# Patient Record
Sex: Female | Born: 1935 | Race: White | Hispanic: No | Smoking: Never smoker
Health system: Southern US, Community
[De-identification: ages and names within clinical notes are randomized; demographics above are authoritative.]

## PROBLEM LIST (undated history)

## (undated) DIAGNOSIS — K922 Gastrointestinal hemorrhage, unspecified: Secondary | ICD-10-CM

## (undated) DIAGNOSIS — D649 Anemia, unspecified: Secondary | ICD-10-CM

## (undated) DIAGNOSIS — I499 Cardiac arrhythmia, unspecified: Secondary | ICD-10-CM

## (undated) DIAGNOSIS — K5909 Other constipation: Secondary | ICD-10-CM

## (undated) DIAGNOSIS — I509 Heart failure, unspecified: Secondary | ICD-10-CM

## (undated) DIAGNOSIS — R41841 Cognitive communication deficit: Secondary | ICD-10-CM

## (undated) DIAGNOSIS — E785 Hyperlipidemia, unspecified: Secondary | ICD-10-CM

## (undated) DIAGNOSIS — H409 Unspecified glaucoma: Secondary | ICD-10-CM

## (undated) DIAGNOSIS — M549 Dorsalgia, unspecified: Secondary | ICD-10-CM

## (undated) DIAGNOSIS — G459 Transient cerebral ischemic attack, unspecified: Secondary | ICD-10-CM

## (undated) DIAGNOSIS — E039 Hypothyroidism, unspecified: Secondary | ICD-10-CM

## (undated) DIAGNOSIS — E079 Disorder of thyroid, unspecified: Secondary | ICD-10-CM

## (undated) DIAGNOSIS — I10 Essential (primary) hypertension: Secondary | ICD-10-CM

## (undated) DIAGNOSIS — Z5189 Encounter for other specified aftercare: Secondary | ICD-10-CM

## (undated) DIAGNOSIS — R55 Syncope and collapse: Secondary | ICD-10-CM

## (undated) DIAGNOSIS — G8929 Other chronic pain: Secondary | ICD-10-CM

## (undated) HISTORY — PX: COLONOSCOPY: SHX5424

## (undated) HISTORY — PX: CATARACT EXTRACTION, BILATERAL: SHX1313

---

## 1999-02-06 ENCOUNTER — Encounter: Admission: RE | Admit: 1999-02-06 | Discharge: 1999-02-06 | Payer: Self-pay | Admitting: Internal Medicine

## 1999-02-06 ENCOUNTER — Encounter: Payer: Self-pay | Admitting: Internal Medicine

## 1999-11-17 ENCOUNTER — Emergency Department (HOSPITAL_COMMUNITY): Admission: EM | Admit: 1999-11-17 | Discharge: 1999-11-17 | Payer: Self-pay | Admitting: Emergency Medicine

## 2000-02-18 ENCOUNTER — Encounter: Payer: Self-pay | Admitting: *Deleted

## 2000-02-18 ENCOUNTER — Emergency Department (HOSPITAL_COMMUNITY): Admission: EM | Admit: 2000-02-18 | Discharge: 2000-02-18 | Payer: Self-pay | Admitting: *Deleted

## 2000-05-20 ENCOUNTER — Other Ambulatory Visit: Admission: RE | Admit: 2000-05-20 | Discharge: 2000-05-20 | Payer: Self-pay | Admitting: Obstetrics and Gynecology

## 2007-05-28 ENCOUNTER — Encounter: Admission: RE | Admit: 2007-05-28 | Discharge: 2007-05-28 | Payer: Self-pay | Admitting: Family Medicine

## 2007-06-06 ENCOUNTER — Encounter: Admission: RE | Admit: 2007-06-06 | Discharge: 2007-06-06 | Payer: Self-pay | Admitting: Family Medicine

## 2007-12-01 ENCOUNTER — Emergency Department (HOSPITAL_COMMUNITY): Admission: EM | Admit: 2007-12-01 | Discharge: 2007-12-01 | Payer: Self-pay | Admitting: Emergency Medicine

## 2007-12-02 ENCOUNTER — Encounter: Admission: RE | Admit: 2007-12-02 | Discharge: 2007-12-02 | Payer: Self-pay | Admitting: Family Medicine

## 2008-04-02 ENCOUNTER — Inpatient Hospital Stay (HOSPITAL_COMMUNITY): Admission: RE | Admit: 2008-04-02 | Discharge: 2008-04-04 | Payer: Self-pay | Admitting: Gastroenterology

## 2008-04-03 ENCOUNTER — Ambulatory Visit: Payer: Self-pay | Admitting: Gastroenterology

## 2008-06-11 ENCOUNTER — Encounter: Admission: RE | Admit: 2008-06-11 | Discharge: 2008-06-11 | Payer: Self-pay | Admitting: Family Medicine

## 2008-06-24 ENCOUNTER — Encounter: Admission: RE | Admit: 2008-06-24 | Discharge: 2008-06-24 | Payer: Self-pay | Admitting: Family Medicine

## 2009-08-29 ENCOUNTER — Emergency Department (HOSPITAL_COMMUNITY): Admission: EM | Admit: 2009-08-29 | Discharge: 2009-08-29 | Payer: Self-pay | Admitting: Emergency Medicine

## 2010-05-04 LAB — BASIC METABOLIC PANEL
BUN: 6 mg/dL (ref 6–23)
CO2: 31 mEq/L (ref 19–32)
Calcium: 7.9 mg/dL — ABNORMAL LOW (ref 8.4–10.5)
Calcium: 8 mg/dL — ABNORMAL LOW (ref 8.4–10.5)
Chloride: 107 mEq/L (ref 96–112)
Creatinine, Ser: 0.59 mg/dL (ref 0.4–1.2)
Creatinine, Ser: 0.64 mg/dL (ref 0.4–1.2)
GFR calc Af Amer: >60 mL/min (ref >60)
GFR calc Af Amer: >60 mL/min (ref >60)
GFR calc non Af Amer: >60 mL/min (ref >60)
Glucose, Bld: 128 mg/dL — ABNORMAL HIGH (ref 70–99)
Glucose, Bld: 97 mg/dL (ref 70–99)
Potassium: 3.1 mEq/L — ABNORMAL LOW (ref 3.5–5.1)
Sodium: 137 mEq/L (ref 135–145)
Sodium: 138 mEq/L (ref 135–145)

## 2010-05-04 LAB — CBC
HCT: 24.1 % — ABNORMAL LOW (ref 36.0–46.0)
HCT: 26.7 % — ABNORMAL LOW (ref 36.0–46.0)
HCT: 28.5 % — ABNORMAL LOW (ref 36.0–46.0)
Hemoglobin: 8.2 g/dL — ABNORMAL LOW (ref 12.0–15.0)
Hemoglobin: 8.5 g/dL — ABNORMAL LOW (ref 12.0–15.0)
Hemoglobin: 9.1 g/dL — ABNORMAL LOW (ref 12.0–15.0)
MCHC: 34.1 g/dL (ref 30.0–36.0)
MCV: 84.4 fL (ref 78.0–100.0)
MCV: 84.5 fL (ref 78.0–100.0)
MCV: 84.6 fL (ref 78.0–100.0)
Platelets: 146 10*3/uL — ABNORMAL LOW (ref 150–400)
Platelets: 159 10*3/uL (ref 150–400)
Platelets: 191 10*3/uL (ref 150–400)
RBC: 2.85 MIL/uL — ABNORMAL LOW (ref 3.87–5.11)
RBC: 2.87 MIL/uL — ABNORMAL LOW (ref 3.87–5.11)
RBC: 2.97 MIL/uL — ABNORMAL LOW (ref 3.87–5.11)
RBC: 3.18 MIL/uL — ABNORMAL LOW (ref 3.87–5.11)
RBC: 3.4 MIL/uL — ABNORMAL LOW (ref 3.87–5.11)
RDW: 14.3 % (ref 11.5–15.5)
RDW: 14.4 % (ref 11.5–15.5)
WBC: 5 10*3/uL (ref 4.0–10.5)
WBC: 5.9 10*3/uL (ref 4.0–10.5)
WBC: 6 10*3/uL (ref 4.0–10.5)
WBC: 6 10*3/uL (ref 4.0–10.5)
WBC: 6.3 10*3/uL (ref 4.0–10.5)

## 2010-05-04 LAB — CROSSMATCH: Antibody Screen: NEGATIVE

## 2010-05-04 LAB — ABO/RH: ABO/RH(D): O POS

## 2010-10-24 LAB — COMPREHENSIVE METABOLIC PANEL
ALT: 17
Alkaline Phosphatase: 82
CO2: 32
GFR calc non Af Amer: 58 — ABNORMAL LOW
Glucose, Bld: 118 — ABNORMAL HIGH
Potassium: 3.7
Sodium: 138

## 2010-10-24 LAB — URINALYSIS, ROUTINE W REFLEX MICROSCOPIC
Bilirubin Urine: NEGATIVE
Nitrite: NEGATIVE
Specific Gravity, Urine: 1.005
pH: 6

## 2010-10-24 LAB — DIFFERENTIAL
Basophils Relative: 1
Eosinophils Absolute: 0.1
Neutrophils Relative %: 67

## 2010-10-24 LAB — CBC
Hemoglobin: 13
RBC: 4.65

## 2011-11-20 HISTORY — PX: JOINT REPLACEMENT: SHX530

## 2011-12-23 ENCOUNTER — Emergency Department (HOSPITAL_COMMUNITY)
Admission: EM | Admit: 2011-12-23 | Discharge: 2011-12-23 | Disposition: A | Discharge status: Home / Self Care | Payer: Medicare Other | Attending: Emergency Medicine | Admitting: Emergency Medicine

## 2011-12-23 ENCOUNTER — Emergency Department (HOSPITAL_COMMUNITY): Payer: Medicare Other

## 2011-12-23 ENCOUNTER — Encounter (HOSPITAL_COMMUNITY): Payer: Self-pay | Admitting: Emergency Medicine

## 2011-12-23 DIAGNOSIS — M79609 Pain in unspecified limb: Secondary | ICD-10-CM

## 2011-12-23 DIAGNOSIS — G8918 Other acute postprocedural pain: Secondary | ICD-10-CM | POA: Insufficient documentation

## 2011-12-23 DIAGNOSIS — Z96659 Presence of unspecified artificial knee joint: Secondary | ICD-10-CM | POA: Insufficient documentation

## 2011-12-23 DIAGNOSIS — M25569 Pain in unspecified knee: Secondary | ICD-10-CM | POA: Insufficient documentation

## 2011-12-23 HISTORY — DX: Encounter for other specified aftercare: Z51.89

## 2011-12-23 LAB — CBC WITH DIFFERENTIAL/PLATELET
Basophils Absolute: 0.1 10*3/uL (ref 0.0–0.1)
Basophils Relative: 1 % (ref 0–1)
Lymphocytes Relative: 19 % (ref 12–46)
MCHC: 32.7 g/dL (ref 30.0–36.0)
Neutro Abs: 5.3 10*3/uL (ref 1.7–7.7)
Neutrophils Relative %: 71 % (ref 43–77)
RDW: 14.3 % (ref 11.5–15.5)
WBC: 7.4 10*3/uL (ref 4.0–10.5)

## 2011-12-23 LAB — SEDIMENTATION RATE: Sed Rate: 34 mm/hr — ABNORMAL HIGH (ref 0–22)

## 2011-12-23 MED ORDER — OXYCODONE-ACETAMINOPHEN 5-325 MG PO TABS: 1.0000 | ORAL_TABLET | Freq: Four times a day (QID) | ORAL | Status: DC | PRN

## 2011-12-23 NOTE — ED Notes (Signed)
md at bedside  Pt alert and oriented x4. Respirations even and unlabored, bilateral symmetrical rise and fall of chest. Skin warm and dry. In no acute distress. Denies needs.   

## 2011-12-23 NOTE — ED Notes (Signed)
Pt escorted to discharge window. Pt verbalized understanding discharge instructions. In no acute distress.  

## 2011-12-23 NOTE — ED Provider Notes (Signed)
History     CSN: [id number]  Arrival date & time 12/23/11  9562   First MD Initiated Contact with Patient 12/23/11 [phone]      Chief Complaint  Patient presents with  . Post-op Problem    (Consider location/radiation/quality/duration/timing/severity/associated sxs/prior treatment) The history is provided by the patient.   patient presents with right lower extremity pain. She had a left total knee replacement in [place] the end of October. She's been uneventful and mandatory since. She went to the movies on Friday and noticed that her stockings rolled onto the lobe in the knee. She states it was red in that area. She's had pain since then. No fevers. No cough. No trouble breathing. No trauma. She did not twist her knee. No swelling of the leg. There is some redness of the knee area. All she is taking for it has been Tylenol. The pain is worse with movement and resolves with rest.  Past Medical History  Diagnosis Date  . Blood transfusion without reported diagnosis     Past Surgical History  Procedure Date  . Joint replacement 11/20/2011    LTKR    History reviewed. No pertinent family history.  History  Substance Use Topics  . Smoking status: Never Smoker   . Smokeless tobacco: Never Used  . Alcohol Use: No    OB History    Grav Para Term Preterm Abortions TAB SAB Ect Mult Living                  Review of Systems  Constitutional: Negative for activity change and appetite change.  HENT: Negative for neck stiffness.   Eyes: Negative for pain.  Respiratory: Negative for chest tightness and shortness of breath.   Cardiovascular: Negative for chest pain and leg swelling.  Gastrointestinal: Negative for nausea, vomiting, abdominal pain and diarrhea.  Genitourinary: Negative for flank pain.  Musculoskeletal: Negative for myalgias, back pain and gait problem.  Skin: Positive for color change. Negative for rash.  Neurological: Negative for weakness, numbness and  headaches.  Psychiatric/Behavioral: Negative for behavioral problems.    Allergies  Review of patient's allergies indicates no known allergies.  Home Medications   Current Outpatient Rx  Name  Route  Sig  Dispense  Refill  . ACETAMINOPHEN 500 MG PO TABS   Oral   Take 1,000 mg by mouth 2 (two) times daily. For pain         . CALCIUM CARBONATE-VITAMIN D 500-200 MG-UNIT PO TABS   Oral   Take 1 tablet by mouth every morning.         Marland Kitchen LATANOPROST 0.005 % OP SOLN   Both Eyes   Place 1 drop into both eyes at bedtime.         Marland Kitchen LEVOTHYROXINE SODIUM 100 MCG PO TABS   Oral   Take 100 mcg by mouth daily before breakfast.         . ADULT MULTIVITAMIN W/MINERALS CH   Oral   Take 1 tablet by mouth every morning.         Marland Kitchen TIMOLOL MALEATE 0.5 % OP SOLN   Both Eyes   Place 1 drop into both eyes 2 (two) times daily.         . OXYCODONE-ACETAMINOPHEN 5-325 MG PO TABS   Oral   Take 1-2 tablets by mouth every 6 (six) hours as needed for pain.   10 tablet   0     BP 125/72  Pulse 94  Temp  97.3 F (36.3 C) (Oral)  Resp 20  SpO2 97%  Physical Exam  Constitutional: She appears well-developed and well-nourished.  HENT:  Head: Normocephalic.  Eyes: Pupils are equal, round, and reactive to light.  Cardiovascular: Normal rate and regular rhythm.   Pulmonary/Chest: Effort normal and breath sounds normal.  Abdominal: Soft. There is no tenderness.  Musculoskeletal:       Range of motion intact in left knee. Mild erythematous skin without induration. Mild pain with varus and valgus strain. Capillary refill is symmetric on feet. Dorsalis pedis pulse palpated. Sensation intact. The 2 small areas of ecchymosis proximal to the left knee medially. No tenderness to knee.  Skin: Skin is warm.    ED Course  Procedures (including critical care time)  Labs Reviewed  SEDIMENTATION RATE - Abnormal; Notable for the following:    Sed Rate 34 (*)     All other components within  normal limits  CBC WITH DIFFERENTIAL   Dg Knee Complete 4 Views Left  12/23/2011  *RADIOLOGY REPORT*  Clinical Data: 1 month post left total knee arthroplasty, presenting with medial left knee pain.  LEFT KNEE - COMPLETE 4+ VIEW  Comparison: Preoperative left knee x-rays 06/24/2008, 05/13/2006.  Findings: Left total knee arthroplasty with anatomic alignment.  No evidence of prosthetic loosening.  No evidence of acute fracture. Small joint effusion.  IMPRESSION: Anatomic alignment post left total knee arthroplasty without complicating features.  No acute osseous abnormality.  Small joint effusion.   Original Report Authenticated By: Hulan Saas, M.D.      1. Knee pain       MDM  Patient presents with pain to her replaced left knee. No fevers. No white count. Her sedimentation rate is mildly elevated. Joint is not irritable. Negative Doppler. X-ray is reassuring. Patient be discharged with pain meds to follow with her orthopedist as needed        Juliet Rude. Rubin Payor, MD 12/23/11 1134

## 2011-12-23 NOTE — Progress Notes (Signed)
VASCULAR LAB PRELIMINARY  PRELIMINARY  PRELIMINARY  PRELIMINARY  Left lower extremity venous Doppler completed.    Preliminary report:  There is no obvious evidence of DVT or SVT noted in the left lower extremity.  Geraldin Habermehl, 12/23/2011, 10:07 AM

## 2011-12-23 NOTE — ED Notes (Signed)
Pt presents w/ left knee pain, post-op left TKR on 10/29. Uneventful post-op period, went to theater on Friday, TED stocking rolled down to below left knee and now has unbearable pain to inner aspect of left knee. Feels nauseated d/t pain and inability to sleep. Denies chest pain or shortness of breath.

## 2012-02-02 ENCOUNTER — Encounter (HOSPITAL_COMMUNITY): Payer: Self-pay | Admitting: *Deleted

## 2012-02-02 ENCOUNTER — Emergency Department (HOSPITAL_COMMUNITY)
Admission: EM | Admit: 2012-02-02 | Discharge: 2012-02-03 | Disposition: A | Discharge status: Home / Self Care | Payer: Medicare Other | Attending: Emergency Medicine | Admitting: Emergency Medicine

## 2012-02-02 DIAGNOSIS — R55 Syncope and collapse: Secondary | ICD-10-CM | POA: Insufficient documentation

## 2012-02-02 DIAGNOSIS — Z79899 Other long term (current) drug therapy: Secondary | ICD-10-CM | POA: Insufficient documentation

## 2012-02-02 DIAGNOSIS — R112 Nausea with vomiting, unspecified: Secondary | ICD-10-CM | POA: Insufficient documentation

## 2012-02-02 LAB — POCT I-STAT TROPONIN I: Troponin i, poc: 0 ng/mL (ref 0.00–0.08)

## 2012-02-02 LAB — POCT I-STAT, CHEM 8
BUN: 17 mg/dL (ref 6–23)
Chloride: 105 mEq/L (ref 96–112)
HCT: 34 % — ABNORMAL LOW (ref 36.0–46.0)
Potassium: 3.5 mEq/L (ref 3.5–5.1)
Sodium: 141 mEq/L (ref 135–145)

## 2012-02-02 MED ORDER — SODIUM CHLORIDE 0.9 % IV BOLUS (SEPSIS)
1000.0000 mL | Freq: Once | INTRAVENOUS | Status: AC
  Administered 2012-02-02: 1000 mL via INTRAVENOUS

## 2012-02-02 MED ORDER — ONDANSETRON HCL 4 MG/2ML IJ SOLN
4.0000 mg | Freq: Once | INTRAMUSCULAR | Status: AC
  Administered 2012-02-02: 4 mg via INTRAVENOUS
  Filled 2012-02-02: qty 2

## 2012-02-02 MED ORDER — SODIUM CHLORIDE 0.9 % IV SOLN
INTRAVENOUS | Status: DC
  Administered 2012-02-02: 22:00:00 via INTRAVENOUS

## 2012-02-02 NOTE — ED Notes (Signed)
Pt had a syncopal episode at church. RN on scene said pt was unresponsive for about 5 seconds. EMS states that pt was A&Ox4 when they arrived. Pt initial BP was elevated but throughout transport pt BP dropping. Pt vomited in route and was given 4 mg of zofran. Pt also diaphroetic upon EMS arrival

## 2012-02-02 NOTE — ED Notes (Signed)
At first, EKG was unattainable due to equipment mafunction. It was successful using the portable EKG monitor. EKG given to Dr. Fonnie Jarvis and copy placed in pt chart.

## 2012-02-02 NOTE — ED Provider Notes (Signed)
History     CSN: [id number]  Arrival date & time 02/02/12  2105   First MD Initiated Contact with Patient 02/02/12 2111      Chief Complaint  Patient presents with  . Loss of Consciousness    (Consider location/radiation/quality/duration/timing/severity/associated sxs/prior treatment) HPI This 79 year old was at church this evening when she gradual onset of feeling lightheaded like she was going to faint, she felt warm nauseated sweaty and knew she was going to pass out, she had brief witnessed atraumatic syncope for several seconds, she had no seizure-like activity, she had no chest pain palpations or shortness breath, she woke up with no trauma, she had no headache neck pain back pain chest pain shortness of breath palpitations. She is no focal weakness numbness or incoordination and no change in speech vision swallowing or understanding. She feels much better but still has some nausea after receiving Zofran from EMS, she did vomit once after waking up. She did not have sudden syncope without warning. Her episode occurred just prior to arrival. Past Medical History  Diagnosis Date  . Blood transfusion without reported diagnosis     Past Surgical History  Procedure Date  . Joint replacement 11/20/2011    LTKR    History reviewed. No pertinent family history.  History  Substance Use Topics  . Smoking status: Never Smoker   . Smokeless tobacco: Never Used  . Alcohol Use: No    OB History    Grav Para Term Preterm Abortions TAB SAB Ect Mult Living                  Review of Systems 10 Systems reviewed and are negative for acute change except as noted in the HPI. Allergies  Review of patient's allergies indicates no known allergies.  Home Medications   Current Outpatient Rx  Name  Route  Sig  Dispense  Refill  . ACETAMINOPHEN 500 MG PO TABS   Oral   Take 1,000 mg by mouth 2 (two) times daily. For pain         . CALCIUM CARBONATE-VITAMIN D 500-200 MG-UNIT PO  TABS   Oral   Take 1 tablet by mouth every morning.         Marland Kitchen LATANOPROST 0.005 % OP SOLN   Both Eyes   Place 1 drop into both eyes at bedtime.         Marland Kitchen LEVOTHYROXINE SODIUM 100 MCG PO TABS   Oral   Take 100 mcg by mouth daily before breakfast.         . ADULT MULTIVITAMIN W/MINERALS CH   Oral   Take 1 tablet by mouth every morning.         Marland Kitchen ONDANSETRON HCL 8 MG PO TABS   Oral   Take 8 mg by mouth every 8 (eight) hours as needed. For nausea         . TIMOLOL MALEATE 0.5 % OP SOLN   Both Eyes   Place 1 drop into both eyes 2 (two) times daily.           BP 111/59  Pulse 82  Temp 97.5 F (36.4 C) (Oral)  Resp 16  SpO2 96%  Physical Exam  Nursing note and vitals reviewed. Constitutional:       Awake, alert, nontoxic appearance with baseline speech for patient.  HENT:  Head: Atraumatic.  Mouth/Throat: No oropharyngeal exudate.  Eyes: EOM are normal. Pupils are equal, round, and reactive to light. Right eye exhibits  no discharge. Left eye exhibits no discharge.  Neck: Neck supple.  Cardiovascular: Normal rate and regular rhythm.   No murmur heard. Pulmonary/Chest: Effort normal and breath sounds normal. No stridor. No respiratory distress. She has no wheezes. She has no rales. She exhibits no tenderness.  Abdominal: Soft. Bowel sounds are normal. She exhibits no mass. There is no tenderness. There is no rebound.  Musculoskeletal: She exhibits no tenderness.       Baseline ROM, moves extremities with no obvious new focal weakness.  Lymphadenopathy:    She has no cervical adenopathy.  Neurological: She is alert.       Awake, alert, cooperative and aware of situation; motor strength bilaterally; sensation normal to light touch bilaterally; peripheral visual fields full to confrontation; no facial asymmetry; tongue midline; major cranial nerves appear intact; no pronator drift, normal finger to nose bilaterally  Skin: No rash noted.  Psychiatric: She has a  normal mood and affect.    ED Course  Procedures (including critical care time) ECG: Normal sinus rhythm, ventricular rate 85, normal axis, prolonged QTC at 502 ms, no acute ischemic changes noted, artifact present, no comparison ECG immediately available Labs Reviewed  POCT I-STAT, CHEM 8 - Abnormal; Notable for the following:    Glucose, Bld 126 (*)     Hemoglobin 11.6 (*)     HCT 34.0 (*)     All other components within normal limits  POCT I-STAT TROPONIN I  POCT I-STAT TROPONIN I  LAB REPORT - SCANNED   No results found.   1. Syncope       MDM  Patient / Family / Caregiver informed of clinical course, understand medical decision-making process, and agree with plan.  I doubt any other EMC precluding discharge at this time including, but not necessarily limited to the following:Vtach, brady-dysrhythmia.         Hurman Horn, MD 02/03/12 [phone]

## 2012-02-03 LAB — POCT I-STAT TROPONIN I: Troponin i, poc: 0 ng/mL (ref 0.00–0.08)

## 2012-08-06 ENCOUNTER — Emergency Department (HOSPITAL_COMMUNITY): Payer: Medicare Other

## 2012-08-06 ENCOUNTER — Encounter (HOSPITAL_COMMUNITY): Payer: Self-pay | Admitting: Emergency Medicine

## 2012-08-06 ENCOUNTER — Emergency Department (HOSPITAL_COMMUNITY)
Admission: EM | Admit: 2012-08-06 | Discharge: 2012-08-06 | Disposition: A | Discharge status: Home / Self Care | Payer: Medicare Other | Attending: Emergency Medicine | Admitting: Emergency Medicine

## 2012-08-06 DIAGNOSIS — S335XXA Sprain of ligaments of lumbar spine, initial encounter: Secondary | ICD-10-CM | POA: Insufficient documentation

## 2012-08-06 DIAGNOSIS — Y939 Activity, unspecified: Secondary | ICD-10-CM | POA: Insufficient documentation

## 2012-08-06 DIAGNOSIS — S39012A Strain of muscle, fascia and tendon of lower back, initial encounter: Secondary | ICD-10-CM

## 2012-08-06 DIAGNOSIS — S79919A Unspecified injury of unspecified hip, initial encounter: Secondary | ICD-10-CM | POA: Insufficient documentation

## 2012-08-06 DIAGNOSIS — X58XXXA Exposure to other specified factors, initial encounter: Secondary | ICD-10-CM | POA: Insufficient documentation

## 2012-08-06 DIAGNOSIS — Y929 Unspecified place or not applicable: Secondary | ICD-10-CM | POA: Insufficient documentation

## 2012-08-06 DIAGNOSIS — Z79899 Other long term (current) drug therapy: Secondary | ICD-10-CM | POA: Insufficient documentation

## 2012-08-06 LAB — BASIC METABOLIC PANEL
BUN: 15 mg/dL (ref 6–23)
CO2: 31 mEq/L (ref 19–32)
Calcium: 9.4 mg/dL (ref 8.4–10.5)
Chloride: 97 mEq/L (ref 96–112)
Creatinine, Ser: 0.7 mg/dL (ref 0.50–1.10)
GFR calc Af Amer: >90 mL/min (ref >90)
GFR calc non Af Amer: 79 mL/min — ABNORMAL LOW (ref >90)
Glucose, Bld: 107 mg/dL — ABNORMAL HIGH (ref 70–99)
Potassium: 3.7 mEq/L (ref 3.5–5.1)
Sodium: 136 mEq/L (ref 135–145)

## 2012-08-06 LAB — URINALYSIS, ROUTINE W REFLEX MICROSCOPIC
Bilirubin Urine: NEGATIVE
Glucose, UA: NEGATIVE mg/dL
Hgb urine dipstick: NEGATIVE
Ketones, ur: NEGATIVE mg/dL
Nitrite: NEGATIVE
Protein, ur: NEGATIVE mg/dL
Specific Gravity, Urine: 1.017 (ref 1.005–1.030)
Urobilinogen, UA: 0.2 mg/dL (ref 0.0–1.0)
pH: 5.5 (ref 5.0–8.0)

## 2012-08-06 LAB — CBC
HCT: 37.3 % (ref 36.0–46.0)
Hemoglobin: 12.4 g/dL (ref 12.0–15.0)
MCH: 27.9 pg (ref 26.0–34.0)
MCHC: 33.2 g/dL (ref 30.0–36.0)
MCV: 84 fL (ref 78.0–100.0)
Platelets: 180 10*3/uL (ref 150–400)
RBC: 4.44 MIL/uL (ref 3.87–5.11)
RDW: 13.5 % (ref 11.5–15.5)
WBC: 8.8 10*3/uL (ref 4.0–10.5)

## 2012-08-06 LAB — URINE MICROSCOPIC-ADD ON

## 2012-08-06 MED ORDER — HYDROCODONE-ACETAMINOPHEN 5-325 MG PO TABS: 1.0000 | ORAL_TABLET | Freq: Four times a day (QID) | ORAL | Status: DC | PRN

## 2012-08-06 MED ORDER — IBUPROFEN 800 MG PO TABS: 800.0000 mg | ORAL_TABLET | Freq: Three times a day (TID) | ORAL | Status: DC | PRN

## 2012-08-06 NOTE — ED Notes (Signed)
Pt states she is not having any hip or back pain unless she moves. Pt resting quietly in bed with family at bedside.

## 2012-08-06 NOTE — ED Notes (Signed)
PA Lawyer at bedside.  

## 2012-08-06 NOTE — ED Notes (Signed)
Pt complains of pain to right hip ans back pain x 3 days. Pt also complains of nausea at this time.

## 2012-08-07 NOTE — ED Provider Notes (Signed)
History    CSN: [id number] Arrival date & time 08/06/12  1654  First MD Initiated Contact with Patient 08/06/12 1733     Chief Complaint  Patient presents with  . Hip Pain   (Consider location/radiation/quality/duration/timing/severity/associated sxs/prior Treatment) HPI Patient presents to the emergency department with right lower back pain that radiates to her hip.  Patient, states this started 4 days, ago.  Patient denies numbness, weakness, nausea, vomiting, abdominal pain, fever, chest pain, shortness of breath, headache, rash or syncope.  Patient, states she was taking tramadol without relief of her pain.  Patient, states, that movement makes her pain, worse.  Patient, states nothing seems to make her pain, better.  Patient, states her symptoms have been constant Past Medical History  Diagnosis Date  . Blood transfusion without reported diagnosis    Past Surgical History  Procedure Laterality Date  . Joint replacement  11/20/2011    LTKR   No family history on file. History  Substance Use Topics  . Smoking status: Never Smoker   . Smokeless tobacco: Never Used  . Alcohol Use: No   OB History   Grav Para Term Preterm Abortions TAB SAB Ect Mult Living                 Review of Systems All other systems negative except as documented in the HPI. All pertinent positives and negatives as reviewed in the HPI. Allergies  Codeine  Home Medications   Current Outpatient Rx  Name  Route  Sig  Dispense  Refill  . calcium-vitamin D (OSCAL WITH D) 500-200 MG-UNIT per tablet   Oral   Take 1 tablet by mouth every morning.         . latanoprost (XALATAN) 0.005 % ophthalmic solution   Both Eyes   Place 1 drop into both eyes at bedtime.         Marland Kitchen levothyroxine (SYNTHROID, LEVOTHROID) 100 MCG tablet   Oral   Take 100 mcg by mouth daily before breakfast.         . Multiple Vitamin (MULTIVITAMIN WITH MINERALS) TABS   Oral   Take 1 tablet by mouth every morning.        . timolol (TIMOPTIC) 0.5 % ophthalmic solution   Both Eyes   Place 1 drop into both eyes 2 (two) times daily.         . traMADol (ULTRAM) 50 MG tablet   Oral   Take 50 mg by mouth every 6 (six) hours as needed for pain.         Marland Kitchen HYDROcodone-acetaminophen (NORCO/VICODIN) 5-325 MG per tablet   Oral   Take 1 tablet by mouth every 6 (six) hours as needed for pain.   15 tablet   0   . ibuprofen (ADVIL,MOTRIN) 800 MG tablet   Oral   Take 1 tablet (800 mg total) by mouth every 8 (eight) hours as needed for pain.   21 tablet   0    BP 131/81  Pulse 96  Temp(Src) 98.1 F (36.7 C) (Oral)  Resp 20  SpO2 96% Physical Exam  Constitutional: She is oriented to person, place, and time. She appears well-developed and well-nourished.  HENT:  Head: Normocephalic and atraumatic.  Mouth/Throat: Oropharynx is clear and moist.  Eyes: Pupils are equal, round, and reactive to light.  Cardiovascular: Normal rate and normal heart sounds.   Pulmonary/Chest: Effort normal and breath sounds normal.  Musculoskeletal:       Lumbar back: She  exhibits tenderness and pain. She exhibits normal range of motion, no deformity and no spasm.       Back:       Legs: Neurological: She is alert and oriented to person, place, and time. She has normal reflexes. She exhibits normal muscle tone. Coordination normal.    ED Course  Procedures (including critical care time) Labs Reviewed  URINALYSIS, ROUTINE W REFLEX MICROSCOPIC - Abnormal; Notable for the following:    Leukocytes, UA SMALL (*)    All other components within normal limits  BASIC METABOLIC PANEL - Abnormal; Notable for the following:    Glucose, Bld 107 (*)    GFR calc non Af Amer 79 (*)    All other components within normal limits  CBC  URINE MICROSCOPIC-ADD ON   Dg Lumbar Spine Complete  08/06/2012   *RADIOLOGY REPORT*  Clinical Data: Hip pain  LUMBAR SPINE - COMPLETE 4+ VIEW  Comparison: None.  Findings: There is a normal alignment  of the lumbar spine.  A compression fracture involves the L3 vertebra.  There is loss of approximately 50% of the vertebral body height.  Mild superior endplate compression deformity involves the L4 vertebra.  This is age indeterminate.  There may also be a compression fracture involving the T8 vertebra. This is also age indeterminate.  IMPRESSION:  1.  The predominant finding is a compression fracture involving the L 30 vertebra with loss of approximately 50% of the vertebral body height.   Mild age indeterminant compression deformities involve the T8 vertebra and L4.   Original Report Authenticated By: Signa Kell, M.D.   Dg Hip Complete Right  08/06/2012   *RADIOLOGY REPORT*  Clinical Data: Hip pain  RIGHT HIP - COMPLETE 2+ VIEW  Comparison: None  Findings: There are moderate degenerative changes involving the right hip.  Joint space narrowing, marginal spur formation and subchondral sclerosis is noted.  No fracture or subluxation identified.  No radio-opaque foreign body or soft tissue calcifications identified.  Degenerative disc disease is noted within the lumbar spine.  IMPRESSION:  1.  Osteoarthritis involves the right hip. 2.  No acute findings.   Original Report Authenticated By: Signa Kell, M.D.    patient states, that she had a compression fracture 12 years ago so she states this is not a new finding.  The patient does not have any motor or neurological deficits noted on exam.  She does have normal reflexes.  Patient is advised followup with her primary care Dr. told to use ice and heat on her lower back.  Told to return here for any worsening in her condition  MDM    Carlyle Dolly, PA-C 08/07/12 0110

## 2012-08-08 NOTE — ED Provider Notes (Signed)
Medical screening examination/treatment/procedure(s) were performed by non-physician practitioner and as supervising physician I was immediately available for consultation/collaboration.  Chamille Werntz, MD 08/08/12 1455 

## 2012-10-02 ENCOUNTER — Encounter (HOSPITAL_COMMUNITY): Payer: Self-pay | Admitting: Emergency Medicine

## 2012-10-02 ENCOUNTER — Emergency Department (HOSPITAL_COMMUNITY)
Admission: EM | Admit: 2012-10-02 | Discharge: 2012-10-02 | Disposition: A | Discharge status: Home / Self Care | Payer: Medicare Other | Attending: Emergency Medicine | Admitting: Emergency Medicine

## 2012-10-02 DIAGNOSIS — M25551 Pain in right hip: Secondary | ICD-10-CM

## 2012-10-02 DIAGNOSIS — Z79899 Other long term (current) drug therapy: Secondary | ICD-10-CM | POA: Insufficient documentation

## 2012-10-02 DIAGNOSIS — Z8781 Personal history of (healed) traumatic fracture: Secondary | ICD-10-CM | POA: Insufficient documentation

## 2012-10-02 DIAGNOSIS — M545 Low back pain, unspecified: Secondary | ICD-10-CM | POA: Insufficient documentation

## 2012-10-02 DIAGNOSIS — M25559 Pain in unspecified hip: Secondary | ICD-10-CM | POA: Insufficient documentation

## 2012-10-02 DIAGNOSIS — Z880 Allergy status to penicillin: Secondary | ICD-10-CM | POA: Insufficient documentation

## 2012-10-02 DIAGNOSIS — M549 Dorsalgia, unspecified: Secondary | ICD-10-CM

## 2012-10-02 LAB — URINALYSIS, ROUTINE W REFLEX MICROSCOPIC
Bilirubin Urine: NEGATIVE
Ketones, ur: NEGATIVE mg/dL
Nitrite: NEGATIVE
Protein, ur: NEGATIVE mg/dL
Urobilinogen, UA: 0.2 mg/dL (ref 0.0–1.0)

## 2012-10-02 LAB — URINE MICROSCOPIC-ADD ON: Urine-Other: NONE SEEN

## 2012-10-02 MED ORDER — HYDROCODONE-ACETAMINOPHEN 5-325 MG PO TABS
1.0000 | ORAL_TABLET | Freq: Once | ORAL | Status: AC
  Administered 2012-10-02: 1 via ORAL
  Filled 2012-10-02: qty 1

## 2012-10-02 MED ORDER — IBUPROFEN 400 MG PO TABS: 400.0000 mg | ORAL_TABLET | Freq: Three times a day (TID) | ORAL | Status: DC | PRN

## 2012-10-02 MED ORDER — HYDROCODONE-ACETAMINOPHEN 5-325 MG PO TABS: 1.0000 | ORAL_TABLET | Freq: Four times a day (QID) | ORAL | Status: DC | PRN

## 2012-10-02 NOTE — ED Provider Notes (Signed)
CSN: [id number]     Arrival date & time 10/02/12  1603 History   First MD Initiated Contact with Patient 10/02/12 1800     No chief complaint on file.  (Consider location/radiation/quality/duration/timing/severity/associated sxs/prior Treatment) HPI Comments: Patient with low back pain with radiation into her right hip.  Patient has a hx of remote spinal fracture 12 years ago, typically does not have pain with this.  Two months ago her pain returned, was seen in ED.  Symptoms controlled with prescribed medication and resolved. Pain returned 3 days ago.  Pain is described a gnawing, exacerbated by trying to get up from lying flat, improved with ibuprofen and hydrocodone.  Pt has been limiting her use of ibuprofen to 1 - 800mg  tablet daily because she has a hx GI bleed from NSAID overuse.  Pain is then uncontrolled when the ibuprofen wears off.  Denies fevers, chills, abdominal pain, urinary symptoms, bloody stool, bowel or bladder incontinence or retention, weakness or numbness in her legs, rash.   The history is provided by the patient.    Past Medical History  Diagnosis Date  . Blood transfusion without reported diagnosis    Past Surgical History  Procedure Laterality Date  . Joint replacement  11/20/2011    LTKR   No family history on file. History  Substance Use Topics  . Smoking status: Never Smoker   . Smokeless tobacco: Never Used  . Alcohol Use: No   OB History   Grav Para Term Preterm Abortions TAB SAB Ect Mult Living                 Review of Systems  Constitutional: Negative for fever.  Gastrointestinal: Negative for nausea, vomiting, abdominal pain, diarrhea and blood in stool.  Genitourinary: Negative for dysuria, urgency, frequency, hematuria and vaginal bleeding.  Musculoskeletal: Positive for back pain.  Neurological: Negative for weakness and numbness.    Allergies  Penicillins and Codeine  Home Medications   Current Outpatient Rx  Name  Route  Sig   Dispense  Refill  . calcium-vitamin D (OSCAL WITH D) 500-200 MG-UNIT per tablet   Oral   Take 1 tablet by mouth every morning.         Marland Kitchen HYDROcodone-acetaminophen (NORCO/VICODIN) 5-325 MG per tablet   Oral   Take 1 tablet by mouth every 6 (six) hours as needed for pain.   15 tablet   0   . ibuprofen (ADVIL,MOTRIN) 800 MG tablet   Oral   Take 800 mg by mouth every 8 (eight) hours as needed for pain.         Marland Kitchen latanoprost (XALATAN) 0.005 % ophthalmic solution   Both Eyes   Place 1 drop into both eyes at bedtime.         Marland Kitchen levothyroxine (SYNTHROID, LEVOTHROID) 100 MCG tablet   Oral   Take 100 mcg by mouth daily before breakfast.         . Multiple Vitamin (MULTIVITAMIN WITH MINERALS) TABS   Oral   Take 1 tablet by mouth every morning.         . timolol (TIMOPTIC) 0.5 % ophthalmic solution   Both Eyes   Place 1 drop into both eyes 2 (two) times daily.          There were no vitals taken for this visit. Physical Exam  Nursing note and vitals reviewed. Constitutional: She appears well-developed and well-nourished. No distress.  HENT:  Head: Normocephalic and atraumatic.  Neck: Neck supple.  Pulmonary/Chest: Effort normal.  Abdominal: Soft. She exhibits no distension and no mass. There is no tenderness. There is no rebound and no guarding.  Musculoskeletal:  Spine nontender, no crepitus, or stepoffs.  Lower extremities:  Strength 5/5, sensation intact, distal pulses intact.     Neurological: She is alert.  Skin: She is not diaphoretic.  Psychiatric: She has a normal mood and affect. Her behavior is normal. Thought content normal.    ED Course  Procedures (including critical care time) Labs Review Labs Reviewed  URINALYSIS, ROUTINE W REFLEX MICROSCOPIC - Abnormal; Notable for the following:    Leukocytes, UA TRACE (*)    All other components within normal limits  URINE CULTURE  URINE MICROSCOPIC-ADD ON   Imaging Review No results found.  8:17 PM Pt is  currently pain free.  Walks easily in the hallways without pain.   MDM   1. Back pain   2. Hip pain, right    Elderly patient with recurrent low back pain. Pt has remote hx of spine fracture, presented two months ago with same problem.  Pain only occurs when attempting to stand from lying down position.  She has no other concerning or new symptoms.  No red flags.  Her hip pain is likely radicular pain. Her pain is well controlled at home with ibuprofen but pt is only taking it once in the morning because of her previous GI bleed.  She does state that both ibuprofen and vicodin control her pain well - she is just concerned about coverage during the day without putting herself at risk.  No spinal tenderness and no overlying skin changes.  Neurovascularly intact.  No new imagining needed at this time (xrays done in July 2014 when symptoms first began).  There is no worsening of the symptoms. No injury.  Pt ambulates well in the hallway and is pain free after vicodin.  D/C home with neurosurgery follow up for evaluation and options for further management.  Discussed all results with patient.  Pt given return precautions.  Pt verbalizes understanding and agrees with plan.       Trixie Dredge, PA-C 10/02/12 2202

## 2012-10-02 NOTE — Progress Notes (Signed)
EDCM spoke to patient at bedside.  Patient confirms she lives by herself.  She has a daughter and a son.  She reports she has had a knee replacement in October of 2013 of which she spent some time in the rehab facility [facility].  After her knee surgery, patient reports she had home health for physical therapy in the home 2-3 times per week by [facility].  Patient reports she has a walker at home, but doesn't use it.  She reports she is able to walk, dress, and feed herself without any difficulty.  She reports her pcp is now Dr. Asencion Partridge of [facility].  [facility] asked patient if she thinks she would benefit from home health for physical therapy.  As per patient,  "I don't really think it's going to do anything."  EDCM spoke to [facility] [place] who will order order to ambulate patient.  EDCM provided patient list of home health agencies in [facility] for future use if needed.  Patient very thankful for resources.  No further needs at this time.

## 2012-10-02 NOTE — ED Notes (Signed)
Pt has ongoing back pain that usually hurts in the mornings.  Pt states that she has on old fracture in her back for 12years but doesn't want surgery to repair it.

## 2012-10-02 NOTE — ED Provider Notes (Signed)
Medical screening examination/treatment/procedure(s) were performed by non-physician practitioner and as supervising physician I was immediately available for consultation/collaboration.   Zacchary Pompei M Lexine Jaspers, DO 10/02/12 2256 

## 2012-10-03 LAB — URINE CULTURE: Culture: NO GROWTH

## 2012-10-13 ENCOUNTER — Other Ambulatory Visit: Payer: Self-pay | Admitting: Orthopedic Surgery

## 2012-10-13 DIAGNOSIS — M479 Spondylosis, unspecified: Secondary | ICD-10-CM

## 2012-10-22 ENCOUNTER — Ambulatory Visit
Admission: RE | Admit: 2012-10-22 | Discharge: 2012-10-22 | Disposition: A | Discharge status: Home / Self Care | Payer: Medicare Other | Source: Ambulatory Visit | Attending: Orthopedic Surgery | Admitting: Orthopedic Surgery

## 2012-10-22 DIAGNOSIS — M479 Spondylosis, unspecified: Secondary | ICD-10-CM

## 2013-01-22 DIAGNOSIS — R55 Syncope and collapse: Secondary | ICD-10-CM

## 2013-01-22 DIAGNOSIS — I499 Cardiac arrhythmia, unspecified: Secondary | ICD-10-CM

## 2013-01-22 DIAGNOSIS — G459 Transient cerebral ischemic attack, unspecified: Secondary | ICD-10-CM

## 2013-01-22 HISTORY — DX: Transient cerebral ischemic attack, unspecified: G45.9

## 2013-01-22 HISTORY — DX: Cardiac arrhythmia, unspecified: I49.9

## 2013-01-22 HISTORY — DX: Syncope and collapse: R55

## 2013-03-24 ENCOUNTER — Inpatient Hospital Stay (HOSPITAL_COMMUNITY)
Admission: EM | Admit: 2013-03-24 | Discharge: 2013-03-27 | DRG: 392 | Disposition: A | Discharge status: Home / Self Care | Payer: Medicare Other | Attending: Internal Medicine | Admitting: Internal Medicine

## 2013-03-24 ENCOUNTER — Encounter (HOSPITAL_COMMUNITY): Payer: Self-pay | Admitting: Emergency Medicine

## 2013-03-24 DIAGNOSIS — Z79899 Other long term (current) drug therapy: Secondary | ICD-10-CM

## 2013-03-24 DIAGNOSIS — Z885 Allergy status to narcotic agent status: Secondary | ICD-10-CM

## 2013-03-24 DIAGNOSIS — H409 Unspecified glaucoma: Secondary | ICD-10-CM | POA: Diagnosis present

## 2013-03-24 DIAGNOSIS — R000 Tachycardia, unspecified: Secondary | ICD-10-CM | POA: Diagnosis present

## 2013-03-24 DIAGNOSIS — E876 Hypokalemia: Secondary | ICD-10-CM

## 2013-03-24 DIAGNOSIS — E860 Dehydration: Secondary | ICD-10-CM | POA: Diagnosis present

## 2013-03-24 DIAGNOSIS — E039 Hypothyroidism, unspecified: Secondary | ICD-10-CM

## 2013-03-24 DIAGNOSIS — Z880 Allergy status to penicillin: Secondary | ICD-10-CM

## 2013-03-24 DIAGNOSIS — R55 Syncope and collapse: Secondary | ICD-10-CM

## 2013-03-24 DIAGNOSIS — D72829 Elevated white blood cell count, unspecified: Secondary | ICD-10-CM | POA: Diagnosis present

## 2013-03-24 DIAGNOSIS — A088 Other specified intestinal infections: Principal | ICD-10-CM | POA: Diagnosis present

## 2013-03-24 DIAGNOSIS — K5289 Other specified noninfective gastroenteritis and colitis: Secondary | ICD-10-CM

## 2013-03-24 DIAGNOSIS — K529 Noninfective gastroenteritis and colitis, unspecified: Secondary | ICD-10-CM

## 2013-03-24 HISTORY — DX: Gastrointestinal hemorrhage, unspecified: K92.2

## 2013-03-24 HISTORY — DX: Disorder of thyroid, unspecified: E07.9

## 2013-03-24 HISTORY — DX: Unspecified glaucoma: H40.9

## 2013-03-24 LAB — CBC WITH DIFFERENTIAL/PLATELET
Basophils Absolute: 0 10*3/uL (ref 0.0–0.1)
Basophils Relative: 0 % (ref 0–1)
EOS ABS: 0 10*3/uL (ref 0.0–0.7)
EOS PCT: 0 % (ref 0–5)
HEMATOCRIT: 40.1 % (ref 36.0–46.0)
Hemoglobin: 13.3 g/dL (ref 12.0–15.0)
LYMPHS ABS: 0.7 10*3/uL (ref 0.7–4.0)
LYMPHS PCT: 4 % — AB (ref 12–46)
MCH: 28.2 pg (ref 26.0–34.0)
MCHC: 33.2 g/dL (ref 30.0–36.0)
MCV: 85 fL (ref 78.0–100.0)
MONO ABS: 0.9 10*3/uL (ref 0.1–1.0)
Monocytes Relative: 6 % (ref 3–12)
Neutro Abs: 14.2 10*3/uL — ABNORMAL HIGH (ref 1.7–7.7)
Neutrophils Relative %: 89 % — ABNORMAL HIGH (ref 43–77)
PLATELETS: 195 10*3/uL (ref 150–400)
RBC: 4.72 MIL/uL (ref 3.87–5.11)
RDW: 13.6 % (ref 11.5–15.5)
WBC: 15.8 10*3/uL — AB (ref 4.0–10.5)

## 2013-03-24 LAB — COMPREHENSIVE METABOLIC PANEL
ALK PHOS: 107 U/L (ref 39–117)
ALT: 10 U/L (ref 0–35)
AST: 15 U/L (ref 0–37)
Albumin: 3.7 g/dL (ref 3.5–5.2)
BILIRUBIN TOTAL: 0.3 mg/dL (ref 0.3–1.2)
BUN: 17 mg/dL (ref 6–23)
CHLORIDE: 98 meq/L (ref 96–112)
CO2: 26 meq/L (ref 19–32)
CREATININE: 0.81 mg/dL (ref 0.50–1.10)
Calcium: 9.7 mg/dL (ref 8.4–10.5)
GFR, EST AFRICAN AMERICAN: 76 mL/min — AB (ref >90)
GFR, EST NON AFRICAN AMERICAN: 65 mL/min — AB (ref >90)
GLUCOSE: 141 mg/dL — AB (ref 70–99)
POTASSIUM: 3.6 meq/L — AB (ref 3.7–5.3)
Sodium: 139 mEq/L (ref 137–147)
Total Protein: 7.4 g/dL (ref 6.0–8.3)

## 2013-03-24 LAB — URINALYSIS, ROUTINE W REFLEX MICROSCOPIC
BILIRUBIN URINE: NEGATIVE
Glucose, UA: NEGATIVE mg/dL
KETONES UR: NEGATIVE mg/dL
Leukocytes, UA: NEGATIVE
NITRITE: NEGATIVE
PH: 5 (ref 5.0–8.0)
Protein, ur: NEGATIVE mg/dL
SPECIFIC GRAVITY, URINE: 1.02 (ref 1.005–1.030)
Urobilinogen, UA: 0.2 mg/dL (ref 0.0–1.0)

## 2013-03-24 LAB — URINE MICROSCOPIC-ADD ON

## 2013-03-24 LAB — LIPASE, BLOOD: LIPASE: 21 U/L (ref 11–59)

## 2013-03-24 MED ORDER — MORPHINE SULFATE 2 MG/ML IJ SOLN
2.0000 mg | Freq: Once | INTRAMUSCULAR | Status: DC
  Filled 2013-03-24: qty 1

## 2013-03-24 MED ORDER — SODIUM CHLORIDE 0.9 % IV BOLUS (SEPSIS)
1000.0000 mL | Freq: Once | INTRAVENOUS | Status: AC
  Administered 2013-03-24: 1000 mL via INTRAVENOUS

## 2013-03-24 MED ORDER — SODIUM CHLORIDE 0.9 % IV SOLN
INTRAVENOUS | Status: DC
  Administered 2013-03-25 – 2013-03-26 (×5): via INTRAVENOUS
  Filled 2013-03-24 (×12): qty 1000

## 2013-03-24 MED ORDER — ONDANSETRON HCL 4 MG/2ML IJ SOLN
4.0000 mg | Freq: Once | INTRAMUSCULAR | Status: AC
  Administered 2013-03-24: 4 mg via INTRAVENOUS
  Filled 2013-03-24: qty 2

## 2013-03-24 MED ORDER — DIPHENOXYLATE-ATROPINE 2.5-0.025 MG PO TABS
2.0000 | ORAL_TABLET | Freq: Once | ORAL | Status: AC
  Administered 2013-03-24: 2 via ORAL
  Filled 2013-03-24: qty 2

## 2013-03-24 NOTE — ED Notes (Signed)
MD at bedside. 

## 2013-03-24 NOTE — H&P (Signed)
[facility] History and Physical  Stephanie Donovan QZE:[id number] DOB: 11/14/1927 DOA: 03/24/2013  Referring physician: Dr. Tanna Furry PCP: [name], MD   Chief Complaint:  Nausea vomiting and diarrhea since one day  HPI:  80 year old female with history of hypothyroidism and glaucoma was brought in by EMS  with several episodes of nausea, vomiting and nonbloody watery diarrhea at home. Patient was in her usual state of health this morning when she started having several episodes of nausea and vomiting followed by watery diarrhea at home. She reports that her son lives with her had similar symptoms until 2 days back. She denies any fevers or chills. While sitting on the commode her son found her falling backwards and she feels she may have passed out for less than a minute. EMS was called and patient was brought to the hospital. She reports feeling lightheaded and dizzy. She denies similar symptoms in the past. Denies eating anything outside of being on antibiotics recently.. Patient denies headache,  fever, chills,chest pain, palpitations, SOB, abdominal pain, or urinary symptoms. Denies change in weight or appetite.  Course in the ED  Patient was tachycardic to 110s. Vitals otherwise stable. Blood work done showed leukocytosis with WBC of 15.8 thousand with normal hemoglobin, hematocrit and platelets. Chemistry showed potassium of 2.6 and anion gap of 15. Renal function was normal. Glucose is 141. Patient given 2 L IV normal saline bolus and 4 mg of IV Zofran after which her nausea and vomiting improved but the patient still had watery diarrhea. Stool for C. difficile sent.  Hospitalist consulted for admission on observation .  Review of Systems:  Constitutional: Denies fever, chills, diaphoresis, appetite change, fatigue+.  HEENT: Denies photophobia, eye pain,hearing loss, ear pain, congestion, sore throat, rhinorrhea, sneezing, mouth sores, trouble swallowing, neck pain, neck  stiffness and tinnitus.   Respiratory: Denies SOB, DOE, cough, chest tightness,  and wheezing.   Cardiovascular: Denies chest pain, palpitations and leg swelling.  Gastrointestinal:  nausea, vomiting, diarrhea, denies abdominal pain,  constipation, blood in stool and abdominal distention.  Genitourinary: Denies dysuria, urgency, frequency, hematuria, flank pain and difficulty urinating.  Endocrine: Denies hot or cold intolerance, , polyuria, polydipsia. Musculoskeletal: Denies myalgias, back pain, joint swelling, arthralgias and gait problem.  Skin: Denies pallor, rash and wound.  Neurological: Dizziness, syncope, Denies  seizures,  weakness, light-headedness, numbness and headaches.     Past Medical History  Diagnosis Date  . Blood transfusion without reported diagnosis   . Thyroid disease    Past Surgical History  Procedure Laterality Date  . Joint replacement  11/20/2011    LTKR   Social History:  reports that she has never smoked. She has never used smokeless tobacco. She reports that she does not drink alcohol or use illicit drugs.  Allergies  Allergen Reactions  . Penicillins     Lost hearing temporarily  . Codeine Palpitations    History reviewed. No pertinent family history.  Prior to Admission medications   Medication Sig Start Date End Date Taking? Authorizing Provider  calcium-vitamin D (OSCAL WITH D) 500-200 MG-UNIT per tablet Take 1 tablet by mouth every morning.   Yes Historical Provider, MD  latanoprost (XALATAN) 0.005 % ophthalmic solution Place 1 drop into both eyes at bedtime.   Yes Historical Provider, MD  levothyroxine (SYNTHROID, LEVOTHROID) 100 MCG tablet Take 100 mcg by mouth daily before breakfast.   Yes Historical Provider, MD  Multiple Vitamin (MULTIVITAMIN WITH MINERALS) TABS Take 1 tablet by mouth every morning.  Yes Historical Provider, MD  timolol (TIMOPTIC) 0.5 % ophthalmic solution Place 1 drop into both eyes 2 (two) times daily.   Yes  Historical Provider, MD     Physical Exam:  Filed Vitals:   03/24/13 1601 03/24/13 1809 03/24/13 2200  BP: 106/60 101/55 115/65  Pulse: 79 94 107  Temp: 97.4 F (36.3 C)    TempSrc: Oral    Resp: 16 16 18   SpO2: 97% 100% 95%    Constitutional: Vital signs reviewed.  Patient is an elderly female in no acute distress. HEENT: no pallor, no icterus, dry oral mucosa, no cervical lymphadenopathy Cardiovascular: S1 and S2 tachycardic, no MRG Chest: CTAB, no wheezes, rales, or rhonchi Abdominal: Soft. Non-tender, non-distended, bowel sounds are normal, no masses, organomegaly, or guarding present.  Ext: warm, no edema Neurological: A&O x3, non focal  Labs on Admission:  Basic Metabolic Panel:  Recent Labs Lab 03/24/13 1638  NA 139  K 3.6*  CL 98  CO2 26  GLUCOSE 141*  BUN 17  CREATININE 0.81  CALCIUM 9.7   Liver Function Tests:  Recent Labs Lab 03/24/13 1638  AST 15  ALT 10  ALKPHOS 107  BILITOT 0.3  PROT 7.4  ALBUMIN 3.7    Recent Labs Lab 03/24/13 1605  LIPASE 21   No results found for this basename: AMMONIA,  in the last 168 hours CBC:  Recent Labs Lab 03/24/13 1605  WBC 15.8*  NEUTROABS 14.2*  HGB 13.3  HCT 40.1  MCV 85.0  PLT 195   Cardiac Enzymes: No results found for this basename: CKTOTAL, CKMB, CKMBINDEX, TROPONINI,  in the last 168 hours BNP: No components found with this basename: POCBNP,  CBG: No results found for this basename: GLUCAP,  in the last 168 hours  Radiological Exams on Admission: No results found.  EKG: Sinus rhythm at 76, multiple PVCs, no ST-T changes  Assessment/Plan  Principal problem Acute gastroenteritis Possibly vital. Admit to telemetry given persistent tachycardia and possible syncope. -Patient has received 2 L IV normal saline bolus in the ED but is still tachycardic. I would continue her on IV normal saline at 125 cc an hour. -Zofran when necessary for nausea and vomiting. -Check stool for C.  Difficile. Hold antibiotics at this time.  Syncope Possibly vasovagal versus orthostatic. Check EKG. Monitor on telemetry overnight.  Hypokalemia Plan admit IV fluids. Check magnesium level  Hypothyroidism Continue Synthroid. Check TSH  Glaucoma Continue home eyedrops  Diet: N.p.o. except ice chips  DVT prophylaxis: sq lovenox   Code Status: full code Family Communication: None at bedside Disposition Plan: Home tomorrow if improved  Louellen Molder [facility] Pager [phone]  Total time spent on admission :50 minutes  If 7PM-7AM, please contact night-coverage [email or web address] Password [email or web address] 03/24/2013, 11:34 PM

## 2013-03-24 NOTE — ED Notes (Signed)
Patient has tried to urinate but can not

## 2013-03-24 NOTE — ED Notes (Signed)
Attempted to get urine from pt with an in and out cath, was unsuccessful, only one small drop came out. Notified the nurse

## 2013-03-24 NOTE — ED Notes (Signed)
Bed: WA16 Expected date:  Expected time:  Means of arrival:  Comments: EMS-N/V 

## 2013-03-24 NOTE — ED Notes (Signed)
Per EMS patient reports from home for uncontrolled diarrhea and vomiting, found in mess of said fluids, denies pain at time. EMS reports patient was hypotensive, abnormal ECG. EMS administered 4 mg Zofran and 250 mL fluid en route.

## 2013-03-24 NOTE — ED Notes (Signed)
Stephanie Donovan got the CBG

## 2013-03-24 NOTE — ED Notes (Signed)
Will check on patient to try to urinate again

## 2013-03-24 NOTE — ED Notes (Signed)
[place] and I tried doing a In and out and no urine came out. However the bed was wet before we did it. Complete bed lining was done aswell.

## 2013-03-25 ENCOUNTER — Encounter (HOSPITAL_COMMUNITY): Payer: Self-pay | Admitting: General Practice

## 2013-03-25 DIAGNOSIS — H409 Unspecified glaucoma: Secondary | ICD-10-CM

## 2013-03-25 LAB — BASIC METABOLIC PANEL
BUN: 20 mg/dL (ref 6–23)
CALCIUM: 8.2 mg/dL — AB (ref 8.4–10.5)
CO2: 22 mEq/L (ref 19–32)
Chloride: 107 mEq/L (ref 96–112)
Creatinine, Ser: 0.68 mg/dL (ref 0.50–1.10)
GFR, EST NON AFRICAN AMERICAN: 79 mL/min — AB (ref >90)
Glucose, Bld: 105 mg/dL — ABNORMAL HIGH (ref 70–99)
Potassium: 3.8 mEq/L (ref 3.7–5.3)
SODIUM: 142 meq/L (ref 137–147)

## 2013-03-25 LAB — GLUCOSE, CAPILLARY: GLUCOSE-CAPILLARY: 120 mg/dL — AB (ref 70–99)

## 2013-03-25 LAB — CBC
HCT: 33.6 % — ABNORMAL LOW (ref 36.0–46.0)
Hemoglobin: 11.1 g/dL — ABNORMAL LOW (ref 12.0–15.0)
MCH: 28.2 pg (ref 26.0–34.0)
MCHC: 33 g/dL (ref 30.0–36.0)
MCV: 85.5 fL (ref 78.0–100.0)
PLATELETS: 157 10*3/uL (ref 150–400)
RBC: 3.93 MIL/uL (ref 3.87–5.11)
RDW: 13.8 % (ref 11.5–15.5)
WBC: 9.8 10*3/uL (ref 4.0–10.5)

## 2013-03-25 LAB — MAGNESIUM: Magnesium: 1.6 mg/dL (ref 1.5–2.5)

## 2013-03-25 LAB — TSH: TSH: 0.224 u[IU]/mL — AB (ref 0.350–4.500)

## 2013-03-25 LAB — CLOSTRIDIUM DIFFICILE BY PCR: Toxigenic C. Difficile by PCR: NEGATIVE

## 2013-03-25 MED ORDER — ACETAMINOPHEN 325 MG PO TABS: 650.0000 mg | ORAL_TABLET | Freq: Four times a day (QID) | ORAL | Status: DC | PRN

## 2013-03-25 MED ORDER — ACETAMINOPHEN 650 MG RE SUPP: 650.0000 mg | Freq: Four times a day (QID) | RECTAL | Status: DC | PRN

## 2013-03-25 MED ORDER — LATANOPROST 0.005 % OP SOLN
1.0000 [drp] | Freq: Every day | OPHTHALMIC | Status: DC
  Administered 2013-03-25 – 2013-03-26 (×3): 1 [drp] via OPHTHALMIC
  Filled 2013-03-25: qty 2.5

## 2013-03-25 MED ORDER — SODIUM CHLORIDE 0.9 % IJ SOLN
3.0000 mL | Freq: Two times a day (BID) | INTRAMUSCULAR | Status: DC
  Administered 2013-03-25: 3 mL via INTRAVENOUS

## 2013-03-25 MED ORDER — LEVOTHYROXINE SODIUM 100 MCG PO TABS
100.0000 ug | ORAL_TABLET | Freq: Every day | ORAL | Status: DC
  Administered 2013-03-25 – 2013-03-27 (×3): 100 ug via ORAL
  Filled 2013-03-25 (×4): qty 1

## 2013-03-25 MED ORDER — CALCIUM CARBONATE-VITAMIN D 500-200 MG-UNIT PO TABS
1.0000 | ORAL_TABLET | Freq: Every morning | ORAL | Status: DC
  Administered 2013-03-25 – 2013-03-27 (×3): 1 via ORAL
  Filled 2013-03-25 (×3): qty 1

## 2013-03-25 MED ORDER — TIMOLOL MALEATE 0.5 % OP SOLN
1.0000 [drp] | Freq: Two times a day (BID) | OPHTHALMIC | Status: DC
  Administered 2013-03-25 – 2013-03-27 (×6): 1 [drp] via OPHTHALMIC
  Filled 2013-03-25: qty 5

## 2013-03-25 MED ORDER — BIOTENE DRY MOUTH MT LIQD
15.0000 mL | Freq: Two times a day (BID) | OROMUCOSAL | Status: DC
  Administered 2013-03-25 – 2013-03-27 (×4): 15 mL via OROMUCOSAL

## 2013-03-25 MED ORDER — ADULT MULTIVITAMIN W/MINERALS CH
1.0000 | ORAL_TABLET | Freq: Every morning | ORAL | Status: DC
  Administered 2013-03-25 – 2013-03-27 (×3): 1 via ORAL
  Filled 2013-03-25 (×3): qty 1

## 2013-03-25 MED ORDER — SODIUM CHLORIDE 0.9 % IV SOLN: INTRAVENOUS | Status: DC

## 2013-03-25 MED ORDER — ENOXAPARIN SODIUM 40 MG/0.4ML ~~LOC~~ SOLN
40.0000 mg | SUBCUTANEOUS | Status: DC
  Administered 2013-03-25 – 2013-03-27 (×3): 40 mg via SUBCUTANEOUS
  Filled 2013-03-25 (×3): qty 0.4

## 2013-03-25 MED ORDER — ONDANSETRON HCL 4 MG/2ML IJ SOLN
4.0000 mg | Freq: Four times a day (QID) | INTRAMUSCULAR | Status: DC | PRN
  Administered 2013-03-26: 4 mg via INTRAVENOUS
  Filled 2013-03-25: qty 2

## 2013-03-25 MED ORDER — ONDANSETRON HCL 4 MG PO TABS: 4.0000 mg | ORAL_TABLET | Freq: Four times a day (QID) | ORAL | Status: DC | PRN

## 2013-03-25 NOTE — Progress Notes (Signed)
Patient ID: Stephanie Donovan, female   DOB: 12-16-1927, 80 y.o.   MRN: [id number]  [facility] PROGRESS NOTE  ALAINE LOUGHNEY FIE:[id number] DOB: [birth year] DOA: 03/24/2013 PCP: [name], MD  Brief narrative: 80 year old female with history of hypothyroidism and glaucoma was brought in by EMS after having several episodes of nausea, vomiting and nonbloody watery diarrhea at home, associated with lightheadedness and dizziness. She reported that her son lives with her and had similar symptoms one week prior to this admission.   Course in the ED  Patient was tachycardic to 110s. Vitals otherwise stable. Blood work done showed leukocytosis with WBC of 15.8 with normal hemoglobin, hematocrit and platelets. Chemistry showed potassium of 2.6 and anion gap of 15. Renal function was normal. Patient given 2 L IV normal saline bolus and 4 mg of IV Zofran after which her nausea and vomiting improved.  Principal Problem:   Acute gastroenteritis - pt is clinically improving but still with diarrhea - C. Diff is negative - stool panel ordered, O&P, culture - continue supportive care with analgesia and antiemetics as needed Active Problems:   Hypothyroidism - continue synthroid    Syncope - likely from dehydration - continue IVF for now, encourage PO intake - PT evaluation    Glaucoma - continue home medical regimen    Hypokalemia - secondary to vomiting and diarrhea - supplemented and WNL this AM - repeat BMP in AM   Leukocytosis - secondary to principal problem - now resolved   Consultants:  None  Procedures/Studies:  None   Antibiotics:  None  Code Status: Full Family Communication: Pt at bedside Disposition Plan: Home when medically stable  HPI/Subjective: No events overnight.   Objective: Filed Vitals:   03/24/13 2340 03/25/13 0031 03/25/13 0035 03/25/13 0425  BP:  117/66  109/52  Pulse:  138  97  Temp: 98.8 F (37.1 C) 98.8 F (37.1 C)  98.6 F (37  C)  TempSrc: Oral Oral  Oral  Resp:  18  18  Height:   5\' 2"  (1.575 m)   Weight:   68.7 kg (151 lb 7.3 oz)   SpO2:  90%  95%    Intake/Output Summary (Last 24 hours) at 03/25/13 0903 Last data filed at 03/25/13 0600  Gross per 24 hour  Intake   3750 ml  Output    300 ml  Net   3450 ml    Exam:   General:  Pt is alert, follows commands appropriately, not in acute distress  Cardiovascular: Regular rate and rhythm, S1/S2, no murmurs, no rubs, no gallops  Respiratory: Clear to auscultation bilaterally, no wheezing, no crackles, no rhonchi  Abdomen: Soft, non tender, non distended, bowel sounds present, no guarding  Extremities: No edema, pulses DP and PT palpable bilaterally  Neuro: Grossly nonfocal  Data Reviewed: Basic Metabolic Panel:  Recent Labs Lab 03/24/13 1638 03/25/13 0412  NA 139 142  K 3.6* 3.8  CL 98 107  CO2 26 22  GLUCOSE 141* 105*  BUN 17 20  CREATININE 0.81 0.68  CALCIUM 9.7 8.2*  MG  --  1.6   Liver Function Tests:  Recent Labs Lab 03/24/13 1638  AST 15  ALT 10  ALKPHOS 107  BILITOT 0.3  PROT 7.4  ALBUMIN 3.7    Recent Labs Lab 03/24/13 1605  LIPASE 21   CBC:  Recent Labs Lab 03/24/13 1605 03/25/13 0412  WBC 15.8* 9.8  NEUTROABS 14.2*  --   HGB 13.3 11.1*  HCT  40.1 33.6*  MCV 85.0 85.5  PLT 195 157   CBG:  Recent Labs Lab 03/24/13 1615  GLUCAP 120*    Scheduled Meds: . antiseptic oral rinse  15 mL Mouth Rinse BID  . calcium-vitamin D  1 tablet Oral q morning - 10a  . enoxaparin (LOVENOX) injection  40 mg Subcutaneous Q24H  . latanoprost  1 drop Both Eyes QHS  . levothyroxine  100 mcg Oral QAC breakfast  .  morphine injection  2 mg Intravenous Once  . multivitamin with minerals  1 tablet Oral q morning - 10a  . sodium chloride  3 mL Intravenous Q12H  . timolol  1 drop Both Eyes BID   Continuous Infusions: . sodium chloride 0.9 % 1,000 mL with potassium chloride 40 mEq infusion 125 mL/hr at 03/25/13 0000      Faye Ramsay, MD  [facility] Pager [phone]  If 7PM-7AM, please contact night-coverage [email or web address] Password [email or web address] 03/25/2013, 9:03 AM   LOS: 1 day

## 2013-03-25 NOTE — Progress Notes (Signed)
UR completed. Patient changed to inpatient- requiring IVF @ 125cc/hr

## 2013-03-26 ENCOUNTER — Inpatient Hospital Stay (HOSPITAL_COMMUNITY): Payer: Medicare Other

## 2013-03-26 ENCOUNTER — Encounter (HOSPITAL_COMMUNITY): Payer: Self-pay | Admitting: Radiology

## 2013-03-26 LAB — BASIC METABOLIC PANEL
BUN: 12 mg/dL (ref 6–23)
CHLORIDE: 108 meq/L (ref 96–112)
CO2: 20 meq/L (ref 19–32)
Calcium: 8.2 mg/dL — ABNORMAL LOW (ref 8.4–10.5)
Creatinine, Ser: 0.67 mg/dL (ref 0.50–1.10)
GFR calc Af Amer: >90 mL/min (ref >90)
GFR calc non Af Amer: 79 mL/min — ABNORMAL LOW (ref >90)
Glucose, Bld: 85 mg/dL (ref 70–99)
Potassium: 4.2 mEq/L (ref 3.7–5.3)
Sodium: 138 mEq/L (ref 137–147)

## 2013-03-26 LAB — CBC
HCT: 33.2 % — ABNORMAL LOW (ref 36.0–46.0)
HEMOGLOBIN: 10.5 g/dL — AB (ref 12.0–15.0)
MCH: 27.5 pg (ref 26.0–34.0)
MCHC: 31.6 g/dL (ref 30.0–36.0)
MCV: 86.9 fL (ref 78.0–100.0)
Platelets: 144 10*3/uL — ABNORMAL LOW (ref 150–400)
RBC: 3.82 MIL/uL — ABNORMAL LOW (ref 3.87–5.11)
RDW: 14.6 % (ref 11.5–15.5)
WBC: 5.3 10*3/uL (ref 4.0–10.5)

## 2013-03-26 MED ORDER — IOHEXOL 300 MG/ML  SOLN
25.0000 mL | INTRAMUSCULAR | Status: AC
  Administered 2013-03-26 (×2): 25 mL via ORAL

## 2013-03-26 MED ORDER — IOHEXOL 300 MG/ML  SOLN
100.0000 mL | Freq: Once | INTRAMUSCULAR | Status: AC | PRN
  Administered 2013-03-26: 100 mL via INTRAVENOUS

## 2013-03-26 NOTE — Progress Notes (Signed)
Patient ID: Stephanie Donovan, female   DOB: 1927-04-30, 80 y.o.   MRN: [id number]  [facility] PROGRESS NOTE  Stephanie Donovan:[id number] DOB: 1927-11-21 DOA: 03/24/2013 PCP: [name], MD  Brief narrative:  80 year old female with history of hypothyroidism and glaucoma was brought in by EMS after having several episodes of nausea, vomiting and nonbloody watery diarrhea at home, associated with lightheadedness and dizziness. She reported that her son lives with her and had similar symptoms one week prior to this admission.   Course in the ED  Patient was tachycardic to 110s. Vitals otherwise stable. Blood work done showed leukocytosis with WBC of 15.8 with normal hemoglobin, hematocrit and platelets. Chemistry showed potassium of 2.6 and anion gap of 15. Renal function was normal. Patient given 2 L IV normal saline bolus and 4 mg of IV Zofran after which her nausea and vomiting improved.  Principal Problem:  Acute gastroenteritis  - pt is clinically improving but still with diarrhea  - C. Diff is negative  - stool panel ordered, O&P, culture all pending  - continue supportive care with analgesia and antiemetics as needed  - plan for Ct abd with contrast for further evaluation  Active Problems:  Hypothyroidism  - continue synthroid  Syncope  - likely from dehydration  - continue IVF for now, encourage PO intake  - PT evaluation while inpatient  Glaucoma  - continue home medical regimen  Hypokalemia  - secondary to vomiting and diarrhea  - supplemented and WNL this AM  - repeat BMP in AM  Leukocytosis  - secondary to principal problem  - now resolved   Consultants:  None Procedures/Studies:  None  Antibiotics:  None  Code Status: Full  Family Communication: Pt at bedside  Disposition Plan: Home when medically stable, possibly in AM  HPI/Subjective: No events overnight. Still with multiple episodes of non bloody diarrhea.  Objective: Filed Vitals:   03/25/13 0425 03/25/13 1512 03/25/13 2104 03/26/13 0605  BP: 109/52 116/55 130/70 138/64  Pulse: 97 82 91 98  Temp: 98.6 F (37 C) 98.5 F (36.9 C) 99.3 F (37.4 C) 98.5 F (36.9 C)  TempSrc: Oral Oral Oral Oral  Resp: 18 19 16 18   Height:      Weight:      SpO2: 95% 97% 100% 97%    Intake/Output Summary (Last 24 hours) at 03/26/13 1011 Last data filed at 03/26/13 0600  Gross per 24 hour  Intake   3210 ml  Output      0 ml  Net   3210 ml    Exam:   General:  Pt is alert, follows commands appropriately, not in acute distress  Cardiovascular: Regular rate and rhythm, S1/S2, no murmurs, no rubs, no gallops  Respiratory: Clear to auscultation bilaterally, no wheezing, no crackles, no rhonchi  Abdomen: Soft, slightly tender in epigastric area, non distended, bowel sounds present, no guarding  Extremities: No edema, pulses DP and PT palpable bilaterally  Neuro: Grossly nonfocal  Data Reviewed: Basic Metabolic Panel:  Recent Labs Lab 03/24/13 1638 03/25/13 0412 03/26/13 0335  NA 139 142 138  K 3.6* 3.8 4.2  CL 98 107 108  CO2 26 22 20   GLUCOSE 141* 105* 85  BUN 17 20 12   CREATININE 0.81 0.68 0.67  CALCIUM 9.7 8.2* 8.2*  MG  --  1.6  --    Liver Function Tests:  Recent Labs Lab 03/24/13 1638  AST 15  ALT 10  ALKPHOS 107  BILITOT 0.3  PROT 7.4  ALBUMIN 3.7    Recent Labs Lab 03/24/13 1605  LIPASE 21   CBC:  Recent Labs Lab 03/24/13 1605 03/25/13 0412 03/26/13 0335  WBC 15.8* 9.8 5.3  NEUTROABS 14.2*  --   --   HGB 13.3 11.1* 10.5*  HCT 40.1 33.6* 33.2*  MCV 85.0 85.5 86.9  PLT 195 157 144*   CBG:  Recent Labs Lab 03/24/13 1615  GLUCAP 120*    Recent Results (from the past 240 hour(s))  CLOSTRIDIUM DIFFICILE BY PCR     Status: None   Collection Time    03/25/13  4:22 AM      Result Value Ref Range Status   C difficile by pcr NEGATIVE  NEGATIVE Final   Comment: Performed at [facility]     Scheduled Meds: .  antiseptic oral rinse  15 mL Mouth Rinse BID  . calcium-vitamin D  1 tablet Oral q morning - 10a  . enoxaparin (LOVENOX) injection  40 mg Subcutaneous Q24H  . iohexol  25 mL Oral Q1 Hr x 2  . latanoprost  1 drop Both Eyes QHS  . levothyroxine  100 mcg Oral QAC breakfast  .  morphine injection  2 mg Intravenous Once  . multivitamin with minerals  1 tablet Oral q morning - 10a  . sodium chloride  3 mL Intravenous Q12H  . timolol  1 drop Both Eyes BID   Continuous Infusions: . sodium chloride 0.9 % 1,000 mL with potassium chloride 40 mEq infusion 125 mL/hr at 03/26/13 0606     Faye Ramsay, MD  [facility] Pager [phone]  If 7PM-7AM, please contact night-coverage [email or web address] Password [email or web address] 03/26/2013, 10:11 AM   LOS: 2 days

## 2013-03-26 NOTE — Evaluation (Signed)
Physical Therapy Evaluation Patient Details Name: Stephanie Donovan MRN: [id number] DOB: Jun 04, 1927 Today's Date: 03/26/2013 Time: 8676-1950 PT Time Calculation (min): 16 min  PT Assessment / Plan / Recommendation History of Present Illness  80 year old female with history of hypothyroidism and glaucoma was brought in by EMS after having several episodes of nausea, vomiting and nonbloody watery diarrhea at home, associated with lightheadedness and dizziness. She reported that her son lives with her and had similar symptoms one week prior to this admission.   Clinical Impression  Pt admitted with acute gastroenteritis. Pt currently with functional limitations due to the deficits listed below (see PT Problem List).  Pt will benefit from skilled PT to increase their independence and safety with mobility to allow discharge to the venue listed below.  Pt reports N/V/D symptoms have improved since admission and she will likely return to baseline upon d/c with no f/u needs.      PT Assessment  Patient needs continued PT services    Follow Up Recommendations  No PT follow up    Does the patient have the potential to tolerate intense rehabilitation      Barriers to Discharge        Equipment Recommendations  None recommended by PT    Recommendations for Other Services     Frequency Min 3X/week    Precautions / Restrictions Precautions Precautions: Fall   Pertinent Vitals/Pain IV site bleeding, RN called in to assist      Mobility  Bed Mobility Overal bed mobility: Modified Independent Transfers Overall transfer level: Needs assistance Transfers: Sit to/from Stand Sit to Stand: Min guard Ambulation/Gait Ambulation/Gait assistance: Min guard Ambulation Distance (Feet): 160 Feet Gait Pattern/deviations: Step-through pattern General Gait Details: pt ambulated with IV pole and occasionally holding railing, declined RW although does have one at home, reports mild lightheadedness  however did not become worse, IV site bleeding upon return to room and RN called in to assist    Exercises     PT Diagnosis: Difficulty walking  PT Problem List: Decreased strength;Decreased activity tolerance;Decreased mobility PT Treatment Interventions: DME instruction;Gait training;Functional mobility training;Therapeutic activities;Therapeutic exercise;Stair training;Patient/family education     PT Goals(Current goals can be found in the care plan section) Acute Rehab PT Goals PT Goal Formulation: With patient Time For Goal Achievement: 04/02/13 Potential to Achieve Goals: Good  Visit Information  Last PT Received On: 03/26/13 Assistance Needed: +1 History of Present Illness: 80 year old female with history of hypothyroidism and glaucoma was brought in by EMS after having several episodes of nausea, vomiting and nonbloody watery diarrhea at home, associated with lightheadedness and dizziness. She reported that her son lives with her and had similar symptoms one week prior to this admission.        Prior [place] expects to be discharged to:: Private residence Living Arrangements: Alone Type of Home: House Home Layout: Two level Alternate Level Stairs-Number of Steps: flight Alternate Level Stairs-Rails: Right Home Equipment: Walker - 2 wheels Prior Function Level of Independence: Independent Communication Communication: No difficulties    Cognition  Cognition Arousal/Alertness: Awake/alert Behavior During Therapy: WFL for tasks assessed/performed Overall Cognitive Status: Within Functional Limits for tasks assessed    Extremity/Trunk Assessment Lower Extremity Assessment Lower Extremity Assessment: Generalized weakness   Balance    End of Session PT - End of Session Activity Tolerance: Patient tolerated treatment well Patient left: in chair;with call bell/phone within reach;with nursing/sitter in room  GP     [facility]  E  03/26/2013, 9:52 AM Carmelia Bake, PT, DPT 03/26/2013 Pager: [phone]

## 2013-03-27 LAB — BASIC METABOLIC PANEL
BUN: 8 mg/dL (ref 6–23)
CO2: 19 mEq/L (ref 19–32)
CREATININE: 0.63 mg/dL (ref 0.50–1.10)
Calcium: 8.2 mg/dL — ABNORMAL LOW (ref 8.4–10.5)
Chloride: 105 mEq/L (ref 96–112)
GFR calc non Af Amer: 81 mL/min — ABNORMAL LOW (ref >90)
GLUCOSE: 64 mg/dL — AB (ref 70–99)
POTASSIUM: 4.4 meq/L (ref 3.7–5.3)
Sodium: 137 mEq/L (ref 137–147)

## 2013-03-27 LAB — CBC
HEMATOCRIT: 31.1 % — AB (ref 36.0–46.0)
HEMOGLOBIN: 10.2 g/dL — AB (ref 12.0–15.0)
MCH: 28.2 pg (ref 26.0–34.0)
MCHC: 32.8 g/dL (ref 30.0–36.0)
MCV: 85.9 fL (ref 78.0–100.0)
Platelets: 147 10*3/uL — ABNORMAL LOW (ref 150–400)
RBC: 3.62 MIL/uL — ABNORMAL LOW (ref 3.87–5.11)
RDW: 14.4 % (ref 11.5–15.5)
WBC: 4.9 10*3/uL (ref 4.0–10.5)

## 2013-03-27 NOTE — Discharge Instructions (Signed)
Viral Gastroenteritis Viral gastroenteritis is also known as stomach flu. This condition affects the stomach and intestinal tract. It can cause sudden diarrhea and vomiting. The illness typically lasts 3 to 8 days. Most people develop an immune response that eventually gets rid of the virus. While this natural response develops, the virus can make you quite ill. CAUSES  Many different viruses can cause gastroenteritis, such as rotavirus or noroviruses. You can catch one of these viruses by consuming contaminated food or water. You may also catch a virus by sharing utensils or other personal items with an infected person or by touching a contaminated surface. SYMPTOMS  The most common symptoms are diarrhea and vomiting. These problems can cause a severe loss of body fluids (dehydration) and a body salt (electrolyte) imbalance. Other symptoms may include:  Fever.  Headache.  Fatigue.  Abdominal pain. DIAGNOSIS  Your caregiver can usually diagnose viral gastroenteritis based on your symptoms and a physical exam. A stool sample may also be taken to test for the presence of viruses or other infections. TREATMENT  This illness typically goes away on its own. Treatments are aimed at rehydration. The most serious cases of viral gastroenteritis involve vomiting so severely that you are not able to keep fluids down. In these cases, fluids must be given through an intravenous line (IV). HOME CARE INSTRUCTIONS   Drink enough fluids to keep your urine clear or pale yellow. Drink small amounts of fluids frequently and increase the amounts as tolerated.  Ask your caregiver for specific rehydration instructions.  Avoid:  Foods high in sugar.  Alcohol.  Carbonated drinks.  Tobacco.  Juice.  Caffeine drinks.  Extremely hot or cold fluids.  Fatty, greasy foods.  Too much intake of anything at one time.  Dairy products until 24 to 48 hours after diarrhea stops.  You may consume probiotics.  Probiotics are active cultures of beneficial bacteria. They may lessen the amount and number of diarrheal stools in adults. Probiotics can be found in yogurt with active cultures and in supplements.  Wash your hands well to avoid spreading the virus.  Only take over-the-counter or prescription medicines for pain, discomfort, or fever as directed by your caregiver. Do not give aspirin to children. Antidiarrheal medicines are not recommended.  Ask your caregiver if you should continue to take your regular prescribed and over-the-counter medicines.  Keep all follow-up appointments as directed by your caregiver. SEEK IMMEDIATE MEDICAL CARE IF:   You are unable to keep fluids down.  You do not urinate at least once every 6 to 8 hours.  You develop shortness of breath.  You notice blood in your stool or vomit. This may look like coffee grounds.  You have abdominal pain that increases or is concentrated in one small area (localized).  You have persistent vomiting or diarrhea.  You have a fever.  The patient is a child younger than 3 months, and he or she has a fever.  The patient is a child older than 3 months, and he or she has a fever and persistent symptoms.  The patient is a child older than 3 months, and he or she has a fever and symptoms suddenly get worse.  The patient is a baby, and he or she has no tears when crying. MAKE SURE YOU:   Understand these instructions.  Will watch your condition.  Will get help right away if you are not doing well or get worse. Document Released: 01/08/2005 Document Revised: 04/02/2011 Document Reviewed: 10/25/2010   [employer] Patient Information 2014 [employer]  

## 2013-03-28 ENCOUNTER — Encounter (HOSPITAL_COMMUNITY): Payer: Self-pay | Admitting: Emergency Medicine

## 2013-03-28 ENCOUNTER — Inpatient Hospital Stay (HOSPITAL_COMMUNITY)
Admission: EM | Admit: 2013-03-28 | Discharge: 2013-03-29 | DRG: 310 | Disposition: A | Discharge status: Home / Self Care | Payer: Medicare Other | Attending: Internal Medicine | Admitting: Internal Medicine

## 2013-03-28 ENCOUNTER — Emergency Department (HOSPITAL_COMMUNITY): Payer: Medicare Other

## 2013-03-28 DIAGNOSIS — K529 Noninfective gastroenteritis and colitis, unspecified: Secondary | ICD-10-CM

## 2013-03-28 DIAGNOSIS — Z880 Allergy status to penicillin: Secondary | ICD-10-CM

## 2013-03-28 DIAGNOSIS — H409 Unspecified glaucoma: Secondary | ICD-10-CM

## 2013-03-28 DIAGNOSIS — Z96659 Presence of unspecified artificial knee joint: Secondary | ICD-10-CM

## 2013-03-28 DIAGNOSIS — Z885 Allergy status to narcotic agent status: Secondary | ICD-10-CM

## 2013-03-28 DIAGNOSIS — R197 Diarrhea, unspecified: Secondary | ICD-10-CM

## 2013-03-28 DIAGNOSIS — E039 Hypothyroidism, unspecified: Secondary | ICD-10-CM

## 2013-03-28 DIAGNOSIS — I4891 Unspecified atrial fibrillation: Principal | ICD-10-CM

## 2013-03-28 DIAGNOSIS — R55 Syncope and collapse: Secondary | ICD-10-CM

## 2013-03-28 DIAGNOSIS — K5289 Other specified noninfective gastroenteritis and colitis: Secondary | ICD-10-CM

## 2013-03-28 DIAGNOSIS — A088 Other specified intestinal infections: Secondary | ICD-10-CM | POA: Diagnosis present

## 2013-03-28 DIAGNOSIS — E876 Hypokalemia: Secondary | ICD-10-CM

## 2013-03-28 LAB — COMPREHENSIVE METABOLIC PANEL
ALBUMIN: 2.6 g/dL — AB (ref 3.5–5.2)
ALT: 13 U/L (ref 0–35)
AST: 19 U/L (ref 0–37)
Alkaline Phosphatase: 67 U/L (ref 39–117)
BUN: 8 mg/dL (ref 6–23)
CO2: 23 mEq/L (ref 19–32)
Calcium: 7.9 mg/dL — ABNORMAL LOW (ref 8.4–10.5)
Chloride: 107 mEq/L (ref 96–112)
Creatinine, Ser: 0.59 mg/dL (ref 0.50–1.10)
GFR calc Af Amer: >90 mL/min (ref >90)
GFR calc non Af Amer: 82 mL/min — ABNORMAL LOW (ref >90)
Glucose, Bld: 92 mg/dL (ref 70–99)
POTASSIUM: 3.5 meq/L — AB (ref 3.7–5.3)
Sodium: 141 mEq/L (ref 137–147)
TOTAL PROTEIN: 5.7 g/dL — AB (ref 6.0–8.3)
Total Bilirubin: 0.3 mg/dL (ref 0.3–1.2)

## 2013-03-28 LAB — I-STAT TROPONIN, ED: Troponin i, poc: 0.02 ng/mL (ref 0.00–0.08)

## 2013-03-28 LAB — CBC
HCT: 34.9 % — ABNORMAL LOW (ref 36.0–46.0)
HEMOGLOBIN: 11.7 g/dL — AB (ref 12.0–15.0)
MCH: 27.9 pg (ref 26.0–34.0)
MCHC: 33.5 g/dL (ref 30.0–36.0)
MCV: 83.3 fL (ref 78.0–100.0)
Platelets: 164 10*3/uL (ref 150–400)
RBC: 4.19 MIL/uL (ref 3.87–5.11)
RDW: 13.7 % (ref 11.5–15.5)
WBC: 5 10*3/uL (ref 4.0–10.5)

## 2013-03-28 LAB — MAGNESIUM: MAGNESIUM: 1.5 mg/dL (ref 1.5–2.5)

## 2013-03-28 MED ORDER — ADULT MULTIVITAMIN W/MINERALS CH
1.0000 | ORAL_TABLET | Freq: Every morning | ORAL | Status: DC
  Administered 2013-03-28 – 2013-03-29 (×2): 1 via ORAL
  Filled 2013-03-28 (×2): qty 1

## 2013-03-28 MED ORDER — LORATADINE 10 MG PO TABS
10.0000 mg | ORAL_TABLET | Freq: Every day | ORAL | Status: DC | PRN
  Filled 2013-03-28: qty 1

## 2013-03-28 MED ORDER — DILTIAZEM HCL 25 MG/5ML IV SOLN
15.0000 mg | Freq: Once | INTRAVENOUS | Status: AC
  Administered 2013-03-28: 15 mg via INTRAVENOUS
  Filled 2013-03-28: qty 5

## 2013-03-28 MED ORDER — ASPIRIN EC 81 MG PO TBEC
81.0000 mg | DELAYED_RELEASE_TABLET | Freq: Every day | ORAL | Status: DC
  Administered 2013-03-29: 81 mg via ORAL
  Filled 2013-03-28 (×2): qty 1

## 2013-03-28 MED ORDER — CALCIUM CARBONATE-VITAMIN D 500-200 MG-UNIT PO TABS
1.0000 | ORAL_TABLET | Freq: Every morning | ORAL | Status: DC
  Administered 2013-03-28 – 2013-03-29 (×2): 1 via ORAL
  Filled 2013-03-28 (×2): qty 1

## 2013-03-28 MED ORDER — POTASSIUM CHLORIDE 2 MEQ/ML IV SOLN
INTRAVENOUS | Status: DC
  Administered 2013-03-28: 16:00:00 via INTRAVENOUS
  Filled 2013-03-28 (×5): qty 1000

## 2013-03-28 MED ORDER — ONDANSETRON HCL 4 MG/2ML IJ SOLN: 4.0000 mg | Freq: Four times a day (QID) | INTRAMUSCULAR | Status: DC | PRN

## 2013-03-28 MED ORDER — TIMOLOL MALEATE 0.5 % OP SOLN
1.0000 [drp] | Freq: Two times a day (BID) | OPHTHALMIC | Status: DC
  Administered 2013-03-28 – 2013-03-29 (×2): 1 [drp] via OPHTHALMIC
  Filled 2013-03-28: qty 5

## 2013-03-28 MED ORDER — LATANOPROST 0.005 % OP SOLN
1.0000 [drp] | Freq: Every day | OPHTHALMIC | Status: DC
  Administered 2013-03-28: 1 [drp] via OPHTHALMIC
  Filled 2013-03-28 (×2): qty 2.5

## 2013-03-28 MED ORDER — ENOXAPARIN SODIUM 40 MG/0.4ML ~~LOC~~ SOLN
40.0000 mg | SUBCUTANEOUS | Status: DC
  Filled 2013-03-28 (×2): qty 0.4

## 2013-03-28 MED ORDER — ACETAMINOPHEN 325 MG PO TABS: 650.0000 mg | ORAL_TABLET | Freq: Four times a day (QID) | ORAL | Status: DC | PRN

## 2013-03-28 MED ORDER — ONDANSETRON HCL 4 MG PO TABS: 4.0000 mg | ORAL_TABLET | Freq: Four times a day (QID) | ORAL | Status: DC | PRN

## 2013-03-28 MED ORDER — SODIUM CHLORIDE 0.9 % IJ SOLN
3.0000 mL | Freq: Two times a day (BID) | INTRAMUSCULAR | Status: DC
  Administered 2013-03-28 (×2): 3 mL via INTRAVENOUS

## 2013-03-28 MED ORDER — SODIUM CHLORIDE 0.9 % IV BOLUS (SEPSIS)
1000.0000 mL | Freq: Once | INTRAVENOUS | Status: AC
  Administered 2013-03-28: 1000 mL via INTRAVENOUS

## 2013-03-28 MED ORDER — ACETAMINOPHEN 650 MG RE SUPP: 650.0000 mg | Freq: Four times a day (QID) | RECTAL | Status: DC | PRN

## 2013-03-28 MED ORDER — DILTIAZEM HCL 100 MG IV SOLR
5.0000 mg/h | INTRAVENOUS | Status: DC
  Administered 2013-03-28: 5 mg/h via INTRAVENOUS

## 2013-03-28 MED ORDER — LEVOTHYROXINE SODIUM 100 MCG PO TABS
100.0000 ug | ORAL_TABLET | Freq: Every day | ORAL | Status: DC
  Administered 2013-03-29: 100 ug via ORAL
  Filled 2013-03-28 (×2): qty 1

## 2013-03-28 NOTE — ED Provider Notes (Addendum)
Medical screening examination/treatment/procedure(s) were conducted as a shared visit with non-physician practitioner(s) or resident  and myself.  I personally evaluated the patient during the encounter and agree with the findings and plan unless otherwise indicated.    I have personally reviewed any xrays and/ or EKG's with the provider and I agree with interpretation.   Recent admission to [facility].  Recurrent diarrhea, non bloody, denies recent abx.  No hx of afib.  Exam dry mm, tachy/ irregular, abdo soft/ NT, mild distress initially with a fib rvr/ mild diaphoresis.  EKG afib rvr.  Cardizem bolus// drip with fluid bolus.  Pt improved significantly in ED, hr controlled after medicines/ fluids.  Admitted for further evaluation.     EKG Interpretation   Date/Time:  Saturday March 28 2013 09:25:40 EST Ventricular Rate:  179 PR Interval:  79 QRS Duration: 81 QT Interval:  287 QTC Calculation: 495 R Axis:   9 Text Interpretation:  Atrial fibrillation Abnormal R-wave progression,  early transition Repolarization abnormality, prob rate related Confirmed  by Reather Converse  MD, Dusty Raczkowski ([id number]) on 03/28/2013 9:39:26 AM        Mariea Clonts, MD 03/28/13 1601  Mariea Clonts, MD 03/28/13 1601

## 2013-03-28 NOTE — Discharge Summary (Signed)
Physician Discharge Summary  Stephanie Donovan EVO:[id number] DOB: May 29, 1927 DOA: 03/24/2013  PCP: [name], MD  Admit date: 03/24/2013 Discharge date: 03/27/2013  Recommendations for Outpatient Follow-up:  1. Pt will need to follow up with PCP in 2-3 weeks post discharge 2. Please obtain BMP to evaluate electrolytes and kidney function 3. Please also check CBC to evaluate Hg and Hct levels  Discharge Diagnoses: Acute viral gastroenteritis  Principal Problem:   Acute gastroenteritis Active Problems:   Hypothyroidism   Syncope   Glaucoma   Hypokalemia  Discharge Condition: Stable  Diet recommendation: Heart healthy diet discussed in details   80 year old female with history of hypothyroidism and glaucoma was brought in by EMS after having several episodes of nausea, vomiting and nonbloody watery diarrhea at home, associated with lightheadedness and dizziness. She reported that her son lives with her and had similar symptoms one week prior to this admission.   Course in the ED  Patient was tachycardic to 110s. Vitals otherwise stable. Blood work done showed leukocytosis with WBC of 15.8 with normal hemoglobin, hematocrit and platelets. Chemistry showed potassium of 2.6 and anion gap of 15. Renal function was normal. Patient given 2 L IV normal saline bolus and 4 mg of IV Zofran after which her nausea and vomiting improved.  Principal Problem:  Acute gastroenteritis  - pt is clinically improving and reports only 2 diarrhea since yesterday  - C. Diff is negative  - stool panel ordered, O&P, cultures negative to date  - Ct abd with contrast unremarkable for acute events  Active Problems:  Hypothyroidism  - continue synthroid upon discharge  Syncope  - likely from dehydration  - tolerating current diet well, wants to go home  Glaucoma  - continue home medical regimen  Hypokalemia  - secondary to vomiting and diarrhea  - given One dose of K-dur this AM Leukocytosis  -  secondary to principal problem  - now resolved   Consultants:  None Procedures/Studies:  None  Antibiotics:  None  Code Status: Full  Family Communication: Pt at bedside    Discharge Exam: Filed Vitals:   03/27/13 0500  BP: 124/65  Pulse: 85  Temp: 98.3 F (36.8 C)  Resp: 18   Filed Vitals:   03/26/13 0605 03/26/13 1430 03/26/13 2047 03/27/13 0500  BP: 138/64 97/61 118/57 124/65  Pulse: 98 103 89 85  Temp: 98.5 F (36.9 C) 98.8 F (37.1 C) 97.5 F (36.4 C) 98.3 F (36.8 C)  TempSrc: Oral Oral Oral Oral  Resp: 18 20 18 18   Height:      Weight:      SpO2: 97% 98% 98% 97%    General: Pt is alert, follows commands appropriately, not in acute distress Cardiovascular: Regular rate and rhythm, S1/S2 +, no murmurs, no rubs, no gallops Respiratory: Clear to auscultation bilaterally, no wheezing, no crackles, no rhonchi Abdominal: Soft, non tender, non distended, bowel sounds +, no guarding Extremities: no edema, no cyanosis, pulses palpable bilaterally DP and PT Neuro: Grossly nonfocal  Discharge Instructions      Discharge Orders   Future Orders Complete By Expires   Diet - low sodium heart healthy  As directed    Increase activity slowly  As directed        Medication List         calcium-vitamin D 500-200 MG-UNIT per tablet  Commonly known as:  OSCAL WITH D  Take 1 tablet by mouth every morning.     latanoprost 0.005 %  ophthalmic solution  Commonly known as:  XALATAN  Place 1 drop into both eyes at bedtime.     levothyroxine 100 MCG tablet  Commonly known as:  SYNTHROID, LEVOTHROID  Take 100 mcg by mouth daily before breakfast.     multivitamin with minerals Tabs tablet  Take 1 tablet by mouth every morning.     timolol 0.5 % ophthalmic solution  Commonly known as:  TIMOPTIC  Place 1 drop into both eyes 2 (two) times daily.       Follow-up Information   Schedule an appointment as soon as possible for a visit with ANDY,CAMILLE L, MD.    Specialty:  Family Medicine   Contact information:   [place] [address] [phone]       Follow up with Faye Ramsay, MD. (As needed, If symptoms worsen call my cell phone [phone])    Specialty:  Internal Medicine   Contact information:   [address] [place] [address] [phone]        The results of significant diagnostics from this hospitalization (including imaging, microbiology, ancillary and laboratory) are listed below for reference.     Microbiology: Recent Results (from the past 240 hour(s))  CLOSTRIDIUM DIFFICILE BY PCR     Status: None   Collection Time    03/25/13  4:22 AM      Result Value Ref Range Status   C difficile by pcr NEGATIVE  NEGATIVE Final   Comment: Performed at [facility]: Basic Metabolic Panel:  Recent Labs Lab 03/24/13 1638 03/25/13 0412 03/26/13 0335 03/27/13 0332 03/28/13 1015  NA 139 142 138 137 141  K 3.6* 3.8 4.2 4.4 3.5*  CL 98 107 108 105 107  CO2 26 22 20 19 23   GLUCOSE 141* 105* 85 64* 92  BUN 17 20 12 8 8   CREATININE 0.81 0.68 0.67 0.63 0.59  CALCIUM 9.7 8.2* 8.2* 8.2* 7.9*  MG  --  1.6  --   --   --    Liver Function Tests:  Recent Labs Lab 03/24/13 1638 03/28/13 1015  AST 15 19  ALT 10 13  ALKPHOS 107 67  BILITOT 0.3 0.3  PROT 7.4 5.7*  ALBUMIN 3.7 2.6*    Recent Labs Lab 03/24/13 1605  LIPASE 21   CBC:  Recent Labs Lab 03/24/13 1605 03/25/13 0412 03/26/13 0335 03/27/13 0332 03/28/13 1015  WBC 15.8* 9.8 5.3 4.9 5.0  NEUTROABS 14.2*  --   --   --   --   HGB 13.3 11.1* 10.5* 10.2* 11.7*  HCT 40.1 33.6* 33.2* 31.1* 34.9*  MCV 85.0 85.5 86.9 85.9 83.3  PLT 195 157 144* 147* 164   CBG:  Recent Labs Lab 03/24/13 1615  GLUCAP 120*     SIGNED: Time coordinating discharge: Over 30 minutes  Faye Ramsay, MD  [facility] 03/28/2013, 2:09 PM Pager [phone]  If 7PM-7AM, please contact  night-coverage [email or web address] Password [email or web address]

## 2013-03-28 NOTE — ED Notes (Signed)
Dr Zavitz at bedside  

## 2013-03-28 NOTE — ED Notes (Signed)
Attempted report 

## 2013-03-28 NOTE — ED Notes (Addendum)
Patient presents to ED from home with complaints of diarrhea, palpitations, weakness. When EMS arrived patient was found to have afib in the 140's, EMS placed 20 left hand gave 200ML NS, CBG 121 BP 106/70, patient pale and clammy.  Patient was discharged from [facility] yesterday, patient was admitted there for dehydration.

## 2013-03-28 NOTE — Discharge Summary (Deleted)
Physician Discharge Summary  Stephanie Donovan XBD:[id number] DOB: [birth year] DOA: 03/28/2013  PCP: [name], MD  Admit date: 03/28/2013 Discharge date: 03/28/2013  Recommendations for Outpatient Follow-up:  1. Pt will need to follow up with PCP in 2-3 weeks post discharge 2. Please obtain BMP to evaluate electrolytes and kidney function 3. Please also check CBC to evaluate Hg and Hct levels  Discharge Diagnoses: Acute viral gastroenteritis  Active Problems:   Hypothyroidism   Acute gastroenteritis   Atrial fibrillation with RVR   Diarrhea  Discharge Condition: Stable  Diet recommendation: Heart healthy diet discussed in details   80 year old female with history of hypothyroidism and glaucoma was brought in by EMS after having several episodes of nausea, vomiting and nonbloody watery diarrhea at home, associated with lightheadedness and dizziness. She reported that her son lives with her and had similar symptoms one week prior to this admission.   Course in the ED  Patient was tachycardic to 110s. Vitals otherwise stable. Blood work done showed leukocytosis with WBC of 15.8 with normal hemoglobin, hematocrit and platelets. Chemistry showed potassium of 2.6 and anion gap of 15. Renal function was normal. Patient given 2 L IV normal saline bolus and 4 mg of IV Zofran after which her nausea and vomiting improved.  Principal Problem:  Acute gastroenteritis  - pt is clinically improving and reports only 2 diarrhea since yesterday  - C. Diff is negative  - stool panel ordered, O&P, cultures negative to date  - Ct abd with contrast unremarkable for acute events  Active Problems:  Hypothyroidism  - continue synthroid upon discharge  Syncope  - likely from dehydration  - tolerating current diet well, wants to go home  Glaucoma  - continue home medical regimen  Hypokalemia  - secondary to vomiting and diarrhea  - given One dose of K-dur this AM Leukocytosis  - secondary to  principal problem  - now resolved   Consultants:  None Procedures/Studies:  None  Antibiotics:  None  Code Status: Full  Family Communication: Pt at bedside    Discharge Exam: Filed Vitals:   03/28/13 1357  BP: 90/59  Pulse: 88  Temp: 98.3 F (36.8 C)  Resp: 16   Filed Vitals:   03/28/13 1315 03/28/13 1327 03/28/13 1330 03/28/13 1357  BP: 118/63 118/63 122/51 90/59  Pulse: 83 85 83 88  Temp:    98.3 F (36.8 C)  TempSrc:    Oral  Resp: 14 17 15 16   Height:    5\' 3"  (1.6 m)  Weight:      SpO2: 96% 99% 100% 96%    General: Pt is alert, follows commands appropriately, not in acute distress Cardiovascular: Regular rate and rhythm, S1/S2 +, no murmurs, no rubs, no gallops Respiratory: Clear to auscultation bilaterally, no wheezing, no crackles, no rhonchi Abdominal: Soft, non tender, non distended, bowel sounds +, no guarding Extremities: no edema, no cyanosis, pulses palpable bilaterally DP and PT Neuro: Grossly nonfocal  Discharge Instructions     Medication List    ASK your doctor about these medications       calcium-vitamin D 500-200 MG-UNIT per tablet  Commonly known as:  OSCAL WITH D  Take 1 tablet by mouth every morning.     latanoprost 0.005 % ophthalmic solution  Commonly known as:  XALATAN  Place 1 drop into both eyes at bedtime.     levothyroxine 100 MCG tablet  Commonly known as:  SYNTHROID, LEVOTHROID  Take 100 mcg by mouth  daily before breakfast.     loratadine 10 MG tablet  Commonly known as:  CLARITIN  Take 10 mg by mouth daily as needed for allergies.     multivitamin with minerals Tabs tablet  Take 1 tablet by mouth every morning.     timolol 0.5 % ophthalmic solution  Commonly known as:  TIMOPTIC  Place 1 drop into both eyes 2 (two) times daily.          The results of significant diagnostics from this hospitalization (including imaging, microbiology, ancillary and laboratory) are listed below for reference.      Microbiology: Recent Results (from the past 240 hour(s))  CLOSTRIDIUM DIFFICILE BY PCR     Status: None   Collection Time    03/25/13  4:22 AM      Result Value Ref Range Status   C difficile by pcr NEGATIVE  NEGATIVE Final   Comment: Performed at [place]: Basic Metabolic Panel:  Recent Labs Lab 03/24/13 1638 03/25/13 0412 03/26/13 0335 03/27/13 0332 03/28/13 1015  NA 139 142 138 137 141  K 3.6* 3.8 4.2 4.4 3.5*  CL 98 107 108 105 107  CO2 26 22 20 19 23   GLUCOSE 141* 105* 85 64* 92  BUN 17 20 12 8 8   CREATININE 0.81 0.68 0.67 0.63 0.59  CALCIUM 9.7 8.2* 8.2* 8.2* 7.9*  MG  --  1.6  --   --   --    Liver Function Tests:  Recent Labs Lab 03/24/13 1638 03/28/13 1015  AST 15 19  ALT 10 13  ALKPHOS 107 67  BILITOT 0.3 0.3  PROT 7.4 5.7*  ALBUMIN 3.7 2.6*    Recent Labs Lab 03/24/13 1605  LIPASE 21   CBC:  Recent Labs Lab 03/24/13 1605 03/25/13 0412 03/26/13 0335 03/27/13 0332 03/28/13 1015  WBC 15.8* 9.8 5.3 4.9 5.0  NEUTROABS 14.2*  --   --   --   --   HGB 13.3 11.1* 10.5* 10.2* 11.7*  HCT 40.1 33.6* 33.2* 31.1* 34.9*  MCV 85.0 85.5 86.9 85.9 83.3  PLT 195 157 144* 147* 164   CBG:  Recent Labs Lab 03/24/13 1615  GLUCAP 120*     SIGNED: Time coordinating discharge: Over 30 minutes  Faye Ramsay, MD  [facility] 03/28/2013, 2:04 PM Pager [phone]  If 7PM-7AM, please contact night-coverage [email or web address] Password [email or web address]

## 2013-03-28 NOTE — ED Notes (Signed)
Matt PA (student) at bedside when patient arrived to ED. Aware of heart rate and rhythm.

## 2013-03-28 NOTE — Consult Note (Signed)
CARDIOLOGY CONSULT NOTE  Patient ID: Stephanie Donovan MRN: [id number] DOB/AGE: [birth year] 80 y.o.  Admit date: 03/28/2013 Referring Physician  Orson Eva, MD Primary Physician:  [name], MD Reason for Consultation  A. FIbrillation  HPI: Patient is a 80 year old female with no significant prior cardiovascular history, recently discharged after a four-day stay at [facility] hospital admitted with acute gastroenteritis, hypokalemia and dehydration and syncope.  She was discharged yesterday, felt well, last night again started to have severe diarrhea, started to feel that she was going to pass out and markedly fatigued and weak.  This morning she called the EMS, around 8:30 AM.  Around 8:00 she also noticed rapid heartbeats.  When she presented to the emergency room she was found to be in atrial fibrillation with rapid ventricular response, immediately responded to IV Cardizem converting to sinus bradycardia and sinus rhythm.  She was also fluid resuscitated as she was profoundly dehydrated on presentation with hypotension.  I was asked to see the patient.  Patient states that she has never had palpitations before, she denies any prior history of hypertension, hyperlipidemia or diabetes mellitus.  She is fairly active, and states that she has not had any health issues except glaucoma and takes thyroid supplement.  She has never had any chest pain, shortness of breath, PND or orthopnea.  No history to suggest TIA or claudication.  Past Medical History  Diagnosis Date  . Blood transfusion without reported diagnosis   . Thyroid disease   . GI bleed   . Glaucoma      Past Surgical History  Procedure Laterality Date  . Joint replacement  11/20/2011    LTKR  . Colonoscopy       History reviewed. No pertinent family history.   Social History: History   Social History  . Marital Status: Widowed    Spouse Name: N/A    Number of Children: N/A  . Years of Education: N/A   Occupational  History  . Not on file.   Social History Main Topics  . Smoking status: Never Smoker   . Smokeless tobacco: Never Used  . Alcohol Use: No  . Drug Use: No  . Sexual Activity: Not on file   Other Topics Concern  . Not on file   Social History Narrative  . No narrative on file      (Not in a hospital admission)  Scheduled Meds:  Continuous Infusions: . diltiazem (CARDIZEM) infusion 5 mg/hr (03/28/13 1029)   PRN Meds:.  ROS: General: no fevers/chills/night sweats Eyes: no blurry vision, diplopia, or amaurosis ENT: no sore throat or hearing loss Resp: no cough, wheezing, or hemoptysis GU: no dysuria, frequency, or hematuria Skin: no rash Neuro: no headache, numbness, tingling, or weakness of extremities Musculoskeletal: no joint pain or swelling Heme: no bleeding, DVT, or easy bruising Endo: no polydipsia or polyuria    Physical Exam: Blood pressure 122/51, pulse 83, temperature 97.3 F (36.3 C), temperature source Oral, resp. rate 15, height 5\' 2"  (1.575 m), weight 70.308 kg (155 lb), SpO2 100.00%.   General appearance: alert, cooperative, appears stated age and no distress Lungs: clear to auscultation bilaterally Heart: regular rate and rhythm, S1, S2 normal, no murmur, click, rub or gallop Abdomen: soft, non-tender; bowel sounds normal; no masses,  no organomegaly Extremities: extremities normal, atraumatic, no cyanosis or edema Pulses: 2+ and symmetric  Labs:   Lab Results  Component Value Date   WBC 5.0 03/28/2013   HGB 11.7* 03/28/2013  HCT 34.9* 03/28/2013   MCV 83.3 03/28/2013   PLT 164 03/28/2013    Recent Labs Lab 03/28/13 1015  NA 141  K 3.5*  CL 107  CO2 23  BUN 8  CREATININE 0.59  CALCIUM 7.9*  PROT 5.7*  BILITOT 0.3  ALKPHOS 67  ALT 13  AST 19  GLUCOSE 92   No results found for this basename: CKTOTAL, CKMB, CKMBINDEX, TROPONINI    Lipid Panel  No results found for this basename: chol, trig, hdl, cholhdl, vldl, ldlcalc    EKG: atrial  fibrillation with rapid ventricular response, inferior and lateral secondary ST segment depression with nonspecific T abnormality, cannot rule out inferolateral ischemia versus rate-related changes.    Radiology: Ct Abdomen Pelvis W Contrast  03/26/2013   CLINICAL DATA:  Nausea, vomiting and abdominal pain.  EXAM: CT ABDOMEN AND PELVIS WITH CONTRAST  TECHNIQUE: Multidetector CT imaging of the abdomen and pelvis was performed using the standard protocol following bolus administration of intravenous contrast.  CONTRAST:  150mL OMNIPAQUE IOHEXOL 300 MG/ML  SOLN  COMPARISON:  None.  FINDINGS: The lung bases are clear of acute process. Mild scarring changes are noted. The heart is within normal limits in size for age. Significant pectus deformity noted. The distal esophagus is grossly normal.  Low-attenuation liver lesions measure as simple cysts. No worrisome hepatic lesions or intrahepatic biliary dilatation. The portal vein is normal. The gallbladder demonstrates multiple gallstones. No common bile duct dilatation. The pancreas is unremarkable. The spleen is normal in size. No focal lesions. The adrenal glands and kidneys are unremarkable. Small renal cysts are noted.  The stomach, duodenum, small bowel and colon are unremarkable. No inflammatory changes, mass lesions or obstructive findings. There is significant upper sigmoid colonic diverticulosis but no findings for acute diverticulitis. The appendix is normal. No mesenteric or retroperitoneal mass or adenopathy. There are calcified lesions in the small bowel mesentery. The largest measures 3 cm. These could be calcified lymph nodes or possibly treated carcinoid tumor. No soft tissue component is identified. Moderate atherosclerotic calcifications involving the aorta. No focal aneurysm or dissection.  The uterus and ovaries are unremarkable. No pelvic mass, adenopathy or free pelvic fluid collections no inguinal mass or adenopathy.  The bony structures are  unremarkable. There are compression fractures of L1 and L3 and moderate osteoporosis. These fractures are present on the prior MRI from 10/22/2012.  IMPRESSION: 1. No acute abdominal/pelvic findings, mass lesions or adenopathy. 2. Benign appearing hepatic cysts. 3. Cholelithiasis. 4. Calcified mesenteric lesions most likely benign calcified lymph nodes. No soft tissue component and no adenopathy. 5. Remote compression fractures of L1 and L3.   Electronically Signed   By: Kalman Jewels M.D.   On: 03/26/2013 16:35   Dg Chest Portable 1 View  03/28/2013   CLINICAL DATA:  Diarrhea and palpitations.  Weakness  EXAM: PORTABLE CHEST - 1 VIEW  COMPARISON:  10/08/2011  FINDINGS: The heart size and mediastinal contours are within normal limits. Calcification within the aortic arch is identified. Both lungs are clear. The visualized skeletal structures are unremarkable.  IMPRESSION: 1. No acute findings. 2. Atherosclerosis.   Electronically Signed   By: Kerby Moors M.D.   On: 03/28/2013 10:16    ASSESSMENT AND PLAN:  1.  Atrial fibrillation with rapid ventricular response, probably related to severe dehydration and acute right abnormality. Potassium is in the lower limit of normal. First episode, patient clearly symptomatic with onset of palpitation at 8 AM, easily terminated with IV Cardizem.  2.  Frequent diarrhea, had a four-day hospital stay, discharged yesterday and now readmitted with diarrhea again, abnormal CT scan, may need to rule out carcinoid syndrome. 3.  Abdominal aortic atherosclerosis.  Recommendation: Patient's atrial fibrillation has terminated very easily, no indication for long-term anticoagulation at this point.  Fluid resuscitation, correction of electrolytes including magnesium is indicated.  Only if she has recurrence of atrial fibrillation, she will need long-term anticoagulation.  I will repeat EKG to see if the ischemic changes are still persistent, if they are she will need ischemic  workup.  Otherwise if she remains stable she can potentially be discharged in the next 24-48 hours.  I have also placed orders for evaluation of 24-hour urinary 5 HIAA, blood serotonin level.  Laverda Page, MD 03/28/2013, 1:50 PM [facility] [place] Pager: [phone] Office: [phone] If no answer Cell [phone]

## 2013-03-28 NOTE — H&P (Signed)
[facility] History and Physical  SHIWANI MAURIN [id number] DOB: [birth year] DOA: 03/28/2013   PCP: [name], MD   Chief Complaint: diarrhea, dizziness  HPI:  80 year old female with a history of GI bleed, glaucoma, and hypothyroidism presented to the ED with one episode of diarrhea without any hematochezia or melena. The patient was discharged from the hospital after a stay from 03/25/13 to 03/27/2013 for gastroenteritis, syncope and dehydration. The patient began having diarrhea again after returning home. She states that she had 15 episodes without any hematochezia or melena. She had generalized weakness and some dizziness. As a result, she came to the ED for further evaluation. She was found to have atrial fibrillation with rapid ventricular response with HR 180. The patient was given Cardizem bolus and then start her on Cardizem drip. She spontaneously converted to sinus rhythm. She remains on Cardizem drip at 5 mg per hour with a heart rate of 90. She denies any chest discomfort, shortness of breath, nausea, vomiting. She did not have any syncope yesterday. She denies any abdominal pain, dysuria, hematuria, hematochezia, melena. The patient denies any recent antibiotics patient has not had a recent travels or eating any raw or undercooked foods. She states that her son had a recent GI type illness with diarrhea. During her last admission, the patient had a syncopal episode which was thought to be related to her dehydration from diarrhea. In addition, the patient relates a history of syncope approximately one year ago. Pt denies any previous hx of afib or cardiac issues. In the ED, CMP was unremarkable. CBC was unremarkable. She remained in sinus rhythm. Blood pressure remains soft with systolic [id number]. As noted the patient's blood pressure at the time of admission was 140/119 and decreased with diltiazem drip. Assessment/Plan: Paroxysmal atrial fibrillation with  RVR -Spontaneously converted on Cardizem drip -Case discussed with cardiology, Dr. Einar Gip (officially consulted) -CHADS VASc= 3 -start ASA 81 -echo -d/c cardizem drip -admit to tele -TSH Diarrhea/Gastroenteritis -03/25/2013 Clostridium difficile PCR was negative -stool pathogen panel -IVF -supportive care Hypothyroidism -Continue Synthroid -Check TSH Glaucoma -Continue home eyedrops        Past Medical History  Diagnosis Date  . Blood transfusion without reported diagnosis   . Thyroid disease   . GI bleed   . Glaucoma    Past Surgical History  Procedure Laterality Date  . Joint replacement  11/20/2011    LTKR  . Colonoscopy     Social History:  reports that she has never smoked. She has never used smokeless tobacco. She reports that she does not drink alcohol or use illicit drugs.   History reviewed. No pertinent family history.   Allergies  Allergen Reactions  . Penicillins     Lost hearing temporarily  . Codeine Palpitations      Prior to Admission medications   Medication Sig Start Date End Date Taking? Authorizing Provider  calcium-vitamin D (OSCAL WITH D) 500-200 MG-UNIT per tablet Take 1 tablet by mouth every morning.   Yes Historical Provider, MD  latanoprost (XALATAN) 0.005 % ophthalmic solution Place 1 drop into both eyes at bedtime.   Yes Historical Provider, MD  levothyroxine (SYNTHROID, LEVOTHROID) 100 MCG tablet Take 100 mcg by mouth daily before breakfast.   Yes Historical Provider, MD  loratadine (CLARITIN) 10 MG tablet Take 10 mg by mouth daily as needed for allergies.   Yes Historical Provider, MD  Multiple Vitamin (MULTIVITAMIN WITH MINERALS) TABS Take 1 tablet by mouth every morning.   Yes Historical  Provider, MD  timolol (TIMOPTIC) 0.5 % ophthalmic solution Place 1 drop into both eyes 2 (two) times daily.   Yes Historical Provider, MD    Review of Systems:  Constitutional:  No weight loss, night sweats, Fevers, chills, fatigue.   Head&Eyes: No headache.  No vision loss.  No eye pain or scotoma ENT:  No Difficulty swallowing,Tooth/dental problems,Sore throat,  No ear ache, post nasal drip,  Cardio-vascular:  No chest pain, Orthopnea, PND, swelling in lower extremities,   palpitations  GI:  No  abdominal pain, nausea, vomiting,loss of appetite, hematochezia, melena, heartburn, indigestion, Resp:  No shortness of breath with exertion or at rest. No cough. No coughing up of blood .No wheezing.No chest wall deformity  Skin:  no rash or lesions.  GU:  no dysuria, change in color of urine, no urgency or frequency. No flank pain.  Musculoskeletal:  No joint pain or swelling. No decreased range of motion. No back pain.  Psych:  No change in mood or affect. No depression or anxiety. Neurologic: No headache, no dysesthesia, no focal weakness, no vision loss. No syncope  Physical Exam: Filed Vitals:   03/28/13 0945 03/28/13 1000 03/28/13 1001 03/28/13 1015  BP: 104/54 114/50 114/50 104/61  Pulse: 158 109 150 68  Temp:      TempSrc:      Resp: 18 12 17 14   Height:      Weight:      SpO2: 99% 99% 99% 100%   General:  A&O x 3, NAD, nontoxic, pleasant/cooperative Head/Eye: No conjunctival hemorrhage, no icterus, Plessis/AT, No nystagmus ENT:  No icterus,  No thrush, good dentition, no pharyngeal exudate Neck:  No masses, no lymphadenpathy, no bruits CV:  RRR, no rub, no gallop, no S3 Lung:  CTAB, good air movement, no wheeze, no rhonchi Abdomen: soft/NT, +BS, nondistended, no peritoneal signs Ext: No cyanosis, No rashes, No petechiae, No lymphangitis, 2+LE edema   Labs on Admission:  Basic Metabolic Panel:  Recent Labs Lab 03/24/13 1638 03/25/13 0412 03/26/13 0335 03/27/13 0332 03/28/13 1015  NA 139 142 138 137 141  K 3.6* 3.8 4.2 4.4 3.5*  CL 98 107 108 105 107  CO2 26 22 20 19 23   GLUCOSE 141* 105* 85 64* 92  BUN 17 20 12 8 8   CREATININE 0.81 0.68 0.67 0.63 0.59  CALCIUM 9.7 8.2* 8.2* 8.2* 7.9*   MG  --  1.6  --   --   --    Liver Function Tests:  Recent Labs Lab 03/24/13 1638 03/28/13 1015  AST 15 19  ALT 10 13  ALKPHOS 107 67  BILITOT 0.3 0.3  PROT 7.4 5.7*  ALBUMIN 3.7 2.6*    Recent Labs Lab 03/24/13 1605  LIPASE 21   No results found for this basename: AMMONIA,  in the last 168 hours CBC:  Recent Labs Lab 03/24/13 1605 03/25/13 0412 03/26/13 0335 03/27/13 0332 03/28/13 1015  WBC 15.8* 9.8 5.3 4.9 5.0  NEUTROABS 14.2*  --   --   --   --   HGB 13.3 11.1* 10.5* 10.2* 11.7*  HCT 40.1 33.6* 33.2* 31.1* 34.9*  MCV 85.0 85.5 86.9 85.9 83.3  PLT 195 157 144* 147* 164   Cardiac Enzymes: No results found for this basename: CKTOTAL, CKMB, CKMBINDEX, TROPONINI,  in the last 168 hours BNP: No components found with this basename: POCBNP,  CBG:  Recent Labs Lab 03/24/13 1615  GLUCAP 120*    Radiological Exams on Admission: Ct Abdomen  Pelvis W Contrast  03/26/2013   CLINICAL DATA:  Nausea, vomiting and abdominal pain.  EXAM: CT ABDOMEN AND PELVIS WITH CONTRAST  TECHNIQUE: Multidetector CT imaging of the abdomen and pelvis was performed using the standard protocol following bolus administration of intravenous contrast.  CONTRAST:  11mL OMNIPAQUE IOHEXOL 300 MG/ML  SOLN  COMPARISON:  None.  FINDINGS: The lung bases are clear of acute process. Mild scarring changes are noted. The heart is within normal limits in size for age. Significant pectus deformity noted. The distal esophagus is grossly normal.  Low-attenuation liver lesions measure as simple cysts. No worrisome hepatic lesions or intrahepatic biliary dilatation. The portal vein is normal. The gallbladder demonstrates multiple gallstones. No common bile duct dilatation. The pancreas is unremarkable. The spleen is normal in size. No focal lesions. The adrenal glands and kidneys are unremarkable. Small renal cysts are noted.  The stomach, duodenum, small bowel and colon are unremarkable. No inflammatory changes,  mass lesions or obstructive findings. There is significant upper sigmoid colonic diverticulosis but no findings for acute diverticulitis. The appendix is normal. No mesenteric or retroperitoneal mass or adenopathy. There are calcified lesions in the small bowel mesentery. The largest measures 3 cm. These could be calcified lymph nodes or possibly treated carcinoid tumor. No soft tissue component is identified. Moderate atherosclerotic calcifications involving the aorta. No focal aneurysm or dissection.  The uterus and ovaries are unremarkable. No pelvic mass, adenopathy or free pelvic fluid collections no inguinal mass or adenopathy.  The bony structures are unremarkable. There are compression fractures of L1 and L3 and moderate osteoporosis. These fractures are present on the prior MRI from 10/22/2012.  IMPRESSION: 1. No acute abdominal/pelvic findings, mass lesions or adenopathy. 2. Benign appearing hepatic cysts. 3. Cholelithiasis. 4. Calcified mesenteric lesions most likely benign calcified lymph nodes. No soft tissue component and no adenopathy. 5. Remote compression fractures of L1 and L3.   Electronically Signed   By: Kalman Jewels M.D.   On: 03/26/2013 16:35   Dg Chest Portable 1 View  03/28/2013   CLINICAL DATA:  Diarrhea and palpitations.  Weakness  EXAM: PORTABLE CHEST - 1 VIEW  COMPARISON:  10/08/2011  FINDINGS: The heart size and mediastinal contours are within normal limits. Calcification within the aortic arch is identified. Both lungs are clear. The visualized skeletal structures are unremarkable.  IMPRESSION: 1. No acute findings. 2. Atherosclerosis.   Electronically Signed   By: Kerby Moors M.D.   On: 03/28/2013 10:16    EKG: Independently reviewed. Atrial fibrillation with RVR--179    Time spent:70 minutes Code Status:   FULL Family Communication:   NO Family at bedside   Jackey Housey, DO  [facility] Pager [phone]  If 7PM-7AM, please contact  night-coverage [email or web address] Password [email or web address] 03/28/2013, 12:01 PM

## 2013-03-28 NOTE — ED Provider Notes (Signed)
CSN: [id number]     Arrival date & time 03/28/13  5284 History   First MD Initiated Contact with Patient 03/28/13 [phone]     Chief Complaint  Patient presents with  . Atrial Fibrillation     (Consider location/radiation/quality/duration/timing/severity/associated sxs/prior Treatment) HPI Comments: Patient is an 80 year old female with a past medical history of thyroid disease, GI bleed and glaucoma who presents to the emergency department via EMS complaining of returning diarrhea. Patient was admitted to the hospital and discharged home yesterday for viral gastroenteritis, had a negative CT abdomen/pelvis. This morning when she woke up she had about 15 episodes of nonbloody diarrhea. She tested negative for C. difficile in the hospital. Denies abdominal pain, chest pain or sob. Upon EMS arrival, patient was in atrial fibrillation with a heart rate in the 140s. States she was slightly lightheaded initially, however this symptom has since subsided. No history of atrial fibrillation. EMS gave 200 cc NSS. CBG 121, BP 106/70.  Patient is a 80 y.o. female presenting with atrial fibrillation. The history is provided by the patient, the EMS personnel and medical records.  Atrial Fibrillation Associated symptoms include diaphoresis and weakness.    Past Medical History  Diagnosis Date  . Blood transfusion without reported diagnosis   . Thyroid disease   . GI bleed   . Glaucoma    Past Surgical History  Procedure Laterality Date  . Joint replacement  11/20/2011    LTKR  . Colonoscopy     History reviewed. No pertinent family history. History  Substance Use Topics  . Smoking status: Never Smoker   . Smokeless tobacco: Never Used  . Alcohol Use: No   OB History   Grav Para Term Preterm Abortions TAB SAB Ect Mult Living                 Review of Systems  Constitutional: Positive for diaphoresis.  Gastrointestinal: Positive for diarrhea.  Neurological: Positive for weakness and  light-headedness.  All other systems reviewed and are negative.      Allergies  Penicillins and Codeine  Home Medications   Current Outpatient Rx  Name  Route  Sig  Dispense  Refill  . calcium-vitamin D (OSCAL WITH D) 500-200 MG-UNIT per tablet   Oral   Take 1 tablet by mouth every morning.         . latanoprost (XALATAN) 0.005 % ophthalmic solution   Both Eyes   Place 1 drop into both eyes at bedtime.         Marland Kitchen levothyroxine (SYNTHROID, LEVOTHROID) 100 MCG tablet   Oral   Take 100 mcg by mouth daily before breakfast.         . loratadine (CLARITIN) 10 MG tablet   Oral   Take 10 mg by mouth daily as needed for allergies.         . Multiple Vitamin (MULTIVITAMIN WITH MINERALS) TABS   Oral   Take 1 tablet by mouth every morning.         . timolol (TIMOPTIC) 0.5 % ophthalmic solution   Both Eyes   Place 1 drop into both eyes 2 (two) times daily.          BP 104/61  Pulse 68  Temp(Src) 97.3 F (36.3 C) (Oral)  Resp 14  Ht 5\' 2"  (1.575 m)  Wt 155 lb (70.308 kg)  BMI 28.34 kg/m2  SpO2 100% Physical Exam  Nursing note and vitals reviewed. Constitutional: She is oriented to person, place, and  time. She appears well-developed and well-nourished. No distress.  HENT:  Head: Normocephalic and atraumatic.  Mouth/Throat: Oropharynx is clear and moist.  Eyes: Conjunctivae and EOM are normal. Pupils are equal, round, and reactive to light.  Neck: Normal range of motion. Neck supple. No JVD present.  Cardiovascular: Normal heart sounds and intact distal pulses.  An irregularly irregular rhythm present. Tachycardia present.   No extremity edema. Equal distal pulses.  Pulmonary/Chest: Effort normal and breath sounds normal.  Abdominal: Soft. Bowel sounds are normal. She exhibits no distension. There is no tenderness.  Musculoskeletal: Normal range of motion. She exhibits no edema.  Neurological: She is alert and oriented to person, place, and time. She has normal  strength. No sensory deficit.  Moves limbs without ataxia. Speech fluent, goal oriented. Equal grip strength.  Skin: Skin is warm. She is diaphoretic. There is pallor.  Psychiatric: She has a normal mood and affect. Her behavior is normal.    ED Course  Procedures (including critical care time)  CRITICAL CARE Performed by: Michele Mcalpine   Total critical care time: 30  Critical care time was exclusive of separately billable procedures and treating other patients.  Critical care was necessary to treat or prevent imminent or life-threatening deterioration.  Critical care was time spent personally by me on the following activities: development of treatment plan with patient and/or surrogate as well as nursing, discussions with consultants, evaluation of patient's response to treatment, examination of patient, obtaining history from patient or surrogate, ordering and performing treatments and interventions, ordering and review of laboratory studies, ordering and review of radiographic studies, pulse oximetry and re-evaluation of patient's condition.  Labs Review Labs Reviewed  CBC - Abnormal; Notable for the following:    Hemoglobin 11.7 (*)    HCT 34.9 (*)    All other components within normal limits  COMPREHENSIVE METABOLIC PANEL - Abnormal; Notable for the following:    Potassium 3.5 (*)    Calcium 7.9 (*)    Total Protein 5.7 (*)    Albumin 2.6 (*)    GFR calc non Af Amer 82 (*)    All other components within normal limits  I-STAT TROPOININ, ED   Imaging Review    EKG Interpretation   Date/Time:  Saturday March 28 2013 09:25:40 EST Ventricular Rate:  179 PR Interval:  79 QRS Duration: 81 QT Interval:  287 QTC Calculation: 495 R Axis:   9 Text Interpretation:  Atrial fibrillation Abnormal R-wave progression,  early transition Repolarization abnormality, prob rate related Confirmed  by ZAVITZ  MD, JOSHUA ([id number]) on 03/28/2013 9:39:26 AM      MDM   Final diagnoses:   Atrial fibrillation, new onset  Diarrhea   Pt presenting with diarrhea and new-onset afib. Recent admission for gastroenteritis. Pt diaphoretic but in NAD. BP stable. Labs pending. CXR without acute findings. Pt given cardizem bolus, HR decreased to 91, still in afib. Will place on cardizem drip. I reviewed patient's rhythm strip confirming atrial fibrillation. Plan to admit patient. Case discussed with attending Dr. Reather Converse who also evaluated patient and agrees with plan of care. 11:32 AM HR remains stable in the 80s. Labs without any acute concern. Patient reports she is feeling better at this time. Vitals remain stable. Patient will be admitted, admission accepted by Dr. Carles Collet, [facility]  Illene Labrador, PA-C 03/28/13 1134

## 2013-03-29 LAB — BASIC METABOLIC PANEL
BUN: 5 mg/dL — ABNORMAL LOW (ref 6–23)
CALCIUM: 8.4 mg/dL (ref 8.4–10.5)
CO2: 27 mEq/L (ref 19–32)
Chloride: 103 mEq/L (ref 96–112)
Creatinine, Ser: 0.63 mg/dL (ref 0.50–1.10)
GFR, EST NON AFRICAN AMERICAN: 81 mL/min — AB (ref >90)
Glucose, Bld: 97 mg/dL (ref 70–99)
Potassium: 3.7 mEq/L (ref 3.7–5.3)
SODIUM: 139 meq/L (ref 137–147)

## 2013-03-29 LAB — CBC
HCT: 33.4 % — ABNORMAL LOW (ref 36.0–46.0)
Hemoglobin: 11.4 g/dL — ABNORMAL LOW (ref 12.0–15.0)
MCH: 28.6 pg (ref 26.0–34.0)
MCHC: 34.1 g/dL (ref 30.0–36.0)
MCV: 83.9 fL (ref 78.0–100.0)
PLATELETS: 167 10*3/uL (ref 150–400)
RBC: 3.98 MIL/uL (ref 3.87–5.11)
RDW: 14 % (ref 11.5–15.5)
WBC: 4.8 10*3/uL (ref 4.0–10.5)

## 2013-03-29 LAB — TSH: TSH: 1.367 u[IU]/mL (ref 0.350–4.500)

## 2013-03-29 MED ORDER — ASPIRIN 81 MG PO TBEC: 81.0000 mg | DELAYED_RELEASE_TABLET | Freq: Every day | ORAL | Status: DC

## 2013-03-29 NOTE — Progress Notes (Signed)
Subjective:  No further episodes of atrial fibrillation. No further diarrhea, still on liquid diet. Patient feels well.  Objective:  Vital Signs in the last 24 hours: Temp:  [98 F (36.7 C)-98.8 F (37.1 C)] 98 F (36.7 C) (03/08 0520) Pulse Rate:  [83-99] 90 (03/08 0520) Resp:  [8-21] 18 (03/08 0520) BP: (90-132)/(47-86) 118/74 mmHg (03/08 0520) SpO2:  [96 %-100 %] 96 % (03/08 0520)  Intake/Output from previous day: 03/07 0701 - 03/08 0700 In: 1358.8 [P.O.:350; I.V.:1008.8] Out: 1325 [Urine:1325]  Physical Exam:  General appearance: alert, cooperative, appears stated age and no distress  Lungs: clear to auscultation bilaterally  Heart: regular rate and rhythm, S1, S2 normal, no murmur, click, rub or gallop  Abdomen: soft, non-tender; bowel sounds normal; no masses, no organomegaly  Extremities: extremities normal, atraumatic, no cyanosis or edema  Pulses: 2+ and symmetric  Lab Results:  Recent Labs  03/28/13 1015 03/29/13 0535  WBC 5.0 4.8  HGB 11.7* 11.4*  PLT 164 167    Recent Labs  03/28/13 1015 03/29/13 0535  NA 141 139  K 3.5* 3.7  CL 107 103  CO2 23 27  GLUCOSE 92 97  BUN 8 5*  CREATININE 0.59 0.63   No results found for this basename: TROPONINI, CK, MB,  in the last 72 hours Hepatic Function Panel  Recent Labs  03/28/13 1015  PROT 5.7*  ALBUMIN 2.6*  AST 19  ALT 13  ALKPHOS 67  BILITOT 0.3   No results found for this basename: CHOL,  in the last 72 hours No results found for this basename: PROTIME,  in the last 72 hours Lipid Panel  No results found for this basename: chol, trig, hdl, cholhdl, vldl, ldlcalc   EKG pending this morning.  Assessment/Plan:  1. Brief episode of atrial fibrillation with rapid ventricular response, patient presenting with severe dehydration, severe diarrhea and borderline hypokalemia. No recurrence, spontaneously converted to sinus with intravenous Cardizem. Patient presently not on any medications for the  same.  Recommendation: As no etiology has been found and is a second episode of diarrhea, abnormal CT of the abdomen, patient is presently undergoing 24-hour urinary collection for 5 HIAA evaluation. Blood serotonin level pending. From cardiac standpoint she can be discharged home after the urinary collection is done, I can certainly set her up to be further evaluated in the outpatient basis with regard to brief atrial fibrillation.  I have ordered EKG, unless this is abnormal, then she'll need serial troponin measurements, however it EKG is normal no further evaluation is indicated. Patient never had any chest pain or shortness of breath. No significant cardiovascular risk factors.   Laverda Page, M.D. 03/29/2013, 11:51 AM [facility] Pager: [phone] Office: [phone] If no answer: [phone]

## 2013-03-29 NOTE — Progress Notes (Signed)
24 hr urine taken to the lab. Pts assessment unchanged from this am. D/c'd home with daughter via wheelchair to private vehicle. No further questions per pt or family

## 2013-03-29 NOTE — Progress Notes (Signed)
Patient evaluated and case discussed with Dr Einar Gip. Patient had a brief episode of A-fib. She will go home with NO new medications. Dr Einar Gip has ordered a 24 hr urine for 5HIAA and serum Seratonin which he will f/u in his office. Please see updated discharge summary.   N[place]ad [facility]

## 2013-03-29 NOTE — Discharge Summary (Signed)
Physician Discharge Summary  MARCHE HOTTENSTEIN YQM:[id number] DOB: May 14, 1927 DOA: 03/28/2013  PCP: [name], MD  Admit date: 03/28/2013 Discharge date: 03/29/2013  Time spent: >45 minutes   Discharge Diagnoses:  Principal Problem:   Atrial fibrillation with RVR Active Problems:   Hypothyroidism   Hypokalemia   Acute gastroenteritis    Discharge Condition: stable  Diet recommendation: heart healthy  Filed Weights   03/28/13 0936  Weight: 70.308 kg (155 lb)    History of present illness:  80 year old female with a history of GI bleed, glaucoma, and hypothyroidism presented to the ED with one episode of diarrhea without any hematochezia or melena. The patient was discharged from the hospital after a stay from 03/25/13 to 03/27/2013 for gastroenteritis, syncope and dehydration. The patient began having diarrhea again after returning home. She states that she had 15 episodes without any hematochezia or melena. She had generalized weakness and some dizziness. As a result, she came to the ED for further evaluation. She was found to have atrial fibrillation with rapid ventricular response with HR 180. The patient was given Cardizem bolus and then start her on Cardizem drip. She spontaneously converted to sinus rhythm. She remains on Cardizem drip at 5 mg per hour with a heart rate of 90. She denies any chest discomfort, shortness of breath, nausea, vomiting. She did not have any syncope yesterday. She denies any abdominal pain, dysuria, hematuria, hematochezia, melena. The patient denies any recent antibiotics patient has not had a recent travels or eating any raw or undercooked foods. She states that her son had a recent GI type illness with diarrhea. During her last admission, the patient had a syncopal episode which was thought to be related to her dehydration from diarrhea. In addition, the patient relates a history of syncope approximately one year ago. Pt denies any previous hx of afib or  cardiac issues.  In the ED, CMP was unremarkable. CBC was unremarkable. She remained in sinus rhythm. Blood pressure remains soft with systolic 889-169. As noted the patient's blood pressure at the time of admission was 140/119 and decreased with diltiazem drip  Hospital Course:  Paroxysmal atrial fibrillation with RVR  -Spontaneously converted on Cardizem drip  -Dr. Einar Gip  Consulted- hold off on anticoagulation- he will see her in the office  -CHADS VASc= 3  -TSH - normal  Diarrhea/Gastroenteritis / Hypokalemia -03/25/2013 Clostridium difficile PCR was negative  -resolved soon after this admission -IVF given and K replaced - f/u on 5HIAA and serum serotonin levels ordered by Dr Einar Gip  Hypothyroidism  -Continue Synthroid  - TSH normal  Glaucoma  -Continue home eyedrops    Consultations:  cardiology  Discharge Exam: Filed Vitals:   03/29/13 0520  BP: 118/74  Pulse: 90  Temp: 98 F (36.7 C)  Resp: 18    General: AAO x 3 -  Cardiovascular: RRR, no murmurs Respiratory: CTA b/l   Discharge Instructions      Discharge Orders   Future Orders Complete By Expires   Diet - low sodium heart healthy  As directed    Increase activity slowly  As directed        Medication List         calcium-vitamin D 500-200 MG-UNIT per tablet  Commonly known as:  OSCAL WITH D  Take 1 tablet by mouth every morning.     latanoprost 0.005 % ophthalmic solution  Commonly known as:  XALATAN  Place 1 drop into both eyes at bedtime.  levothyroxine 100 MCG tablet  Commonly known as:  SYNTHROID, LEVOTHROID  Take 100 mcg by mouth daily before breakfast.     loratadine 10 MG tablet  Commonly known as:  CLARITIN  Take 10 mg by mouth daily as needed for allergies.     multivitamin with minerals Tabs tablet  Take 1 tablet by mouth every morning.     timolol 0.5 % ophthalmic solution  Commonly known as:  TIMOPTIC  Place 1 drop into both eyes 2 (two) times daily.        Allergies  Allergen Reactions  . Penicillins     Lost hearing temporarily  . Codeine Palpitations   Follow-up Information   Schedule an appointment as soon as possible for a visit with Laverda Page, MD. (For evaluation of Atrial Fibrillation and follow up.)    Specialty:  Cardiology   Contact information:   [address] [place] [address] [phone]        The results of significant diagnostics from this hospitalization (including imaging, microbiology, ancillary and laboratory) are listed below for reference.    Significant Diagnostic Studies: Ct Abdomen Pelvis W Contrast  03/26/2013   CLINICAL DATA:  Nausea, vomiting and abdominal pain.  EXAM: CT ABDOMEN AND PELVIS WITH CONTRAST  TECHNIQUE: Multidetector CT imaging of the abdomen and pelvis was performed using the standard protocol following bolus administration of intravenous contrast.  CONTRAST:  171mL OMNIPAQUE IOHEXOL 300 MG/ML  SOLN  COMPARISON:  None.  FINDINGS: The lung bases are clear of acute process. Mild scarring changes are noted. The heart is within normal limits in size for age. Significant pectus deformity noted. The distal esophagus is grossly normal.  Low-attenuation liver lesions measure as simple cysts. No worrisome hepatic lesions or intrahepatic biliary dilatation. The portal vein is normal. The gallbladder demonstrates multiple gallstones. No common bile duct dilatation. The pancreas is unremarkable. The spleen is normal in size. No focal lesions. The adrenal glands and kidneys are unremarkable. Small renal cysts are noted.  The stomach, duodenum, small bowel and colon are unremarkable. No inflammatory changes, mass lesions or obstructive findings. There is significant upper sigmoid colonic diverticulosis but no findings for acute diverticulitis. The appendix is normal. No mesenteric or retroperitoneal mass or adenopathy. There are calcified lesions in the small bowel mesentery. The largest  measures 3 cm. These could be calcified lymph nodes or possibly treated carcinoid tumor. No soft tissue component is identified. Moderate atherosclerotic calcifications involving the aorta. No focal aneurysm or dissection.  The uterus and ovaries are unremarkable. No pelvic mass, adenopathy or free pelvic fluid collections no inguinal mass or adenopathy.  The bony structures are unremarkable. There are compression fractures of L1 and L3 and moderate osteoporosis. These fractures are present on the prior MRI from 10/22/2012.  IMPRESSION: 1. No acute abdominal/pelvic findings, mass lesions or adenopathy. 2. Benign appearing hepatic cysts. 3. Cholelithiasis. 4. Calcified mesenteric lesions most likely benign calcified lymph nodes. No soft tissue component and no adenopathy. 5. Remote compression fractures of L1 and L3.   Electronically Signed   By: Kalman Jewels M.D.   On: 03/26/2013 16:35   Dg Chest Portable 1 View  03/28/2013   CLINICAL DATA:  Diarrhea and palpitations.  Weakness  EXAM: PORTABLE CHEST - 1 VIEW  COMPARISON:  10/08/2011  FINDINGS: The heart size and mediastinal contours are within normal limits. Calcification within the aortic arch is identified. Both lungs are clear. The visualized skeletal structures are unremarkable.  IMPRESSION:  1. No acute findings. 2. Atherosclerosis.   Electronically Signed   By: Kerby Moors M.D.   On: 03/28/2013 10:16    Microbiology: Recent Results (from the past 240 hour(s))  CLOSTRIDIUM DIFFICILE BY PCR     Status: None   Collection Time    03/25/13  4:22 AM      Result Value Ref Range Status   C difficile by pcr NEGATIVE  NEGATIVE Final   Comment: Performed at [facility]: Basic Metabolic Panel:  Recent Labs Lab 03/24/13 1638 03/25/13 0412 03/26/13 0335 03/27/13 0332 03/28/13 1015 03/28/13 1545 03/29/13 0535  NA 139 142 138 137 141  --  139  K 3.6* 3.8 4.2 4.4 3.5*  --  3.7  CL 98 107 108 105 107  --  103  CO2 26 22 20 19  23   --  27  GLUCOSE 141* 105* 85 64* 92  --  97  BUN 17 20 12 8 8   --  5*  CREATININE 0.81 0.68 0.67 0.63 0.59  --  0.63  CALCIUM 9.7 8.2* 8.2* 8.2* 7.9*  --  8.4  MG  --  1.6  --   --   --  1.5  --    Liver Function Tests:  Recent Labs Lab 03/24/13 1638 03/28/13 1015  AST 15 19  ALT 10 13  ALKPHOS 107 67  BILITOT 0.3 0.3  PROT 7.4 5.7*  ALBUMIN 3.7 2.6*    Recent Labs Lab 03/24/13 1605  LIPASE 21   No results found for this basename: AMMONIA,  in the last 168 hours CBC:  Recent Labs Lab 03/24/13 1605 03/25/13 0412 03/26/13 0335 03/27/13 0332 03/28/13 1015 03/29/13 0535  WBC 15.8* 9.8 5.3 4.9 5.0 4.8  NEUTROABS 14.2*  --   --   --   --   --   HGB 13.3 11.1* 10.5* 10.2* 11.7* 11.4*  HCT 40.1 33.6* 33.2* 31.1* 34.9* 33.4*  MCV 85.0 85.5 86.9 85.9 83.3 83.9  PLT 195 157 144* 147* 164 167   Cardiac Enzymes: No results found for this basename: CKTOTAL, CKMB, CKMBINDEX, TROPONINI,  in the last 168 hours BNP: BNP (last 3 results) No results found for this basename: PROBNP,  in the last 8760 hours CBG:  Recent Labs Lab 03/24/13 1615  GLUCAP 120*       SignedDebbe Odea, MD [facility] 03/29/2013, 3:29 PM

## 2013-04-03 LAB — 5 HIAA, QUANTITATIVE, URINE, 24 HOUR: VOLUME, URINE-5HIAA: 3300 mL/(24.h)

## 2013-04-04 NOTE — ED Provider Notes (Signed)
CSN: [id number]     Arrival date & time 03/24/13  1549 History   First MD Initiated Contact with Patient 03/24/13 1552     Chief Complaint  Patient presents with  . Emesis  . Diarrhea      HPI  Pt with sudden onset of N/V/D this morning.  Had one episode of fecal incontinence that came on with sudden urgence.  Pt unable to get to bathroom due to weakness, and orthostasis.  No syncope.  Non bloody stools.  Heme negative, non bilious emesis.  Past Medical History  Diagnosis Date  . Blood transfusion without reported diagnosis   . Thyroid disease   . GI bleed   . Glaucoma    Past Surgical History  Procedure Laterality Date  . Joint replacement  11/20/2011    LTKR  . Colonoscopy     History reviewed. No pertinent family history. History  Substance Use Topics  . Smoking status: Never Smoker   . Smokeless tobacco: Never Used  . Alcohol Use: No   OB History   Grav Para Term Preterm Abortions TAB SAB Ect Mult Living                 Review of Systems  Constitutional: Negative for fever, chills, diaphoresis, appetite change and fatigue.  HENT: Negative for mouth sores, sore throat and trouble swallowing.   Eyes: Negative for visual disturbance.  Respiratory: Negative for cough, chest tightness, shortness of breath and wheezing.   Cardiovascular: Negative for chest pain.  Gastrointestinal: Positive for nausea, vomiting and diarrhea. Negative for abdominal pain and abdominal distention.  Endocrine: Negative for polydipsia, polyphagia and polyuria.  Genitourinary: Negative for dysuria, frequency and hematuria.  Musculoskeletal: Negative for gait problem.  Skin: Negative for color change, pallor and rash.  Neurological: Negative for dizziness, syncope, light-headedness and headaches.  Hematological: Does not bruise/bleed easily.  Psychiatric/Behavioral: Negative for behavioral problems and confusion.      Allergies  Penicillins and Codeine  Home Medications   Current  Outpatient Rx  Name  Route  Sig  Dispense  Refill  . calcium-vitamin D (OSCAL WITH D) 500-200 MG-UNIT per tablet   Oral   Take 1 tablet by mouth every morning.         . latanoprost (XALATAN) 0.005 % ophthalmic solution   Both Eyes   Place 1 drop into both eyes at bedtime.         Marland Kitchen levothyroxine (SYNTHROID, LEVOTHROID) 100 MCG tablet   Oral   Take 100 mcg by mouth daily before breakfast.         . Multiple Vitamin (MULTIVITAMIN WITH MINERALS) TABS   Oral   Take 1 tablet by mouth every morning.         . timolol (TIMOPTIC) 0.5 % ophthalmic solution   Both Eyes   Place 1 drop into both eyes 2 (two) times daily.         Marland Kitchen loratadine (CLARITIN) 10 MG tablet   Oral   Take 10 mg by mouth daily as needed for allergies.          BP 124/65  Pulse 85  Temp(Src) 98.3 F (36.8 C) (Oral)  Resp 18  Ht 5\' 2"  (1.575 m)  Wt 151 lb 7.3 oz (68.7 kg)  BMI 27.69 kg/m2  SpO2 97% Physical Exam  Constitutional: She is oriented to person, place, and time. She appears well-developed and well-nourished. No distress.  HENT:  Head: Normocephalic.  Eyes: Conjunctivae are normal. Pupils  are equal, round, and reactive to light. No scleral icterus.  Neck: Normal range of motion. Neck supple. No thyromegaly present.  Cardiovascular: Normal rate and regular rhythm.  Exam reveals no gallop and no friction rub.   No murmur heard. Pulmonary/Chest: Effort normal and breath sounds normal. No respiratory distress. She has no wheezes. She has no rales.  Abdominal: Soft. Bowel sounds are normal. She exhibits no distension. There is tenderness. There is no rebound.  Mild diffuse tenderness.  No localizing pain.  No peritoneal irritation.  Musculoskeletal: Normal range of motion.  Neurological: She is alert and oriented to person, place, and time.  Skin: Skin is warm and dry. No rash noted.  Psychiatric: She has a normal mood and affect. Her behavior is normal.    ED Course  Procedures  (including critical care time) Labs Review Labs Reviewed  CBC WITH DIFFERENTIAL - Abnormal; Notable for the following:    WBC 15.8 (*)    Neutrophils Relative % 89 (*)    Neutro Abs 14.2 (*)    Lymphocytes Relative 4 (*)    All other components within normal limits  URINALYSIS, ROUTINE W REFLEX MICROSCOPIC - Abnormal; Notable for the following:    APPearance CLOUDY (*)    Hgb urine dipstick MODERATE (*)    All other components within normal limits  COMPREHENSIVE METABOLIC PANEL - Abnormal; Notable for the following:    Potassium 3.6 (*)    Glucose, Bld 141 (*)    GFR calc non Af Amer 65 (*)    GFR calc Af Amer 76 (*)    All other components within normal limits  TSH - Abnormal; Notable for the following:    TSH 0.224 (*)    All other components within normal limits  BASIC METABOLIC PANEL - Abnormal; Notable for the following:    Glucose, Bld 105 (*)    Calcium 8.2 (*)    GFR calc non Af Amer 79 (*)    All other components within normal limits  CBC - Abnormal; Notable for the following:    Hemoglobin 11.1 (*)    HCT 33.6 (*)    All other components within normal limits  GLUCOSE, CAPILLARY - Abnormal; Notable for the following:    Glucose-Capillary 120 (*)    All other components within normal limits  CBC - Abnormal; Notable for the following:    RBC 3.82 (*)    Hemoglobin 10.5 (*)    HCT 33.2 (*)    Platelets 144 (*)    All other components within normal limits  BASIC METABOLIC PANEL - Abnormal; Notable for the following:    Calcium 8.2 (*)    GFR calc non Af Amer 79 (*)    All other components within normal limits  CBC - Abnormal; Notable for the following:    RBC 3.62 (*)    Hemoglobin 10.2 (*)    HCT 31.1 (*)    Platelets 147 (*)    All other components within normal limits  BASIC METABOLIC PANEL - Abnormal; Notable for the following:    Glucose, Bld 64 (*)    Calcium 8.2 (*)    GFR calc non Af Amer 81 (*)    All other components within normal limits   CLOSTRIDIUM DIFFICILE BY PCR  STOOL CULTURE  OVA AND PARASITE EXAMINATION  LIPASE, BLOOD  URINE MICROSCOPIC-ADD ON  MAGNESIUM  CBG MONITORING, ED   Imaging Review No results found.   EKG Interpretation   Date/Time:  Tuesday  March 24 2013 16:11:17 EST Ventricular Rate:  79 PR Interval:  146 QRS Duration: 95 QT Interval:  424 QTC Calculation: 486 R Axis:   7 Text Interpretation:  Sinus rhythm Multiform ventricular premature  complexes Probable left atrial enlargement Left ventricular hypertrophy  Borderline T abnormalities, inferior leads Borderline prolonged QT  interval ED PHYSICIAN INTERPRETATION AVAILABLE IN [facility]  Confirmed by TEST, Record ([id number]) on 03/26/2013 7:27:24 AM      MDM   Final diagnoses:  Gastroenteritis    Pt given IVF, anti-emetics, Lomotil.  One additional episode of emesis.  Additional diarrhea also.  With the weakness, her age, and continued symptoms, I felt it most prudent to admit her to the hospital for rehydration and symptom control.  Pt admitted with continued symptoms.    Tanna Furry, MD 04/04/13 [phone]

## 2014-01-23 ENCOUNTER — Emergency Department (HOSPITAL_COMMUNITY)
Admission: EM | Admit: 2014-01-23 | Discharge: 2014-01-23 | Disposition: A | Discharge status: Home / Self Care | Payer: Medicare Other | Attending: Emergency Medicine | Admitting: Emergency Medicine

## 2014-01-23 ENCOUNTER — Encounter (HOSPITAL_COMMUNITY): Payer: Self-pay | Admitting: Emergency Medicine

## 2014-01-23 ENCOUNTER — Emergency Department (HOSPITAL_COMMUNITY): Payer: Medicare Other

## 2014-01-23 DIAGNOSIS — R42 Dizziness and giddiness: Secondary | ICD-10-CM | POA: Diagnosis not present

## 2014-01-23 DIAGNOSIS — H409 Unspecified glaucoma: Secondary | ICD-10-CM | POA: Insufficient documentation

## 2014-01-23 DIAGNOSIS — Z79899 Other long term (current) drug therapy: Secondary | ICD-10-CM | POA: Diagnosis not present

## 2014-01-23 DIAGNOSIS — W1839XA Other fall on same level, initial encounter: Secondary | ICD-10-CM | POA: Insufficient documentation

## 2014-01-23 DIAGNOSIS — Z791 Long term (current) use of non-steroidal anti-inflammatories (NSAID): Secondary | ICD-10-CM | POA: Insufficient documentation

## 2014-01-23 DIAGNOSIS — Z8719 Personal history of other diseases of the digestive system: Secondary | ICD-10-CM | POA: Diagnosis not present

## 2014-01-23 DIAGNOSIS — Y998 Other external cause status: Secondary | ICD-10-CM | POA: Insufficient documentation

## 2014-01-23 DIAGNOSIS — S3992XA Unspecified injury of lower back, initial encounter: Secondary | ICD-10-CM | POA: Diagnosis not present

## 2014-01-23 DIAGNOSIS — Y9389 Activity, other specified: Secondary | ICD-10-CM | POA: Insufficient documentation

## 2014-01-23 DIAGNOSIS — W19XXXA Unspecified fall, initial encounter: Secondary | ICD-10-CM

## 2014-01-23 DIAGNOSIS — Y9289 Other specified places as the place of occurrence of the external cause: Secondary | ICD-10-CM | POA: Insufficient documentation

## 2014-01-23 DIAGNOSIS — M545 Low back pain: Secondary | ICD-10-CM

## 2014-01-23 DIAGNOSIS — E079 Disorder of thyroid, unspecified: Secondary | ICD-10-CM | POA: Insufficient documentation

## 2014-01-23 DIAGNOSIS — Z880 Allergy status to penicillin: Secondary | ICD-10-CM | POA: Diagnosis not present

## 2014-01-23 LAB — CBC
HCT: 38.7 % (ref 36.0–46.0)
Hemoglobin: 12.7 g/dL (ref 12.0–15.0)
MCH: 28.1 pg (ref 26.0–34.0)
MCHC: 32.8 g/dL (ref 30.0–36.0)
MCV: 85.6 fL (ref 78.0–100.0)
Platelets: 241 10*3/uL (ref 150–400)
RBC: 4.52 MIL/uL (ref 3.87–5.11)
RDW: 13.1 % (ref 11.5–15.5)
WBC: 7.6 10*3/uL (ref 4.0–10.5)

## 2014-01-23 LAB — BASIC METABOLIC PANEL
Anion gap: 10 (ref 5–15)
BUN: 15 mg/dL (ref 6–23)
CALCIUM: 9.2 mg/dL (ref 8.4–10.5)
CO2: 28 mmol/L (ref 19–32)
Chloride: 99 mEq/L (ref 96–112)
Creatinine, Ser: 0.6 mg/dL (ref 0.50–1.10)
GFR calc Af Amer: >90 mL/min (ref >90)
GFR calc non Af Amer: 81 mL/min — ABNORMAL LOW (ref >90)
GLUCOSE: 102 mg/dL — AB (ref 70–99)
Potassium: 3.9 mmol/L (ref 3.5–5.1)
Sodium: 137 mmol/L (ref 135–145)

## 2014-01-23 NOTE — ED Provider Notes (Signed)
CSN: [id number]     Arrival date & time 01/23/14  1257 History   First MD Initiated Contact with Patient 01/23/14 1504     Chief Complaint  Patient presents with  . Fall  . Near Syncope  . Back Pain      HPI Patient presents to the emergency department complaining of mild ongoing low back and low pelvic pain over the past several days.  Her fall was 10 days ago.  She's had mild dizziness over the past 24 hours.  She denies initial head injury.  She is not on anticoagulants.  She denies neck pain.  No weakness of her upper lower extremities.  She feels slightly more off balance than usual for her.  Normally she walks with a cane.  She had a sense of lightheadedness and dizziness today without syncope.  No chest pain or shortness breath.  No preceding palpitations.   Past Medical History  Diagnosis Date  . Blood transfusion without reported diagnosis   . Thyroid disease   . GI bleed   . Glaucoma    Past Surgical History  Procedure Laterality Date  . Joint replacement  11/20/2011    LTKR  . Colonoscopy     History reviewed. No pertinent family history. History  Substance Use Topics  . Smoking status: Never Smoker   . Smokeless tobacco: Never Used  . Alcohol Use: No   OB History    No data available     Review of Systems  All other systems reviewed and are negative.     Allergies  Penicillins and Codeine  Home Medications   Prior to Admission medications   Medication Sig Start Date End Date Taking? Authorizing Provider  calcium-vitamin D (OSCAL WITH D) 500-200 MG-UNIT per tablet Take 1 tablet by mouth every morning.   Yes Historical Provider, MD  Diclofenac Sodium (PENNSAID TD) Place 1 application onto the skin daily as needed (knee pain.).   Yes Historical Provider, MD  ibuprofen (ADVIL,MOTRIN) 200 MG tablet Take 400 mg by mouth every 6 (six) hours as needed for headache or moderate pain.   Yes Historical Provider, MD  latanoprost (XALATAN) 0.005 % ophthalmic  solution Place 1 drop into both eyes at bedtime.   Yes Historical Provider, MD  levothyroxine (SYNTHROID, LEVOTHROID) 100 MCG tablet Take 100 mcg by mouth daily before breakfast.   Yes Historical Provider, MD  Multiple Vitamin (MULTIVITAMIN WITH MINERALS) TABS Take 1 tablet by mouth every morning.   Yes Historical Provider, MD  naproxen sodium (ANAPROX) 220 MG tablet Take 440 mg by mouth daily.   Yes Historical Provider, MD  timolol (TIMOPTIC) 0.5 % ophthalmic solution Place 1 drop into both eyes 2 (two) times daily.   Yes Historical Provider, MD  loratadine (CLARITIN) 10 MG tablet Take 10 mg by mouth daily as needed for allergies.    Historical Provider, MD   BP 163/92 mmHg  Pulse 102  Temp(Src) 98.6 F (37 C) (Oral)  Resp 22  SpO2 98% Physical Exam  Constitutional: She is oriented to person, place, and time. She appears well-developed and well-nourished. No distress.  HENT:  Head: Normocephalic and atraumatic.  Eyes: EOM are normal. Pupils are equal, round, and reactive to light.  Neck: Normal range of motion.  Cardiovascular: Normal rate, regular rhythm and normal heart sounds.   Pulmonary/Chest: Effort normal and breath sounds normal.  Abdominal: Soft. She exhibits no distension. There is no tenderness.  Musculoskeletal: Normal range of motion.  Full range of  motion of bilateral ankles, knees, hips.  Neurological: She is alert and oriented to person, place, and time.  5/5 strength in major muscle groups of  bilateral upper and lower extremities. Speech normal. No facial asymetry.   Skin: Skin is warm and dry.  Psychiatric: She has a normal mood and affect. Judgment normal.  Nursing note and vitals reviewed.   ED Course  Procedures (including critical care time) Labs Review Labs Reviewed  BASIC METABOLIC PANEL - Abnormal; Notable for the following:    Glucose, Bld 102 (*)    GFR calc non Af Amer 81 (*)    All other components within normal limits  CBC    Imaging  Review Dg Lumbar Spine Complete  01/23/2014   CLINICAL DATA:  pain, generally and centrally + right hip pain s/p fall x 8 days ago in the parking lot outside her daughters house, reports prior hx fx to her lumbar spine distantly.  EXAM: LUMBAR SPINE - COMPLETE 4+ VIEW  COMPARISON:  CT 03/26/2013  FINDINGS: Little change in moderately severe L1 and moderate L3 compression fracture deformities. No new fracture. Mild narrowing of L4-5 interspace as before. Patchy aortic calcifications without suggestion of aneurysm. Calcified mesenteric lymph nodes at the level of L5. Bilateral pelvic phleboliths. Bilateral hip degenerative changes right greater than left, incompletely visualized.  IMPRESSION: 1. Stable L1 and L3 compression fracture deformities and degenerative changes. 2. No acute fracture   Electronically Signed   By: Arne Cleveland M.D.   On: 01/23/2014 16:29   Dg Hip Complete Right  01/23/2014   CLINICAL DATA:  pain, generally and centrally + right hip pain s/p fall x 8 days ago in the parking lot outside her daughters house, reports prior hx fx to her lumbar spine distantly.  EXAM: RIGHT HIP - COMPLETE 2+ VIEW  COMPARISON:  08/06/2012  FINDINGS: Narrowing of the articular cartilage in both hips right worse than left, with small marginal spurs. Negative for fracture or dislocation. Calcified mesenteric nodes project over L5. Right pelvic phleboliths.  IMPRESSION: 1. Bilateral hip degenerative change right greater than left without fracture ,dislocation, or other acute abnormality   Electronically Signed   By: Arne Cleveland M.D.   On: 01/23/2014 16:32   Ct Head Wo Contrast  01/23/2014   CLINICAL DATA:  Dizziness.  Posterior head pain.  EXAM: CT HEAD WITHOUT CONTRAST  TECHNIQUE: Contiguous axial images were obtained from the base of the skull through the vertex without intravenous contrast.  COMPARISON:  12/01/2007  FINDINGS: Sinuses/Soft tissues: Clear paranasal sinuses and mastoid air cells.  Intracranial:  Mild to moderate low density in the periventricular white matter likely related to small vessel disease. This is progressive since the prior. No mass lesion, hemorrhage, hydrocephalus, acute infarct, intra-axial, or extra-axial fluid collection.  IMPRESSION: 1.  No acute intracranial abnormality. 2. Progressive small vessel ischemic change.   Electronically Signed   By: Abigail Miyamoto M.D.   On: 01/23/2014 17:04     EKG Interpretation   Date/Time:  Saturday January 23 2014 13:16:17 EST Ventricular Rate:  100 PR Interval:  141 QRS Duration: 80 QT Interval:  359 QTC Calculation: 463 R Axis:   43 Text Interpretation:  Sinus tachycardia Low voltage, precordial leads RSR'  in V1 or V2, right VCD or RVH no longer in afib as compared to prior ecg  Confirmed by Churchill Grimsley  MD, Lennette Bihari ([id number]) on 01/23/2014 5:09:28 PM      MDM   Final diagnoses:  None  Overall the patient is well-appearing.  Her labs, imaging, EKG are normal.  She is ambulatory in the emergency department.  I got her up and walked her.  She is not very unsteady at all.  She denied lightheadedness.  Discharge home with primary care follow-up.    Hoy Morn, MD 01/23/14 [phone]

## 2014-01-23 NOTE — ED Notes (Addendum)
Patient had fall last Sunday and was seen at PCP on Tuesday. MD had no scans or xrays done at PCP. C/o lower back pain. Says she is unable to lay on the right side but can lay on the left. Also today reports she felt like she was going to pass out. Feels dizziness at this time. Did not hit head when she fell. Took 400 mg Ibuprofen this morning. Also c/o posterior head pain. Not on blood thinners. Has old, healing bruise on left knee. RR even/unlabored. A&Ox4. Speaking full/clear sentences. Face symmetrical. Neurologically intact.

## 2014-01-23 NOTE — ED Notes (Signed)
Below order not completed by EW. 

## 2014-03-10 ENCOUNTER — Emergency Department (HOSPITAL_COMMUNITY)
Admission: EM | Admit: 2014-03-10 | Discharge: 2014-03-10 | Disposition: A | Discharge status: Home / Self Care | Payer: Medicare Other | Attending: Emergency Medicine | Admitting: Emergency Medicine

## 2014-03-10 ENCOUNTER — Encounter (HOSPITAL_COMMUNITY): Payer: Self-pay | Admitting: Emergency Medicine

## 2014-03-10 DIAGNOSIS — H409 Unspecified glaucoma: Secondary | ICD-10-CM | POA: Insufficient documentation

## 2014-03-10 DIAGNOSIS — M5432 Sciatica, left side: Secondary | ICD-10-CM | POA: Insufficient documentation

## 2014-03-10 DIAGNOSIS — Z880 Allergy status to penicillin: Secondary | ICD-10-CM | POA: Diagnosis not present

## 2014-03-10 DIAGNOSIS — M79605 Pain in left leg: Secondary | ICD-10-CM | POA: Diagnosis present

## 2014-03-10 DIAGNOSIS — E079 Disorder of thyroid, unspecified: Secondary | ICD-10-CM | POA: Diagnosis not present

## 2014-03-10 DIAGNOSIS — G8911 Acute pain due to trauma: Secondary | ICD-10-CM | POA: Insufficient documentation

## 2014-03-10 DIAGNOSIS — Z8719 Personal history of other diseases of the digestive system: Secondary | ICD-10-CM | POA: Diagnosis not present

## 2014-03-10 DIAGNOSIS — Z79899 Other long term (current) drug therapy: Secondary | ICD-10-CM | POA: Diagnosis not present

## 2014-03-10 MED ORDER — HYDROCODONE-ACETAMINOPHEN 5-325 MG PO TABS: 1.0000 | ORAL_TABLET | ORAL | Status: DC | PRN

## 2014-03-10 NOTE — ED Notes (Signed)
Pt c/o lower back pain that radiates down her left leg. Pt states that she was seen here 7 weeks ago after a fall.  Pt states that she is having pain that is causing her a hard time with her ADLs.  Pt states that her PCP recommended PT, but pt states that she can hardly walk so no why she can drive across down.  Pt saw chiropractor yesterday and was told by him that she has some older fractures and should have an MRI on her back bc she could have some newer ones.

## 2014-03-10 NOTE — Discharge Instructions (Signed)
Use heat on the sore area 3-4 times a day. Take Ibuprofen 400mg  3 times a day, for inflammation. Consider seeing Dr. Lynann Bologna to discuss getting an MRI. You can also discuss this with Dr. Jonni Sanger.  Sciatica Sciatica is pain, weakness, numbness, or tingling along the path of the sciatic nerve. The nerve starts in the lower back and runs down the back of each leg. The nerve controls the muscles in the lower leg and in the back of the knee, while also providing sensation to the back of the thigh, lower leg, and the sole of your foot. Sciatica is a symptom of another medical condition. For instance, nerve damage or certain conditions, such as a herniated disk or bone spur on the spine, pinch or put pressure on the sciatic nerve. This causes the pain, weakness, or other sensations normally associated with sciatica. Generally, sciatica only affects one side of the body. CAUSES   Herniated or slipped disc.  Degenerative disk disease.  A pain disorder involving the narrow muscle in the buttocks (piriformis syndrome).  Pelvic injury or fracture.  Pregnancy.  Tumor (rare). SYMPTOMS  Symptoms can vary from mild to very severe. The symptoms usually travel from the low back to the buttocks and down the back of the leg. Symptoms can include:  Mild tingling or dull aches in the lower back, leg, or hip.  Numbness in the back of the calf or sole of the foot.  Burning sensations in the lower back, leg, or hip.  Sharp pains in the lower back, leg, or hip.  Leg weakness.  Severe back pain inhibiting movement. These symptoms may get worse with coughing, sneezing, laughing, or prolonged sitting or standing. Also, being overweight may worsen symptoms. DIAGNOSIS  Your caregiver will perform a physical exam to look for common symptoms of sciatica. He or she may ask you to do certain movements or activities that would trigger sciatic nerve pain. Other tests may be performed to find the cause of the sciatica.  These may include:  Blood tests.  X-rays.  Imaging tests, such as an MRI or CT scan. TREATMENT  Treatment is directed at the cause of the sciatic pain. Sometimes, treatment is not necessary and the pain and discomfort goes away on its own. If treatment is needed, your caregiver may suggest:  Over-the-counter medicines to relieve pain.  Prescription medicines, such as anti-inflammatory medicine, muscle relaxants, or narcotics.  Applying heat or ice to the painful area.  Steroid injections to lessen pain, irritation, and inflammation around the nerve.  Reducing activity during periods of pain.  Exercising and stretching to strengthen your abdomen and improve flexibility of your spine. Your caregiver may suggest losing weight if the extra weight makes the back pain worse.  Physical therapy.  Surgery to eliminate what is pressing or pinching the nerve, such as a bone spur or part of a herniated disk. HOME CARE INSTRUCTIONS   Only take over-the-counter or prescription medicines for pain or discomfort as directed by your caregiver.  Apply ice to the affected area for 20 minutes, 3-4 times a day for the first 48-72 hours. Then try heat in the same way.  Exercise, stretch, or perform your usual activities if these do not aggravate your pain.  Attend physical therapy sessions as directed by your caregiver.  Keep all follow-up appointments as directed by your caregiver.  Do not wear high heels or shoes that do not provide proper support.  Check your mattress to see if it is too  soft. A firm mattress may lessen your pain and discomfort. SEEK IMMEDIATE MEDICAL CARE IF:   You lose control of your bowel or bladder (incontinence).  You have increasing weakness in the lower back, pelvis, buttocks, or legs.  You have redness or swelling of your back.  You have a burning sensation when you urinate.  You have pain that gets worse when you lie down or awakens you at night.  Your pain  is worse than you have experienced in the past.  Your pain is lasting longer than 4 weeks.  You are suddenly losing weight without reason. MAKE SURE YOU:  Understand these instructions.  Will watch your condition.  Will get help right away if you are not doing well or get worse. Document Released: 01/02/2001 Document Revised: 07/10/2011 Document Reviewed: 05/20/2011 [facility] Patient Information 2015 [place], [place]. This information is not intended to replace advice given to you by your health care provider. Make sure you discuss any questions you have with your health care provider.

## 2014-03-10 NOTE — ED Notes (Signed)
MD at bedside. 

## 2014-03-10 NOTE — ED Provider Notes (Signed)
CSN: [id number]     Arrival date & time 03/10/14  1427 History   First MD Initiated Contact with Patient 03/10/14 1514     Chief Complaint  Patient presents with  . Back Pain  . left leg pain      (Consider location/radiation/quality/duration/timing/severity/associated sxs/prior Treatment) HPI   Stephanie Donovan is a 81 y.o. female who presents for evaluation of left leg pain which she thinks is sciatica, which started about 10 days ago.  She had an injury around Christmastime, which she slipped and fell on ice.  Since that time she has had some lower left back pain and left leg pain.  She has been seen by her PCP who prescribed prednisone and hydrocodone, with only partial relief.  She also saw an orthopedist gave her an unknown pill, and yesterday so chiropractor recommended that she get an MRI.  She has had plain images of her back.  No other falls.  No bowel or urinary incontinence.  No fever, chills, nausea, vomiting, weakness or dizziness.  She is ambulating using a cane or a walker.  She is interested in getting an MRI done today.  She is taking her usual medications.  There are no other no known modifying factors.   Past Medical History  Diagnosis Date  . Blood transfusion without reported diagnosis   . Thyroid disease   . GI bleed   . Glaucoma    Past Surgical History  Procedure Laterality Date  . Joint replacement  11/20/2011    LTKR  . Colonoscopy     No family history on file. History  Substance Use Topics  . Smoking status: Never Smoker   . Smokeless tobacco: Never Used  . Alcohol Use: No   OB History    No data available     Review of Systems  All other systems reviewed and are negative.     Allergies  Penicillins and Codeine  Home Medications   Prior to Admission medications   Medication Sig Start Date End Date Taking? Authorizing Provider  ibuprofen (ADVIL,MOTRIN) 200 MG tablet Take 200 mg by mouth every 6 (six) hours as needed for headache or  moderate pain (pain).    Yes Historical Provider, MD  latanoprost (XALATAN) 0.005 % ophthalmic solution Place 1 drop into both eyes at bedtime.   Yes Historical Provider, MD  levothyroxine (SYNTHROID, LEVOTHROID) 100 MCG tablet Take 100 mcg by mouth daily before breakfast.   Yes Historical Provider, MD  Multiple Vitamin (MULTIVITAMIN WITH MINERALS) TABS Take 1 tablet by mouth every morning.   Yes Historical Provider, MD  timolol (TIMOPTIC) 0.5 % ophthalmic solution Place 1 drop into both eyes 2 (two) times daily.   Yes Historical Provider, MD  calcium-vitamin D (OSCAL WITH D) 500-200 MG-UNIT per tablet Take 1 tablet by mouth every morning.    Historical Provider, MD  HYDROcodone-acetaminophen (NORCO) 5-325 MG per tablet Take 1 tablet by mouth every 4 (four) hours as needed. 03/10/14   Richarda Blade, MD   BP 126/74 mmHg  Pulse 105  Temp(Src) 97.9 F (36.6 C) (Oral)  Resp 18  SpO2 95% Physical Exam  Constitutional: She is oriented to person, place, and time. She appears well-developed and well-nourished.  HENT:  Head: Normocephalic and atraumatic.  Right Ear: External ear normal.  Left Ear: External ear normal.  Eyes: Conjunctivae and EOM are normal. Pupils are equal, round, and reactive to light.  Neck: Normal range of motion and phonation normal. Neck supple.  Cardiovascular: Normal rate, regular rhythm and normal heart sounds.   Pulmonary/Chest: Effort normal and breath sounds normal. She exhibits no bony tenderness.  Abdominal: Soft. There is no tenderness.  Musculoskeletal: Normal range of motion. She exhibits no edema or tenderness.  Normal.  Straight leg raising actively and passively bilaterally.  No tenderness to the lumbar spine.  Neurological: She is alert and oriented to person, place, and time. No cranial nerve deficit or sensory deficit. She exhibits normal muscle tone. Coordination normal.  Skin: Skin is warm, dry and intact.  Psychiatric: She has a normal mood and affect.  Her behavior is normal. Judgment and thought content normal.  Nursing note and vitals reviewed.   ED Course  Procedures (including critical care time)   Findings discussed with patient, all questions answered.  Labs Review Labs Reviewed - No data to display  Imaging Review No results found.   EKG Interpretation None      MDM   Final diagnoses:  Sciatica, left    Sciatica, nonspecific, unlikely to represent acute lumbar radiculopathy or spinal myelopathy.  Doubt lumbar or hip fractures.   Nursing Notes Reviewed/ Care Coordinated Applicable Imaging Reviewed Interpretation of Laboratory Data incorporated into ED treatment  The patient appears reasonably screened and/or stabilized for discharge and I doubt any other medical condition or other [facility] requiring further screening, evaluation, or treatment in the ED at this time prior to discharge.  Plan: Home Medications- Norco; Home Treatments- heat, rest; return here if the recommended treatment, does not improve the symptoms; Recommended follow up- PCP or Ortho prn   Richarda Blade, MD 03/10/14 [phone]

## 2014-04-11 ENCOUNTER — Emergency Department (HOSPITAL_COMMUNITY): Payer: Medicare Other

## 2014-04-11 ENCOUNTER — Encounter (HOSPITAL_COMMUNITY): Payer: Self-pay

## 2014-04-11 ENCOUNTER — Inpatient Hospital Stay (HOSPITAL_COMMUNITY)
Admission: EM | Admit: 2014-04-11 | Discharge: 2014-04-14 | DRG: 536 | Disposition: A | Discharge status: Skilled Nursing Facility | Payer: Medicare Other | Attending: Internal Medicine | Admitting: Internal Medicine

## 2014-04-11 DIAGNOSIS — S32592A Other specified fracture of left pubis, initial encounter for closed fracture: Secondary | ICD-10-CM | POA: Diagnosis not present

## 2014-04-11 DIAGNOSIS — M25552 Pain in left hip: Secondary | ICD-10-CM | POA: Diagnosis present

## 2014-04-11 DIAGNOSIS — Z886 Allergy status to analgesic agent status: Secondary | ICD-10-CM

## 2014-04-11 DIAGNOSIS — H409 Unspecified glaucoma: Secondary | ICD-10-CM | POA: Diagnosis present

## 2014-04-11 DIAGNOSIS — R262 Difficulty in walking, not elsewhere classified: Secondary | ICD-10-CM

## 2014-04-11 DIAGNOSIS — S32599A Other specified fracture of unspecified pubis, initial encounter for closed fracture: Secondary | ICD-10-CM | POA: Diagnosis present

## 2014-04-11 DIAGNOSIS — S32512A Fracture of superior rim of left pubis, initial encounter for closed fracture: Secondary | ICD-10-CM | POA: Diagnosis not present

## 2014-04-11 DIAGNOSIS — W19XXXA Unspecified fall, initial encounter: Secondary | ICD-10-CM | POA: Diagnosis not present

## 2014-04-11 DIAGNOSIS — R52 Pain, unspecified: Secondary | ICD-10-CM

## 2014-04-11 DIAGNOSIS — Z880 Allergy status to penicillin: Secondary | ICD-10-CM | POA: Diagnosis not present

## 2014-04-11 DIAGNOSIS — S32509A Unspecified fracture of unspecified pubis, initial encounter for closed fracture: Secondary | ICD-10-CM | POA: Diagnosis present

## 2014-04-11 DIAGNOSIS — Y929 Unspecified place or not applicable: Secondary | ICD-10-CM | POA: Diagnosis not present

## 2014-04-11 DIAGNOSIS — S32502A Unspecified fracture of left pubis, initial encounter for closed fracture: Secondary | ICD-10-CM

## 2014-04-11 DIAGNOSIS — E039 Hypothyroidism, unspecified: Secondary | ICD-10-CM | POA: Diagnosis not present

## 2014-04-11 DIAGNOSIS — Z96652 Presence of left artificial knee joint: Secondary | ICD-10-CM | POA: Diagnosis not present

## 2014-04-11 DIAGNOSIS — G8929 Other chronic pain: Secondary | ICD-10-CM

## 2014-04-11 LAB — CBC WITH DIFFERENTIAL/PLATELET
BASOS ABS: 0.1 10*3/uL (ref 0.0–0.1)
Basophils Relative: 1 % (ref 0–1)
EOS ABS: 0.1 10*3/uL (ref 0.0–0.7)
Eosinophils Relative: 2 % (ref 0–5)
HCT: 37.6 % (ref 36.0–46.0)
Hemoglobin: 12.4 g/dL (ref 12.0–15.0)
LYMPHS ABS: 1.9 10*3/uL (ref 0.7–4.0)
LYMPHS PCT: 28 % (ref 12–46)
MCH: 28.1 pg (ref 26.0–34.0)
MCHC: 33 g/dL (ref 30.0–36.0)
MCV: 85.3 fL (ref 78.0–100.0)
MONO ABS: 0.8 10*3/uL (ref 0.1–1.0)
MONOS PCT: 12 % (ref 3–12)
NEUTROS ABS: 3.7 10*3/uL (ref 1.7–7.7)
Neutrophils Relative %: 57 % (ref 43–77)
PLATELETS: 222 10*3/uL (ref 150–400)
RBC: 4.41 MIL/uL (ref 3.87–5.11)
RDW: 13.6 % (ref 11.5–15.5)
WBC: 6.5 10*3/uL (ref 4.0–10.5)

## 2014-04-11 LAB — I-STAT CHEM 8, ED
BUN: 13 mg/dL (ref 6–23)
Calcium, Ion: 1.15 mmol/L (ref 1.13–1.30)
Chloride: 100 mmol/L (ref 96–112)
Creatinine, Ser: 0.6 mg/dL (ref 0.50–1.10)
Glucose, Bld: 96 mg/dL (ref 70–99)
HEMATOCRIT: 39 % (ref 36.0–46.0)
HEMOGLOBIN: 13.3 g/dL (ref 12.0–15.0)
Potassium: 3.9 mmol/L (ref 3.5–5.1)
Sodium: 138 mmol/L (ref 135–145)
TCO2: 23 mmol/L (ref 0–100)

## 2014-04-11 MED ORDER — CYCLOBENZAPRINE HCL 5 MG PO TABS
5.0000 mg | ORAL_TABLET | Freq: Two times a day (BID) | ORAL | Status: DC | PRN
  Administered 2014-04-11 – 2014-04-12 (×2): 5 mg via ORAL
  Filled 2014-04-11 (×2): qty 1

## 2014-04-11 MED ORDER — LEVOTHYROXINE SODIUM 100 MCG PO TABS
100.0000 ug | ORAL_TABLET | Freq: Every day | ORAL | Status: DC
  Administered 2014-04-12 – 2014-04-14 (×3): 100 ug via ORAL
  Filled 2014-04-11 (×4): qty 1

## 2014-04-11 MED ORDER — LATANOPROST 0.005 % OP SOLN
1.0000 [drp] | Freq: Every day | OPHTHALMIC | Status: DC
  Administered 2014-04-11 – 2014-04-13 (×3): 1 [drp] via OPHTHALMIC
  Filled 2014-04-11: qty 2.5

## 2014-04-11 MED ORDER — CALCIUM CARBONATE-VITAMIN D 500-200 MG-UNIT PO TABS
1.0000 | ORAL_TABLET | Freq: Every morning | ORAL | Status: DC
  Administered 2014-04-12 – 2014-04-14 (×3): 1 via ORAL
  Filled 2014-04-11 (×3): qty 1

## 2014-04-11 MED ORDER — ONDANSETRON HCL 4 MG/2ML IJ SOLN: 4.0000 mg | Freq: Four times a day (QID) | INTRAMUSCULAR | Status: DC | PRN

## 2014-04-11 MED ORDER — SODIUM CHLORIDE 0.9 % IJ SOLN
3.0000 mL | Freq: Two times a day (BID) | INTRAMUSCULAR | Status: DC
  Administered 2014-04-11: 23:00:00 via INTRAVENOUS
  Administered 2014-04-12 – 2014-04-13 (×3): 3 mL via INTRAVENOUS

## 2014-04-11 MED ORDER — ENOXAPARIN SODIUM 30 MG/0.3ML ~~LOC~~ SOLN
30.0000 mg | SUBCUTANEOUS | Status: DC
  Administered 2014-04-11 – 2014-04-13 (×3): 30 mg via SUBCUTANEOUS
  Filled 2014-04-11 (×4): qty 0.3

## 2014-04-11 MED ORDER — HYDROMORPHONE HCL 1 MG/ML IJ SOLN
0.5000 mg | INTRAMUSCULAR | Status: DC | PRN
  Administered 2014-04-12: 0.5 mg via INTRAVENOUS
  Filled 2014-04-11: qty 1

## 2014-04-11 MED ORDER — SODIUM CHLORIDE 0.9 % IJ SOLN: 3.0000 mL | INTRAMUSCULAR | Status: DC | PRN

## 2014-04-11 MED ORDER — SODIUM CHLORIDE 0.9 % IV SOLN: 250.0000 mL | INTRAVENOUS | Status: DC | PRN

## 2014-04-11 MED ORDER — ONDANSETRON HCL 4 MG PO TABS: 4.0000 mg | ORAL_TABLET | Freq: Four times a day (QID) | ORAL | Status: DC | PRN

## 2014-04-11 MED ORDER — OXYCODONE HCL 5 MG PO TABS
5.0000 mg | ORAL_TABLET | ORAL | Status: DC | PRN
  Administered 2014-04-12 – 2014-04-14 (×6): 5 mg via ORAL
  Filled 2014-04-11 (×6): qty 1

## 2014-04-11 MED ORDER — ACETAMINOPHEN 325 MG PO TABS
650.0000 mg | ORAL_TABLET | Freq: Four times a day (QID) | ORAL | Status: DC | PRN
  Administered 2014-04-12: 650 mg via ORAL
  Filled 2014-04-11: qty 2

## 2014-04-11 MED ORDER — OXYCODONE-ACETAMINOPHEN 5-325 MG PO TABS
1.0000 | ORAL_TABLET | Freq: Once | ORAL | Status: AC
  Administered 2014-04-11: 1 via ORAL
  Filled 2014-04-11: qty 1

## 2014-04-11 MED ORDER — ADULT MULTIVITAMIN W/MINERALS CH
1.0000 | ORAL_TABLET | Freq: Every morning | ORAL | Status: DC
  Administered 2014-04-12 – 2014-04-14 (×3): 1 via ORAL
  Filled 2014-04-11 (×3): qty 1

## 2014-04-11 MED ORDER — ACETAMINOPHEN 650 MG RE SUPP: 650.0000 mg | Freq: Four times a day (QID) | RECTAL | Status: DC | PRN

## 2014-04-11 MED ORDER — ALUM & MAG HYDROXIDE-SIMETH 200-200-20 MG/5ML PO SUSP: 30.0000 mL | Freq: Four times a day (QID) | ORAL | Status: DC | PRN

## 2014-04-11 MED ORDER — TIMOLOL MALEATE 0.5 % OP SOLN
1.0000 [drp] | Freq: Two times a day (BID) | OPHTHALMIC | Status: DC
  Administered 2014-04-11 – 2014-04-14 (×6): 1 [drp] via OPHTHALMIC
  Filled 2014-04-11: qty 5

## 2014-04-11 MED ORDER — GABAPENTIN 300 MG PO CAPS
300.0000 mg | ORAL_CAPSULE | Freq: Every evening | ORAL | Status: DC | PRN
  Filled 2014-04-11: qty 1

## 2014-04-11 NOTE — ED Provider Notes (Signed)
MSE was initiated and I personally evaluated the patient and placed orders (if any) at  2:39 PM on April 11, 2014.  The patient appears stable so that the remainder of the MSE may be completed by another provider.  Will order imaging of left knee and left hip. Does not want anything for pain at this time. Vitals normal., no fever. Pain with ambulation. Pt has tried hydrocodone, tramadol, ibuprofen, muscle relaxant  Jola Schmidt, MD 04/11/14 1440

## 2014-04-11 NOTE — ED Provider Notes (Signed)
CSN: [id number]     Arrival date & time 04/11/14  1344 History   First MD Initiated Contact with Patient 04/11/14 1435     Chief Complaint  Patient presents with  . Hip Pain    (Consider location/radiation/quality/duration/timing/severity/associated sxs/prior Treatment) HPI Comments: Patient is an 81 year old female with a history of thyroid disease and glaucoma as well as a LTKR in 2013 who presents to the emergency department for worsening left hip pain. Patient states that she had a fall on Christmas Eve and has had intermittent pain which has been gradually worsening over the past 3 months. She states that the pain was at its worst this morning. Pain was aggravated with ambulation. She did not take any medications for her pain because she has tried hydrocodone, tramadol, and ibuprofen for symptoms in the past without relief. She initially walked with a cane, but has been using a 4 wheel walker. She had difficulty ambulating with this today because of her hip pain. Pain will radiate down her left leg, mostly to her left knee. Patient denies any associated fever, extremity numbness/weakness, bowel/bladder incontinence, or new trauma/injury. Patient saw a PA of Dr. Nelva Bush at [facility] orthoped[facility] this past Wednesday who recommended a cortisone shot with MRI to follow if cortisone shot does not relieve patient's pain. She is waiting on her insurance to cover this procedure. She has been seen in the ED x 2 for similar complaints over the last 3 months.  Patient lives at home, alone. PCP - Dr. Billey Chang.  Patient is a 81 y.o. female presenting with hip pain. The history is provided by the patient. No language interpreter was used.  Hip Pain Associated symptoms include arthralgias. Pertinent negatives include no fever, numbness or weakness.    Past Medical History  Diagnosis Date  . Blood transfusion without reported diagnosis   . Thyroid disease   . GI bleed   . Glaucoma    Past Surgical History    Procedure Laterality Date  . Joint replacement  11/20/2011    LTKR  . Colonoscopy     No family history on file. History  Substance Use Topics  . Smoking status: Never Smoker   . Smokeless tobacco: Never Used  . Alcohol Use: No   OB History    No data available      Review of Systems  Constitutional: Negative for fever.  Genitourinary:       Negative for incontinence  Musculoskeletal: Positive for arthralgias. Negative for back pain.  Neurological: Negative for weakness and numbness.  All other systems reviewed and are negative.   Allergies  Aspirin; Penicillins; and Codeine  Home Medications   Prior to Admission medications   Medication Sig Start Date End Date Taking? Authorizing Provider  calcium-vitamin D (OSCAL WITH D) 500-200 MG-UNIT per tablet Take 1 tablet by mouth every morning.   Yes Historical Provider, MD  cyclobenzaprine (FLEXERIL) 5 MG tablet Take 5 mg by mouth 3 times/day as needed-between meals & bedtime for muscle spasms.   Yes Historical Provider, MD  gabapentin (NEURONTIN) 300 MG capsule Take 300 mg by mouth at bedtime as needed. pain 04/05/14  Yes Historical Provider, MD  HYDROcodone-acetaminophen (NORCO) 5-325 MG per tablet Take 1 tablet by mouth every 4 (four) hours as needed. 03/10/14  Yes Daleen Bo, MD  ibuprofen (ADVIL,MOTRIN) 200 MG tablet Take 200 mg by mouth every 6 (six) hours as needed for headache or moderate pain (pain).    Yes Historical Provider, MD  latanoprost (  XALATAN) 0.005 % ophthalmic solution Place 1 drop into both eyes at bedtime.   Yes Historical Provider, MD  levothyroxine (SYNTHROID, LEVOTHROID) 100 MCG tablet Take 100 mcg by mouth daily before breakfast.   Yes Historical Provider, MD  Multiple Vitamin (MULTIVITAMIN WITH MINERALS) TABS Take 1 tablet by mouth every morning.   Yes Historical Provider, MD  timolol (TIMOPTIC) 0.5 % ophthalmic solution Place 1 drop into both eyes 2 (two) times daily.   Yes Historical Provider, MD   traMADol (ULTRAM) 50 MG tablet Take 50 mg by mouth 3 (three) times daily as needed. pain 03/12/14  Yes Historical Provider, MD   BP 157/88 mmHg  Pulse 98  Temp(Src) 98.4 F (36.9 C) (Oral)  Resp 20  SpO2 95%   Physical Exam  Constitutional: She is oriented to person, place, and time. She appears well-developed and well-nourished. No distress.  Very well appearing and pleasant female.  HENT:  Head: Normocephalic and atraumatic.  Eyes: Conjunctivae and EOM are normal. No scleral icterus.  Neck: Normal range of motion.  Cardiovascular: Normal rate, regular rhythm and intact distal pulses.   DP and PT pulses 2+ b/l  Pulmonary/Chest: Effort normal. No respiratory distress.  Respirations even and unlabored  Musculoskeletal: Normal range of motion.       Left hip: She exhibits tenderness. She exhibits normal range of motion, no bony tenderness, no swelling, no deformity and no laceration.       Left knee: Normal. She exhibits no swelling, no effusion, no erythema, normal alignment, no LCL laxity, no bony tenderness and no MCL laxity. No tenderness found.       Lumbar back: Normal.       Left upper leg: She exhibits no tenderness, no bony tenderness, no swelling, no edema and no deformity.       Legs: Normal ROM of L hip with flexion, internal rotation, or external rotation. No swelling or crepitus. Pain with L knee flexion without bony TTP. Very mild L groin tenderness is reproducible on palpation.  Neurological: She is alert and oriented to person, place, and time.  Skin: Skin is warm and dry. No rash noted. She is not diaphoretic. No erythema. No pallor.  Psychiatric: She has a normal mood and affect. Her behavior is normal.  Nursing note and vitals reviewed.   ED Course  Procedures (including critical care time) Labs Review Labs Reviewed  CBC WITH DIFFERENTIAL/PLATELET  I-STAT CHEM 8, ED    Imaging Review Dg Knee Complete 4 Views Left  04/11/2014   CLINICAL DATA:  Left knee  pain following falls, initial encounter  EXAM: LEFT KNEE - COMPLETE 4+ VIEW  COMPARISON:  12/23/2011  FINDINGS: Left knee prosthesis is seen. No acute abnormality is noted. No joint effusion is seen. No fracture is noted.  IMPRESSION: Status post knee replacement.  No acute abnormality is noted.   Electronically Signed   By: Inez Catalina M.D.   On: 04/11/2014 15:31   Dg Hip Unilat With Pelvis 2-3 Views Left  04/11/2014   CLINICAL DATA:  Left hip pain the radiates down the left knee. Fall 3 months ago  EXAM: LEFT HIP (WITH PELVIS) 2-3 VIEWS  COMPARISON:  Plain film 01/23/2014  FINDINGS: There is a cortical disruption along the left superior pubic ramus consistent with fracture. Fracture appears to extend into the acetabulum.There is a double density shadow in the inferior pubic ramus on the left consists with fracture. No evidence of left femoral neck fracture.  IMPRESSION: Minimally displaced  fractures of the left superior and inferior pubic rami.   Electronically Signed   By: Suzy Bouchard M.D.   On: 04/11/2014 15:31     EKG Interpretation None      MDM   Final diagnoses:  Pain  Fracture of multiple pubic rami, left, closed, initial encounter    81 year old female presents to the emergency department for further evaluation of persistent left hip pain following a fall at Christmas Eve. Patient is neurovascularly intact. X-ray today shows a minimally displaced fracture of the left superior and inferior pubic rami, not previously seen on x-ray in January 2016. Patient has attempted ambulation in the ED, overall, she does fairly well after treatment with Percocet; however, she states that she does not feel comfortable with discharge as she has tried a cane and 2 different kinds of walkers with pain medication at home but is still having trouble with her ADLs. She lives alone in a 2 story townhouse.   Care management and social work unavailable at this time of the night on the weekend. I believe  the patient would probably manage well with home health nursing or in a short term rehab facility. I have discussed the case with Dr. Arnoldo Morale of [facility] who will admit the patient for further pain control and consultation with care management in the morning.   Filed Vitals:   04/11/14 1344 04/11/14 1633  BP: 157/88 138/80  Pulse: 98 100  Temp: 98.4 F (36.9 C) 97.7 F (36.5 C)  TempSrc: Oral Oral  Resp: 20 18  SpO2: 95% 96%     Antonietta Breach, PA-C 04/11/14 2011  Jola Schmidt, MD 04/12/14 [phone]

## 2014-04-11 NOTE — ED Notes (Signed)
Pt ambulated with no assist. Pt. Stated that she had little pain while ambulating in the hall way. Nurse was notified.

## 2014-04-11 NOTE — ED Notes (Signed)
Bed: XI71 Expected date: 04/11/14 Expected time: 1:40 PM Means of arrival: Ambulance Comments: Hip pain unable to walk

## 2014-04-11 NOTE — H&P (Signed)
[facility] Admission History and Physical       Stephanie Donovan NUU:[id number] DOB: 1927/12/31 DOA: 04/11/2014  Referring physician: EDP PCP: [name], MD  Specialists:   Chief Complaint:  Left Hip and Knee Pain  HPI: Stephanie Donovan is a 81 y.o. female with a history of Hypothyroidism and Glaucoma who presents to the ED with complatnis of unremitting pain of the Left hip and knee since a fall on Christmas Eve.   She had X-rays and evaluations since with negative results and no fracture being seen.   She had been prescribed medications for there pain, and even physical therapy which offered temporary relief.   She began to have increased pain today and could not bear weight on her left leg due to increased pain in her left hip and left knee. She was evaluated in the ED and X-rays of the Pelvic and Left knee revealed fractures of the Left Superior and Inferior Pubic Rami and was referred for admission.     Review of Systems:  Constitutional: No Weight Loss, No Weight Gain, Night Sweats, Fevers, Chills, Dizziness, Light Headedness, Fatigue, or Generalized Weakness HEENT: No Headaches, Difficulty Swallowing,Tooth/Dental Problems,Sore Throat,  No Sneezing, Rhinitis, Ear Ache, Nasal Congestion, or Post Nasal Drip,  Cardio-vascular:  No Chest pain, Orthopnea, PND, Edema in Lower Extremities, Anasarca, Dizziness, Palpitations  Resp: No Dyspnea, No DOE, No Productive Cough, No Non-Productive Cough, No Hemoptysis, No Wheezing.    GI: No Heartburn, Indigestion, Abdominal Pain, Nausea, Vomiting, Diarrhea, Constipation, Hematemesis, Hematochezia, Melena, Change in Bowel Habits,  Loss of Appetite  GU: No Dysuria, No Change in Color of Urine, No Urgency or Urinary Frequency, No Flank pain.  Musculoskeletal: No Joint Pain or Swelling, + Decreased Range of Motion of Left Hip, No Back Pain.  Neurologic: No Syncope, No Seizures, Muscle Weakness, Paresthesia, Vision Disturbance or Loss,  No Diplopia, No Vertigo, No Difficulty Walking,  Skin: No Rash or Lesions. Psych: No Change in Mood or Affect, No Depression or Anxiety, No Memory loss, No Confusion, or Hallucinations   Past Medical History  Diagnosis Date  . Blood transfusion without reported diagnosis   . Thyroid disease   . GI bleed   . Glaucoma      Past Surgical History  Procedure Laterality Date  . Joint replacement  11/20/2011    LTKR  . Colonoscopy        Prior to Admission medications   Medication Sig Start Date End Date Taking? Authorizing Provider  calcium-vitamin D (OSCAL WITH D) 500-200 MG-UNIT per tablet Take 1 tablet by mouth every morning.   Yes Historical Provider, MD  cyclobenzaprine (FLEXERIL) 5 MG tablet Take 5 mg by mouth 3 times/day as needed-between meals & bedtime for muscle spasms.   Yes Historical Provider, MD  gabapentin (NEURONTIN) 300 MG capsule Take 300 mg by mouth at bedtime as needed. pain 04/05/14  Yes Historical Provider, MD  HYDROcodone-acetaminophen (NORCO) 5-325 MG per tablet Take 1 tablet by mouth every 4 (four) hours as needed. 03/10/14  Yes Daleen Bo, MD  ibuprofen (ADVIL,MOTRIN) 200 MG tablet Take 200 mg by mouth every 6 (six) hours as needed for headache or moderate pain (pain).    Yes Historical Provider, MD  latanoprost (XALATAN) 0.005 % ophthalmic solution Place 1 drop into both eyes at bedtime.   Yes Historical Provider, MD  levothyroxine (SYNTHROID, LEVOTHROID) 100 MCG tablet Take 100 mcg by mouth daily before breakfast.   Yes Historical Provider, MD  Multiple Vitamin (MULTIVITAMIN  WITH MINERALS) TABS Take 1 tablet by mouth every morning.   Yes Historical Provider, MD  timolol (TIMOPTIC) 0.5 % ophthalmic solution Place 1 drop into both eyes 2 (two) times daily.   Yes Historical Provider, MD  traMADol (ULTRAM) 50 MG tablet Take 50 mg by mouth 3 (three) times daily as needed. pain 03/12/14  Yes Historical Provider, MD     Allergies  Allergen Reactions  . Aspirin       Mixed reactions  . Penicillins     Lost hearing temporarily  . Codeine Palpitations    Social History:  reports that she has never smoked. She has never used smokeless tobacco. She reports that she does not drink alcohol or use illicit drugs.    No family history on file.     Physical Exam:  GEN:  Pleasant Younger than Stated Age Appearing  81 y.o.Caucasian  female examined and in no acute distress; cooperative with exam Filed Vitals:   04/11/14 1344 04/11/14 1633  BP: 157/88 138/80  Pulse: 98 100  Temp: 98.4 F (36.9 C) 97.7 F (36.5 C)  TempSrc: Oral Oral  Resp: 20 18  SpO2: 95% 96%   Blood pressure 138/80, pulse 100, temperature 97.7 F (36.5 C), temperature source Oral, resp. rate 18, SpO2 96 %. PSYCH: She is alert and oriented x4; does not appear anxious does not appear depressed; affect is normal HEENT: Normocephalic and Atraumatic, Mucous membranes pink; PERRLA; EOM intact; Fundi:  Benign;  No scleral icterus, Nares: Patent, Oropharynx: Clear, Fair Dentition,    Neck:  FROM, No Cervical Lymphadenopathy nor Thyromegaly or Carotid Bruit; No JVD; Breasts:: Not examined CHEST WALL: No tenderness CHEST: Normal respiration, clear to auscultation bilaterally HEART: Regular rate and rhythm; no murmurs rubs or gallops BACK: No kyphosis or scoliosis; No CVA tenderness ABDOMEN: Positive Bowel Sounds, Soft Non-Tender, No Rebound or Guarding; No Masses, No Organomegaly. Rectal Exam: Not done EXTREMITIES: No Cyanosis, Clubbing, or Edema; No Ulcerations. Genitalia: not examined PULSES: 2+ and symmetric SKIN: Normal hydration no rash or ulceration CNS:  Alert and Oriented x 4, No Focal Deficits Vascular: pulses palpable throughout    Labs on Admission:  Basic Metabolic Panel:  Recent Labs Lab 04/11/14 1933  NA 138  K 3.9  CL 100  GLUCOSE 96  BUN 13  CREATININE 0.60   Liver Function Tests: No results for input(s): AST, ALT, ALKPHOS, BILITOT, PROT, ALBUMIN in the  last 168 hours. No results for input(s): LIPASE, AMYLASE in the last 168 hours. No results for input(s): AMMONIA in the last 168 hours. CBC:  Recent Labs Lab 04/11/14 1927 04/11/14 1933  WBC 6.5  --   NEUTROABS 3.7  --   HGB 12.4 13.3  HCT 37.6 39.0  MCV 85.3  --   PLT 222  --    Cardiac Enzymes: No results for input(s): CKTOTAL, CKMB, CKMBINDEX, TROPONINI in the last 168 hours.  BNP (last 3 results) No results for input(s): BNP in the last 8760 hours.  ProBNP (last 3 results) No results for input(s): PROBNP in the last 8760 hours.  CBG: No results for input(s): GLUCAP in the last 168 hours.  Radiological Exams on Admission: Dg Knee Complete 4 Views Left  04/11/2014   CLINICAL DATA:  Left knee pain following falls, initial encounter  EXAM: LEFT KNEE - COMPLETE 4+ VIEW  COMPARISON:  12/23/2011  FINDINGS: Left knee prosthesis is seen. No acute abnormality is noted. No joint effusion is seen. No fracture is noted.  IMPRESSION: Status post knee replacement.  No acute abnormality is noted.   Electronically Signed   By: Inez Catalina M.D.   On: 04/11/2014 15:31   Dg Hip Unilat With Pelvis 2-3 Views Left  04/11/2014   CLINICAL DATA:  Left hip pain the radiates down the left knee. Fall 3 months ago  EXAM: LEFT HIP (WITH PELVIS) 2-3 VIEWS  COMPARISON:  Plain film 01/23/2014  FINDINGS: There is a cortical disruption along the left superior pubic ramus consistent with fracture. Fracture appears to extend into the acetabulum.There is a double density shadow in the inferior pubic ramus on the left consists with fracture. No evidence of left femoral neck fracture.  IMPRESSION: Minimally displaced fractures of the left superior and inferior pubic rami.   Electronically Signed   By: Suzy Bouchard M.D.   On: 04/11/2014 15:31     EKG: Independently reviewed.    Assessment/Plan:   81 y.o. female with  Principal Problem:   1.    Fracture of multiple pubic rami/Pubic bone fracture   Pain  control   Physical Therapy Evaluation   Case Management consult for Rehab Placement Options   Active Problems:   2.   Difficulty walking -due  to #1     3.   Left hip pain - due to #1     4.    Hypothyroidism   Check TSH       5.    DVT Prophylaxis   Lovenox   Code Status:     FULL CODE        Family Communication:    No Family Present    Disposition Plan:    Inpatient Status        Time spent:  [facility] Pager [phone]   If [place] Please Contact the Day Rounding Team MD for [facility]  If 7PM-7AM, Please Contact Night-Floor Coverage  [email or web address] Password [email or web address] 04/11/2014, 8:15 PM     ADDENDUM:   Patient was seen and examined on 04/11/2014

## 2014-04-11 NOTE — ED Notes (Signed)
She reports non-traumatic left hip pain.  She cites having fallen Christmas Eve of 2015, at which time she was seen here.  She reports "worse pain in my left hip than I've ever had.  She cites no new or recent trauma.  She arrives in no distress.

## 2014-04-12 ENCOUNTER — Encounter (HOSPITAL_COMMUNITY): Payer: Self-pay | Admitting: Radiology

## 2014-04-12 ENCOUNTER — Inpatient Hospital Stay (HOSPITAL_COMMUNITY): Payer: Medicare Other

## 2014-04-12 LAB — CBC
HCT: 37.4 % (ref 36.0–46.0)
Hemoglobin: 12.2 g/dL (ref 12.0–15.0)
MCH: 28.1 pg (ref 26.0–34.0)
MCHC: 32.6 g/dL (ref 30.0–36.0)
MCV: 86.2 fL (ref 78.0–100.0)
Platelets: 199 10*3/uL (ref 150–400)
RBC: 4.34 MIL/uL (ref 3.87–5.11)
RDW: 13.8 % (ref 11.5–15.5)
WBC: 5.3 10*3/uL (ref 4.0–10.5)

## 2014-04-12 LAB — BASIC METABOLIC PANEL
ANION GAP: 8 (ref 5–15)
BUN: 14 mg/dL (ref 6–23)
CALCIUM: 8.9 mg/dL (ref 8.4–10.5)
CO2: 30 mmol/L (ref 19–32)
CREATININE: 0.71 mg/dL (ref 0.50–1.10)
Chloride: 100 mmol/L (ref 96–112)
GFR calc Af Amer: 89 mL/min — ABNORMAL LOW (ref >90)
GFR calc non Af Amer: 77 mL/min — ABNORMAL LOW (ref >90)
Glucose, Bld: 94 mg/dL (ref 70–99)
Potassium: 3.7 mmol/L (ref 3.5–5.1)
Sodium: 138 mmol/L (ref 135–145)

## 2014-04-12 MED ORDER — METHOCARBAMOL 500 MG PO TABS: 500.0000 mg | ORAL_TABLET | Freq: Three times a day (TID) | ORAL | Status: DC | PRN

## 2014-04-12 NOTE — Progress Notes (Addendum)
[facility] PROGRESS NOTE  Stephanie Donovan:[id number] DOB: 30-Aug-1927 DOA: 04/11/2014 PCP: [name], MD  Assessment/Plan: Fracture of superior and inferior left pubic ramus Fractures were not noticed on prior imaging. Patient reports off and on pain since she had a fall 3 months back. X-ray of the left hip shows superior and inferior left pubic ramus fracture. CT of the left hip done given  concern for left acetabular involvement on x-ray shows mildly displaced left obturator ring  fractures with sclerosis of the left parasymphyseal pubis and left sacral ala suggestive of acute on chronic injury. Patient reports seeing [facility] orthopedics recently as outpatient and will ask for their evaluation. -Continue pain control with when necessary oxycodone and Dilaudid as needed. Continue Flexeril for muscle spasms. Seen by PT and recommend skilled nursing facility. [facility] orthopedics consulted and will see patient in the morning.  History of glaucoma Resume home medications  Hypothyroidism Continue Synthroid  DVT prophylaxis: Subcutaneous Lovenox  Code Status: Full code Family Communication: None at bedside Disposition Plan: SNF per PT. Will reevaluate   Consultants:  [facility] orthopedics consulted  Procedures: CT of the left hip    Antibiotics:  None  HPI/Subjective: Patient seen and examined. Continues to have left hip pain radiating down to the upper thigh laterally  Objective: Filed Vitals:   04/12/14 1433  BP: 143/74  Pulse: 88  Temp: 97.9 F (36.6 C)  Resp: 18    Intake/Output Summary (Last 24 hours) at 04/12/14 1843 Last data filed at 04/12/14 1757  Gross per 24 hour  Intake    486 ml  Output      0 ml  Net    486 ml   Filed Weights   04/11/14 2010  Weight: 65.3 kg (143 lb 15.4 oz)    Exam:   General:  Elderly female in no acute distress  HEENT: No pallor, moist oral mucosa  Chest: Clear bilaterally, no added  sounds  Cardiovascular: Normal S1 and S2, no murmurs rub or gallop  GI: Soft, nondistended, nontender  Musculoskeletal: Warm, tender to pressure over left hip with limited ROM    Data Reviewed: Basic Metabolic Panel:  Recent Labs Lab 04/11/14 1933 04/12/14 0500  NA 138 138  K 3.9 3.7  CL 100 100  CO2  --  30  GLUCOSE 96 94  BUN 13 14  CREATININE 0.60 0.71  CALCIUM  --  8.9   Liver Function Tests: No results for input(s): AST, ALT, ALKPHOS, BILITOT, PROT, ALBUMIN in the last 168 hours. No results for input(s): LIPASE, AMYLASE in the last 168 hours. No results for input(s): AMMONIA in the last 168 hours. CBC:  Recent Labs Lab 04/11/14 1927 04/11/14 1933 04/12/14 0500  WBC 6.5  --  5.3  NEUTROABS 3.7  --   --   HGB 12.4 13.3 12.2  HCT 37.6 39.0 37.4  MCV 85.3  --  86.2  PLT 222  --  199   Cardiac Enzymes: No results for input(s): CKTOTAL, CKMB, CKMBINDEX, TROPONINI in the last 168 hours. BNP (last 3 results) No results for input(s): BNP in the last 8760 hours.  ProBNP (last 3 results) No results for input(s): PROBNP in the last 8760 hours.  CBG: No results for input(s): GLUCAP in the last 168 hours.  No results found for this or any previous visit (from the past 240 hour(s)).   Studies: Ct Hip Left Wo Contrast  04/12/2014   CLINICAL DATA:  LEFT hip pain. Fall on Christmas  Eve. Pelvic fractures on prior radiographs. Initial encounter.  EXAM: CT OF THE LEFT HIP WITHOUT CONTRAST  TECHNIQUE: Multidetector CT imaging of the left hip was performed according to the standard protocol. Multiplanar CT image reconstructions were also generated.  COMPARISON:  04/11/2014.  FINDINGS: Mildly displaced LEFT obturator ring fractures are present. The root of the LEFT superior pubic ramus fracture is minimally displaced. There is no acetabular extension. Mild displacement of approximately 1 shaft width of the inferior pubic ramus is present. Both of these fractures appear acute  or subacute, with crisp margins. No obturator hematoma. Mild edema is present around the obturator ring consistent with acute or subacute fracture.  Additionally, there is a LEFT sacral ala fracture. Sclerosis extends transversely across the S2 vertebra. The sclerosis suggests subacute to chronic fracture. Sclerosis is also present in the LEFT parasymphyseal pubis, suggesting healing fracture.  The LEFT femoral neck is intact. The visceral pelvis appears within normal limits.  Compared to CT 03/26/2013, there is no sclerosis in the sacrum or parasymphyseal pubis.  IMPRESSION: LEFT obturator ring fractures, with sclerosis of the LEFT parasymphyseal pubis and LEFT sacral ala that suggests acute on chronic injury. The root of the LEFT superior pubic ramus and LEFT inferior pubic ramus fractures appear subacute or acute, without sclerosis or ossified callus whereas the pubic symphysis fracture is probably chronic and the LEFT sacral ala fracture is probably acute on chronic.   Electronically Signed   By: Dereck Ligas M.D.   On: 04/12/2014 11:56   Dg Knee Complete 4 Views Left  04/11/2014   CLINICAL DATA:  Left knee pain following falls, initial encounter  EXAM: LEFT KNEE - COMPLETE 4+ VIEW  COMPARISON:  12/23/2011  FINDINGS: Left knee prosthesis is seen. No acute abnormality is noted. No joint effusion is seen. No fracture is noted.  IMPRESSION: Status post knee replacement.  No acute abnormality is noted.   Electronically Signed   By: Inez Catalina M.D.   On: 04/11/2014 15:31   Dg Hip Unilat With Pelvis 2-3 Views Left  04/11/2014   CLINICAL DATA:  Left hip pain the radiates down the left knee. Fall 3 months ago  EXAM: LEFT HIP (WITH PELVIS) 2-3 VIEWS  COMPARISON:  Plain film 01/23/2014  FINDINGS: There is a cortical disruption along the left superior pubic ramus consistent with fracture. Fracture appears to extend into the acetabulum.There is a double density shadow in the inferior pubic ramus on the left  consists with fracture. No evidence of left femoral neck fracture.  IMPRESSION: Minimally displaced fractures of the left superior and inferior pubic rami.   Electronically Signed   By: Suzy Bouchard M.D.   On: 04/11/2014 15:31    Scheduled Meds: . calcium-vitamin D  1 tablet Oral q morning - 10a  . enoxaparin (LOVENOX) injection  30 mg Subcutaneous Q24H  . latanoprost  1 drop Both Eyes QHS  . levothyroxine  100 mcg Oral QAC breakfast  . multivitamin with minerals  1 tablet Oral q morning - 10a  . sodium chloride  3 mL Intravenous Q12H  . timolol  1 drop Both Eyes BID   Continuous Infusions:      Time spent: [place], [place]  [facility] Pager [phone]. If 7PM-7AM, please contact night-coverage at [email or web address], password [facility] 04/12/2014, 6:43 PM  LOS: 1 day

## 2014-04-12 NOTE — Progress Notes (Signed)
INITIAL NUTRITION ASSESSMENT  DOCUMENTATION CODES Per approved criteria  -Not Applicable   INTERVENTION: - Encourage adequate PO intake.  - RD will continue to monitor  NUTRITION DIAGNOSIS: Inadequate oral intake related to poor appetite as evidenced by wt loss.   Goal: Pt to meet >/= 90% of their estimated nutrition needs   Monitor:  Weight trend, po intake, labs  Reason for Assessment: Malnutrition Screening Tool  81 y.o. female  Admitting Dx: Fracture of multiple pubic rami  ASSESSMENT: 81 y.o. female with a history of Hypothyroidism and Glaucoma who presents to the ED with complatnis of unremitting pain of the Left hip and knee since a fall on Christmas Eve.  - Pt with fractures of left superior and inferior pubic rami.  - Reports that her appetite has been poor recently. Improving since hospital admission. Ate 100% of breakfast this am, skipped lunch due to down for testing, and plans on ordering dinner.  - 12 lb wt loss in the past month.  - Pt denied the need for supplements at this time and feels that she will be able to eat fine on her own.  - Labs reviewed - No signs of fat or muscle depletion  Height: Ht Readings from Last 1 Encounters:  04/11/14 5\' 2"  (1.575 m)    Weight: Wt Readings from Last 1 Encounters:  04/11/14 143 lb 15.4 oz (65.3 kg)    Ideal Body Weight: 50.1 kg  % Ideal Body Weight: 130%  Wt Readings from Last 10 Encounters:  04/11/14 143 lb 15.4 oz (65.3 kg)  03/28/13 155 lb (70.308 kg)  03/25/13 151 lb 7.3 oz (68.7 kg)  10/02/12 155 lb (70.308 kg)    Usual Body Weight: 155 lbs  % Usual Body Weight: 92%  BMI:  Body mass index is 26.32 kg/(m^2).  Estimated Nutritional Needs: Kcal: 1600-1800 Protein: 85-100 g Fluid: 1.7 L/day  Skin: intact  Diet Order: Diet regular  EDUCATION NEEDS: -Education needs addressed   Intake/Output Summary (Last 24 hours) at 04/12/14 1515 Last data filed at 04/12/14 1436  Gross per 24 hour   Intake    246 ml  Output      0 ml  Net    246 ml    Last BM: prior to admission   Labs:   Recent Labs Lab 04/11/14 1933 04/12/14 0500  NA 138 138  K 3.9 3.7  CL 100 100  CO2  --  30  BUN 13 14  CREATININE 0.60 0.71  CALCIUM  --  8.9  GLUCOSE 96 94    CBG (last 3)  No results for input(s): GLUCAP in the last 72 hours.  Scheduled Meds: . calcium-vitamin D  1 tablet Oral q morning - 10a  . enoxaparin (LOVENOX) injection  30 mg Subcutaneous Q24H  . latanoprost  1 drop Both Eyes QHS  . levothyroxine  100 mcg Oral QAC breakfast  . multivitamin with minerals  1 tablet Oral q morning - 10a  . sodium chloride  3 mL Intravenous Q12H  . timolol  1 drop Both Eyes BID    Continuous Infusions:   Past Medical History  Diagnosis Date  . Blood transfusion without reported diagnosis   . Thyroid disease   . GI bleed   . Glaucoma     Past Surgical History  Procedure Laterality Date  . Joint replacement  11/20/2011    LTKR  . Colonoscopy      Laurette Schimke Melvin, [place], [place]

## 2014-04-12 NOTE — Care Management Note (Signed)
    Page 1 of 2   04/14/2014     1:15:49 PM CARE MANAGEMENT NOTE 04/14/2014  Patient:  Stephanie Donovan, Stephanie Donovan   Account Number:  [id number]  Date Initiated:  04/12/2014  Documentation initiated by:  DAVIS,RHONDA  Subjective/Objective Assessment:   fell around christmas of 2015 pain in hip since became unbearable [id number] to have ramius fracture with displacement, admittd for pain controll     Action/Plan:   tbd based on pt eval   Anticipated DC Date:  04/15/2014   Anticipated DC Plan:  SKILLED NURSING FACILITY  In-house referral  NA      DC Planning Services  CM consult      PAC Choice  NA   Choice offered to / List presented to:  NA           Status of service:  Completed, signed off Medicare Important Message given?  YES (If response is "NO", the following Medicare IM given date fields will be blank) Date Medicare IM given:  04/14/2014 Medicare IM given by:  [facility] Date Additional Medicare IM given:   Additional Medicare IM given by:    Discharge Disposition:  [place]  Per UR Regulation:  Reviewed for med. necessity/level of care/duration of stay  If discussed at [place] of Stay Meetings, dates discussed:    Comments:  04/14/14 13:13 CM notes pt to go to SNF; CSW arranging.  No other CM needs were communicated.  Mariane Masters, BSN, [place] [phone].  04/13/14 12:25 Cm met with pt to discuss disposition. Waiting for MRI results.  Pt states she has Medicare A&B and tricare supplemental but cannot afford skilled rehab if this is her only recourse; unfortunately, without intervention, she also states she cannot return home as she lives alone. CSW aware. We will follow for pt progress. Mariane Masters, BSn, Cm [phone].  April 12, 2014/Rhonda L. Rosana Hoes, RN, BSN, CCM. Case Management [place] [phone] No discharge needs present of time of review.

## 2014-04-12 NOTE — Evaluation (Signed)
Physical Therapy Evaluation Patient Details Name: Stephanie Donovan MRN: [id number] DOB: 1927-05-24 Today's Date: 04/12/2014   History of Present Illness  presents to the ED 04/11/14 with complatnis of unremitting pain of the Left hip and knee since a fall on Christmas Eve. has been functioning until  increase in pain 3/20.  xrays reveal obturator ring fracture, L superior pubic rami fracture, L sacral ala fracture. per patient was seen by MD at  [facility] last week  and is to be scheduled for a "shot" soon.   Clinical Impression  Patient indicates pain along L thigh , shooting and deep, especially with attempts to adduct L thigh or cross legs. Limited  By c/o pain during ambulation, even with use of RW. Patient will benefit from PT to address problems listed in note below.Patient's bed/bath on second level, lives alone, no family available.    Follow Up Recommendations SNF;Supervision/Assistance - 24 hour    Equipment Recommendations  None recommended by PT    Recommendations for Other Services OT consult     Precautions / Restrictions Precautions Precautions: Fall      Mobility  Bed Mobility Overal bed mobility: Needs Assistance Bed Mobility: Supine to Sit;Sit to Supine     Supine to sit: Modified independent (Device/Increase time) Sit to supine: Min assist   General bed mobility comments: to assist L leg onto bed, c/o increase in pain with attempts to lift leg, noted to grab her thigh frequently.  Transfers Overall transfer level: Needs assistance Equipment used: Rolling walker (2 wheeled) Transfers: Sit to/from [facility] Sit to Stand: Min assist;Mod assist Stand pivot transfers: Min assist       General transfer comment: at times requires steady assist when a sharp pain grabs pt and she buckles somewhat. pivots with Rw to recliner.   Ambulation/Gait Ambulation/Gait assistance: Min assist;Mod assist Ambulation Distance (Feet): 20 Feet  (x2) Assistive device: Rolling walker (2 wheeled) Gait Pattern/deviations: Step-to pattern;Antalgic;Decreased step length - left;Decreased stance time - left Gait velocity: slow, halto=ing when experiences pain   General Gait Details: patient  demonstrates unsteady gait  as noted by L leg buckling when patient reports the pain shoots down leg,  attempted to walk into hallway but pain scalating. just took pain meds.  Stairs            Wheelchair Mobility    Modified Rankin (Stroke Patients Only)       Balance Overall balance assessment: Needs assistance         Standing balance support: During functional activity;No upper extremity supported Standing balance-Leahy Scale: Poor Standing balance comment: due toLLE decreased support                             Pertinent Vitals/Pain Pain Assessment: 0-10 Pain Score: 10-Worst pain ever Pain Location: intermittent pain  at rest  <3, moving escalates pain.Patient reports pain is L lateral thigh that shoots from hip to knee.Patient reports she has had sciatica before. Pain Descriptors / Indicators: Shooting;Cramping;Stabbing Pain Intervention(s): Monitored during session;Limited activity within patient's tolerance;RN gave pain meds during session;Ice applied    Home Living Family/patient expects to be discharged to:: Private residence Living Arrangements: Alone Available Help at Discharge: Family;Available PRN/intermittently Type of Home: House Home Access: Level entry     Home Layout: Two level Home Equipment: Walker - 2 wheels;Walker - 4 wheels      Prior Function Level of Independence: Independent  Hand Dominance        Extremity/Trunk Assessment   Upper Extremity Assessment: Overall WFL for tasks assessed           Lower Extremity Assessment: LLE deficits/detail LLE Deficits / Details: limited  adduction with increased pain down lateral thigh, has to "Pick up" thigh to flex  hip into bed and to attempt to cross  L over R leg  Cervical / Trunk Assessment: Kyphotic  Communication   Communication: No difficulties  Cognition Arousal/Alertness: Awake/alert Behavior During Therapy: WFL for tasks assessed/performed;Anxious Overall Cognitive Status: Within Functional Limits for tasks assessed                      General Comments      Exercises        Assessment/Plan    PT Assessment    PT Diagnosis Difficulty walking;Acute pain   PT Problem List    PT Treatment Interventions     PT Goals (Current goals can be found in the Care Plan section) Acute Rehab PT Goals Patient Stated Goal: to be active again , be independent PT Goal Formulation: With patient Time For Goal Achievement: 04/26/14 Potential to Achieve Goals: Good    Frequency     Barriers to discharge        Co-evaluation               End of Session   Activity Tolerance: Patient limited by pain Patient left: in chair;with call bell/phone within reach Nurse Communication: Mobility status;Patient requests pain meds         Time: 9470-7615 PT Time Calculation (min) (ACUTE ONLY): 28 min   Charges:   PT Evaluation $Initial PT Evaluation Tier I: 1 Procedure PT Treatments $Gait Training: 8-22 mins   PT G Codes:        Claretha Cooper 04/12/2014, 5:18 PM Tresa Endo PT [phone]

## 2014-04-13 ENCOUNTER — Inpatient Hospital Stay (HOSPITAL_COMMUNITY): Payer: Medicare Other

## 2014-04-13 MED ORDER — POLYETHYLENE GLYCOL 3350 17 G PO PACK
17.0000 g | PACK | Freq: Two times a day (BID) | ORAL | Status: DC
  Administered 2014-04-13 – 2014-04-14 (×3): 17 g via ORAL

## 2014-04-13 MED ORDER — LIP MEDEX EX OINT
TOPICAL_OINTMENT | CUTANEOUS | Status: AC
  Filled 2014-04-13: qty 7

## 2014-04-13 MED ORDER — HYDROMORPHONE HCL 1 MG/ML IJ SOLN: 1.0000 mg | INTRAMUSCULAR | Status: DC | PRN

## 2014-04-13 NOTE — Progress Notes (Signed)
PT NOTE. Noted to have orthopedic C/S today. Patient continues to c/o severe pain when getting up. wuill await consult and recs. Suggest that patient use [facility] until consulted. Tresa Endo [phone]

## 2014-04-13 NOTE — Progress Notes (Signed)
Physical Therapy Treatment Patient Details Name: Stephanie Donovan MRN: [id number] DOB: January 30, 1927 Today's Date: 04/13/2014    History of Present Illness presents to the ED 04/11/14 with complatnis of unremitting pain of the Left hip and knee since a fall on Christmas Eve. has been functioning until  increase in pain 3/20.  xrays reveal obturator ring fracture, L superior pubic rami fracture, L sacral ala fracture. per patient was seen by MD at  [facility] last week  and is to be scheduled for a "shot" soon.     PT Comments    Patient is now TDWB on  The LLE after ortho consult. MRI results pending. Patient clearly unable to care for self  With limited WB and lives alone, . C/o increased pain with attempts to ambulate. Has been using [facility] . Discussed home options if pt unable to go to rehab. Patient will be required to stay on  First level,  No bedrooms, half bath. Patient would need to mobilize from a [facility] as ambulation is too painful. Patient's daughter unable to assist except for meals.  Follow Up Recommendations  SNF;Supervision/Assistance - 24 hour     Equipment Recommendations  None recommended by PT    Recommendations for Other Services OT consult     Precautions / Restrictions Precautions Precautions: Fall Restrictions Weight Bearing Restrictions: Yes LLE Weight Bearing: Touchdown weight bearing    Mobility  Bed Mobility Overal bed mobility: Needs Assistance Bed Mobility: Supine to Sit;Sit to Supine     Supine to sit: Modified independent (Device/Increase time) Sit to supine: Min assist   General bed mobility comments: to assist L leg onto bed, c/o increase in pain with attempts to lift leg, noted to grab her thigh frequently.  Transfers Overall transfer level: Needs assistance Equipment used: Rolling walker (2 wheeled) Transfers: Sit to/from Stand Sit to Stand: Min assist            Ambulation/Gait Ambulation/Gait assistance: Mod  assist Ambulation Distance (Feet): 10 Feet Assistive device: Rolling walker (2 wheeled) Gait Pattern/deviations: Step-to pattern Gait velocity: slow, halting steps  when experiences pain   General Gait Details: patient  has difficulty with toe touch weight bearing today after new order. , Hopping jars the leg.     Stairs            Wheelchair Mobility    Modified Rankin (Stroke Patients Only)       Balance           Standing balance support: During functional activity Standing balance-Leahy Scale: Poor Standing balance comment: while  maintain  TDWB                    Cognition Arousal/Alertness: Awake/alert                          Exercises      General Comments        Pertinent Vitals/Pain Pain Score: 10-Worst pain ever Pain Location: escalates with attempts  to ambulate , difficult to Toe touch, jars the leg to hop Pain Descriptors / Indicators: Burning;Grimacing;Discomfort;Stabbing Pain Intervention(s): Limited activity within patient's tolerance;Patient requesting pain meds-RN notified;Repositioned    Home Living                      Prior Function            PT Goals (current goals can now be found in the care plan  section) Progress towards PT goals:  (not able now with limited WBS on LLE)    Frequency       PT Plan Current plan remains appropriate    Co-evaluation             End of Session Equipment Utilized During Treatment: Gait belt Activity Tolerance: Patient limited by pain Patient left: in bed;with call bell/phone within reach;with family/visitor present     Time: 1415-1440 PT Time Calculation (min) (ACUTE ONLY): 25 min  Charges:  $Gait Training: 8-22 mins $Self Care/Home Management: 8-22                    G Codes:      Claretha Cooper 04/13/2014, 4:24 PM Tresa Endo PT [phone]

## 2014-04-13 NOTE — Progress Notes (Addendum)
[facility] PROGRESS NOTE  Assessment/Plan: Inferior and Superios Left femur fracture : - CT of the left hip done that showed a possible concern of left acetabular involvement with mild displaced left obturator ring fracture. With acute versus subacute root of the LEFT superior pubic ramus and LEFT inferior pubic ramus fractures. - Orthopedics was consulted and recommendation. - increase pain medications.  History of glaucoma: No changes were made to his medication.  Hypothyroidism: Continue Synthroid.  Code Status: Full code Family Communication: None at bedside Disposition Plan: SNF per PT. Will reevaluate   Consultants:  ortho  Procedures:  CT pelvis  Antibiotics:  None  HPI/Subjective: No complains  Objective: Filed Vitals:   04/12/14 0526 04/12/14 1433 04/12/14 2103 04/13/14 0559  BP: 142/65 143/74 134/70 126/73  Pulse: 85 88 81 95  Temp: 97.4 F (36.3 C) 97.9 F (36.6 C) 97.5 F (36.4 C) 97.6 F (36.4 C)  TempSrc: Oral Oral Oral Oral  Resp: 16 18 18 18   Height:      Weight:      SpO2: 97% 98% 99% 96%    Intake/Output Summary (Last 24 hours) at 04/13/14 1019 Last data filed at 04/13/14 0931  Gross per 24 hour  Intake    480 ml  Output      0 ml  Net    480 ml   Filed Weights   04/11/14 2010  Weight: 65.3 kg (143 lb 15.4 oz)    Exam:  General: Alert, awake, oriented x3, in no acute distress.  HEENT: No bruits, no goiter.  Heart: Regular rate and rhythm. Lungs: Good air movement, clear Abdomen: Soft, nontender, nondistended, positive bowel sounds.  Neuro: Grossly intact, nonfocal.   Data Reviewed: Basic Metabolic Panel:  Recent Labs Lab 04/11/14 1933 04/12/14 0500  NA 138 138  K 3.9 3.7  CL 100 100  CO2  --  30  GLUCOSE 96 94  BUN 13 14  CREATININE 0.60 0.71  CALCIUM  --  8.9   Liver Function Tests: No results for input(s): AST, ALT, ALKPHOS, BILITOT, PROT, ALBUMIN in the last 168 hours. No results for input(s):  LIPASE, AMYLASE in the last 168 hours. No results for input(s): AMMONIA in the last 168 hours. CBC:  Recent Labs Lab 04/11/14 1927 04/11/14 1933 04/12/14 0500  WBC 6.5  --  5.3  NEUTROABS 3.7  --   --   HGB 12.4 13.3 12.2  HCT 37.6 39.0 37.4  MCV 85.3  --  86.2  PLT 222  --  199   Cardiac Enzymes: No results for input(s): CKTOTAL, CKMB, CKMBINDEX, TROPONINI in the last 168 hours. BNP (last 3 results) No results for input(s): BNP in the last 8760 hours.  ProBNP (last 3 results) No results for input(s): PROBNP in the last 8760 hours.  CBG: No results for input(s): GLUCAP in the last 168 hours.  No results found for this or any previous visit (from the past 240 hour(s)).   Studies: Ct Hip Left Wo Contrast  04/12/2014   CLINICAL DATA:  LEFT hip pain. Fall on Christmas Eve. Pelvic fractures on prior radiographs. Initial encounter.  EXAM: CT OF THE LEFT HIP WITHOUT CONTRAST  TECHNIQUE: Multidetector CT imaging of the left hip was performed according to the standard protocol. Multiplanar CT image reconstructions were also generated.  COMPARISON:  04/11/2014.  FINDINGS: Mildly displaced LEFT obturator ring fractures are present. The root of the LEFT superior pubic ramus fracture is minimally displaced. There is no acetabular  extension. Mild displacement of approximately 1 shaft width of the inferior pubic ramus is present. Both of these fractures appear acute or subacute, with crisp margins. No obturator hematoma. Mild edema is present around the obturator ring consistent with acute or subacute fracture.  Additionally, there is a LEFT sacral ala fracture. Sclerosis extends transversely across the S2 vertebra. The sclerosis suggests subacute to chronic fracture. Sclerosis is also present in the LEFT parasymphyseal pubis, suggesting healing fracture.  The LEFT femoral neck is intact. The visceral pelvis appears within normal limits.  Compared to CT 03/26/2013, there is no sclerosis in the sacrum  or parasymphyseal pubis.  IMPRESSION: LEFT obturator ring fractures, with sclerosis of the LEFT parasymphyseal pubis and LEFT sacral ala that suggests acute on chronic injury. The root of the LEFT superior pubic ramus and LEFT inferior pubic ramus fractures appear subacute or acute, without sclerosis or ossified callus whereas the pubic symphysis fracture is probably chronic and the LEFT sacral ala fracture is probably acute on chronic.   Electronically Signed   By: Dereck Ligas M.D.   On: 04/12/2014 11:56   Dg Knee Complete 4 Views Left  04/11/2014   CLINICAL DATA:  Left knee pain following falls, initial encounter  EXAM: LEFT KNEE - COMPLETE 4+ VIEW  COMPARISON:  12/23/2011  FINDINGS: Left knee prosthesis is seen. No acute abnormality is noted. No joint effusion is seen. No fracture is noted.  IMPRESSION: Status post knee replacement.  No acute abnormality is noted.   Electronically Signed   By: Inez Catalina M.D.   On: 04/11/2014 15:31   Dg Hip Unilat With Pelvis 2-3 Views Left  04/11/2014   CLINICAL DATA:  Left hip pain the radiates down the left knee. Fall 3 months ago  EXAM: LEFT HIP (WITH PELVIS) 2-3 VIEWS  COMPARISON:  Plain film 01/23/2014  FINDINGS: There is a cortical disruption along the left superior pubic ramus consistent with fracture. Fracture appears to extend into the acetabulum.There is a double density shadow in the inferior pubic ramus on the left consists with fracture. No evidence of left femoral neck fracture.  IMPRESSION: Minimally displaced fractures of the left superior and inferior pubic rami.   Electronically Signed   By: Suzy Bouchard M.D.   On: 04/11/2014 15:31    Scheduled Meds: . calcium-vitamin D  1 tablet Oral q morning - 10a  . enoxaparin (LOVENOX) injection  30 mg Subcutaneous Q24H  . latanoprost  1 drop Both Eyes QHS  . levothyroxine  100 mcg Oral QAC breakfast  . multivitamin with minerals  1 tablet Oral q morning - 10a  . sodium chloride  3 mL Intravenous  Q12H  . timolol  1 drop Both Eyes BID   Continuous Infusions:    Charlynne Cousins  [facility] Pager [phone]. If 7PM-7AM, please contact night-coverage at [email or web address], password [facility] 04/13/2014, 10:19 AM  LOS: 2 days

## 2014-04-13 NOTE — Consult Note (Signed)
Consult:Stephanie Donovan. Referring Physician: Dr. Sudie Donovan is an 81 y.o. female.  HPI: She fell in December and injured her Left Pelvis. For some reason she came to the ER Sunday and had no treatment prior to that except an office visit at our office recently.  Past Medical History  Diagnosis Date  . Blood transfusion without reported diagnosis   . Thyroid disease   . GI bleed   . Glaucoma     Past Surgical History  Procedure Laterality Date  . Joint replacement  11/20/2011    LTKR  . Colonoscopy      Family History  Problem Relation Age of Onset  . Osteoporosis Mother   . Cancer - Colon Father   . Schizophrenia Son     Social History:  reports that she has never smoked. She has never used smokeless tobacco. She reports that she does not drink alcohol or use illicit drugs.  Allergies:  Allergies  Allergen Reactions  . Aspirin     Mixed reactions  . Penicillins     Lost hearing temporarily  . Codeine Palpitations    Medications: I have reviewed the patient's current medications.  Results for orders placed or performed during the hospital encounter of 04/11/14 (from the past 48 hour(s))  CBC with Differential     Status: None   Collection Time: 04/11/14  7:27 PM  Result Value Ref Range   WBC 6.5 4.0 - 10.5 K/uL   RBC 4.41 3.87 - 5.11 MIL/uL   Hemoglobin 12.4 12.0 - 15.0 g/dL   HCT 37.6 36.0 - 46.0 %   MCV 85.3 78.0 - 100.0 fL   MCH 28.1 26.0 - 34.0 pg   MCHC 33.0 30.0 - 36.0 g/dL   RDW 13.6 11.5 - 15.5 %   Platelets 222 150 - 400 K/uL   Neutrophils Relative % 57 43 - 77 %   Neutro Abs 3.7 1.7 - 7.7 K/uL   Lymphocytes Relative 28 12 - 46 %   Lymphs Abs 1.9 0.7 - 4.0 K/uL   Monocytes Relative 12 3 - 12 %   Monocytes Absolute 0.8 0.1 - 1.0 K/uL   Eosinophils Relative 2 0 - 5 %   Eosinophils Absolute 0.1 0.0 - 0.7 K/uL   Basophils Relative 1 0 - 1 %   Basophils Absolute 0.1 0.0 - 0.1 K/uL  I-stat chem 8, ed     Status: None   Collection  Time: 04/11/14  7:33 PM  Result Value Ref Range   Sodium 138 135 - 145 mmol/L   Potassium 3.9 3.5 - 5.1 mmol/L   Chloride 100 96 - 112 mmol/L   BUN 13 6 - 23 mg/dL   Creatinine, Ser 0.60 0.50 - 1.10 mg/dL   Glucose, Bld 96 70 - 99 mg/dL   Calcium, Ion 1.15 1.13 - 1.30 mmol/L   TCO2 23 0 - 100 mmol/L   Hemoglobin 13.3 12.0 - 15.0 g/dL   HCT 39.0 36.0 - 64.3 %  Basic metabolic panel     Status: Abnormal   Collection Time: 04/12/14  5:00 AM  Result Value Ref Range   Sodium 138 135 - 145 mmol/L   Potassium 3.7 3.5 - 5.1 mmol/L   Chloride 100 96 - 112 mmol/L   CO2 30 19 - 32 mmol/L   Glucose, Bld 94 70 - 99 mg/dL   BUN 14 6 - 23 mg/dL   Creatinine, Ser 0.71 0.50 - 1.10 mg/dL   Calcium  8.9 8.4 - 10.5 mg/dL   GFR calc non Af Amer 77 (L) >90 mL/min   GFR calc Af Amer 89 (L) >90 mL/min    Comment: (NOTE) The eGFR has been calculated using the CKD EPI equation. This calculation has not been validated in all clinical situations. eGFR's persistently <90 mL/min signify possible Chronic Kidney Disease.    Anion gap 8 5 - 15  CBC     Status: None   Collection Time: 04/12/14  5:00 AM  Result Value Ref Range   WBC 5.3 4.0 - 10.5 K/uL   RBC 4.34 3.87 - 5.11 MIL/uL   Hemoglobin 12.2 12.0 - 15.0 g/dL   HCT 37.4 36.0 - 46.0 %   MCV 86.2 78.0 - 100.0 fL   MCH 28.1 26.0 - 34.0 pg   MCHC 32.6 30.0 - 36.0 g/dL   RDW 13.8 11.5 - 15.5 %   Platelets 199 150 - 400 K/uL    Ct Hip Left Wo Contrast  04/12/2014   CLINICAL DATA:  LEFT hip Donovan. Fall on Christmas Eve. Pelvic fractures on prior radiographs. Initial encounter.  EXAM: CT OF THE LEFT HIP WITHOUT CONTRAST  TECHNIQUE: Multidetector CT imaging of the left hip was performed according to the standard protocol. Multiplanar CT image reconstructions were also generated.  COMPARISON:  04/11/2014.  FINDINGS: Mildly displaced LEFT obturator ring fractures are present. The root of the LEFT superior pubic ramus fracture is minimally displaced. There is  no acetabular extension. Mild displacement of approximately 1 shaft width of the inferior pubic ramus is present. Both of these fractures appear acute or subacute, with crisp margins. No obturator hematoma. Mild edema is present around the obturator ring consistent with acute or subacute fracture.  Additionally, there is a LEFT sacral ala fracture. Sclerosis extends transversely across the S2 vertebra. The sclerosis suggests subacute to chronic fracture. Sclerosis is also present in the LEFT parasymphyseal pubis, suggesting healing fracture.  The LEFT femoral neck is intact. The visceral pelvis appears within normal limits.  Compared to CT 03/26/2013, there is no sclerosis in the sacrum or parasymphyseal pubis.  IMPRESSION: LEFT obturator ring fractures, with sclerosis of the LEFT parasymphyseal pubis and LEFT sacral ala that suggests acute on chronic injury. The root of the LEFT superior pubic ramus and LEFT inferior pubic ramus fractures appear subacute or acute, without sclerosis or ossified callus whereas the pubic symphysis fracture is probably chronic and the LEFT sacral ala fracture is probably acute on chronic.   Electronically Signed   By: Geoffrey  Lamke M.D.   On: 04/12/2014 11:56   Dg Knee Complete 4 Views Left  04/11/2014   CLINICAL DATA:  Left knee Donovan following falls, initial encounter  EXAM: LEFT KNEE - COMPLETE 4+ VIEW  COMPARISON:  12/23/2011  FINDINGS: Left knee prosthesis is seen. No acute abnormality is noted. No joint effusion is seen. No fracture is noted.  IMPRESSION: Status post knee replacement.  No acute abnormality is noted.   Electronically Signed   By: Mark  Lukens M.D.   On: 04/11/2014 15:31   Dg Hip Unilat With Pelvis 2-3 Views Left  04/11/2014   CLINICAL DATA:  Left hip Donovan the radiates down the left knee. Fall 3 months ago  EXAM: LEFT HIP (WITH PELVIS) 2-3 VIEWS  COMPARISON:  Plain film 01/23/2014  FINDINGS: There is a cortical disruption along the left superior pubic ramus  consistent with fracture. Fracture appears to extend into the acetabulum.There is a double density shadow in the   inferior pubic ramus on the left consists with fracture. No evidence of left femoral neck fracture.  IMPRESSION: Minimally displaced fractures of the left superior and inferior pubic rami.   Electronically Signed   By: Suzy Bouchard M.D.   On: 04/11/2014 15:31    Review of Systems  Constitutional: Negative.   HENT: Negative.   Eyes: Negative.   Respiratory: Negative.   Cardiovascular: Negative.   Gastrointestinal: Negative.   Genitourinary: Negative.   Musculoskeletal: Positive for joint Donovan and falls.       Donovan in Left Hip region  Skin: Negative.   Neurological: Negative.   Endo/Heme/Allergies: Negative.   Psychiatric/Behavioral: Negative.    Blood pressure 126/73, pulse 95, temperature 97.6 F (36.4 C), temperature source Oral, resp. rate 18, height 5' 2" (1.575 m), weight 65.3 kg (143 lb 15.4 oz), SpO2 96 %. Physical Exam  Constitutional: She appears well-developed.  HENT:  Head: Normocephalic.  Eyes: Pupils are equal, round, and reactive to light.  Neck: Normal range of motion.  Cardiovascular: Normal rate.   Respiratory: Effort normal.  GI: Soft.  Musculoskeletal:  Painful range of  Motion, in thigh and not groin. She has a Left  Total Knee.  Neurological: She is alert.  Skin: Skin is warm.  Psychiatric: She has a normal mood and affect.    Assessment/Plan: I will order an MRI of her Left Hip despite a Normal CT scan of this same hip/  Stephanie Donovan A 04/13/2014, 10:06 AM

## 2014-04-14 LAB — LACTATE DEHYDROGENASE: LDH: 121 U/L (ref 94–250)

## 2014-04-14 LAB — C-REACTIVE PROTEIN: CRP: 0.7 mg/dL — ABNORMAL HIGH (ref <0.60)

## 2014-04-14 MED ORDER — TRAMADOL-ACETAMINOPHEN 37.5-325 MG PO TABS: 1.0000 | ORAL_TABLET | Freq: Four times a day (QID) | ORAL | Status: DC | PRN

## 2014-04-14 MED ORDER — CYCLOBENZAPRINE HCL 5 MG PO TABS: 5.0000 mg | ORAL_TABLET | Freq: Two times a day (BID) | ORAL | Status: DC | PRN

## 2014-04-14 MED ORDER — POLYETHYLENE GLYCOL 3350 17 G PO PACK: 17.0000 g | PACK | Freq: Every day | ORAL | Status: DC

## 2014-04-14 MED ORDER — BISACODYL 10 MG RE SUPP
10.0000 mg | Freq: Once | RECTAL | Status: AC
  Administered 2014-04-14: 10 mg via RECTAL
  Filled 2014-04-14: qty 1

## 2014-04-14 MED ORDER — HYDROCODONE-ACETAMINOPHEN 5-325 MG PO TABS: 1.0000 | ORAL_TABLET | ORAL | Status: DC | PRN

## 2014-04-14 NOTE — Discharge Summary (Signed)
Physician Discharge Summary  Stephanie Donovan TWK:[id number] DOB: [birth year] DOA: 04/11/2014  PCP: [name], MD  Admit date: 04/11/2014 Discharge date: 04/14/2014  Time spent: 25 minutes  Recommendations for Outpatient Follow-up:  1. Discharged to skilled nursing facility for ongoing PT needs. Please follow SPEP, UPEP, B2 microglobulin , LDH and  CRP sent for multiple myeloma workup on 3/23 as outpatient.  Discharge Diagnoses:  Principal Problem:   Fracture of multiple pubic rami  Active Problems:   Hypothyroidism   Left hip pain   Discharge Condition: fair  Diet recommendation: Regular  Filed Weights   04/11/14 2010  Weight: 65.3 kg (143 lb 15.4 oz)    History of present illness:  Please refer to admission H&P from 04/11/2014 for details, in brief, 81 year old female with history of hypothyroidism and glaucoma presented to the ED with uncontrolled pain of left hip and knee since she had a fall 3 months back. She had x-rays done in early January which were negative for any fractures. She has been prescribed pain medications and outpatient physical therapy without much relief. She had increased pain on the day of admission with a difficulty meeting weight with pain mainly around the left hip and left knee. In the ED x-ray of the pelvis showed fracture of the left superior and inferior pubic rami. X-ray of the knee was unremarkable. Patient admitted to hospitalist service.   Hospital Course:  Fracture of suprapubic area and inferior left pubic ramus Patient having ongoing pain since her fall 3 months back. Fracture not seen on prior x-ray. X-ray of the left hip showing superior and inferior left pubic ramus fracture. CT of the left hip was done with concern for left acetabular involvement on x-ray and showed a mildly displaced left obturator ring fracture with sclerosis of the left parasymphyseal pubis and left sacral alla suggestive of acute on chronic injury. [facility] was consulted as she was seen in the office recently. An MRI of the hip was done to further evaluate and again showed no femoral neck fracture, acute left upper ureter ring fractures and nondisplaced bilateral sacral alae fracture which was unchanged compared to the CT scan. -No further recommendations for surgery besides pain control and physical therapy. Recommend evaluating her for multiple myeloma.  -Patient's calcium and total protein is low. SPEP, UPEP, B2 microglobulin, LDH and CRP have been ordered and should be followed up as outpatient. -Patient will be discharged on when necessary Vicodin, Flexeril as needed for muscle spasms. Will add Ultracet for additional pain control.  Added bowel regimen. Patient has been able to ambulate to the bathroom with help of a walker. Physical therapist recommend skilled nursing facility.  Hypothyroidism Continue Synthroid  History of glaucoma Continue home medications  CODE STATUS: Full code  Family communication: None at bedside Disposition: Skilled nursing facility as per physical therapy    Procedures:  CT of the pelvis  MRI pelvis  Consultations:  [facility] (Dr Gladstone Lighter)  Discharge Exam: Filed Vitals:   04/14/14 0610  BP: 127/80  Pulse: 94  Temp: 98.3 F (36.8 C)  Resp: 16    General: Elderly female lying in bed in no acute distress HEENT: No pallor, moist oral mucosa Chest: Clear to auscultation bilaterally, no added sounds CVS: Normal S1 and S2, no murmurs GI: Soft, nondistended, nontender, bowel present Musculoskeletal: Warm, limited ROM of left hip CNS: Alert and oriented  Discharge Instructions    Current Discharge Medication List    START taking these medications  Details  polyethylene glycol (MIRALAX / GLYCOLAX) packet Take 17 g by mouth daily. Qty: 14 each, Refills: 0    traMADol-acetaminophen (ULTRACET) 37.5-325 MG per tablet Take 1 tablet by mouth every 6 (six) hours as  needed. Qty: 30 tablet, Refills: 0      CONTINUE these medications which have CHANGED   Details  cyclobenzaprine (FLEXERIL) 5 MG tablet Take 1 tablet (5 mg total) by mouth 3 times/day as needed-between meals & bedtime for muscle spasms. Qty: 30 tablet, Refills: 0    HYDROcodone-acetaminophen (NORCO) 5-325 MG per tablet Take 1 tablet by mouth every 4 (four) hours as needed. Qty: 20 tablet, Refills: 0      CONTINUE these medications which have NOT CHANGED   Details  calcium-vitamin D (OSCAL WITH D) 500-200 MG-UNIT per tablet Take 1 tablet by mouth every morning.    ibuprofen (ADVIL,MOTRIN) 200 MG tablet Take 200 mg by mouth every 6 (six) hours as needed for headache or moderate pain (pain).     latanoprost (XALATAN) 0.005 % ophthalmic solution Place 1 drop into both eyes at bedtime.    levothyroxine (SYNTHROID, LEVOTHROID) 100 MCG tablet Take 100 mcg by mouth daily before breakfast.    Multiple Vitamin (MULTIVITAMIN WITH MINERALS) TABS Take 1 tablet by mouth every morning.    timolol (TIMOPTIC) 0.5 % ophthalmic solution Place 1 drop into both eyes 2 (two) times daily.      STOP taking these medications     gabapentin (NEURONTIN) 300 MG capsule      traMADol (ULTRAM) 50 MG tablet        Allergies  Allergen Reactions  . Aspirin     Mixed reactions  . Penicillins     Lost hearing temporarily  . Codeine Palpitations   Follow-up Information    Please follow up.   Why:  With MD at SNF       The results of significant diagnostics from this hospitalization (including imaging, microbiology, ancillary and laboratory) are listed below for reference.    Significant Diagnostic Studies: Ct Hip Left Wo Contrast  04/12/2014   CLINICAL DATA:  LEFT hip pain. Fall on Christmas Eve. Pelvic fractures on prior radiographs. Initial encounter.  EXAM: CT OF THE LEFT HIP WITHOUT CONTRAST  TECHNIQUE: Multidetector CT imaging of the left hip was performed according to the standard protocol.  Multiplanar CT image reconstructions were also generated.  COMPARISON:  04/11/2014.  FINDINGS: Mildly displaced LEFT obturator ring fractures are present. The root of the LEFT superior pubic ramus fracture is minimally displaced. There is no acetabular extension. Mild displacement of approximately 1 shaft width of the inferior pubic ramus is present. Both of these fractures appear acute or subacute, with crisp margins. No obturator hematoma. Mild edema is present around the obturator ring consistent with acute or subacute fracture.  Additionally, there is a LEFT sacral ala fracture. Sclerosis extends transversely across the S2 vertebra. The sclerosis suggests subacute to chronic fracture. Sclerosis is also present in the LEFT parasymphyseal pubis, suggesting healing fracture.  The LEFT femoral neck is intact. The visceral pelvis appears within normal limits.  Compared to CT 03/26/2013, there is no sclerosis in the sacrum or parasymphyseal pubis.  IMPRESSION: LEFT obturator ring fractures, with sclerosis of the LEFT parasymphyseal pubis and LEFT sacral ala that suggests acute on chronic injury. The root of the LEFT superior pubic ramus and LEFT inferior pubic ramus fractures appear subacute or acute, without sclerosis or ossified callus whereas the pubic symphysis fracture is probably  chronic and the LEFT sacral ala fracture is probably acute on chronic.   Electronically Signed   By: Dereck Ligas M.D.   On: 04/12/2014 11:56   Mr Hip Left Wo Contrast  04/13/2014   CLINICAL DATA:  Pelvic fractures. History of falls. Hip pain radiating to the LEFT thigh.  EXAM: MR OF THE LEFT HIP WITHOUT CONTRAST  TECHNIQUE: Multiplanar, multisequence MR imaging was performed. No intravenous contrast was administered.  COMPARISON:  10/13/2014.  FINDINGS: LEFT obturator ring fractures identified on prior CT are again noted. The acute appearing fractures are in the mid inferior pubic ramus on the LEFT and at the root of the LEFT  superior pubic ramus. There is low level edema in the pubic bones bilaterally, suggesting healing or chronic fractures. This is congruent with findings on prior CT. Necks are visible and there is no femur fracture.  Moderate RIGHT hip osteoarthritis is present with subchondral acetabular cysts and femoral head cysts. There are bilateral sacral alae fracture is also visible on the coronal imaging. Bone marrow edema in the medial RIGHT iliac bone also suggests associated insufficiency fracture. The LEFT iliac bone appears normal. Reactive edema in the LEFT obturator foramen and LEFT adductor compartment muscles associated with obturator ring fracture. Edema in the root of the RIGHT superior pubic ramus which may be secondary to stress reaction from instability or an older fracture of the RIGHT obturator ring. No fracture plane is visible. Partially visualized lumbar spondylosis.  IMPRESSION: 1. Negative for femoral neck fractures, which is the chief concern for today imaging. 2. Acute LEFT obturator ring fractures are unchanged compared to yesterday CT scan. 3. Nondisplaced bilateral sacral alae fractures. Medial RIGHT iliac bone insufficiency fracture. 4. Edema in the pubic symphysis bilaterally correlates with sclerosis on the prior CT and probably represents healing fractures. Low level edema at the root of the RIGHT superior pubic ramus suggests either stress reaction or healing fracture. 5. Correlating prior studies, the findings suggest repeated falls would acute on chronic pelvic fractures.   Electronically Signed   By: Dereck Ligas M.D.   On: 04/13/2014 12:11   Dg Knee Complete 4 Views Left  04/11/2014   CLINICAL DATA:  Left knee pain following falls, initial encounter  EXAM: LEFT KNEE - COMPLETE 4+ VIEW  COMPARISON:  12/23/2011  FINDINGS: Left knee prosthesis is seen. No acute abnormality is noted. No joint effusion is seen. No fracture is noted.  IMPRESSION: Status post knee replacement.  No acute  abnormality is noted.   Electronically Signed   By: Inez Catalina M.D.   On: 04/11/2014 15:31   Dg Hip Unilat With Pelvis 2-3 Views Left  04/11/2014   CLINICAL DATA:  Left hip pain the radiates down the left knee. Fall 3 months ago  EXAM: LEFT HIP (WITH PELVIS) 2-3 VIEWS  COMPARISON:  Plain film 01/23/2014  FINDINGS: There is a cortical disruption along the left superior pubic ramus consistent with fracture. Fracture appears to extend into the acetabulum.There is a double density shadow in the inferior pubic ramus on the left consists with fracture. No evidence of left femoral neck fracture.  IMPRESSION: Minimally displaced fractures of the left superior and inferior pubic rami.   Electronically Signed   By: Suzy Bouchard M.D.   On: 04/11/2014 15:31    Microbiology: No results found for this or any previous visit (from the past 240 hour(s)).   Labs: Basic Metabolic Panel:  Recent Labs Lab 04/11/14 1933 04/12/14 0500  NA 138  138  K 3.9 3.7  CL 100 100  CO2  --  30  GLUCOSE 96 94  BUN 13 14  CREATININE 0.60 0.71  CALCIUM  --  8.9   Liver Function Tests: No results for input(s): AST, ALT, ALKPHOS, BILITOT, PROT, ALBUMIN in the last 168 hours. No results for input(s): LIPASE, AMYLASE in the last 168 hours. No results for input(s): AMMONIA in the last 168 hours. CBC:  Recent Labs Lab 04/11/14 1927 04/11/14 1933 04/12/14 0500  WBC 6.5  --  5.3  NEUTROABS 3.7  --   --   HGB 12.4 13.3 12.2  HCT 37.6 39.0 37.4  MCV 85.3  --  86.2  PLT 222  --  199   Cardiac Enzymes: No results for input(s): CKTOTAL, CKMB, CKMBINDEX, TROPONINI in the last 168 hours. BNP: BNP (last 3 results) No results for input(s): BNP in the last 8760 hours.  ProBNP (last 3 results) No results for input(s): PROBNP in the last 8760 hours.  CBG: No results for input(s): GLUCAP in the last 168 hours.     SignedLouellen Molder  [facility] 04/14/2014, 10:30 AM

## 2014-04-14 NOTE — Progress Notes (Signed)
Clinical Social Work Department BRIEF PSYCHOSOCIAL ASSESSMENT 04/14/2014  Patient:  Stephanie Donovan, Stephanie Donovan     Account Number:  [id number]     [place] date:  04/11/2014  Clinical Social Worker:  Lacie Scotts  Date/Time:  04/14/2014 01:08 PM  Referred by:  CSW  Date Referred:  04/14/2014 Referred for  SNF Placement   Other Referral:   Interview type:  Patient Other interview type:    PSYCHOSOCIAL DATA Living Status:  ALONE Admitted from facility:   Level of care:   Primary support name:  [place] Primary support relationship to patient:  CHILD, ADULT Degree of support available:   limited    CURRENT CONCERNS Current Concerns  Post-Acute Placement   Other Concerns:    SOCIAL WORK ASSESSMENT / PLAN Pt is an 81 yr old female living at home prior to hospitalization. Pt admitted with multiple pubic rami fractures. Surgery has not been required. CSW met with pt to assist with d/c planning. PN reviewed. Spoke with PT , RNCM, MD. ST Rehab is needed. Pt has an observation status and is unable to use her medicare to assist with cost of rehab. Surveyor, quantity of CSW contacted and 30 day LOG approved. SNF search initiated and bed offers provided. Pt has chosen [facility] for placement. PT has approved transport by car. Pt will be d/c to SNF today.   Assessment/plan status:  No Further Intervention Required Other assessment/ plan:   Information/referral to community resources:   LOG reviewed. Encouraged pt to apply for medicaid ASAP. Pt is willing to do this.    PATIENT'S/FAMILY'S RESPONSE TO PLAN OF CARE: " I can't go back home feeling like this. I'm going to fall." Pt's daughter works and is unable to assist. " I'm usually so independent. I drive myself to my son's group home to visit. I need to get past this pain. " Pt is very grateful for [facility]'s assistance with placement. She is motivated to work with therapy and regain her independence.    Werner Lean LCSW [phone]

## 2014-04-14 NOTE — Progress Notes (Signed)
SNF bed available today at [facility]. [facility] will provide LOG. Pt is in agreement with this plan. Full assessment to follow.  Werner Lean LCSW [phone]

## 2014-04-14 NOTE — Discharge Instructions (Signed)
Stable Pelvic Fracture °You have one or more fractures (this means there is a break in the bones) of the pelvis. The pelvis is the ring of bones that make up your hipbones. These are the bones you sit on and the lower part of the spine. It is like a boney ring where your legs attach and which supports your upper body. You have an undisplaced fracture. This means the bones are in good position. The pelvic fracture you have is a simple (uncomplicated) fracture. °DIAGNOSIS  °X-rays usually diagnose these fractures. °TREATMENT  °The goal of treating pelvic fractures is to get the bones to heal in a good position. The patient should return to normal activities as soon as possible. Such fractures are often treated with normal bed rest and conservative measures.  °HOME CARE INSTRUCTIONS  °· You should be on bed rest for as long as directed by your caregiver. Change positions of your legs every 1-2 hours to maintain good blood flow. You may sit as long as is tolerable. Following this, you may do usual activities, but avoid strenuous activities for as long as directed by your caregiver. °· Only take over-the-counter or prescription medicines for pain, discomfort, or fever as directed by your caregiver. °· Bed rest may also be used for discomfort. °· Resume your activities when you are able. Use a cane or crutch on the injured side to reduce pain while walking, as needed. °· If you develop increased pain or discomfort not relieved with medications, contact your caregiver. °· Warning: Do not drive a car or operate a motor vehicle until your caregiver specifically tells you it is safe to do so. °SEEK IMMEDIATE MEDICAL CARE IF:  °· You feel light-headed or faint, develop chest pain or shortness of breath. °· An unexplained oral temperature above 102° F (38.9° C) develops. °· You develop blood in the urine or in the stools. °· There is difficulty urinating, and/or having a bowel movement, or pain with these efforts. °· There is a  difficulty or increased pain with walking. °· There is swelling in one or both legs that is not normal. °Document Released: 03/19/2001 Document Revised: 05/25/2013 Document Reviewed: 08/22/2007 °ExitCare® Patient Information ©2015 [employer]. This information is not intended to replace advice given to you by your health care provider. Make sure you discuss any questions you have with your health care provider. ° °

## 2014-04-14 NOTE — Progress Notes (Signed)
Clinical Social Work Department CLINICAL SOCIAL WORK PLACEMENT NOTE 04/14/2014  Patient:  Stephanie Donovan, Stephanie Donovan  Account Number:  [id number] Admit date:  04/11/2014  Clinical Social Worker:  Werner Lean, LCSW  Date/time:  04/14/2014 01:22 PM  Clinical Social Work is seeking post-discharge placement for this patient at the following level of care:   SKILLED NURSING   (*CSW will update this form in Epic as items are completed)   04/14/2014  Patient/family provided with [place] Department of Clinical Social Work's list of facilities offering this level of care within the geographic area requested by the patient (or if unable, by the patient's family).  04/14/2014  Patient/family informed of their freedom to choose among providers that offer the needed level of care, that participate in Medicare, Medicaid or managed care program needed by the patient, have an available bed and are willing to accept the patient.    Patient/family informed of [facility]' ownership interest in [facility], as well as of the fact that they are under no obligation to receive care at this facility.  PASARR submitted to EDS on 04/14/2014 PASARR number received on 04/14/2014  FL2 transmitted to all facilities in geographic area requested by pt/family on  04/14/2014 FL2 transmitted to all facilities within larger geographic area on   Patient informed that his/her managed care company has contracts with or will negotiate with  certain facilities, including the following:     Patient/family informed of bed offers received:  04/14/2014 Patient chooses bed at [facility] [place] Physician recommends and patient chooses bed at    Patient to be transferred to [facility] [place] on  04/14/2014 Patient to be transferred to facility by [place] Patient and family notified of transfer on 04/14/2014 Name of family member notified:  Pt called daughter directly.  The following  physician request were entered in Epic:   Additional Comments: Pt in agreement with d/c to SNF today. PT approved transport by car. NSG reviewed d/c summary, scripts, avs. Scripts included in d/c packet. Packet provided to pt by CSW prior to d/c.  Werner Lean LCSW [phone]

## 2014-04-14 NOTE — Progress Notes (Signed)
Subjective:     Patient reports pain as 3 on 0-10 scale.  MRI of her Left Hip was normal in regards to her hip. She has the fractures that were noted on her CT Scan.Her knee exam on the Left was fine. I have nothing further to add at this time.May be helpful to evaluate her for Multiple Myeloma by ordering the appropiate lab studies. She will need transfer to SNF.  Objective: Vital signs in last 24 hours: Temp:  [98.3 F (36.8 C)-98.6 F (37 C)] 98.3 F (36.8 C) (03/23 0610) Pulse Rate:  [94-95] 94 (03/23 0610) Resp:  [16-18] 16 (03/23 0610) BP: (107-127)/(63-80) 127/80 mmHg (03/23 0610) SpO2:  [94 %-98 %] 96 % (03/23 0610)  Intake/Output from previous day: 03/22 0701 - 03/23 0700 In: 640 [P.O.:640] Out: -  Intake/Output this shift:     Recent Labs  04/11/14 1927 04/11/14 1933 04/12/14 0500  HGB 12.4 13.3 12.2    Recent Labs  04/11/14 1927 04/11/14 1933 04/12/14 0500  WBC 6.5  --  5.3  RBC 4.41  --  4.34  HCT 37.6 39.0 37.4  PLT 222  --  199    Recent Labs  04/11/14 1933 04/12/14 0500  NA 138 138  K 3.9 3.7  CL 100 100  CO2  --  30  BUN 13 14  CREATININE 0.60 0.71  GLUCOSE 96 94  CALCIUM  --  8.9   No results for input(s): LABPT, INR in the last 72 hours.  Neurologically intact  Assessment/Plan:     Up with therapy  Stephanie Donovan A 04/14/2014, 7:30 AM

## 2014-04-15 ENCOUNTER — Non-Acute Institutional Stay (SKILLED_NURSING_FACILITY): Payer: Medicare Other | Admitting: Adult Health

## 2014-04-15 DIAGNOSIS — K5909 Other constipation: Secondary | ICD-10-CM

## 2014-04-15 DIAGNOSIS — S32502A Unspecified fracture of left pubis, initial encounter for closed fracture: Secondary | ICD-10-CM | POA: Diagnosis not present

## 2014-04-15 DIAGNOSIS — H409 Unspecified glaucoma: Secondary | ICD-10-CM

## 2014-04-15 DIAGNOSIS — E039 Hypothyroidism, unspecified: Secondary | ICD-10-CM

## 2014-04-15 DIAGNOSIS — K5900 Constipation, unspecified: Secondary | ICD-10-CM

## 2014-04-15 DIAGNOSIS — S32592A Other specified fracture of left pubis, initial encounter for closed fracture: Secondary | ICD-10-CM

## 2014-04-15 LAB — PROTEIN ELECTROPHORESIS, SERUM
A/G Ratio: 0.9 (ref 0.7–2.0)
ALPHA-2-GLOBULIN: 0.7 g/dL (ref 0.4–1.2)
Albumin ELP: 2.9 g/dL — ABNORMAL LOW (ref 3.2–5.6)
Alpha-1-Globulin: 0.3 g/dL (ref 0.1–0.4)
Beta Globulin: 1 g/dL (ref 0.6–1.3)
GAMMA GLOBULIN: 1.1 g/dL (ref 0.5–1.6)
GLOBULIN, TOTAL: 3.1 g/dL (ref 2.0–4.5)
Total Protein ELP: 6 g/dL (ref 6.0–8.5)

## 2014-04-15 LAB — BETA 2 MICROGLOBULIN, SERUM: BETA 2 MICROGLOBULIN: 1.8 mg/L (ref 0.6–2.4)

## 2014-04-26 ENCOUNTER — Non-Acute Institutional Stay (SKILLED_NURSING_FACILITY): Payer: Medicare Other | Admitting: Internal Medicine

## 2014-04-26 DIAGNOSIS — S32509D Unspecified fracture of unspecified pubis, subsequent encounter for fracture with routine healing: Secondary | ICD-10-CM

## 2014-04-26 DIAGNOSIS — H409 Unspecified glaucoma: Secondary | ICD-10-CM | POA: Diagnosis not present

## 2014-04-26 DIAGNOSIS — S32599D Other specified fracture of unspecified pubis, subsequent encounter for fracture with routine healing: Secondary | ICD-10-CM

## 2014-04-26 DIAGNOSIS — E039 Hypothyroidism, unspecified: Secondary | ICD-10-CM | POA: Diagnosis not present

## 2014-04-26 DIAGNOSIS — M2550 Pain in unspecified joint: Secondary | ICD-10-CM

## 2014-04-26 NOTE — Progress Notes (Signed)
Patient ID: Stephanie Donovan, female   DOB: [birth year], 81 y.o.   MRN: [id number]    HISTORY AND PHYSICAL  04/19/14  Location:    [facility]   Place of Service:   SNF  Extended Emergency Contact Information Primary Emergency Contact: [place] Address: [address], [place] [address] [place] Phone: [phone] Relation: Daughter  Advanced Directive information   FULL CODE  Chief Complaint  Patient presents with  . New Admit To SNF    multiple pubic rami fx, hypothyroidism    HPI:  81 yo female seen today as a new admission into SNF following hospital stay for multiple pubic rami fx and hypothyroidism. She c/o left thigh pain and numbness anterior distal thigh. She has a hx left TKR. hospital records reviewed. She had a MRI lft hip which showed multiple pelvic acute/chronic fx's. Left knee xray was neg. Pain is controlled on ultracet and norco. No nursing issues. She is sleeping well.  She takes levothyroxine for thyroid d/o.  She has glaucoma and uses eye gtts.  Past Medical History  Diagnosis Date  . Blood transfusion without reported diagnosis   . Thyroid disease   . GI bleed   . Glaucoma     Past Surgical History  Procedure Laterality Date  . Joint replacement  11/20/2011    LTKR  . Colonoscopy      Patient Care Team: [name], MD as PCP - General (Family Medicine)  History   Social History  . Marital Status: Widowed    Spouse Name: N/A  . Number of Children: N/A  . Years of Education: N/A   Occupational History  . Not on file.   Social History Main Topics  . Smoking status: Never Smoker   . Smokeless tobacco: Never Used  . Alcohol Use: No  . Drug Use: No  . Sexual Activity: No   Other Topics Concern  . Not on file   Social History Narrative     reports that she has never smoked. She has never used smokeless tobacco. She reports that she does not drink alcohol or use  illicit drugs.  Family History  Problem Relation Age of Onset  . Osteoporosis Mother   . Cancer - Colon Father   . Schizophrenia Son    Family Status  Relation Status Death Age  . Mother Deceased   . Father Deceased   . Sister Alive   . Brother Alive   . Son Alive      There is no immunization history on file for this patient.  Allergies  Allergen Reactions  . Aspirin     Mixed reactions  . Penicillins     Lost hearing temporarily  . Codeine Palpitations    Medications: Patient's Medications  New Prescriptions   SENNA-DOCUSATE (SENOKOT-S) 8.6-50 MG PER TABLET    Take 2 tablets by mouth at bedtime.  Previous Medications   CALCIUM-VITAMIN D (OSCAL WITH D) 500-200 MG-UNIT PER TABLET    Take 1 tablet by mouth every morning.   CYCLOBENZAPRINE (FLEXERIL) 5 MG TABLET    Take 1 tablet (5 mg total) by mouth 3 times/day as needed-between meals & bedtime for muscle spasms.   HYDROCODONE-ACETAMINOPHEN (NORCO) 5-325 MG PER TABLET    Take 1 tablet by mouth every 4 (four) hours as needed.   IBUPROFEN (ADVIL,MOTRIN) 200 MG TABLET    Take 200 mg by mouth every 6 (  six) hours as needed for headache or moderate pain (pain).    LATANOPROST (XALATAN) 0.005 % OPHTHALMIC SOLUTION    Place 1 drop into both eyes at bedtime.   LEVOTHYROXINE (SYNTHROID, LEVOTHROID) 100 MCG TABLET    Take 100 mcg by mouth daily before breakfast.   MULTIPLE VITAMIN (MULTIVITAMIN WITH MINERALS) TABS    Take 1 tablet by mouth every morning.   POLYETHYLENE GLYCOL (MIRALAX / GLYCOLAX) PACKET    Take 17 g by mouth daily.   TIMOLOL (TIMOPTIC) 0.5 % OPHTHALMIC SOLUTION    Place 1 drop into both eyes 2 (two) times daily.   TRAMADOL-ACETAMINOPHEN (ULTRACET) 37.5-325 MG PER TABLET    Take 1 tablet by mouth every 6 (six) hours as needed.  Modified Medications   No medications on file  Discontinued Medications   No medications on file    Review of Systems  Constitutional: Negative for fever, chills, diaphoresis, activity  change, appetite change and fatigue.  HENT: Negative for ear pain and sore throat.   Eyes: Negative for visual disturbance.  Respiratory: Negative for cough, chest tightness and shortness of breath.   Cardiovascular: Negative for chest pain, palpitations and leg swelling.  Gastrointestinal: Negative for nausea, vomiting, abdominal pain, diarrhea, constipation and blood in stool.  Genitourinary: Negative for dysuria.  Musculoskeletal: Positive for arthralgias and gait problem.  Neurological: Positive for weakness and numbness. Negative for dizziness, tremors and headaches.  Psychiatric/Behavioral: Negative for sleep disturbance. The patient is not nervous/anxious.     Filed Vitals:   04/19/14 2244  BP: 138/77  Pulse: 88  Temp: 98 F (36.7 C)  SpO2: 97%   There is no weight on file to calculate BMI.  Physical Exam  Constitutional: She is oriented to person, place, and time. She appears well-developed and well-nourished. No distress.  HENT:  Mouth/Throat: Oropharynx is clear and moist. No oropharyngeal exudate.  Eyes: Pupils are equal, round, and reactive to light. No scleral icterus.  Neck: Neck supple. No tracheal deviation present. No thyroid mass and no thyromegaly present.  Cardiovascular: Normal rate, regular rhythm, normal heart sounds and intact distal pulses.  Exam reveals no gallop and no friction rub.   No murmur heard. No LE edema b/l. no calf TTP. No carotid bruit b/l  Pulmonary/Chest: Effort normal and breath sounds normal. No stridor. No respiratory distress. She has no wheezes. She has no rales.  Abdominal: Soft. Bowel sounds are normal. She exhibits no distension and no mass. There is no tenderness. There is no rebound and no guarding.  Musculoskeletal: She exhibits edema. She exhibits no tenderness.  Reduced ROM left hip. No pubic symphysis TTP  Lymphadenopathy:    She has no cervical adenopathy.  Neurological: She is alert and oriented to person, place, and time.  She has normal reflexes.  Skin: Skin is warm and dry. No rash noted.  Psychiatric: She has a normal mood and affect. Her behavior is normal. Judgment and thought content normal.     Labs reviewed: Admission on 04/11/2014, Discharged on 04/14/2014  Component Date Value Ref Range Status  . WBC 04/11/2014 6.5  4.0 - 10.5 K/uL Final  . RBC 04/11/2014 4.41  3.87 - 5.11 MIL/uL Final  . Hemoglobin 04/11/2014 12.4  12.0 - 15.0 g/dL Final  . HCT 04/11/2014 37.6  36.0 - 46.0 % Final  . MCV 04/11/2014 85.3  78.0 - 100.0 fL Final  . MCH 04/11/2014 28.1  26.0 - 34.0 pg Final  . MCHC 04/11/2014 33.0  30.0 -  36.0 g/dL Final  . RDW 04/11/2014 13.6  11.5 - 15.5 % Final  . Platelets 04/11/2014 222  150 - 400 K/uL Final  . Neutrophils Relative % 04/11/2014 57  43 - 77 % Final  . Neutro Abs 04/11/2014 3.7  1.7 - 7.7 K/uL Final  . Lymphocytes Relative 04/11/2014 28  12 - 46 % Final  . Lymphs Abs 04/11/2014 1.9  0.7 - 4.0 K/uL Final  . Monocytes Relative 04/11/2014 12  3 - 12 % Final  . Monocytes Absolute 04/11/2014 0.8  0.1 - 1.0 K/uL Final  . Eosinophils Relative 04/11/2014 2  0 - 5 % Final  . Eosinophils Absolute 04/11/2014 0.1  0.0 - 0.7 K/uL Final  . Basophils Relative 04/11/2014 1  0 - 1 % Final  . Basophils Absolute 04/11/2014 0.1  0.0 - 0.1 K/uL Final  . Sodium 04/11/2014 138  135 - 145 mmol/L Final  . Potassium 04/11/2014 3.9  3.5 - 5.1 mmol/L Final  . Chloride 04/11/2014 100  96 - 112 mmol/L Final  . BUN 04/11/2014 13  6 - 23 mg/dL Final  . Creatinine, Ser 04/11/2014 0.60  0.50 - 1.10 mg/dL Final  . Glucose, Bld 04/11/2014 96  70 - 99 mg/dL Final  . Calcium, Ion 04/11/2014 1.15  1.13 - 1.30 mmol/L Final  . TCO2 04/11/2014 23  0 - 100 mmol/L Final  . Hemoglobin 04/11/2014 13.3  12.0 - 15.0 g/dL Final  . HCT 04/11/2014 39.0  36.0 - 46.0 % Final  . Sodium 04/12/2014 138  135 - 145 mmol/L Final  . Potassium 04/12/2014 3.7  3.5 - 5.1 mmol/L Final  . Chloride 04/12/2014 100  96 - 112 mmol/L  Final  . CO2 04/12/2014 30  19 - 32 mmol/L Final  . Glucose, Bld 04/12/2014 94  70 - 99 mg/dL Final  . BUN 04/12/2014 14  6 - 23 mg/dL Final  . Creatinine, Ser 04/12/2014 0.71  0.50 - 1.10 mg/dL Final  . Calcium 04/12/2014 8.9  8.4 - 10.5 mg/dL Final  . GFR calc non Af Amer 04/12/2014 77* >90 mL/min Final  . GFR calc Af Amer 04/12/2014 89* >90 mL/min Final   Comment: (NOTE) The eGFR has been calculated using the CKD EPI equation. This calculation has not been validated in all clinical situations. eGFR's persistently <90 mL/min signify possible Chronic Kidney Disease.   . Anion gap 04/12/2014 8  5 - 15 Final  . WBC 04/12/2014 5.3  4.0 - 10.5 K/uL Final  . RBC 04/12/2014 4.34  3.87 - 5.11 MIL/uL Final  . Hemoglobin 04/12/2014 12.2  12.0 - 15.0 g/dL Final  . HCT 04/12/2014 37.4  36.0 - 46.0 % Final  . MCV 04/12/2014 86.2  78.0 - 100.0 fL Final  . MCH 04/12/2014 28.1  26.0 - 34.0 pg Final  . MCHC 04/12/2014 32.6  30.0 - 36.0 g/dL Final  . RDW 04/12/2014 13.8  11.5 - 15.5 % Final  . Platelets 04/12/2014 199  150 - 400 K/uL Final  . Total Protein ELP 04/14/2014 6.0  6.0 - 8.5 g/dL Final  . Albumin ELP 04/14/2014 2.9* 3.2 - 5.6 g/dL Final  . Alpha-1-Globulin 04/14/2014 0.3  0.1 - 0.4 g/dL Final  . Alpha-2-Globulin 04/14/2014 0.7  0.4 - 1.2 g/dL Final  . Beta Globulin 04/14/2014 1.0  0.6 - 1.3 g/dL Final  . Gamma Globulin 04/14/2014 1.1  0.5 - 1.6 g/dL Final  . M-Spike, % 04/14/2014 Not Observed  Not Observed g/dL Final  .  SPE Interp. 04/14/2014 Comment   Final   Comment: (NOTE) The SPE pattern reflects hypoalbuminemia. Evidence of monoclonal protein is not apparent. Performed At: [facility] [place], [place] [id number] Lindon Romp MD [id number]   . Comment 04/14/2014 Comment   Final   Comment: (NOTE) Protein electrophoresis scan will follow via computer, mail, or courier delivery.   Marland Kitchen GLOBULIN, TOTAL 04/14/2014 3.1  2.0 - 4.5 g/dL Corrected  . A/G  Ratio 04/14/2014 0.9  0.7 - 2.0 Corrected  . CRP 04/14/2014 0.7* <0.60 mg/dL Final   Performed at [facility]  . LDH 04/14/2014 121  94 - 250 U/L Final  . Beta-2 Microglobulin 04/14/2014 1.8  0.6 - 2.4 mg/L Final   Comment: (NOTE) Performed At: [facility] [place], [place] [id number] Lindon Romp MD [id number]:[id number]     Ct Hip Left Wo Contrast  04/12/2014   CLINICAL DATA:  LEFT hip pain. Fall on Christmas Eve. Pelvic fractures on prior radiographs. Initial encounter.  EXAM: CT OF THE LEFT HIP WITHOUT CONTRAST  TECHNIQUE: Multidetector CT imaging of the left hip was performed according to the standard protocol. Multiplanar CT image reconstructions were also generated.  COMPARISON:  04/11/2014.  FINDINGS: Mildly displaced LEFT obturator ring fractures are present. The root of the LEFT superior pubic ramus fracture is minimally displaced. There is no acetabular extension. Mild displacement of approximately 1 shaft width of the inferior pubic ramus is present. Both of these fractures appear acute or subacute, with crisp margins. No obturator hematoma. Mild edema is present around the obturator ring consistent with acute or subacute fracture.  Additionally, there is a LEFT sacral ala fracture. Sclerosis extends transversely across the S2 vertebra. The sclerosis suggests subacute to chronic fracture. Sclerosis is also present in the LEFT parasymphyseal pubis, suggesting healing fracture.  The LEFT femoral neck is intact. The visceral pelvis appears within normal limits.  Compared to CT 03/26/2013, there is no sclerosis in the sacrum or parasymphyseal pubis.  IMPRESSION: LEFT obturator ring fractures, with sclerosis of the LEFT parasymphyseal pubis and LEFT sacral ala that suggests acute on chronic injury. The root of the LEFT superior pubic ramus and LEFT inferior pubic ramus fractures appear subacute or acute, without sclerosis or ossified callus whereas the pubic symphysis  fracture is probably chronic and the LEFT sacral ala fracture is probably acute on chronic.   Electronically Signed   By: Dereck Ligas M.D.   On: 04/12/2014 11:56   Mr Hip Left Wo Contrast  04/13/2014   CLINICAL DATA:  Pelvic fractures. History of falls. Hip pain radiating to the LEFT thigh.  EXAM: MR OF THE LEFT HIP WITHOUT CONTRAST  TECHNIQUE: Multiplanar, multisequence MR imaging was performed. No intravenous contrast was administered.  COMPARISON:  10/13/2014.  FINDINGS: LEFT obturator ring fractures identified on prior CT are again noted. The acute appearing fractures are in the mid inferior pubic ramus on the LEFT and at the root of the LEFT superior pubic ramus. There is low level edema in the pubic bones bilaterally, suggesting healing or chronic fractures. This is congruent with findings on prior CT. Necks are visible and there is no femur fracture.  Moderate RIGHT hip osteoarthritis is present with subchondral acetabular cysts and femoral head cysts. There are bilateral sacral alae fracture is also visible on the coronal imaging. Bone marrow edema in the medial RIGHT iliac bone also suggests associated insufficiency fracture. The LEFT iliac bone appears normal. Reactive edema in the  LEFT obturator foramen and LEFT adductor compartment muscles associated with obturator ring fracture. Edema in the root of the RIGHT superior pubic ramus which may be secondary to stress reaction from instability or an older fracture of the RIGHT obturator ring. No fracture plane is visible. Partially visualized lumbar spondylosis.  IMPRESSION: 1. Negative for femoral neck fractures, which is the chief concern for today imaging. 2. Acute LEFT obturator ring fractures are unchanged compared to yesterday CT scan. 3. Nondisplaced bilateral sacral alae fractures. Medial RIGHT iliac bone insufficiency fracture. 4. Edema in the pubic symphysis bilaterally correlates with sclerosis on the prior CT and probably represents healing  fractures. Low level edema at the root of the RIGHT superior pubic ramus suggests either stress reaction or healing fracture. 5. Correlating prior studies, the findings suggest repeated falls would acute on chronic pelvic fractures.   Electronically Signed   By: Dereck Ligas M.D.   On: 04/13/2014 12:11   Dg Knee Complete 4 Views Left  04/11/2014   CLINICAL DATA:  Left knee pain following falls, initial encounter  EXAM: LEFT KNEE - COMPLETE 4+ VIEW  COMPARISON:  12/23/2011  FINDINGS: Left knee prosthesis is seen. No acute abnormality is noted. No joint effusion is seen. No fracture is noted.  IMPRESSION: Status post knee replacement.  No acute abnormality is noted.   Electronically Signed   By: Inez Catalina M.D.   On: 04/11/2014 15:31   Dg Hip Unilat With Pelvis 2-3 Views Left  04/11/2014   CLINICAL DATA:  Left hip pain the radiates down the left knee. Fall 3 months ago  EXAM: LEFT HIP (WITH PELVIS) 2-3 VIEWS  COMPARISON:  Plain film 01/23/2014  FINDINGS: There is a cortical disruption along the left superior pubic ramus consistent with fracture. Fracture appears to extend into the acetabulum.There is a double density shadow in the inferior pubic ramus on the left consists with fracture. No evidence of left femoral neck fracture.  IMPRESSION: Minimally displaced fractures of the left superior and inferior pubic rami.   Electronically Signed   By: Suzy Bouchard M.D.   On: 04/11/2014 15:31     Assessment/Plan   ICD-9-CM ICD-10-CM   1. Pubic ramus fracture, unspecified laterality, with routine healing, subsequent encounter - pain controlled with meds V54.19 S32.509D   2. Hypothyroidism, unspecified hypothyroidism type - stable; cont med 244.9 E03.9   3. Glaucoma - cont eye gtts 365.9 H40.9   4. Pain, joint, multiple sites - cont pain meds 719.49 M25.50    --PT/OT as indicated  --continue current meds as ordered  --GOAL: short term rehab and d/c home when medically appropriate. Communicated  with pt and nursing.  --will follow  Artice Bergerson S. Perlie Gold  [facility] [address] [place], [place] [address] [phone] Office (Wednesdays and Fridays 8 AM - 5 PM) [phone] Cell (Monday-Friday 8 AM - 5 PM)

## 2014-04-29 LAB — UIFE/LIGHT CHAINS/TP QN, 24-HR UR

## 2014-05-13 ENCOUNTER — Non-Acute Institutional Stay (SKILLED_NURSING_FACILITY): Payer: Medicare Other | Admitting: Adult Health

## 2014-05-13 DIAGNOSIS — E039 Hypothyroidism, unspecified: Secondary | ICD-10-CM | POA: Diagnosis not present

## 2014-05-13 DIAGNOSIS — S32502A Unspecified fracture of left pubis, initial encounter for closed fracture: Secondary | ICD-10-CM

## 2014-05-13 DIAGNOSIS — K5900 Constipation, unspecified: Secondary | ICD-10-CM

## 2014-05-13 DIAGNOSIS — K5909 Other constipation: Secondary | ICD-10-CM

## 2014-05-18 DIAGNOSIS — M6281 Muscle weakness (generalized): Secondary | ICD-10-CM

## 2014-05-18 DIAGNOSIS — R2689 Other abnormalities of gait and mobility: Secondary | ICD-10-CM

## 2014-05-18 DIAGNOSIS — H409 Unspecified glaucoma: Secondary | ICD-10-CM

## 2014-05-18 DIAGNOSIS — S32509D Unspecified fracture of unspecified pubis, subsequent encounter for fracture with routine healing: Secondary | ICD-10-CM

## 2014-05-19 ENCOUNTER — Encounter: Payer: Self-pay | Admitting: Adult Health

## 2014-05-19 DIAGNOSIS — K5909 Other constipation: Secondary | ICD-10-CM | POA: Insufficient documentation

## 2014-05-19 MED ORDER — SENNOSIDES-DOCUSATE SODIUM 8.6-50 MG PO TABS: 2.0000 | ORAL_TABLET | Freq: Every day | ORAL | Status: DC

## 2014-05-19 NOTE — Progress Notes (Signed)
Patient ID: Stephanie Donovan, female   DOB: 31-May-1927, 81 y.o.   MRN: [id number]  [facility]     Allergies  Allergen Reactions  . Aspirin     Mixed reactions  . Penicillins     Lost hearing temporarily  . Codeine Palpitations       Chief Complaint  Patient presents with  . Hospitalization Follow-up    HPI:  She has been hospitalized after being diagnosed with a pelvic fracture. She is here for short term rehab. She states that her pain is being managed; but is having constipation. She states that her goal is to return home in a couple of weeks.    Past Medical History  Diagnosis Date  . Blood transfusion without reported diagnosis   . Thyroid disease   . GI bleed   . Glaucoma     Past Surgical History  Procedure Laterality Date  . Joint replacement  11/20/2011    LTKR  . Colonoscopy      VITAL SIGNS BP 116/66 mmHg  Pulse 68  Ht 5\' 2"  (1.575 m)  Wt 143 lb (64.864 kg)  BMI 26.15 kg/m2   Outpatient Encounter Prescriptions as of 04/15/2014  Medication Sig  . calcium-vitamin D (OSCAL WITH D) 500-200 MG-UNIT per tablet Take 1 tablet by mouth every morning.  . cyclobenzaprine (FLEXERIL) 5 MG tablet Take 5 mg every 8 hours as needed   . HYDROcodone-acetaminophen (NORCO) 5-325 MG per tablet Take 1 tablet by mouth every 4 (four) hours as needed.  Marland Kitchen ibuprofen (ADVIL,MOTRIN) 200 MG tablet Take 200 mg by mouth every 6 (six) hours as needed for headache or moderate pain (pain).   Marland Kitchen latanoprost (XALATAN) 0.005 % ophthalmic solution Place 1 drop into both eyes at bedtime.  Marland Kitchen levothyroxine (SYNTHROID, LEVOTHROID) 100 MCG tablet Take 100 mcg by mouth daily before breakfast.  . Multiple Vitamin (MULTIVITAMIN WITH MINERALS) TABS Take 1 tablet by mouth every morning.  . polyethylene glycol (MIRALAX / GLYCOLAX) packet Take 17 g by mouth daily.  . timolol (TIMOPTIC) 0.5 % ophthalmic solution Place 1 drop into both eyes 2 (two) times daily.  . traMADol-acetaminophen (ULTRACET)  37.5-325 MG per tablet Take 1 tablet by mouth every 6 (six) hours as needed.     SIGNIFICANT DIAGNOSTIC EXAMS  04-11-14: left knee x-ray: Status post knee replacement.  No acute abnormality is noted.  04-11-14: pelvic and left hip x-ray: Minimally displaced fractures of the left superior and inferior pubic rami.   04-12-14: ct of left hip: LEFT obturator ring fractures, with sclerosis of the LEFT parasymphyseal pubis and LEFT sacral ala that suggests acute on chronic injury. The root of the LEFT superior pubic ramus and LEFT inferior pubic ramus fractures appear subacute or acute, without sclerosis or ossified callus whereas the pubic symphysis fracture is probably chronic and the LEFT sacral ala fracture is probably acute on chronic.  04-13-14: mri of left hip: 1. Negative for femoral neck fractures, which is the chief concern for today imaging. 2. Acute LEFT obturator ring fractures are unchanged compared to yesterday CT scan. 3. Nondisplaced bilateral sacral alae fractures. Medial RIGHT iliac bone insufficiency fracture. 4. Edema in the pubic symphysis bilaterally correlates with sclerosis on the prior CT and probably represents healing fractures. Low level edema at the root of the RIGHT superior pubic ramus suggests either stress reaction or healing fracture. 5. Correlating prior studies, the findings suggest repeated falls would acute on chronic pelvic fractures.    LABS REVIEWED:  04-16-14: wbc 6.5; hgb 12.4; hct 37.6; mcv 85.3; plt 222; glucose 76; bun 13; creat 1.15; k+3.9; na++138       Review of Systems  Constitutional: Negative for malaise/fatigue.  Respiratory: Negative for cough and shortness of breath.   Cardiovascular: Negative for chest pain, palpitations and leg swelling.  Gastrointestinal: Positive for constipation. Negative for heartburn and abdominal pain.  Musculoskeletal: Negative for myalgias and joint pain.       Her pain is presently being managed   Skin:  Negative.   Neurological: Negative for headaches.  Psychiatric/Behavioral: Negative for depression. The patient is not nervous/anxious.      Physical Exam  Constitutional: She is oriented to person, place, and time. She appears well-developed and well-nourished. No distress.  Neck: Neck supple. No JVD present. No thyromegaly present.  Cardiovascular: Normal rate, regular rhythm and intact distal pulses.   Respiratory: Effort normal and breath sounds normal. No respiratory distress.  GI: Soft. Bowel sounds are normal. She exhibits no distension. There is no tenderness.  Musculoskeletal: She exhibits no edema.  Is able to move all extremities   Neurological: She is alert and oriented to person, place, and time.  Skin: Skin is warm and dry. She is not diaphoretic.       ASSESSMENT/ PLAN:  1. Pelvic fracture: will continue therapy as directed and will follow up with orthopedics as indicated. Will continue vicodin 5/325 mg every 4 hours as needed; ultracet 37.5/325 mg every 6 hours as needed; will continue flexeril 5 mg every 8 hours as needed. Will monitor  2.  Hypothyroidism: will continue synthroid 100 mcg daily   3. Glaucoma: will continue xalatan to both eyes nightly   4. Constipation: will continue miralax 17 gm daily; and will begin senna s 2 tabs nightly    Time spent with patient 50 minutes.     Ok Edwards NP [facility]  Contact [phone] Monday through Friday 8am- 5pm  After hours call [phone]

## 2014-05-19 NOTE — Progress Notes (Signed)
Patient ID: Stephanie Donovan, female   DOB: [birth year], 81 y.o.   MRN: [id number]  [facility]     Allergies  Allergen Reactions  . Aspirin     Mixed reactions  . Penicillins     Lost hearing temporarily  . Codeine Palpitations       Chief Complaint  Patient presents with  . Discharge Note    HPI:  She is being discharged to home after her rehab for her pelvic fracture. She will need home health for pt/ot to improve upon gait; balance and adl retraining. She will need a 3:1 commode and shower bench. She will need her prescriptions to be written and will need a follow up with her pcp.    Past Medical History  Diagnosis Date  . Blood transfusion without reported diagnosis   . Thyroid disease   . GI bleed   . Glaucoma     Past Surgical History  Procedure Laterality Date  . Joint replacement  11/20/2011    LTKR  . Colonoscopy      VITAL SIGNS BP 137/84 mmHg  Pulse 98  Ht 5\' 2"  (1.575 m)  Wt 145 lb (65.772 kg)  BMI 26.51 kg/m2   Outpatient Encounter Prescriptions as of 05/13/2014  Medication Sig  . calcium-vitamin D (OSCAL WITH D) 500-200 MG-UNIT per tablet Take 1 tablet by mouth every morning.  . cyclobenzaprine (FLEXERIL) 5 MG tablet Take 1 tablet (5 mg total) by mouth 3 times/day as needed-between meals & bedtime for muscle spasms. (Patient taking differently: Take 5 mg by mouth 3 (three) times daily as needed for muscle spasms. )  . HYDROcodone-acetaminophen (NORCO) 5-325 MG per tablet Take 1 tablet by mouth every 4 (four) hours as needed.  Marland Kitchen ibuprofen (ADVIL,MOTRIN) 200 MG tablet Take 200 mg by mouth every 6 (six) hours as needed for headache or moderate pain (pain).   Marland Kitchen latanoprost (XALATAN) 0.005 % ophthalmic solution Place 1 drop into both eyes at bedtime.  Marland Kitchen levothyroxine (SYNTHROID, LEVOTHROID) 100 MCG tablet Take 100 mcg by mouth daily before breakfast.  . Multiple Vitamin (MULTIVITAMIN WITH MINERALS) TABS Take 1 tablet by mouth every morning.  .  polyethylene glycol (MIRALAX / GLYCOLAX) packet Take 17 g by mouth daily.  Marland Kitchen senna-docusate (SENOKOT-S) 8.6-50 MG per tablet Take 2 tablets by mouth at bedtime.  . timolol (TIMOPTIC) 0.5 % ophthalmic solution Place 1 drop into both eyes 2 (two) times daily.  . traMADol-acetaminophen (ULTRACET) 37.5-325 MG per tablet Take 1 tablet by mouth every 6 (six) hours as needed.     SIGNIFICANT DIAGNOSTIC EXAMS   04-11-14: left knee x-ray: Status post knee replacement.  No acute abnormality is noted.  04-11-14: pelvic and left hip x-ray: Minimally displaced fractures of the left superior and inferior pubic rami.   04-12-14: ct of left hip: LEFT obturator ring fractures, with sclerosis of the LEFT parasymphyseal pubis and LEFT sacral ala that suggests acute on chronic injury. The root of the LEFT superior pubic ramus and LEFT inferior pubic ramus fractures appear subacute or acute, without sclerosis or ossified callus whereas the pubic symphysis fracture is probably chronic and the LEFT sacral ala fracture is probably acute on chronic.  04-13-14: mri of left hip: 1. Negative for femoral neck fractures, which is the chief concern for today imaging. 2. Acute LEFT obturator ring fractures are unchanged compared to yesterday CT scan. 3. Nondisplaced bilateral sacral alae fractures. Medial RIGHT iliac bone insufficiency fracture. 4. Edema in the pubic symphysis bilaterally  correlates with sclerosis on the prior CT and probably represents healing fractures. Low level edema at the root of the RIGHT superior pubic ramus suggests either stress reaction or healing fracture. 5. Correlating prior studies, the findings suggest repeated falls would acute on chronic pelvic fractures.    LABS REVIEWED:   04-16-14: wbc 6.5; hgb 12.4; hct 37.6; mcv 85.3; plt 222; glucose 76; bun 13; creat 1.15; k+3.9; na++138       ROS Constitutional: Negative for malaise/fatigue.  Respiratory: Negative for cough and shortness of  breath.   Cardiovascular: Negative for chest pain, palpitations and leg swelling.  Gastrointestinal: Negative for heartburn and abdominal pain.  and constipation  Musculoskeletal: Negative for myalgias and joint pain.       Her pain is presently being managed   Skin: Negative.   Neurological: Negative for headaches.  Psychiatric/Behavioral: Negative for depression. The patient is not nervous/anxious.      Physical Exam Constitutional: She is oriented to person, place, and time. She appears well-developed and well-nourished. No distress.  Neck: Neck supple. No JVD present. No thyromegaly present.  Cardiovascular: Normal rate, regular rhythm and intact distal pulses.   Respiratory: Effort normal and breath sounds normal. No respiratory distress.  GI: Soft. Bowel sounds are normal. She exhibits no distension. There is no tenderness.  Musculoskeletal: She exhibits no edema.  Is able to move all extremities   Neurological: She is alert and oriented to person, place, and time.  Skin: Skin is warm and dry. She is not diaphoretic.     ASSESSMENT/ PLAN:  Will discharge to home with home health for pt/ot. Will need a 3:1 commode and a shower bench. Her prescriptions have been written for a 30 day supply with 5 refills; including flexeril 5 mg #30 tabs with 5 refills; vicodin 5/325 mg # 30 tabs; ultracet 37.5/325 mg #30 tabs with 5 refills. She has a follow up appointment with Dr. Eulas Post at [facility] on 06-09-14 at 8;45 am.   Time spent with patient 40 minutes.    Ok Edwards NP [facility]  Contact [phone] Monday through Friday 8am- 5pm  After hours call [phone]

## 2014-05-26 ENCOUNTER — Encounter: Payer: Self-pay | Admitting: Internal Medicine

## 2014-06-09 ENCOUNTER — Ambulatory Visit: Payer: Self-pay | Admitting: Internal Medicine

## 2014-08-23 DIAGNOSIS — I639 Cerebral infarction, unspecified: Secondary | ICD-10-CM

## 2014-08-23 HISTORY — DX: Cerebral infarction, unspecified: I63.9

## 2014-08-25 ENCOUNTER — Encounter (HOSPITAL_COMMUNITY): Payer: Self-pay | Admitting: Emergency Medicine

## 2014-08-25 ENCOUNTER — Observation Stay (HOSPITAL_COMMUNITY): Payer: Medicare Other

## 2014-08-25 ENCOUNTER — Emergency Department (HOSPITAL_COMMUNITY): Payer: Medicare Other

## 2014-08-25 ENCOUNTER — Inpatient Hospital Stay (HOSPITAL_COMMUNITY)
Admission: EM | Admit: 2014-08-25 | Discharge: 2014-08-26 | DRG: 064 | Disposition: A | Discharge status: Home / Self Care | Payer: Medicare Other | Attending: Internal Medicine | Admitting: Internal Medicine

## 2014-08-25 DIAGNOSIS — Z880 Allergy status to penicillin: Secondary | ICD-10-CM

## 2014-08-25 DIAGNOSIS — F419 Anxiety disorder, unspecified: Secondary | ICD-10-CM | POA: Diagnosis present

## 2014-08-25 DIAGNOSIS — E039 Hypothyroidism, unspecified: Secondary | ICD-10-CM | POA: Diagnosis present

## 2014-08-25 DIAGNOSIS — G451 Carotid artery syndrome (hemispheric): Secondary | ICD-10-CM | POA: Diagnosis not present

## 2014-08-25 DIAGNOSIS — Z96652 Presence of left artificial knee joint: Secondary | ICD-10-CM | POA: Diagnosis not present

## 2014-08-25 DIAGNOSIS — E785 Hyperlipidemia, unspecified: Secondary | ICD-10-CM | POA: Diagnosis present

## 2014-08-25 DIAGNOSIS — I4891 Unspecified atrial fibrillation: Secondary | ICD-10-CM | POA: Diagnosis not present

## 2014-08-25 DIAGNOSIS — G9340 Encephalopathy, unspecified: Secondary | ICD-10-CM | POA: Diagnosis present

## 2014-08-25 DIAGNOSIS — L282 Other prurigo: Secondary | ICD-10-CM | POA: Diagnosis not present

## 2014-08-25 DIAGNOSIS — I639 Cerebral infarction, unspecified: Principal | ICD-10-CM | POA: Diagnosis present

## 2014-08-25 DIAGNOSIS — L299 Pruritus, unspecified: Secondary | ICD-10-CM | POA: Diagnosis present

## 2014-08-25 DIAGNOSIS — Z886 Allergy status to analgesic agent status: Secondary | ICD-10-CM | POA: Diagnosis not present

## 2014-08-25 DIAGNOSIS — G459 Transient cerebral ischemic attack, unspecified: Secondary | ICD-10-CM | POA: Diagnosis present

## 2014-08-25 DIAGNOSIS — R4701 Aphasia: Secondary | ICD-10-CM | POA: Diagnosis not present

## 2014-08-25 DIAGNOSIS — Z885 Allergy status to narcotic agent status: Secondary | ICD-10-CM

## 2014-08-25 DIAGNOSIS — I10 Essential (primary) hypertension: Secondary | ICD-10-CM | POA: Diagnosis present

## 2014-08-25 DIAGNOSIS — Z79899 Other long term (current) drug therapy: Secondary | ICD-10-CM | POA: Diagnosis not present

## 2014-08-25 DIAGNOSIS — H409 Unspecified glaucoma: Secondary | ICD-10-CM | POA: Diagnosis present

## 2014-08-25 DIAGNOSIS — Z8673 Personal history of transient ischemic attack (TIA), and cerebral infarction without residual deficits: Secondary | ICD-10-CM | POA: Diagnosis present

## 2014-08-25 DIAGNOSIS — I480 Paroxysmal atrial fibrillation: Secondary | ICD-10-CM | POA: Diagnosis not present

## 2014-08-25 DIAGNOSIS — R531 Weakness: Secondary | ICD-10-CM | POA: Diagnosis present

## 2014-08-25 DIAGNOSIS — G8929 Other chronic pain: Secondary | ICD-10-CM | POA: Diagnosis not present

## 2014-08-25 DIAGNOSIS — M549 Dorsalgia, unspecified: Secondary | ICD-10-CM | POA: Diagnosis present

## 2014-08-25 HISTORY — DX: Other chronic pain: G89.29

## 2014-08-25 HISTORY — DX: Dorsalgia, unspecified: M54.9

## 2014-08-25 LAB — COMPREHENSIVE METABOLIC PANEL
ALK PHOS: 100 U/L (ref 38–126)
ALT: 16 U/L (ref 14–54)
ANION GAP: 7 (ref 5–15)
AST: 14 U/L — ABNORMAL LOW (ref 15–41)
Albumin: 3.2 g/dL — ABNORMAL LOW (ref 3.5–5.0)
BUN: 13 mg/dL (ref 6–20)
CALCIUM: 8.6 mg/dL — AB (ref 8.9–10.3)
CHLORIDE: 105 mmol/L (ref 101–111)
CO2: 28 mmol/L (ref 22–32)
Creatinine, Ser: 0.54 mg/dL (ref 0.44–1.00)
GFR calc Af Amer: >60 mL/min (ref >60)
GFR calc non Af Amer: >60 mL/min (ref >60)
GLUCOSE: 104 mg/dL — AB (ref 65–99)
Potassium: 3.6 mmol/L (ref 3.5–5.1)
Sodium: 140 mmol/L (ref 135–145)
TOTAL PROTEIN: 6.1 g/dL — AB (ref 6.5–8.1)
Total Bilirubin: 0.5 mg/dL (ref 0.3–1.2)

## 2014-08-25 LAB — DIFFERENTIAL
BASOS ABS: 0 10*3/uL (ref 0.0–0.1)
Basophils Relative: 1 % (ref 0–1)
Eosinophils Absolute: 0.2 10*3/uL (ref 0.0–0.7)
Eosinophils Relative: 2 % (ref 0–5)
Lymphocytes Relative: 23 % (ref 12–46)
Lymphs Abs: 1.7 10*3/uL (ref 0.7–4.0)
MONOS PCT: 11 % (ref 3–12)
Monocytes Absolute: 0.8 10*3/uL (ref 0.1–1.0)
NEUTROS ABS: 4.8 10*3/uL (ref 1.7–7.7)
NEUTROS PCT: 63 % (ref 43–77)

## 2014-08-25 LAB — CBC
HCT: 40.5 % (ref 36.0–46.0)
HEMATOCRIT: 39.1 % (ref 36.0–46.0)
Hemoglobin: 12.7 g/dL (ref 12.0–15.0)
Hemoglobin: 13.1 g/dL (ref 12.0–15.0)
MCH: 28.3 pg (ref 26.0–34.0)
MCH: 28.5 pg (ref 26.0–34.0)
MCHC: 32.5 g/dL (ref 30.0–36.0)
MCHC: 32.3 g/dL (ref 30.0–36.0)
MCV: 87.1 fL (ref 78.0–100.0)
MCV: 88 fL (ref 78.0–100.0)
PLATELETS: 158 10*3/uL (ref 150–400)
Platelets: 168 10*3/uL (ref 150–400)
RBC: 4.49 MIL/uL (ref 3.87–5.11)
RBC: 4.6 MIL/uL (ref 3.87–5.11)
RDW: 14.4 % (ref 11.5–15.5)
RDW: 14.6 % (ref 11.5–15.5)
WBC: 7.5 10*3/uL (ref 4.0–10.5)
WBC: 8.4 10*3/uL (ref 4.0–10.5)

## 2014-08-25 LAB — URINALYSIS, ROUTINE W REFLEX MICROSCOPIC
BILIRUBIN URINE: NEGATIVE
Glucose, UA: NEGATIVE mg/dL
Hgb urine dipstick: NEGATIVE
KETONES UR: NEGATIVE mg/dL
Leukocytes, UA: NEGATIVE
Nitrite: NEGATIVE
PH: 7.5 (ref 5.0–8.0)
Protein, ur: NEGATIVE mg/dL
Specific Gravity, Urine: 1.008 (ref 1.005–1.030)
Urobilinogen, UA: 0.2 mg/dL (ref 0.0–1.0)

## 2014-08-25 LAB — APTT: aPTT: 33 seconds (ref 24–37)

## 2014-08-25 LAB — CREATININE, SERUM
CREATININE: 0.67 mg/dL (ref 0.44–1.00)
GFR calc Af Amer: >60 mL/min (ref >60)
GFR calc non Af Amer: >60 mL/min (ref >60)

## 2014-08-25 LAB — I-STAT TROPONIN, ED: TROPONIN I, POC: 0 ng/mL (ref 0.00–0.08)

## 2014-08-25 LAB — PROTIME-INR
INR: 1.04 (ref 0.00–1.49)
Prothrombin Time: 13.8 seconds (ref 11.6–15.2)

## 2014-08-25 LAB — ETHANOL

## 2014-08-25 LAB — TSH: TSH: 0.298 u[IU]/mL — ABNORMAL LOW (ref 0.350–4.500)

## 2014-08-25 LAB — AMMONIA: Ammonia: 26 umol/L (ref 9–35)

## 2014-08-25 MED ORDER — ENOXAPARIN SODIUM 40 MG/0.4ML ~~LOC~~ SOLN
40.0000 mg | SUBCUTANEOUS | Status: DC
  Administered 2014-08-25: 40 mg via SUBCUTANEOUS
  Filled 2014-08-25 (×2): qty 0.4

## 2014-08-25 MED ORDER — LEVOTHYROXINE SODIUM 100 MCG PO TABS
100.0000 ug | ORAL_TABLET | Freq: Every day | ORAL | Status: DC
  Administered 2014-08-25 – 2014-08-26 (×2): 100 ug via ORAL
  Filled 2014-08-25 (×2): qty 1

## 2014-08-25 MED ORDER — DIPHENHYDRAMINE HCL 50 MG/ML IJ SOLN
12.5000 mg | Freq: Three times a day (TID) | INTRAMUSCULAR | Status: DC | PRN
Start: 2014-08-25 — End: 2014-08-26
  Administered 2014-08-25 (×2): 12.5 mg via INTRAVENOUS
  Filled 2014-08-25 (×2): qty 1

## 2014-08-25 MED ORDER — LATANOPROST 0.005 % OP SOLN
1.0000 [drp] | Freq: Every day | OPHTHALMIC | Status: DC
  Administered 2014-08-25: 1 [drp] via OPHTHALMIC
  Filled 2014-08-25: qty 2.5

## 2014-08-25 MED ORDER — TIMOLOL MALEATE 0.5 % OP SOLN
1.0000 [drp] | Freq: Two times a day (BID) | OPHTHALMIC | Status: DC
  Administered 2014-08-25 – 2014-08-26 (×3): 1 [drp] via OPHTHALMIC
  Filled 2014-08-25: qty 5

## 2014-08-25 NOTE — ED Notes (Signed)
Pt arrived to ED via EMS c/o weakness x 15 mins and generalized itching "inside her body".  She takes neurontin on a regular basis but is unsure of the indication for the medication.  EMS states that she is alert and oriented and ambulatory at baseline.  Hx includes chronic back pain and diabetes according to EMS report.  Pt denies pain.  Blood pressure en route was initially 160/120 but after patient calmed down it dropped to WNL per EMS.  CBG 93 en route, oxygen 98% when sitting up (dropped to 92% when lying flat), pulse mid-80's throughout transport.  Pt responds appropriately and does not appear to be in acute distress at this time.

## 2014-08-25 NOTE — ED Notes (Signed)
Patient c/o sudden onset itching "inside her body". Patient states it was so severe that she was awakened from sleep. Patient states she tried taking a cool shower but it did not help. Patient states the itching was over her entire body. Patient states the itching is resolved. Patient lives at home alone, denies any new medications, but did take a dose of gabapentin prior to going to sleep, patient states she does not take this medication every day.

## 2014-08-25 NOTE — Progress Notes (Signed)
Pt arrived from [facility] ED.  C/O itching but no other complaints.  Alert and oriented.  Oriented to room and need to call for assistance before getting out of bed.  Call bell within reach and bed alarm set.  Dr. Wendee Beavers notified of patients arrival to floor.  Tele placed and CCMD called.  Will continue to monitor.

## 2014-08-25 NOTE — Consult Note (Signed)
Referring Physician: [facility]    Chief Complaint: TIA  HPI:                                                                                                                                         Stephanie Donovan is an 81 y.o. female who awoke last night with intense itching.  She has had problems with pruritis for the last three months and her PCP and dermatologist have not been able to find a etiology.  Patient took a cool shower and after her shower she noted "her head felt very heavy like a ton of bricks, no HA, no dizziness, blurred vision, sensation of fainting".  She sat down and called EMS.  She states she "maybe" had some difficulty getting her words out but she feels that was because her head was heavy. On arrival to ED she felt fine. Currently her only complaint is itching.   She is supposed to be on a baby ASA daily but does not take it.   Date last known well: Date: 08/25/2014 Time last known well: Unable to determine tPA Given: No: symptoms resolved Modified Rankin: Rankin Score=0    Past Medical History  Diagnosis Date  . Blood transfusion without reported diagnosis   . Thyroid disease   . GI bleed   . Glaucoma   . Chronic back pain     Past Surgical History  Procedure Laterality Date  . Joint replacement  11/20/2011    LTKR  . Colonoscopy      Family History  Problem Relation Age of Onset  . Osteoporosis Mother   . Cancer - Colon Father   . Schizophrenia Son    Social History:  reports that she has never smoked. She has never used smokeless tobacco. She reports that she does not drink alcohol or use illicit drugs.  Allergies:  Allergies  Allergen Reactions  . Aspirin     Mixed reactions  . Penicillins     Lost hearing temporarily  . Codeine Palpitations    Medications:                                                                                                                           Prior to Admission:  Prescriptions prior to  admission  Medication Sig Dispense Refill Last Dose  . calcium-vitamin D (OSCAL WITH D)  500-200 MG-UNIT per tablet Take 1 tablet by mouth every morning.   08/24/2014 at Unknown time  . gabapentin (NEURONTIN) 300 MG capsule Take 300 mg by mouth at bedtime.   08/24/2014 at Unknown time  . ibuprofen (ADVIL,MOTRIN) 200 MG tablet Take 200 mg by mouth every 6 (six) hours as needed for headache or moderate pain (pain).    Past Month at Unknown time  . latanoprost (XALATAN) 0.005 % ophthalmic solution Place 1 drop into both eyes at bedtime.   08/24/2014 at Unknown time  . levothyroxine (SYNTHROID, LEVOTHROID) 100 MCG tablet Take 100 mcg by mouth daily before breakfast.   08/24/2014 at Unknown time  . Multiple Vitamin (MULTIVITAMIN WITH MINERALS) TABS Take 1 tablet by mouth every morning.   08/24/2014 at Unknown time  . timolol (TIMOPTIC) 0.5 % ophthalmic solution Place 1 drop into both eyes 2 (two) times daily.   08/24/2014 at Unknown time  . cyclobenzaprine (FLEXERIL) 5 MG tablet Take 1 tablet (5 mg total) by mouth 3 times/day as needed-between meals & bedtime for muscle spasms. (Patient not taking: Reported on 08/25/2014) 30 tablet 0 Not Taking at Unknown time  . HYDROcodone-acetaminophen (NORCO) 5-325 MG per tablet Take 1 tablet by mouth every 4 (four) hours as needed. (Patient not taking: Reported on 08/25/2014) 20 tablet 0 Not Taking at Unknown time  . polyethylene glycol (MIRALAX / GLYCOLAX) packet Take 17 g by mouth daily. (Patient not taking: Reported on 08/25/2014) 14 each 0 Not Taking at Unknown time  . senna-docusate (SENOKOT-S) 8.6-50 MG per tablet Take 2 tablets by mouth at bedtime. (Patient not taking: Reported on 08/25/2014) 120 tablet 11 Not Taking at Unknown time  . traMADol-acetaminophen (ULTRACET) 37.5-325 MG per tablet Take 1 tablet by mouth every 6 (six) hours as needed. (Patient not taking: Reported on 08/25/2014) 30 tablet 0 Not Taking at Unknown time   Scheduled: . enoxaparin (LOVENOX) injection  40 mg  Subcutaneous Q24H  . latanoprost  1 drop Both Eyes QHS  . levothyroxine  100 mcg Oral QAC breakfast  . timolol  1 drop Both Eyes BID    ROS:                                                                                                                                       History obtained from the patient  General ROS: negative for - chills, fatigue, fever, night sweats, weight gain or weight loss Psychological ROS: negative for - behavioral disorder, hallucinations, memory difficulties, mood swings or suicidal ideation Ophthalmic ROS: negative for - blurry vision, double vision, eye pain or loss of vision ENT ROS: negative for - epistaxis, nasal discharge, oral lesions, sore throat, tinnitus or vertigo Allergy and Immunology ROS: negative for - hives or itchy/watery eyes Hematological and Lymphatic ROS: negative for - bleeding problems, bruising or swollen lymph nodes Endocrine ROS: negative for - galactorrhea, hair pattern changes, polydipsia/polyuria or  temperature intolerance Respiratory ROS: negative for - cough, hemoptysis, shortness of breath or wheezing Cardiovascular ROS: negative for - chest pain, dyspnea on exertion, edema or irregular heartbeat Gastrointestinal ROS: negative for - abdominal pain, diarrhea, hematemesis, nausea/vomiting or stool incontinence Genito-Urinary ROS: negative for - dysuria, hematuria, incontinence or urinary frequency/urgency Musculoskeletal ROS: negative for - joint swelling or muscular weakness Neurological ROS: as noted in HPI Dermatological ROS: negative for rash and skin lesion changes  Neurologic Examination:                                                                                                      Blood pressure 124/73, pulse 78, temperature 97.6 F (36.4 C), resp. rate 23, SpO2 100 %.  HEENT-  Normocephalic, no lesions, without obvious abnormality.  Normal external eye and conjunctiva.  Normal TM's bilaterally.  Normal auditory  canals and external ears. Normal external nose, mucus membranes and septum.  Normal pharynx. Cardiovascular- S1, S2 normal, pulses palpable throughout   Lungs- chest clear, no wheezing, rales, normal symmetric air entry Abdomen- normal findings: bowel sounds normal Extremities- no edema Lymph-no adenopathy palpable Musculoskeletal-no joint tenderness, deformity or swelling Skin-warm and dry, no hyperpigmentation, vitiligo, or suspicious lesions  Neurological Examination Mental Status: Alert, oriented, thought content appropriate.  Speech fluent without evidence of aphasia.  Able to follow 3 step commands without difficulty. Cranial Nerves: II: Discs flat bilaterally; Visual fields grossly normal, pupils equal, round, reactive to light and accommodation III,IV, VI: ptosis not present, extra-ocular motions intact bilaterally V,VII: smile symmetric, facial light touch sensation normal bilaterally VIII: hearing normal bilaterally IX,X: uvula rises symmetrically XI: bilateral shoulder shrug XII: midline tongue extension Motor: Right : Upper extremity   5/5    Left:     Upper extremity   5/5  Lower extremity   5/5     Lower extremity   5/5 Tone and bulk:normal tone throughout; no atrophy noted Sensory: Pinprick and light touch intact throughout, bilaterally Deep Tendon Reflexes: 2+ and symmetric throughout Plantars: Right: downgoing   Left: downgoing Cerebellar: normal finger-to-nose, normal rapid alternating movements and normal heel-to-shin test Gait: normal gait and station       Lab Results: Basic Metabolic Panel:  Recent Labs Lab 08/25/14 0504  NA 140  K 3.6  CL 105  CO2 28  GLUCOSE 104*  BUN 13  CREATININE 0.54  CALCIUM 8.6*    Liver Function Tests:  Recent Labs Lab 08/25/14 0504  AST 14*  ALT 16  ALKPHOS 100  BILITOT 0.5  PROT 6.1*  ALBUMIN 3.2*   No results for input(s): LIPASE, AMYLASE in the last 168 hours. No results for input(s): AMMONIA in the  last 168 hours.  CBC:  Recent Labs Lab 08/25/14 0504  WBC 7.5  NEUTROABS 4.8  HGB 12.7  HCT 39.1  MCV 87.1  PLT 158    Cardiac Enzymes: No results for input(s): CKTOTAL, CKMB, CKMBINDEX, TROPONINI in the last 168 hours.  Lipid Panel: No results for input(s): CHOL, TRIG, HDL, CHOLHDL, VLDL, LDLCALC in the last 168 hours.  CBG: No  results for input(s): GLUCAP in the last 168 hours.  Microbiology: Results for orders placed or performed during the hospital encounter of 03/24/13  Clostridium Difficile by PCR     Status: None   Collection Time: 03/25/13  4:22 AM  Result Value Ref Range Status   Toxigenic C Difficile by pcr NEGATIVE NEGATIVE Final    Comment: Performed at [facility]    Coagulation Studies:  Recent Labs  08/25/14 0504  LABPROT 13.8  INR 1.04    Imaging: Ct Head Wo Contrast  08/25/2014   CLINICAL DATA:  Weakness and pruritus  EXAM: CT HEAD WITHOUT CONTRAST  TECHNIQUE: Contiguous axial images were obtained from the base of the skull through the vertex without intravenous contrast.  COMPARISON:  01/23/2014  FINDINGS: There is no intracranial hemorrhage, mass or evidence of acute infarction. There is mild generalized atrophy. There is mild chronic microvascular ischemic change. There is no significant extra-axial fluid collection.  No acute intracranial findings are evident. No bony abnormalities are evident. The visible paranasal sinuses are clear  IMPRESSION: Mild generalized atrophy and chronic microvascular disease. No acute findings.   Electronically Signed   By: Andreas Newport M.D.   On: 08/25/2014 05:45       Assessment and plan discussed with with attending physician and they are in agreement.    Etta Quill PA-C [facility] [phone]  08/25/2014, 10:05 AM   Assessment: 81 y.o. female with transient sensation of feeling fullness in her head and possible expressive difficulties.  Currently her symptoms have fully resolved.   Cannot rule out TIA/CVA.   Stroke Risk Factors - none  Recommend: 1. HgbA1c, fasting lipid panel 2. MRI, MRA  of the brain without contrast 3. PT consult, OT consult, Speech consult 4. Echocardiogram 5. Carotid dopplers 6. Prophylactic therapy-Antiplatelet med: Aspirin - dose 81 mg daily 7. Risk factor modification 8. Telemetry monitoring 9. Frequent neuro checks 10 NPO until passes stroke swallow screen    Jim Like, DO [facility] [phone]  If 7pm- 7am, please page neurology on call as listed in [place].

## 2014-08-25 NOTE — ED Notes (Signed)
Bed: [facility] Expected date:  Expected time:  Means of arrival:  Comments: ems 

## 2014-08-25 NOTE — ED Notes (Signed)
CT notified patient is ready for imaging 

## 2014-08-25 NOTE — H&P (Signed)
History and Physical  Stephanie Donovan IEP:[id number] DOB: 07/21/1927 DOA: 08/25/2014  Referring physician: EDP PCP: [name], MD   Chief Complaint: aphasia  HPI: Stephanie Donovan is a 81 y.o. female  Patient with a history of thyroid disease presents describing an episode of intense confusion and difficulty speaking without visual impairment, nausea, lateralizing weakness. She reports head pressure that is generalized. Symptoms started after waking because of itching "all over" that was "more than she could stand". She has been treated for pruritis for the past 3 months. She got up and took a cool shower which did not help the itching. She went downstairs and she had sudden onset of symptoms that included head pressure, aphasia ("I couldn't speak") and confusion, prompting a call to EMS and evaluation in the emergency department. She states the symptoms have predominantly subsided with the exception of the pressure feeling in her head which is better but persists. No history of stroke or TIA in the past.   ED course, CT head, EKG, basic labs unremarkable, patient reported symptom has subsided. ED consulted neurology, and hospitalist asked to admit the patient.  Patient reported has been having pruritis for the last 29months was evaluated by an allergy specialist, but all the test were negative. She reported head heaviness, difficulty finding words this am, but still able to call the ambulance. She denies confusion when i asked her. She denies chest pain, no sob,no dizziness, no seizure like activity,no fever,no n/v,no edema.   Review of Systems:  Detail per HPI, Review of systems are otherwise negative  Past Medical History  Diagnosis Date  . Blood transfusion without reported diagnosis   . Thyroid disease   . GI bleed   . Glaucoma   . Chronic back pain    Past Surgical History  Procedure Laterality Date  . Joint replacement  11/20/2011    LTKR  . Colonoscopy     Social  History:  reports that she has never smoked. She has never used smokeless tobacco. She reports that she does not drink alcohol or use illicit drugs. Patient lives at home by herself & is able to participate in activities of daily living independently   Allergies  Allergen Reactions  . Aspirin     Mixed reactions  . Penicillins     Lost hearing temporarily  . Codeine Palpitations    Family History  Problem Relation Age of Onset  . Osteoporosis Mother   . Cancer - Colon Father   . Schizophrenia Son       Prior to Admission medications   Medication Sig Start Date End Date Taking? Authorizing Provider  calcium-vitamin D (OSCAL WITH D) 500-200 MG-UNIT per tablet Take 1 tablet by mouth every morning.   Yes Historical Provider, MD  GABAPENTIN PO Take 1 capsule by mouth daily.   Yes Historical Provider, MD  ibuprofen (ADVIL,MOTRIN) 200 MG tablet Take 200 mg by mouth every 6 (six) hours as needed for headache or moderate pain (pain).    Yes Historical Provider, MD  latanoprost (XALATAN) 0.005 % ophthalmic solution Place 1 drop into both eyes at bedtime.   Yes Historical Provider, MD  levothyroxine (SYNTHROID, LEVOTHROID) 100 MCG tablet Take 100 mcg by mouth daily before breakfast.   Yes Historical Provider, MD  Multiple Vitamin (MULTIVITAMIN WITH MINERALS) TABS Take 1 tablet by mouth every morning.   Yes Historical Provider, MD  timolol (TIMOPTIC) 0.5 % ophthalmic solution Place 1 drop into both eyes 2 (two) times daily.  Yes Historical Provider, MD  cyclobenzaprine (FLEXERIL) 5 MG tablet Take 1 tablet (5 mg total) by mouth 3 times/day as needed-between meals & bedtime for muscle spasms. Patient taking differently: Take 5 mg by mouth 3 (three) times daily as needed for muscle spasms.  04/14/14   Nishant Dhungel, MD  HYDROcodone-acetaminophen (NORCO) 5-325 MG per tablet Take 1 tablet by mouth every 4 (four) hours as needed. Patient not taking: Reported on 08/25/2014 04/14/14   Nishant Dhungel, MD    polyethylene glycol (MIRALAX / GLYCOLAX) packet Take 17 g by mouth daily. Patient not taking: Reported on 08/25/2014 04/14/14   Nishant Dhungel, MD  senna-docusate (SENOKOT-S) 8.6-50 MG per tablet Take 2 tablets by mouth at bedtime. Patient not taking: Reported on 08/25/2014 05/19/14   Gerlene Fee, NP  traMADol-acetaminophen (ULTRACET) 37.5-325 MG per tablet Take 1 tablet by mouth every 6 (six) hours as needed. Patient not taking: Reported on 08/25/2014 04/14/14   Louellen Molder, MD    Physical Exam: BP 127/81 mmHg  Pulse 76  Temp(Src) 97.6 F (36.4 C)  Resp 24  SpO2 81%  General:  NAD Eyes: PERRL ENT: unremarkable Neck: supple, no JVD Cardiovascular: RRR Respiratory: CTABL Abdomen: soft/ND/ND, positive bowel sounds Skin: mild scatterred erythematous rash Musculoskeletal:  No edema Psychiatric: calm/cooperative Neurologic: aaox3, no focal findings            Labs on Admission:  Basic Metabolic Panel:  Recent Labs Lab 08/25/14 0504  NA 140  K 3.6  CL 105  CO2 28  GLUCOSE 104*  BUN 13  CREATININE 0.54  CALCIUM 8.6*   Liver Function Tests:  Recent Labs Lab 08/25/14 0504  AST 14*  ALT 16  ALKPHOS 100  BILITOT 0.5  PROT 6.1*  ALBUMIN 3.2*   No results for input(s): LIPASE, AMYLASE in the last 168 hours. No results for input(s): AMMONIA in the last 168 hours. CBC:  Recent Labs Lab 08/25/14 0504  WBC 7.5  NEUTROABS 4.8  HGB 12.7  HCT 39.1  MCV 87.1  PLT 158   Cardiac Enzymes: No results for input(s): CKTOTAL, CKMB, CKMBINDEX, TROPONINI in the last 168 hours.  BNP (last 3 results) No results for input(s): BNP in the last 8760 hours.  ProBNP (last 3 results) No results for input(s): PROBNP in the last 8760 hours.  CBG: No results for input(s): GLUCAP in the last 168 hours.  Radiological Exams on Admission: Ct Head Wo Contrast  08/25/2014   CLINICAL DATA:  Weakness and pruritus  EXAM: CT HEAD WITHOUT CONTRAST  TECHNIQUE: Contiguous axial images  were obtained from the base of the skull through the vertex without intravenous contrast.  COMPARISON:  01/23/2014  FINDINGS: There is no intracranial hemorrhage, mass or evidence of acute infarction. There is mild generalized atrophy. There is mild chronic microvascular ischemic change. There is no significant extra-axial fluid collection.  No acute intracranial findings are evident. No bony abnormalities are evident. The visible paranasal sinuses are clear  IMPRESSION: Mild generalized atrophy and chronic microvascular disease. No acute findings.   Electronically Signed   By: Andreas Newport M.D.   On: 08/25/2014 05:45    EKG: Independently reviewed. Sinus rhythm,no acute st/t changes  Assessment/Plan Present on Admission:  . TIA (transient ischemic attack) . Aphasia  Aphasia: symptom resolved, TIA work up, admit to tele, neurology consulted.  H/o afib: per chart review, she was admitted in 03/2013 due to afib/rvr which was later converted  Sinus rhythm, she was discharged and suppose to  follow up with Dr. Einar Gip,. But she did not follow up. She reported she is allergic to aspirin,  But not able to provide details.  Pruritis: prn benadryl  Hypothyroidism: continue synthroid     DVT prophylaxis: lovenox  Consultants: neurology   Code Status: full   Family Communication:  Patient   Disposition Plan: admit to [place] med tele  Time spent: 58mins  Kashmere Daywalt MD, PhD [facility] Pager [phone] If 7PM-7AM, please contact night-coverage at [email or web address], password [facility]

## 2014-08-25 NOTE — ED Provider Notes (Signed)
CSN: [id number]     Arrival date & time 08/25/14  0236 History   First MD Initiated Contact with Patient 08/25/14 0310     Chief Complaint  Patient presents with  . Pruritis  . Weakness     (Consider location/radiation/quality/duration/timing/severity/associated sxs/prior Treatment) HPI Comments: Patient with a history of thyroid disease presents describing an episode of intense confusion and difficulty speaking without visual impairment, nausea, lateralizing weakness. She reports head pressure that is generalized. Symptoms started after waking because of itching "all over" that was "more than she could stand". She has been treated for pruritis for the past 3 months. She got up and took a cool shower which did not help the itching. She went downstairs and she had sudden onset of symptoms that included head pressure, aphasia ("I couldn't speak")  and confusion, prompting a call to EMS and evaluation in the emergency department. She states the symptoms have predominantly subsided with the exception of the pressure feeling in her head which is better but persists. No history of stroke or TIA in the past.   Patient is a 81 y.o. female presenting with weakness. The history is provided by the patient. No language interpreter was used.  Weakness This is a new problem. The current episode started today. The problem has been gradually improving. Associated symptoms include headaches and weakness. Pertinent negatives include no chills or fever.    Past Medical History  Diagnosis Date  . Blood transfusion without reported diagnosis   . Thyroid disease   . GI bleed   . Glaucoma   . Chronic back pain    Past Surgical History  Procedure Laterality Date  . Joint replacement  11/20/2011    LTKR  . Colonoscopy     Family History  Problem Relation Age of Onset  . Osteoporosis Mother   . Cancer - Colon Father   . Schizophrenia Son    History  Substance Use Topics  . Smoking status: Never Smoker    . Smokeless tobacco: Never Used  . Alcohol Use: No   OB History    No data available     Review of Systems  Constitutional: Negative for fever and chills.  Respiratory: Negative.   Cardiovascular: Negative.   Gastrointestinal: Negative.   Genitourinary: Negative.   Musculoskeletal: Negative.   Skin: Negative.   Neurological: Positive for speech difficulty, weakness, light-headedness and headaches. Negative for syncope.  Psychiatric/Behavioral: Positive for confusion.      Allergies  Aspirin; Penicillins; and Codeine  Home Medications   Prior to Admission medications   Medication Sig Start Date End Date Taking? Authorizing Provider  calcium-vitamin D (OSCAL WITH D) 500-200 MG-UNIT per tablet Take 1 tablet by mouth every morning.   Yes Historical Provider, MD  GABAPENTIN PO Take 1 capsule by mouth daily.   Yes Historical Provider, MD  ibuprofen (ADVIL,MOTRIN) 200 MG tablet Take 200 mg by mouth every 6 (six) hours as needed for headache or moderate pain (pain).    Yes Historical Provider, MD  latanoprost (XALATAN) 0.005 % ophthalmic solution Place 1 drop into both eyes at bedtime.   Yes Historical Provider, MD  levothyroxine (SYNTHROID, LEVOTHROID) 100 MCG tablet Take 100 mcg by mouth daily before breakfast.   Yes Historical Provider, MD  Multiple Vitamin (MULTIVITAMIN WITH MINERALS) TABS Take 1 tablet by mouth every morning.   Yes Historical Provider, MD  timolol (TIMOPTIC) 0.5 % ophthalmic solution Place 1 drop into both eyes 2 (two) times daily.   Yes Historical  Provider, MD  cyclobenzaprine (FLEXERIL) 5 MG tablet Take 1 tablet (5 mg total) by mouth 3 times/day as needed-between meals & bedtime for muscle spasms. Patient taking differently: Take 5 mg by mouth 3 (three) times daily as needed for muscle spasms.  04/14/14   Nishant Dhungel, MD  HYDROcodone-acetaminophen (NORCO) 5-325 MG per tablet Take 1 tablet by mouth every 4 (four) hours as needed. Patient not taking: Reported  on 08/25/2014 04/14/14   Nishant Dhungel, MD  polyethylene glycol (MIRALAX / GLYCOLAX) packet Take 17 g by mouth daily. Patient not taking: Reported on 08/25/2014 04/14/14   Nishant Dhungel, MD  senna-docusate (SENOKOT-S) 8.6-50 MG per tablet Take 2 tablets by mouth at bedtime. Patient not taking: Reported on 08/25/2014 05/19/14   Gerlene Fee, NP  traMADol-acetaminophen (ULTRACET) 37.5-325 MG per tablet Take 1 tablet by mouth every 6 (six) hours as needed. Patient not taking: Reported on 08/25/2014 04/14/14   Nishant Dhungel, MD   BP 164/93 mmHg  Pulse 78  Temp(Src) 97.6 F (36.4 C)  Resp 21  SpO2 92% Physical Exam  Constitutional: She is oriented to person, place, and time. She appears well-developed and well-nourished. No distress.  HENT:  Head: Normocephalic and atraumatic.  Eyes: Conjunctivae are normal.  Neck: Normal range of motion. Neck supple.  Cardiovascular: Normal rate and regular rhythm.   Pulmonary/Chest: Effort normal and breath sounds normal. She has no wheezes. She has no rales.  Abdominal: Soft. Bowel sounds are normal. There is no tenderness. There is no rebound and no guarding.  Musculoskeletal: Normal range of motion. She exhibits no edema.  Neurological: She is alert and oriented to person, place, and time.  Speech is clear and focused. CN's 3-12 grossly intact. Full ROM all extremities with full strength. No deficits of coordination. No facial asymmetry.   Skin: Skin is warm and dry. No rash noted. No erythema.  Psychiatric: She has a normal mood and affect.    ED Course  Procedures (including critical care time) Labs Review Labs Reviewed  ETHANOL  PROTIME-INR  APTT  CBC  DIFFERENTIAL  COMPREHENSIVE METABOLIC PANEL  URINALYSIS, ROUTINE W REFLEX MICROSCOPIC (NOT AT [facility])  I-STAT TROPOININ, ED    Imaging Review No results found.   EKG Interpretation None      MDM   Final diagnoses:  None    1. TIA 2. pruritis  She has a normal neurologic exam  in the emergency department and appears comfortable. Difficult to discern whether her symptoms were related to her episode of intense itching but the patient states she does not feel it was related. Concern for neurologic event and, given improvement, TIA. Will plan to admit for further evaluation.   Discussed with Dr. Alexis Goodell who will provide consultation. [facility] p[facility] for admission.     Charlann Lange, PA-C 08/25/14 [place], PA-C 08/25/14 [place], MD 08/27/14 [phone]

## 2014-08-25 NOTE — ED Notes (Signed)
PA at bedside.

## 2014-08-26 ENCOUNTER — Ambulatory Visit (HOSPITAL_BASED_OUTPATIENT_CLINIC_OR_DEPARTMENT_OTHER): Payer: Medicare Other

## 2014-08-26 ENCOUNTER — Encounter (HOSPITAL_COMMUNITY): Payer: Self-pay | Admitting: *Deleted

## 2014-08-26 DIAGNOSIS — E785 Hyperlipidemia, unspecified: Secondary | ICD-10-CM | POA: Diagnosis not present

## 2014-08-26 DIAGNOSIS — R4701 Aphasia: Secondary | ICD-10-CM

## 2014-08-26 DIAGNOSIS — G459 Transient cerebral ischemic attack, unspecified: Secondary | ICD-10-CM | POA: Diagnosis not present

## 2014-08-26 DIAGNOSIS — I674 Hypertensive encephalopathy: Secondary | ICD-10-CM

## 2014-08-26 DIAGNOSIS — R531 Weakness: Secondary | ICD-10-CM | POA: Diagnosis not present

## 2014-08-26 DIAGNOSIS — I639 Cerebral infarction, unspecified: Secondary | ICD-10-CM | POA: Diagnosis not present

## 2014-08-26 DIAGNOSIS — L299 Pruritus, unspecified: Secondary | ICD-10-CM | POA: Diagnosis not present

## 2014-08-26 LAB — CBC
HCT: 38 % (ref 36.0–46.0)
HEMOGLOBIN: 12.2 g/dL (ref 12.0–15.0)
MCH: 27.7 pg (ref 26.0–34.0)
MCHC: 32.1 g/dL (ref 30.0–36.0)
MCV: 86.4 fL (ref 78.0–100.0)
Platelets: 152 10*3/uL (ref 150–400)
RBC: 4.4 MIL/uL (ref 3.87–5.11)
RDW: 14.7 % (ref 11.5–15.5)
WBC: 7.7 10*3/uL (ref 4.0–10.5)

## 2014-08-26 LAB — COMPREHENSIVE METABOLIC PANEL
ALBUMIN: 2.9 g/dL — AB (ref 3.5–5.0)
ALK PHOS: 88 U/L (ref 38–126)
ALT: 15 U/L (ref 14–54)
ANION GAP: 4 — AB (ref 5–15)
AST: 16 U/L (ref 15–41)
BILIRUBIN TOTAL: 0.5 mg/dL (ref 0.3–1.2)
BUN: 13 mg/dL (ref 6–20)
CHLORIDE: 102 mmol/L (ref 101–111)
CO2: 32 mmol/L (ref 22–32)
Calcium: 8.6 mg/dL — ABNORMAL LOW (ref 8.9–10.3)
Creatinine, Ser: 0.65 mg/dL (ref 0.44–1.00)
GFR calc Af Amer: >60 mL/min (ref >60)
Glucose, Bld: 96 mg/dL (ref 65–99)
Potassium: 3.9 mmol/L (ref 3.5–5.1)
Sodium: 138 mmol/L (ref 135–145)
TOTAL PROTEIN: 6.1 g/dL — AB (ref 6.5–8.1)

## 2014-08-26 LAB — LIPID PANEL
CHOLESTEROL: 170 mg/dL (ref 0–200)
HDL: 57 mg/dL (ref >40)
LDL Cholesterol: 99 mg/dL (ref 0–99)
Total CHOL/HDL Ratio: 3 RATIO
Triglycerides: 71 mg/dL (ref <150)
VLDL: 14 mg/dL (ref 0–40)

## 2014-08-26 LAB — PROTIME-INR
INR: 1.1 (ref 0.00–1.49)
Prothrombin Time: 14.4 seconds (ref 11.6–15.2)

## 2014-08-26 MED ORDER — ATORVASTATIN CALCIUM 20 MG PO TABS: 20.0000 mg | ORAL_TABLET | Freq: Every day | ORAL | Status: DC

## 2014-08-26 MED ORDER — CLOPIDOGREL BISULFATE 75 MG PO TABS: 75.0000 mg | ORAL_TABLET | Freq: Every day | ORAL | Status: DC

## 2014-08-26 MED ORDER — DIPHENHYDRAMINE HCL 25 MG PO CAPS
25.0000 mg | ORAL_CAPSULE | Freq: Three times a day (TID) | ORAL | Status: DC | PRN
  Administered 2014-08-26: 25 mg via ORAL
  Filled 2014-08-26: qty 1

## 2014-08-26 MED ORDER — CLOPIDOGREL BISULFATE 75 MG PO TABS
75.0000 mg | ORAL_TABLET | Freq: Every day | ORAL | Status: DC
  Administered 2014-08-26: 75 mg via ORAL
  Filled 2014-08-26: qty 1

## 2014-08-26 NOTE — Progress Notes (Signed)
STROKE TEAM PROGRESS NOTE   HISTORY Stephanie Donovan is an 81 y.o. female who awoke last night with intense itching.She has had problems with pruritis for the last three months and her PCP and dermatologist have not been able to find a etiology. Patient took a cool shower and after her shower she noted "her head felt very heavy like a ton of bricks, no HA, no dizziness, blurred vision, sensation of fainting". She sat down and called EMS. She states she "maybe" had some difficulty getting her words out but she feels that was because her head was heavy. On arrival to ED she felt fine. Currently her only complaint is itching.   She is supposed to be on a baby ASA daily but does not take it.   Date last known well: Date: 08/25/2014 Time last known well: Unable to determine tPA Given: No: symptoms resolved Modified Rankin: Rankin Score=0   SUBJECTIVE (INTERVAL HISTORY) No family members present. The patient feels back to baseline; however, she continues to have itching. She has an outpatient appointment to see a dermatologist. She has already been to see an allergist. She stated that the day prior to admission, she had intense itching and then had shower and then felt head was heavy and difficult speaking and called EMS. She does not have a history of hypertension but states that her blood pressure was elevated when evaluated by EMS. Symptoms gradually resolved.    OBJECTIVE Temp:  [97.7 F (36.5 C)-98.4 F (36.9 C)] 97.9 F (36.6 C) (08/04 1049) Pulse Rate:  [73-88] 73 (08/04 1049) Cardiac Rhythm:  [-] Normal sinus rhythm (08/04 0850) Resp:  [16-20] 20 (08/04 1049) BP: (94-124)/(52-97) 108/57 mmHg (08/04 1049) SpO2:  [95 %-98 %] 96 % (08/04 1049) Weight:  [63.504 kg (140 lb)] 63.504 kg (140 lb) (08/04 1000)  No results for input(s): GLUCAP in the last 168 hours.  Recent Labs Lab 08/25/14 0504 08/25/14 1117 08/26/14 0453  NA 140  --  138  K 3.6  --  3.9  CL 105  --  102  CO2  28  --  32  GLUCOSE 104*  --  96  BUN 13  --  13  CREATININE 0.54 0.67 0.65  CALCIUM 8.6*  --  8.6*    Recent Labs Lab 08/25/14 0504 08/26/14 0453  AST 14* 16  ALT 16 15  ALKPHOS 100 88  BILITOT 0.5 0.5  PROT 6.1* 6.1*  ALBUMIN 3.2* 2.9*    Recent Labs Lab 08/25/14 0504 08/25/14 1117 08/26/14 0453  WBC 7.5 8.4 7.7  NEUTROABS 4.8  --   --   HGB 12.7 13.1 12.2  HCT 39.1 40.5 38.0  MCV 87.1 88.0 86.4  PLT 158 168 152   No results for input(s): CKTOTAL, CKMB, CKMBINDEX, TROPONINI in the last 168 hours.  Recent Labs  08/25/14 0504 08/26/14 0453  LABPROT 13.8 14.4  INR 1.04 1.10    Recent Labs  08/25/14 0454  COLORURINE YELLOW  LABSPEC 1.008  PHURINE 7.5  GLUCOSEU NEGATIVE  HGBUR NEGATIVE  BILIRUBINUR NEGATIVE  KETONESUR NEGATIVE  PROTEINUR NEGATIVE  UROBILINOGEN 0.2  NITRITE NEGATIVE  LEUKOCYTESUR NEGATIVE       Component Value Date/Time   CHOL 170 08/26/2014 0453   TRIG 71 08/26/2014 0453   HDL 57 08/26/2014 0453   CHOLHDL 3.0 08/26/2014 0453   VLDL 14 08/26/2014 0453   LDLCALC 99 08/26/2014 0453   No results found for: HGBA1C No results found for: LABOPIA, COCAINSCRNUR, LABBENZ,  AMPHETMU, Ainsley Spinner   Recent Labs Lab 08/25/14 0504  ETH <5    Imaging    Ct Head Wo Contrast 08/25/2014    Mild generalized atrophy and chronic microvascular disease. No acute findings.     Mr Jodene Nam Head Wo Contrast 08/25/2014    Punctate acute infarction at the right posterior frontal vertex. No other acute finding.   Moderate chronic small vessel ischemic changes elsewhere throughout the brain as outlined above.  Normal intracranial MR angiography of the large and medium size vessels.    CUS - Bilateral: 1-39% ICA stenosis. Vertebral artery flow is antegrade.  2D echo - - Left ventricle: The cavity size was normal. Systolic function was normal. The estimated ejection fraction was in the range of 55% to 60%. Wall motion was normal; there were no  regional wall motion abnormalities. Doppler parameters are consistent with abnormal left ventricular relaxation (grade 1 diastolic dysfunction). - Aortic valve: There was trivial regurgitation.  PHYSICAL EXAM  Temp:  [97.7 F (36.5 C)-98.1 F (36.7 C)] 98.1 F (36.7 C) (08/04 1347) Pulse Rate:  [73-83] 83 (08/04 1347) Resp:  [18-20] 20 (08/04 1347) BP: (94-123)/(57-76) 123/66 mmHg (08/04 1347) SpO2:  [96 %-98 %] 98 % (08/04 1347) Weight:  [140 lb (63.504 kg)] 140 lb (63.504 kg) (08/04 1000)  General - Well nourished, well developed, in no apparent distress.  Ophthalmologic - Sharp disc margins OU.   Cardiovascular - Regular rate and rhythm with no murmur.  Mental Status -  Level of arousal and orientation to time, place, and person were intact. Language including expression, naming, repetition, comprehension was assessed and found intact. Fund of Knowledge was assessed and was intact.  Cranial Nerves II - XII - II - Visual field intact OU. III, IV, VI - Extraocular movements intact. V - Facial sensation intact bilaterally. VII - Facial movement intact bilaterally. VIII - Hearing & vestibular intact bilaterally. X - Palate elevates symmetrically. XI - Chin turning & shoulder shrug intact bilaterally. XII - Tongue protrusion intact.  Motor Strength - The patient's strength was normal in all extremities and pronator drift was absent.  Bulk was normal and fasciculations were absent.   Motor Tone - Muscle tone was assessed at the neck and appendages and was normal.  Reflexes - The patient's reflexes were 1+ in all extremities and she had no pathological reflexes.  Sensory - Light touch, temperature/pinprick, vibration and proprioception, and Romberg testing were assessed and were symmetrical.    Coordination - The patient had normal movements in the hands and feet with no ataxia or dysmetria.  Tremor was absent.  Gait and Station - The patient's transfers, posture,  gait, station, and turns were observed as normal.   ASSESSMENT/PLAN Ms. Stephanie Donovan is a 81 y.o. female with history of a GI bleed and pruritis  presenting with itching and a heavy feeling in her head. She did not receive IV t-PA due to unknown time of onset and no significant deficits.  Encephalopathy - likely due to hypertension  Anxious due to pruritis  No Hx of HTN  EMS reported high BP on arrival  Likely due to hypertensive encephalopathy  Right tiny DWI restriction - incidental finding, secondary to small vessel disease.  MRI  As abpve.  MRA  Normal intracranial MR angiography of the large and medium size vessels  Carotid Doppler  unremarkable  2D Echo EF 55-60%  LDL 99  HgbA1c pending  Lovenox for VTE prophylaxis Diet Heart Room service appropriate?:  Yes; Fluid consistency:: Thin  no antithrombotic prior to admission, now on clopidogrel 75 mg orally every day  Patient counseled to be compliant with her antithrombotic medications  Ongoing aggressive stroke risk factor management  Therapy recommendations: Pending  Disposition: Pending  Hypertensive episode  Home meds: No antihypertensives medications prior to admission  Blood pressure somewhat low at times  No history of hypertension  However, BP high on EMS arrival  Hyperlipidemia  Home meds: No lipid lowering medications prior to admission  LDL 99, goal < 70  Add Lipitor 20 mg daily  Continue statin at discharge  Other Stroke Risk Factors  Advanced age  Other Active Problems  Pruritis   Other Pertinent History  She was supposed to be on a baby ASA daily prior to admission but was not taking it.   Aspirin allergy - mixed reactions   Impression    Probable transient encephalopathy secondary to hypertension secondary to anxiety.  Punctate acute infarction at the right posterior frontal vertex.  Hospital day # 1  Neurology will sign off. Please call with questions. Pt  will follow up with Dr. Erlinda Hong at [facility] in about 2 months. Thanks for the consult.  Rosalin Hawking, MD PhD Stroke Neurology 08/26/2014 10:14 PM  To contact Stroke Continuity provider, please refer to [email or web address] After hours, contact General Neurology

## 2014-08-26 NOTE — Progress Notes (Signed)
VASCULAR LAB PRELIMINARY  PRELIMINARY  PRELIMINARY  PRELIMINARY  Carotid duplex completed.    Preliminary report:  Right - No evidence of ICA stenosis. Vertebral artery flow is antegrade. Left - 1% to 39% ICA stenosis lower end of scale. Vertebral artery flow is antegrade.  [place], RVS 08/26/2014, 1:03 PM

## 2014-08-26 NOTE — Progress Notes (Signed)
*  PRELIMINARY RESULTS* Echocardiogram 2D Echocardiogram has been performed.  Stephanie Donovan 08/26/2014, 11:07 AM

## 2014-08-26 NOTE — Progress Notes (Signed)
Stephanie Donovan to be D/C'd Home per MD order.  Discussed with the patient and all questions fully answered.  VSS, Skin clean, dry and intact without evidence of skin break down, no evidence of skin tears noted. IV catheter discontinued intact. Site without signs and symptoms of complications. Dressing and pressure applied.  An After Visit Summary was printed and given to the patient. Patient received prescription.  D/c education completed with patient/family including follow up instructions, medication list, d/c activities limitations if indicated, with other d/c instructions as indicated by MD - patient able to verbalize understanding, all questions fully answered.   Patient instructed to return to ED, call 911, or call MD for any changes in condition.   Patient escorted via [place], and D/C home via private auto.  [name] D 08/26/2014 4:15 PM

## 2014-08-26 NOTE — Discharge Summary (Signed)
Physician Discharge Summary  Stephanie Donovan XTA:[id number] DOB: 04-Oct-1927 DOA: 08/25/2014  PCP: [name], MD  Admit date: 08/25/2014 Discharge date: 08/26/2014  Recommendations for Outpatient Follow-up:     Discharge Diagnoses:  Acute punctate cerebral infarct Active Problems:   Aphasia Pruritis hyperlipidemia  Discharge Condition: stable  Diet recommendation: heart healthy  Filed Weights   08/26/14 1000  Weight: 63.504 kg (140 lb)    History of present illness:  81 y.o. female  Patient with a history of thyroid disease presents describing an episode of intense confusion and difficulty speaking without visual impairment, nausea, lateralizing weakness. She reports head pressure that is generalized. Symptoms started after waking because of itching "all over" that was "more than she could stand". She has been treated for pruritis for the past 3 months. She got up and took a cool shower which did not help the itching. She went downstairs and she had sudden onset of symptoms that included head pressure, aphasia ("I couldn't speak") and confusion, prompting a call to EMS and evaluation in the emergency department. She states the symptoms have predominantly subsided with the exception of the pressure feeling in her head which is better but persists. No history of stroke or TIA in the past.   ED course, CT head, EKG, basic labs unremarkable, patient reported symptom has subsided. ED consulted neurology, and hospitalist asked to admit the patient.  Patient reported has been having pruritis for the last 21months was evaluated by an allergy specialist, but all the test were negative. She reported head heaviness, difficulty finding words this am, but still able to call the ambulance. She denies confusion when i asked her. She denies chest pain, no sob,no dizziness, no seizure like activity,no fever,no n/v,no edema.  Hospital Course:  Mr Virgel Paling [facility] Contrast 08/25/2014  Punctate acute  infarction at the right posterior frontal vertex. No other acute finding.  Moderate chronic small vessel ischemic changes elsewhere throughout the brain as outlined above.  Normal intracranial MR angiography of the large and medium size vessels.   Carotid US - Bilateral: 1-39% ICA stenosis. Vertebral artery flow is antegrade.  2D echo - - Left ventricle: The cavity size was normal. Systolic function was normal. The estimated ejection fraction was in the range of 55% to 60%. Wall motion was normal; there were no regional wall motion abnormalities. Doppler parameters are consistent with abnormal left ventricular relaxation (grade 1 diastolic dysfunction). - Aortic valve: There was trivial regurgitation.   did not receive IV t-PA due to unknown time of onset and no significant deficits.  Right tiny DWI restriction - incidental finding, secondary to small vessel disease.  MRI with punctate infarct right posterior frontal vertex  MRA Normal intracranial MR angiography of the large and medium size vessels  Carotid Doppler unremarkable  2D Echo EF 55-60%  LDL 99  HgbA1c pending  no antithrombotic prior to admission, now on clopidogrel 75 mg orally every day  Hyperlipidemia  Home meds: No lipid lowering medications prior to admission  LDL 99, goal < 70  Add Lipitor 20 mg daily  Continue statin at discharge  Procedures:  none  Consultations:  neurology  Discharge Exam: Filed Vitals:   08/26/14 1347  BP: 123/66  Pulse: 83  Temp: 98.1 F (36.7 C)  Resp: 20    General: a and o Cardiovascular: RRR Respiratory: CTA Neuro: nonfocal  Discharge Instructions   Discharge Instructions    Diet - low sodium heart healthy    Complete by:  As directed      Increase activity slowly    Complete by:  As directed           Current Discharge Medication List    START taking these medications   Details  atorvastatin (LIPITOR) 20 MG tablet Take 1  tablet (20 mg total) by mouth daily. Qty: 30 tablet, Refills: 1    clopidogrel (PLAVIX) 75 MG tablet Take 1 tablet (75 mg total) by mouth daily. Qty: 30 tablet, Refills: 1      CONTINUE these medications which have NOT CHANGED   Details  calcium-vitamin D (OSCAL WITH D) 500-200 MG-UNIT per tablet Take 1 tablet by mouth every morning.    gabapentin (NEURONTIN) 300 MG capsule Take 300 mg by mouth at bedtime.    ibuprofen (ADVIL,MOTRIN) 200 MG tablet Take 200 mg by mouth every 6 (six) hours as needed for headache or moderate pain (pain).     latanoprost (XALATAN) 0.005 % ophthalmic solution Place 1 drop into both eyes at bedtime.    levothyroxine (SYNTHROID, LEVOTHROID) 100 MCG tablet Take 100 mcg by mouth daily before breakfast.    Multiple Vitamin (MULTIVITAMIN WITH MINERALS) TABS Take 1 tablet by mouth every morning.    timolol (TIMOPTIC) 0.5 % ophthalmic solution Place 1 drop into both eyes 2 (two) times daily.      STOP taking these medications     cyclobenzaprine (FLEXERIL) 5 MG tablet      HYDROcodone-acetaminophen (NORCO) 5-325 MG per tablet      polyethylene glycol (MIRALAX / GLYCOLAX) packet      senna-docusate (SENOKOT-S) 8.6-50 MG per tablet      traMADol-acetaminophen (ULTRACET) 37.5-325 MG per tablet        Allergies  Allergen Reactions  . Aspirin     Mixed reactions  . Penicillins     Lost hearing temporarily  . Codeine Palpitations      The results of significant diagnostics from this hospitalization (including imaging, microbiology, ancillary and laboratory) are listed below for reference.    Significant Diagnostic Studies: Ct Head Wo Contrast  08/25/2014   CLINICAL DATA:  Weakness and pruritus  EXAM: CT HEAD WITHOUT CONTRAST  TECHNIQUE: Contiguous axial images were obtained from the base of the skull through the vertex without intravenous contrast.  COMPARISON:  01/23/2014  FINDINGS: There is no intracranial hemorrhage, mass or evidence of acute  infarction. There is mild generalized atrophy. There is mild chronic microvascular ischemic change. There is no significant extra-axial fluid collection.  No acute intracranial findings are evident. No bony abnormalities are evident. The visible paranasal sinuses are clear  IMPRESSION: Mild generalized atrophy and chronic microvascular disease. No acute findings.   Electronically Signed   By: Andreas Newport M.D.   On: 08/25/2014 05:45   Mr Jodene Nam Head Wo Contrast  08/25/2014   CLINICAL DATA:  Aphasia. Episode of intense confusion and difficulty speaking. Pressure sensation in the head. Symptoms developed this morning. No prior history of stroke.  EXAM: MRI HEAD WITHOUT CONTRAST  MRA HEAD WITHOUT CONTRAST  TECHNIQUE: Multiplanar, multiecho pulse sequences of the brain and surrounding structures were obtained without intravenous contrast. Angiographic images of the head were obtained using MRA technique without contrast.  COMPARISON:  Head CT same day.  Head CT 01/23/2014.  FINDINGS: MRI HEAD FINDINGS  There is a punctate focus of restricted diffusion at the right posterior frontal vertex consistent with a tiny acute infarction. No other acute infarction is seen. There are mild chronic small-vessel changes  of the pons. No focal cerebellar insult. The cerebral hemispheres show chronic small-vessel ischemic changes affecting the thalami, basal ganglia and throughout the deep and subcortical white matter. No large vessel territory infarction. No mass lesion, hemorrhage, hydrocephalus or extra-axial collection. No pituitary mass. No inflammatory sinus disease. No skull or skullbase lesion.  MRA HEAD FINDINGS  Both internal carotid arteries are widely patent into the brain. The anterior and middle cerebral vessels appear normal without proximal stenosis, aneurysm or vascular malformation. Both vertebral arteries are patent with the right being dominant. No basilar stenosis. Posterior circulation branch vessels appear  normal.  IMPRESSION: Punctate acute infarction at the right posterior frontal vertex. No other acute finding.  Moderate chronic small vessel ischemic changes elsewhere throughout the brain as outlined above.  Normal intracranial MR angiography of the large and medium size vessels.   Electronically Signed   By: Nelson Chimes M.D.   On: 08/25/2014 14:02   Mr Brain Wo Contrast  08/25/2014   CLINICAL DATA:  Aphasia. Episode of intense confusion and difficulty speaking. Pressure sensation in the head. Symptoms developed this morning. No prior history of stroke.  EXAM: MRI HEAD WITHOUT CONTRAST  MRA HEAD WITHOUT CONTRAST  TECHNIQUE: Multiplanar, multiecho pulse sequences of the brain and surrounding structures were obtained without intravenous contrast. Angiographic images of the head were obtained using MRA technique without contrast.  COMPARISON:  Head CT same day.  Head CT 01/23/2014.  FINDINGS: MRI HEAD FINDINGS  There is a punctate focus of restricted diffusion at the right posterior frontal vertex consistent with a tiny acute infarction. No other acute infarction is seen. There are mild chronic small-vessel changes of the pons. No focal cerebellar insult. The cerebral hemispheres show chronic small-vessel ischemic changes affecting the thalami, basal ganglia and throughout the deep and subcortical white matter. No large vessel territory infarction. No mass lesion, hemorrhage, hydrocephalus or extra-axial collection. No pituitary mass. No inflammatory sinus disease. No skull or skullbase lesion.  MRA HEAD FINDINGS  Both internal carotid arteries are widely patent into the brain. The anterior and middle cerebral vessels appear normal without proximal stenosis, aneurysm or vascular malformation. Both vertebral arteries are patent with the right being dominant. No basilar stenosis. Posterior circulation branch vessels appear normal.  IMPRESSION: Punctate acute infarction at the right posterior frontal vertex. No other  acute finding.  Moderate chronic small vessel ischemic changes elsewhere throughout the brain as outlined above.  Normal intracranial MR angiography of the large and medium size vessels.   Electronically Signed   By: Nelson Chimes M.D.   On: 08/25/2014 14:02    Microbiology: No results found for this or any previous visit (from the past 240 hour(s)).   Labs: Basic Metabolic Panel:  Recent Labs Lab 08/25/14 0504 08/25/14 1117 08/26/14 0453  NA 140  --  138  K 3.6  --  3.9  CL 105  --  102  CO2 28  --  32  GLUCOSE 104*  --  96  BUN 13  --  13  CREATININE 0.54 0.67 0.65  CALCIUM 8.6*  --  8.6*   Liver Function Tests:  Recent Labs Lab 08/25/14 0504 08/26/14 0453  AST 14* 16  ALT 16 15  ALKPHOS 100 88  BILITOT 0.5 0.5  PROT 6.1* 6.1*  ALBUMIN 3.2* 2.9*   No results for input(s): LIPASE, AMYLASE in the last 168 hours.  Recent Labs Lab 08/25/14 1114  AMMONIA 26   CBC:  Recent Labs Lab 08/25/14 0504  08/25/14 1117 08/26/14 0453  WBC 7.5 8.4 7.7  NEUTROABS 4.8  --   --   HGB 12.7 13.1 12.2  HCT 39.1 40.5 38.0  MCV 87.1 88.0 86.4  PLT 158 168 152   Cardiac Enzymes: No results for input(s): CKTOTAL, CKMB, CKMBINDEX, TROPONINI in the last 168 hours. BNP: BNP (last 3 results) No results for input(s): BNP in the last 8760 hours.  ProBNP (last 3 results) No results for input(s): PROBNP in the last 8760 hours.  CBG: No results for input(s): GLUCAP in the last 168 hours.     SignedDelfina Redwood  [facility] 08/26/2014, 2:56 PM

## 2014-08-26 NOTE — Progress Notes (Signed)
PT Cancellation/Discharge Note  Patient Details Name: GLEN BLATCHLEY MRN: [id number] DOB: 05-18-1927   Cancelled Treatment:    Reason Eval/Treat Not Completed: PT screened, no needs identified, will sign off. Met patient in hallway - ambulating independently and dressed for d/c.  Patient appeared to have good balance.  She reports she is "moving fine".  No PT needs identified - will sign off.   Despina Pole 08/26/2014, 4:14 PM Carita Pian. Sanjuana Kava, [place] Pager [phone]

## 2014-08-27 LAB — HEMOGLOBIN A1C
Hgb A1c MFr Bld: 5.5 % (ref 4.8–5.6)
MEAN PLASMA GLUCOSE: 111 mg/dL

## 2014-09-21 ENCOUNTER — Emergency Department (HOSPITAL_COMMUNITY)
Admission: EM | Admit: 2014-09-21 | Discharge: 2014-09-21 | Disposition: A | Discharge status: Home / Self Care | Payer: Medicare Other | Attending: Emergency Medicine | Admitting: Emergency Medicine

## 2014-09-21 ENCOUNTER — Emergency Department (HOSPITAL_COMMUNITY): Payer: Medicare Other

## 2014-09-21 DIAGNOSIS — M25552 Pain in left hip: Secondary | ICD-10-CM | POA: Diagnosis present

## 2014-09-21 DIAGNOSIS — Z8719 Personal history of other diseases of the digestive system: Secondary | ICD-10-CM | POA: Diagnosis not present

## 2014-09-21 DIAGNOSIS — G8929 Other chronic pain: Secondary | ICD-10-CM | POA: Insufficient documentation

## 2014-09-21 DIAGNOSIS — M1610 Unilateral primary osteoarthritis, unspecified hip: Secondary | ICD-10-CM

## 2014-09-21 DIAGNOSIS — Z880 Allergy status to penicillin: Secondary | ICD-10-CM | POA: Diagnosis not present

## 2014-09-21 DIAGNOSIS — Z79899 Other long term (current) drug therapy: Secondary | ICD-10-CM | POA: Diagnosis not present

## 2014-09-21 DIAGNOSIS — H409 Unspecified glaucoma: Secondary | ICD-10-CM | POA: Insufficient documentation

## 2014-09-21 DIAGNOSIS — Z9889 Other specified postprocedural states: Secondary | ICD-10-CM | POA: Insufficient documentation

## 2014-09-21 DIAGNOSIS — E039 Hypothyroidism, unspecified: Secondary | ICD-10-CM | POA: Diagnosis not present

## 2014-09-21 DIAGNOSIS — Z87828 Personal history of other (healed) physical injury and trauma: Secondary | ICD-10-CM | POA: Insufficient documentation

## 2014-09-21 DIAGNOSIS — M169 Osteoarthritis of hip, unspecified: Secondary | ICD-10-CM | POA: Diagnosis not present

## 2014-09-21 DIAGNOSIS — Z8679 Personal history of other diseases of the circulatory system: Secondary | ICD-10-CM | POA: Insufficient documentation

## 2014-09-21 DIAGNOSIS — Z8673 Personal history of transient ischemic attack (TIA), and cerebral infarction without residual deficits: Secondary | ICD-10-CM | POA: Insufficient documentation

## 2014-09-21 DIAGNOSIS — Z7982 Long term (current) use of aspirin: Secondary | ICD-10-CM | POA: Diagnosis not present

## 2014-09-21 MED ORDER — HYDROCODONE-ACETAMINOPHEN 5-325 MG PO TABS
2.0000 | ORAL_TABLET | Freq: Once | ORAL | Status: AC
  Administered 2014-09-21: 1 via ORAL
  Filled 2014-09-21: qty 2

## 2014-09-21 MED ORDER — HYDROCODONE-ACETAMINOPHEN 5-325 MG PO TABS: 1.0000 | ORAL_TABLET | ORAL | Status: DC | PRN

## 2014-09-21 NOTE — ED Provider Notes (Signed)
CSN: [id number]     Arrival date & time 09/21/14  0932 History   First MD Initiated Contact with Patient 09/21/14 0935     Chief Complaint  Patient presents with  . Hip Pain     (Consider location/radiation/quality/duration/timing/severity/associated sxs/prior Treatment) HPI Comments: 81 year old female with history of hypothyroid, atrial fibrillation, pubic bone fracture, TIA presents with left hip pain since waking this morning. No history of similar, no recent falls. No fevers or chills. No other joints involved. Pain with movement. No weakness or numbness. No back problems in the past. Pain controlled with not moving her head.  Patient is a 81 y.o. female presenting with hip pain. The history is provided by the patient.  Hip Pain Pertinent negatives include no chest pain, no abdominal pain, no headaches and no shortness of breath.    Past Medical History  Diagnosis Date  . Blood transfusion without reported diagnosis   . Thyroid disease   . GI bleed   . Glaucoma   . Chronic back pain    Past Surgical History  Procedure Laterality Date  . Joint replacement  11/20/2011    LTKR  . Colonoscopy     Family History  Problem Relation Age of Onset  . Osteoporosis Mother   . Cancer - Colon Father   . Schizophrenia Son    Social History  Substance Use Topics  . Smoking status: Never Smoker   . Smokeless tobacco: Never Used  . Alcohol Use: No   OB History    No data available     Review of Systems  Constitutional: Negative for fever and chills.  HENT: Negative for congestion.   Eyes: Negative for visual disturbance.  Respiratory: Negative for shortness of breath.   Cardiovascular: Negative for chest pain.  Gastrointestinal: Negative for vomiting and abdominal pain.  Genitourinary: Negative for dysuria and flank pain.  Musculoskeletal: Positive for arthralgias and gait problem. Negative for back pain, joint swelling, neck pain and neck stiffness.  Skin: Negative for  rash.  Neurological: Negative for light-headedness and headaches.      Allergies  Aspirin; Penicillins; and Codeine  Home Medications   Prior to Admission medications   Medication Sig Start Date End Date Taking? Authorizing Provider  aspirin 81 MG tablet Take 162 mg by mouth at bedtime.   Yes Historical Provider, MD  calcium-vitamin D (OSCAL WITH D) 500-200 MG-UNIT per tablet Take 1 tablet by mouth every morning.   Yes Historical Provider, MD  latanoprost (XALATAN) 0.005 % ophthalmic solution Place 1 drop into both eyes at bedtime.   Yes Historical Provider, MD  levothyroxine (SYNTHROID, LEVOTHROID) 88 MCG tablet Take 88 mcg by mouth daily. 08/27/14  Yes Historical Provider, MD  timolol (TIMOPTIC) 0.5 % ophthalmic solution Place 1 drop into both eyes 2 (two) times daily.   Yes Historical Provider, MD  triamcinolone cream (KENALOG) 0.5 % Apply 1 application topically 2 (two) times daily. 09/13/14  Yes Historical Provider, MD  atorvastatin (LIPITOR) 20 MG tablet Take 1 tablet (20 mg total) by mouth daily. Patient not taking: Reported on 09/21/2014 08/26/14   Delfina Redwood, MD  clopidogrel (PLAVIX) 75 MG tablet Take 1 tablet (75 mg total) by mouth daily. Patient not taking: Reported on 09/21/2014 08/26/14   Delfina Redwood, MD  HYDROcodone-acetaminophen (NORCO) 5-325 MG per tablet Take 1 tablet by mouth every 4 (four) hours as needed. 09/21/14   Elnora Morrison, MD   BP 143/79 mmHg  Pulse 83  Temp(Src) 98.7 F (  37.1 C) (Oral)  Resp 18  SpO2 97% Physical Exam  Constitutional: She is oriented to person, place, and time. She appears well-developed and well-nourished.  HENT:  Head: Normocephalic and atraumatic.  Eyes: Conjunctivae are normal. Right eye exhibits no discharge. Left eye exhibits no discharge.  Neck: Normal range of motion. Neck supple. No tracheal deviation present.  Cardiovascular: Normal rate.   Pulmonary/Chest: Effort normal.  Abdominal: Soft. There is no tenderness.   Musculoskeletal: She exhibits tenderness. She exhibits no edema.  Patient has moderate tenderness with flexion and external rotation of left hip. Neurovascular intact left leg. No warmth or swelling to lateral hip or knee. No focal tenderness to the lateral bursa on the left hip. Equal leg lengths.  Neurological: She is alert and oriented to person, place, and time.  Skin: Skin is warm. No rash noted.  Psychiatric: She has a normal mood and affect.  Nursing note and vitals reviewed.   ED Course  Procedures (including critical care time) Labs Review Labs Reviewed - No data to display  Imaging Review Dg Hip Unilat With Pelvis 1v Left  09/21/2014   CLINICAL DATA:  Acute onset of left hip pain when the patient awoke earlier today 0600 hr, painful enough that the patient is unable to walk. No recent injuries.  EXAM: DG HIP (WITH OR WITHOUT PELVIS) 2V*L*  COMPARISON:  04/11/2014.  FINDINGS: No evidence of acute fracture or dislocation. Old healed fractures involving the left superior and inferior pubic rami. Severe axial joint space narrowing. Osseous demineralization.  Included AP pelvis demonstrates no acute fractures elsewhere. Symmetric severe axial joint space narrowing in the contralateral right hip. Sacroiliac joints and symphysis pubis intact.  IMPRESSION: 1. No acute osseous abnormality. 2. Severe osteoarthritis. 3. Old healed fractures involving the left superior and inferior pubic rami. 4. Osseous demineralization.   Electronically Signed   By: Evangeline Dakin M.D.   On: 09/21/2014 10:43   I have personally reviewed and evaluated these images and lab results as part of my medical decision-making.   EKG Interpretation None      MDM   Final diagnoses:  Acute hip pain, left  Hip arthritis   Patient presents with acute left hip pain since this morning. Patient does have pubic bone fracture however no recent falls. No concern for infection at this time. Likely arthritis, plan for  x-ray, pain meds and close outpatient follow-up with orthopedics.  Patient's pain improved on reassessment. Social work consult for help at home and ordering a   walker. Results and differential diagnosis were discussed with the patient/parent/guardian. Xrays were independently reviewed by myself.  Close follow up outpatient was discussed, comfortable with the plan.   Medications  HYDROcodone-acetaminophen (NORCO/VICODIN) 5-325 MG per tablet 2 tablet (1 tablet Oral Given 09/21/14 1021)    Filed Vitals:   09/21/14 0938  BP: 143/79  Pulse: 83  Temp: 98.7 F (37.1 C)  TempSrc: Oral  Resp: 18  SpO2: 97%    Final diagnoses:  Acute hip pain, left  Hip arthritis       Elnora Morrison, MD 09/21/14 1247

## 2014-09-21 NOTE — ED Notes (Signed)
81 yo from home with left hip pain. Reports waking up this morning with the pain. Per [facility] no trauma to left lower extremity, pt was able to stand. Appears to use walker and lift for ambulation at home. Alert and Oriented. None-compliance with medications per [facility] pt "I don't take any of my meds anymore, I just take aspirin." Vitals Stable.

## 2014-09-21 NOTE — ED Notes (Signed)
Pt. Walk well to the bathroom with a walker without any assistant.

## 2014-09-21 NOTE — Discharge Instructions (Signed)
If you were given medicines take as directed.  If you are on coumadin or contraceptives realize their levels and effectiveness is altered by many different medicines.  If you have any reaction (rash, tongues swelling, other) to the medicines stop taking and see a physician.   For severe pain take norco or vicodin however realize they have the potential for addiction and it can make you sleepy and has tylenol in it.  No operating machinery while taking.  If your blood pressure was elevated in the ER make sure you follow up for management with a primary doctor or return for chest pain, shortness of breath or stroke symptoms.  Please follow up as directed and return to the ER or see a physician for new or worsening symptoms.  Thank you. Filed Vitals:   09/21/14 0938  BP: 143/79  Pulse: 83  Temp: 98.7 F (37.1 C)  TempSrc: Oral  Resp: 18  SpO2: 97%

## 2014-09-21 NOTE — ED Notes (Signed)
Pt walked with a walker in hall way, states she has a walker at home to use. Pain is better walked with out difficulty. pts daughter is coming to get her.

## 2014-09-21 NOTE — ED Notes (Signed)
Patient transported to X-ray 

## 2017-07-19 ENCOUNTER — Emergency Department (HOSPITAL_COMMUNITY)
Admission: EM | Admit: 2017-07-19 | Discharge: 2017-07-19 | Disposition: A | Discharge status: Home / Self Care | Payer: Medicare Other | Attending: Emergency Medicine | Admitting: Emergency Medicine

## 2017-07-19 ENCOUNTER — Encounter (HOSPITAL_COMMUNITY): Payer: Self-pay | Admitting: Emergency Medicine

## 2017-07-19 ENCOUNTER — Emergency Department (HOSPITAL_COMMUNITY): Payer: Medicare Other

## 2017-07-19 DIAGNOSIS — E039 Hypothyroidism, unspecified: Secondary | ICD-10-CM | POA: Insufficient documentation

## 2017-07-19 DIAGNOSIS — Z79899 Other long term (current) drug therapy: Secondary | ICD-10-CM | POA: Insufficient documentation

## 2017-07-19 DIAGNOSIS — R002 Palpitations: Secondary | ICD-10-CM | POA: Diagnosis not present

## 2017-07-19 DIAGNOSIS — R079 Chest pain, unspecified: Secondary | ICD-10-CM | POA: Diagnosis present

## 2017-07-19 DIAGNOSIS — Z7982 Long term (current) use of aspirin: Secondary | ICD-10-CM | POA: Insufficient documentation

## 2017-07-19 LAB — I-STAT TROPONIN, ED
Troponin i, poc: 0.01 ng/mL (ref 0.00–0.08)
Troponin i, poc: 0 ng/mL (ref 0.00–0.08)

## 2017-07-19 LAB — BASIC METABOLIC PANEL
Anion gap: 9 (ref 5–15)
BUN: 11 mg/dL (ref 8–23)
CHLORIDE: 106 mmol/L (ref 98–111)
CO2: 25 mmol/L (ref 22–32)
Calcium: 8.5 mg/dL — ABNORMAL LOW (ref 8.9–10.3)
Creatinine, Ser: 0.67 mg/dL (ref 0.44–1.00)
GFR calc Af Amer: >60 mL/min (ref >60)
GFR calc non Af Amer: >60 mL/min (ref >60)
GLUCOSE: 102 mg/dL — AB (ref 70–99)
POTASSIUM: 3.6 mmol/L (ref 3.5–5.1)
Sodium: 140 mmol/L (ref 135–145)

## 2017-07-19 LAB — CBC
HCT: 39.9 % (ref 36.0–46.0)
Hemoglobin: 12.8 g/dL (ref 12.0–15.0)
MCH: 28.4 pg (ref 26.0–34.0)
MCHC: 32.1 g/dL (ref 30.0–36.0)
MCV: 88.5 fL (ref 78.0–100.0)
Platelets: 172 10*3/uL (ref 150–400)
RBC: 4.51 MIL/uL (ref 3.87–5.11)
RDW: 13.3 % (ref 11.5–15.5)
WBC: 5.9 10*3/uL (ref 4.0–10.5)

## 2017-07-19 NOTE — ED Triage Notes (Addendum)
Per [facility]- pt from home for c.o. Heart racing, pt pointing to her throat/ upper chest when describing sensation. States it is not doing it currently. Denies pain. Per EMS- pt had multiple body lice on herself, largest one being size of a tick.

## 2017-07-19 NOTE — ED Notes (Signed)
Patient verbalizes understanding of discharge instructions. Opportunity for questioning and answers were provided. Armband removed by staff, pt discharged from ED.  

## 2017-07-19 NOTE — ED Provider Notes (Signed)
[facility] EMERGENCY DEPARTMENT Provider Note   CSN: [id number] Arrival date & time: 07/19/17  2951     History   Chief Complaint Chief Complaint  Patient presents with  . Chest Pain    HPI Stephanie Donovan is a 84 y.o. female.  Patient brought in by EMS.  Patient called EMS because she thought her heart was racing.  Early in the morning she thinks she got upset and had heart racing else was going very fast had a little fullness feeling in her throat and upper chest but no severe pain.  Patient upon arrival here stated is not doing a currently.  Here EMS reported that she had multiple body lice on herself.  None present now.     Past Medical History:  Diagnosis Date  . Blood transfusion without reported diagnosis   . Chronic back pain   . GI bleed   . Glaucoma   . Thyroid disease     Patient Active Problem List   Diagnosis Date Noted  . TIA (transient ischemic attack) 08/25/2014  . Aphasia 08/25/2014  . Chronic constipation 05/19/2014  . Fracture of multiple pubic rami ([place]) 04/11/2014  . Pubic bone fracture ([place]) 04/11/2014  . Left hip pain 04/11/2014  . Atrial fibrillation with RVR ([place]) 03/28/2013  . Diarrhea 03/28/2013  . Hypothyroidism 03/24/2013  . Syncope 03/24/2013  . Glaucoma 03/24/2013  . Hypokalemia 03/24/2013  . Acute gastroenteritis 03/24/2013    Past Surgical History:  Procedure Laterality Date  . COLONOSCOPY    . JOINT REPLACEMENT  11/20/2011   LTKR     OB History   None      Home Medications    Prior to Admission medications   Medication Sig Start Date End Date Taking? Authorizing Provider  aspirin 81 MG tablet Take 162 mg by mouth at bedtime.    [provider]  atorvastatin (LIPITOR) 20 MG tablet Take 1 tablet (20 mg total) by mouth daily. Patient not taking: Reported on 09/21/2014 08/26/14   Delfina Redwood, MD  calcium-vitamin D (OSCAL WITH D) 500-200 MG-UNIT per tablet Take 1 tablet by mouth  every morning.    [provider]  clopidogrel (PLAVIX) 75 MG tablet Take 1 tablet (75 mg total) by mouth daily. Patient not taking: Reported on 09/21/2014 08/26/14   Delfina Redwood, MD  HYDROcodone-acetaminophen [facility]) 5-325 MG per tablet Take 1 tablet by mouth every 4 (four) hours as needed. 09/21/14   Elnora Morrison, MD  latanoprost (XALATAN) 0.005 % ophthalmic solution Place 1 drop into both eyes at bedtime.    [provider]  levothyroxine (SYNTHROID, LEVOTHROID) 88 MCG tablet Take 88 mcg by mouth daily. 08/27/14   [provider]  timolol (TIMOPTIC) 0.5 % ophthalmic solution Place 1 drop into both eyes 2 (two) times daily.    [provider]  triamcinolone cream (KENALOG) 0.5 % Apply 1 application topically 2 (two) times daily. 09/13/14   [provider]    Family History Family History  Problem Relation Age of Onset  . Osteoporosis Mother   . Cancer - Colon Father   . Schizophrenia Son     Social History Social History   Tobacco Use  . Smoking status: Never Smoker  . Smokeless tobacco: Never Used  Substance Use Topics  . Alcohol use: No  . Drug use: No     Allergies   Aspirin; Penicillins; and Codeine   Review of Systems Review of Systems  Constitutional:  Negative for fever.  HENT: Positive for trouble swallowing.   Eyes: Negative for visual disturbance.  Respiratory: Negative for shortness of breath.   Cardiovascular: Positive for chest pain. Negative for palpitations.  Gastrointestinal: Negative for abdominal pain.  Genitourinary: Negative for dysuria.  Musculoskeletal: Negative for back pain.  Skin: Negative for rash.  Neurological: Negative for syncope and headaches.  Hematological: Does not bruise/bleed easily.  Psychiatric/Behavioral: Negative for confusion.     Physical Exam Updated Vital Signs BP 139/88   Pulse 93   Temp 98.5 F (36.9 C) (Oral)   Resp 13   SpO2 97%   Physical Exam  Constitutional:  She appears well-developed and well-nourished. No distress.  HENT:  Head: Normocephalic and atraumatic.  Mouth/Throat: Oropharynx is clear and moist.  Eyes: Pupils are equal, round, and reactive to light. Conjunctivae and EOM are normal.  Neck: Neck supple.  Cardiovascular: Normal rate, regular rhythm and normal heart sounds.  Pulmonary/Chest: Effort normal and breath sounds normal. No respiratory distress.  Abdominal: Soft. Bowel sounds are normal. There is no tenderness.  Neurological: She is alert. No cranial nerve deficit or sensory deficit. She exhibits normal muscle tone. Coordination normal.  Skin: Skin is warm. No rash noted. No erythema.  Nursing note and vitals reviewed.    ED Treatments / Results  Labs (all labs ordered are listed, but only abnormal results are displayed) Labs Reviewed  BASIC METABOLIC PANEL - Abnormal; Notable for the following components:      Result Value   Glucose, Bld 102 (*)    Calcium 8.5 (*)    All other components within normal limits  CBC  I-STAT TROPONIN, ED  I-STAT TROPONIN, ED    EKG EKG Interpretation  Date/Time:  Friday July 19 2017 07:14:33 EDT Ventricular Rate:  100 PR Interval:    QRS Duration: 97 QT Interval:  364 QTC Calculation: 470 R Axis:   64 Text Interpretation:  Sinus tachycardia No significant change since last tracing Confirmed by Fredia Sorrow [phone]) on 07/19/2017 7:29:09 AM Also confirmed by Fredia Sorrow [phone]), editor Hattie Perch [phone])  on 07/19/2017 7:45:32 AM   Radiology Dg Chest Port 1 View  Result Date: 07/19/2017 CLINICAL DATA:  Acute shortness of breath and chest pain today. EXAM: PORTABLE CHEST 1 VIEW COMPARISON:  03/28/2013 and prior chest radiographs FINDINGS: Cardiomegaly identified. There is no evidence of focal airspace disease, pulmonary edema, suspicious pulmonary nodule/mass, pleural effusion, or pneumothorax. No acute bony abnormalities are identified. IMPRESSION: Cardiomegaly  without evidence of acute cardiopulmonary disease. Electronically Signed   By: Margarette Canada M.D.   On: 07/19/2017 08:16    Procedures Procedures (including critical care time)  Medications Ordered in ED Medications - No data to display   Initial Impression / Assessment and Plan / ED Course  I have reviewed the triage vital signs and the nursing notes.  Pertinent labs & imaging results that were available during my care of the patient were reviewed by me and considered in my medical decision making (see chart for details).     Patient with troponins here x2 were negative.  EKG without acute changes.  Did have some borderline sinus tachycardia.  Chest x-ray negative.  Patient had symptoms all resolved completely shortly after arrival and did not recur.  Patient stable for discharge home.  No evidence of any bedbugs or body lice on my exam.  We will follow-up with nursing in regard to the home environment situation.  Patient stable for discharge home.  She  has primary care doctors to follow-up with.  Exact cause of the symptoms may have been a little bit of anxiety  Final Clinical Impressions(s) / ED Diagnoses   Final diagnoses:  Palpitations    ED Discharge Orders    None       Fredia Sorrow, MD 07/19/17 1538

## 2017-07-19 NOTE — Discharge Instructions (Addendum)
Work-up here without any acute findings.  Return for any new or worse symptoms.  Make an appointment to follow-up with your doctors.

## 2017-09-21 ENCOUNTER — Emergency Department (HOSPITAL_COMMUNITY): Payer: Medicare Other

## 2017-09-21 ENCOUNTER — Inpatient Hospital Stay (HOSPITAL_COMMUNITY)
Admission: EM | Admit: 2017-09-21 | Discharge: 2017-09-24 | DRG: 470 | Disposition: A | Discharge status: Skilled Nursing Facility | Payer: Medicare Other | Attending: Internal Medicine | Admitting: Internal Medicine

## 2017-09-21 ENCOUNTER — Encounter (HOSPITAL_COMMUNITY): Payer: Self-pay | Admitting: Emergency Medicine

## 2017-09-21 ENCOUNTER — Other Ambulatory Visit: Payer: Self-pay

## 2017-09-21 DIAGNOSIS — R000 Tachycardia, unspecified: Secondary | ICD-10-CM | POA: Diagnosis not present

## 2017-09-21 DIAGNOSIS — K5909 Other constipation: Secondary | ICD-10-CM | POA: Diagnosis not present

## 2017-09-21 DIAGNOSIS — Z9181 History of falling: Secondary | ICD-10-CM | POA: Diagnosis not present

## 2017-09-21 DIAGNOSIS — I480 Paroxysmal atrial fibrillation: Secondary | ICD-10-CM | POA: Diagnosis not present

## 2017-09-21 DIAGNOSIS — Z79899 Other long term (current) drug therapy: Secondary | ICD-10-CM

## 2017-09-21 DIAGNOSIS — Z8673 Personal history of transient ischemic attack (TIA), and cerebral infarction without residual deficits: Secondary | ICD-10-CM | POA: Diagnosis not present

## 2017-09-21 DIAGNOSIS — R52 Pain, unspecified: Secondary | ICD-10-CM | POA: Diagnosis not present

## 2017-09-21 DIAGNOSIS — W06XXXA Fall from bed, initial encounter: Secondary | ICD-10-CM | POA: Diagnosis present

## 2017-09-21 DIAGNOSIS — E860 Dehydration: Secondary | ICD-10-CM | POA: Diagnosis not present

## 2017-09-21 DIAGNOSIS — E871 Hypo-osmolality and hyponatremia: Secondary | ICD-10-CM | POA: Diagnosis not present

## 2017-09-21 DIAGNOSIS — Z09 Encounter for follow-up examination after completed treatment for conditions other than malignant neoplasm: Secondary | ICD-10-CM

## 2017-09-21 DIAGNOSIS — M6282 Rhabdomyolysis: Secondary | ICD-10-CM | POA: Diagnosis present

## 2017-09-21 DIAGNOSIS — R748 Abnormal levels of other serum enzymes: Secondary | ICD-10-CM | POA: Diagnosis present

## 2017-09-21 DIAGNOSIS — S42201A Unspecified fracture of upper end of right humerus, initial encounter for closed fracture: Secondary | ICD-10-CM | POA: Diagnosis not present

## 2017-09-21 DIAGNOSIS — S42202A Unspecified fracture of upper end of left humerus, initial encounter for closed fracture: Secondary | ICD-10-CM

## 2017-09-21 DIAGNOSIS — Z7902 Long term (current) use of antithrombotics/antiplatelets: Secondary | ICD-10-CM

## 2017-09-21 DIAGNOSIS — Z79890 Hormone replacement therapy (postmenopausal): Secondary | ICD-10-CM

## 2017-09-21 DIAGNOSIS — E041 Nontoxic single thyroid nodule: Secondary | ICD-10-CM | POA: Diagnosis not present

## 2017-09-21 DIAGNOSIS — S72001A Fracture of unspecified part of neck of right femur, initial encounter for closed fracture: Principal | ICD-10-CM | POA: Diagnosis present

## 2017-09-21 DIAGNOSIS — E039 Hypothyroidism, unspecified: Secondary | ICD-10-CM | POA: Diagnosis present

## 2017-09-21 DIAGNOSIS — Y92003 Bedroom of unspecified non-institutional (private) residence as the place of occurrence of the external cause: Secondary | ICD-10-CM

## 2017-09-21 DIAGNOSIS — H409 Unspecified glaucoma: Secondary | ICD-10-CM | POA: Diagnosis present

## 2017-09-21 DIAGNOSIS — N390 Urinary tract infection, site not specified: Secondary | ICD-10-CM | POA: Diagnosis present

## 2017-09-21 DIAGNOSIS — I5032 Chronic diastolic (congestive) heart failure: Secondary | ICD-10-CM | POA: Diagnosis present

## 2017-09-21 DIAGNOSIS — E876 Hypokalemia: Secondary | ICD-10-CM | POA: Diagnosis present

## 2017-09-21 LAB — TROPONIN I: Troponin I: <0.03 ng/mL (ref <0.03)

## 2017-09-21 LAB — CBC WITH DIFFERENTIAL/PLATELET
Abs Immature Granulocytes: 0.1 10*3/uL (ref 0.0–0.1)
BASOS ABS: 0 10*3/uL (ref 0.0–0.1)
Basophils Relative: 0 %
EOS PCT: 0 %
Eosinophils Absolute: 0 10*3/uL (ref 0.0–0.7)
HEMATOCRIT: 37 % (ref 36.0–46.0)
Hemoglobin: 12.2 g/dL (ref 12.0–15.0)
Immature Granulocytes: 1 %
LYMPHS ABS: 0.5 10*3/uL — AB (ref 0.7–4.0)
Lymphocytes Relative: 3 %
MCH: 29 pg (ref 26.0–34.0)
MCHC: 33 g/dL (ref 30.0–36.0)
MCV: 87.9 fL (ref 78.0–100.0)
Monocytes Absolute: 0.8 10*3/uL (ref 0.1–1.0)
Monocytes Relative: 5 %
Neutro Abs: 13.3 10*3/uL — ABNORMAL HIGH (ref 1.7–7.7)
Neutrophils Relative %: 91 %
Platelets: 190 10*3/uL (ref 150–400)
RBC: 4.21 MIL/uL (ref 3.87–5.11)
RDW: 13.4 % (ref 11.5–15.5)
WBC: 14.7 10*3/uL — AB (ref 4.0–10.5)

## 2017-09-21 LAB — BASIC METABOLIC PANEL
Anion gap: 11 (ref 5–15)
BUN: 15 mg/dL (ref 8–23)
CO2: 23 mmol/L (ref 22–32)
Calcium: 8.8 mg/dL — ABNORMAL LOW (ref 8.9–10.3)
Chloride: 98 mmol/L (ref 98–111)
Creatinine, Ser: 0.94 mg/dL (ref 0.44–1.00)
GFR calc Af Amer: >60 mL/min (ref >60)
GFR calc non Af Amer: 53 mL/min — ABNORMAL LOW (ref >60)
Glucose, Bld: 184 mg/dL — ABNORMAL HIGH (ref 70–99)
Potassium: 3.9 mmol/L (ref 3.5–5.1)
SODIUM: 132 mmol/L — AB (ref 135–145)

## 2017-09-21 LAB — CREATININE, URINE, RANDOM: CREATININE, URINE: 160.55 mg/dL

## 2017-09-21 LAB — SODIUM, URINE, RANDOM: Sodium, Ur: 72 mmol/L

## 2017-09-21 LAB — OSMOLALITY, URINE: OSMOLALITY UR: 542 mosm/kg (ref 300–900)

## 2017-09-21 LAB — CK: Total CK: 1197 U/L — ABNORMAL HIGH (ref 38–234)

## 2017-09-21 MED ORDER — TIMOLOL MALEATE 0.5 % OP SOLN
1.0000 [drp] | Freq: Two times a day (BID) | OPHTHALMIC | Status: DC
  Administered 2017-09-22 – 2017-09-24 (×6): 1 [drp] via OPHTHALMIC
  Filled 2017-09-21: qty 5

## 2017-09-21 MED ORDER — SODIUM CHLORIDE 0.9 % IV BOLUS
500.0000 mL | Freq: Once | INTRAVENOUS | Status: AC
  Administered 2017-09-21: 500 mL via INTRAVENOUS

## 2017-09-21 MED ORDER — POLYETHYLENE GLYCOL 3350 17 G PO PACK: 17.0000 g | PACK | Freq: Every day | ORAL | Status: DC | PRN

## 2017-09-21 MED ORDER — CIPROFLOXACIN IN D5W 400 MG/200ML IV SOLN
400.0000 mg | INTRAVENOUS | Status: DC
  Administered 2017-09-22 – 2017-09-23 (×2): 400 mg via INTRAVENOUS
  Filled 2017-09-21 (×2): qty 200

## 2017-09-21 MED ORDER — SODIUM CHLORIDE 0.9 % IV BOLUS
1000.0000 mL | Freq: Once | INTRAVENOUS | Status: AC
  Administered 2017-09-21: 1000 mL via INTRAVENOUS

## 2017-09-21 MED ORDER — HYDROCODONE-ACETAMINOPHEN 5-325 MG PO TABS
1.0000 | ORAL_TABLET | Freq: Four times a day (QID) | ORAL | Status: DC | PRN
  Administered 2017-09-21: 2 via ORAL
  Filled 2017-09-21: qty 2

## 2017-09-21 MED ORDER — MORPHINE SULFATE (PF) 2 MG/ML IV SOLN: 0.5000 mg | INTRAVENOUS | Status: DC | PRN

## 2017-09-21 MED ORDER — METHOCARBAMOL 1000 MG/10ML IJ SOLN
500.0000 mg | Freq: Four times a day (QID) | INTRAVENOUS | Status: DC | PRN
  Filled 2017-09-21: qty 5

## 2017-09-21 MED ORDER — FENTANYL CITRATE (PF) 100 MCG/2ML IJ SOLN
50.0000 ug | Freq: Once | INTRAMUSCULAR | Status: AC
Start: 2017-09-21 — End: 2017-09-21
  Administered 2017-09-21: 50 ug via INTRAVENOUS
  Filled 2017-09-21: qty 2

## 2017-09-21 MED ORDER — MORPHINE SULFATE (PF) 2 MG/ML IV SOLN
2.0000 mg | Freq: Once | INTRAVENOUS | Status: AC
  Administered 2017-09-21: 2 mg via INTRAVENOUS
  Filled 2017-09-21: qty 1

## 2017-09-21 MED ORDER — LEVOTHYROXINE SODIUM 88 MCG PO TABS
88.0000 ug | ORAL_TABLET | Freq: Every day | ORAL | Status: DC
  Administered 2017-09-22 – 2017-09-24 (×3): 88 ug via ORAL
  Filled 2017-09-21 (×3): qty 1

## 2017-09-21 MED ORDER — SODIUM CHLORIDE 0.9 % IV SOLN
INTRAVENOUS | Status: DC
  Administered 2017-09-21: 23:00:00 via INTRAVENOUS

## 2017-09-21 MED ORDER — SULFAMETHOXAZOLE-TRIMETHOPRIM 800-160 MG PO TABS: 1.0000 | ORAL_TABLET | Freq: Two times a day (BID) | ORAL | Status: DC

## 2017-09-21 MED ORDER — LORATADINE 10 MG PO TABS
10.0000 mg | ORAL_TABLET | Freq: Every day | ORAL | Status: DC
  Administered 2017-09-22 – 2017-09-24 (×3): 10 mg via ORAL
  Filled 2017-09-21 (×3): qty 1

## 2017-09-21 MED ORDER — METHOCARBAMOL 500 MG PO TABS
500.0000 mg | ORAL_TABLET | Freq: Four times a day (QID) | ORAL | Status: DC | PRN
  Administered 2017-09-21: 500 mg via ORAL
  Filled 2017-09-21: qty 1

## 2017-09-21 MED ORDER — SENNA 8.6 MG PO TABS
1.0000 | ORAL_TABLET | Freq: Two times a day (BID) | ORAL | Status: DC
  Administered 2017-09-21 – 2017-09-24 (×5): 8.6 mg via ORAL
  Filled 2017-09-21 (×7): qty 1

## 2017-09-21 MED ORDER — LATANOPROST 0.005 % OP SOLN
1.0000 [drp] | Freq: Every day | OPHTHALMIC | Status: DC
  Administered 2017-09-22 – 2017-09-23 (×3): 1 [drp] via OPHTHALMIC
  Filled 2017-09-21: qty 2.5

## 2017-09-21 NOTE — H&P (Addendum)
Stephanie Donovan:[id number] DOB: Sep 19, 1927 DOA: 09/21/2017     PCP: [facility]   Outpatient Specialists:  NONE    Patient arrived to ER on 09/21/17 at 1506  Patient coming from: home Lives alone,     Chief Complaint:  Chief Complaint  Patient presents with  . Fall  . Arm Injury  . Hip Injury    HPI: Stephanie Donovan is a 84 y.o. female with medical history significant of hypothyroidism, TIA, paroxysmal A.fib, diastolic CHF    Presented with   a fall that occurred this morning patient fell out of bed landing on her right side daughter was trying to reach her by phone but patient did not answer so she went to check on her around 1 PM patient was found on the floor unable to get to the phone secondary to right leg pain and was noted to be shortening of the right leg and limited movement of right arm with bruising EMS was called administered 100 mcg of fentanyl initially patient was noted to be hypoxic in the 80s and started 3 L nasal cannula. Patient Is unsure how did she fall but denies hitting her head no associated chest pain shortness of breath abdominal discomfort nausea vomiting or neurological complaints.  Of note few days ago patient has been seen in office for urinary frequency she was diagnosed with possible UTI and started on Bactrim  Pt has plavix on her list of medications but denies ever taking it.   Reports had knee surgery 5 years ago.complicated by delirium.  At baseline no chest pain or shortness of breath able to walk up a flight of stairs  Regarding pertinent Chronic problems: Known history of paroxysmal atrial fibrillation on aspirin history of TIA on baby aspirin prior history of falls with pubic bone fractures   While in ER:  The following Work up has been ordered so far:  Orders Placed This Encounter  Procedures  . Procedural/ Surgical Case Request: ARTHROPLASTY BIPOLAR HIP (HEMIARTHROPLASTY)  . CT Head Wo  Contrast  . CT Cervical Spine Wo Contrast  . DG Hip Unilat W or Wo Pelvis 2-3 Views Right  . DG Shoulder Right  . DG Humerus Right  . DG Elbow Complete Right  . DG Wrist Complete Right  . DG Lumbar Spine Complete  . DG Chest 1 View  . CT SHOULDER RIGHT WO CONTRAST  . Basic metabolic panel  . CBC with Differential  . Urinalysis, Routine w reflex microscopic  . CK  . Vital signs  . Consult to orthopedic surgery  . Consult to hospitalist  . EKG 12-Lead  . ED EKG    Following Medications were ordered in ER: Medications  fentaNYL (SUBLIMAZE) injection 50 mcg (50 mcg Intravenous Given 09/21/17 1601)  sodium chloride 0.9 % bolus 1,000 mL (1,000 mLs Intravenous New Bag/Given 09/21/17 1600)  morphine 2 MG/ML injection 2 mg (2 mg Intravenous Given 09/21/17 1753)    Significant initial  Findings: Abnormal Labs Reviewed  BASIC METABOLIC PANEL - Abnormal; Notable for the following components:      Result Value   Sodium 132 (*)    Glucose, Bld 184 (*)    Calcium 8.8 (*)    GFR calc non Af Amer 53 (*)    All other components within normal limits  CBC WITH DIFFERENTIAL/PLATELET - Abnormal; Notable for the following components:   WBC 14.7 (*)    Neutro Abs 13.3 (*)  Lymphs Abs 0.5 (*)    All other components within normal limits  CK - Abnormal; Notable for the following components:   Total CK 1,197 (*)    All other components within normal limits     Na 132 K 3.9  Cr   Up from baseline see below Lab Results  Component Value Date   CREATININE 0.94 09/21/2017   CREATININE 0.67 07/19/2017   CREATININE 0.65 08/26/2014      WBC  14.7  HG/HCT stable,       Component Value Date/Time   HGB 12.2 09/21/2017 1522   HCT 37.0 09/21/2017 1522   CK 1197    Troponin (Point of Care Test) No results for input(s): TROPIPOC in the last 72 hours.    BNP (last 3 results) No results for input(s): BNP in the last 8760 hours.  ProBNP (last 3 results) No results for input(s): PROBNP  in the last 8760 hours.  Lactic Acid, Venous No results found for: LATICACIDVEN    UA   ordered   CT HEAD  NON acute, Left lobe thyroid nodule CT neck no acute HIP :Acute right femoral neck fracture. Right shoulder : Impacted proximal humeral neck fracture. Right elbow, wrist are negative Lumbar spine: New mild height loss along the right aspect of the L5 superior endplate consistent with age-indeterminate fracture.  CXR - NON acute     ECG:  Personally reviewed by me showing: HR : 116 Rhythm: sinus tachy   no evidence of ischemic changes QTC 455     ED Triage Vitals  Enc Vitals Group     BP 09/21/17 1511 114/65     Pulse Rate 09/21/17 1511 (!) 117     Resp 09/21/17 1515 16     Temp --      Temp src --      SpO2 09/21/17 1510 (!) 80 %     Weight 09/21/17 1515 145 lb (65.8 kg)     Height 09/21/17 1515 5\' 3"  (1.6 m)     Head Circumference --      Peak Flow --      Pain Score 09/21/17 1513 8     Pain Loc --      Pain Edu? --      Excl. in [place]? --   TMAX(24)@       Latest  Blood pressure 133/71, pulse 100, resp. rate 18, height 5\' 3"  (1.6 m), weight 65.8 kg, SpO2 98 %.    ER Provider Called:   Orthopedics  Dr.Varkey  They Recommend to medicine plan to operate in a.m., NPO PMN Will see in AM   Hospitalist was called for admission for acute right femoral neck fracture   Review of Systems:    Pertinent positives include: falls, fatigue, dysuria  Constitutional:  No weight loss, night sweats, Fevers, chills, weight loss  HEENT:  No headaches, Difficulty swallowing,Tooth/dental problems,Sore throat,  No sneezing, itching, ear ache, nasal congestion, post nasal drip,  Cardio-vascular:  No chest pain, Orthopnea, PND, anasarca, dizziness, palpitations.no Bilateral lower extremity swelling  GI:  No heartburn, indigestion, abdominal pain, nausea, vomiting, diarrhea, change in bowel habits, loss of appetite, melena, blood in stool, hematemesis Resp:  no  shortness of breath at rest. No dyspnea on exertion, No excess mucus, no productive cough, No non-productive cough, No coughing up of blood.No change in color of mucus.No wheezing. Skin:  no rash or lesions. No jaundice GU:  no dysuria, change in color of urine, no  urgency or frequency. No straining to urinate.  No flank pain.  Musculoskeletal:  No joint pain or no joint swelling. No decreased range of motion. No back pain.  Psych:  No change in mood or affect. No depression or anxiety. No memory loss.  Neuro: no localizing neurological complaints, no tingling, no weakness, no double vision, no gait abnormality, no slurred speech, no confusion  All systems reviewed and apart from [place] all are negative  Past Medical History:   Past Medical History:  Diagnosis Date  . Blood transfusion without reported diagnosis   . Chronic back pain   . GI bleed   . Glaucoma   . Thyroid disease       Past Surgical History:  Procedure Laterality Date  . COLONOSCOPY    . JOINT REPLACEMENT  11/20/2011   LTKR    Social History:  Ambulatory  independently      reports that she has never smoked. She has never used smokeless tobacco. She reports that she does not drink alcohol or use drugs.     Family History:   Family History  Problem Relation Age of Onset  . Osteoporosis Mother   . Cancer - Colon Father   . Schizophrenia Son     Allergies: Allergies  Allergen Reactions  . Aspirin Palpitations    Mixed reactions Mixed reactions  . Epinephrine Other (See Comments)    Rapped heart beat  . Penicillins     Lost hearing temporarily  . Codeine Palpitations     Prior to Admission medications   Medication Sig Start Date End Date Taking? Authorizing Provider  calcium-vitamin D (OSCAL WITH D) 500-200 MG-UNIT per tablet Take 1 tablet by mouth every morning.   Yes [provider]  cetirizine (ZYRTEC) 10 MG tablet Take 10 mg by mouth daily. 08/17/17  Yes [provider]   diclofenac sodium (VOLTAREN) 1 % GEL Place 2 g onto the skin 4 (four) times daily as needed (for knee pain).  07/12/17  Yes [provider]  latanoprost (XALATAN) 0.005 % ophthalmic solution Place 1 drop into both eyes at bedtime.   Yes [provider]  levothyroxine (SYNTHROID, LEVOTHROID) 88 MCG tablet Take 88 mcg by mouth daily. 08/27/14  Yes [provider]  sulfamethoxazole-trimethoprim (BACTRIM DS,SEPTRA DS) 800-160 MG tablet Take 1 tablet by mouth 2 (two) times daily.  09/19/17 09/22/17 Yes [provider]  timolol (TIMOPTIC) 0.5 % ophthalmic solution Place 1 drop into both eyes 2 (two) times daily.   Yes [provider]  atorvastatin (LIPITOR) 20 MG tablet Take 1 tablet (20 mg total) by mouth daily. Patient not taking: Reported on 09/21/2014 08/26/14   Delfina Redwood, MD  clopidogrel (PLAVIX) 75 MG tablet Take 1 tablet (75 mg total) by mouth daily. Patient not taking: Reported on 09/21/2014 08/26/14   Delfina Redwood, MD  HYDROcodone-acetaminophen [facility]) 5-325 MG per tablet Take 1 tablet by mouth every 4 (four) hours as needed. Patient not taking: Reported on 09/21/2017 09/21/14   Elnora Morrison, MD   Physical Exam: Blood pressure 133/71, pulse 100, resp. rate 18, height 5\' 3"  (1.6 m), weight 65.8 kg, SpO2 98 %. 1. General:  in No Acute distress   Chronically ill  -appearing 2. Psychological: Alert and   Oriented 3. Head/ENT:     Dry Mucous Membranes                          Head Non  traumatic, neck supple                            Poor Dentition 4. SKIN:   decreased Skin turgor,  Skin clean Dry and intact no rash, bruising over right shoulder 5. Heart: Regular rate and rhythm no Murmur, no Rub or gallop 6. Lungs:  Clear to auscultation bilaterally, no wheezes or crackles   7. Abdomen: Soft, non-tender, Non distended  obese   bowel sounds present 8. Lower extremities: no clubbing, cyanosis, or  edema 9. Neurologically Grossly intact, moving  all 4 extremities equally  10. MSK: Normal range of motion, limited due to pain Right leg shortened  LABS:     Recent Labs  Lab 09/21/17 1522  WBC 14.7*  NEUTROABS 13.3*  HGB 12.2  HCT 37.0  MCV 87.9  PLT 979   Basic Metabolic Panel: Recent Labs  Lab 09/21/17 1522  NA 132*  K 3.9  CL 98  CO2 23  GLUCOSE 184*  BUN 15  CREATININE 0.94  CALCIUM 8.8*      No results for input(s): AST, ALT, ALKPHOS, BILITOT, PROT, ALBUMIN in the last 168 hours. No results for input(s): LIPASE, AMYLASE in the last 168 hours. No results for input(s): AMMONIA in the last 168 hours.    HbA1C: No results for input(s): HGBA1C in the last 72 hours. CBG: No results for input(s): GLUCAP in the last 168 hours.    Urine analysis:    Component Value Date/Time   COLORURINE YELLOW 08/25/2014 0454   APPEARANCEUR CLOUDY (A) 08/25/2014 0454   LABSPEC 1.008 08/25/2014 0454   PHURINE 7.5 08/25/2014 [place] 08/25/2014 0454   HGBUR NEGATIVE 08/25/2014 0454   BILIRUBINUR NEGATIVE 08/25/2014 [place] 08/25/2014 0454   PROTEINUR NEGATIVE 08/25/2014 0454   UROBILINOGEN 0.2 08/25/2014 0454   NITRITE NEGATIVE 08/25/2014 0454   LEUKOCYTESUR NEGATIVE 08/25/2014 0454      Cultures:    Component Value Date/Time   SDES URINE, CLEAN CATCH 10/02/2012 1903   SPECREQUEST Normal 10/02/2012 1903   CULT NO GROWTH Performed at [facility] 10/02/2012 1903   REPTSTATUS 10/03/2012 FINAL 10/02/2012 1903     Radiological Exams on Admission: Dg Chest 1 View  Result Date: 09/21/2017 CLINICAL DATA:  Fall. EXAM: CHEST  1 VIEW COMPARISON:  Chest x-ray dated July 19, 2017. FINDINGS: The heart is at the upper limits of normal in size. Normal pulmonary vascularity. Atherosclerotic calcification of the aortic arch. No focal consolidation, pleural effusion, or pneumothorax. Chronic lower thoracic vertebral body compression deformities are unchanged. Partially visualized right  humeral neck fracture. IMPRESSION: 1.  No active cardiopulmonary disease. 2. Right humeral neck fracture. Electronically Signed   By: Titus Dubin M.D.   On: 09/21/2017 17:27   Dg Lumbar Spine Complete  Result Date: 09/21/2017 CLINICAL DATA:  Back pain after fall. EXAM: LUMBAR SPINE - COMPLETE 4+ VIEW COMPARISON:  Lumbar spine x-rays dated January 23, 2014. FINDINGS: New mild height loss along the right aspect of the L5 superior endplate. Chronic mild compression deformities of T10 through T12 are unchanged dating back to chest x-ray from June 2019. Chronic severe L1, mild L2, and moderate L3 compression deformities are unchanged dating back to 2016. Alignment is normal. Mild disc height loss at L4-L5, unchanged. The sacroiliac joints are unremarkable. Aortoiliac atherosclerotic vascular disease. IMPRESSION: 1. New mild height loss along the right aspect of the L5 superior endplate  consistent with age-indeterminate fracture. Correlate with point tenderness. 2. Chronic T10 through L3 compression deformities are not significantly changed. Electronically Signed   By: Titus Dubin M.D.   On: 09/21/2017 17:34   Dg Shoulder Right  Result Date: 09/21/2017 CLINICAL DATA:  Fall. EXAM: RIGHT HUMERUS - 2+ VIEW; RIGHT SHOULDER - 2+ VIEW COMPARISON:  None. FINDINGS: Impacted fracture of the proximal humerus surgical neck. No dislocation. Mild osteoarthritis of the acromioclavicular joint. Osteopenia. Soft tissues are unremarkable. IMPRESSION: Impacted proximal humeral neck fracture. Electronically Signed   By: Titus Dubin M.D.   On: 09/21/2017 17:38   Dg Elbow Complete Right  Result Date: 09/21/2017 CLINICAL DATA:  Fall. EXAM: RIGHT ELBOW - COMPLETE 3+ VIEW COMPARISON:  None. FINDINGS: There is no evidence of fracture, dislocation, or joint effusion. There is no evidence of arthropathy or other focal bone abnormality. Soft tissues are unremarkable. IMPRESSION: Negative. Electronically Signed   By: Titus Dubin M.D.   On: 09/21/2017 17:36   Dg Wrist Complete Right  Result Date: 09/21/2017 CLINICAL DATA:  Fall. EXAM: RIGHT WRIST - COMPLETE 3+ VIEW COMPARISON:  None. FINDINGS: No acute fracture or dislocation. Moderate scaphotrapeziotrapezoid and first [place] joint osteoarthritis. Severe osteopenia. Chondrocalcinosis of the TFCC. Soft tissues are unremarkable. IMPRESSION: 1.  No acute osseous abnormality. Electronically Signed   By: Titus Dubin M.D.   On: 09/21/2017 17:35   Ct Head Wo Contrast  Result Date: 09/21/2017 CLINICAL DATA:  Pt was found on the floor this morning says she fell out of bed about 10am and could not get to the phone EXAM: CT HEAD WITHOUT CONTRAST CT CERVICAL SPINE WITHOUT CONTRAST TECHNIQUE: Multidetector CT imaging of the head and cervical spine was performed following the standard protocol without intravenous contrast. Multiplanar CT image reconstructions of the cervical spine were also generated. COMPARISON:  Head CT 08/25/2014 FINDINGS: CT HEAD FINDINGS Brain: No acute intracranial hemorrhage. No focal mass lesion. No CT evidence of acute infarction. No midline shift or mass effect. No hydrocephalus. Basilar cisterns are patent. There are periventricular and subcortical white matter hypodensities. Generalized cortical atrophy. Vascular: No hyperdense vessel or unexpected calcification. Skull: Normal. Negative for fracture or focal lesion. Sinuses/Orbits: Paranasal sinuses and mastoid air cells are clear. Orbits are clear. Other: None. CT spine findings : Alignment: Normal alignment of vertebral bodies. Skull base and vertebrae: Normal craniocervical junction. No loss of vertebral body height or disc height. Normal facet articulation. No evidence of fracture. Soft tissues and spinal canal: No prevertebral soft tissue swelling. No perispinal or epidural hematoma. Disc levels:  Mild endplate spurring and joint space narrowing. Upper chest: Clear Other: 3.7 cm nodule of the LEFT lobe of  thyroid gland. IMPRESSION: 1. No intracranial trauma. 2. Atrophy and white matter microvascular disease. 3. No cervical spine fracture. 4. Large nodule of the LEFT lobe of thyroid gland. Recommend follow-up thyroid ultrasound for further characterization. Electronically Signed   By: Suzy Bouchard M.D.   On: 09/21/2017 18:15   Ct Cervical Spine Wo Contrast  Result Date: 09/21/2017 CLINICAL DATA:  Pt was found on the floor this morning says she fell out of bed about 10am and could not get to the phone EXAM: CT HEAD WITHOUT CONTRAST CT CERVICAL SPINE WITHOUT CONTRAST TECHNIQUE: Multidetector CT imaging of the head and cervical spine was performed following the standard protocol without intravenous contrast. Multiplanar CT image reconstructions of the cervical spine were also generated. COMPARISON:  Head CT 08/25/2014 FINDINGS: CT HEAD FINDINGS Brain: No acute intracranial  hemorrhage. No focal mass lesion. No CT evidence of acute infarction. No midline shift or mass effect. No hydrocephalus. Basilar cisterns are patent. There are periventricular and subcortical white matter hypodensities. Generalized cortical atrophy. Vascular: No hyperdense vessel or unexpected calcification. Skull: Normal. Negative for fracture or focal lesion. Sinuses/Orbits: Paranasal sinuses and mastoid air cells are clear. Orbits are clear. Other: None. CT spine findings : Alignment: Normal alignment of vertebral bodies. Skull base and vertebrae: Normal craniocervical junction. No loss of vertebral body height or disc height. Normal facet articulation. No evidence of fracture. Soft tissues and spinal canal: No prevertebral soft tissue swelling. No perispinal or epidural hematoma. Disc levels:  Mild endplate spurring and joint space narrowing. Upper chest: Clear Other: 3.7 cm nodule of the LEFT lobe of thyroid gland. IMPRESSION: 1. No intracranial trauma. 2. Atrophy and white matter microvascular disease. 3. No cervical spine fracture. 4.  Large nodule of the LEFT lobe of thyroid gland. Recommend follow-up thyroid ultrasound for further characterization. Electronically Signed   By: Suzy Bouchard M.D.   On: 09/21/2017 18:15   Dg Humerus Right  Result Date: 09/21/2017 CLINICAL DATA:  Fall. EXAM: RIGHT HUMERUS - 2+ VIEW; RIGHT SHOULDER - 2+ VIEW COMPARISON:  None. FINDINGS: Impacted fracture of the proximal humerus surgical neck. No dislocation. Mild osteoarthritis of the acromioclavicular joint. Osteopenia. Soft tissues are unremarkable. IMPRESSION: Impacted proximal humeral neck fracture. Electronically Signed   By: Titus Dubin M.D.   On: 09/21/2017 17:38   Dg Hip Unilat W Or Wo Pelvis 2-3 Views Right  Result Date: 09/21/2017 CLINICAL DATA:  Right hip pain after fall. EXAM: DG HIP (WITH OR WITHOUT PELVIS) 2-3V RIGHT COMPARISON:  Right hip x-rays dated January 23, 2014. FINDINGS: Acute mildly displaced fracture of the right femoral neck. No dislocation. Mild right hip osteoarthritis. Osteopenia. IMPRESSION: Acute right femoral neck fracture. Electronically Signed   By: Titus Dubin M.D.   On: 09/21/2017 17:25    Chart has been reviewed    Assessment/Plan   84 y.o. female with medical history significant of hypothyroidism, TIA, paroxysmal A.fib    Admitted for right femoral neck fracture  Present on Admission:  . Closed displaced fracture of right femoral neck ([place]) -  - management as per orthopedics,  plan to operate   in  a.m.   Keep nothing by mouth post midnight. Patient  not on anticoagulation or antiplatelet agents      Ordered type and screen, Place Foley, order a vitamin D level  Patient at baseline able to walk a flight of stairs or 100 feet       Patient denies any chest pain or shortness of breath currently and/or with exertion,   ECG showing no evidence of acute ischemia    known history of diastolic CHF but currently well compensated Given advanced age patient is at least moderate  risk       .  Closed fracture of right proximal humerus - as per orthopedics, pain managment sling applied  . Dehydration -we will rehydrate with IV fluid  . Hyponatremia - - likely secondary to dehydration, will give IVF, check Urine Na, Cr, Osmolarity. Monitor Na levels to avoid over aggressive correction. Check TSH. Stop offending medications. If no improvement with IVF will initiate further work up for SIADH if appropriate.  . Elevated CK -rehydrate and recheck in a.m.  Mar[place] Kitchen Thyroid nodule -check TSH once acute issues stabilize could benefit from further imaging  . Chronic constipation -chronic restarted bowel management when  able to tolerate p.o. . Hypothyroidism check TSH and T4-3 levels . Paroxysmal A-fib (HCC) -          - CHA2DS2 vas score 5 :  Not on anticoagulation secondary to Risk of Falls?         -  Rate control:  Currently in sinus not on Betablocker at baseline     chronic CHF -  currently appears to be slightly on the dry side, hold home diuretics for tonight and restart when appears euvolemic, carefuly follow fluid status and Cr  Sinus taqchycardia despite IVF - cont with rehydration, and pain management monitor on telemetry.  History of glaucoma continue home medications Other plan as per orders.  Recently diagnosed UTI has been on Septra at home has 1 day still on her antibiotics  given n.p.o. we will switch to IV Cipro  DVT prophylaxis:  SCD    Code Status:  FULL CODE    as per patient   I had personally discussed CODE STATUS with patient      Family Communication:   Family not   at  Bedside    Disposition Plan:   likely will need placement for rehabilitation                     Would benefit from PT/OT eval prior to DC  Defer to Orthopedics                   Swallow eval - SLP ordered                   Social Work  consulted                   Nutrition    consulted                    Consults called: Orthopedics  Admission status:    inpatient     Expect 2 midnight  stay secondary to severity of patient's current illness including hemodynamic instability despite optimal treatment (tachycardia)   I expect  patient will continue meet inpatient criteria for next 2 midnights despite optimal medical management. Patient is at high risk for adverse outcome (such as loss of life or disability) if not treated.      Level of care     tele            Toy Baker 09/21/2017, 10:08 PM    [facility]  Pager [phone]   after 2 AM please page floor coverage PA If 7AM-7PM, please contact the day team taking care of the patient  [email or web address]  Password [email or web address]

## 2017-09-21 NOTE — ED Notes (Signed)
Patient transported to CT 

## 2017-09-21 NOTE — ED Notes (Signed)
Placed pt on purewick  

## 2017-09-21 NOTE — ED Provider Notes (Signed)
[facility] EMERGENCY DEPARTMENT Provider Note   CSN: [id number] Arrival date & time: 09/21/17  1506     History   Chief Complaint Chief Complaint  Patient presents with  . Fall  . Arm Injury  . Hip Injury    HPI Stephanie Donovan is a 84 y.o. female with PMH/o Thyroid disease, Chronic back pain brought in by EMS after a fall.  Patient states that she was attempting to get out of bed this morning at approximately 10 AM when she fell.  Patient is unsure of how she fell but states she did not have any chest pain.  Daughter found her approximately 30 minutes prior to ED arrival after she went over to her house after she had tried calling several times and patient was not answering the phone.  She found patient on the floor next to the bed unable to get up.  Patient states that she did not hit her head does not think she had any LOC.  Patient states that when she fell, she could not get up and put any weight on her leg.  On ED arrival, she is complain of right leg and hip pain and right shoulder and right elbow pain.  Patient states that she is also having some lower back pain.  Patient reports that she had been on aspirin.  On EMS arrival, patient was satting in the 80s on room air.  They placed her on 2 L O2 and she bumped back up to 94%.  Patient was given fentanyl in route.  Patient denies any chest pain, difficulty breathing, abdominal pain, nausea/vomiting, vision changes, numbness of her arms or legs.  The history is provided by the patient.    Past Medical History:  Diagnosis Date  . Blood transfusion without reported diagnosis   . Chronic back pain   . GI bleed   . Glaucoma   . Thyroid disease     Patient Active Problem List   Diagnosis Date Noted  . Paroxysmal A-fib ([place]) 09/21/2017  . Closed displaced fracture of right femoral neck ([place]) 09/21/2017  . Closed fracture of right proximal humerus 09/21/2017  . Dehydration 09/21/2017  . Hyponatremia  09/21/2017  . Elevated CK 09/21/2017  . Thyroid nodule 09/21/2017  . Closed right hip fracture ([place]) 09/21/2017  . Chronic diastolic CHF (congestive heart failure) ([place]) 09/21/2017  . Sinus tachycardia 09/21/2017  . TIA (transient ischemic attack) 08/25/2014  . Aphasia 08/25/2014  . Chronic constipation 05/19/2014  . Fracture of multiple pubic rami ([place]) 04/11/2014  . Pubic bone fracture ([place]) 04/11/2014  . Left hip pain 04/11/2014  . Atrial fibrillation with RVR ([place]) 03/28/2013  . Diarrhea 03/28/2013  . Hypothyroidism 03/24/2013  . Syncope 03/24/2013  . Glaucoma 03/24/2013  . Hypokalemia 03/24/2013  . Acute gastroenteritis 03/24/2013    Past Surgical History:  Procedure Laterality Date  . COLONOSCOPY    . JOINT REPLACEMENT  11/20/2011   LTKR     OB History   None      Home Medications    Prior to Admission medications   Medication Sig Start Date End Date Taking? Authorizing Provider  calcium-vitamin D (OSCAL WITH D) 500-200 MG-UNIT per tablet Take 1 tablet by mouth every morning.   Yes [provider]  cetirizine (ZYRTEC) 10 MG tablet Take 10 mg by mouth daily. 08/17/17  Yes [provider]  diclofenac sodium (VOLTAREN) 1 % GEL Place 2 g onto the skin 4 (four) times daily as  needed (for knee pain).  07/12/17  Yes [provider]  latanoprost (XALATAN) 0.005 % ophthalmic solution Place 1 drop into both eyes at bedtime.   Yes [provider]  levothyroxine (SYNTHROID, LEVOTHROID) 88 MCG tablet Take 88 mcg by mouth daily. 08/27/14  Yes [provider]  sulfamethoxazole-trimethoprim (BACTRIM DS,SEPTRA DS) 800-160 MG tablet Take 1 tablet by mouth 2 (two) times daily.  09/19/17 09/22/17 Yes [provider]  timolol (TIMOPTIC) 0.5 % ophthalmic solution Place 1 drop into both eyes 2 (two) times daily.   Yes [provider]  atorvastatin (LIPITOR) 20 MG tablet Take 1 tablet (20 mg total) by mouth daily. Patient not taking:  Reported on 09/21/2014 08/26/14   Delfina Redwood, MD  clopidogrel (PLAVIX) 75 MG tablet Take 1 tablet (75 mg total) by mouth daily. Patient not taking: Reported on 09/21/2014 08/26/14   Delfina Redwood, MD  HYDROcodone-acetaminophen [facility]) 5-325 MG per tablet Take 1 tablet by mouth every 4 (four) hours as needed. Patient not taking: Reported on 09/21/2017 09/21/14   Elnora Morrison, MD    Family History Family History  Problem Relation Age of Onset  . Osteoporosis Mother   . Cancer - Colon Father   . Schizophrenia Son     Social History Social History   Tobacco Use  . Smoking status: Never Smoker  . Smokeless tobacco: Never Used  Substance Use Topics  . Alcohol use: No  . Drug use: No     Allergies   Aspirin; Epinephrine; Penicillins; and Codeine   Review of Systems Review of Systems  Constitutional: Negative for fever.  Eyes: Negative for visual disturbance.  Respiratory: Negative for shortness of breath.   Cardiovascular: Negative for chest pain.  Gastrointestinal: Negative for abdominal pain, nausea and vomiting.  Genitourinary: Negative for dysuria and hematuria.  Musculoskeletal: Positive for back pain.       Hip Pain Shoulder and elbow pain  Neurological: Negative for weakness, numbness and headaches.  All other systems reviewed and are negative.    Physical Exam Updated Vital Signs BP 119/77   Pulse (!) 119   Resp (!) 23   Ht 5\' 3"  (1.6 m)   Wt 65.8 kg   SpO2 95%   BMI 25.69 kg/m   Physical Exam  Constitutional: She is oriented to person, place, and time. She appears well-developed and well-nourished.  HENT:  Head: Normocephalic and atraumatic.  Mouth/Throat: Oropharynx is clear and moist and mucous membranes are normal.  No tenderness to palpation of skull. No deformities or crepitus noted. No open wounds, abrasions or lacerations.   Eyes: Pupils are equal, round, and reactive to light. Conjunctivae, EOM and lids are normal.  Neck:  Modified  collar in place. Diffuse tenderness to palpation. No step offs or deformities.   Cardiovascular: Normal rate, regular rhythm, normal heart sounds and normal pulses. Exam reveals no gallop and no friction rub.  No murmur heard. Pulses:      Dorsalis pedis pulses are 2+ on the left side.  Difficulty obtaining palpable DP pulse.  DP pulse noted on Doppler.  Pulmonary/Chest: Effort normal and breath sounds normal. She has no decreased breath sounds.  No tenderness to palpation to anterior chest wall. No deformity or crepitus. Lungs clear to ausculation. Able to speak in full sentences without difficulty.   Abdominal: Soft. Normal appearance. There is no tenderness. There is no rigidity and no guarding.  Abdomen is soft, non-distended, non-tender. No rigidity, No guarding. No peritoneal signs.  Musculoskeletal: Normal range of motion.       Thoracic back: She exhibits no tenderness.       Back:  Tenderness palpation noted to the lateral aspect of the right elbow.  Limited flexion/extension secondary to pain.  Tender to palpation noted to the right wrist.  Flexion/extension but with subjective reports of pain.  Limited range of motion of the shoulder.  No tenderness palpation to left shoulder, left elbow, left wrist.  Full range of motion of left upper extremity without any difficulty.  Tenderness palpation to the posterior aspect of the right hip.  The right lower extremity does appear slightly shortened.  No tenderness palpation noted to right knee, right tib-fib, right ankle.  Full range of motion of left lower extremity without any difficulty.  No pelvic instability.  Diffuse tenderness to palpation noted to the entire lumbar region, most notably over the right paraspinal into the gluteal area that extends over to the hip.  Neurological: She is alert and oriented to person, place, and time.  Cranial nerves III-XII intact Follows commands, Moves LUE and LLE without any difficulty.  Difficulty moving  right upper and right lower extremity secondary to pain.  Limited strength of her L ED and RUE secondary to pain. Sensation intact throughout all major nerve distributions No slurred speech. No facial droop.  A&O x 3   Skin: Skin is warm and dry. Capillary refill takes less than 2 seconds.     Good distal cap refill. RLE is not dusky in appearance or cool to touch.  Psychiatric: She has a normal mood and affect. Her speech is normal.  Nursing note and vitals reviewed.   ED Treatments / Results  Labs (all labs ordered are listed, but only abnormal results are displayed) Labs Reviewed  BASIC METABOLIC PANEL - Abnormal; Notable for the following components:      Result Value   Sodium 132 (*)    Glucose, Bld 184 (*)    Calcium 8.8 (*)    GFR calc non Af Amer 53 (*)    All other components within normal limits  CBC WITH DIFFERENTIAL/PLATELET - Abnormal; Notable for the following components:   WBC 14.7 (*)    Neutro Abs 13.3 (*)    Lymphs Abs 0.5 (*)    All other components within normal limits  CK - Abnormal; Notable for the following components:   Total CK 1,197 (*)    All other components within normal limits  URINALYSIS, ROUTINE W REFLEX MICROSCOPIC  TROPONIN I  TROPONIN I  TROPONIN I  CK  SODIUM, URINE, RANDOM  CREATININE, URINE, RANDOM  OSMOLALITY, URINE    EKG EKG Interpretation  Date/Time:  Saturday September 21 2017 15:12:15 EDT Ventricular Rate:  116 PR Interval:    QRS Duration: 98 QT Interval:  327 QTC Calculation: 455 R Axis:   54 Text Interpretation:  Sinus tachycardia Low voltage, precordial leads Confirmed by Lennice Sites [phone]) on 09/21/2017 3:52:43 PM   Radiology Dg Chest 1 View  Result Date: 09/21/2017 CLINICAL DATA:  Fall. EXAM: CHEST  1 VIEW COMPARISON:  Chest x-ray dated July 19, 2017. FINDINGS: The heart is at the upper limits of normal in size. Normal pulmonary vascularity. Atherosclerotic calcification of the aortic arch. No focal  consolidation, pleural effusion, or pneumothorax. Chronic lower thoracic vertebral body compression deformities are unchanged. Partially visualized right humeral neck fracture. IMPRESSION: 1.  No active cardiopulmonary disease. 2. Right humeral neck fracture. Electronically Signed   By: Huntley Dec  Derry M.D.   On: 09/21/2017 17:27   Dg Lumbar Spine Complete  Result Date: 09/21/2017 CLINICAL DATA:  Back pain after fall. EXAM: LUMBAR SPINE - COMPLETE 4+ VIEW COMPARISON:  Lumbar spine x-rays dated January 23, 2014. FINDINGS: New mild height loss along the right aspect of the L5 superior endplate. Chronic mild compression deformities of T10 through T12 are unchanged dating back to chest x-ray from June 2019. Chronic severe L1, mild L2, and moderate L3 compression deformities are unchanged dating back to 2016. Alignment is normal. Mild disc height loss at L4-L5, unchanged. The sacroiliac joints are unremarkable. Aortoiliac atherosclerotic vascular disease. IMPRESSION: 1. New mild height loss along the right aspect of the L5 superior endplate consistent with age-indeterminate fracture. Correlate with point tenderness. 2. Chronic T10 through L3 compression deformities are not significantly changed. Electronically Signed   By: Titus Dubin M.D.   On: 09/21/2017 17:34   Dg Shoulder Right  Result Date: 09/21/2017 CLINICAL DATA:  Fall. EXAM: RIGHT HUMERUS - 2+ VIEW; RIGHT SHOULDER - 2+ VIEW COMPARISON:  None. FINDINGS: Impacted fracture of the proximal humerus surgical neck. No dislocation. Mild osteoarthritis of the acromioclavicular joint. Osteopenia. Soft tissues are unremarkable. IMPRESSION: Impacted proximal humeral neck fracture. Electronically Signed   By: Titus Dubin M.D.   On: 09/21/2017 17:38   Dg Elbow Complete Right  Result Date: 09/21/2017 CLINICAL DATA:  Fall. EXAM: RIGHT ELBOW - COMPLETE 3+ VIEW COMPARISON:  None. FINDINGS: There is no evidence of fracture, dislocation, or joint effusion. There  is no evidence of arthropathy or other focal bone abnormality. Soft tissues are unremarkable. IMPRESSION: Negative. Electronically Signed   By: Titus Dubin M.D.   On: 09/21/2017 17:36   Dg Wrist Complete Right  Result Date: 09/21/2017 CLINICAL DATA:  Fall. EXAM: RIGHT WRIST - COMPLETE 3+ VIEW COMPARISON:  None. FINDINGS: No acute fracture or dislocation. Moderate scaphotrapeziotrapezoid and first [place] joint osteoarthritis. Severe osteopenia. Chondrocalcinosis of the TFCC. Soft tissues are unremarkable. IMPRESSION: 1.  No acute osseous abnormality. Electronically Signed   By: Titus Dubin M.D.   On: 09/21/2017 17:35   Ct Head Wo Contrast  Result Date: 09/21/2017 CLINICAL DATA:  Pt was found on the floor this morning says she fell out of bed about 10am and could not get to the phone EXAM: CT HEAD WITHOUT CONTRAST CT CERVICAL SPINE WITHOUT CONTRAST TECHNIQUE: Multidetector CT imaging of the head and cervical spine was performed following the standard protocol without intravenous contrast. Multiplanar CT image reconstructions of the cervical spine were also generated. COMPARISON:  Head CT 08/25/2014 FINDINGS: CT HEAD FINDINGS Brain: No acute intracranial hemorrhage. No focal mass lesion. No CT evidence of acute infarction. No midline shift or mass effect. No hydrocephalus. Basilar cisterns are patent. There are periventricular and subcortical white matter hypodensities. Generalized cortical atrophy. Vascular: No hyperdense vessel or unexpected calcification. Skull: Normal. Negative for fracture or focal lesion. Sinuses/Orbits: Paranasal sinuses and mastoid air cells are clear. Orbits are clear. Other: None. CT spine findings : Alignment: Normal alignment of vertebral bodies. Skull base and vertebrae: Normal craniocervical junction. No loss of vertebral body height or disc height. Normal facet articulation. No evidence of fracture. Soft tissues and spinal canal: No prevertebral soft tissue swelling. No  perispinal or epidural hematoma. Disc levels:  Mild endplate spurring and joint space narrowing. Upper chest: Clear Other: 3.7 cm nodule of the LEFT lobe of thyroid gland. IMPRESSION: 1. No intracranial trauma. 2. Atrophy and white matter microvascular disease. 3. No cervical spine  fracture. 4. Large nodule of the LEFT lobe of thyroid gland. Recommend follow-up thyroid ultrasound for further characterization. Electronically Signed   By: Suzy Bouchard M.D.   On: 09/21/2017 18:15   Ct Cervical Spine Wo Contrast  Result Date: 09/21/2017 CLINICAL DATA:  Pt was found on the floor this morning says she fell out of bed about 10am and could not get to the phone EXAM: CT HEAD WITHOUT CONTRAST CT CERVICAL SPINE WITHOUT CONTRAST TECHNIQUE: Multidetector CT imaging of the head and cervical spine was performed following the standard protocol without intravenous contrast. Multiplanar CT image reconstructions of the cervical spine were also generated. COMPARISON:  Head CT 08/25/2014 FINDINGS: CT HEAD FINDINGS Brain: No acute intracranial hemorrhage. No focal mass lesion. No CT evidence of acute infarction. No midline shift or mass effect. No hydrocephalus. Basilar cisterns are patent. There are periventricular and subcortical white matter hypodensities. Generalized cortical atrophy. Vascular: No hyperdense vessel or unexpected calcification. Skull: Normal. Negative for fracture or focal lesion. Sinuses/Orbits: Paranasal sinuses and mastoid air cells are clear. Orbits are clear. Other: None. CT spine findings : Alignment: Normal alignment of vertebral bodies. Skull base and vertebrae: Normal craniocervical junction. No loss of vertebral body height or disc height. Normal facet articulation. No evidence of fracture. Soft tissues and spinal canal: No prevertebral soft tissue swelling. No perispinal or epidural hematoma. Disc levels:  Mild endplate spurring and joint space narrowing. Upper chest: Clear Other: 3.7 cm nodule of  the LEFT lobe of thyroid gland. IMPRESSION: 1. No intracranial trauma. 2. Atrophy and white matter microvascular disease. 3. No cervical spine fracture. 4. Large nodule of the LEFT lobe of thyroid gland. Recommend follow-up thyroid ultrasound for further characterization. Electronically Signed   By: Suzy Bouchard M.D.   On: 09/21/2017 18:15   Dg Humerus Right  Result Date: 09/21/2017 CLINICAL DATA:  Fall. EXAM: RIGHT HUMERUS - 2+ VIEW; RIGHT SHOULDER - 2+ VIEW COMPARISON:  None. FINDINGS: Impacted fracture of the proximal humerus surgical neck. No dislocation. Mild osteoarthritis of the acromioclavicular joint. Osteopenia. Soft tissues are unremarkable. IMPRESSION: Impacted proximal humeral neck fracture. Electronically Signed   By: Titus Dubin M.D.   On: 09/21/2017 17:38   Dg Hip Unilat W Or Wo Pelvis 2-3 Views Right  Result Date: 09/21/2017 CLINICAL DATA:  Right hip pain after fall. EXAM: DG HIP (WITH OR WITHOUT PELVIS) 2-3V RIGHT COMPARISON:  Right hip x-rays dated January 23, 2014. FINDINGS: Acute mildly displaced fracture of the right femoral neck. No dislocation. Mild right hip osteoarthritis. Osteopenia. IMPRESSION: Acute right femoral neck fracture. Electronically Signed   By: Titus Dubin M.D.   On: 09/21/2017 17:25    Procedures Procedures (including critical care time)  Medications Ordered in ED Medications  sodium chloride 0.9 % bolus 500 mL (has no administration in time range)  fentaNYL (SUBLIMAZE) injection 50 mcg (50 mcg Intravenous Given 09/21/17 1601)  sodium chloride 0.9 % bolus 1,000 mL (0 mLs Intravenous Stopped 09/21/17 1900)  morphine 2 MG/ML injection 2 mg (2 mg Intravenous Given 09/21/17 1753)     Initial Impression / Assessment and Plan / ED Course  I have reviewed the triage vital signs and the nursing notes.  Pertinent labs & imaging results that were available during my care of the patient were reviewed by me and considered in my medical decision making  (see chart for details).     84 y.o. F past medical history of A. fib, thyroid issues who presents for evaluation of mechanical fall,  right lower and upper extremity pain.  Patient was getting out of bed when she fell.  This was said to have happened at approximately 10 AM.  Daughter found her 30 minutes prior to ED arrival after she had not been answering her phone.  Patient states she is unsure why she fell but did not have any chest pain.  Was recently on antibiotics for UTI.  Has not been able to ambulate or bear weight since incident.  Patient reports she is currently not on any blood thinners.  She does report that she had been on aspirin but they took her off of it.  Review of her records show that she has been on aspirin and Plavix.  On initial EMS arrival, patient O2 sats were in the 80s.  She was placed on 2 L O2 with improvement.  Patient on any of no difficulty breathing. Patient is afebrile, non-toxic appearing. Vital signs reviewed and stable.  Concern for right upper extremity fracture, right lower extremity fracture.  We will plan to obtain imaging of both right upper and lower extremity.  Additionally, given concerns of fall, will plan for CT head, CT C-spine, chest x-ray. IVF and analgesics provided.   Hip XR shows acute mildly displaced femoral neck fracture. XR of Right humerus shows impacted fracture of proximal surgical neck.  XR of elbow and wrist negative.   CT head negative for any acute intracranial abnormality. CT C spine negative for any acute fracture. There is mention of incidental thyroid nodule.   Discussed patient with Dr. Griffin Basil (Ortho). Agrees with plan for admission, though given patient's age and medical conditions, request medical admission. Will plan for surgery tomorrow. Plan for NPO after midnight.   Discussed patient with Hospitalist. Will admit.   Final Clinical Impressions(s) / ED Diagnoses   Final diagnoses:  Closed fracture of neck of right femur,  initial encounter ([place])  Closed fracture of proximal end of left humerus, unspecified fracture morphology, initial encounter    ED Discharge Orders    None       Desma Mcgregor 09/21/17 2020    Lennice Sites, DO 09/22/17 [id number]

## 2017-09-21 NOTE — ED Triage Notes (Addendum)
Per [facility] pt coming from home where she lives alone. Daughter went to check on pt after not answering phone and found pt on ground. Pt states she fell out of the bed around 10am today but unable to move to get to phone. Pt has shortening to right leg and right arm limited movement with bruising. Given 129mcg fent en route. Pt A&0x4.  Patient intial oxygen saturation in the 80s, placed on 3L [place]. Pt denies any difficulty breathing.

## 2017-09-21 NOTE — ED Notes (Signed)
Unsuccessful foley placement attempt.

## 2017-09-22 ENCOUNTER — Inpatient Hospital Stay (HOSPITAL_COMMUNITY): Payer: Medicare Other

## 2017-09-22 ENCOUNTER — Inpatient Hospital Stay (HOSPITAL_COMMUNITY): Payer: Medicare Other | Admitting: Anesthesiology

## 2017-09-22 ENCOUNTER — Encounter (HOSPITAL_COMMUNITY)
Admission: EM | Disposition: A | Discharge status: Skilled Nursing Facility | Payer: Self-pay | Source: Home / Self Care | Attending: Internal Medicine

## 2017-09-22 ENCOUNTER — Encounter (HOSPITAL_COMMUNITY): Payer: Self-pay

## 2017-09-22 DIAGNOSIS — S72001A Fracture of unspecified part of neck of right femur, initial encounter for closed fracture: Principal | ICD-10-CM

## 2017-09-22 HISTORY — PX: HIP ARTHROPLASTY: SHX981

## 2017-09-22 LAB — URINALYSIS, ROUTINE W REFLEX MICROSCOPIC
BACTERIA UA: NONE SEEN
BILIRUBIN URINE: NEGATIVE
Glucose, UA: NEGATIVE mg/dL
HGB URINE DIPSTICK: NEGATIVE
KETONES UR: 5 mg/dL — AB
LEUKOCYTES UA: NEGATIVE
NITRITE: NEGATIVE
PROTEIN: 30 mg/dL — AB
Specific Gravity, Urine: 1.018 (ref 1.005–1.030)
pH: 6 (ref 5.0–8.0)

## 2017-09-22 LAB — BASIC METABOLIC PANEL
ANION GAP: 6 (ref 5–15)
BUN: 13 mg/dL (ref 8–23)
CALCIUM: 6.9 mg/dL — AB (ref 8.9–10.3)
CO2: 22 mmol/L (ref 22–32)
Chloride: 111 mmol/L (ref 98–111)
Creatinine, Ser: 0.62 mg/dL (ref 0.44–1.00)
Glucose, Bld: 89 mg/dL (ref 70–99)
POTASSIUM: 3.1 mmol/L — AB (ref 3.5–5.1)
Sodium: 139 mmol/L (ref 135–145)

## 2017-09-22 LAB — CBC
HEMATOCRIT: 29.8 % — AB (ref 36.0–46.0)
Hemoglobin: 9.9 g/dL — ABNORMAL LOW (ref 12.0–15.0)
MCH: 30 pg (ref 26.0–34.0)
MCHC: 33.2 g/dL (ref 30.0–36.0)
MCV: 90.3 fL (ref 78.0–100.0)
PLATELETS: 141 10*3/uL — AB (ref 150–400)
RBC: 3.3 MIL/uL — AB (ref 3.87–5.11)
RDW: 13.6 % (ref 11.5–15.5)
WBC: 11.9 10*3/uL — AB (ref 4.0–10.5)

## 2017-09-22 LAB — SURGICAL PCR SCREEN
MRSA, PCR: NEGATIVE
STAPHYLOCOCCUS AUREUS: NEGATIVE

## 2017-09-22 LAB — CK: Total CK: 1160 U/L — ABNORMAL HIGH (ref 38–234)

## 2017-09-22 LAB — T4, FREE: FREE T4: 1.24 ng/dL (ref 0.82–1.77)

## 2017-09-22 LAB — TROPONIN I
TROPONIN I: 0.03 ng/mL — AB (ref <0.03)
Troponin I: 0.03 ng/mL (ref <0.03)

## 2017-09-22 LAB — TSH: TSH: 0.481 u[IU]/mL (ref 0.350–4.500)

## 2017-09-22 LAB — ALBUMIN: Albumin: 2.4 g/dL — ABNORMAL LOW (ref 3.5–5.0)

## 2017-09-22 SURGERY — HEMIARTHROPLASTY, HIP, DIRECT ANTERIOR APPROACH, FOR FRACTURE
Anesthesia: General | Site: Hip | Laterality: Right

## 2017-09-22 MED ORDER — ROCURONIUM BROMIDE 10 MG/ML (PF) SYRINGE
PREFILLED_SYRINGE | INTRAVENOUS | Status: DC | PRN
  Administered 2017-09-22: 40 mg via INTRAVENOUS
  Administered 2017-09-22: 20 mg via INTRAVENOUS

## 2017-09-22 MED ORDER — VANCOMYCIN HCL 1000 MG IV SOLR
INTRAVENOUS | Status: AC
  Filled 2017-09-22: qty 1000

## 2017-09-22 MED ORDER — METOCLOPRAMIDE HCL 5 MG/ML IJ SOLN: 5.0000 mg | Freq: Three times a day (TID) | INTRAMUSCULAR | Status: DC | PRN

## 2017-09-22 MED ORDER — ACETAMINOPHEN 500 MG PO TABS
1000.0000 mg | ORAL_TABLET | Freq: Three times a day (TID) | ORAL | Status: DC
  Administered 2017-09-23 – 2017-09-24 (×6): 1000 mg via ORAL
  Filled 2017-09-22 (×6): qty 2

## 2017-09-22 MED ORDER — ONDANSETRON HCL 4 MG/2ML IJ SOLN
INTRAMUSCULAR | Status: DC | PRN
  Administered 2017-09-22: 4 mg via INTRAVENOUS

## 2017-09-22 MED ORDER — DEXAMETHASONE SODIUM PHOSPHATE 10 MG/ML IJ SOLN
INTRAMUSCULAR | Status: DC | PRN
  Administered 2017-09-22: 10 mg via INTRAVENOUS

## 2017-09-22 MED ORDER — PROPOFOL 10 MG/ML IV BOLUS
INTRAVENOUS | Status: DC | PRN
  Administered 2017-09-22: 80 mg via INTRAVENOUS

## 2017-09-22 MED ORDER — VANCOMYCIN HCL 1 G IV SOLR
INTRAVENOUS | Status: DC | PRN
  Administered 2017-09-22: 1000 mg

## 2017-09-22 MED ORDER — CHLORHEXIDINE GLUCONATE 4 % EX LIQD: 60.0000 mL | Freq: Once | CUTANEOUS | Status: DC

## 2017-09-22 MED ORDER — ENOXAPARIN SODIUM 40 MG/0.4ML ~~LOC~~ SOLN
40.0000 mg | SUBCUTANEOUS | Status: DC
  Administered 2017-09-23 – 2017-09-24 (×2): 40 mg via SUBCUTANEOUS
  Filled 2017-09-22 (×3): qty 0.4

## 2017-09-22 MED ORDER — SUGAMMADEX SODIUM 200 MG/2ML IV SOLN
INTRAVENOUS | Status: DC | PRN
  Administered 2017-09-22: 150 mg via INTRAVENOUS

## 2017-09-22 MED ORDER — FENTANYL CITRATE (PF) 100 MCG/2ML IJ SOLN
25.0000 ug | INTRAMUSCULAR | Status: DC | PRN
  Administered 2017-09-22 (×2): 25 ug via INTRAVENOUS

## 2017-09-22 MED ORDER — SODIUM CHLORIDE 0.9 % IV SOLN
INTRAVENOUS | Status: DC
  Administered 2017-09-22 – 2017-09-23 (×3): via INTRAVENOUS

## 2017-09-22 MED ORDER — DOCUSATE SODIUM 100 MG PO CAPS
100.0000 mg | ORAL_CAPSULE | Freq: Two times a day (BID) | ORAL | Status: DC
  Administered 2017-09-22 – 2017-09-24 (×3): 100 mg via ORAL
  Filled 2017-09-22 (×5): qty 1

## 2017-09-22 MED ORDER — POTASSIUM CHLORIDE 10 MEQ/100ML IV SOLN
10.0000 meq | INTRAVENOUS | Status: AC
  Administered 2017-09-22 (×2): 10 meq via INTRAVENOUS
  Filled 2017-09-22 (×5): qty 100

## 2017-09-22 MED ORDER — PROPOFOL 10 MG/ML IV BOLUS
INTRAVENOUS | Status: AC
  Filled 2017-09-22: qty 20

## 2017-09-22 MED ORDER — 0.9 % SODIUM CHLORIDE (POUR BTL) OPTIME
TOPICAL | Status: DC | PRN
  Administered 2017-09-22: 1000 mL

## 2017-09-22 MED ORDER — FENTANYL CITRATE (PF) 250 MCG/5ML IJ SOLN
INTRAMUSCULAR | Status: AC
  Filled 2017-09-22: qty 5

## 2017-09-22 MED ORDER — FENTANYL CITRATE (PF) 100 MCG/2ML IJ SOLN
INTRAMUSCULAR | Status: AC
  Administered 2017-09-22: 25 ug via INTRAVENOUS
  Filled 2017-09-22: qty 2

## 2017-09-22 MED ORDER — LACTATED RINGERS IV SOLN
INTRAVENOUS | Status: DC
  Administered 2017-09-22 (×2): via INTRAVENOUS

## 2017-09-22 MED ORDER — CLINDAMYCIN PHOSPHATE 900 MG/50ML IV SOLN
900.0000 mg | INTRAVENOUS | Status: AC
  Administered 2017-09-22: 900 mg via INTRAVENOUS
  Filled 2017-09-22: qty 50

## 2017-09-22 MED ORDER — LIDOCAINE 2% (20 MG/ML) 5 ML SYRINGE
INTRAMUSCULAR | Status: DC | PRN
  Administered 2017-09-22: 40 mg via INTRAVENOUS

## 2017-09-22 MED ORDER — OXYCODONE HCL 5 MG PO TABS: 5.0000 mg | ORAL_TABLET | Freq: Once | ORAL | Status: DC | PRN

## 2017-09-22 MED ORDER — FENTANYL CITRATE (PF) 250 MCG/5ML IJ SOLN
INTRAMUSCULAR | Status: DC | PRN
  Administered 2017-09-22 (×2): 50 ug via INTRAVENOUS
  Administered 2017-09-22: 100 ug via INTRAVENOUS
  Administered 2017-09-22: 50 ug via INTRAVENOUS

## 2017-09-22 MED ORDER — ACETAMINOPHEN 10 MG/ML IV SOLN
INTRAVENOUS | Status: AC
  Filled 2017-09-22: qty 100

## 2017-09-22 MED ORDER — MUPIROCIN 2 % EX OINT: 1.0000 "application " | TOPICAL_OINTMENT | Freq: Two times a day (BID) | CUTANEOUS | Status: DC

## 2017-09-22 MED ORDER — ACETAMINOPHEN 10 MG/ML IV SOLN
INTRAVENOUS | Status: DC | PRN
  Administered 2017-09-22: 1000 mg via INTRAVENOUS

## 2017-09-22 MED ORDER — OXYCODONE HCL 5 MG/5ML PO SOLN: 5.0000 mg | Freq: Once | ORAL | Status: DC | PRN

## 2017-09-22 MED ORDER — ALBUMIN HUMAN 5 % IV SOLN
INTRAVENOUS | Status: DC | PRN
  Administered 2017-09-22: 17:00:00 via INTRAVENOUS

## 2017-09-22 MED ORDER — CLINDAMYCIN PHOSPHATE 600 MG/50ML IV SOLN
600.0000 mg | Freq: Four times a day (QID) | INTRAVENOUS | Status: AC
  Administered 2017-09-22 – 2017-09-23 (×2): 600 mg via INTRAVENOUS
  Filled 2017-09-22 (×2): qty 50

## 2017-09-22 MED ORDER — MENTHOL 3 MG MT LOZG: 1.0000 | LOZENGE | OROMUCOSAL | Status: DC | PRN

## 2017-09-22 MED ORDER — OXYCODONE HCL 5 MG PO TABS: 5.0000 mg | ORAL_TABLET | ORAL | Status: DC | PRN

## 2017-09-22 MED ORDER — PHENYLEPHRINE HCL 10 MG/ML IJ SOLN
INTRAMUSCULAR | Status: DC | PRN
  Administered 2017-09-22 (×2): 80 ug via INTRAVENOUS

## 2017-09-22 MED ORDER — METOCLOPRAMIDE HCL 5 MG PO TABS: 5.0000 mg | ORAL_TABLET | Freq: Three times a day (TID) | ORAL | Status: DC | PRN

## 2017-09-22 MED ORDER — PHENOL 1.4 % MT LIQD: 1.0000 | OROMUCOSAL | Status: DC | PRN

## 2017-09-22 SURGICAL SUPPLY — 53 items
BLADE SAGITTAL 25.0X1.27X90 (BLADE) ×2 IMPLANT
CEMENT BONE SIMPLEX SPEEDSET (Cement) ×4 IMPLANT
CEMENT RESTRICTOR BONE PREP ST (KITS) ×2 IMPLANT
CHLORAPREP W/TINT 26ML (MISCELLANEOUS) ×4 IMPLANT
CLSR STERI-STRIP ANTIMIC 1/2X4 (GAUZE/BANDAGES/DRESSINGS) ×2 IMPLANT
COVER SURGICAL LIGHT HANDLE (MISCELLANEOUS) ×2 IMPLANT
DRAPE INCISE IOBAN 66X45 STRL (DRAPES) ×2 IMPLANT
DRAPE ORTHO SPLIT 77X108 STRL (DRAPES) ×2
DRAPE SURG ORHT 6 SPLT 77X108 (DRAPES) ×2 IMPLANT
DRAPE U-SHAPE 47X51 STRL (DRAPES) ×2 IMPLANT
DRSG AQUACEL AG ADV 3.5X 6 (GAUZE/BANDAGES/DRESSINGS) ×2 IMPLANT
ELECT BLADE 4.0 EZ CLEAN MEGAD (MISCELLANEOUS) ×2
ELECT CAUTERY BLADE 6.4 (BLADE) ×2 IMPLANT
ELECT REM PT RETURN 9FT ADLT (ELECTROSURGICAL) ×2
ELECTRODE BLDE 4.0 EZ CLN MEGD (MISCELLANEOUS) ×1 IMPLANT
ELECTRODE REM PT RTRN 9FT ADLT (ELECTROSURGICAL) ×1 IMPLANT
GLOVE BIO SURGEON STRL SZ8 (GLOVE) ×4 IMPLANT
GLOVE BIOGEL PI IND STRL 8 (GLOVE) ×1 IMPLANT
GLOVE BIOGEL PI INDICATOR 8 (GLOVE) ×1
GLOVE BIOGEL PI ORTHO PRO SZ8 (GLOVE) ×1
GLOVE PI ORTHO PRO STRL SZ8 (GLOVE) ×1 IMPLANT
GOWN STRL REUS W/ TWL LRG LVL3 (GOWN DISPOSABLE) IMPLANT
GOWN STRL REUS W/ TWL XL LVL3 (GOWN DISPOSABLE) ×2 IMPLANT
GOWN STRL REUS W/TWL 2XL LVL3 (GOWN DISPOSABLE) ×2 IMPLANT
GOWN STRL REUS W/TWL LRG LVL3 (GOWN DISPOSABLE)
GOWN STRL REUS W/TWL XL LVL3 (GOWN DISPOSABLE) ×2
HEAD MODULAR ENDO (Orthopedic Implant) ×1 IMPLANT
HEAD UNPLR 45XMDLR STRL HIP (Orthopedic Implant) ×1 IMPLANT
KIT BASIN OR (CUSTOM PROCEDURE TRAY) ×2 IMPLANT
KIT TURNOVER KIT B (KITS) ×2 IMPLANT
MANIFOLD NEPTUNE II (INSTRUMENTS) ×2 IMPLANT
NEEDLE MAYO TROCAR (NEEDLE) ×2 IMPLANT
PACK TOTAL JOINT (CUSTOM PROCEDURE TRAY) ×2 IMPLANT
PAD ARMBOARD 7.5X6 YLW CONV (MISCELLANEOUS) ×4 IMPLANT
PILLOW ABDUCTION HIP (SOFTGOODS) ×2 IMPLANT
RETRIEVER SUT HEWSON (MISCELLANEOUS) ×2 IMPLANT
SLEEVE UNITRAX V40 STD (Orthopedic Implant) ×2 IMPLANT
SPACER OSTEO CEMENT (Spacer) ×1 IMPLANT
SPACER OSTEO CEMENT 10HIP (Spacer) ×1 IMPLANT
STAPLER VISISTAT 35W (STAPLE) IMPLANT
STEM HIP ACCOLADE SZ5 37X145 (Stem) ×2 IMPLANT
SUT FIBERWIRE #2 38 REV NDL BL (SUTURE)
SUT FIBERWIRE #2 38 T-5 BLUE (SUTURE) ×4
SUT MON AB 3-0 SH 27 (SUTURE)
SUT MON AB 3-0 SH27 (SUTURE) IMPLANT
SUT VIC AB 0 CT1 27 (SUTURE) ×2
SUT VIC AB 0 CT1 27XBRD ANBCTR (SUTURE) ×2 IMPLANT
SUT VIC AB 2-0 CT1 27 (SUTURE) ×2
SUT VIC AB 2-0 CT1 TAPERPNT 27 (SUTURE) ×2 IMPLANT
SUTURE FIBERWR #2 38 T-5 BLUE (SUTURE) ×2 IMPLANT
SUTURE FIBERWR#2 38 REV NDL BL (SUTURE) IMPLANT
TOWER CARTRIDGE SMART MIX (DISPOSABLE) ×2 IMPLANT
TRAY FOLEY W/BAG SLVR 14FR (SET/KITS/TRAYS/PACK) IMPLANT

## 2017-09-22 NOTE — Plan of Care (Signed)
  Problem: Education: Goal: Verbalization of understanding the information provided (i.e., activity precautions, restrictions, etc) will improve Outcome: Progressing   Problem: Activity: Goal: Ability to ambulate and perform ADLs will improve Outcome: Progressing   Problem: Pain Management: Goal: Pain level will decrease Outcome: Progressing   Problem: Activity: Goal: Risk for activity intolerance will decrease Outcome: Progressing   Problem: Safety: Goal: Ability to remain free from injury will improve Outcome: Progressing   Problem: Skin Integrity: Goal: Risk for impaired skin integrity will decrease Outcome: Progressing   

## 2017-09-22 NOTE — Anesthesia Preprocedure Evaluation (Addendum)
Anesthesia Evaluation  Patient identified by MRN, date of birth, ID band Patient awake    Reviewed: Allergy & Precautions, NPO status , Patient's Chart, lab work & pertinent test results  History of Anesthesia Complications Negative for: history of anesthetic complications  Airway Mallampati: II  TM Distance: >3 FB Neck ROM: Full    Dental  (+) Dental Advisory Given   Pulmonary neg pulmonary ROS,    breath sounds clear to auscultation       Cardiovascular (-) angina+CHF  + dysrhythmias Atrial Fibrillation  Rhythm:Regular     Neuro/Psych TIAnegative psych ROS   GI/Hepatic negative GI ROS, Neg liver ROS,   Endo/Other  Hypothyroidism   Renal/GU negative Renal ROS     Musculoskeletal   Abdominal   Peds  Hematology   Anesthesia Other Findings 8/16 TTE: Left ventricle: The cavity size was normal. Systolic function was   normal. The estimated ejection fraction was in the range of 55%   to 60%. Wall motion was normal; there were no regional wall   motion abnormalities. Doppler parameters are consistent with   abnormal left ventricular relaxation (grade 1 diastolic   dysfunction). - Aortic valve: There was trivial regurgitation.  Reproductive/Obstetrics                            Anesthesia Physical Anesthesia Plan  ASA: II  Anesthesia Plan: General   Post-op Pain Management:    Induction: Intravenous  PONV Risk Score and Plan: 3 and Ondansetron and Dexamethasone  Airway Management Planned: Oral ETT  Additional Equipment: None  Intra-op Plan:   Post-operative Plan: Extubation in OR  Informed Consent: I have reviewed the patients History and Physical, chart, labs and discussed the procedure including the risks, benefits and alternatives for the proposed anesthesia with the patient or authorized representative who has indicated his/her understanding and acceptance.   Dental advisory  given  Plan Discussed with: CRNA and Surgeon  Anesthesia Plan Comments:         Anesthesia Quick Evaluation

## 2017-09-22 NOTE — Op Note (Signed)
Orthopaedic Surgery Operative Note (CSN: [id number])  Stephanie Donovan  06/29/1927 Date of Surgery: 09/21/2017 - 09/22/2017   Diagnoses:  right femoral neck fracture  Procedure: Right hip cemented hemiarthroplasty   Operative Finding Successful completion of planned procedure.  Stable in adduction, flexion to 60 deg.  No shuck.  Good length.  Had to cement due to patchulous canal.  Post-operative plan: The patient will be WBAT w posterior hip precautions.  The patient will be readmitted to floor.  DVT prophylaxis lovenox x6 weeks.  Pain control with PRN pain medication preferring oral medicines.  Follow up plan will be scheduled in approximately 14 days for incision check and XR.  Post-Op Diagnosis: Same Surgeons:Primary: [name], MD Assistants:Brandon Lynnell Jude Location: [facility] OR ROOM 06 Anesthesia: General Antibiotics: Ancef 2g preop, Vancomycin 1000mg  locally  Tourniquet time: * No tourniquets in log * Estimated Blood Loss: 751 Complications: None Specimens: None Implants: Implant Name Type Inv. Item Serial No. Manufacturer Lot No. LRB No. Used Action  CEMENT BONE SIMPLEX SPEEDSET - [id number] Cement CEMENT BONE SIMPLEX SPEEDSET  [employer] [id number] Right 1 Implanted  CEMENT BONE SIMPLEX SPEEDSET - [id number] Cement CEMENT BONE SIMPLEX SPEEDSET  [employer] [id number] Right 1 Implanted  Cemented Hip Stem 127 Degree Stem    [id number] Right 1 Implanted  HEAD MODULAR ENDO - [id number] Orthopedic Implant HEAD MODULAR ENDO  [employer] [id number] Right 1 Implanted  SPACER OSTEO CEMENT - [id number] Spacer SPACER OSTEO CEMENT  [employer] [id number] Right 1 Implanted  SLEEVE UNITRAX V40 STD - [id number] Orthopedic Implant SLEEVE UNITRAX V40 STD  [employer] [id number] Right 1 Implanted    Indications for Surgery:   Stephanie Donovan is a 84 y.o. female with fall from bed resulting in Right hip and humerus fractures.  Humerus to be managed ideally non-op but hip  had recommended operative management. Benefits and risks of operative and nonoperative management were discussed prior to surgery with patient/guardian(s) and informed consent form was completed.  Specific risks including infection, need for additional surgery, periprosthetic fracture, instability, DVT.   Procedure:   The patient was identified in the preoperative holding area where the surgical site was marked. The patient was taken to the OR where a procedural timeout was called and the above noted anesthesia was induced.  The patient was positioned lateral with mark 2 positioner.  Preoperative antibiotics were dosed.  The patient's right hip was prepped and draped in the usual sterile fashion.  A second preoperative timeout was called.      We made an incision centered over the greater trochanter with a scalpel. We used the scalpel to continue to dissect to the fascia. The fascia was pierced with Bovie electrocautery. Mayo scissors were used to cut the fascia in a longitudinal fashion. The gluteus maximus fibers were bluntly split in line with their fibers. The femur was slowly internally rotated, putting tension on the posterior structures. Bovie electrocautery was used to dissect the short external rotators off of the insertion onto the femur. After the short external rotators were transected, we visualized the femoral neck fracture. We identified the sciatic nerve by palpation and verified that it was not in danger from dissection.   We made a T-shaped capsulotomy. The fracture was mid cervical and we used a saw while protecting the lesser and the greater trochanter to make an appropriate cut.  We used the box cutter to cut away some of the greater trochanter for ease of insertion of the stem.  We then used the canal finder to locate the femoral canal. Using the angle of the femoral neck as our guide for version, we broached sequentially. We trialed components and found appropriate fit and stability.   The patient would come to full extension of the hip. Due to the poor bone quality and patient factors including fracture type we elected to cement.  We prepared for cement and placed a cement restrictor using the stem as guidance before placing the real stem in place with typical cement technique.  Once 20 minutes of time to set the cement had passed with trialed the hip.  The hip was stable at 90, 20 adduction and 0 IR.  We opened a 0 neck length and We then reduced the hip. Again, that was stable in the previously mentioned manipulations. We irrigated copiously.  Vancomycin powder was placed in the wound.  We repaired the posterior capsule. Short external rotators repaired to the greater trochanter. We closed the fascia of the iliotibial band and gluteus maximus with running and intterupted Vicryl sutures. We then closed Scarpa's fascia with running Vicryl sutures. Skin was closed with  absorbable Monocryl in layers and Aquasol dressing was placed.  The patient was awoken from general anesthesia and taken to the PACU in stable condition without complication.    Joya Gaskins, OPA-C, present and scrubbed throughout the case, critical for completion in a timely fashion, and for retraction, instrumentation, closure.

## 2017-09-22 NOTE — Progress Notes (Signed)
PROGRESS NOTE    Stephanie Donovan  YQI:[id number] DOB: Apr 28, 1927 DOA: 09/21/2017 PCP: [facility]   Brief Narrative: Patient is 84 year old female with past medical history of hypothyroidism, TIA, paroxysmal A. fib, diastolic CHF who presented from home to the emergency department after she fell from her bed.  Imagings done on presentation showed close displaced fracture of right femoral neck and closed fracture of right proximal humerus.  Orthopedics consulted.  Planning for right hemiarthroplasty today.  Assessment & Plan:   Active Problems:   Hypothyroidism   Chronic constipation   Paroxysmal A-fib (HCC)   Closed displaced fracture of right femoral neck (HCC)   Closed fracture of right proximal humerus   Dehydration   Hyponatremia   Elevated CK   Thyroid nodule   Closed right hip fracture (HCC)   Chronic diastolic CHF (congestive heart failure) (HCC)   Sinus tachycardia   Acute lower UTI  Closed displaced fracture of right femoral neck/Impacted proximal humeral neck fracture.: Undergoing right-sided hip hemiarthroplasty today by orthopedic surgery.  Plan for conservative management for the fracture of the humerus.  Currently she has a sling. PT/OT after the surgery.  DVT prophylaxis.  Pain management.  Patient might need rehab after surgery.  Hyponatremia: Resolved  Elevated CK: Most likely secondary to fall.  Continue IV fluids  Thyroid nodule: TSH normal. CT cervical spine showed large nodule of the LEFT lobe of thyroid gland. [place] follow-up as an outpatient.  Further investigation of this large thyroid nodule in this 84 year old female is questionable.  Hypothyroidism: Continue Synthyroid.  Paroxysmal A. fib: Not on anticoagulation most likely secondary to advanced age and risk for falls.  Currently rate is controlled.  Not on beta-blockers.  Chronic CHF: Currently compensated.  Appeared dry on presentation and was given IV  fluids.  History of glaucoma: Continue home meds.  History of recent UTI: On oral antibiotics at home.  Was on day 1.Switched to IV here.  UA done here was not impressive of UTI.  Hypokalemia: Being supplemented.   DVT prophylaxis: SCD Code Status: Full Family Communication: None present at the bedside Disposition Plan: Awaiting surgery.  Needs to be determined   Consultants: Ortho  Procedures: None  Antimicrobials: None  Subjective: Patient seen and examined at bedside this morning.  Currently stable.  Remains comfortable.  Pain is well controlled  Objective: Vitals:   09/21/17 1930 09/21/17 2000 09/21/17 2103 09/22/17 0441  BP: 121/69 119/77 (!) 142/69 111/71  Pulse: (!) 119 (!) 119 96 (!) 113  Resp: 16 (!) 23    Temp:   98.7 F (37.1 C) (!) 97.5 F (36.4 C)  TempSrc:   Oral Oral  SpO2: 92% 95% 100% 95%  Weight:      Height:        Intake/Output Summary (Last 24 hours) at 09/22/2017 1305 Last data filed at 09/22/2017 3875 Gross per 24 hour  Intake 1697 ml  Output 700 ml  Net 997 ml   Filed Weights   09/21/17 1515  Weight: 65.8 kg    Examination:  General exam: Appears calm and comfortable ,Not in distress,average built HEENT:PERRL,Oral mucosa moist, Ear/Nose normal on gross exam Respiratory system: Bilateral equal air entry, normal vesicular breath sounds, no wheezes or crackles  Cardiovascular system: S1 & S2 heard, RRR. No JVD, murmurs, rubs, gallops or clicks. No pedal edema. Gastrointestinal system: Abdomen is nondistended, soft and nontender. No organomegaly or masses felt. Normal bowel sounds heard. Central nervous system: Alert  and oriented. No focal neurological deficits. Extremities: No edema, no clubbing ,no cyanosis, distal peripheral pulses palpable.  Bruises and tenderness on the right arm.  Tenderness on the right hip.  Right arm sling Skin: No rashes, lesions or ulcers,no icterus ,no pallor MSK: Normal muscle bulk,tone ,power Psychiatry:  Judgement and insight appear normal. Mood & affect appropriate.     Data Reviewed: I have personally reviewed following labs and imaging studies  CBC: Recent Labs  Lab 09/21/17 1522 09/22/17 0709  WBC 14.7* 11.9*  NEUTROABS 13.3*  --   HGB 12.2 9.9*  HCT 37.0 29.8*  MCV 87.9 90.3  PLT 190 967*   Basic Metabolic Panel: Recent Labs  Lab 09/21/17 1522 09/22/17 0709  NA 132* 139  K 3.9 3.1*  CL 98 111  CO2 23 22  GLUCOSE 184* 89  BUN 15 13  CREATININE 0.94 0.62  CALCIUM 8.8* 6.9*   GFR: Estimated Creatinine Clearance: 44.4 mL/min (by C-G formula based on SCr of 0.62 mg/dL). Liver Function Tests: Recent Labs  Lab 09/22/17 0709  ALBUMIN 2.4*   No results for input(s): LIPASE, AMYLASE in the last 168 hours. No results for input(s): AMMONIA in the last 168 hours. Coagulation Profile: No results for input(s): INR, PROTIME in the last 168 hours. Cardiac Enzymes: Recent Labs  Lab 09/21/17 1522 09/21/17 2000 09/22/17 0236 09/22/17 0709  CKTOTAL 1,197*  --   --  1,160*  TROPONINI  --  <0.03 0.03* 0.03*   BNP (last 3 results) No results for input(s): PROBNP in the last 8760 hours. HbA1C: No results for input(s): HGBA1C in the last 72 hours. CBG: No results for input(s): GLUCAP in the last 168 hours. Lipid Profile: No results for input(s): CHOL, HDL, LDLCALC, TRIG, CHOLHDL, LDLDIRECT in the last 72 hours. Thyroid Function Tests: Recent Labs    09/22/17 0236 09/22/17 0709  TSH 0.481  --   FREET4  --  1.24   Anemia Panel: No results for input(s): VITAMINB12, FOLATE, FERRITIN, TIBC, IRON, RETICCTPCT in the last 72 hours. Sepsis Labs: No results for input(s): PROCALCITON, LATICACIDVEN in the last 168 hours.  Recent Results (from the past 240 hour(s))  Surgical PCR screen     Status: None   Collection Time: 09/22/17 12:57 AM  Result Value Ref Range Status   MRSA, PCR NEGATIVE NEGATIVE Final   Staphylococcus aureus NEGATIVE NEGATIVE Final    Comment:  (NOTE) The Xpert SA Assay (FDA approved for NASAL specimens in patients 60 years of age and older), is one component of a comprehensive surveillance program. It is not intended to diagnose infection nor to guide or monitor treatment. Performed at [facility], [place] [address] [place],  [address]          Radiology Studies: Dg Chest 1 View  Result Date: 09/21/2017 CLINICAL DATA:  Fall. EXAM: CHEST  1 VIEW COMPARISON:  Chest x-ray dated July 19, 2017. FINDINGS: The heart is at the upper limits of normal in size. Normal pulmonary vascularity. Atherosclerotic calcification of the aortic arch. No focal consolidation, pleural effusion, or pneumothorax. Chronic lower thoracic vertebral body compression deformities are unchanged. Partially visualized right humeral neck fracture. IMPRESSION: 1.  No active cardiopulmonary disease. 2. Right humeral neck fracture. Electronically Signed   By: Titus Dubin M.D.   On: 09/21/2017 17:27   Dg Lumbar Spine Complete  Result Date: 09/21/2017 CLINICAL DATA:  Back pain after fall. EXAM: LUMBAR SPINE - COMPLETE 4+ VIEW COMPARISON:  Lumbar spine x-rays  dated January 23, 2014. FINDINGS: New mild height loss along the right aspect of the L5 superior endplate. Chronic mild compression deformities of T10 through T12 are unchanged dating back to chest x-ray from June 2019. Chronic severe L1, mild L2, and moderate L3 compression deformities are unchanged dating back to 2016. Alignment is normal. Mild disc height loss at L4-L5, unchanged. The sacroiliac joints are unremarkable. Aortoiliac atherosclerotic vascular disease. IMPRESSION: 1. New mild height loss along the right aspect of the L5 superior endplate consistent with age-indeterminate fracture. Correlate with point tenderness. 2. Chronic T10 through L3 compression deformities are not significantly changed. Electronically Signed   By: Titus Dubin M.D.   On: 09/21/2017 17:34   Dg Shoulder  Right  Result Date: 09/21/2017 CLINICAL DATA:  Fall. EXAM: RIGHT HUMERUS - 2+ VIEW; RIGHT SHOULDER - 2+ VIEW COMPARISON:  None. FINDINGS: Impacted fracture of the proximal humerus surgical neck. No dislocation. Mild osteoarthritis of the acromioclavicular joint. Osteopenia. Soft tissues are unremarkable. IMPRESSION: Impacted proximal humeral neck fracture. Electronically Signed   By: Titus Dubin M.D.   On: 09/21/2017 17:38   Dg Elbow Complete Right  Result Date: 09/21/2017 CLINICAL DATA:  Fall. EXAM: RIGHT ELBOW - COMPLETE 3+ VIEW COMPARISON:  None. FINDINGS: There is no evidence of fracture, dislocation, or joint effusion. There is no evidence of arthropathy or other focal bone abnormality. Soft tissues are unremarkable. IMPRESSION: Negative. Electronically Signed   By: Titus Dubin M.D.   On: 09/21/2017 17:36   Dg Wrist Complete Right  Result Date: 09/21/2017 CLINICAL DATA:  Fall. EXAM: RIGHT WRIST - COMPLETE 3+ VIEW COMPARISON:  None. FINDINGS: No acute fracture or dislocation. Moderate scaphotrapeziotrapezoid and first [place] joint osteoarthritis. Severe osteopenia. Chondrocalcinosis of the TFCC. Soft tissues are unremarkable. IMPRESSION: 1.  No acute osseous abnormality. Electronically Signed   By: Titus Dubin M.D.   On: 09/21/2017 17:35   Ct Head Wo Contrast  Result Date: 09/21/2017 CLINICAL DATA:  Pt was found on the floor this morning says she fell out of bed about 10am and could not get to the phone EXAM: CT HEAD WITHOUT CONTRAST CT CERVICAL SPINE WITHOUT CONTRAST TECHNIQUE: Multidetector CT imaging of the head and cervical spine was performed following the standard protocol without intravenous contrast. Multiplanar CT image reconstructions of the cervical spine were also generated. COMPARISON:  Head CT 08/25/2014 FINDINGS: CT HEAD FINDINGS Brain: No acute intracranial hemorrhage. No focal mass lesion. No CT evidence of acute infarction. No midline shift or mass effect. No  hydrocephalus. Basilar cisterns are patent. There are periventricular and subcortical white matter hypodensities. Generalized cortical atrophy. Vascular: No hyperdense vessel or unexpected calcification. Skull: Normal. Negative for fracture or focal lesion. Sinuses/Orbits: Paranasal sinuses and mastoid air cells are clear. Orbits are clear. Other: None. CT spine findings : Alignment: Normal alignment of vertebral bodies. Skull base and vertebrae: Normal craniocervical junction. No loss of vertebral body height or disc height. Normal facet articulation. No evidence of fracture. Soft tissues and spinal canal: No prevertebral soft tissue swelling. No perispinal or epidural hematoma. Disc levels:  Mild endplate spurring and joint space narrowing. Upper chest: Clear Other: 3.7 cm nodule of the LEFT lobe of thyroid gland. IMPRESSION: 1. No intracranial trauma. 2. Atrophy and white matter microvascular disease. 3. No cervical spine fracture. 4. Large nodule of the LEFT lobe of thyroid gland. Recommend follow-up thyroid ultrasound for further characterization. Electronically Signed   By: Suzy Bouchard M.D.   On: 09/21/2017 18:15   Ct Cervical  Spine Wo Contrast  Result Date: 09/21/2017 CLINICAL DATA:  Pt was found on the floor this morning says she fell out of bed about 10am and could not get to the phone EXAM: CT HEAD WITHOUT CONTRAST CT CERVICAL SPINE WITHOUT CONTRAST TECHNIQUE: Multidetector CT imaging of the head and cervical spine was performed following the standard protocol without intravenous contrast. Multiplanar CT image reconstructions of the cervical spine were also generated. COMPARISON:  Head CT 08/25/2014 FINDINGS: CT HEAD FINDINGS Brain: No acute intracranial hemorrhage. No focal mass lesion. No CT evidence of acute infarction. No midline shift or mass effect. No hydrocephalus. Basilar cisterns are patent. There are periventricular and subcortical white matter hypodensities. Generalized cortical  atrophy. Vascular: No hyperdense vessel or unexpected calcification. Skull: Normal. Negative for fracture or focal lesion. Sinuses/Orbits: Paranasal sinuses and mastoid air cells are clear. Orbits are clear. Other: None. CT spine findings : Alignment: Normal alignment of vertebral bodies. Skull base and vertebrae: Normal craniocervical junction. No loss of vertebral body height or disc height. Normal facet articulation. No evidence of fracture. Soft tissues and spinal canal: No prevertebral soft tissue swelling. No perispinal or epidural hematoma. Disc levels:  Mild endplate spurring and joint space narrowing. Upper chest: Clear Other: 3.7 cm nodule of the LEFT lobe of thyroid gland. IMPRESSION: 1. No intracranial trauma. 2. Atrophy and white matter microvascular disease. 3. No cervical spine fracture. 4. Large nodule of the LEFT lobe of thyroid gland. Recommend follow-up thyroid ultrasound for further characterization. Electronically Signed   By: Suzy Bouchard M.D.   On: 09/21/2017 18:15   Ct Shoulder Right Wo Contrast  Result Date: 09/22/2017 CLINICAL DATA:  The patient suffered a fall out of bed 09/21/2017 with a right upper arm injury. Fracture on plain films. Initial encounter. EXAM: CT OF THE UPPER RIGHT EXTREMITY WITHOUT CONTRAST TECHNIQUE: Multidetector CT imaging of the upper right extremity was performed according to the standard protocol. COMPARISON:  Plain films right shoulder 09/21/2017. Single-view of the chest 07/19/2017. FINDINGS: Bones/Joint/Cartilage The patient has an impacted surgical neck fracture of the right humerus. There is 1 shaft width anterior displacement. The fracture does not involve the greater or lesser tuberosities. Fracture margins are partially corticated and there appears to be some callus formation present about the fracture. No bridging bone is identified. The acromioclavicular joint is intact and the humeral head is located. There is mild acromioclavicular  osteoarthritis. Remote T4 and T5 compression fractures incidentally noted. Ligaments Suboptimally assessed by CT. Muscles and Tendons Musculature of the shoulder girdle appears preserved. Soft tissues There is stranding in subcutaneous fat over the shoulder. No focal fluid collection or mass. Imaged lung parenchyma demonstrates mild dependent atelectasis. IMPRESSION: Impacted transverse surgical neck fracture of the humerus demonstrates 1 shaft width anterior displacement and does not involve the greater or lesser tuberosities. The fracture is new since the 07/19/2017 plain film of the chest but has imaging features compatible with subacute injury. Electronically Signed   By: Inge Rise M.D.   On: 09/22/2017 08:04   Dg Humerus Right  Result Date: 09/21/2017 CLINICAL DATA:  Fall. EXAM: RIGHT HUMERUS - 2+ VIEW; RIGHT SHOULDER - 2+ VIEW COMPARISON:  None. FINDINGS: Impacted fracture of the proximal humerus surgical neck. No dislocation. Mild osteoarthritis of the acromioclavicular joint. Osteopenia. Soft tissues are unremarkable. IMPRESSION: Impacted proximal humeral neck fracture. Electronically Signed   By: Titus Dubin M.D.   On: 09/21/2017 17:38   Dg Hip Unilat W Or Wo Pelvis 2-3 Views Right  Result  Date: 09/21/2017 CLINICAL DATA:  Right hip pain after fall. EXAM: DG HIP (WITH OR WITHOUT PELVIS) 2-3V RIGHT COMPARISON:  Right hip x-rays dated January 23, 2014. FINDINGS: Acute mildly displaced fracture of the right femoral neck. No dislocation. Mild right hip osteoarthritis. Osteopenia. IMPRESSION: Acute right femoral neck fracture. Electronically Signed   By: Titus Dubin M.D.   On: 09/21/2017 17:25        Scheduled Meds: . latanoprost  1 drop Both Eyes QHS  . levothyroxine  88 mcg Oral QAC breakfast  . loratadine  10 mg Oral Daily  . senna  1 tablet Oral BID  . timolol  1 drop Both Eyes BID   Continuous Infusions: . ciprofloxacin Stopped (09/22/17 0145)  . methocarbamol (ROBAXIN)  IV       LOS: 1 day    Time spent:25 mins. More than 50% of that time was spent in counseling and/or coordination of care.      Shelly Coss, MD [facility] Pager [phone]  If 7PM-7AM, please contact night-coverage [email or web address] Password [email or web address] 09/22/2017, 1:05 PM

## 2017-09-22 NOTE — Transfer of Care (Signed)
Immediate Anesthesia Transfer of Care Note  Patient: Stephanie Donovan  Procedure(s) Performed: ARTHROPLASTY BIPOLAR HIP (HEMIARTHROPLASTY) (Right Hip)  Patient Location: PACU  Anesthesia Type:General  Level of Consciousness: awake, alert  and oriented  Airway & Oxygen Therapy: Patient Spontanous Breathing and Patient connected to nasal cannula oxygen  Post-op Assessment: Report given to RN and Post -op Vital signs reviewed and stable  Post vital signs: Reviewed and stable  Last Vitals:  Vitals Value Taken Time  BP    Temp    Pulse    Resp    SpO2      Last Pain:  Vitals:   09/22/17 0800  TempSrc:   PainSc: 1       Patients Stated Pain Goal: 2 (79/39/03 0092)  Complications: No apparent anesthesia complications

## 2017-09-22 NOTE — Consult Note (Signed)
ORTHOPAEDIC CONSULTATION  REQUESTING PHYSICIAN: Shelly Coss, MD  Chief Complaint: Right shoulder and hip pain  HPI: Stephanie Donovan is a 84 y.o. female with fall from bed resulting in right shoulder and hip pain.  Patient had inability to get off the bed and was delayed in her presentation to the hospital as she was stuck in bed for multiple hours.  She had no other areas of injury.  Past Medical History:  Diagnosis Date  . Blood transfusion without reported diagnosis   . Chronic back pain   . GI bleed   . Glaucoma   . Thyroid disease    Past Surgical History:  Procedure Laterality Date  . COLONOSCOPY    . JOINT REPLACEMENT  11/20/2011   LTKR   Social History   Socioeconomic History  . Marital status: Widowed    Spouse name: Not on file  . Number of children: Not on file  . Years of education: Not on file  . Highest education level: Not on file  Occupational History  . Not on file  Social Needs  . Financial resource strain: Not on file  . Food insecurity:    Worry: Not on file    Inability: Not on file  . Transportation needs:    Medical: Not on file    Non-medical: Not on file  Tobacco Use  . Smoking status: Never Smoker  . Smokeless tobacco: Never Used  Substance and Sexual Activity  . Alcohol use: No  . Drug use: No  . Sexual activity: Never    Birth control/protection: Post-menopausal  Lifestyle  . Physical activity:    Days per week: Not on file    Minutes per session: Not on file  . Stress: Not on file  Relationships  . Social connections:    Talks on phone: Not on file    Gets together: Not on file    Attends religious service: Not on file    Active member of club or organization: Not on file    Attends meetings of clubs or organizations: Not on file    Relationship status: Not on file  Other Topics Concern  . Not on file  Social History Narrative  . Not on file   Family History  Problem Relation Age of Onset  . Osteoporosis  Mother   . Cancer - Colon Father   . Schizophrenia Son    Allergies  Allergen Reactions  . Aspirin Palpitations    Mixed reactions Mixed reactions  . Epinephrine Other (See Comments)    Rapped heart beat  . Penicillins     Lost hearing temporarily  . Codeine Palpitations   Prior to Admission medications   Medication Sig Start Date End Date Taking? Authorizing Provider  calcium-vitamin D (OSCAL WITH D) 500-200 MG-UNIT per tablet Take 1 tablet by mouth every morning.   Yes [provider]  cetirizine (ZYRTEC) 10 MG tablet Take 10 mg by mouth daily. 08/17/17  Yes [provider]  diclofenac sodium (VOLTAREN) 1 % GEL Place 2 g onto the skin 4 (four) times daily as needed (for knee pain).  07/12/17  Yes [provider]  latanoprost (XALATAN) 0.005 % ophthalmic solution Place 1 drop into both eyes at bedtime.   Yes [provider]  levothyroxine (SYNTHROID, LEVOTHROID) 88 MCG tablet Take 88 mcg by mouth daily. 08/27/14  Yes [provider]  sulfamethoxazole-trimethoprim (BACTRIM DS,SEPTRA DS) 800-160 MG tablet Take 1 tablet by mouth 2 (two) times  daily.  09/19/17 09/22/17 Yes [provider]  timolol (TIMOPTIC) 0.5 % ophthalmic solution Place 1 drop into both eyes 2 (two) times daily.   Yes [provider]  atorvastatin (LIPITOR) 20 MG tablet Take 1 tablet (20 mg total) by mouth daily. Patient not taking: Reported on 09/21/2014 08/26/14   Delfina Redwood, MD  clopidogrel (PLAVIX) 75 MG tablet Take 1 tablet (75 mg total) by mouth daily. Patient not taking: Reported on 09/21/2014 08/26/14   Delfina Redwood, MD  HYDROcodone-acetaminophen [facility]) 5-325 MG per tablet Take 1 tablet by mouth every 4 (four) hours as needed. Patient not taking: Reported on 09/21/2017 09/21/14   Elnora Morrison, MD   Dg Chest 1 View  Result Date: 09/21/2017 CLINICAL DATA:  Fall. EXAM: CHEST  1 VIEW COMPARISON:  Chest x-ray dated July 19, 2017. FINDINGS: The  heart is at the upper limits of normal in size. Normal pulmonary vascularity. Atherosclerotic calcification of the aortic arch. No focal consolidation, pleural effusion, or pneumothorax. Chronic lower thoracic vertebral body compression deformities are unchanged. Partially visualized right humeral neck fracture. IMPRESSION: 1.  No active cardiopulmonary disease. 2. Right humeral neck fracture. Electronically Signed   By: Titus Dubin M.D.   On: 09/21/2017 17:27   Dg Lumbar Spine Complete  Result Date: 09/21/2017 CLINICAL DATA:  Back pain after fall. EXAM: LUMBAR SPINE - COMPLETE 4+ VIEW COMPARISON:  Lumbar spine x-rays dated January 23, 2014. FINDINGS: New mild height loss along the right aspect of the L5 superior endplate. Chronic mild compression deformities of T10 through T12 are unchanged dating back to chest x-ray from June 2019. Chronic severe L1, mild L2, and moderate L3 compression deformities are unchanged dating back to 2016. Alignment is normal. Mild disc height loss at L4-L5, unchanged. The sacroiliac joints are unremarkable. Aortoiliac atherosclerotic vascular disease. IMPRESSION: 1. New mild height loss along the right aspect of the L5 superior endplate consistent with age-indeterminate fracture. Correlate with point tenderness. 2. Chronic T10 through L3 compression deformities are not significantly changed. Electronically Signed   By: Titus Dubin M.D.   On: 09/21/2017 17:34   Dg Shoulder Right  Result Date: 09/21/2017 CLINICAL DATA:  Fall. EXAM: RIGHT HUMERUS - 2+ VIEW; RIGHT SHOULDER - 2+ VIEW COMPARISON:  None. FINDINGS: Impacted fracture of the proximal humerus surgical neck. No dislocation. Mild osteoarthritis of the acromioclavicular joint. Osteopenia. Soft tissues are unremarkable. IMPRESSION: Impacted proximal humeral neck fracture. Electronically Signed   By: Titus Dubin M.D.   On: 09/21/2017 17:38   Dg Elbow Complete Right  Result Date: 09/21/2017 CLINICAL DATA:  Fall.  EXAM: RIGHT ELBOW - COMPLETE 3+ VIEW COMPARISON:  None. FINDINGS: There is no evidence of fracture, dislocation, or joint effusion. There is no evidence of arthropathy or other focal bone abnormality. Soft tissues are unremarkable. IMPRESSION: Negative. Electronically Signed   By: Titus Dubin M.D.   On: 09/21/2017 17:36   Dg Wrist Complete Right  Result Date: 09/21/2017 CLINICAL DATA:  Fall. EXAM: RIGHT WRIST - COMPLETE 3+ VIEW COMPARISON:  None. FINDINGS: No acute fracture or dislocation. Moderate scaphotrapeziotrapezoid and first [place] joint osteoarthritis. Severe osteopenia. Chondrocalcinosis of the TFCC. Soft tissues are unremarkable. IMPRESSION: 1.  No acute osseous abnormality. Electronically Signed   By: Titus Dubin M.D.   On: 09/21/2017 17:35   Ct Head Wo Contrast  Result Date: 09/21/2017 CLINICAL DATA:  Pt was found on the floor this morning says she fell out of bed about 10am and could not get to  the phone EXAM: CT HEAD WITHOUT CONTRAST CT CERVICAL SPINE WITHOUT CONTRAST TECHNIQUE: Multidetector CT imaging of the head and cervical spine was performed following the standard protocol without intravenous contrast. Multiplanar CT image reconstructions of the cervical spine were also generated. COMPARISON:  Head CT 08/25/2014 FINDINGS: CT HEAD FINDINGS Brain: No acute intracranial hemorrhage. No focal mass lesion. No CT evidence of acute infarction. No midline shift or mass effect. No hydrocephalus. Basilar cisterns are patent. There are periventricular and subcortical white matter hypodensities. Generalized cortical atrophy. Vascular: No hyperdense vessel or unexpected calcification. Skull: Normal. Negative for fracture or focal lesion. Sinuses/Orbits: Paranasal sinuses and mastoid air cells are clear. Orbits are clear. Other: None. CT spine findings : Alignment: Normal alignment of vertebral bodies. Skull base and vertebrae: Normal craniocervical junction. No loss of vertebral body height or  disc height. Normal facet articulation. No evidence of fracture. Soft tissues and spinal canal: No prevertebral soft tissue swelling. No perispinal or epidural hematoma. Disc levels:  Mild endplate spurring and joint space narrowing. Upper chest: Clear Other: 3.7 cm nodule of the LEFT lobe of thyroid gland. IMPRESSION: 1. No intracranial trauma. 2. Atrophy and white matter microvascular disease. 3. No cervical spine fracture. 4. Large nodule of the LEFT lobe of thyroid gland. Recommend follow-up thyroid ultrasound for further characterization. Electronically Signed   By: Suzy Bouchard M.D.   On: 09/21/2017 18:15   Ct Cervical Spine Wo Contrast  Result Date: 09/21/2017 CLINICAL DATA:  Pt was found on the floor this morning says she fell out of bed about 10am and could not get to the phone EXAM: CT HEAD WITHOUT CONTRAST CT CERVICAL SPINE WITHOUT CONTRAST TECHNIQUE: Multidetector CT imaging of the head and cervical spine was performed following the standard protocol without intravenous contrast. Multiplanar CT image reconstructions of the cervical spine were also generated. COMPARISON:  Head CT 08/25/2014 FINDINGS: CT HEAD FINDINGS Brain: No acute intracranial hemorrhage. No focal mass lesion. No CT evidence of acute infarction. No midline shift or mass effect. No hydrocephalus. Basilar cisterns are patent. There are periventricular and subcortical white matter hypodensities. Generalized cortical atrophy. Vascular: No hyperdense vessel or unexpected calcification. Skull: Normal. Negative for fracture or focal lesion. Sinuses/Orbits: Paranasal sinuses and mastoid air cells are clear. Orbits are clear. Other: None. CT spine findings : Alignment: Normal alignment of vertebral bodies. Skull base and vertebrae: Normal craniocervical junction. No loss of vertebral body height or disc height. Normal facet articulation. No evidence of fracture. Soft tissues and spinal canal: No prevertebral soft tissue swelling. No  perispinal or epidural hematoma. Disc levels:  Mild endplate spurring and joint space narrowing. Upper chest: Clear Other: 3.7 cm nodule of the LEFT lobe of thyroid gland. IMPRESSION: 1. No intracranial trauma. 2. Atrophy and white matter microvascular disease. 3. No cervical spine fracture. 4. Large nodule of the LEFT lobe of thyroid gland. Recommend follow-up thyroid ultrasound for further characterization. Electronically Signed   By: Suzy Bouchard M.D.   On: 09/21/2017 18:15   Ct Shoulder Right Wo Contrast  Result Date: 09/22/2017 CLINICAL DATA:  The patient suffered a fall out of bed 09/21/2017 with a right upper arm injury. Fracture on plain films. Initial encounter. EXAM: CT OF THE UPPER RIGHT EXTREMITY WITHOUT CONTRAST TECHNIQUE: Multidetector CT imaging of the upper right extremity was performed according to the standard protocol. COMPARISON:  Plain films right shoulder 09/21/2017. Single-view of the chest 07/19/2017. FINDINGS: Bones/Joint/Cartilage The patient has an impacted surgical neck fracture of the right humerus. There  is 1 shaft width anterior displacement. The fracture does not involve the greater or lesser tuberosities. Fracture margins are partially corticated and there appears to be some callus formation present about the fracture. No bridging bone is identified. The acromioclavicular joint is intact and the humeral head is located. There is mild acromioclavicular osteoarthritis. Remote T4 and T5 compression fractures incidentally noted. Ligaments Suboptimally assessed by CT. Muscles and Tendons Musculature of the shoulder girdle appears preserved. Soft tissues There is stranding in subcutaneous fat over the shoulder. No focal fluid collection or mass. Imaged lung parenchyma demonstrates mild dependent atelectasis. IMPRESSION: Impacted transverse surgical neck fracture of the humerus demonstrates 1 shaft width anterior displacement and does not involve the greater or lesser tuberosities.  The fracture is new since the 07/19/2017 plain film of the chest but has imaging features compatible with subacute injury. Electronically Signed   By: Inge Rise M.D.   On: 09/22/2017 08:04   Dg Humerus Right  Result Date: 09/21/2017 CLINICAL DATA:  Fall. EXAM: RIGHT HUMERUS - 2+ VIEW; RIGHT SHOULDER - 2+ VIEW COMPARISON:  None. FINDINGS: Impacted fracture of the proximal humerus surgical neck. No dislocation. Mild osteoarthritis of the acromioclavicular joint. Osteopenia. Soft tissues are unremarkable. IMPRESSION: Impacted proximal humeral neck fracture. Electronically Signed   By: Titus Dubin M.D.   On: 09/21/2017 17:38   Dg Hip Unilat W Or Wo Pelvis 2-3 Views Right  Result Date: 09/21/2017 CLINICAL DATA:  Right hip pain after fall. EXAM: DG HIP (WITH OR WITHOUT PELVIS) 2-3V RIGHT COMPARISON:  Right hip x-rays dated January 23, 2014. FINDINGS: Acute mildly displaced fracture of the right femoral neck. No dislocation. Mild right hip osteoarthritis. Osteopenia. IMPRESSION: Acute right femoral neck fracture. Electronically Signed   By: Titus Dubin M.D.   On: 09/21/2017 17:25   Family History Reviewed and non-contributory, no pertinent history of problems with bleeding or anesthesia      Review of Systems 14 system ROS conducted and negative except for that noted in HPI   OBJECTIVE  Vitals: Patient Vitals for the past 8 hrs:  BP Temp Temp src Pulse SpO2  09/22/17 0441 111/71 (!) 97.5 F (36.4 C) Oral (!) 113 95 %   General: Alert, no acute distress Cardiovascular: No pedal edema Respiratory: No cyanosis, no use of accessory musculature GI: No organomegaly, abdomen is soft and non-tender Skin: No lesions in the area of chief complaint other than those listed below in MSK exam.  Neurologic: Sensation intact distally save for the below mentioned MSK exam Psychiatric: Patient is competent for consent with normal mood and affect Lymphatic: No axillary or cervical  lymphadenopathy Extremities  RUE: Obvious ecchymosis and swelling around the shoulder, distal motor and sensory function preserved, unable to test axillary nerve function secondary patient pain, elbow and wrist range of motion appear to be intact RLE: Shortened and externally rotated.  ROM deferred. + GS/TA/EHL. Sensation intact in DP/SP/S/S/P distributions. 2+ DP pulse with warm and well perfused digits. Compartments soft and compressible, with no pain on passive stretch.     Test Results Imaging X-rays and CT of the shoulder reviewed as well as x-rays of the hip.  X-rays and CT of the shoulder demonstrate a impacted surgical neck fracture with acceptable alignment of the shaft relative to the head.  X-rays of the right hip demonstrated angulated femoral neck fracture with displacement. Labs cbc Recent Labs    09/21/17 1522 09/22/17 0709  WBC 14.7* 11.9*  HGB 12.2 9.9*  HCT 37.0 29.8*  PLT 190 141*    Labs inflam No results for input(s): CRP in the last 72 hours.  Invalid input(s): ESR  Labs coag No results for input(s): INR, PTT in the last 72 hours.  Invalid input(s): PT  Recent Labs    09/21/17 1522 09/22/17 0709  NA 132* 139  K 3.9 3.1*  CL 98 111  CO2 23 22  GLUCOSE 184* 89  BUN 15 13  CREATININE 0.94 0.62  CALCIUM 8.8* 6.9*     ASSESSMENT AND PLAN: 84 y.o. female with the following: Right proximal humerus and right femoral neck fractures  Talked with the plan of care in regards to the shoulder length.  This will likely be treated nonoperatively but should be nonweightbearing on the side for 6 weeks.  This will make mobilization difficult and the patient will likely need to SNF after her surgery on her hip.  Discussed the nature of the injury as well as the care with the patient as well as the family.  Nonoperative measures are not well tolerated as patient's on bedrest for extended periods of time tend to develop secondary issues such as pneumonia, urinary  tract infections, bedsores and delirium.  Based on this our recommendation is for operative measures.  The risks benefits and alternatives were discussed with the patient including but not limited to the risks of nonoperative treatment, versus surgical intervention including infection, bleeding, nerve injury, periprosthetic fracture, the need for revision surgery, dislocation, leg length discrepancy, gait change, blood clots, cardiopulmonary complications, morbidity, mortality, among others, and they were willing to proceed.     Plan for hip hemiarthroplasty today on the right side though if operating room continues to be delayed this may have to be delayed till tomorrow.

## 2017-09-22 NOTE — Anesthesia Procedure Notes (Signed)
Procedure Name: Intubation Date/Time: 09/22/2017 4:29 PM Performed by: Clearnce Sorrel, CRNA Pre-anesthesia Checklist: Patient identified, Emergency Drugs available, Suction available, Patient being monitored and Timeout performed Patient Re-evaluated:Patient Re-evaluated prior to induction Oxygen Delivery Method: Circle system utilized Preoxygenation: Pre-oxygenation with 100% oxygen Induction Type: IV induction Ventilation: Mask ventilation without difficulty Laryngoscope Size: Mac and 3 Grade View: Grade I Tube type: Oral Tube size: 7.0 mm Number of attempts: 1 Airway Equipment and Method: Stylet Placement Confirmation: ETT inserted through vocal cords under direct vision,  positive ETCO2 and breath sounds checked- equal and bilateral Secured at: 22 cm Tube secured with: Tape Dental Injury: Teeth and Oropharynx as per pre-operative assessment

## 2017-09-23 LAB — CBC WITH DIFFERENTIAL/PLATELET
Abs Immature Granulocytes: 0.1 10*3/uL (ref 0.0–0.1)
BASOS ABS: 0 10*3/uL (ref 0.0–0.1)
Basophils Relative: 0 %
EOS PCT: 0 %
Eosinophils Absolute: 0 10*3/uL (ref 0.0–0.7)
HCT: 26.5 % — ABNORMAL LOW (ref 36.0–46.0)
HEMOGLOBIN: 8.5 g/dL — AB (ref 12.0–15.0)
IMMATURE GRANULOCYTES: 1 %
LYMPHS ABS: 0.5 10*3/uL — AB (ref 0.7–4.0)
LYMPHS PCT: 4 %
MCH: 28.5 pg (ref 26.0–34.0)
MCHC: 32.1 g/dL (ref 30.0–36.0)
MCV: 88.9 fL (ref 78.0–100.0)
Monocytes Absolute: 0.5 10*3/uL (ref 0.1–1.0)
Monocytes Relative: 5 %
NEUTROS PCT: 90 %
Neutro Abs: 10.4 10*3/uL — ABNORMAL HIGH (ref 1.7–7.7)
Platelets: 134 10*3/uL — ABNORMAL LOW (ref 150–400)
RBC: 2.98 MIL/uL — AB (ref 3.87–5.11)
RDW: 13.8 % (ref 11.5–15.5)
WBC: 11.5 10*3/uL — AB (ref 4.0–10.5)

## 2017-09-23 LAB — BASIC METABOLIC PANEL
ANION GAP: 4 — AB (ref 5–15)
BUN: 16 mg/dL (ref 8–23)
CALCIUM: 8 mg/dL — AB (ref 8.9–10.3)
CHLORIDE: 105 mmol/L (ref 98–111)
CO2: 25 mmol/L (ref 22–32)
Creatinine, Ser: 0.77 mg/dL (ref 0.44–1.00)
GFR calc non Af Amer: >60 mL/min (ref >60)
GLUCOSE: 120 mg/dL — AB (ref 70–99)
Potassium: 4.5 mmol/L (ref 3.5–5.1)
SODIUM: 134 mmol/L — AB (ref 135–145)

## 2017-09-23 LAB — T3: T3 TOTAL: 56 ng/dL — AB (ref 71–180)

## 2017-09-23 LAB — CK: Total CK: 545 U/L — ABNORMAL HIGH (ref 38–234)

## 2017-09-23 MED ORDER — CIPROFLOXACIN IN D5W 400 MG/200ML IV SOLN
400.0000 mg | Freq: Two times a day (BID) | INTRAVENOUS | Status: DC
  Administered 2017-09-23 – 2017-09-24 (×3): 400 mg via INTRAVENOUS
  Filled 2017-09-23 (×4): qty 200

## 2017-09-23 NOTE — Clinical Social Work Note (Signed)
Clinical Social Work Assessment  Patient Details  Name: Stephanie Donovan MRN: [id number] Date of Birth: [birth year]  Date of referral:  09/23/17               Reason for consult:  Facility Placement                Permission sought to share information with:  Facility Sport and exercise psychologist, Family Supports Permission granted to share information::  Yes, Verbal Permission Granted  Name::        Agency::  SNFs  Relationship::     Contact Information:     Housing/Transportation Living arrangements for the past 2 months:  Single Family Home Source of Information:  Patient Patient Interpreter Needed:  None Criminal Activity/Legal Involvement Pertinent to Current Situation/Hospitalization:  No - Comment as needed Significant Relationships:  Adult Children Lives with:  Self Do you feel safe going back to the place where you live?  No Need for family participation in patient care:  No (Coment)  Care giving concerns:  CSW received consult for possible SNF placement at time of discharge. CSW spoke with patient regarding PT recommendation of SNF placement at time of discharge. Patient reported that patient's family is currently unable to care for patient at their home given patient's current physical needs and fall risk. Patient expressed understanding of PT recommendation and is agreeable to SNF placement at time of discharge. CSW to continue to follow and assist with discharge planning needs.   Social Worker assessment / plan:  CSW spoke with patient concerning possibility of rehab at [facility] before returning home.  Employment status:  Retired Forensic scientist:  Medicare PT Recommendations:  [place] / Referral to community resources:  [place]  Patient/Family's Response to care:  Patient is very pleasant and recognizes need for rehab before returning home and is agreeable to a SNF in [place]. Patient reported preference for [facility]. CSW will check availability.   Patient/Family's Understanding of and Emotional Response to Diagnosis, Current Treatment, and Prognosis:  Patient/family is realistic regarding therapy needs and expressed being hopeful for SNF placement. Patient expressed understanding of CSW role and discharge process as well as medical condition. No questions/concerns about plan or treatment.    Emotional Assessment Appearance:  Appears stated age Attitude/Demeanor/Rapport:  Gracious, Engaged Affect (typically observed):  Accepting, Appropriate, Pleasant Orientation:  Oriented to Self, Oriented to Place, Oriented to  Time, Oriented to Situation Alcohol / Substance use:  Not Applicable Psych involvement (Current and /or in the community):  No (Comment)  Discharge Needs  Concerns to be addressed:  Care Coordination Readmission within the last 30 days:  No Current discharge risk:  None Barriers to Discharge:  Continued Medical Work up   [employer], [place] 09/23/2017, 1:43 PM

## 2017-09-23 NOTE — [place] FL2 (Signed)
[place] MEDICAID FL2 LEVEL OF CARE SCREENING TOOL     IDENTIFICATION  Patient Name: Stephanie Donovan Birthdate: March 28, 1927 Sex: female Admission Date (Current Location): 09/21/2017  [facility] and [place] Number:  Herbalist and Address:  [facility] [place], [place] [address] [place], [place] [address]      Provider Number: [id number]  Attending Physician Name and Address:  Shelly Coss, MD  Relative Name and Phone Number:  [place], daughter, [phone]    Current Level of Care: Hospital Recommended Level of Care: [place] Prior Approval Number:    Date Approved/Denied:   PASRR Number: [id number]  Discharge Plan: SNF    Current Diagnoses: Patient Active Problem List   Diagnosis Date Noted  . Paroxysmal A-fib ([place]) 09/21/2017  . Closed displaced fracture of right femoral neck ([place]) 09/21/2017  . Closed fracture of right proximal humerus 09/21/2017  . Dehydration 09/21/2017  . Hyponatremia 09/21/2017  . Elevated CK 09/21/2017  . Thyroid nodule 09/21/2017  . Closed right hip fracture ([place]) 09/21/2017  . Chronic diastolic CHF (congestive heart failure) ([place]) 09/21/2017  . Sinus tachycardia 09/21/2017  . Acute lower UTI 09/21/2017  . TIA (transient ischemic attack) 08/25/2014  . Aphasia 08/25/2014  . Chronic constipation 05/19/2014  . Fracture of multiple pubic rami ([place]) 04/11/2014  . Pubic bone fracture ([place]) 04/11/2014  . Left hip pain 04/11/2014  . Atrial fibrillation with RVR ([place]) 03/28/2013  . Diarrhea 03/28/2013  . Hypothyroidism 03/24/2013  . Syncope 03/24/2013  . Glaucoma 03/24/2013  . Hypokalemia 03/24/2013  . Acute gastroenteritis 03/24/2013    Orientation RESPIRATION BLADDER Height & Weight     Self, Time, Situation, Place  Normal Continent Weight: 65.8 kg Height:  5\' 3"  (160 cm)  BEHAVIORAL SYMPTOMS/MOOD NEUROLOGICAL BOWEL NUTRITION STATUS      Continent Diet(Please see DC Summary)  AMBULATORY  STATUS COMMUNICATION OF NEEDS Skin   Extensive Assist Verbally Surgical wounds(Closed incision on leg)                       Personal Care Assistance Level of Assistance  Bathing, Feeding, Dressing Bathing Assistance: Maximum assistance Feeding assistance: Independent Dressing Assistance: Limited assistance     Functional Limitations Info  Sight, Hearing, Speech Sight Info: Adequate Hearing Info: Adequate Speech Info: Adequate    SPECIAL CARE FACTORS FREQUENCY  PT (By licensed PT), OT (By licensed OT)     PT Frequency: 5x/week OT Frequency: 3x/week            Contractures Contractures Info: Not present    Additional Factors Info  Code Status, Allergies Code Status Info: Full Allergies Info: Allergies:  Aspirin, Epinephrine, Penicillins, Codeine           Current Medications (09/23/2017):  This is the current hospital active medication list Current Facility-Administered Medications  Medication Dose Route Frequency Provider Last Rate Last Dose  . acetaminophen (TYLENOL) tablet 1,000 mg  1,000 mg Oral Q8H Ophelia Charter T, MD   1,000 mg at 09/23/17 1314  . ciprofloxacin (CIPRO) IVPB 400 mg  400 mg Intravenous Q24H Shelly Coss, MD 200 mL/hr at 09/23/17 0019 400 mg at 09/23/17 0019  . docusate sodium (COLACE) capsule 100 mg  100 mg Oral BID Ophelia Charter T, MD   100 mg at 09/23/17 0854  . enoxaparin (LOVENOX) injection 40 mg  40 mg Subcutaneous Q24H Ophelia Charter T, MD   40 mg at 09/23/17 0853  . lactated ringers infusion  Intravenous Continuous Oleta Mouse, MD 10 mL/hr at 09/22/17 1533    . latanoprost (XALATAN) 0.005 % ophthalmic solution 1 drop  1 drop Both Eyes QHS Doutova, Anastassia, MD   1 drop at 09/22/17 2112  . levothyroxine (SYNTHROID, LEVOTHROID) tablet 88 mcg  88 mcg Oral QAC breakfast Toy Baker, MD   88 mcg at 09/23/17 0853  . loratadine (CLARITIN) tablet 10 mg  10 mg Oral Daily Doutova, Anastassia, MD   10 mg at 09/23/17 0854  .  menthol-cetylpyridinium (CEPACOL) lozenge 3 mg  1 lozenge Oral PRN [name], MD       Or  . phenol (CHLORASEPTIC) mouth spray 1 spray  1 spray Mouth/Throat PRN [name], MD      . methocarbamol (ROBAXIN) tablet 500 mg  500 mg Oral Q6H PRN Toy Baker, MD   500 mg at 09/21/17 2252   Or  . methocarbamol (ROBAXIN) 500 mg in dextrose 5 % 50 mL IVPB  500 mg Intravenous Q6H PRN Doutova, Anastassia, MD      . metoCLOPramide (REGLAN) tablet 5-10 mg  5-10 mg Oral Q8H PRN [name], MD       Or  . metoCLOPramide (REGLAN) injection 5-10 mg  5-10 mg Intravenous Q8H PRN Ophelia Charter T, MD      . morphine 2 MG/ML injection 0.5 mg  0.5 mg Intravenous Q2H PRN Doutova, Anastassia, MD      . oxyCODONE (Oxy IR/ROXICODONE) immediate release tablet 5-10 mg  5-10 mg Oral Q4H PRN Ophelia Charter T, MD      . polyethylene glycol (MIRALAX / GLYCOLAX) packet 17 g  17 g Oral Daily PRN Doutova, Anastassia, MD      . senna (SENOKOT) tablet 8.6 mg  1 tablet Oral BID Toy Baker, MD   8.6 mg at 09/23/17 0854  . timolol (TIMOPTIC) 0.5 % ophthalmic solution 1 drop  1 drop Both Eyes BID Toy Baker, MD   1 drop at 09/23/17 0855     Discharge Medications: Please see discharge summary for a list of discharge medications.  Relevant Imaging Results:  Relevant Lab Results:   Additional Information SSN: [place], [place]

## 2017-09-23 NOTE — Progress Notes (Signed)
PROGRESS NOTE    Stephanie Donovan  OFB:[id number] DOB: 04-02-1927 DOA: 09/21/2017 PCP: [facility]   Brief Narrative: Patient is 84 year old female with past medical history of hypothyroidism, TIA, paroxysmal A. fib, diastolic CHF who presented from home to the emergency department after she fell from her bed.  Imagings done on presentation showed close displaced fracture of right femoral neck and closed fracture of right proximal humerus.  Orthopedics consulted. Underwent right hemiarthroplasty today.  Awaiting PT/OT evaluation.  Assessment & Plan:   Active Problems:   Hypothyroidism   Chronic constipation   Paroxysmal A-fib (HCC)   Closed displaced fracture of right femoral neck (HCC)   Closed fracture of right proximal humerus   Dehydration   Hyponatremia   Elevated CK   Thyroid nodule   Closed right hip fracture (HCC)   Chronic diastolic CHF (congestive heart failure) (HCC)   Sinus tachycardia   Acute lower UTI  Closed displaced fracture of right femoral neck/Impacted proximal humeral neck fracture.: Underwent right-sided hip hemiarthroplasty .  Plan for conservative management for the fracture of the humerus.  Currently she has a sling. PT/OT .  DVT prophylaxis with Lovenox.  Pain management.  Patient might need rehab after surgery.  Hyponatremia: Stable  Elevated CK: Improving.Most likely secondary to fall.  Will DC  IV fluids  Thyroid nodule: TSH normal. CT cervical spine showed large nodule of the LEFT lobe of thyroid gland. [place] follow-up as an outpatient.  Further investigation of this large thyroid nodule in this 84 year old female is questionable.  Hypothyroidism: Continue Synthyroid.  Paroxysmal A. fib: Not on anticoagulation most likely secondary to advanced age and risk for falls.  Currently rate is controlled.  Not on beta-blockers.  Chronic CHF: Currently compensated.  Appeared dry on presentation and was given IV  fluids.  History of glaucoma: Continue home meds.  History of recent UTI: On oral antibiotics at home.  Was on day 1.Switched to IV here.  UA done here was not impressive of UTI.  Hypokalemia: Supplemented and corrected.   DVT prophylaxis: Lovenox Code Status: Full Family Communication: None present at the bedside Disposition Plan: Awaiting PT/OT evaluation   Consultants: Ortho  Procedures: None  Antimicrobials: Cipro  Subjective: Patient seen and examined at bedside this morning.   Remains comfortable.  Pain is well controlled  Objective: Vitals:   09/22/17 1930 09/22/17 1944 09/23/17 0006 09/23/17 0459  BP:  118/68 (!) 92/55 95/66  Pulse: (!) 126 97 85 80  Resp: 12 14 16 16   Temp:    97.6 F (36.4 C)  TempSrc:    Oral  SpO2: 100% 98% 99% 99%  Weight:      Height:        Intake/Output Summary (Last 24 hours) at 09/23/2017 1232 Last data filed at 09/23/2017 0445 Gross per 24 hour  Intake 1318.11 ml  Output 1400 ml  Net -81.89 ml   Filed Weights   09/21/17 1515  Weight: 65.8 kg    Examination:  General exam: Appears calm and comfortable ,Not in distress,average built HEENT:PERRL,Oral mucosa moist, Ear/Nose normal on gross exam Respiratory system: Bilateral equal air entry, normal vesicular breath sounds, no wheezes or crackles  Cardiovascular system: S1 & S2 heard, RRR. No JVD, murmurs, rubs, gallops or clicks. No pedal edema. Gastrointestinal system: Abdomen is nondistended, soft and nontender. No organomegaly or masses felt. Normal bowel sounds heard. Central nervous system: Alert and oriented. No focal neurological deficits. Extremities: No edema, no clubbing ,  no cyanosis, distal peripheral pulses palpable.  Bruises and tenderness on the right arm.  Right arm sling.  Clean surgical wound on the right hip Skin: No rashes, lesions or ulcers,no icterus ,no pallor MSK: Normal muscle bulk,tone ,power Psychiatry: Judgement and insight appear normal. Mood & affect  appropriate.     Data Reviewed: I have personally reviewed following labs and imaging studies  CBC: Recent Labs  Lab 09/21/17 1522 09/22/17 0709 09/23/17 0346  WBC 14.7* 11.9* 11.5*  NEUTROABS 13.3*  --  10.4*  HGB 12.2 9.9* 8.5*  HCT 37.0 29.8* 26.5*  MCV 87.9 90.3 88.9  PLT 190 141* 710*   Basic Metabolic Panel: Recent Labs  Lab 09/21/17 1522 09/22/17 0709 09/23/17 0346  NA 132* 139 134*  K 3.9 3.1* 4.5  CL 98 111 105  CO2 23 22 25   GLUCOSE 184* 89 120*  BUN 15 13 16   CREATININE 0.94 0.62 0.77  CALCIUM 8.8* 6.9* 8.0*   GFR: Estimated Creatinine Clearance: 44.4 mL/min (by C-G formula based on SCr of 0.77 mg/dL). Liver Function Tests: Recent Labs  Lab 09/22/17 0709  ALBUMIN 2.4*   No results for input(s): LIPASE, AMYLASE in the last 168 hours. No results for input(s): AMMONIA in the last 168 hours. Coagulation Profile: No results for input(s): INR, PROTIME in the last 168 hours. Cardiac Enzymes: Recent Labs  Lab 09/21/17 1522 09/21/17 2000 09/22/17 0236 09/22/17 0709 09/23/17 0346  CKTOTAL 1,197*  --   --  1,160* 545*  TROPONINI  --  <0.03 0.03* 0.03*  --    BNP (last 3 results) No results for input(s): PROBNP in the last 8760 hours. HbA1C: No results for input(s): HGBA1C in the last 72 hours. CBG: No results for input(s): GLUCAP in the last 168 hours. Lipid Profile: No results for input(s): CHOL, HDL, LDLCALC, TRIG, CHOLHDL, LDLDIRECT in the last 72 hours. Thyroid Function Tests: Recent Labs    09/22/17 0236 09/22/17 0709  TSH 0.481  --   FREET4  --  1.24   Anemia Panel: No results for input(s): VITAMINB12, FOLATE, FERRITIN, TIBC, IRON, RETICCTPCT in the last 72 hours. Sepsis Labs: No results for input(s): PROCALCITON, LATICACIDVEN in the last 168 hours.  Recent Results (from the past 240 hour(s))  Surgical PCR screen     Status: None   Collection Time: 09/22/17 12:57 AM  Result Value Ref Range Status   MRSA, PCR NEGATIVE NEGATIVE  Final   Staphylococcus aureus NEGATIVE NEGATIVE Final    Comment: (NOTE) The Xpert SA Assay (FDA approved for NASAL specimens in patients 66 years of age and older), is one component of a comprehensive surveillance program. It is not intended to diagnose infection nor to guide or monitor treatment. Performed at [facility], [place] [address] [place], [place] [address]   Urine Culture     Status: Abnormal (Preliminary result)   Collection Time: 09/22/17  1:19 AM  Result Value Ref Range Status   Specimen Description URINE, CLEAN CATCH  Final   Special Requests   Final    NONE Performed at [facility], [place] [address], [place], [place] [address]    Culture 20,000 COLONIES/mL ESCHERICHIA COLI (A)  Final   Report Status PENDING  Incomplete         Radiology Studies: Dg Chest 1 View  Result Date: 09/21/2017 CLINICAL DATA:  Fall. EXAM: CHEST  1 VIEW COMPARISON:  Chest x-ray dated July 19, 2017. FINDINGS: The heart is at the upper  limits of normal in size. Normal pulmonary vascularity. Atherosclerotic calcification of the aortic arch. No focal consolidation, pleural effusion, or pneumothorax. Chronic lower thoracic vertebral body compression deformities are unchanged. Partially visualized right humeral neck fracture. IMPRESSION: 1.  No active cardiopulmonary disease. 2. Right humeral neck fracture. Electronically Signed   By: Titus Dubin M.D.   On: 09/21/2017 17:27   Dg Lumbar Spine Complete  Result Date: 09/21/2017 CLINICAL DATA:  Back pain after fall. EXAM: LUMBAR SPINE - COMPLETE 4+ VIEW COMPARISON:  Lumbar spine x-rays dated January 23, 2014. FINDINGS: New mild height loss along the right aspect of the L5 superior endplate. Chronic mild compression deformities of T10 through T12 are unchanged dating back to chest x-ray from June 2019. Chronic severe L1, mild L2, and moderate L3 compression deformities are unchanged dating back to 2016. Alignment is normal. Mild disc height  loss at L4-L5, unchanged. The sacroiliac joints are unremarkable. Aortoiliac atherosclerotic vascular disease. IMPRESSION: 1. New mild height loss along the right aspect of the L5 superior endplate consistent with age-indeterminate fracture. Correlate with point tenderness. 2. Chronic T10 through L3 compression deformities are not significantly changed. Electronically Signed   By: Titus Dubin M.D.   On: 09/21/2017 17:34   Dg Shoulder Right  Result Date: 09/21/2017 CLINICAL DATA:  Fall. EXAM: RIGHT HUMERUS - 2+ VIEW; RIGHT SHOULDER - 2+ VIEW COMPARISON:  None. FINDINGS: Impacted fracture of the proximal humerus surgical neck. No dislocation. Mild osteoarthritis of the acromioclavicular joint. Osteopenia. Soft tissues are unremarkable. IMPRESSION: Impacted proximal humeral neck fracture. Electronically Signed   By: Titus Dubin M.D.   On: 09/21/2017 17:38   Dg Elbow Complete Right  Result Date: 09/21/2017 CLINICAL DATA:  Fall. EXAM: RIGHT ELBOW - COMPLETE 3+ VIEW COMPARISON:  None. FINDINGS: There is no evidence of fracture, dislocation, or joint effusion. There is no evidence of arthropathy or other focal bone abnormality. Soft tissues are unremarkable. IMPRESSION: Negative. Electronically Signed   By: Titus Dubin M.D.   On: 09/21/2017 17:36   Dg Wrist Complete Right  Result Date: 09/21/2017 CLINICAL DATA:  Fall. EXAM: RIGHT WRIST - COMPLETE 3+ VIEW COMPARISON:  None. FINDINGS: No acute fracture or dislocation. Moderate scaphotrapeziotrapezoid and first [place] joint osteoarthritis. Severe osteopenia. Chondrocalcinosis of the TFCC. Soft tissues are unremarkable. IMPRESSION: 1.  No acute osseous abnormality. Electronically Signed   By: Titus Dubin M.D.   On: 09/21/2017 17:35   Ct Head Wo Contrast  Result Date: 09/21/2017 CLINICAL DATA:  Pt was found on the floor this morning says she fell out of bed about 10am and could not get to the phone EXAM: CT HEAD WITHOUT CONTRAST CT CERVICAL SPINE  WITHOUT CONTRAST TECHNIQUE: Multidetector CT imaging of the head and cervical spine was performed following the standard protocol without intravenous contrast. Multiplanar CT image reconstructions of the cervical spine were also generated. COMPARISON:  Head CT 08/25/2014 FINDINGS: CT HEAD FINDINGS Brain: No acute intracranial hemorrhage. No focal mass lesion. No CT evidence of acute infarction. No midline shift or mass effect. No hydrocephalus. Basilar cisterns are patent. There are periventricular and subcortical white matter hypodensities. Generalized cortical atrophy. Vascular: No hyperdense vessel or unexpected calcification. Skull: Normal. Negative for fracture or focal lesion. Sinuses/Orbits: Paranasal sinuses and mastoid air cells are clear. Orbits are clear. Other: None. CT spine findings : Alignment: Normal alignment of vertebral bodies. Skull base and vertebrae: Normal craniocervical junction. No loss of vertebral body height or disc height. Normal facet articulation. No evidence of fracture.  Soft tissues and spinal canal: No prevertebral soft tissue swelling. No perispinal or epidural hematoma. Disc levels:  Mild endplate spurring and joint space narrowing. Upper chest: Clear Other: 3.7 cm nodule of the LEFT lobe of thyroid gland. IMPRESSION: 1. No intracranial trauma. 2. Atrophy and white matter microvascular disease. 3. No cervical spine fracture. 4. Large nodule of the LEFT lobe of thyroid gland. Recommend follow-up thyroid ultrasound for further characterization. Electronically Signed   By: Suzy Bouchard M.D.   On: 09/21/2017 18:15   Ct Cervical Spine Wo Contrast  Result Date: 09/21/2017 CLINICAL DATA:  Pt was found on the floor this morning says she fell out of bed about 10am and could not get to the phone EXAM: CT HEAD WITHOUT CONTRAST CT CERVICAL SPINE WITHOUT CONTRAST TECHNIQUE: Multidetector CT imaging of the head and cervical spine was performed following the standard protocol without  intravenous contrast. Multiplanar CT image reconstructions of the cervical spine were also generated. COMPARISON:  Head CT 08/25/2014 FINDINGS: CT HEAD FINDINGS Brain: No acute intracranial hemorrhage. No focal mass lesion. No CT evidence of acute infarction. No midline shift or mass effect. No hydrocephalus. Basilar cisterns are patent. There are periventricular and subcortical white matter hypodensities. Generalized cortical atrophy. Vascular: No hyperdense vessel or unexpected calcification. Skull: Normal. Negative for fracture or focal lesion. Sinuses/Orbits: Paranasal sinuses and mastoid air cells are clear. Orbits are clear. Other: None. CT spine findings : Alignment: Normal alignment of vertebral bodies. Skull base and vertebrae: Normal craniocervical junction. No loss of vertebral body height or disc height. Normal facet articulation. No evidence of fracture. Soft tissues and spinal canal: No prevertebral soft tissue swelling. No perispinal or epidural hematoma. Disc levels:  Mild endplate spurring and joint space narrowing. Upper chest: Clear Other: 3.7 cm nodule of the LEFT lobe of thyroid gland. IMPRESSION: 1. No intracranial trauma. 2. Atrophy and white matter microvascular disease. 3. No cervical spine fracture. 4. Large nodule of the LEFT lobe of thyroid gland. Recommend follow-up thyroid ultrasound for further characterization. Electronically Signed   By: Suzy Bouchard M.D.   On: 09/21/2017 18:15   Ct Shoulder Right Wo Contrast  Result Date: 09/22/2017 CLINICAL DATA:  The patient suffered a fall out of bed 09/21/2017 with a right upper arm injury. Fracture on plain films. Initial encounter. EXAM: CT OF THE UPPER RIGHT EXTREMITY WITHOUT CONTRAST TECHNIQUE: Multidetector CT imaging of the upper right extremity was performed according to the standard protocol. COMPARISON:  Plain films right shoulder 09/21/2017. Single-view of the chest 07/19/2017. FINDINGS: Bones/Joint/Cartilage The patient has an  impacted surgical neck fracture of the right humerus. There is 1 shaft width anterior displacement. The fracture does not involve the greater or lesser tuberosities. Fracture margins are partially corticated and there appears to be some callus formation present about the fracture. No bridging bone is identified. The acromioclavicular joint is intact and the humeral head is located. There is mild acromioclavicular osteoarthritis. Remote T4 and T5 compression fractures incidentally noted. Ligaments Suboptimally assessed by CT. Muscles and Tendons Musculature of the shoulder girdle appears preserved. Soft tissues There is stranding in subcutaneous fat over the shoulder. No focal fluid collection or mass. Imaged lung parenchyma demonstrates mild dependent atelectasis. IMPRESSION: Impacted transverse surgical neck fracture of the humerus demonstrates 1 shaft width anterior displacement and does not involve the greater or lesser tuberosities. The fracture is new since the 07/19/2017 plain film of the chest but has imaging features compatible with subacute injury. Electronically Signed   By: Marcello Moores  Dalessio M.D.   On: 09/22/2017 08:04   Pelvis Portable  Result Date: 09/22/2017 CLINICAL DATA:  Postop right hip arthroplasty. EXAM: PORTABLE PELVIS 1-2 VIEWS COMPARISON:  MRI 04/13/2014, lumbar spine radiographs 01/23/2014 FINDINGS: New cemented right bipolar hip arthroplasty without immediate postoperative complication nor hardware failure is noted. Soft tissue emphysema, expected from recent surgery is identified. Remote left inferior pubic ramus fracture is identified with healing. Calcified densities projecting over the upper pelvis compatible with calcified lymph nodes, stable in appearance since 2016 comparisons. IMPRESSION: 1. No immediate postoperative complications status post cemented right bipolar hip arthroplasty. 2. Remote left inferior pubic ramus fracture with healing is noted. 3. Calcified upper pelvic  masses compatible with calcified lymph nodes unchanged. Electronically Signed   By: Ashley Royalty M.D.   On: 09/22/2017 21:01   Dg Humerus Right  Result Date: 09/21/2017 CLINICAL DATA:  Fall. EXAM: RIGHT HUMERUS - 2+ VIEW; RIGHT SHOULDER - 2+ VIEW COMPARISON:  None. FINDINGS: Impacted fracture of the proximal humerus surgical neck. No dislocation. Mild osteoarthritis of the acromioclavicular joint. Osteopenia. Soft tissues are unremarkable. IMPRESSION: Impacted proximal humeral neck fracture. Electronically Signed   By: Titus Dubin M.D.   On: 09/21/2017 17:38   Dg Hip Unilat W Or Wo Pelvis 2-3 Views Right  Result Date: 09/21/2017 CLINICAL DATA:  Right hip pain after fall. EXAM: DG HIP (WITH OR WITHOUT PELVIS) 2-3V RIGHT COMPARISON:  Right hip x-rays dated January 23, 2014. FINDINGS: Acute mildly displaced fracture of the right femoral neck. No dislocation. Mild right hip osteoarthritis. Osteopenia. IMPRESSION: Acute right femoral neck fracture. Electronically Signed   By: Titus Dubin M.D.   On: 09/21/2017 17:25        Scheduled Meds: . acetaminophen  1,000 mg Oral Q8H  . docusate sodium  100 mg Oral BID  . enoxaparin (LOVENOX) injection  40 mg Subcutaneous Q24H  . latanoprost  1 drop Both Eyes QHS  . levothyroxine  88 mcg Oral QAC breakfast  . loratadine  10 mg Oral Daily  . senna  1 tablet Oral BID  . timolol  1 drop Both Eyes BID   Continuous Infusions: . sodium chloride 75 mL/hr at 09/23/17 1124  . ciprofloxacin 400 mg (09/23/17 0019)  . lactated ringers 10 mL/hr at 09/22/17 1533  . methocarbamol (ROBAXIN) IV       LOS: 2 days    Time spent:25 mins. More than 50% of that time was spent in counseling and/or coordination of care.      Shelly Coss, MD [facility] Pager [phone]  If 7PM-7AM, please contact night-coverage [email or web address] Password [facility] 09/23/2017, 12:32 PM

## 2017-09-23 NOTE — Progress Notes (Signed)
Physical Therapy Evaluation Patient Details Name: Stephanie Donovan MRN: [id number] DOB: February 22, 1927 Today's Date: 09/23/2017   History of Present Illness  Pt is an 84 y.o. female s/p right arthroplasty bipolar hip. Pt is also presenting with a closed R humeral fracture which is being treated conservatively with a sling for comfort and is NWB on RUE. Pt's RLE is s/p posterior approach and is WBAT. PMH is significant of hypothyroidism, TIA, paroxysmal A.fib, diastolic CHF.   Clinical Impression  Patient is s/p above surgery resulting in functional limitations due to the deficits listed below (see PT Problem List). At the time of evaluation pr required mod A +2 for bed mobility, secondary to hip and shoulder pain. Initially transferred to [facility] with Max A+2 as pt demonstrated difficulty bearing weight through RLE. Utlized stedy to chair with min A +1 in order to keep R foot on footplate and to control descent to chair. Pt is appropriate to utilize stedy with staff for safety. Educated pt and daughter on hip precautions and positioning. Notified RN that pt was left on room air with O2 sats at 95% at end of session. Recommending SNF at discharge due to decrease caregiver support and to improve safety with mobility. Will continue to follow and progress pt acutely to allow discharge to the venue listed below.      Follow Up Recommendations SNF;Supervision/Assistance - 24 hour    Equipment Recommendations       Recommendations for Other Services OT consult     Precautions / Restrictions Precautions Precautions: Posterior Hip;Fall;Shoulder Type of Shoulder Precautions: NWB Shoulder Interventions: Shoulder sling/immobilizer;For comfort Precaution Booklet Issued: Yes (comment) Precaution Comments: Reviewed precautions in full with pt and family member Restrictions Weight Bearing Restrictions: Yes RUE Weight Bearing: Non weight bearing RLE Weight Bearing: Weight bearing as tolerated       Mobility  Bed Mobility Overal bed mobility: Needs Assistance Bed Mobility: Supine to Sit     Supine to sit: Mod assist;+2 for physical assistance;HOB elevated     General bed mobility comments: Pt required cues for hand placement onto rail with LUE; required mod A+2 for trunk elevation and lowering of R LE EOB; Verbal and tactile cues to maintain post. hip precautions   Transfers Overall transfer level: Needs assistance Equipment used: 1 person hand held assist Transfers: Sit to/from Stand;Stand Pivot Transfers Sit to Stand: Mod assist;+2 physical assistance Stand pivot transfers: Max assist;+2 physical assistance;Min assist       General transfer comment: Initially required Mod A to stand and cues to shift weight onto RLE as tolerated; Max A+2 required to pivot to [facility] with pt utizing hand held assist onto student therapist forarm, demonstrating decreased weight shift onto RLE after cues secondary to pain; pt unable to utilize RW due to RUE NWB; utlized stedy from [employer] with pt performing sit>stand with min assist requiring cues to pull from stedy bar and to maintain R foot on footplate.    Ambulation/Gait             General Gait Details: unable  Stairs            Wheelchair Mobility    Modified Rankin (Stroke Patients Only)       Balance Overall balance assessment: Needs assistance Sitting-balance support: Single extremity supported;Feet supported Sitting balance-Leahy Scale: Fair Sitting balance - Comments: Pt utlizing 1 UE support while seated demonstrating a weight shift onto left side secondary to R hip pain   Standing balance support: Single extremity supported;During  functional activity Standing balance-Leahy Scale: Poor Standing balance comment: Pt unable to stand statically without mod-max A as pt can only perform with 1 UE support                              Pertinent Vitals/Pain Pain Assessment: Faces Faces Pain Scale: Hurts  even more Pain Location: R hip, R shoulder Pain Descriptors / Indicators: Discomfort;Grimacing;Operative site guarding;Aching Pain Intervention(s): Limited activity within patient's tolerance;Monitored during session;Repositioned    Home Living Family/patient expects to be discharged to:: Skilled nursing facility                      Prior Function                 Hand Dominance   Dominant Hand: Right    Extremity/Trunk Assessment   Upper Extremity Assessment Upper Extremity Assessment: RUE deficits/detail RUE Deficits / Details: s/p closed humeral fx(treating conservatively with sling -NWB)    Lower Extremity Assessment Lower Extremity Assessment: RLE deficits/detail RLE Deficits / Details: s/p hemiarthroplasty R hip    Cervical / Trunk Assessment Cervical / Trunk Assessment: Normal  Communication      Cognition Arousal/Alertness: Awake/alert Behavior During Therapy: WFL for tasks assessed/performed Overall Cognitive Status: Within Functional Limits for tasks assessed                                        General Comments General comments (skin integrity, edema, etc.): Pts daughter entered in the middle of the session and remained engaged     Exercises Total Joint Exercises Ankle Circles/Pumps: AROM;10 reps;Seated(ankle pumps only ) Quad Sets: AROM;10 reps;Seated Hip ABduction/ADduction: AAROM;10 reps;Seated(towel assist to elevate leg)   Assessment/Plan    PT Assessment Patient needs continued PT services  PT Problem List Decreased strength;Decreased range of motion;Decreased activity tolerance;Decreased balance;Decreased mobility;Decreased coordination;Decreased knowledge of use of DME;Decreased safety awareness;Decreased knowledge of precautions;Pain       PT Treatment Interventions DME instruction;Gait training;Functional mobility training;Therapeutic activities;Therapeutic exercise;Balance training;Patient/family  education;Modalities    PT Goals (Current goals can be found in the Care Plan section)  Acute Rehab PT Goals Patient Stated Goal: get better PT Goal Formulation: With patient Time For Goal Achievement: 09/30/17 Potential to Achieve Goals: Fair    Frequency Min 5X/week   Barriers to discharge        Co-evaluation               AM-PAC PT "6 Clicks" Daily Activity  Outcome Measure Difficulty turning over in bed (including adjusting bedclothes, sheets and blankets)?: Unable Difficulty moving from lying on back to sitting on the side of the bed? : Unable Difficulty sitting down on and standing up from a chair with arms (e.g., wheelchair, bedside commode, etc,.)?: Unable Help needed moving to and from a bed to chair (including a wheelchair)?: Total Help needed walking in hospital room?: Total Help needed climbing 3-5 steps with a railing? : Total 6 Click Score: 6    End of Session Equipment Utilized During Treatment: Gait belt Activity Tolerance: Patient limited by pain Patient left: in chair;with call bell/phone within reach;with chair alarm set;with family/visitor present Nurse Communication: Mobility status;Need for lift equipment PT Visit Diagnosis: Unsteadiness on feet (R26.81);Other abnormalities of gait and mobility (R26.89);Muscle weakness (generalized) (M62.81);Difficulty in walking, not elsewhere classified (R26.2);Pain Pain -  Right/Left: Right Pain - part of body: Shoulder;Hip    Time: 0164-2903 PT Time Calculation (min) (ACUTE ONLY): 44 min   Charges:   PT Evaluation $PT Eval Moderate Complexity: 1 Mod PT Treatments $Gait Training: 23-37 mins        Einar Crow, [place]  Student Physical Therapist Acute Rehab [phone]   Einar Crow 09/23/2017, 1:43 PM

## 2017-09-23 NOTE — Plan of Care (Signed)

## 2017-09-23 NOTE — Progress Notes (Signed)
Orthopaedic Trauma Progress Note  S: Patient doing well this morning.  Pain is well controlled.  She is up with therapy.  Denies any chest pain or shortness of breath.  O:  Vitals:   09/23/17 0006 09/23/17 0459  BP: (!) 92/55 95/66  Pulse: 85 80  Resp: 16 16  Temp:  97.6 F (36.4 C)  SpO2: 99% 99%    General: No acute distress, awake alert and oriented x3. Right upper extremity in sling.  She is neurovascularly intact. Right lower extremity: Dressing is clean dry and intact.  Compartments soft and compressible.  Active dorsiflexion plantarflexion.  Warm well-perfused foot.  Imaging: Stable postop imaging  Labs:  Results for orders placed or performed during the hospital encounter of 09/21/17 (from the past 24 hour(s))  CBC with Differential/Platelet     Status: Abnormal   Collection Time: 09/23/17  3:46 AM  Result Value Ref Range   WBC 11.5 (H) 4.0 - 10.5 K/uL   RBC 2.98 (L) 3.87 - 5.11 MIL/uL   Hemoglobin 8.5 (L) 12.0 - 15.0 g/dL   HCT 26.5 (L) 36.0 - 46.0 %   MCV 88.9 78.0 - 100.0 fL   MCH 28.5 26.0 - 34.0 pg   MCHC 32.1 30.0 - 36.0 g/dL   RDW 13.8 11.5 - 15.5 %   Platelets 134 (L) 150 - 400 K/uL   Neutrophils Relative % 90 %   Neutro Abs 10.4 (H) 1.7 - 7.7 K/uL   Lymphocytes Relative 4 %   Lymphs Abs 0.5 (L) 0.7 - 4.0 K/uL   Monocytes Relative 5 %   Monocytes Absolute 0.5 0.1 - 1.0 K/uL   Eosinophils Relative 0 %   Eosinophils Absolute 0.0 0.0 - 0.7 K/uL   Basophils Relative 0 %   Basophils Absolute 0.0 0.0 - 0.1 K/uL   Immature Granulocytes 1 %   Abs Immature Granulocytes 0.1 0.0 - 0.1 K/uL  Basic metabolic panel     Status: Abnormal   Collection Time: 09/23/17  3:46 AM  Result Value Ref Range   Sodium 134 (L) 135 - 145 mmol/L   Potassium 4.5 3.5 - 5.1 mmol/L   Chloride 105 98 - 111 mmol/L   CO2 25 22 - 32 mmol/L   Glucose, Bld 120 (H) 70 - 99 mg/dL   BUN 16 8 - 23 mg/dL   Creatinine, Ser 0.77 0.44 - 1.00 mg/dL   Calcium 8.0 (L) 8.9 - 10.3 mg/dL   GFR  calc non Af Amer >60 >60 mL/min   GFR calc Af Amer >60 >60 mL/min   Anion gap 4 (L) 5 - 15    Assessment: 84 year old female s/p fall  Injuries: 1. Right displaced femoral neck fracture s/p right hip hemiarthroplasty 2. Right proximal humerus fracture  Weightbearing: WBAT RLE w/ posterior hip precautions, NWB RUE  Insicional and dressing care: Per Dr. Griffin Basil  Orthopedic device(s):None needed  CV/Blood loss:Hgb 8.5 from 9.9, likely acute blood loss anemia. Continue to monitor. Patient has been slightly hypotensive overnight  Pain management: 1. Oxycodone 5-10 mg q 4hrs PRN 2. Morphine 0.5mg  q 2hr PRN 3. Robaxin 500 mg q6 hours PRN 4. Tylenol scheduled 1000mg  q 8 hours  VTE prophylaxis: Lovenox 40 mg to start today  ID: Clindamycin postoperative prophylaxis  Foley/Lines: Currently NS at 104ml/hr  Medical co-morbidities: 1. CHF-stable, per hospitalist 2. A-fib-per hospitalist 3. Hypothyroidism 4. Glaucoma-home meds  Dispo: Pt/OT eval likely SNF  Follow - up plan: Per Dr. Mardene Sayer  Bobette Mo, MD [facility] [phone] (phone)

## 2017-09-23 NOTE — Plan of Care (Signed)
  Problem: Pain Management: Goal: Pain level will decrease Outcome: Progressing   

## 2017-09-23 NOTE — Evaluation (Signed)
Occupational Therapy Evaluation Patient Details Name: Stephanie Donovan MRN: [id number] DOB: [birth year] Today's Date: 09/23/2017    History of Present Illness Pt is an 84 y.o. female s/p right arthroplasty bipolar hip. Pt also presenting with a closed R humeral fracture being treated conservatively with a sling for comfort. RUE is NWB and RLE is WBAT s/p posterior approach. PMH is significant of hypothyroidism, TIA, paroxysmal A.fib, diastolic CHF.   Clinical Impression   PTA, pt was independent with ADL and functional mobility and living alone. She is currently limited by R hip and R shoulder pain. She requires use of the Stedy and min-mod assist for toilet transfers to [facility] and total assist for LB ADL and toileting hygiene. Pt additionally demonstrates decreased awareness, attention, and memory requiring multiple repetitions of education concerning posterior hip precautions. At current functional level, recommend SNF level rehabilitation post-acute D/C to maximize return to PLOF. Will continue to follow while admitted.    Follow Up Recommendations  Supervision/Assistance - 24 hour;SNF    Equipment Recommendations  Other (comment)(defer to next venue of care)    Recommendations for Other Services       Precautions / Restrictions Precautions Precautions: Fall;Shoulder;Posterior Hip Type of Shoulder Precautions: NWB Shoulder Interventions: Shoulder sling/immobilizer;For comfort Precaution Booklet Issued: Yes (comment) Precaution Comments: Reviewed precautions multiple times with pt unable to recall.  Restrictions Weight Bearing Restrictions: Yes RUE Weight Bearing: Non weight bearing RLE Weight Bearing: Weight bearing as tolerated      Mobility Bed Mobility Overal bed mobility: Needs Assistance Bed Mobility: Sit to Supine       Sit to supine: Mod assist   General bed mobility comments: Assist to manage BLE while adhering to posterior hip precautions.   Transfers Overall  transfer level: Needs assistance   Transfers: Sit to/from Stand Sit to Stand: Min assist;Mod assist         General transfer comment: Mod assist initially to power up to standing in [place] frame. On second attempt, requiring min assist. She requires cues to avoid use of RUE to support herself.     Balance Overall balance assessment: Needs assistance Sitting-balance support: Single extremity supported;Feet supported Sitting balance-Leahy Scale: Fair     Standing balance support: Single extremity supported;During functional activity Standing balance-Leahy Scale: Poor Standing balance comment: Relies on LUE support and external assistance                           ADL either performed or assessed with clinical judgement   ADL Overall ADL's : Needs assistance/impaired Eating/Feeding: Sitting;Minimal assistance Eating/Feeding Details (indicate cue type and reason): min assist for bimanual tasks Grooming: Minimal assistance;Sitting Grooming Details (indicate cue type and reason): assist for bimanual tasks Upper Body Bathing: Sitting;Maximal assistance   Lower Body Bathing: Sit to/from stand;Total assistance   Upper Body Dressing : Maximal assistance;Sitting   Lower Body Dressing: Total assistance;Sit to/from stand   Toilet Transfer: Moderate assistance;Minimal assistance(using Stedy) Armed forces technical officer Details (indicate cue type and reason): Mod assist on initial stand from chair but min assist from [employer].  Toileting- Clothing Manipulation and Hygiene: Total assistance;Sit to/from stand         General ADL Comments: Pt requiring significant assistance for all ADL participation.      Vision Patient Visual Report: No change from baseline Vision Assessment?: No apparent visual deficits     Perception     Praxis      Pertinent Vitals/Pain Pain Assessment: Faces Faces  Pain Scale: Hurts even more Pain Location: R hip, R shoulder during movement Pain Descriptors /  Indicators: Discomfort;Grimacing;Operative site guarding;Aching Pain Intervention(s): Limited activity within patient's tolerance;Monitored during session;Repositioned     Hand Dominance Right   Extremity/Trunk Assessment Upper Extremity Assessment Upper Extremity Assessment: RUE deficits/detail RUE Deficits / Details: Closed humeral fracture being managed conservatively with sling and NWB. Able to move digits and wrist. Required AAROM R elbow due to pain.   Lower Extremity Assessment Lower Extremity Assessment: Defer to PT evaluation   Cervical / Trunk Assessment Cervical / Trunk Assessment: Normal   Communication Communication Communication: No difficulties   Cognition Arousal/Alertness: Awake/alert Behavior During Therapy: WFL for tasks assessed/performed Overall Cognitive Status: No family/caregiver present to determine baseline cognitive functioning Area of Impairment: Attention;Awareness                   Current Attention Level: Selective       Awareness: Emergent   General Comments: Unable to recall posterior hip precautions despite multiple rounds of education. Noted decreased attention to tasks, tangential conversation, and    General Comments  No family present during session.     Exercises     Shoulder Instructions      Home Living Family/patient expects to be discharged to:: Skilled nursing facility Living Arrangements: Alone Available Help at Discharge: Family;Available PRN/intermittently Type of Home: House Home Access: Stairs to enter Entrance Stairs-Number of Steps: 1   Home Layout: Two level Alternate Level Stairs-Number of Steps: Chair lift up stairs   Bathroom Shower/Tub: Teacher, early years/pre: Standard     Home Equipment: Environmental consultant - 4 wheels;Cane - single point          Prior Functioning/Environment Level of Independence: Independent                 OT Problem List: Decreased strength;Decreased range of  motion;Decreased activity tolerance;Impaired balance (sitting and/or standing);Decreased safety awareness;Decreased knowledge of use of DME or AE;Decreased knowledge of precautions;Pain      OT Treatment/Interventions: Self-care/ADL training;Therapeutic exercise;Energy conservation;DME and/or AE instruction;Therapeutic activities;Patient/family education;Balance training;Cognitive remediation/compensation    OT Goals(Current goals can be found in the care plan section) Acute Rehab OT Goals Patient Stated Goal: get better OT Goal Formulation: With patient Time For Goal Achievement: 10/07/17 Potential to Achieve Goals: Good ADL Goals Pt Will Perform Grooming: (P) with supervision;sitting Pt Will Perform Lower Body Dressing: (P) with min assist;sit to/from stand Pt Will Transfer to Toilet: (P) with min assist;ambulating;bedside commode Pt Will Perform Toileting - Clothing Manipulation and hygiene: (P) with min assist;sit to/from stand Pt/caregiver will Perform Home Exercise Program: (P) Right Upper extremity;With written HEP provided;Independently;Increased ROM(elbow, wrist, hand AROM)  OT Frequency: Min 2X/week   Barriers to D/C:            Co-evaluation              AM-PAC PT "6 Clicks" Daily Activity     Outcome Measure Help from another person eating meals?: A Little Help from another person taking care of personal grooming?: A Little Help from another person toileting, which includes using toliet, bedpan, or urinal?: A Lot Help from another person bathing (including washing, rinsing, drying)?: A Lot Help from another person to put on and taking off regular upper body clothing?: A Lot Help from another person to put on and taking off regular lower body clothing?: Total 6 Click Score: 13   End of Session Equipment Utilized During Treatment: Charlaine Dalton) Nurse  Communication: Mobility status;Other (comment)(pt requesting eye drops)  Activity Tolerance: Patient tolerated treatment  well Patient left: in bed;with call bell/phone within reach  OT Visit Diagnosis: Other abnormalities of gait and mobility (R26.89);Pain Pain - Right/Left: Right Pain - part of body: Arm;Hip                Time: 4707-6151 OT Time Calculation (min): 33 min Charges:  OT General Charges $OT Visit: 1 Visit OT Evaluation $OT Eval Moderate Complexity: 1 Mod OT Treatments $Self Care/Home Management : 8-22 mins  Norman Herrlich, MS OTR/L  Acute Rehabilitation Services Pager [phone] Office [place] A Savian Mazon 09/23/2017, 5:21 PM

## 2017-09-23 NOTE — Anesthesia Postprocedure Evaluation (Signed)
Anesthesia Post Note  Patient: Stephanie Donovan  Procedure(s) Performed: ARTHROPLASTY BIPOLAR HIP (HEMIARTHROPLASTY) (Right Hip)     Patient location during evaluation: PACU Anesthesia Type: General Level of consciousness: awake and patient cooperative Pain management: pain level controlled Vital Signs Assessment: post-procedure vital signs reviewed and stable Respiratory status: spontaneous breathing, nonlabored ventilation, respiratory function stable and patient connected to nasal cannula oxygen Cardiovascular status: blood pressure returned to baseline and stable Postop Assessment: no apparent nausea or vomiting Anesthetic complications: no    Last Vitals:  Vitals:   09/22/17 1944 09/23/17 0006  BP: 118/68 (!) 92/55  Pulse: 97 85  Resp: 14 16  Temp:    SpO2: 98% 99%    Last Pain:  Vitals:   09/22/17 1900  TempSrc:   PainSc: Asleep                 Merlen Gurry

## 2017-09-24 LAB — URINE CULTURE

## 2017-09-24 LAB — VITAMIN D 25 HYDROXY (VIT D DEFICIENCY, FRACTURES): VIT D 25 HYDROXY: 47 ng/mL (ref 30.0–100.0)

## 2017-09-24 MED ORDER — POLYETHYLENE GLYCOL 3350 17 G PO PACK: 17.0000 g | PACK | Freq: Every day | ORAL | 0 refills | Status: DC | PRN

## 2017-09-24 MED ORDER — OXYCODONE HCL 5 MG PO TABS: ORAL_TABLET | ORAL | 0 refills | Status: DC

## 2017-09-24 MED ORDER — ENOXAPARIN SODIUM 40 MG/0.4ML ~~LOC~~ SOLN: 40.0000 mg | SUBCUTANEOUS | 1 refills | Status: DC

## 2017-09-24 MED ORDER — ACETAMINOPHEN 500 MG PO TABS: 1000.0000 mg | ORAL_TABLET | Freq: Three times a day (TID) | ORAL | 0 refills | Status: AC

## 2017-09-24 NOTE — Discharge Instructions (Signed)
Nehan Flaum MD, MPH °[facility] °[address], [address] °[phone] (tel)   °[phone] (fax) ° °POST-OPERATIVE INSTRUCTIONS ° °WOUND CARE °? You may remove the operative dressing 7 days postop and leave open to air afterwards °? KEEP THE INCISIONS CLEAN AND DRY. °? You may shower on Post-Op Day #2. Gently pat the area dry.  The dressing is waterproof. Do not soak the knee in water. Do not go swimming in the pool or ocean until 4 weeks after surgery or when otherwise instructed. ° °EXERCISES °? You may bear weight as tolerated on your operative extremity. °? When in bed and while sleeping it is imperative you wear your knee immobilizer until we instruct you otherwise while in clinic. °? It is essential that you follow your posterior hip precautions.  °o Do not sit in a low chair. (No hip flexion >90 degrees) °o Do not cross your legs.  (No Adduction past neutral) °o Keep your toes pointed straight ahead at all times (No internal rotation or excessive external rotation) °? Therapy may be performed by an outpatient therapist or at a rehab facility ° °POST-OP MEDICINES °? A multi-modal approach will be used to treat your pain. °• Oxycodone - This is a strong narcotic, to be used only on an “as needed” basis for pain. °• Acetaminophen - A non-narcotic pain medicine.  Use 1000mg three times a day for the first 14 days after surgery. °• Lovenox 40mg -  This is a medicine used to help prevent blood clots.  Please use it daily until your primary doctor can transition you to a more long term oral medicine. °? If you have any adverse effects with the medications, please call our office. ° °FOLLOW-UP °? If you develop a Fever (>101.5), Redness or Drainage from the surgical incision site, please call our office to arrange for an evaluation. °? Please call the office to schedule a follow-up appointment for your suture removal, 10-14 days post-operatively. °IF YOU HAVE ANY QUESTIONS, PLEASE FEEL FREE  TO CALL OUR OFFICE. ° ° °

## 2017-09-24 NOTE — Progress Notes (Signed)
[facility] transported patient going to [facility];stable at the time of discharge

## 2017-09-24 NOTE — Progress Notes (Signed)
Patient discharging to [facility]. Report called in to [facility] LPN. Awaiting transportation.

## 2017-09-24 NOTE — Progress Notes (Signed)
Physical Therapy Treatment Patient Details Name: Stephanie Donovan MRN: [id number] DOB: 05-Apr-1927 Today's Date: 09/24/2017    History of Present Illness Pt is an 84 y.o. female s/p right arthroplasty bipolar hip. Pt also presenting with a closed R humeral fracture being treated conservatively with a sling for comfort. RUE is NWB and RLE is WBAT s/p posterior approach. PMH is significant of hypothyroidism, TIA, paroxysmal A.fib, diastolic CHF.    PT Comments    Pt continues to be limited in post-op mobility secondary to pain in R hip and RUE. Pt performed sit<>stand with gross min G with use of stedy bar. Pt demonstrating improvements elevating RLE without assist at time of session. Educated pt on precautions and encouraged pt take handouts to next venue of care.  See next venue of care for recommendations on AD. Will continue to follow acutely while admitted.    Follow Up Recommendations  SNF;Supervision/Assistance - 24 hour     Equipment Recommendations       Recommendations for Other Services       Precautions / Restrictions Precautions Precautions: Fall;Shoulder;Posterior Hip Type of Shoulder Precautions: NWB Shoulder Interventions: Shoulder sling/immobilizer;For comfort Precaution Comments: Reviewed precautions with pt; pt unable to recall without cues  Required Braces or Orthoses: Sling Restrictions Weight Bearing Restrictions: Yes RUE Weight Bearing: Non weight bearing RLE Weight Bearing: Weight bearing as tolerated    Mobility  Bed Mobility Overal bed mobility: Needs Assistance Bed Mobility: Sit to Supine     Supine to sit: Mod assist;HOB elevated     General bed mobility comments: Pt required VCs for hand placement and lowering LEs; mod A required to elevate trunk and scoot EOB; pt demonstrated the ability to bring RLE off of bed without assist at todays session  Transfers Overall transfer level: Needs assistance   Transfers: Sit to/from Stand Sit to Stand:  Min guard;+2 safety/equipment         General transfer comment: Min G for safety and technique; pt able to power up without assist at todays session; continues to require tactile cue to position R foot onto footplate; pt demonstrating the ability to power up with use of stedy bar without requiring cues; demonstrating better weight shift onto RLE  Ambulation/Gait             General Gait Details: unable   Stairs             Wheelchair Mobility    Modified Rankin (Stroke Patients Only)       Balance Overall balance assessment: Needs assistance Sitting-balance support: Single extremity supported;Feet supported Sitting balance-Leahy Scale: Fair Sitting balance - Comments: Pt utlizing 1 UE support while seated demonstrating a weight shift onto left side secondary to R hip pain   Standing balance support: Single extremity supported;During functional activity Standing balance-Leahy Scale: Poor Standing balance comment: Relies on LUE support and external assistance for static and dynamic standing activities.                            Cognition Arousal/Alertness: Awake/alert Behavior During Therapy: WFL for tasks assessed/performed Overall Cognitive Status: No family/caregiver present to determine baseline cognitive functioning                                 General Comments: Unable to recall posterior hip precautions despite multiple rounds of education      Exercises Total  Joint Exercises Short Arc Quad: AROM;10 reps;Seated Heel Slides: AROM;10 reps;Supine Other Exercises Other Exercises: hamstring set (2-3 sec hold)(pressing heel down into leg rest of chair)    General Comments General comments (skin integrity, edema, etc.): No family present at time of session.Nurse tech present throughout session to observe transfer with stedy, in order to assist pt back to bed with stedy when appropriate.      Pertinent Vitals/Pain Pain  Assessment: Faces Faces Pain Scale: Hurts little more Pain Location: R hip, R shoulder during movement Pain Descriptors / Indicators: Discomfort;Grimacing;Operative site guarding;Sore Pain Intervention(s): Limited activity within patient's tolerance;Monitored during session;Repositioned    Home Living                      Prior Function            PT Goals (current goals can now be found in the care plan section) Acute Rehab PT Goals Patient Stated Goal: get better PT Goal Formulation: With patient Time For Goal Achievement: 09/30/17 Potential to Achieve Goals: Fair Progress towards PT goals: Progressing toward goals    Frequency    Min 3X/week      PT Plan Current plan remains appropriate    Co-evaluation              AM-PAC PT "6 Clicks" Daily Activity  Outcome Measure  Difficulty turning over in bed (including adjusting bedclothes, sheets and blankets)?: Unable Difficulty moving from lying on back to sitting on the side of the bed? : Unable Difficulty sitting down on and standing up from a chair with arms (e.g., wheelchair, bedside commode, etc,.)?: Unable Help needed moving to and from a bed to chair (including a wheelchair)?: Total Help needed walking in hospital room?: Total Help needed climbing 3-5 steps with a railing? : Total 6 Click Score: 6    End of Session Equipment Utilized During Treatment: Gait belt Activity Tolerance: Patient tolerated treatment well Patient left: in chair;with call bell/phone within reach;with chair alarm set Nurse Communication: Mobility status PT Visit Diagnosis: Unsteadiness on feet (R26.81);Other abnormalities of gait and mobility (R26.89);Muscle weakness (generalized) (M62.81);Difficulty in walking, not elsewhere classified (R26.2);Pain Pain - Right/Left: Right Pain - part of body: Shoulder;Hip     Time: 9678-9381 PT Time Calculation (min) (ACUTE ONLY): 30 min  Charges:  $Gait Training: 8-22  mins $Therapeutic Exercise: 8-22 mins                     Einar Crow, [place]  Student Physical Therapist Acute Rehab [phone]    Einar Crow 09/24/2017, 3:12 PM

## 2017-09-24 NOTE — Plan of Care (Signed)
  Problem: Education: Goal: Verbalization of understanding the information provided (i.e., activity precautions, restrictions, etc) will improve Outcome: Progressing   Problem: Activity: Goal: Ability to ambulate and perform ADLs will improve Outcome: Progressing   Problem: Pain Management: Goal: Pain level will decrease Outcome: Progressing   Problem: Safety: Goal: Ability to remain free from injury will improve Outcome: Progressing   

## 2017-09-24 NOTE — Clinical Social Work Placement (Signed)
   CLINICAL SOCIAL WORK PLACEMENT  NOTE  Date:  09/24/2017  Patient Details  Name: Stephanie Donovan MRN: [id number] Date of Birth: January 03, 1928  Clinical Social Work is seeking post-discharge placement for this patient at the [place] level of care (*CSW will initial, date and re-position this form in  chart as items are completed):  Yes   Patient/family provided with [facility] Work Department's list of facilities offering this level of care within the geographic area requested by the patient (or if unable, by the patient's family).  Yes   Patient/family informed of their freedom to choose among providers that offer the needed level of care, that participate in Medicare, Medicaid or managed care program needed by the patient, have an available bed and are willing to accept the patient.  Yes   Patient/family informed of [facility]'s ownership interest in [facility] and [facility], as well as of the fact that they are under no obligation to receive care at these facilities.  PASRR submitted to EDS on       PASRR number received on       Existing PASRR number confirmed on 09/23/17     FL2 transmitted to all facilities in geographic area requested by pt/family on 09/23/17     FL2 transmitted to all facilities within larger geographic area on       Patient informed that his/her managed care company has contracts with or will negotiate with certain facilities, including the following:            Patient/family informed of bed offers received.  Patient chooses bed at [facility]     Physician recommends and patient chooses bed at      Patient to be transferred to [facility] on 09/24/17.  Patient to be transferred to facility by [facility]     Patient family notified on 09/24/17 of transfer.  Name of family member notified:  Daughter- [place]     PHYSICIAN       Additional Comment:     _______________________________________________ Vinie Sill, [place] 09/24/2017, 2:22 PM

## 2017-09-24 NOTE — Discharge Summary (Signed)
Physician Discharge Summary  Stephanie Donovan WRU:[id number] DOB: [birth year] DOA: 09/21/2017  PCP[facility] [place] date: 09/21/2017 Discharge date: 09/24/2017  Admitted From: Home Disposition:  SNF  Discharge Condition:Stable CODE STATUS:FULL Diet recommendation: Heart Healthy  Brief/Interim Summary: Patient is 84 year old female with past medical history of hypothyroidism, TIA, paroxysmal A. fib, diastolic CHF who presented from home to the emergency department after she fell from her bed.  Imagings done on presentation showed close displaced fracture of right femoral neck and closed fracture of right proximal humerus.  Orthopedics consulted. Underwent right hemiarthroplasty .  Awaiting PT/OT recommended skilled nursing facility on discharge. She is stable for discharge to skilled nursing facility today.  She will follow-up with orthopedics as an outpatient in 2 weeks.  Following problems were addressed during her hospitalization:   Closed displaced fracture of right femoral neck/Impacted proximal humeral neck fracture.: Underwent right-sided hip hemiarthroplasty .  Plan for conservative management for the fracture of the humerus.  Currently she has a sling.  DVT prophylaxis with Lovenox.  Pain management.  Follow up with orthopedics in 2 weeks.  Hyponatremia: Stable  Elevated CK: Improved.Most likely secondary to fall.    Thyroid nodule: TSH normal. CT cervical spine showed large nodule of the LEFT lobe of thyroid gland. [place] follow-up as an outpatient.  Further investigation of this large thyroid nodule in this 84 year old female is questionable.  Hypothyroidism: Continue Synthyroid.  Paroxysmal A. fib: Not on anticoagulation most likely secondary to advanced age and risk for falls.  Currently rate is controlled.  Not on beta-blockers.  Chronic CHF: Currently compensated.  Appeared dry on presentation and was given IV fluids.  History of  glaucoma: Continue home meds.  History of recent UTI: On oral antibiotics at home.    UA done here was not impressive of UTI.  Hypokalemia: Supplemented and corrected.     Discharge Diagnoses:  Active Problems:   Hypothyroidism   Chronic constipation   Paroxysmal A-fib (HCC)   Closed displaced fracture of right femoral neck (HCC)   Closed fracture of right proximal humerus   Dehydration   Hyponatremia   Elevated CK   Thyroid nodule   Closed right hip fracture (HCC)   Chronic diastolic CHF (congestive heart failure) (HCC)   Sinus tachycardia   Acute lower UTI    Discharge Instructions  Discharge Instructions    Diet - low sodium heart healthy   Complete by:  As directed    Discharge instructions   Complete by:  As directed    1)Follow up with orthopedics as an outpatient in 2 weeks.  Name and number of the provider has been attached.   Increase activity slowly   Complete by:  As directed      Allergies as of 09/24/2017      Reactions   Aspirin Palpitations   Mixed reactions Mixed reactions   Epinephrine Other (See Comments)   Rapped heart beat   Penicillins    Lost hearing temporarily   Codeine Palpitations      Medication List    STOP taking these medications   HYDROcodone-acetaminophen 5-325 MG tablet Commonly known as:  NORCO/VICODIN   sulfamethoxazole-trimethoprim 800-160 MG tablet Commonly known as:  BACTRIM DS,SEPTRA DS     TAKE these medications   acetaminophen 500 MG tablet Commonly known as:  TYLENOL Take 2 tablets (1,000 mg total) by mouth every 8 (eight) hours for 14 days.   atorvastatin 20 MG tablet Commonly known as:  LIPITOR Take 1 tablet (20 mg total) by mouth daily.   calcium-vitamin D 500-200 MG-UNIT tablet Commonly known as:  OSCAL WITH D Take 1 tablet by mouth every morning.   cetirizine 10 MG tablet Commonly known as:  ZYRTEC Take 10 mg by mouth daily.   clopidogrel 75 MG tablet Commonly known as:  PLAVIX Take 1  tablet (75 mg total) by mouth daily.   diclofenac sodium 1 % Gel Commonly known as:  VOLTAREN Place 2 g onto the skin 4 (four) times daily as needed (for knee pain).   enoxaparin 40 MG/0.4ML injection Commonly known as:  LOVENOX Inject 0.4 mLs (40 mg total) into the skin daily.   latanoprost 0.005 % ophthalmic solution Commonly known as:  XALATAN Place 1 drop into both eyes at bedtime.   levothyroxine 88 MCG tablet Commonly known as:  SYNTHROID, LEVOTHROID Take 88 mcg by mouth daily.   oxyCODONE 5 MG immediate release tablet Commonly known as:  Oxy IR/ROXICODONE Take 1 pills every 4-6 hrs as needed for pain   polyethylene glycol packet Commonly known as:  MIRALAX / GLYCOLAX Take 17 g by mouth daily as needed for mild constipation.   timolol 0.5 % ophthalmic solution Commonly known as:  TIMOPTIC Place 1 drop into both eyes 2 (two) times daily.      Follow-up Information    [name], MD. Schedule an appointment as soon as possible for a visit in 2 week(s).   Specialty:  Orthopedic Surgery Contact information: [address] [place] [address] [phone]          Allergies  Allergen Reactions  . Aspirin Palpitations    Mixed reactions Mixed reactions  . Epinephrine Other (See Comments)    Rapped heart beat  . Penicillins     Lost hearing temporarily  . Codeine Palpitations    Consultations:  Orthopedics   Procedures/Studies: Dg Chest 1 View  Result Date: 09/21/2017 CLINICAL DATA:  Fall. EXAM: CHEST  1 VIEW COMPARISON:  Chest x-ray dated July 19, 2017. FINDINGS: The heart is at the upper limits of normal in size. Normal pulmonary vascularity. Atherosclerotic calcification of the aortic arch. No focal consolidation, pleural effusion, or pneumothorax. Chronic lower thoracic vertebral body compression deformities are unchanged. Partially visualized right humeral neck fracture. IMPRESSION: 1.  No active cardiopulmonary disease. 2. Right  humeral neck fracture. Electronically Signed   By: Titus Dubin M.D.   On: 09/21/2017 17:27   Dg Lumbar Spine Complete  Result Date: 09/21/2017 CLINICAL DATA:  Back pain after fall. EXAM: LUMBAR SPINE - COMPLETE 4+ VIEW COMPARISON:  Lumbar spine x-rays dated January 23, 2014. FINDINGS: New mild height loss along the right aspect of the L5 superior endplate. Chronic mild compression deformities of T10 through T12 are unchanged dating back to chest x-ray from June 2019. Chronic severe L1, mild L2, and moderate L3 compression deformities are unchanged dating back to 2016. Alignment is normal. Mild disc height loss at L4-L5, unchanged. The sacroiliac joints are unremarkable. Aortoiliac atherosclerotic vascular disease. IMPRESSION: 1. New mild height loss along the right aspect of the L5 superior endplate consistent with age-indeterminate fracture. Correlate with point tenderness. 2. Chronic T10 through L3 compression deformities are not significantly changed. Electronically Signed   By: Titus Dubin M.D.   On: 09/21/2017 17:34   Dg Shoulder Right  Result Date: 09/21/2017 CLINICAL DATA:  Fall. EXAM: RIGHT HUMERUS - 2+ VIEW; RIGHT SHOULDER - 2+ VIEW COMPARISON:  None. FINDINGS: Impacted  fracture of the proximal humerus surgical neck. No dislocation. Mild osteoarthritis of the acromioclavicular joint. Osteopenia. Soft tissues are unremarkable. IMPRESSION: Impacted proximal humeral neck fracture. Electronically Signed   By: Titus Dubin M.D.   On: 09/21/2017 17:38   Dg Elbow Complete Right  Result Date: 09/21/2017 CLINICAL DATA:  Fall. EXAM: RIGHT ELBOW - COMPLETE 3+ VIEW COMPARISON:  None. FINDINGS: There is no evidence of fracture, dislocation, or joint effusion. There is no evidence of arthropathy or other focal bone abnormality. Soft tissues are unremarkable. IMPRESSION: Negative. Electronically Signed   By: Titus Dubin M.D.   On: 09/21/2017 17:36   Dg Wrist Complete Right  Result Date:  09/21/2017 CLINICAL DATA:  Fall. EXAM: RIGHT WRIST - COMPLETE 3+ VIEW COMPARISON:  None. FINDINGS: No acute fracture or dislocation. Moderate scaphotrapeziotrapezoid and first [place] joint osteoarthritis. Severe osteopenia. Chondrocalcinosis of the TFCC. Soft tissues are unremarkable. IMPRESSION: 1.  No acute osseous abnormality. Electronically Signed   By: Titus Dubin M.D.   On: 09/21/2017 17:35   Ct Head Wo Contrast  Result Date: 09/21/2017 CLINICAL DATA:  Pt was found on the floor this morning says she fell out of bed about 10am and could not get to the phone EXAM: CT HEAD WITHOUT CONTRAST CT CERVICAL SPINE WITHOUT CONTRAST TECHNIQUE: Multidetector CT imaging of the head and cervical spine was performed following the standard protocol without intravenous contrast. Multiplanar CT image reconstructions of the cervical spine were also generated. COMPARISON:  Head CT 08/25/2014 FINDINGS: CT HEAD FINDINGS Brain: No acute intracranial hemorrhage. No focal mass lesion. No CT evidence of acute infarction. No midline shift or mass effect. No hydrocephalus. Basilar cisterns are patent. There are periventricular and subcortical white matter hypodensities. Generalized cortical atrophy. Vascular: No hyperdense vessel or unexpected calcification. Skull: Normal. Negative for fracture or focal lesion. Sinuses/Orbits: Paranasal sinuses and mastoid air cells are clear. Orbits are clear. Other: None. CT spine findings : Alignment: Normal alignment of vertebral bodies. Skull base and vertebrae: Normal craniocervical junction. No loss of vertebral body height or disc height. Normal facet articulation. No evidence of fracture. Soft tissues and spinal canal: No prevertebral soft tissue swelling. No perispinal or epidural hematoma. Disc levels:  Mild endplate spurring and joint space narrowing. Upper chest: Clear Other: 3.7 cm nodule of the LEFT lobe of thyroid gland. IMPRESSION: 1. No intracranial trauma. 2. Atrophy and white  matter microvascular disease. 3. No cervical spine fracture. 4. Large nodule of the LEFT lobe of thyroid gland. Recommend follow-up thyroid ultrasound for further characterization. Electronically Signed   By: Suzy Bouchard M.D.   On: 09/21/2017 18:15   Ct Cervical Spine Wo Contrast  Result Date: 09/21/2017 CLINICAL DATA:  Pt was found on the floor this morning says she fell out of bed about 10am and could not get to the phone EXAM: CT HEAD WITHOUT CONTRAST CT CERVICAL SPINE WITHOUT CONTRAST TECHNIQUE: Multidetector CT imaging of the head and cervical spine was performed following the standard protocol without intravenous contrast. Multiplanar CT image reconstructions of the cervical spine were also generated. COMPARISON:  Head CT 08/25/2014 FINDINGS: CT HEAD FINDINGS Brain: No acute intracranial hemorrhage. No focal mass lesion. No CT evidence of acute infarction. No midline shift or mass effect. No hydrocephalus. Basilar cisterns are patent. There are periventricular and subcortical white matter hypodensities. Generalized cortical atrophy. Vascular: No hyperdense vessel or unexpected calcification. Skull: Normal. Negative for fracture or focal lesion. Sinuses/Orbits: Paranasal sinuses and mastoid air cells are clear. Orbits are clear. Other:  None. CT spine findings : Alignment: Normal alignment of vertebral bodies. Skull base and vertebrae: Normal craniocervical junction. No loss of vertebral body height or disc height. Normal facet articulation. No evidence of fracture. Soft tissues and spinal canal: No prevertebral soft tissue swelling. No perispinal or epidural hematoma. Disc levels:  Mild endplate spurring and joint space narrowing. Upper chest: Clear Other: 3.7 cm nodule of the LEFT lobe of thyroid gland. IMPRESSION: 1. No intracranial trauma. 2. Atrophy and white matter microvascular disease. 3. No cervical spine fracture. 4. Large nodule of the LEFT lobe of thyroid gland. Recommend follow-up thyroid  ultrasound for further characterization. Electronically Signed   By: Suzy Bouchard M.D.   On: 09/21/2017 18:15   Ct Shoulder Right Wo Contrast  Result Date: 09/22/2017 CLINICAL DATA:  The patient suffered a fall out of bed 09/21/2017 with a right upper arm injury. Fracture on plain films. Initial encounter. EXAM: CT OF THE UPPER RIGHT EXTREMITY WITHOUT CONTRAST TECHNIQUE: Multidetector CT imaging of the upper right extremity was performed according to the standard protocol. COMPARISON:  Plain films right shoulder 09/21/2017. Single-view of the chest 07/19/2017. FINDINGS: Bones/Joint/Cartilage The patient has an impacted surgical neck fracture of the right humerus. There is 1 shaft width anterior displacement. The fracture does not involve the greater or lesser tuberosities. Fracture margins are partially corticated and there appears to be some callus formation present about the fracture. No bridging bone is identified. The acromioclavicular joint is intact and the humeral head is located. There is mild acromioclavicular osteoarthritis. Remote T4 and T5 compression fractures incidentally noted. Ligaments Suboptimally assessed by CT. Muscles and Tendons Musculature of the shoulder girdle appears preserved. Soft tissues There is stranding in subcutaneous fat over the shoulder. No focal fluid collection or mass. Imaged lung parenchyma demonstrates mild dependent atelectasis. IMPRESSION: Impacted transverse surgical neck fracture of the humerus demonstrates 1 shaft width anterior displacement and does not involve the greater or lesser tuberosities. The fracture is new since the 07/19/2017 plain film of the chest but has imaging features compatible with subacute injury. Electronically Signed   By: Inge Rise M.D.   On: 09/22/2017 08:04   Pelvis Portable  Result Date: 09/22/2017 CLINICAL DATA:  Postop right hip arthroplasty. EXAM: PORTABLE PELVIS 1-2 VIEWS COMPARISON:  MRI 04/13/2014, lumbar spine  radiographs 01/23/2014 FINDINGS: New cemented right bipolar hip arthroplasty without immediate postoperative complication nor hardware failure is noted. Soft tissue emphysema, expected from recent surgery is identified. Remote left inferior pubic ramus fracture is identified with healing. Calcified densities projecting over the upper pelvis compatible with calcified lymph nodes, stable in appearance since 2016 comparisons. IMPRESSION: 1. No immediate postoperative complications status post cemented right bipolar hip arthroplasty. 2. Remote left inferior pubic ramus fracture with healing is noted. 3. Calcified upper pelvic masses compatible with calcified lymph nodes unchanged. Electronically Signed   By: Ashley Royalty M.D.   On: 09/22/2017 21:01   Dg Humerus Right  Result Date: 09/21/2017 CLINICAL DATA:  Fall. EXAM: RIGHT HUMERUS - 2+ VIEW; RIGHT SHOULDER - 2+ VIEW COMPARISON:  None. FINDINGS: Impacted fracture of the proximal humerus surgical neck. No dislocation. Mild osteoarthritis of the acromioclavicular joint. Osteopenia. Soft tissues are unremarkable. IMPRESSION: Impacted proximal humeral neck fracture. Electronically Signed   By: Titus Dubin M.D.   On: 09/21/2017 17:38   Dg Hip Unilat W Or Wo Pelvis 2-3 Views Right  Result Date: 09/21/2017 CLINICAL DATA:  Right hip pain after fall. EXAM: DG HIP (WITH OR WITHOUT PELVIS) 2-3V  RIGHT COMPARISON:  Right hip x-rays dated January 23, 2014. FINDINGS: Acute mildly displaced fracture of the right femoral neck. No dislocation. Mild right hip osteoarthritis. Osteopenia. IMPRESSION: Acute right femoral neck fracture. Electronically Signed   By: Titus Dubin M.D.   On: 09/21/2017 17:25       Subjective: Patient seen and examined at bedside this morning.  Remains comfortable.  Hemodynamically  stable for discharge today.  Discharge Exam: Vitals:   09/23/17 1931 09/24/17 0426  BP: 110/61 120/68  Pulse: (!) 112 95  Resp: 16 16  Temp: 98.2 F (36.8  C) 98 F (36.7 C)  SpO2: (!) 79% 95%   Vitals:   09/23/17 1231 09/23/17 1540 09/23/17 1931 09/24/17 0426  BP: (!) 92/53 106/61 110/61 120/68  Pulse: 90 80 (!) 112 95  Resp:   16 16  Temp: 98.3 F (36.8 C)  98.2 F (36.8 C) 98 F (36.7 C)  TempSrc: Oral  Oral Oral  SpO2: 96% 97% (!) 79% 95%  Weight:      Height:        General: Pt is alert, awake, not in acute distress Cardiovascular: RRR, S1/S2 +, no rubs, no gallops Respiratory: CTA bilaterally, no wheezing, no rhonchi Abdominal: Soft, NT, ND, bowel sounds + Extremities: no edema, no cyanosis, laying on the right arm, clean surgical wound on the right hip    The results of significant diagnostics from this hospitalization (including imaging, microbiology, ancillary and laboratory) are listed below for reference.     Microbiology: Recent Results (from the past 240 hour(s))  Surgical PCR screen     Status: None   Collection Time: 09/22/17 12:57 AM  Result Value Ref Range Status   MRSA, PCR NEGATIVE NEGATIVE Final   Staphylococcus aureus NEGATIVE NEGATIVE Final    Comment: (NOTE) The Xpert SA Assay (FDA approved for NASAL specimens in patients 64 years of age and older), is one component of a comprehensive surveillance program. It is not intended to diagnose infection nor to guide or monitor treatment. Performed at [facility], [place] [address], [place], [place] [address]   Urine Culture     Status: Abnormal   Collection Time: 09/22/17  1:19 AM  Result Value Ref Range Status   Specimen Description URINE, CLEAN CATCH  Final   Special Requests   Final    NONE Performed at [facility], [place] [address] [place], [place] [address]    Culture 20,000 COLONIES/mL ESCHERICHIA COLI (A)  Final   Report Status 09/24/2017 FINAL  Final   Organism ID, Bacteria ESCHERICHIA COLI (A)  Final      Susceptibility   Escherichia coli - MIC*    AMPICILLIN 4 SENSITIVE Sensitive     CEFAZOLIN <=4 SENSITIVE Sensitive      CEFTRIAXONE <=1 SENSITIVE Sensitive     CIPROFLOXACIN <=0.25 SENSITIVE Sensitive     GENTAMICIN <=1 SENSITIVE Sensitive     IMIPENEM <=0.25 SENSITIVE Sensitive     NITROFURANTOIN <=16 SENSITIVE Sensitive     TRIMETH/SULFA >=320 RESISTANT Resistant     AMPICILLIN/SULBACTAM <=2 SENSITIVE Sensitive     PIP/TAZO <=4 SENSITIVE Sensitive     Extended ESBL NEGATIVE Sensitive     * 20,000 COLONIES/mL ESCHERICHIA COLI     Labs: BNP (last 3 results) No results for input(s): BNP in the last 8760 hours. Basic Metabolic Panel: Recent Labs  Lab 09/21/17 1522 09/22/17 0709 09/23/17 0346  NA 132* 139 134*  K 3.9 3.1* 4.5  CL 98 111 105  CO2 23 22 25   GLUCOSE 184* 89 120*  BUN 15 13 16   CREATININE 0.94 0.62 0.77  CALCIUM 8.8* 6.9* 8.0*   Liver Function Tests: Recent Labs  Lab 09/22/17 0709  ALBUMIN 2.4*   No results for input(s): LIPASE, AMYLASE in the last 168 hours. No results for input(s): AMMONIA in the last 168 hours. CBC: Recent Labs  Lab 09/21/17 1522 09/22/17 0709 09/23/17 0346  WBC 14.7* 11.9* 11.5*  NEUTROABS 13.3*  --  10.4*  HGB 12.2 9.9* 8.5*  HCT 37.0 29.8* 26.5*  MCV 87.9 90.3 88.9  PLT 190 141* 134*   Cardiac Enzymes: Recent Labs  Lab 09/21/17 1522 09/21/17 2000 09/22/17 0236 09/22/17 0709 09/23/17 0346  CKTOTAL 1,197*  --   --  1,160* 545*  TROPONINI  --  <0.03 0.03* 0.03*  --    BNP: Invalid input(s): POCBNP CBG: No results for input(s): GLUCAP in the last 168 hours. D-Dimer No results for input(s): DDIMER in the last 72 hours. Hgb A1c No results for input(s): HGBA1C in the last 72 hours. Lipid Profile No results for input(s): CHOL, HDL, LDLCALC, TRIG, CHOLHDL, LDLDIRECT in the last 72 hours. Thyroid function studies Recent Labs    09/22/17 0236  TSH 0.481   Anemia work up No results for input(s): VITAMINB12, FOLATE, FERRITIN, TIBC, IRON, RETICCTPCT in the last 72 hours. Urinalysis    Component Value Date/Time   COLORURINE YELLOW  09/21/2017 1530   APPEARANCEUR CLEAR 09/21/2017 1530   LABSPEC 1.018 09/21/2017 1530   PHURINE 6.0 09/21/2017 1530   GLUCOSEU NEGATIVE 09/21/2017 1530   HGBUR NEGATIVE 09/21/2017 1530   BILIRUBINUR NEGATIVE 09/21/2017 1530   KETONESUR 5 (A) 09/21/2017 1530   PROTEINUR 30 (A) 09/21/2017 1530   UROBILINOGEN 0.2 08/25/2014 0454   NITRITE NEGATIVE 09/21/2017 1530   LEUKOCYTESUR NEGATIVE 09/21/2017 1530   Sepsis Labs Invalid input(s): PROCALCITONIN,  WBC,  LACTICIDVEN Microbiology Recent Results (from the past 240 hour(s))  Surgical PCR screen     Status: None   Collection Time: 09/22/17 12:57 AM  Result Value Ref Range Status   MRSA, PCR NEGATIVE NEGATIVE Final   Staphylococcus aureus NEGATIVE NEGATIVE Final    Comment: (NOTE) The Xpert SA Assay (FDA approved for NASAL specimens in patients 25 years of age and older), is one component of a comprehensive surveillance program. It is not intended to diagnose infection nor to guide or monitor treatment. Performed at [facility], [place] [address] [place], [place] [address]   Urine Culture     Status: Abnormal   Collection Time: 09/22/17  1:19 AM  Result Value Ref Range Status   Specimen Description URINE, CLEAN CATCH  Final   Special Requests   Final    NONE Performed at [facility], [place] [address], [place], [place] [address]    Culture 20,000 COLONIES/mL ESCHERICHIA COLI (A)  Final   Report Status 09/24/2017 FINAL  Final   Organism ID, Bacteria ESCHERICHIA COLI (A)  Final      Susceptibility   Escherichia coli - MIC*    AMPICILLIN 4 SENSITIVE Sensitive     CEFAZOLIN <=4 SENSITIVE Sensitive     CEFTRIAXONE <=1 SENSITIVE Sensitive     CIPROFLOXACIN <=0.25 SENSITIVE Sensitive     GENTAMICIN <=1 SENSITIVE Sensitive     IMIPENEM <=0.25 SENSITIVE Sensitive     NITROFURANTOIN <=16 SENSITIVE Sensitive     TRIMETH/SULFA >=320 RESISTANT Resistant     AMPICILLIN/SULBACTAM <=2  SENSITIVE Sensitive     PIP/TAZO <=4  SENSITIVE Sensitive     Extended ESBL NEGATIVE Sensitive     * 20,000 COLONIES/mL ESCHERICHIA COLI    Please note: You were cared for by a hospitalist during your hospital stay. Once you are discharged, your primary care physician will handle any further medical issues. Please note that NO REFILLS for any discharge medications will be authorized once you are discharged, as it is imperative that you return to your primary care physician (or establish a relationship with a primary care physician if you do not have one) for your post hospital discharge needs so that they can reassess your need for medications and monitor your lab values.    Time coordinating discharge: 40 minutes  SIGNED:   Shelly Coss, MD  [facility] 09/24/2017, 11:50 AM Pager [phone]  If 7PM-7AM, please contact night-coverage [email or web address] Password [email or web address]

## 2017-09-24 NOTE — Care Management Important Message (Signed)
Important Message  Patient Details  Name: Stephanie Donovan MRN: [id number] Date of Birth: 04-Mar-1927   Medicare Important Message Given:  Yes    Orbie Pyo 09/24/2017, 4:08 PM

## 2017-09-24 NOTE — Progress Notes (Signed)
Pt will DC to: [place] date: 09/24/2017 Family notified: Daughter [place]- CSW left voice message for daughter- patient is alert and oriented. Patient states she will contact her daughter and have her to meet her at [facility]. Transport by: Corey Harold   RN, patient, and facility notified of DC. Discharge Summary sent to facility. RN given number for report. DC packet on chart [phone] room 908P . Ambulance transport requested for patient.   CSW signing off. Thurmond Butts, [place] Social Worker [phone]

## 2017-09-25 ENCOUNTER — Encounter (HOSPITAL_COMMUNITY): Payer: Self-pay | Admitting: Orthopaedic Surgery

## 2017-10-08 ENCOUNTER — Encounter (HOSPITAL_COMMUNITY): Payer: Self-pay | Admitting: *Deleted

## 2017-10-08 NOTE — Progress Notes (Signed)
Anesthesia Chart Review: SAME DAY WORKUP   Case:  [id number] Date/Time:  10/09/17 1045   Procedure:  REVERSE SHOULDER ARTHROPLASTY (Right )   Anesthesia type:  Choice   Pre-op diagnosis:  right proximal humerus fracture   Location:  [facility] OR ROOM 06 / [place] [facility]   Surgeon:  [name], MD      DISCUSSION: 84yo female for above procedure. Pertinent hx includes hypothyroidism, TIA, paroxysmal A. fib, diastolic CHF.  Pt recently hospitalized 8/31-09/24/2017 for closed displaced fracture of right femoral neck/impacted proximal right humeral neck fracture. She underwent right hip hemiarthroplasty. She was placed in a sling for humeral fracture. Dr. Rich Fuchs Op note 09/22/2017 indicates pt will be on lovenox for 6 weeks. Per note by PAT nurse Dolphus Jenny, labs were drawn on [facility]11at SNF and hgb was 7.7; Lovenox was evidently stopped due to anemia and weakness.  A repeat Hgb was drawn on 9/13 which resulted hgb 7.6.  Hemoglobin redrawn today 10/08/2017, results not yet available. I spoke with Sherri at Dr. Rich Fuchs office. She stated that Dr. Griffin Basil is waiting for today's result before making a decision on whether or not to proceed with he case. He did move to case from first to second to allow time for receipt/review of results.  Discussed case with Dr. Suzette Battiest. Will order CBC DOS and pt will need to be evaluated by assigned anesthesiologist. Hgb < 8.0 will require discussion about safety of proceeding with surgery.   VS: There were no vitals taken for this visit.  PROVIDERS: Suzanna Obey, MD is PCP   LABS: Will order DOS CBC to eval H/H  IMAGES: CHEST  1 VIEW 09/21/2017  COMPARISON:  Chest x-ray dated July 19, 2017.  FINDINGS: The heart is at the upper limits of normal in size. Normal pulmonary vascularity. Atherosclerotic calcification of the aortic arch. No focal consolidation, pleural effusion, or pneumothorax. Chronic lower thoracic vertebral body compression deformities are  unchanged. Partially visualized right humeral neck fracture.  IMPRESSION: 1.  No active cardiopulmonary disease. 2. Right humeral neck fracture.   EKG: 09/22/2017: Normal sinus rhythm. Nonspecific ST abnormality  CV: TTE 08/26/2014: Study Conclusions  - Left ventricle: The cavity size was normal. Systolic function was   normal. The estimated ejection fraction was in the range of 55%   to 60%. Wall motion was normal; there were no regional wall   motion abnormalities. Doppler parameters are consistent with   abnormal left ventricular relaxation (grade 1 diastolic   dysfunction). - Aortic valve: There was trivial regurgitation.  Past Medical History:  Diagnosis Date  . Blood transfusion without reported diagnosis   . Chronic back pain   . Chronic constipation   . Dysrhythmia    Afib with RVR - 2015, Oaroxyomal Afib  . GI bleed   . Glaucoma   . Syncope 2015  . Thyroid disease   . TIA (transient ischemic attack) 2015    Past Surgical History:  Procedure Laterality Date  . COLONOSCOPY    . HIP ARTHROPLASTY Right 09/22/2017   Procedure: ARTHROPLASTY BIPOLAR HIP (HEMIARTHROPLASTY);  Surgeon: [name], MD;  Location: [facility];  Service: Orthopedics;  Laterality: Right;  . JOINT REPLACEMENT  11/20/2011   LTKR    MEDICATIONS: No current facility-administered medications for this encounter.    Marland Kitchen atorvastatin (LIPITOR) 20 MG tablet  . cetirizine (ZYRTEC) 10 MG tablet  . docusate sodium (COLACE) 100 MG capsule  . latanoprost (XALATAN) 0.005 % ophthalmic solution  . levothyroxine (SYNTHROID,  LEVOTHROID) 88 MCG tablet  . Lidocaine (ASPERCREME LIDOCAINE) 4 % PTCH  . polyethylene glycol (MIRALAX / GLYCOLAX) packet  . timolol (TIMOPTIC) 0.5 % ophthalmic solution  . acetaminophen (TYLENOL) 500 MG tablet  . clopidogrel (PLAVIX) 75 MG tablet  . enoxaparin (LOVENOX) 40 MG/0.4ML injection  . oxyCODONE (OXY IR/ROXICODONE) 5 MG immediate release tablet     Wynonia Musty [facility]  Short Stay Center/Anesthesiology Phone [phone] 10/08/2017 4:27 PM

## 2017-10-08 NOTE — Progress Notes (Signed)
OR time changed to 1100, I called and spoke to [place] at [place], who reported that they were notified by [facility] and arrival time is 0845, I said actually it is at 0830.  The scheduler for [facility] has left ,, the time cannot be changed at this time.

## 2017-10-08 NOTE — Pre-Procedure Instructions (Signed)
    Stephanie Donovan  10/08/2017      Report to [facility] Admitting at 6:30 A.M.,  Wednesday, September 18   Call this number if you have problems the morning of surgery: [phone]  This is the number for the Pre- Surgical Desk.                      >>>>>>>>>>>>Please send patient medication record with medication given documented.<<<<<<<<<<<<<<<<     Remember:  Do not eat or drink after midnight, Tuesday, September 9.                 Take these medicines the morning of surgery with A SIP OF WATER : cetirizine (ZYRTEC)  levothyroxine (SYNTHROID, LEVOTHROID)   Eye Drops    Do not wear jewelry, make-up or nail polish.  Do not wear lotions, powders, or perfumes, or deodorant.  Do not shave 48 hours prior to surgery.  Men may shave face and neck.  Do not bring valuables to the hospital.  [facility] is not responsible for any belongings or valuables.  Contacts, dentures or bridgework may not be worn into surgery.  Leave your suitcase in the car.  After surgery it may be brought to your room.

## 2017-10-08 NOTE — Anesthesia Preprocedure Evaluation (Addendum)
Anesthesia Evaluation  Patient identified by MRN, date of birth, ID band Patient awake    Reviewed: Allergy & Precautions, NPO status , Patient's Chart, lab work & pertinent test results  Airway Mallampati: II  TM Distance: >3 FB Neck ROM: Full    Dental no notable dental hx. (+) Poor Dentition, Dental Advisory Given,    Pulmonary    Pulmonary exam normal breath sounds clear to auscultation       Cardiovascular Exercise Tolerance: Good hypertension, Pt. on medications +CHF  Normal cardiovascular exam+ dysrhythmias  Rhythm:Regular Rate:Normal  TTE 08/26/2014: Study Conclusions  - Left ventricle: The cavity size was normal. Systolic function was normal. The estimated ejection fraction was in the range of 55% to 60%. Wall motion was normal; there were no regional wall motion abnormalities. Doppler parameters are consistent with abnormal left ventricular relaxation (grade 1 diastolic dysfunction).   Neuro/Psych TIAnegative psych ROS   GI/Hepatic   Endo/Other  Hypothyroidism   Renal/GU      Musculoskeletal   Abdominal   Peds  Hematology  (+) anemia ,   Anesthesia Other Findings   Reproductive/Obstetrics                            Lab Results  Component Value Date   WBC 5.7 10/09/2017   HGB 9.8 (L) 10/09/2017   HCT 30.9 (L) 10/09/2017   MCV 95.4 10/09/2017   PLT 342 10/09/2017    Lab Results  Component Value Date   WBC 11.5 (H) 09/23/2017   HGB 8.5 (L) 09/23/2017   HCT 26.5 (L) 09/23/2017   MCV 88.9 09/23/2017   PLT 134 (L) 09/23/2017    Anesthesia Physical Anesthesia Plan  ASA: III  Anesthesia Plan: General   Post-op Pain Management:  Regional for Post-op pain   Induction: Intravenous  PONV Risk Score and Plan: 3 and Treatment may vary due to age or medical condition, Ondansetron and Dexamethasone  Airway Management Planned: Oral ETT  Additional Equipment:    Intra-op Plan:   Post-operative Plan: Extubation in OR  Informed Consent: I have reviewed the patients History and Physical, chart, labs and discussed the procedure including the risks, benefits and alternatives for the proposed anesthesia with the patient or authorized representative who has indicated his/her understanding and acceptance.   Dental advisory given  Plan Discussed with:   Anesthesia Plan Comments:         Anesthesia Quick Evaluation

## 2017-10-08 NOTE — Progress Notes (Addendum)
[place] and asked to speak to patient's nurse- no answer.  I called [facility] a second time and was sent to DON voice mail, I left a message with following questions: Has labs been drawn since she arrived there; was patient to be on Lovenox and refused (surgeon's notes indicate 5 weeks of lovenox). I called Sherri Gavin's voice mail asking for orders and to inquire about Lovenox.

## 2017-10-08 NOTE — Progress Notes (Addendum)
I called [place]ky , Dr Rich Fuchs scheduler and informed her of HGB of 7.7 drawn on 9/7.   I called [facility] and asked for Mrs [place] nurse and did not get a response. I called [facility] and spoke Ms [place], [place] and asked about repeat labs drawn today.  MS Collins said that the labs would not be back until after she leaves, but she wuill have someone fax them to me.  I asked MS Theda Sers if patient had labs drawn before 9/12, she said no, that patient was becoming weaker and labs were drawn on 9/11 and hemoglobin was 7.7; Lovenox was stopped due to patient being weaker and that patient was ambulatory.  A repeat Hgb was drawn on 9/13 , it was 7.6.  MS Collins reported that the labs from 9/11 were sent with patient when she went to her follow up appointment with Dr Griffin Basil. I called Derek Jack , Dr Rich Fuchs scheduler and informed her that patient had been symptomatic and blood count was drawn 9/11- results of 7.7 and Lovenox was held at that time.  I also informed Sherri that labs will not be back until later.  I also informed Karoline Caldwell, PA of the above.

## 2017-10-09 ENCOUNTER — Inpatient Hospital Stay (HOSPITAL_COMMUNITY): Payer: Medicare Other

## 2017-10-09 ENCOUNTER — Encounter (HOSPITAL_COMMUNITY)
Admission: RE | Disposition: A | Discharge status: Skilled Nursing Facility | Payer: Self-pay | Source: Home / Self Care | Attending: Orthopaedic Surgery

## 2017-10-09 ENCOUNTER — Inpatient Hospital Stay (HOSPITAL_COMMUNITY): Payer: Medicare Other | Admitting: Physician Assistant

## 2017-10-09 ENCOUNTER — Other Ambulatory Visit: Payer: Self-pay

## 2017-10-09 ENCOUNTER — Inpatient Hospital Stay (HOSPITAL_COMMUNITY)
Admission: RE | Admit: 2017-10-09 | Discharge: 2017-10-10 | DRG: 483 | Disposition: A | Discharge status: Skilled Nursing Facility | Payer: Medicare Other | Attending: Orthopaedic Surgery | Admitting: Orthopaedic Surgery

## 2017-10-09 ENCOUNTER — Encounter (HOSPITAL_COMMUNITY): Payer: Self-pay | Admitting: *Deleted

## 2017-10-09 DIAGNOSIS — E039 Hypothyroidism, unspecified: Secondary | ICD-10-CM | POA: Diagnosis not present

## 2017-10-09 DIAGNOSIS — Z800 Family history of malignant neoplasm of digestive organs: Secondary | ICD-10-CM | POA: Diagnosis not present

## 2017-10-09 DIAGNOSIS — Z79891 Long term (current) use of opiate analgesic: Secondary | ICD-10-CM

## 2017-10-09 DIAGNOSIS — Z888 Allergy status to other drugs, medicaments and biological substances status: Secondary | ICD-10-CM

## 2017-10-09 DIAGNOSIS — S42201A Unspecified fracture of upper end of right humerus, initial encounter for closed fracture: Secondary | ICD-10-CM | POA: Diagnosis not present

## 2017-10-09 DIAGNOSIS — M659 Synovitis and tenosynovitis, unspecified: Secondary | ICD-10-CM | POA: Diagnosis not present

## 2017-10-09 DIAGNOSIS — Z885 Allergy status to narcotic agent status: Secondary | ICD-10-CM

## 2017-10-09 DIAGNOSIS — W19XXXA Unspecified fall, initial encounter: Secondary | ICD-10-CM | POA: Diagnosis present

## 2017-10-09 DIAGNOSIS — Z7902 Long term (current) use of antithrombotics/antiplatelets: Secondary | ICD-10-CM

## 2017-10-09 DIAGNOSIS — Z09 Encounter for follow-up examination after completed treatment for conditions other than malignant neoplasm: Secondary | ICD-10-CM

## 2017-10-09 DIAGNOSIS — I480 Paroxysmal atrial fibrillation: Secondary | ICD-10-CM | POA: Diagnosis not present

## 2017-10-09 DIAGNOSIS — Z780 Asymptomatic menopausal state: Secondary | ICD-10-CM

## 2017-10-09 DIAGNOSIS — I110 Hypertensive heart disease with heart failure: Secondary | ICD-10-CM | POA: Diagnosis not present

## 2017-10-09 DIAGNOSIS — Z880 Allergy status to penicillin: Secondary | ICD-10-CM | POA: Diagnosis not present

## 2017-10-09 DIAGNOSIS — D649 Anemia, unspecified: Secondary | ICD-10-CM | POA: Diagnosis not present

## 2017-10-09 DIAGNOSIS — Z79899 Other long term (current) drug therapy: Secondary | ICD-10-CM

## 2017-10-09 DIAGNOSIS — G8929 Other chronic pain: Secondary | ICD-10-CM | POA: Diagnosis present

## 2017-10-09 DIAGNOSIS — I5032 Chronic diastolic (congestive) heart failure: Secondary | ICD-10-CM | POA: Diagnosis present

## 2017-10-09 DIAGNOSIS — Z96641 Presence of right artificial hip joint: Secondary | ICD-10-CM | POA: Diagnosis not present

## 2017-10-09 DIAGNOSIS — H409 Unspecified glaucoma: Secondary | ICD-10-CM | POA: Diagnosis not present

## 2017-10-09 DIAGNOSIS — Z8262 Family history of osteoporosis: Secondary | ICD-10-CM | POA: Diagnosis not present

## 2017-10-09 DIAGNOSIS — Z886 Allergy status to analgesic agent status: Secondary | ICD-10-CM | POA: Diagnosis not present

## 2017-10-09 DIAGNOSIS — K5909 Other constipation: Secondary | ICD-10-CM | POA: Diagnosis present

## 2017-10-09 DIAGNOSIS — S42291A Other displaced fracture of upper end of right humerus, initial encounter for closed fracture: Secondary | ICD-10-CM | POA: Diagnosis present

## 2017-10-09 DIAGNOSIS — Z818 Family history of other mental and behavioral disorders: Secondary | ICD-10-CM | POA: Diagnosis not present

## 2017-10-09 DIAGNOSIS — Z9889 Other specified postprocedural states: Secondary | ICD-10-CM

## 2017-10-09 DIAGNOSIS — Z79890 Hormone replacement therapy (postmenopausal): Secondary | ICD-10-CM

## 2017-10-09 DIAGNOSIS — Z8673 Personal history of transient ischemic attack (TIA), and cerebral infarction without residual deficits: Secondary | ICD-10-CM

## 2017-10-09 DIAGNOSIS — Z419 Encounter for procedure for purposes other than remedying health state, unspecified: Secondary | ICD-10-CM

## 2017-10-09 HISTORY — DX: Syncope and collapse: R55

## 2017-10-09 HISTORY — DX: Cardiac arrhythmia, unspecified: I49.9

## 2017-10-09 HISTORY — PX: REVERSE SHOULDER ARTHROPLASTY: SHX5054

## 2017-10-09 HISTORY — DX: Other constipation: K59.09

## 2017-10-09 HISTORY — DX: Transient cerebral ischemic attack, unspecified: G45.9

## 2017-10-09 LAB — CBC
HEMATOCRIT: 30.9 % — AB (ref 36.0–46.0)
Hemoglobin: 9.8 g/dL — ABNORMAL LOW (ref 12.0–15.0)
MCH: 30.2 pg (ref 26.0–34.0)
MCHC: 31.7 g/dL (ref 30.0–36.0)
MCV: 95.4 fL (ref 78.0–100.0)
PLATELETS: 342 10*3/uL (ref 150–400)
RBC: 3.24 MIL/uL — AB (ref 3.87–5.11)
RDW: 15.9 % — ABNORMAL HIGH (ref 11.5–15.5)
WBC: 5.7 10*3/uL (ref 4.0–10.5)

## 2017-10-09 SURGERY — ARTHROPLASTY, SHOULDER, TOTAL, REVERSE
Anesthesia: General | Site: Shoulder | Laterality: Right

## 2017-10-09 MED ORDER — ROCURONIUM BROMIDE 50 MG/5ML IV SOSY
PREFILLED_SYRINGE | INTRAVENOUS | Status: AC
  Filled 2017-10-09: qty 5

## 2017-10-09 MED ORDER — ONDANSETRON HCL 4 MG/2ML IJ SOLN
INTRAMUSCULAR | Status: DC | PRN
  Administered 2017-10-09: 4 mg via INTRAVENOUS

## 2017-10-09 MED ORDER — DEXAMETHASONE SODIUM PHOSPHATE 10 MG/ML IJ SOLN
INTRAMUSCULAR | Status: DC | PRN
  Administered 2017-10-09: 5 mg via INTRAVENOUS

## 2017-10-09 MED ORDER — FENTANYL CITRATE (PF) 100 MCG/2ML IJ SOLN: 25.0000 ug | INTRAMUSCULAR | Status: DC | PRN

## 2017-10-09 MED ORDER — ZOLPIDEM TARTRATE 5 MG PO TABS: 5.0000 mg | ORAL_TABLET | Freq: Every evening | ORAL | Status: DC | PRN

## 2017-10-09 MED ORDER — DEXAMETHASONE SODIUM PHOSPHATE 10 MG/ML IJ SOLN
INTRAMUSCULAR | Status: AC
  Filled 2017-10-09: qty 1

## 2017-10-09 MED ORDER — ROPIVACAINE HCL 5 MG/ML IJ SOLN
INTRAMUSCULAR | Status: DC | PRN
  Administered 2017-10-09: 30 mL via PERINEURAL

## 2017-10-09 MED ORDER — ROCURONIUM BROMIDE 10 MG/ML (PF) SYRINGE
PREFILLED_SYRINGE | INTRAVENOUS | Status: DC | PRN
  Administered 2017-10-09: 50 mg via INTRAVENOUS
  Administered 2017-10-09: 20 mg via INTRAVENOUS

## 2017-10-09 MED ORDER — PHENYLEPHRINE 40 MCG/ML (10ML) SYRINGE FOR IV PUSH (FOR BLOOD PRESSURE SUPPORT)
PREFILLED_SYRINGE | INTRAVENOUS | Status: AC
  Filled 2017-10-09: qty 10

## 2017-10-09 MED ORDER — CLINDAMYCIN PHOSPHATE 900 MG/50ML IV SOLN
INTRAVENOUS | Status: AC
  Filled 2017-10-09: qty 50

## 2017-10-09 MED ORDER — SUCCINYLCHOLINE CHLORIDE 200 MG/10ML IV SOSY
PREFILLED_SYRINGE | INTRAVENOUS | Status: AC
  Filled 2017-10-09: qty 10

## 2017-10-09 MED ORDER — SUGAMMADEX SODIUM 200 MG/2ML IV SOLN
INTRAVENOUS | Status: DC | PRN
  Administered 2017-10-09: 200 mg via INTRAVENOUS

## 2017-10-09 MED ORDER — 0.9 % SODIUM CHLORIDE (POUR BTL) OPTIME
TOPICAL | Status: DC | PRN
  Administered 2017-10-09: 1000 mL

## 2017-10-09 MED ORDER — METOCLOPRAMIDE HCL 5 MG PO TABS: 5.0000 mg | ORAL_TABLET | Freq: Three times a day (TID) | ORAL | Status: DC | PRN

## 2017-10-09 MED ORDER — LEVOTHYROXINE SODIUM 88 MCG PO TABS
88.0000 ug | ORAL_TABLET | Freq: Every day | ORAL | Status: DC
  Administered 2017-10-10: 88 ug via ORAL
  Filled 2017-10-09: qty 1

## 2017-10-09 MED ORDER — LIDOCAINE 2% (20 MG/ML) 5 ML SYRINGE
INTRAMUSCULAR | Status: DC | PRN
  Administered 2017-10-09 (×2): 20 mg via INTRAVENOUS

## 2017-10-09 MED ORDER — PHENYLEPHRINE HCL 10 MG/ML IJ SOLN
INTRAMUSCULAR | Status: DC | PRN
  Administered 2017-10-09: 120 ug via INTRAVENOUS
  Administered 2017-10-09: 80 ug via INTRAVENOUS

## 2017-10-09 MED ORDER — CHLORHEXIDINE GLUCONATE 4 % EX LIQD: 60.0000 mL | Freq: Once | CUTANEOUS | Status: DC

## 2017-10-09 MED ORDER — ACETAMINOPHEN 500 MG PO TABS
1000.0000 mg | ORAL_TABLET | Freq: Once | ORAL | Status: AC
  Administered 2017-10-09: 1000 mg via ORAL

## 2017-10-09 MED ORDER — DIPHENHYDRAMINE HCL 12.5 MG/5ML PO ELIX: 12.5000 mg | ORAL_SOLUTION | ORAL | Status: DC | PRN

## 2017-10-09 MED ORDER — ONDANSETRON HCL 4 MG PO TABS: 4.0000 mg | ORAL_TABLET | Freq: Four times a day (QID) | ORAL | Status: DC | PRN

## 2017-10-09 MED ORDER — CELECOXIB 200 MG PO CAPS
200.0000 mg | ORAL_CAPSULE | Freq: Two times a day (BID) | ORAL | Status: DC
  Administered 2017-10-09 – 2017-10-10 (×2): 200 mg via ORAL
  Filled 2017-10-09 (×3): qty 1

## 2017-10-09 MED ORDER — DOCUSATE SODIUM 100 MG PO CAPS
100.0000 mg | ORAL_CAPSULE | Freq: Two times a day (BID) | ORAL | Status: DC
  Administered 2017-10-10: 100 mg via ORAL
  Filled 2017-10-09 (×2): qty 1

## 2017-10-09 MED ORDER — ACETAMINOPHEN 500 MG PO TABS
1000.0000 mg | ORAL_TABLET | Freq: Three times a day (TID) | ORAL | Status: DC
  Administered 2017-10-09 – 2017-10-10 (×3): 1000 mg via ORAL
  Filled 2017-10-09 (×3): qty 2

## 2017-10-09 MED ORDER — FENTANYL CITRATE (PF) 250 MCG/5ML IJ SOLN
INTRAMUSCULAR | Status: DC | PRN
  Administered 2017-10-09: 50 ug via INTRAVENOUS

## 2017-10-09 MED ORDER — FENTANYL CITRATE (PF) 100 MCG/2ML IJ SOLN
50.0000 ug | Freq: Once | INTRAMUSCULAR | Status: AC
  Administered 2017-10-09: 50 ug via INTRAVENOUS

## 2017-10-09 MED ORDER — ONDANSETRON HCL 4 MG/2ML IJ SOLN: 4.0000 mg | Freq: Once | INTRAMUSCULAR | Status: DC | PRN

## 2017-10-09 MED ORDER — TRANEXAMIC ACID 1000 MG/10ML IV SOLN
1000.0000 mg | INTRAVENOUS | Status: AC
  Administered 2017-10-09: 1000 mg via INTRAVENOUS
  Filled 2017-10-09: qty 1000

## 2017-10-09 MED ORDER — METOCLOPRAMIDE HCL 5 MG/ML IJ SOLN: 5.0000 mg | Freq: Three times a day (TID) | INTRAMUSCULAR | Status: DC | PRN

## 2017-10-09 MED ORDER — PROPOFOL 10 MG/ML IV BOLUS
INTRAVENOUS | Status: DC | PRN
  Administered 2017-10-09: 80 mg via INTRAVENOUS
  Administered 2017-10-09: 30 mg via INTRAVENOUS

## 2017-10-09 MED ORDER — FENTANYL CITRATE (PF) 250 MCG/5ML IJ SOLN
INTRAMUSCULAR | Status: AC
  Filled 2017-10-09: qty 5

## 2017-10-09 MED ORDER — HYDROMORPHONE HCL 1 MG/ML IJ SOLN
0.5000 mg | INTRAMUSCULAR | Status: DC | PRN
  Administered 2017-10-10: 0.5 mg via INTRAVENOUS
  Filled 2017-10-09: qty 1

## 2017-10-09 MED ORDER — ONDANSETRON HCL 4 MG/2ML IJ SOLN
INTRAMUSCULAR | Status: AC
  Filled 2017-10-09: qty 2

## 2017-10-09 MED ORDER — OXYCODONE HCL 5 MG PO TABS
5.0000 mg | ORAL_TABLET | ORAL | Status: DC | PRN
  Administered 2017-10-10 (×2): 5 mg via ORAL
  Filled 2017-10-09 (×2): qty 1

## 2017-10-09 MED ORDER — LORATADINE 10 MG PO TABS
10.0000 mg | ORAL_TABLET | Freq: Every day | ORAL | Status: DC
  Administered 2017-10-10: 10 mg via ORAL
  Filled 2017-10-09: qty 1

## 2017-10-09 MED ORDER — ACETAMINOPHEN 500 MG PO TABS
ORAL_TABLET | ORAL | Status: AC
  Administered 2017-10-09: 1000 mg via ORAL
  Filled 2017-10-09: qty 2

## 2017-10-09 MED ORDER — VANCOMYCIN HCL 1000 MG IV SOLR
INTRAVENOUS | Status: AC
  Filled 2017-10-09: qty 1000

## 2017-10-09 MED ORDER — CLINDAMYCIN PHOSPHATE 600 MG/50ML IV SOLN
600.0000 mg | Freq: Four times a day (QID) | INTRAVENOUS | Status: AC
  Administered 2017-10-09 – 2017-10-10 (×3): 600 mg via INTRAVENOUS
  Filled 2017-10-09 (×3): qty 50

## 2017-10-09 MED ORDER — CLINDAMYCIN PHOSPHATE 900 MG/50ML IV SOLN
900.0000 mg | INTRAVENOUS | Status: AC
  Administered 2017-10-09: 900 mg via INTRAVENOUS

## 2017-10-09 MED ORDER — ONDANSETRON HCL 4 MG/2ML IJ SOLN: 4.0000 mg | Freq: Four times a day (QID) | INTRAMUSCULAR | Status: DC | PRN

## 2017-10-09 MED ORDER — ACETAMINOPHEN 10 MG/ML IV SOLN: 1000.0000 mg | Freq: Once | INTRAVENOUS | Status: DC | PRN

## 2017-10-09 MED ORDER — LIDOCAINE 2% (20 MG/ML) 5 ML SYRINGE
INTRAMUSCULAR | Status: AC
  Filled 2017-10-09: qty 5

## 2017-10-09 MED ORDER — MIDAZOLAM HCL 2 MG/2ML IJ SOLN
INTRAMUSCULAR | Status: AC
  Filled 2017-10-09: qty 2

## 2017-10-09 MED ORDER — SODIUM CHLORIDE 0.9 % IV SOLN
INTRAVENOUS | Status: DC | PRN
  Administered 2017-10-09: 50 ug/min via INTRAVENOUS

## 2017-10-09 MED ORDER — CLONIDINE HCL (ANALGESIA) 100 MCG/ML EP SOLN
EPIDURAL | Status: DC | PRN
  Administered 2017-10-09: 50 ug

## 2017-10-09 MED ORDER — PROPOFOL 10 MG/ML IV BOLUS
INTRAVENOUS | Status: AC
  Filled 2017-10-09: qty 20

## 2017-10-09 MED ORDER — FENTANYL CITRATE (PF) 100 MCG/2ML IJ SOLN
INTRAMUSCULAR | Status: AC
  Administered 2017-10-09: 50 ug via INTRAVENOUS
  Filled 2017-10-09: qty 2

## 2017-10-09 MED ORDER — LACTATED RINGERS IV SOLN
INTRAVENOUS | Status: DC
  Administered 2017-10-09: 09:00:00 via INTRAVENOUS

## 2017-10-09 MED ORDER — SODIUM CHLORIDE 0.9 % IR SOLN
Status: DC | PRN
  Administered 2017-10-09: 3000 mL

## 2017-10-09 MED ORDER — ENOXAPARIN SODIUM 40 MG/0.4ML ~~LOC~~ SOLN
40.0000 mg | SUBCUTANEOUS | Status: DC
  Administered 2017-10-10: 40 mg via SUBCUTANEOUS
  Filled 2017-10-09: qty 0.4

## 2017-10-09 MED ORDER — ATORVASTATIN CALCIUM 20 MG PO TABS
20.0000 mg | ORAL_TABLET | Freq: Every day | ORAL | Status: DC
  Administered 2017-10-09 – 2017-10-10 (×2): 20 mg via ORAL
  Filled 2017-10-09 (×2): qty 1

## 2017-10-09 SURGICAL SUPPLY — 59 items
BASEPLATE GLENOSPHERE 25 STD (Miscellaneous) ×2 IMPLANT
BIT DRILL 3.2 PERIPHERAL SCREW (BIT) ×2 IMPLANT
BLADE SAW SAG 73X25 THK (BLADE)
BLADE SAW SGTL 73X25 THK (BLADE) IMPLANT
CHLORAPREP W/TINT 26ML (MISCELLANEOUS) ×4 IMPLANT
COVER SURGICAL LIGHT HANDLE (MISCELLANEOUS) ×2 IMPLANT
DRAPE C-ARM 42X72 X-RAY (DRAPES) ×2 IMPLANT
DRAPE HALF SHEET 40X57 (DRAPES) ×2 IMPLANT
DRAPE INCISE IOBAN 66X45 STRL (DRAPES) ×4 IMPLANT
DRAPE ORTHO SPLIT 77X108 STRL (DRAPES) ×2
DRAPE SURG ORHT 6 SPLT 77X108 (DRAPES) ×2 IMPLANT
DRAPE SWITCH (DRAPES) ×2 IMPLANT
DRAPE U-SHAPE 47X51 STRL (DRAPES) IMPLANT
DRSG AQUACEL AG ADV 3.5X 6 (GAUZE/BANDAGES/DRESSINGS) ×2 IMPLANT
ELECT REM PT RETURN 9FT ADLT (ELECTROSURGICAL) ×2
ELECTRODE REM PT RTRN 9FT ADLT (ELECTROSURGICAL) ×1 IMPLANT
GLENOSPHERE REV SHOULDER 36 (Joint) ×2 IMPLANT
GLOVE BIOGEL PI IND STRL 8 (GLOVE) ×1 IMPLANT
GLOVE BIOGEL PI INDICATOR 8 (GLOVE) ×1
GLOVE ECLIPSE 8.0 STRL XLNG CF (GLOVE) ×4 IMPLANT
GOWN STRL REUS W/ TWL LRG LVL3 (GOWN DISPOSABLE) ×1 IMPLANT
GOWN STRL REUS W/ TWL XL LVL3 (GOWN DISPOSABLE) ×1 IMPLANT
GOWN STRL REUS W/TWL LRG LVL3 (GOWN DISPOSABLE) ×1
GOWN STRL REUS W/TWL XL LVL3 (GOWN DISPOSABLE) ×1
GUIDEWIRE GLENOID 2.5X220 (WIRE) ×2 IMPLANT
HANDPIECE INTERPULSE COAX TIP (DISPOSABLE) ×1
IMPL REVERSE SHOULDER 0X3.5 (Shoulder) ×1 IMPLANT
IMPLANT REVERSE SHOULDER 0X3.5 (Shoulder) ×2 IMPLANT
INSERT HUMERAL 36X6MM 12.5DEG (Insert) ×2 IMPLANT
KIT BASIN OR (CUSTOM PROCEDURE TRAY) ×2 IMPLANT
KIT STABILIZATION SHOULDER (MISCELLANEOUS) ×2 IMPLANT
KIT TURNOVER KIT B (KITS) ×2 IMPLANT
MANIFOLD NEPTUNE II (INSTRUMENTS) ×2 IMPLANT
NEEDLE HYPO 25GX1X1/2 BEV (NEEDLE) IMPLANT
NEEDLE MAYO TROCAR (NEEDLE) IMPLANT
NS IRRIG 1000ML POUR BTL (IV SOLUTION) ×2 IMPLANT
PACK SHOULDER (CUSTOM PROCEDURE TRAY) ×2 IMPLANT
PAD ARMBOARD 7.5X6 YLW CONV (MISCELLANEOUS) ×4 IMPLANT
RESTRAINT HEAD UNIVERSAL NS (MISCELLANEOUS) ×2 IMPLANT
SCREW 5.0X38 SMALL F/PERFORM (Screw) ×2 IMPLANT
SCREW BONE 6.5 OD 30 NON BIO (Screw) ×2 IMPLANT
SCREW PERIPHERAL 30 (Screw) ×2 IMPLANT
SET HNDPC FAN SPRY TIP SCT (DISPOSABLE) ×1 IMPLANT
SPONGE LAP 18X18 X RAY DECT (DISPOSABLE) ×2 IMPLANT
STEM HUMERAL 3B LONG 98 (Stem) ×1 IMPLANT
STEM HUMERAL SZ 3B LONG 98MM (Stem) ×1 IMPLANT
STRIP CLOSURE SKIN 1/2X4 (GAUZE/BANDAGES/DRESSINGS) ×2 IMPLANT
SUCTION FRAZIER HANDLE 10FR (MISCELLANEOUS)
SUCTION TUBE FRAZIER 10FR DISP (MISCELLANEOUS) IMPLANT
SUT ETHIBOND 2 V 37 (SUTURE) ×2 IMPLANT
SUT ETHIBOND NAB CT1 #1 30IN (SUTURE) ×2 IMPLANT
SUT FIBERWIRE #5 38 CONV NDL (SUTURE) ×8
SUT MNCRL AB 3-0 PS2 18 (SUTURE) ×2 IMPLANT
SUT VIC AB 2-0 CT1 27 (SUTURE) ×1
SUT VIC AB 2-0 CT1 TAPERPNT 27 (SUTURE) ×1 IMPLANT
SUTURE FIBERWR #5 38 CONV NDL (SUTURE) ×4 IMPLANT
TOWEL OR 17X26 10 PK STRL BLUE (TOWEL DISPOSABLE) ×2 IMPLANT
TRAY FOLEY W/BAG SLVR 14FR (SET/KITS/TRAYS/PACK) IMPLANT
WATER STERILE IRR 1000ML POUR (IV SOLUTION) ×2 IMPLANT

## 2017-10-09 NOTE — Anesthesia Procedure Notes (Signed)
Procedure Name: Intubation Date/Time: 10/09/2017 8:32 AM Performed by: White, Amedeo Plenty, CRNA Pre-anesthesia Checklist: Patient identified, Emergency Drugs available, Suction available and Patient being monitored Patient Re-evaluated:Patient Re-evaluated prior to induction Oxygen Delivery Method: Circle System Utilized Preoxygenation: Pre-oxygenation with 100% oxygen Induction Type: IV induction Ventilation: Mask ventilation without difficulty Laryngoscope Size: Mac and 3 Grade View: Grade I Tube type: Oral Tube size: 7.0 mm Number of attempts: 1 Airway Equipment and Method: Stylet and Oral airway Placement Confirmation: ETT inserted through vocal cords under direct vision,  positive ETCO2 and breath sounds checked- equal and bilateral Secured at: 21 cm Tube secured with: Tape Dental Injury: Teeth and Oropharynx as per pre-operative assessment

## 2017-10-09 NOTE — Transfer of Care (Signed)
Immediate Anesthesia Transfer of Care Note  Patient: Stephanie Donovan  Procedure(s) Performed: REVERSE SHOULDER ARTHROPLASTY (Right Shoulder)  Patient Location: PACU  Anesthesia Type:GA combined with regional for post-op pain  Level of Consciousness: awake, alert  and patient cooperative  Airway & Oxygen Therapy: Patient Spontanous Breathing  Post-op Assessment: Report given to RN and Post -op Vital signs reviewed and stable  Post vital signs: Reviewed and stable  Last Vitals:  Vitals Value Taken Time  BP 106/63 10/09/2017  1:41 PM  Temp    Pulse 92 10/09/2017  1:42 PM  Resp 16 10/09/2017  1:42 PM  SpO2 95 % 10/09/2017  1:42 PM  Vitals shown include unvalidated device data.  Last Pain:  Vitals:   10/09/17 0918  TempSrc:   PainSc: 0-No pain      Patients Stated Pain Goal: 0 (03/88/82 8003)  Complications: No apparent anesthesia complications

## 2017-10-09 NOTE — H&P (Signed)
PREOPERATIVE H&P  Chief Complaint: right proximal humerus fracture  HPI: Stephanie Donovan is a 84 y.o. female who presents for preoperative history and physical with a diagnosis of right proximal humerus fracture. Symptoms are rated as moderate to severe, and have been worsening.  This is significantly impairing activities of daily living.  Please see my clinic note for full details on this patient's care.  She has elected for surgical management.   Past Medical History:  Diagnosis Date  . Blood transfusion without reported diagnosis   . Chronic back pain   . Chronic constipation   . Dysrhythmia    Afib with RVR - 2015, Oaroxyomal Afib  . GI bleed   . Glaucoma   . Syncope 2015  . Thyroid disease   . TIA (transient ischemic attack) 2015   Past Surgical History:  Procedure Laterality Date  . COLONOSCOPY    . HIP ARTHROPLASTY Right 09/22/2017   Procedure: ARTHROPLASTY BIPOLAR HIP (HEMIARTHROPLASTY);  Surgeon: [name], MD;  Location: [place];  Service: Orthopedics;  Laterality: Right;  . JOINT REPLACEMENT  11/20/2011   LTKR   Social History   Socioeconomic History  . Marital status: Widowed    Spouse name: Not on file  . Number of children: Not on file  . Years of education: Not on file  . Highest education level: Not on file  Occupational History  . Not on file  Social Needs  . Financial resource strain: Not on file  . Food insecurity:    Worry: Not on file    Inability: Not on file  . Transportation needs:    Medical: Not on file    Non-medical: Not on file  Tobacco Use  . Smoking status: Never Smoker  . Smokeless tobacco: Never Used  Substance and Sexual Activity  . Alcohol use: No  . Drug use: No  . Sexual activity: Never    Birth control/protection: Post-menopausal  Lifestyle  . Physical activity:    Days per week: Not on file    Minutes per session: Not on file  . Stress: Not on file  Relationships  . Social connections:    Talks on phone: Not on  file    Gets together: Not on file    Attends religious service: Not on file    Active member of club or organization: Not on file    Attends meetings of clubs or organizations: Not on file    Relationship status: Not on file  Other Topics Concern  . Not on file  Social History Narrative  . Not on file   Family History  Problem Relation Age of Onset  . Osteoporosis Mother   . Cancer - Colon Father   . Schizophrenia Son    Allergies  Allergen Reactions  . Aspirin Palpitations and Other (See Comments)    Mixed reactions  . Epinephrine Other (See Comments)    Rapped heart beat  . Penicillins Other (See Comments)    Lost hearing temporarily  . Codeine Palpitations   Prior to Admission medications   Medication Sig Start Date End Date Taking? Authorizing Provider  atorvastatin (LIPITOR) 20 MG tablet Take 1 tablet (20 mg total) by mouth daily. 08/26/14  Yes Delfina Redwood, MD  cetirizine (ZYRTEC) 10 MG tablet Take 10 mg by mouth daily. 08/17/17  Yes [provider]  docusate sodium (COLACE) 100 MG capsule Take 100 mg by mouth 2 (two) times daily.   Yes [provider]  enoxaparin (  LOVENOX) 40 MG/0.4ML injection Inject 0.4 mLs (40 mg total) into the skin daily. 09/24/17 09/24/18 Yes [name], MD  latanoprost (XALATAN) 0.005 % ophthalmic solution Place 1 drop into both eyes at bedtime.   Yes [provider]  levothyroxine (SYNTHROID, LEVOTHROID) 88 MCG tablet Take 88 mcg by mouth daily. 08/27/14  Yes [provider]  Lidocaine (ASPERCREME LIDOCAINE) 4 % PTCH Apply 1 patch topically See admin instructions. Apply to right shoulder in the morning   Yes [provider]  polyethylene glycol (MIRALAX / GLYCOLAX) packet Take 17 g by mouth daily as needed for mild constipation. Patient taking differently: Take 17 g by mouth See admin instructions. Give 17 g mixed in 5-8 oz of water and drink daily. Give 17 g mixed in 5-8 oz of water and drink daily as  needed for constipation. 09/24/17  Yes Shelly Coss, MD  timolol (TIMOPTIC) 0.5 % ophthalmic solution Place 1 drop into both eyes 2 (two) times daily.   Yes [provider]  clopidogrel (PLAVIX) 75 MG tablet Take 1 tablet (75 mg total) by mouth daily. Patient not taking: Reported on 09/21/2014 08/26/14   Delfina Redwood, MD  oxyCODONE (OXY IR/ROXICODONE) 5 MG immediate release tablet Take 1 pills every 4-6 hrs as needed for pain Patient not taking: Reported on 10/07/2017 09/24/17   [name], MD     Positive ROS: All other systems have been reviewed and were otherwise negative with the exception of those mentioned in the HPI and as above.  Physical Exam: General: Alert, no acute distress Cardiovascular: No pedal edema Respiratory: No cyanosis, no use of accessory musculature GI: No organomegaly, abdomen is soft and non-tender Skin: No lesions in the area of chief complaint Neurologic: Sensation intact distally Psychiatric: Patient is competent for consent with normal mood and affect Lymphatic: No axillary or cervical lymphadenopathy  MUSCULOSKELETAL: R shoulder axillary nerve firing, NVID, pain with ROM  Assessment: right proximal humerus fracture  Plan: Plan for Procedure(s): REVERSE SHOULDER ARTHROPLASTY  The risks benefits and alternatives were discussed with the patient including but not limited to the risks of nonoperative treatment, versus surgical intervention including infection, bleeding, nerve injury,  blood clots, cardiopulmonary complications, morbidity, mortality, among others, and they were willing to proceed.   [name], MD  10/09/2017 11:14 AM

## 2017-10-09 NOTE — Op Note (Signed)
Orthopaedic Surgery Operative Note (CSN: [id number])  Stephanie Donovan  Dec 14, 1927 Date of Surgery: 10/09/2017   Diagnoses:  right proximal humerus fracture with displacement and poor bone stock  Procedure: Reverse total shoulder arthroplasty for fracture   Operative Finding Successful completion of planned procedure.  Small perforation of vault posteriorly but good central screw fixation in addition to two good locking screws.  Able to press fit a long ascend stem.  Good repair of tuberosities including subscapularis.  Post-operative plan: The patient will be nwb in sling x4 weeks.  The patient will be admitted overnight with dispo back to SNF likely tomorrow.  DVT prophylaxis baseline with lovenox in setting of previous hip surgery.  Pain control with PRN pain medication preferring oral medicines.  Follow up plan will be scheduled in approximately 7 days for incision check and XR  Post-Op Diagnosis: Same Surgeons:Primary: [name], MD Assistants:Brandon Lynnell Jude Location: [facility] OR ROOM 06 Anesthesia: General Antibiotics: Ancef 2g preop Tourniquet time: * No tourniquets in log * Estimated Blood Loss: 527 Complications: None Specimens: None Implants: Implant Name Type Inv. Item Serial No. Manufacturer Lot No. LRB No. Used Action  BASEPLATE GLENOSPHERE 78EU STD - [id number] Miscellaneous BASEPLATE GLENOSPHERE 15QM STD  [employer] [id number] Right 1 Implanted  GLENOSPHERE REV SHOULDER 36 - [id number] Joint GLENOSPHERE REV SHOULDER 36  [employer] [id number] Right 1 Implanted  SCREW BONE 6.5 OD 30 NON BIO - [id number] Screw SCREW BONE 6.5 OD 30 NON BIO  [employer]  Right 1 Implanted  SCREW PERIPHERAL 30 - [id number] Screw SCREW PERIPHERAL 30  [employer]  Right 1 Implanted  SCREW 5.0X38 SMALL F/PERFORM - [id number] Screw SCREW 5.0X38 SMALL F/PERFORM  [employer]  Right 1 Implanted  STEM HUMERAL SZ 3B LONG 98MM - [id number] Stem STEM HUMERAL SZ 3B LONG 98MM [id number] [employer]   Right 1 Implanted  IMPLANT REVERSE SHOULDER 0X3.5 - [id number] Shoulder IMPLANT REVERSE SHOULDER 0X3.5 [id number] [employer]  Right 1 Implanted  INSERT HUMERAL 36X6MM 12.5DEG - [id number] Insert INSERT HUMERAL 36X6MM 12.5DEG [id number] [employer]  Right 1 Implanted    Indications for Surgery:   REEGAN Donovan is a 84 y.o. female with fall about 2 weeks ago.  She underwent hip hemiarthroplasty due to femoral neck fracture but we attempted non-operative management of her shoulder.  Unfortunately in clinic she had displacement of her proximal humerus fracture and we talked with the patient and her daugther about surgery.  Due to the patient's hollowed out appearance of her head of the humerus we felt that ORIF would potentially have poor outcomes.  Instead we felt that a more predictable option for her would be a reverse more quickly allowing weight bearing. Benefits and risks of operative and nonoperative management were discussed prior to surgery with patient/guardian(s) and informed consent form was completed.  Specific risks including infection, need for additional surgery, axillary nerve injury, dislocation, periprosthetic fracture, loosening and need for revision.   Procedure:   The patient was identified in the preoperative holding area where the surgical site was marked. The patient was taken to the OR where a procedural timeout was called and the above noted anesthesia was induced.  The patient was positioned beachchair on allen bed.  Preoperative antibiotics were dosed.  The patient's right shoulder was prepped and draped in the usual sterile fashion.  A second preoperative timeout was called.      Standard deltopectoral approach was performed with a #10 blade. We dissected down to  the subcutaneous tissues and the cephalic vein was taken laterally with the deltoid. Clavipectoral fascia was incised in line with the incision. Deep retractors were placed. The long of the biceps tendon was  identified and there was significant tenosynovitis present.  Tenodesis was performed to the pectoralis tendon with #2 Ethibond. The remaining biceps was followed up into the rotator interval where it was released.   We used the bicipital groove as a landmark for the lesser and greater tuberosity fragments.  We were able to mobilize the lesser tuberosity fragment and placed stay sutures in the bone tendon junction to help with mobilization.  This point we were able to identify the  greater tuberosity fragment and 4 #5  FiberWire sutures were used to place into this for eventual repair of the tuberosities.  Once these were both mobilized we took care to identify the shaft fragment as well as the head fragment.  We carefully identified the head fragment were able to manually remove it.  At this point the axillary nerve was found and palpated and with a tug test noted to be intact.  Protected throughout the remainder of the case with blunt retractors.   We then released the SGHL with bovie cautery prior to placing a curved mayo at the junction of the anterior glenoid well above the axillary nerve and bluntly dissecting the subscapularis from the capsule.  We then carefully protected the axillary nerve as we gently released the inferior capsule to fully mobilize the subscapularis.  An anterior deltoid retractor was then placed as well as a small Hohmann retractor superiorly.  The glenoid was relatively preserved as we would expect in this fracture patient.  The remaining labrum was removed circumferentially taking great care not to disrupt the posterior capsule. We cleared native cartilage to allow ingrowth of the baseplate  The glenoid drill guide was placed and used to drill a guide pin in the center, inferior position. The glenoid face was then reamed concentrically over the guide wire. The center hole was drilled over the guidepin in a near anatomic angle of version. Next the glenoid vault was drilled  back to a depth of 30 mm.  There was a small amount of posterior vault perforation but the fixation was approriate with a tap even into the scapula and we felt that a typical central screw would be appropriate.  We tapped and then placed a 77mm size baseplate with 0 lateralization was selected with a 20mm x 6.29mm length central screw.  The base plate was screwed into the glenoid vault obtaining secure fixation. We next placed superior and inferior locking screws for additional fixation.  Next a 36 mm glenosphere was selected and impacted onto the baseplate. The center screw was tightened.  We then repositioned the arm to give access to the humeral shaft fragment.  Drill holes were placed and fiberwire sutures in the shaft for vertical fixation of the tuberosities.  We broached starting with a size one broach and broaching up to 3 which obtained an appropriate fit above the pec.  This implant has a solid fit and we felt comfortable with pressfit for this patient.  We trialed with multiple size tray and polyethylene options and selected a 0 high which provided good stability and range of motion without excess soft tissue tension when placed with offset minimized.  The shoulder was trialed.  There was good ROM in all planes and the shoulder was stable with no inferior translation.  We then mobilized her tuberosities  again and placed the anterior deep limbs of the 4 #5 fiber wires around the stem.  1 of these was tied down fixing the greater tuberosity in place after bone graft harvest from the humeral head component was placed underneath.  A +0 high offset tray was selected and impacted onto the stem.   A 36+6 polyethylene liner was impacted onto the stem.  The joint was reduced and thoroughly irrigated with pulsatile lavage. The remaining sutures were then placed through the subscapularis and the bone tendon junction and the tuberosities were reduced after bone graft placed beneath as autograft at the  subscap.  We horizontally secured the tuberosities before placing vertical fixation with the suture that was placed into the shaft.  Tuberosities moved as a unit were happy with her overall reduction.  This was checked on fluoroscopy confirming our position.  We irrigated copiously at this point.  Hemostasis was obtained. The deltopectoral interval was reapproximated with #1 Ethibond. The subcutaneous tissues were closed with 3-0 Vicryl and the skin was closed with running monocryl.    The wounds were cleaned and dried and an Aquacel dressing was placed. The drapes taken down. The arm was placed into sling with abduction pillow. Patient was awakened, extubated, and transferred to the recovery room in stable condition. There were no intraoperative complications. The sponge, needle, and attention counts were correct at the end of the case.    Joya Gaskins, OPA-C, present and scrubbed throughout the case, critical for completion in a timely fashion, and for retraction, instrumentation, closure.

## 2017-10-09 NOTE — Plan of Care (Signed)

## 2017-10-09 NOTE — Anesthesia Procedure Notes (Signed)
Anesthesia Regional Block: Interscalene brachial plexus block   Pre-Anesthetic Checklist: ,, timeout performed, Correct Patient, Correct Site, Correct Laterality, Correct Procedure, Correct Position, site marked, Risks and benefits discussed, at surgeon's request and post-op pain management  Laterality: Upper and Right  Prep: Betadine, chloraprep, alcohol swabs       Needles:  Injection technique: Single-shot  Needle Type: Stimulator Needle - 40      Needle Gauge: 22     Additional Needles:   Procedures:, nerve stimulator,,,,,,,  Narrative:  Start time: 10/09/2017 10:31 AM End time: 10/09/2017 10:44 AM  Performed by: Personally  Anesthesiologist: Barnet Glasgow, MD  Additional Notes: Block assessed prior to start of surgery

## 2017-10-09 NOTE — Progress Notes (Signed)
Orthopedic Tech Progress Note Patient Details:  Stephanie Donovan 1927-10-30 [id number]  Ortho Devices Type of Ortho Device: Shoulder abduction pillow Ortho Device/Splint Interventions: Ordered    as ordered by Dr. Ferdinand Lango, Stephanie Donovan 10/09/2017, 1:31 PM

## 2017-10-10 ENCOUNTER — Encounter (HOSPITAL_COMMUNITY): Payer: Self-pay | Admitting: Orthopaedic Surgery

## 2017-10-10 LAB — CBC
HCT: 27.4 % — ABNORMAL LOW (ref 36.0–46.0)
Hemoglobin: 8.7 g/dL — ABNORMAL LOW (ref 12.0–15.0)
MCH: 29.8 pg (ref 26.0–34.0)
MCHC: 31.8 g/dL (ref 30.0–36.0)
MCV: 93.8 fL (ref 78.0–100.0)
Platelets: 312 10*3/uL (ref 150–400)
RBC: 2.92 MIL/uL — ABNORMAL LOW (ref 3.87–5.11)
RDW: 15.9 % — AB (ref 11.5–15.5)
WBC: 10.3 10*3/uL (ref 4.0–10.5)

## 2017-10-10 MED ORDER — ONDANSETRON HCL 4 MG PO TABS: 4.0000 mg | ORAL_TABLET | Freq: Three times a day (TID) | ORAL | 1 refills | Status: AC | PRN

## 2017-10-10 MED ORDER — CELECOXIB 100 MG PO CAPS: 100.0000 mg | ORAL_CAPSULE | Freq: Every day | ORAL | 2 refills | Status: DC

## 2017-10-10 MED ORDER — ACETAMINOPHEN 500 MG PO TABS: 1000.0000 mg | ORAL_TABLET | Freq: Three times a day (TID) | ORAL | 0 refills | Status: AC

## 2017-10-10 MED ORDER — OXYCODONE HCL 5 MG PO TABS: ORAL_TABLET | ORAL | 0 refills | Status: DC

## 2017-10-10 NOTE — Progress Notes (Addendum)
Patient is form [facility] copy and is expected to return to [facility] once discharged. CSW contacted the facility and advised no FL2 was needed but the discharge summary needed the patient's medications included. CSW informed RN for follow up.

## 2017-10-10 NOTE — Anesthesia Postprocedure Evaluation (Signed)
Anesthesia Post Note  Patient: Stephanie Donovan  Procedure(s) Performed: REVERSE SHOULDER ARTHROPLASTY (Right Shoulder)     Patient location during evaluation: PACU Anesthesia Type: General Level of consciousness: awake and alert Pain management: pain level controlled Vital Signs Assessment: post-procedure vital signs reviewed and stable Respiratory status: spontaneous breathing, nonlabored ventilation, respiratory function stable and patient connected to nasal cannula oxygen Cardiovascular status: blood pressure returned to baseline and stable Postop Assessment: no apparent nausea or vomiting Anesthetic complications: no    Last Vitals:  Vitals:   10/09/17 2023 10/10/17 0500  BP: 105/70 105/74  Pulse: 90   Resp: 17   Temp: 36.6 C   SpO2: 99% 99%    Last Pain:  Vitals:   10/10/17 1009  TempSrc:   PainSc: 8                  Barnet Glasgow

## 2017-10-10 NOTE — Evaluation (Signed)
Occupational Therapy Evaluation and defer to next venue of service/Care Patient Details Name: Stephanie Donovan MRN: [id number] DOB: [birth year] Today's Date: 10/10/2017    History of Present Illness Pt is an 84 y/o female R proximal humerus fracture with displacement. Now s/p Closed 4-part fracture of proximal humerus, right. Pt has a PMH including Chronic back pain, Chronic constipation, Dysrhythmia, GI bleed, Glaucoma, Syncope (2015), Thyroid disease, and TIA (transient ischemic attack) (2015).   Clinical Impression   PTA pt at SNF since initial fall. Pt is currently max A for UB ADL. Educated Pt on sling management, showering, dressing, exercises as ordered by MD, and all Shoulder Handout reviewed in full - focus on compensatory strategies for ADL. Pt eager to return to independent and pleasant throughout session. OT to defer further sessions to SNF as she is set to discharge today. Thank you for the opportunity to serve this patient.    Follow Up Recommendations  SNF;Supervision/Assistance - 24 hour    Equipment Recommendations  Other (comment)(defer to next venue of care)    Recommendations for Other Services       Precautions / Restrictions Precautions Precautions: Fall;Shoulder;Posterior Hip Type of Shoulder Precautions: conservative Shoulder Interventions: Shoulder sling/immobilizer;At all times;For comfort Precaution Booklet Issued: Yes (comment)(OT shoulder handout) Precaution Comments: reviewed handout in full Required Braces or Orthoses: Sling Restrictions Weight Bearing Restrictions: Yes RUE Weight Bearing: Non weight bearing      Mobility Bed Mobility               General bed mobility comments: Pt OOB in recliner when OT entered  Transfers Overall transfer level: Needs assistance Equipment used: 1 person hand held assist Transfers: Sit to/from Stand Sit to Stand: Min assist         General transfer comment: min A for boost from recliner - no  attempt to use RUE for pushing up/support down    Balance Overall balance assessment: Needs assistance Sitting-balance support: Single extremity supported;Feet supported Sitting balance-Leahy Scale: Fair     Standing balance support: Single extremity supported;During functional activity Standing balance-Leahy Scale: Poor Standing balance comment: Relies on LUE support and external assistance for static and dynamic standing activities.                           ADL either performed or assessed with clinical judgement   ADL                                         General ADL Comments: please see shoulder section below     Vision Patient Visual Report: No change from baseline Vision Assessment?: No apparent visual deficits     Perception     Praxis      Pertinent Vitals/Pain Pain Assessment: Faces Faces Pain Scale: Hurts a little bit Pain Location: R shoulder during elbow ROM Pain Descriptors / Indicators: Discomfort;Grimacing;Operative site guarding;Sore Pain Intervention(s): Limited activity within patient's tolerance;Monitored during session;Repositioned;Ice applied     Hand Dominance Right   Extremity/Trunk Assessment Upper Extremity Assessment Upper Extremity Assessment: RUE deficits/detail RUE Deficits / Details: ROM deficits as anticipated post-op RUE Sensation: decreased light touch(at shoulder) RUE Coordination: decreased gross motor   Lower Extremity Assessment Lower Extremity Assessment: RLE deficits/detail RLE Deficits / Details: approx 2 weeks post-op psterior hip precautions RLE Sensation: decreased light touch   Cervical / Trunk Assessment  Cervical / Trunk Assessment: Normal   Communication     Cognition Arousal/Alertness: Awake/alert Behavior During Therapy: WFL for tasks assessed/performed Overall Cognitive Status: Within Functional Limits for tasks assessed                                      General Comments       Exercises Exercises: Shoulder Shoulder Exercises Elbow Flexion: PROM;Right;10 reps;Seated Elbow Extension: PROM;Right;10 reps;Seated Wrist Flexion: AROM;Right Wrist Extension: AROM;Right Digit Composite Flexion: AROM;Right Composite Extension: AROM;Right Neck Flexion: AROM Neck Extension: AROM Neck Lateral Flexion - Right: AROM Neck Lateral Flexion - Left: AROM   Shoulder Instructions Shoulder Instructions Donning/doffing shirt without moving shoulder: Maximal assistance Method for sponge bathing under operated UE: Maximal assistance;Patient able to independently direct caregiver Donning/doffing sling/immobilizer: Maximal assistance Correct positioning of sling/immobilizer: Maximal assistance(strap is VERY VERY long and poorly sized) ROM for elbow, wrist and digits of operated UE: Supervision/safety Sling wearing schedule (on at all times/off for ADL's): Modified independent Proper positioning of operated UE when showering: Min-guard Positioning of UE while sleeping: Modified independent    Home Living Family/patient expects to be discharged to:: Skilled nursing facility                                        Prior Functioning/Environment                   OT Problem List: Decreased strength;Decreased range of motion;Decreased activity tolerance;Impaired balance (sitting and/or standing);Decreased safety awareness;Decreased knowledge of use of DME or AE;Decreased knowledge of precautions;Pain      OT Treatment/Interventions: Self-care/ADL training;Therapeutic exercise;Energy conservation;DME and/or AE instruction;Therapeutic activities;Patient/family education;Balance training;Cognitive remediation/compensation    OT Goals(Current goals can be found in the care plan section) Acute Rehab OT Goals Patient Stated Goal: get back to independent OT Goal Formulation: With patient Time For Goal Achievement: 10/21/17 Potential to  Achieve Goals: Good  OT Frequency: Min 2X/week   Barriers to D/C:            Co-evaluation              AM-PAC PT "6 Clicks" Daily Activity     Outcome Measure Help from another person eating meals?: A Little Help from another person taking care of personal grooming?: A Little Help from another person toileting, which includes using toliet, bedpan, or urinal?: A Lot Help from another person bathing (including washing, rinsing, drying)?: A Lot Help from another person to put on and taking off regular upper body clothing?: A Lot Help from another person to put on and taking off regular lower body clothing?: A Lot 6 Click Score: 14   End of Session Equipment Utilized During Treatment: Other (comment);Gait belt(sling) Nurse Communication: Mobility status  Activity Tolerance: Patient tolerated treatment well Patient left: in chair;with call bell/phone within reach;with chair alarm set  OT Visit Diagnosis: Other abnormalities of gait and mobility (R26.89);Pain Pain - Right/Left: Right Pain - part of body: Shoulder                Time: 6599-3570 OT Time Calculation (min): 40 min Charges:  OT General Charges $OT Visit: 1 Visit OT Evaluation $OT Eval Moderate Complexity: 1 Mod OT Treatments $Self Care/Home Management : 8-22 mins $Therapeutic Exercise: 8-22 mins  Hulda Humphrey OTR/L Acute Rehabilitation Services Pager:  [phone] Office: [place] 10/10/2017, 11:22 AM

## 2017-10-10 NOTE — Clinical Social Work Placement (Signed)
   CLINICAL SOCIAL WORK PLACEMENT  NOTE [place]  Date:  10/10/2017  Patient Details  Name: Stephanie Donovan MRN: [id number] Date of Birth: January 30, 1927  Clinical Social Work is seeking post-discharge placement for this patient at the [place] level of care (*CSW will initial, date and re-position this form in  chart as items are completed):  Yes   Patient/family provided with [place] Work Department's list of facilities offering this level of care within the geographic area requested by the patient (or if unable, by the patient's family).  Yes   Patient/family informed of their freedom to choose among providers that offer the needed level of care, that participate in Medicare, Medicaid or managed care program needed by the patient, have an available bed and are willing to accept the patient.  Yes   Patient/family informed of [place]'s ownership interest in [facility] and [facility], as well as of the fact that they are under no obligation to receive care at these facilities.  PASRR submitted to EDS on       PASRR number received on       Existing PASRR number confirmed on       FL2 transmitted to all facilities in geographic area requested by pt/family on       FL2 transmitted to all facilities within larger geographic area on       Patient informed that his/her managed care company has contracts with or will negotiate with certain facilities, including the following:        Yes   Patient/family informed of bed offers received.  Patient chooses bed at [facility]     Physician recommends and patient chooses bed at      Patient to be transferred to [facility] on 10/10/17.  Patient to be transferred to facility by [facility]     Patient family notified on 10/10/17 of transfer.  Name of family member notified:  daughter [place]     PHYSICIAN       Additional Comment:     _______________________________________________ Alexander [place], [place] 10/10/2017, 3:05 PM

## 2017-10-10 NOTE — Discharge Summary (Addendum)
Patient ID: Stephanie Donovan MRN: [id number] DOB/AGE: 1927-02-11 84 y.o.  Admit date: 10/09/2017 Discharge date: 10/10/2017  Admission Diagnoses:R proximal humerus fracture with displacement  Discharge Diagnoses:  Active Problems:   Closed 4-part fracture of proximal humerus, right, initial encounter   Past Medical History:  Diagnosis Date  . Blood transfusion without reported diagnosis   . Chronic back pain   . Chronic constipation   . Dysrhythmia    Afib with RVR - 2015, Oaroxyomal Afib  . GI bleed   . Glaucoma   . Syncope 2015  . Thyroid disease   . TIA (transient ischemic attack) 2015     Procedures Performed: R reverse total shoulder arthroplasty  Discharged Condition: good  Hospital Course: Patient brought in as an outpatient for surgery.  Tolerated procedure well.  Was kept for monitoring overnight for pain control and medical monitoring postop and was found to be stable for DC back to SNF the morning after surgery.  Patient was instructed on specific activity restrictions and all questions were answered.   Consults: None  Significant Diagnostic Studies: No additional pertinent studies  Treatments: Surgery  Discharge Exam:  Dressing CDI and sling well fitting,  full and painless ROM throughout hand with DPC of 0. + Motor in  AIN, PIN, Ulnar distributions. Axillary nerve sensation preserved and symmetric.  Sensation intact in medial, radial, and ulnar distributions. Well perfused digits.    Disposition: Discharge disposition: 03-Skilled Nursing Facility       Discharge Instructions    Call MD for:  persistant nausea and vomiting   Complete by:  As directed    Call MD for:  redness, tenderness, or signs of infection (pain, swelling, redness, odor or green/yellow discharge around incision site)   Complete by:  As directed    Call MD for:  severe uncontrolled pain   Complete by:  As directed    Diet - low sodium heart healthy   Complete by:  As  directed    Discharge instructions   Complete by:  As directed    Ophelia Charter MD, MPH [place]. [address] [phone] (tel)   [phone] (fax)   [place] may leave the operative dressing in place until your follow-up appointment. KEEP THE INCISIONS CLEAN AND DRY. Use the Cryocuff, GameReady or Ice as often as possible for the first 3-4 days, then as needed for pain relief.  You may shower on Post-Op Day #2. The dressing is water resistant but do not scrub it as it may start to peel up.  You may remove the sling for showering, but keep a water resistant pillow under the arm to keep both the elbow and shoulder away from the body (mimicking the abduction sling). Gently pat the area dry. Do not soak the shoulder in water. Do not go swimming in the pool or ocean until your sutures are removed.  EXERCISES Wear the sling at all times except when doing your exercises. You may remove the sling for showering, but keep the arm across the chest or in a secondary sling.   Accidental/Purposeful External Rotation and shoulder flexion (reaching behind you) is to be avoided at all costs for the first month. Please perform the exercises:   Elbow / Hand / Wrist  Range of Motion Exercises POST-OP A multi-modal approach will be used to treat your pain. Oxycodone - This is a strong narcotic, to be  used only on an "as needed" basis for pain. Meloxicam- An anti-inflammatory medication Acetaminophen - A non-narcotic pain medicine.  Use 1000mg  three times a day for the first 14 days after surgery If you have any adverse effects with the medications, please call our office.  FOLLOW-UP If you develop a Fever (>101.5), Redness or Drainage from the surgical incision site, please call our office to arrange for an evaluation. Please call the office to schedule a follow-up appointment for a wound check, 7-10  days post-operatively.    IF YOU HAVE ANY QUESTIONS, PLEASE FEEL FREE TO CALL OUR OFFICE.   HELPFUL INFORMATION  Your arm will be in a sling following surgery. You will be in this sling for the next 3-4 weeks.  I will let you know the exact duration at your follow-up visit.  You may be more comfortable sleeping in a semi-seated position the first few nights following surgery.  Keep a pillow propped under the elbow and forearm for comfort.  If you have a recliner type of chair it might be beneficial.  If not that is fine too, but it would be helpful to sleep propped up with pillows behind your operated shoulder as well under your elbow and forearm.  This will reduce pulling on the suture lines.  We suggest you use the pain medication the first night prior to going to bed, in order to ease any pain when the anesthesia wears off. You should avoid taking pain medications on an empty stomach as it will make you nauseous.  Do not drink alcoholic beverages or take illicit drugs when taking pain medications.  In most states it is against the law to drive while your arm is in a sling. And certainly against the law to drive while taking narcotics.  You may return to work/school in the next couple of days when you feel up to it. Desk work and typing in the sling is fine.  When dressing, put your operative arm in the sleeve first.  When getting undressed, take your operative arm out last.  Loose fitting, button-down shirts are recommended.  Pain medication may make you constipated.  Below are a few solutions to try in this order: Decrease the amount of pain medication if you aren't having pain. Drink lots of decaffeinated fluids. Drink prune juice and/or each dried prunes  If the first 3 don't work start with additional solutions Take Colace - an over-the-counter stool softener Take Senokot - an over-the-counter laxative Take Miralax - a stronger over-the-counter laxative   Increase activity  slowly   Complete by:  As directed      Allergies as of 10/10/2017      Reactions   Aspirin Palpitations, Other (See Comments)   Mixed reactions   Epinephrine Other (See Comments)   Rapped heart beat   Penicillins Other (See Comments)   Lost hearing temporarily   Codeine Palpitations      Medication List    TAKE these medications   acetaminophen 500 MG tablet Commonly known as:  TYLENOL Take 2 tablets (1,000 mg total) by mouth every 8 (eight) hours for 14 days.   ASPERCREME LIDOCAINE 4 % Ptch Generic drug:  Lidocaine Apply 1 patch topically See admin instructions. Apply to right shoulder in the morning   atorvastatin 20 MG tablet Commonly known as:  LIPITOR Take 1 tablet (20 mg total) by mouth daily.   celecoxib 100 MG capsule Commonly known as:  CELEBREX Take 1 capsule (100 mg total) by  mouth daily.   cetirizine 10 MG tablet Commonly known as:  ZYRTEC Take 10 mg by mouth daily.   clopidogrel 75 MG tablet Commonly known as:  PLAVIX Take 1 tablet (75 mg total) by mouth daily.   docusate sodium 100 MG capsule Commonly known as:  COLACE Take 100 mg by mouth 2 (two) times daily.   enoxaparin 40 MG/0.4ML injection Commonly known as:  LOVENOX Inject 0.4 mLs (40 mg total) into the skin daily.   latanoprost 0.005 % ophthalmic solution Commonly known as:  XALATAN Place 1 drop into both eyes at bedtime.   levothyroxine 88 MCG tablet Commonly known as:  SYNTHROID, LEVOTHROID Take 88 mcg by mouth daily.   ondansetron 4 MG tablet Commonly known as:  ZOFRAN Take 1 tablet (4 mg total) by mouth every 8 (eight) hours as needed for up to 7 days for nausea or vomiting.   oxyCODONE 5 MG immediate release tablet Commonly known as:  Oxy IR/ROXICODONE Take 1 pills every 4-6 hrs as needed for pain   polyethylene glycol packet Commonly known as:  MIRALAX / GLYCOLAX Take 17 g by mouth daily as needed for mild constipation. What changed:    when to take this  additional  instructions   timolol 0.5 % ophthalmic solution Commonly known as:  TIMOPTIC Place 1 drop into both eyes 2 (two) times daily.

## 2017-10-10 NOTE — Social Work (Signed)
Clinical Social Worker facilitated patient discharge including contacting patient family and facility to confirm patient discharge plans.  Clinical information faxed to facility and family agreeable with plan.  CSW arranged ambulance transport via [facility] to [facility].   RN to call [phone] with report  prior to discharge.  Clinical Social Worker will sign off for now as social work intervention is no longer needed. Please consult [place] again if new need arises.  Alexander Mt, [place] Social Worker  [phone]

## 2017-10-14 ENCOUNTER — Encounter (HOSPITAL_COMMUNITY): Payer: Self-pay | Admitting: Internal Medicine

## 2017-10-14 ENCOUNTER — Emergency Department (HOSPITAL_COMMUNITY)
Admission: EM | Admit: 2017-10-14 | Discharge: 2017-10-14 | Disposition: A | Discharge status: Home / Self Care | Payer: Medicare Other | Attending: Emergency Medicine | Admitting: Emergency Medicine

## 2017-10-14 DIAGNOSIS — Z96652 Presence of left artificial knee joint: Secondary | ICD-10-CM | POA: Diagnosis not present

## 2017-10-14 DIAGNOSIS — R55 Syncope and collapse: Secondary | ICD-10-CM | POA: Diagnosis not present

## 2017-10-14 DIAGNOSIS — E039 Hypothyroidism, unspecified: Secondary | ICD-10-CM | POA: Insufficient documentation

## 2017-10-14 DIAGNOSIS — I5032 Chronic diastolic (congestive) heart failure: Secondary | ICD-10-CM | POA: Diagnosis not present

## 2017-10-14 DIAGNOSIS — Z96611 Presence of right artificial shoulder joint: Secondary | ICD-10-CM | POA: Insufficient documentation

## 2017-10-14 DIAGNOSIS — Z79899 Other long term (current) drug therapy: Secondary | ICD-10-CM | POA: Diagnosis not present

## 2017-10-14 DIAGNOSIS — Z96641 Presence of right artificial hip joint: Secondary | ICD-10-CM | POA: Diagnosis not present

## 2017-10-14 DIAGNOSIS — Z8673 Personal history of transient ischemic attack (TIA), and cerebral infarction without residual deficits: Secondary | ICD-10-CM | POA: Insufficient documentation

## 2017-10-14 LAB — BASIC METABOLIC PANEL
ANION GAP: 8 (ref 5–15)
BUN: 11 mg/dL (ref 8–23)
CHLORIDE: 95 mmol/L — AB (ref 98–111)
CO2: 28 mmol/L (ref 22–32)
Calcium: 8.2 mg/dL — ABNORMAL LOW (ref 8.9–10.3)
Creatinine, Ser: 0.61 mg/dL (ref 0.44–1.00)
GFR calc Af Amer: >60 mL/min (ref >60)
GFR calc non Af Amer: >60 mL/min (ref >60)
Glucose, Bld: 124 mg/dL — ABNORMAL HIGH (ref 70–99)
POTASSIUM: 4.1 mmol/L (ref 3.5–5.1)
Sodium: 131 mmol/L — ABNORMAL LOW (ref 135–145)

## 2017-10-14 LAB — CBC WITH DIFFERENTIAL/PLATELET
ABS IMMATURE GRANULOCYTES: 0 10*3/uL (ref 0.0–0.1)
Basophils Absolute: 0 10*3/uL (ref 0.0–0.1)
Basophils Relative: 1 %
Eosinophils Absolute: 0.2 10*3/uL (ref 0.0–0.7)
Eosinophils Relative: 2 %
HCT: 27.7 % — ABNORMAL LOW (ref 36.0–46.0)
HEMOGLOBIN: 8.6 g/dL — AB (ref 12.0–15.0)
IMMATURE GRANULOCYTES: 0 %
LYMPHS PCT: 9 %
Lymphs Abs: 0.6 10*3/uL — ABNORMAL LOW (ref 0.7–4.0)
MCH: 30 pg (ref 26.0–34.0)
MCHC: 31 g/dL (ref 30.0–36.0)
MCV: 96.5 fL (ref 78.0–100.0)
MONOS PCT: 11 %
Monocytes Absolute: 0.8 10*3/uL (ref 0.1–1.0)
NEUTROS ABS: 5.7 10*3/uL (ref 1.7–7.7)
Neutrophils Relative %: 77 %
Platelets: 313 10*3/uL (ref 150–400)
RBC: 2.87 MIL/uL — ABNORMAL LOW (ref 3.87–5.11)
RDW: 14.9 % (ref 11.5–15.5)
WBC: 7.4 10*3/uL (ref 4.0–10.5)

## 2017-10-14 LAB — I-STAT TROPONIN, ED: Troponin i, poc: 0 ng/mL (ref 0.00–0.08)

## 2017-10-14 NOTE — ED Notes (Signed)
PA spoke with pt and family, pt agrees and aware of plan to transport to [place] via [place]

## 2017-10-14 NOTE — ED Provider Notes (Signed)
[facility] EMERGENCY DEPARTMENT Provider Note   CSN: [id number] Arrival date & time: 10/14/17  1036     History   Chief Complaint Chief Complaint  Patient presents with  . Loss of Consciousness    HPI Stephanie Donovan is a 84 y.o. female BIB EMS from [place] after a syncopal episode that occurred while having PT.  Patient is in rehab for history of arm fracture.  She was performing PT today when she felt lightheaded and had a questionable syncopal episode.  Patient states she did not lose consciousness.  EMS reports they are unsure and states that they were called out for unconsciousness.  They did state that nursing home reported the patient did not hit her head or hit the floor.  She was in a standing position and was seated down into a chair.  When they arrived, patient was alert and oriented.  Patient states that she fell back in the chair.  Denies falling down hitting her head.  She denies any preceding chest pain.  She does report that she has felt slightly tired over the last few days but otherwise has been in her normal state of health.  She states that she thinks she overdid a PT today which is why she had the episode.  On ED arrival, she reports feeling tired but denies any pain.  Patient denies any vision changes, chest pain, difficulty breathing, numbness/weakness of her arms or legs, nausea/vomiting, abdominal pain, vision changes, SOB.  The history is provided by the patient and the EMS personnel.    Past Medical History:  Diagnosis Date  . Blood transfusion without reported diagnosis   . Chronic back pain   . Chronic constipation   . Dysrhythmia    Afib with RVR - 2015, Oaroxyomal Afib  . GI bleed   . Glaucoma   . Syncope 2015  . Thyroid disease   . TIA (transient ischemic attack) 2015    Patient Active Problem List   Diagnosis Date Noted  . Closed 4-part fracture of proximal humerus, right, initial encounter 10/09/2017  .  Paroxysmal A-fib ([place]) 09/21/2017  . Closed displaced fracture of right femoral neck ([place]) 09/21/2017  . Closed fracture of right proximal humerus 09/21/2017  . Dehydration 09/21/2017  . Hyponatremia 09/21/2017  . Elevated CK 09/21/2017  . Thyroid nodule 09/21/2017  . Closed right hip fracture ([place]) 09/21/2017  . Chronic diastolic CHF (congestive heart failure) ([place]) 09/21/2017  . Sinus tachycardia 09/21/2017  . Acute lower UTI 09/21/2017  . TIA (transient ischemic attack) 08/25/2014  . Aphasia 08/25/2014  . Chronic constipation 05/19/2014  . Fracture of multiple pubic rami ([place]) 04/11/2014  . Pubic bone fracture ([place]) 04/11/2014  . Left hip pain 04/11/2014  . Atrial fibrillation with RVR ([place]) 03/28/2013  . Diarrhea 03/28/2013  . Hypothyroidism 03/24/2013  . Syncope 03/24/2013  . Glaucoma 03/24/2013  . Hypokalemia 03/24/2013  . Acute gastroenteritis 03/24/2013    Past Surgical History:  Procedure Laterality Date  . CATARACT EXTRACTION, BILATERAL    . COLONOSCOPY    . HIP ARTHROPLASTY Right 09/22/2017   Procedure: ARTHROPLASTY BIPOLAR HIP (HEMIARTHROPLASTY);  Surgeon: [name], MD;  Location: [facility];  Service: Orthopedics;  Laterality: Right;  . JOINT REPLACEMENT  11/20/2011   LTKR  . REVERSE SHOULDER ARTHROPLASTY Right 10/09/2017  . REVERSE SHOULDER ARTHROPLASTY Right 10/09/2017   Procedure: REVERSE SHOULDER ARTHROPLASTY;  Surgeon: [name], MD;  Location: [facility];  Service: Orthopedics;  Laterality: Right;     OB History   None      Home Medications    Prior to Admission medications   Medication Sig Start Date End Date Taking? Authorizing Provider  acetaminophen (TYLENOL) 500 MG tablet Take 2 tablets (1,000 mg total) by mouth every 8 (eight) hours for 14 days. 10/10/17 10/24/17  [name], MD  atorvastatin (LIPITOR) 20 MG tablet Take 1 tablet (20 mg total) by mouth daily. 08/26/14   Delfina Redwood, MD  celecoxib (CELEBREX) 100 MG capsule Take 1 capsule  (100 mg total) by mouth daily. 10/10/17 10/10/18  [name], MD  cetirizine (ZYRTEC) 10 MG tablet Take 10 mg by mouth daily. 08/17/17   [provider]  clopidogrel (PLAVIX) 75 MG tablet Take 1 tablet (75 mg total) by mouth daily. Patient not taking: Reported on 09/21/2014 08/26/14   Delfina [place]wood, MD  docusate sodium (COLACE) 100 MG capsule Take 100 mg by mouth 2 (two) times daily.    [provider]  enoxaparin (LOVENOX) 40 MG/0.4ML injection Inject 0.4 mLs (40 mg total) into the skin daily. 09/24/17 09/24/18  [name], MD  latanoprost (XALATAN) 0.005 % ophthalmic solution Place 1 drop into both eyes at bedtime.    [provider]  levothyroxine (SYNTHROID, LEVOTHROID) 88 MCG tablet Take 88 mcg by mouth daily. 08/27/14   [provider]  Lidocaine (ASPERCREME LIDOCAINE) 4 % PTCH Apply 1 patch topically See admin instructions. Apply to right shoulder in the morning    [provider]  ondansetron (ZOFRAN) 4 MG tablet Take 1 tablet (4 mg total) by mouth every 8 (eight) hours as needed for up to 7 days for nausea or vomiting. 10/10/17 10/17/17  [name], MD  oxyCODONE (OXY IR/ROXICODONE) 5 MG immediate release tablet Take 1 pills every 4-6 hrs as needed for pain 10/10/17   [name], MD  polyethylene glycol (MIRALAX / Floria Raveling) packet Take 17 g by mouth daily as needed for mild constipation. Patient taking differently: Take 17 g by mouth See admin instructions. Give 17 g mixed in 5-8 oz of water and drink daily. Give 17 g mixed in 5-8 oz of water and drink daily as needed for constipation. 09/24/17   Shelly Coss, MD  timolol (TIMOPTIC) 0.5 % ophthalmic solution Place 1 drop into both eyes 2 (two) times daily.    [provider]    Family History Family History  Problem Relation Age of Onset  . Osteoporosis Mother   . Cancer - Colon Father   . Schizophrenia Son     Social History Social History   Tobacco Use  . Smoking status:  Never Smoker  . Smokeless tobacco: Never Used  Substance Use Topics  . Alcohol use: No  . Drug use: No     Allergies   Aspirin; Epinephrine; Penicillins; and Codeine   Review of Systems Review of Systems  Constitutional: Negative for fever.  Respiratory: Negative for cough and shortness of breath.   Cardiovascular: Negative for chest pain.  Gastrointestinal: Negative for abdominal pain, nausea and vomiting.  Genitourinary: Negative for dysuria and hematuria.  Neurological: Positive for syncope. Negative for weakness, numbness and headaches.  All other systems reviewed and are negative.    Physical Exam Updated Vital Signs BP (!) 124/97 (BP Location: Right Arm)   Pulse (!) 109   Temp 97.7 F (36.5 C) (Oral)   Resp 16   SpO2 98%   Physical Exam  Constitutional: She is oriented to person, place, and time. She appears well-developed and well-nourished.  HENT:  Head: Normocephalic and atraumatic.  Mouth/Throat: Oropharynx is clear and moist and mucous membranes are normal.  No tenderness to palpation of skull. No deformities or crepitus noted. No open wounds, abrasions or lacerations.   Eyes: Pupils are equal, round, and reactive to light. Conjunctivae, EOM and lids are normal.  Neck: Full passive range of motion without pain.  Cardiovascular: Normal rate, regular rhythm, normal heart sounds and normal pulses. Exam reveals no gallop and no friction rub.  No murmur heard. Pulses:      Radial pulses are 2+ on the right side, and 2+ on the left side.       Dorsalis pedis pulses are 2+ on the right side, and 2+ on the left side.  Pulmonary/Chest: Effort normal and breath sounds normal.  Lungs clear to auscultation bilaterally.  Symmetric chest rise.  No wheezing, rales, rhonchi.  Abdominal: Soft. Normal appearance. There is no tenderness. There is no rigidity and no guarding.  Musculoskeletal: Normal range of motion.  Right shoulder with immobilizer in place.  Limited range  of motion of right shoulder secondary to pre-existing surgery.   Neurological: She is alert and oriented to person, place, and time.  Cranial nerves III-XII intact Follows commands, Moves all extremities  5/5 strength to LUE and BLE.  Minute strength of right upper extremity secondary to immobilization.  Equal grip strength bilaterally. Sensation intact throughout all major nerve distributions Normal coordination No slurred speech. No facial droop.   Skin: Skin is warm and dry. Capillary refill takes less than 2 seconds.  Psychiatric: She has a normal mood and affect. Her speech is normal.  Nursing note and vitals reviewed.    ED Treatments / Results  Labs (all labs ordered are listed, but only abnormal results are displayed) Labs Reviewed  CBC WITH DIFFERENTIAL/PLATELET - Abnormal; Notable for the following components:      Result Value   RBC 2.87 (*)    Hemoglobin 8.6 (*)    HCT 27.7 (*)    Lymphs Abs 0.6 (*)    All other components within normal limits  BASIC METABOLIC PANEL - Abnormal; Notable for the following components:   Sodium 131 (*)    Chloride 95 (*)    Glucose, Bld 124 (*)    Calcium 8.2 (*)    All other components within normal limits  I-STAT TROPONIN, ED    EKG EKG Interpretation  Date/Time:  Monday October 14 2017 11:23:54 EDT Ventricular Rate:  91 PR Interval:  140 QRS Duration: 78 QT Interval:  362 QTC Calculation: 445 R Axis:   57 Text Interpretation:  Normal sinus rhythm Normal ECG Confirmed by Dene Gentry [phone]) on 10/14/2017 11:29:19 AM   Radiology No results found.  Procedures Procedures (including critical care time)  Medications Ordered in ED Medications - No data to display   Initial Impression / Assessment and Plan / ED Course  I have reviewed the triage vital signs and the nursing notes.  Pertinent labs & imaging results that were available during my care of the patient were reviewed by me and considered in my medical  decision making (see chart for details).     84 year old female who presents for evaluation of syncopal episode that occurred while patient was doing PT.  She states she has been fatigued over the last few days but states that when she went to do PT this morning, was too  much for her.  She reported feeling slightly lightheaded.  No preceding chest pain.  She states she did not lose consciousness.  Unclear if she did.  She reports she did not hit the floor hit her head.  She reports that she went from a standing to seated in a chair position.  No complaints at this time. Patient is afebrile, non-toxic appearing, sitting comfortably on examination table. Vital signs reviewed and stable.  No neuro deficits noted on exam.  Consider near syncope/syncope from doing PT versus infectious etiology versus cardiac etiology.  Plan to check basic labs, EKG.  No indication for CT head imaging as patient did not hit her head.  She is answering questions appropriately and has no neuro deficits noted on exam.  BMP markable.  CBC shows no leukocytosis.  Hemoglobin is 8.6.  Review of records show the patient has had similar hemoglobins in the past.  It looks like at the beginning of September, she was ranging around the 9 level.  Patient denies any black or tarry stools.  Do not suspect GI bleed or symptom back anemia on history/physical exam.  Troponin negative.  EKG is unremarkable.  Discussed results with patient.  Patient has no complaints at this time.  I personally ambulated patient in the department she was able to ambulate without any difficulty.  She did require holding my hand which she states has been new since going to rehab secondary to her shoulder issues and chronic knee pain.  She denied any symptoms while ambulating.  I discussed treatment options with patient.  I offered admission for observation given the syncopal episode.  Patient did not want to be admitted.  She states that she would have rather gone back  to the nursing home facility.  She has hemodynamically stable.  I suspect her syncope was related to PT.  I discussed with Dr. Francia Greaves who agrees with plan.  Will plan to have patient follow-up with primary care doctor regarding anemia. Patient had ample opportunity for questions and discussion. All patient's questions were answered with full understanding. Strict return precautions discussed. Patient expresses understanding and agreement to plan.   Final Clinical Impressions(s) / ED Diagnoses   Final diagnoses:  Near syncope    ED Discharge Orders    None       Desma Mcgregor 10/14/17 1818    Valarie Merino, MD 10/15/17 1008

## 2017-10-14 NOTE — Discharge Instructions (Addendum)
As we discussed, today your blood levels were slightly low.  It looks like you have a history of anemia.  Please follow-up with your primary care doctor to have your blood redrawn to make sure that this is not getting worse.  Return to the emergency department for any chest pain, difficulty breathing, repeat syncopal episodes, abdominal pain, blood in stool or any other worsening or concerning symptoms.

## 2017-10-14 NOTE — ED Triage Notes (Signed)
Pt here from [facility] after syncopal episode this morning. Pt was receiving physical therapy for her injuries from her fall September 18th when she became dizzy and had a syncopal episode. No fall per staff. Pt on plavix. Only complaint is chronic knee pain. Denies chest pain/shortness of breath. VSS. Bed locked in lowest position.

## 2017-10-14 NOTE — ED Notes (Signed)
Family requesting to speak with PA prior to discharge and transport back to facility

## 2017-12-17 ENCOUNTER — Emergency Department (HOSPITAL_COMMUNITY): Payer: Medicare Other

## 2017-12-17 ENCOUNTER — Encounter (HOSPITAL_COMMUNITY): Payer: Self-pay | Admitting: Emergency Medicine

## 2017-12-17 ENCOUNTER — Other Ambulatory Visit: Payer: Self-pay

## 2017-12-17 ENCOUNTER — Inpatient Hospital Stay (HOSPITAL_COMMUNITY)
Admission: EM | Admit: 2017-12-17 | Discharge: 2017-12-24 | DRG: 481 | Disposition: A | Discharge status: Skilled Nursing Facility | Payer: Medicare Other | Attending: Internal Medicine | Admitting: Internal Medicine

## 2017-12-17 DIAGNOSIS — I5032 Chronic diastolic (congestive) heart failure: Secondary | ICD-10-CM | POA: Diagnosis present

## 2017-12-17 DIAGNOSIS — S72461A Displaced supracondylar fracture with intracondylar extension of lower end of right femur, initial encounter for closed fracture: Principal | ICD-10-CM | POA: Diagnosis present

## 2017-12-17 DIAGNOSIS — E039 Hypothyroidism, unspecified: Secondary | ICD-10-CM | POA: Diagnosis not present

## 2017-12-17 DIAGNOSIS — H409 Unspecified glaucoma: Secondary | ICD-10-CM | POA: Diagnosis present

## 2017-12-17 DIAGNOSIS — Z8262 Family history of osteoporosis: Secondary | ICD-10-CM

## 2017-12-17 DIAGNOSIS — I493 Ventricular premature depolarization: Secondary | ICD-10-CM | POA: Diagnosis present

## 2017-12-17 DIAGNOSIS — E785 Hyperlipidemia, unspecified: Secondary | ICD-10-CM | POA: Diagnosis present

## 2017-12-17 DIAGNOSIS — M9701XA Periprosthetic fracture around internal prosthetic right hip joint, initial encounter: Secondary | ICD-10-CM | POA: Diagnosis present

## 2017-12-17 DIAGNOSIS — W19XXXA Unspecified fall, initial encounter: Secondary | ICD-10-CM | POA: Diagnosis present

## 2017-12-17 DIAGNOSIS — W010XXA Fall on same level from slipping, tripping and stumbling without subsequent striking against object, initial encounter: Secondary | ICD-10-CM | POA: Diagnosis present

## 2017-12-17 DIAGNOSIS — M810 Age-related osteoporosis without current pathological fracture: Secondary | ICD-10-CM | POA: Diagnosis present

## 2017-12-17 DIAGNOSIS — Z8673 Personal history of transient ischemic attack (TIA), and cerebral infarction without residual deficits: Secondary | ICD-10-CM

## 2017-12-17 DIAGNOSIS — I480 Paroxysmal atrial fibrillation: Secondary | ICD-10-CM | POA: Diagnosis present

## 2017-12-17 DIAGNOSIS — D62 Acute posthemorrhagic anemia: Secondary | ICD-10-CM | POA: Diagnosis present

## 2017-12-17 DIAGNOSIS — Y92003 Bedroom of unspecified non-institutional (private) residence as the place of occurrence of the external cause: Secondary | ICD-10-CM

## 2017-12-17 DIAGNOSIS — D649 Anemia, unspecified: Secondary | ICD-10-CM | POA: Diagnosis present

## 2017-12-17 DIAGNOSIS — S72401D Unspecified fracture of lower end of right femur, subsequent encounter for closed fracture with routine healing: Secondary | ICD-10-CM | POA: Diagnosis not present

## 2017-12-17 DIAGNOSIS — S7290XA Unspecified fracture of unspecified femur, initial encounter for closed fracture: Secondary | ICD-10-CM | POA: Diagnosis present

## 2017-12-17 DIAGNOSIS — S72401A Unspecified fracture of lower end of right femur, initial encounter for closed fracture: Secondary | ICD-10-CM | POA: Diagnosis present

## 2017-12-17 DIAGNOSIS — E440 Moderate protein-calorie malnutrition: Secondary | ICD-10-CM | POA: Diagnosis present

## 2017-12-17 DIAGNOSIS — S72351A Displaced comminuted fracture of shaft of right femur, initial encounter for closed fracture: Secondary | ICD-10-CM | POA: Diagnosis present

## 2017-12-17 DIAGNOSIS — I471 Supraventricular tachycardia: Secondary | ICD-10-CM | POA: Diagnosis present

## 2017-12-17 DIAGNOSIS — Z7982 Long term (current) use of aspirin: Secondary | ICD-10-CM

## 2017-12-17 DIAGNOSIS — Z96652 Presence of left artificial knee joint: Secondary | ICD-10-CM | POA: Diagnosis present

## 2017-12-17 DIAGNOSIS — Z09 Encounter for follow-up examination after completed treatment for conditions other than malignant neoplasm: Secondary | ICD-10-CM

## 2017-12-17 DIAGNOSIS — Z96611 Presence of right artificial shoulder joint: Secondary | ICD-10-CM | POA: Diagnosis present

## 2017-12-17 DIAGNOSIS — S7291XA Unspecified fracture of right femur, initial encounter for closed fracture: Secondary | ICD-10-CM | POA: Diagnosis not present

## 2017-12-17 DIAGNOSIS — W19XXXD Unspecified fall, subsequent encounter: Secondary | ICD-10-CM | POA: Diagnosis not present

## 2017-12-17 DIAGNOSIS — Z419 Encounter for procedure for purposes other than remedying health state, unspecified: Secondary | ICD-10-CM

## 2017-12-17 DIAGNOSIS — I110 Hypertensive heart disease with heart failure: Secondary | ICD-10-CM | POA: Diagnosis present

## 2017-12-17 LAB — BASIC METABOLIC PANEL
ANION GAP: 6 (ref 5–15)
BUN: 15 mg/dL (ref 8–23)
CO2: 26 mmol/L (ref 22–32)
Calcium: 8.5 mg/dL — ABNORMAL LOW (ref 8.9–10.3)
Chloride: 106 mmol/L (ref 98–111)
Creatinine, Ser: 0.61 mg/dL (ref 0.44–1.00)
GLUCOSE: 155 mg/dL — AB (ref 70–99)
POTASSIUM: 3.8 mmol/L (ref 3.5–5.1)
Sodium: 138 mmol/L (ref 135–145)

## 2017-12-17 LAB — CBC WITH DIFFERENTIAL/PLATELET
Abs Immature Granulocytes: 0.02 10*3/uL (ref 0.00–0.07)
Basophils Absolute: 0.1 10*3/uL (ref 0.0–0.1)
Basophils Relative: 1 %
Eosinophils Absolute: 0.1 10*3/uL (ref 0.0–0.5)
Eosinophils Relative: 2 %
HCT: 33.5 % — ABNORMAL LOW (ref 36.0–46.0)
Hemoglobin: 10.1 g/dL — ABNORMAL LOW (ref 12.0–15.0)
IMMATURE GRANULOCYTES: 0 %
LYMPHS ABS: 2.2 10*3/uL (ref 0.7–4.0)
LYMPHS PCT: 28 %
MCH: 27.5 pg (ref 26.0–34.0)
MCHC: 30.1 g/dL (ref 30.0–36.0)
MCV: 91.3 fL (ref 80.0–100.0)
Monocytes Absolute: 0.7 10*3/uL (ref 0.1–1.0)
Monocytes Relative: 9 %
NEUTROS ABS: 4.9 10*3/uL (ref 1.7–7.7)
NEUTROS PCT: 60 %
PLATELETS: 224 10*3/uL (ref 150–400)
RBC: 3.67 MIL/uL — ABNORMAL LOW (ref 3.87–5.11)
RDW: 14.4 % (ref 11.5–15.5)
WBC: 8 10*3/uL (ref 4.0–10.5)
nRBC: 0 % (ref 0.0–0.2)

## 2017-12-17 LAB — SURGICAL PCR SCREEN
MRSA, PCR: NEGATIVE
Staphylococcus aureus: NEGATIVE

## 2017-12-17 LAB — MAGNESIUM: MAGNESIUM: 1.8 mg/dL (ref 1.7–2.4)

## 2017-12-17 MED ORDER — POLYETHYLENE GLYCOL 3350 17 G PO PACK
17.0000 g | PACK | Freq: Every day | ORAL | Status: DC | PRN
  Administered 2017-12-21: 17 g via ORAL
  Filled 2017-12-17: qty 1

## 2017-12-17 MED ORDER — LEVOTHYROXINE SODIUM 88 MCG PO TABS
88.0000 ug | ORAL_TABLET | Freq: Every day | ORAL | Status: DC
  Administered 2017-12-18 – 2017-12-24 (×7): 88 ug via ORAL
  Filled 2017-12-17 (×8): qty 1

## 2017-12-17 MED ORDER — MORPHINE SULFATE (PF) 2 MG/ML IV SOLN
0.5000 mg | INTRAVENOUS | Status: DC | PRN
  Administered 2017-12-17 (×3): 0.5 mg via INTRAVENOUS
  Filled 2017-12-17 (×3): qty 1

## 2017-12-17 MED ORDER — LATANOPROST 0.005 % OP SOLN
1.0000 [drp] | Freq: Every day | OPHTHALMIC | Status: DC
  Administered 2017-12-20 – 2017-12-23 (×2): 1 [drp] via OPHTHALMIC
  Filled 2017-12-17 (×2): qty 2.5

## 2017-12-17 MED ORDER — MORPHINE SULFATE (PF) 4 MG/ML IV SOLN
4.0000 mg | Freq: Once | INTRAVENOUS | Status: AC
  Administered 2017-12-17: 4 mg via INTRAVENOUS
  Filled 2017-12-17: qty 1

## 2017-12-17 MED ORDER — SODIUM CHLORIDE 0.9 % IV SOLN
INTRAVENOUS | Status: AC
  Administered 2017-12-17 (×2): via INTRAVENOUS

## 2017-12-17 MED ORDER — TIMOLOL MALEATE 0.5 % OP SOLN
1.0000 [drp] | Freq: Two times a day (BID) | OPHTHALMIC | Status: DC
  Administered 2017-12-17 – 2017-12-24 (×14): 1 [drp] via OPHTHALMIC
  Filled 2017-12-17 (×2): qty 5

## 2017-12-17 MED ORDER — INFLUENZA VAC SPLIT HIGH-DOSE 0.5 ML IM SUSY
0.5000 mL | PREFILLED_SYRINGE | INTRAMUSCULAR | Status: DC
  Filled 2017-12-17 (×2): qty 0.5

## 2017-12-17 MED ORDER — ONDANSETRON HCL 4 MG/2ML IJ SOLN
4.0000 mg | Freq: Once | INTRAMUSCULAR | Status: AC
  Administered 2017-12-17: 4 mg via INTRAVENOUS
  Filled 2017-12-17: qty 2

## 2017-12-17 MED ORDER — HYDROMORPHONE HCL 1 MG/ML IJ SOLN
0.5000 mg | Freq: Once | INTRAMUSCULAR | Status: AC
  Administered 2017-12-17: 0.5 mg via INTRAVENOUS
  Filled 2017-12-17: qty 1

## 2017-12-17 MED ORDER — MAGNESIUM OXIDE 400 (241.3 MG) MG PO TABS
200.0000 mg | ORAL_TABLET | Freq: Two times a day (BID) | ORAL | Status: DC
  Administered 2017-12-17 (×2): 200 mg via ORAL
  Filled 2017-12-17 (×2): qty 1

## 2017-12-17 NOTE — ED Notes (Signed)
ED TO INPATIENT HANDOFF REPORT  Name/Age/Gender Stephanie Donovan 84 y.o. female  Code Status Code Status History    Date Active Date Inactive Code Status Order ID Comments User Context   10/09/2017 1515 10/10/2017 1958 Full Code [id number]  [name], MD Inpatient   09/21/2017 2022 09/25/2017 0156 Full Code [id number]  Toy Baker, MD ED   08/25/2014 0951 08/26/2014 1917 Full Code [id number]  Florencia Reasons, MD Inpatient   04/11/2014 2041 04/14/2014 1850 Full Code [id number]  Theressa Millard, MD Inpatient   03/28/2013 1441 03/29/2013 1948 Full Code [id number]  Orson Eva, MD Inpatient   03/25/2013 0025 03/27/2013 1759 Full Code [id number]  Dhungel, Flonnie Overman, MD Inpatient    Advance Directive Documentation     Most Recent Value  Type of Advance Directive  Living will  Pre-existing out of facility DNR order (yellow form or pink MOST form)  -  "MOST" Form in Place?  -      Home/SNF/Other Home  Chief Complaint Fall  Level of Care/Admitting Diagnosis ED Disposition    ED Disposition Condition [place]: [facility] [100102]  Level of Care: Telemetry [5]  Admit to tele based on following criteria: Other see comments  Comments: tachypnic  Diagnosis: Femur fracture [facility]) [384665]  Admitting Physician: Shela Leff [[id number]]  Attending Physician: Shela Leff [[id number]]  PT Class (Do Not Modify): Observation [104]  PT Acc Code (Do Not Modify): Observation [10022]       Medical History Past Medical History:  Diagnosis Date  . Blood transfusion without reported diagnosis   . Chronic back pain   . Chronic constipation   . Dysrhythmia    Afib with RVR - 2015, Oaroxyomal Afib  . GI bleed   . Glaucoma   . Syncope 2015  . Thyroid disease   . TIA (transient ischemic attack) 2015    Allergies Allergies  Allergen Reactions  . Aspirin Palpitations and Other (See Comments)    Mixed reactions  . Epinephrine Other (See Comments)   Rapped heart beat  . Penicillins Other (See Comments)    Lost hearing temporarily  . Codeine Palpitations    IV Location/Drains/Wounds Patient Lines/Drains/Airways Status   Active Line/Drains/Airways    Name:   Placement date:   Placement time:   Site:   Days:   Peripheral IV 10/10/17 Anterior;Left;Upper Arm   10/10/17    0837    Arm   68   Peripheral IV 12/17/17 Right Antecubital   12/17/17    0007    Antecubital   less than 1   Incision (Closed) 09/22/17 Leg Right   09/22/17    1757     86   Incision (Closed) 10/09/17 Arm Right   10/09/17    1309     69          Labs/Imaging Results for orders placed or performed during the hospital encounter of 12/17/17 (from the past 48 hour(s))  CBC with Differential     Status: Abnormal   Collection Time: 12/17/17 12:24 AM  Result Value Ref Range   WBC 8.0 4.0 - 10.5 K/uL   RBC 3.67 (L) 3.87 - 5.11 MIL/uL   Hemoglobin 10.1 (L) 12.0 - 15.0 g/dL   HCT 33.5 (L) 36.0 - 46.0 %   MCV 91.3 80.0 - 100.0 fL   MCH 27.5 26.0 - 34.0 pg   MCHC 30.1 30.0 - 36.0 g/dL   RDW 14.4 11.5 - 15.5 %  Platelets 224 150 - 400 K/uL   nRBC 0.0 0.0 - 0.2 %   Neutrophils Relative % 60 %   Neutro Abs 4.9 1.7 - 7.7 K/uL   Lymphocytes Relative 28 %   Lymphs Abs 2.2 0.7 - 4.0 K/uL   Monocytes Relative 9 %   Monocytes Absolute 0.7 0.1 - 1.0 K/uL   Eosinophils Relative 2 %   Eosinophils Absolute 0.1 0.0 - 0.5 K/uL   Basophils Relative 1 %   Basophils Absolute 0.1 0.0 - 0.1 K/uL   Immature Granulocytes 0 %   Abs Immature Granulocytes 0.02 0.00 - 0.07 K/uL    Comment: Performed at [facility], [place] [address] [place], [place] [address]  Basic metabolic panel     Status: Abnormal   Collection Time: 12/17/17 12:24 AM  Result Value Ref Range   Sodium 138 135 - 145 mmol/L   Potassium 3.8 3.5 - 5.1 mmol/L   Chloride 106 98 - 111 mmol/L   CO2 26 22 - 32 mmol/L   Glucose, Bld 155 (H) 70 - 99 mg/dL   BUN 15 8 - 23 mg/dL   Creatinine, Ser 0.61  0.44 - 1.00 mg/dL   Calcium 8.5 (L) 8.9 - 10.3 mg/dL   GFR calc non Af Amer >60 >60 mL/min   GFR calc Af Amer >60 >60 mL/min    Comment: (NOTE) The eGFR has been calculated using the CKD EPI equation. This calculation has not been validated in all clinical situations. eGFR's persistently <60 mL/min signify possible Chronic Kidney Disease.    Anion gap 6 5 - 15    Comment: Performed at [facility], [place] [address] [place], [place] [address]   Dg Hip Unilat W Or Wo Pelvis 1 View Right  Result Date: 12/17/2017 CLINICAL DATA:  Trip and fall walking to the bathroom tonight with right hip and leg pain. EXAM: DG HIP (WITH OR WITHOUT PELVIS) 1V RIGHT COMPARISON:  Pelvis radiograph 09/22/2017 FINDINGS: Unipolar right hip arthroplasty in expected alignment. No periprosthetic fracture. Femoral shaft fractures partially included. Remote left inferior pubic ramus fracture. Bones are diffusely under mineralized. Calcified pelvic lymph nodes again seen. IMPRESSION: Femoral shaft fracture partially included. Right hip arthroplasty is intact without periprosthetic involvement or additional acute fracture of the pelvis. Electronically Signed   By: Keith Rake M.D.   On: 12/17/2017 01:33   Dg Femur Min 2 Views Right  Result Date: 12/17/2017 CLINICAL DATA:  Trip and fall walking to the bathroom tonight with right hip and leg pain. EXAM: RIGHT FEMUR 2 VIEWS COMPARISON:  Right hip radiograph 09/22/2017 FINDINGS: Right hip arthroplasty in expected alignment. No periprosthetic lucency. Comminuted fracture of the mid distal femoral shaft extending into the femoral metaphysis. There is intra-articular extension to the intercondylar notch, not well assessed on full field of view femur x-ray. Medial compartment osteoarthritis of the knee. The bones are diffusely under mineralized. IMPRESSION: Comminuted displaced mid distal femoral shaft fracture extending into the metaphysis. Nondisplaced  extension into the knee joint in the intercondylar notch. Electronically Signed   By: Keith Rake M.D.   On: 12/17/2017 01:31    Pending Labs Unresulted Labs (From admission, onward)   None      Vitals/Pain Today's Vitals   12/17/17 0134 12/17/17 0207 12/17/17 0211 12/17/17 0230  BP: 140/70  126/71 128/67  Pulse: 79  95 100  Resp: (!) 24  (!) 30 (!) 27  Temp:      TempSrc:  SpO2: 100%  97% (!) 62%  Weight:      Height:      PainSc:  10-Worst pain ever      Isolation Precautions No active isolations  Medications Medications  morphine 4 MG/ML injection 4 mg (4 mg Intravenous Given 12/17/17 0027)  ondansetron (ZOFRAN) injection 4 mg (4 mg Intravenous Given 12/17/17 0024)  HYDROmorphone (DILAUDID) injection 0.5 mg (0.5 mg Intravenous Given 12/17/17 0131)    Mobility non-ambulatory

## 2017-12-17 NOTE — ED Notes (Signed)
Report given to Chloe,RN

## 2017-12-17 NOTE — ED Triage Notes (Signed)
-  Patient was walking to [place], reported that she got tripped up on her slipper and her leg gave out, hurt her R knee, did not hit head, no LOC, but does not remember how she fell or how she landed -C/C R knee pain, swelling, brusing and crepitus, EMS reports some deformity, unable to be sure with swelling, shortening and inward rotation of the R leg -Good pulse, cap refill in extremity, no blood thinners  -150 mcg Fentanyl en route, patient still reports pain -IV 20 RAC placed by EMS  -Vitals -BP 148/92 -HR 100 -RR 20 -97% RA

## 2017-12-17 NOTE — ED Notes (Signed)
Called Ortho Tech  @0216 

## 2017-12-17 NOTE — ED Notes (Signed)
Bed: WA06 Expected date:  Expected time:  Means of arrival:  Comments: 84 yr old fall, right leg pain, knee swelling

## 2017-12-17 NOTE — H&P (Signed)
History and Physical    Stephanie Donovan EVO:[id number] DOB: 1927-05-14 DOA: 12/17/2017  PCP: [facility] Patient coming from: Home  Chief Complaint: Fall right leg pain  HPI: Stephanie Donovan is a 84 y.o. female with medical history significant of hypothyroidism, TIA, GI bleed, paroxysmal A. fib, chronic diastolic congestive heart failure presenting to the ED for evaluation after a fall.  Patient states she was walking from her bedroom to the bathroom and had to pass a narrow space.  Somehow her slippers got caught in something and she fell on her right knee.  She could not get up after the fall.  Denies hitting her head.  Denies loss of consciousness.  Denies having any dizziness, chest pain, or shortness of breath at the time of the fall.  Reports having pain in her right thigh.  ED Course: Afebrile, blood pressure stable.  No leukocytosis.  Hemoglobin 10.1, was 8.6 two months ago.   X-ray of right femur showing comminuted displaced mid distal femoral shaft fracture extending into the metaphysis.  Nondisplaced extension into the knee joint in the inter-condylar notch.  X-ray of right hip and pelvis showing stable right hip arthroplasty without evidence of acute fracture. ED physician discussed the case with orthopedics (Dr. Marcelino Scot) who recommended keeping the patient n.p.o., knee immobilizer in Buck's traction ordered.  Ortho will see the patient in the morning.  Review of Systems: As per HPI otherwise 10 point review of systems negative.  Past Medical History:  Diagnosis Date  . Blood transfusion without reported diagnosis   . Chronic back pain   . Chronic constipation   . Dysrhythmia    Afib with RVR - 2015, Oaroxyomal Afib  . GI bleed   . Glaucoma   . Syncope 2015  . Thyroid disease   . TIA (transient ischemic attack) 2015    Past Surgical History:  Procedure Laterality Date  . CATARACT EXTRACTION, BILATERAL    . COLONOSCOPY    . HIP  ARTHROPLASTY Right 09/22/2017   Procedure: ARTHROPLASTY BIPOLAR HIP (HEMIARTHROPLASTY);  Surgeon: [name], MD;  Location: [facility];  Service: Orthopedics;  Laterality: Right;  . JOINT REPLACEMENT  11/20/2011   LTKR  . REVERSE SHOULDER ARTHROPLASTY Right 10/09/2017  . REVERSE SHOULDER ARTHROPLASTY Right 10/09/2017   Procedure: REVERSE SHOULDER ARTHROPLASTY;  Surgeon: [name], MD;  Location: [facility];  Service: Orthopedics;  Laterality: Right;     reports that she has never smoked. She has never used smokeless tobacco. She reports that she does not drink alcohol or use drugs.  Allergies  Allergen Reactions  . Aspirin Palpitations and Other (See Comments)    Mixed reactions  . Epinephrine Other (See Comments)    Rapped heart beat  . Penicillins Other (See Comments)    Lost hearing temporarily  . Codeine Palpitations    Family History  Problem Relation Age of Onset  . Osteoporosis Mother   . Cancer - Colon Father   . Schizophrenia Son     Prior to Admission medications   Medication Sig Start Date End Date Taking? Authorizing Provider  latanoprost (XALATAN) 0.005 % ophthalmic solution Place 1 drop into both eyes at bedtime.   Yes [provider]  levothyroxine (SYNTHROID, LEVOTHROID) 88 MCG tablet Take 88 mcg by mouth daily. 08/27/14  Yes [provider]  timolol (TIMOPTIC) 0.5 % ophthalmic solution Place 1 drop into both eyes 2 (two) times daily.   Yes [provider]  atorvastatin (  LIPITOR) 20 MG tablet Take 1 tablet (20 mg total) by mouth daily. Patient not taking: Reported on 12/17/2017 08/26/14   Delfina Redwood, MD  celecoxib (CELEBREX) 100 MG capsule Take 1 capsule (100 mg total) by mouth daily. Patient not taking: Reported on 12/17/2017 10/10/17 10/10/18  [name], MD  clopidogrel (PLAVIX) 75 MG tablet Take 1 tablet (75 mg total) by mouth daily. Patient not taking: Reported on 09/21/2014 08/26/14   Delfina Redwood, MD  enoxaparin (LOVENOX)  40 MG/0.4ML injection Inject 0.4 mLs (40 mg total) into the skin daily. Patient not taking: Reported on 12/17/2017 09/24/17 09/24/18  [name], MD  oxyCODONE (OXY IR/ROXICODONE) 5 MG immediate release tablet Take 1 pills every 4-6 hrs as needed for pain Patient not taking: Reported on 12/17/2017 10/10/17   [name], MD  polyethylene glycol (MIRALAX / Floria Raveling) packet Take 17 g by mouth daily as needed for mild constipation. Patient not taking: Reported on 12/17/2017 09/24/17   Shelly Coss, MD    Physical Exam: Vitals:   12/17/17 0211 12/17/17 0230 12/17/17 0331 12/17/17 0452  BP: 126/71 128/67 120/83 (!) 120/59  Pulse: 95 100 90 79  Resp: (!) 30 (!) 27 20 16   Temp:    97.8 F (36.6 C)  TempSrc:    Oral  SpO2: 97% 98% 100% 96%  Weight:    69.2 kg  Height:    5\' 3"  (1.6 m)    Physical Exam  Constitutional: She is oriented to person, place, and time. She appears well-developed and well-nourished. No distress.  HENT:  Head: Normocephalic.  Mouth/Throat: Oropharynx is clear and moist.  Eyes: Right eye exhibits no discharge. Left eye exhibits no discharge.  Neck: Neck supple.  Cardiovascular: Normal rate, regular rhythm and intact distal pulses.  Pulmonary/Chest: Effort normal and breath sounds normal. She has no wheezes. She has no rales.  Anterior lung fields clear to auscultation  Abdominal: Soft. Bowel sounds are normal. She exhibits no distension. There is no tenderness.  Musculoskeletal: She exhibits no edema.  Right lower extremity with immobilizer and in traction  Neurological: She is alert and oriented to person, place, and time.  Skin: Skin is warm and dry. She is not diaphoretic.  Psychiatric: She has a normal mood and affect. Her behavior is normal.     Labs on Admission: I have personally reviewed following labs and imaging studies  CBC: Recent Labs  Lab 12/17/17 0024  WBC 8.0  NEUTROABS 4.9  HGB 10.1*  HCT 33.5*  MCV 91.3  PLT 696   Basic Metabolic  Panel: Recent Labs  Lab 12/17/17 0024  NA 138  K 3.8  CL 106  CO2 26  GLUCOSE 155*  BUN 15  CREATININE 0.61  CALCIUM 8.5*   GFR: Estimated Creatinine Clearance: 45.4 mL/min (by C-G formula based on SCr of 0.61 mg/dL). Liver Function Tests: No results for input(s): AST, ALT, ALKPHOS, BILITOT, PROT, ALBUMIN in the last 168 hours. No results for input(s): LIPASE, AMYLASE in the last 168 hours. No results for input(s): AMMONIA in the last 168 hours. Coagulation Profile: No results for input(s): INR, PROTIME in the last 168 hours. Cardiac Enzymes: No results for input(s): CKTOTAL, CKMB, CKMBINDEX, TROPONINI in the last 168 hours. BNP (last 3 results) No results for input(s): PROBNP in the last 8760 hours. HbA1C: No results for input(s): HGBA1C in the last 72 hours. CBG: No results for input(s): GLUCAP in the last 168 hours. Lipid Profile: No results for  input(s): CHOL, HDL, LDLCALC, TRIG, CHOLHDL, LDLDIRECT in the last 72 hours. Thyroid Function Tests: No results for input(s): TSH, T4TOTAL, FREET4, T3FREE, THYROIDAB in the last 72 hours. Anemia Panel: No results for input(s): VITAMINB12, FOLATE, FERRITIN, TIBC, IRON, RETICCTPCT in the last 72 hours. Urine analysis:    Component Value Date/Time   COLORURINE YELLOW 09/21/2017 1530   APPEARANCEUR CLEAR 09/21/2017 1530   LABSPEC 1.018 09/21/2017 1530   PHURINE 6.0 09/21/2017 1530   GLUCOSEU NEGATIVE 09/21/2017 1530   HGBUR NEGATIVE 09/21/2017 1530   BILIRUBINUR NEGATIVE 09/21/2017 1530   KETONESUR 5 (A) 09/21/2017 1530   PROTEINUR 30 (A) 09/21/2017 1530   UROBILINOGEN 0.2 08/25/2014 0454   NITRITE NEGATIVE 09/21/2017 1530   LEUKOCYTESUR NEGATIVE 09/21/2017 1530    Radiological Exams on Admission: Dg Hip Unilat W Or Wo Pelvis 1 View Right  Result Date: 12/17/2017 CLINICAL DATA:  Trip and fall walking to the bathroom tonight with right hip and leg pain. EXAM: DG HIP (WITH OR WITHOUT PELVIS) 1V RIGHT COMPARISON:  Pelvis  radiograph 09/22/2017 FINDINGS: Unipolar right hip arthroplasty in expected alignment. No periprosthetic fracture. Femoral shaft fractures partially included. Remote left inferior pubic ramus fracture. Bones are diffusely under mineralized. Calcified pelvic lymph nodes again seen. IMPRESSION: Femoral shaft fracture partially included. Right hip arthroplasty is intact without periprosthetic involvement or additional acute fracture of the pelvis. Electronically Signed   By: Keith Rake M.D.   On: 12/17/2017 01:33   Dg Femur Min 2 Views Right  Result Date: 12/17/2017 CLINICAL DATA:  Trip and fall walking to the bathroom tonight with right hip and leg pain. EXAM: RIGHT FEMUR 2 VIEWS COMPARISON:  Right hip radiograph 09/22/2017 FINDINGS: Right hip arthroplasty in expected alignment. No periprosthetic lucency. Comminuted fracture of the mid distal femoral shaft extending into the femoral metaphysis. There is intra-articular extension to the intercondylar notch, not well assessed on full field of view femur x-ray. Medial compartment osteoarthritis of the knee. The bones are diffusely under mineralized. IMPRESSION: Comminuted displaced mid distal femoral shaft fracture extending into the metaphysis. Nondisplaced extension into the knee joint in the intercondylar notch. Electronically Signed   By: Keith Rake M.D.   On: 12/17/2017 01:31    EKG: EKG ordered in the ED pending at this time.  Assessment/Plan Principal Problem:   Femur fracture (HCC) Active Problems:   Hypothyroidism   TIA (transient ischemic attack)   Paroxysmal A-fib (HCC)   Chronic diastolic CHF (congestive heart failure) (HCC)   Fall   HLD (hyperlipidemia)   Chronic anemia   Femur fracture secondary to mechanical fall  X-ray of right femur showing comminuted displaced mid distal femoral shaft fracture extending into the metaphysis.  Nondisplaced extension into the knee joint in the inter-condylar notch. ED physician discussed  the case with orthopedics (Dr. Marcelino Scot) who recommended keeping the patient n.p.o., knee immobilizer in Buck's traction. Ortho will see the patient in the morning. -Keep n.p.o. -Pain management: Morphine 0.5 mg every 2 hours as needed, Tylenol PRN Codeine is listed as an allergy (palpitations) but patient was able to tolerate morphine and Dilaudid in the ED. -IV fluid hydration -Ortho following; appreciate recs  Hyperlipidemia Patient is currently not taking home Lipitor.  Unclear if discontinued by PCP.  Hypothyroidism -Continue home Synthroid  History of TIA Patient is currently not taking Plavix.  Unclear if discontinued by PCP.  Possibly due to advanced age and risk of falls.   Paroxysmal A. Fib -CHA2DS2VASc 6. Not on anticoagulation most likely secondary  to advanced age and risk of falls.  Not on rate control agent.  Currently rate controlled.  Chronic diastolic congestive heart failure -Currently not volume overloaded on exam.  Chronic anemia -Stable. Hemoglobin 10.1, was 8.6 two months ago.   DVT prophylaxis: SCD (left lower extremity) Code Status: Patient wishes to be full code Family Communication: No family available Disposition Plan: Anticipate discharge to SNF in 2 to 3 days. Consults called: Orthopedics (Dr. Marcelino Scot) Admission status: It is my clinical opinion that admission to [facility] is reasonable and necessary in this 83 y.o. female . presenting with symptoms of right thigh pain after mechanical fall, concerning for right femur fracture . in the context of PMH including: Advanced age, prior hip fracture . with pertinent positives on physical exam including: Right thigh pain . and pertinent positives on radiographic and laboratory data including: Imaging with evidence of right femur fracture. . Workup and treatment include IV pain management, IV fluid as patient is n.p.o. surgery will see the patient this morning.  She will likely go to the OR.  Given the  aforementioned, the predictability of an adverse outcome is felt to be significant. I expect that the patient will require at least 2 midnights in the hospital to treat this condition.    Shela Leff MD [facility] Pager [phone]  If 7PM-7AM, please contact night-coverage [email or web address] Password [facility]  12/17/2017, 6:43 AM

## 2017-12-17 NOTE — ED Notes (Signed)
Patient transported to X-ray 

## 2017-12-17 NOTE — Progress Notes (Signed)
[facility] called to set up transportation for patient to go to [facility].  Patients daughter, [place], also called but mailbox was full and could not leave a message.

## 2017-12-17 NOTE — Progress Notes (Signed)
PROGRESS NOTE    Stephanie Donovan  KDX:[id number] DOB: [birth year] DOA: 12/17/2017 PCP: [facility]    Brief Narrative: 84 year old with past medical history significant for hypothyroidism, TIA, GI bleed, paroxysmal A. fib, chronic diastolic heart failure who presents to the emergency department after mechanical fall.  She was walking when her slippers got caught in something and she fell on her right knee.  Patient was found to have right femoral comminuted displaced mid to distal femoral shaft fracture extending into the metaphysis.   Per orthopedic doctor, patient to to be transferred to [facility]  for surgery on 12/18/2017.  Assessment & Plan:   Principal Problem:   Femur fracture ([place]) Active Problems:   Hypothyroidism   TIA (transient ischemic attack)   Paroxysmal A-fib (HCC)   Chronic diastolic CHF (congestive heart failure) (HCC)   Fall   HLD (hyperlipidemia)   Chronic anemia  Right femur fracture mid shaft, extending into metaphysis; Per orthopedic doctor, patient to be transferred to [facility] for surgery on 12/18/2017 around 11:30 AM. Will resume diet, made patient n.p.o. after midnight.. Pain management, morphine IV as needed.  Hypothyroidism; Treated with Synthroid.  History of TIA; she was not taking Plavix, unclear why..   Paroxysmal A. Fib; chads score 6.  Not on on anticoagulation most likely due to advanced age and risk for bleeding.  Chronic diastolic heart failure; appears compensated.  Chronic anemia; Prior hemoglobin at 8.6      DVT prophylaxis: SCDs Code Status: Full code Family Communication: Care discussed with patient Disposition Plan: Transferred to [facility] for surgery on 12/18/2017   Consultants:   Ainsley Spinner, PA-C   Procedures:   None   Antimicrobials:   None   Subjective: She reports right knee pain. She denies chest pain shortness of breath.  Objective: Vitals:   12/17/17 0230  12/17/17 0331 12/17/17 0452 12/17/17 0855  BP: 128/67 120/83 (!) 120/59 118/69  Pulse: 100 90 79 97  Resp: (!) 27 20 16 19   Temp:   97.8 F (36.6 C) 98 F (36.7 C)  TempSrc:   Oral Oral  SpO2: 98% 100% 96% 99%  Weight:   69.2 kg   Height:   5\' 3"  (1.6 m)    No intake or output data in the 24 hours ending 12/17/17 1109 Filed Weights   12/17/17 0011 12/17/17 0452  Weight: 65.8 kg 69.2 kg    Examination:  General exam: Appears calm and comfortable  Respiratory system: Clear to auscultation. Respiratory effort normal. Cardiovascular system: S1 & S2 heard, RRR. No JVD, murmurs, rubs, gallops or clicks. No pedal edema. Gastrointestinal system: Abdomen is nondistended, soft and nontender. No organomegaly or masses felt. Normal bowel sounds heard. Central nervous system: Alert and oriented. No focal neurological deficits. Extremities: Right leg and knee immobilizer Skin: No rashes, lesions or ulcers Psychiatry: Judgement and insight appear normal. Mood & affect appropriate.     Data Reviewed: I have personally reviewed following labs and imaging studies  CBC: Recent Labs  Lab 12/17/17 0024  WBC 8.0  NEUTROABS 4.9  HGB 10.1*  HCT 33.5*  MCV 91.3  PLT 976   Basic Metabolic Panel: Recent Labs  Lab 12/17/17 0024  NA 138  K 3.8  CL 106  CO2 26  GLUCOSE 155*  BUN 15  CREATININE 0.61  CALCIUM 8.5*   GFR: Estimated Creatinine Clearance: 45.4 mL/min (by C-G formula based on SCr of 0.61 mg/dL). Liver Function Tests: No results for  input(s): AST, ALT, ALKPHOS, BILITOT, PROT, ALBUMIN in the last 168 hours. No results for input(s): LIPASE, AMYLASE in the last 168 hours. No results for input(s): AMMONIA in the last 168 hours. Coagulation Profile: No results for input(s): INR, PROTIME in the last 168 hours. Cardiac Enzymes: No results for input(s): CKTOTAL, CKMB, CKMBINDEX, TROPONINI in the last 168 hours. BNP (last 3 results) No results for input(s): PROBNP in the last  8760 hours. HbA1C: No results for input(s): HGBA1C in the last 72 hours. CBG: No results for input(s): GLUCAP in the last 168 hours. Lipid Profile: No results for input(s): CHOL, HDL, LDLCALC, TRIG, CHOLHDL, LDLDIRECT in the last 72 hours. Thyroid Function Tests: No results for input(s): TSH, T4TOTAL, FREET4, T3FREE, THYROIDAB in the last 72 hours. Anemia Panel: No results for input(s): VITAMINB12, FOLATE, FERRITIN, TIBC, IRON, RETICCTPCT in the last 72 hours. Sepsis Labs: No results for input(s): PROCALCITON, LATICACIDVEN in the last 168 hours.  Recent Results (from the past 240 hour(s))  Surgical pcr screen     Status: None   Collection Time: 12/17/17  7:03 AM  Result Value Ref Range Status   MRSA, PCR NEGATIVE NEGATIVE Final   Staphylococcus aureus NEGATIVE NEGATIVE Final    Comment: (NOTE) The Xpert SA Assay (FDA approved for NASAL specimens in patients 55 years of age and older), is one component of a comprehensive surveillance program. It is not intended to diagnose infection nor to guide or monitor treatment. Performed at [facility], [place] [address], [place], [place] [address]          Radiology Studies: Dg Hip Unilat W Or Wo Pelvis 1 View Right  Result Date: 12/17/2017 CLINICAL DATA:  Trip and fall walking to the bathroom tonight with right hip and leg pain. EXAM: DG HIP (WITH OR WITHOUT PELVIS) 1V RIGHT COMPARISON:  Pelvis radiograph 09/22/2017 FINDINGS: Unipolar right hip arthroplasty in expected alignment. No periprosthetic fracture. Femoral shaft fractures partially included. Remote left inferior pubic ramus fracture. Bones are diffusely under mineralized. Calcified pelvic lymph nodes again seen. IMPRESSION: Femoral shaft fracture partially included. Right hip arthroplasty is intact without periprosthetic involvement or additional acute fracture of the pelvis. Electronically Signed   By: Keith Rake M.D.   On: 12/17/2017 01:33   Dg Femur  Min 2 Views Right  Result Date: 12/17/2017 CLINICAL DATA:  Trip and fall walking to the bathroom tonight with right hip and leg pain. EXAM: RIGHT FEMUR 2 VIEWS COMPARISON:  Right hip radiograph 09/22/2017 FINDINGS: Right hip arthroplasty in expected alignment. No periprosthetic lucency. Comminuted fracture of the mid distal femoral shaft extending into the femoral metaphysis. There is intra-articular extension to the intercondylar notch, not well assessed on full field of view femur x-ray. Medial compartment osteoarthritis of the knee. The bones are diffusely under mineralized. IMPRESSION: Comminuted displaced mid distal femoral shaft fracture extending into the metaphysis. Nondisplaced extension into the knee joint in the intercondylar notch. Electronically Signed   By: Keith Rake M.D.   On: 12/17/2017 01:31        Scheduled Meds: . [START ON 12/18/2017] Influenza vac split quadrivalent PF  0.5 mL Intramuscular Tomorrow-1000  . latanoprost  1 drop Both Eyes QHS  . levothyroxine  88 mcg Oral Q0600  . timolol  1 drop Both Eyes BID   Continuous Infusions: . sodium chloride 125 mL/hr at 12/17/17 0655     LOS: 0 days    Time spent: 35 minutes      A ,  MD [facility] Pager [phone]  If 7PM-7AM, please contact night-coverage [email or web address] Password [email or web address] 12/17/2017, 11:09 AM

## 2017-12-17 NOTE — Progress Notes (Signed)
Patients daughter, [place], called back and is aware of patient being transported to [facility] and room number given to her as well.

## 2017-12-17 NOTE — ED Provider Notes (Signed)
[facility] DEPT Provider Note   CSN: [id number] Arrival date & time: 12/17/17  0003     History   Chief Complaint Chief Complaint  Patient presents with  . Fall    HPI EXA BOMBA is a 84 y.o. female.  HPI  This is an 84 year old female with a history of TIA, GI bleed, paroxysmal atrial fibrillation who presents with fall.  Patient reports right leg pain after falling.  She reports a mechanical fall after tripping over her slipper.  She denies hitting her head or loss of consciousness.  She is not currently on any blood thinners.  She is reporting 10 out of 10 pain in the right leg just above her knee.  She denies back pain, chest pain, shortness of breath, nausea, vomiting, headache.  She was given fentanyl in route with some relief.  Past Medical History:  Diagnosis Date  . Blood transfusion without reported diagnosis   . Chronic back pain   . Chronic constipation   . Dysrhythmia    Afib with RVR - 2015, Oaroxyomal Afib  . GI bleed   . Glaucoma   . Syncope 2015  . Thyroid disease   . TIA (transient ischemic attack) 2015    Patient Active Problem List   Diagnosis Date Noted  . Closed 4-part fracture of proximal humerus, right, initial encounter 10/09/2017  . Paroxysmal A-fib ([place]) 09/21/2017  . Closed displaced fracture of right femoral neck ([place]) 09/21/2017  . Closed fracture of right proximal humerus 09/21/2017  . Dehydration 09/21/2017  . Hyponatremia 09/21/2017  . Elevated CK 09/21/2017  . Thyroid nodule 09/21/2017  . Closed right hip fracture ([place]) 09/21/2017  . Chronic diastolic CHF (congestive heart failure) ([place]) 09/21/2017  . Sinus tachycardia 09/21/2017  . Acute lower UTI 09/21/2017  . TIA (transient ischemic attack) 08/25/2014  . Aphasia 08/25/2014  . Chronic constipation 05/19/2014  . Fracture of multiple pubic rami ([place]) 04/11/2014  . Pubic bone fracture ([place]) 04/11/2014  . Left hip pain 04/11/2014  .  Atrial fibrillation with RVR ([place]) 03/28/2013  . Diarrhea 03/28/2013  . Hypothyroidism 03/24/2013  . Syncope 03/24/2013  . Glaucoma 03/24/2013  . Hypokalemia 03/24/2013  . Acute gastroenteritis 03/24/2013    Past Surgical History:  Procedure Laterality Date  . CATARACT EXTRACTION, BILATERAL    . COLONOSCOPY    . HIP ARTHROPLASTY Right 09/22/2017   Procedure: ARTHROPLASTY BIPOLAR HIP (HEMIARTHROPLASTY);  Surgeon: [name], MD;  Location: [place];  Service: Orthopedics;  Laterality: Right;  . JOINT REPLACEMENT  11/20/2011   LTKR  . REVERSE SHOULDER ARTHROPLASTY Right 10/09/2017  . REVERSE SHOULDER ARTHROPLASTY Right 10/09/2017   Procedure: REVERSE SHOULDER ARTHROPLASTY;  Surgeon: [name], MD;  Location: [place];  Service: Orthopedics;  Laterality: Right;     OB History   None      Home Medications    Prior to Admission medications   Medication Sig Start Date End Date Taking? Authorizing Provider  latanoprost (XALATAN) 0.005 % ophthalmic solution Place 1 drop into both eyes at bedtime.   Yes [provider]  levothyroxine (SYNTHROID, LEVOTHROID) 88 MCG tablet Take 88 mcg by mouth daily. 08/27/14  Yes [provider]  timolol (TIMOPTIC) 0.5 % ophthalmic solution Place 1 drop into both eyes 2 (two) times daily.   Yes [provider]  atorvastatin (LIPITOR) 20 MG tablet Take 1 tablet (20 mg total) by mouth daily. Patient not taking: Reported on 12/17/2017 08/26/14   Doree Barthel  L, MD  celecoxib (CELEBREX) 100 MG capsule Take 1 capsule (100 mg total) by mouth daily. Patient not taking: Reported on 12/17/2017 10/10/17 10/10/18  [name], MD  clopidogrel (PLAVIX) 75 MG tablet Take 1 tablet (75 mg total) by mouth daily. Patient not taking: Reported on 09/21/2014 08/26/14   Delfina Redwood, MD  enoxaparin (LOVENOX) 40 MG/0.4ML injection Inject 0.4 mLs (40 mg total) into the skin daily. Patient not taking: Reported on 12/17/2017 09/24/17 09/24/18   [name], MD  oxyCODONE (OXY IR/ROXICODONE) 5 MG immediate release tablet Take 1 pills every 4-6 hrs as needed for pain Patient not taking: Reported on 12/17/2017 10/10/17   [name], MD  polyethylene glycol (MIRALAX / Floria Raveling) packet Take 17 g by mouth daily as needed for mild constipation. Patient not taking: Reported on 12/17/2017 09/24/17   Shelly Coss, MD    Family History Family History  Problem Relation Age of Onset  . Osteoporosis Mother   . Cancer - Colon Father   . Schizophrenia Son     Social History Social History   Tobacco Use  . Smoking status: Never Smoker  . Smokeless tobacco: Never Used  Substance Use Topics  . Alcohol use: No  . Drug use: No     Allergies   Aspirin; Epinephrine; Penicillins; and Codeine   Review of Systems Review of Systems  Constitutional: Negative for fever.  Respiratory: Negative for shortness of breath.   Cardiovascular: Negative for chest pain.  Gastrointestinal: Negative for abdominal pain.  Musculoskeletal:       Right leg pain  Skin: Negative for wound.  Neurological: Negative for syncope, weakness, numbness and headaches.  All other systems reviewed and are negative.    Physical Exam Updated Vital Signs BP 140/70   Pulse 79   Temp (!) 97.5 F (36.4 C) (Oral)   Resp (!) 24   Ht 1.6 m (5\' 3" )   Wt 65.8 kg   SpO2 100%   BMI 25.69 kg/m   Physical Exam  Constitutional: She is oriented to person, place, and time.  Elderly, nontoxic-appearing  HENT:  Head: Normocephalic and atraumatic.  Eyes: Pupils are equal, round, and reactive to light.  Neck: Normal range of motion. Neck supple.  No midline C-spine tenderness  Cardiovascular: Normal rate, regular rhythm and normal heart sounds.  Pulmonary/Chest: Effort normal and breath sounds normal. No respiratory distress. She has no wheezes.  Abdominal: Soft. Bowel sounds are normal. There is no tenderness.  Musculoskeletal:  Right lower extremity  foreshortened, she has tenderness to palpation over the mid thigh, no tenderness over the hip, limited range of motion of the knee and hip secondary to pain, 2+ DP pulse Contusion noted over the left knee with normal range of motion  Neurological: She is alert and oriented to person, place, and time.  Skin: Skin is warm and dry.  Psychiatric: She has a normal mood and affect.  Nursing note and vitals reviewed.    ED Treatments / Results  Labs (all labs ordered are listed, but only abnormal results are displayed) Labs Reviewed  CBC WITH DIFFERENTIAL/PLATELET - Abnormal; Notable for the following components:      Result Value   RBC 3.67 (*)    Hemoglobin 10.1 (*)    HCT 33.5 (*)    All other components within normal limits  BASIC METABOLIC PANEL - Abnormal; Notable for the following components:   Glucose, Bld 155 (*)    Calcium 8.5 (*)  All other components within normal limits    EKG None  Radiology Dg Hip Unilat W Or Wo Pelvis 1 View Right  Result Date: 12/17/2017 CLINICAL DATA:  Trip and fall walking to the bathroom tonight with right hip and leg pain. EXAM: DG HIP (WITH OR WITHOUT PELVIS) 1V RIGHT COMPARISON:  Pelvis radiograph 09/22/2017 FINDINGS: Unipolar right hip arthroplasty in expected alignment. No periprosthetic fracture. Femoral shaft fractures partially included. Remote left inferior pubic ramus fracture. Bones are diffusely under mineralized. Calcified pelvic lymph nodes again seen. IMPRESSION: Femoral shaft fracture partially included. Right hip arthroplasty is intact without periprosthetic involvement or additional acute fracture of the pelvis. Electronically Signed   By: Keith Rake M.D.   On: 12/17/2017 01:33   Dg Femur Min 2 Views Right  Result Date: 12/17/2017 CLINICAL DATA:  Trip and fall walking to the bathroom tonight with right hip and leg pain. EXAM: RIGHT FEMUR 2 VIEWS COMPARISON:  Right hip radiograph 09/22/2017 FINDINGS: Right hip arthroplasty in  expected alignment. No periprosthetic lucency. Comminuted fracture of the mid distal femoral shaft extending into the femoral metaphysis. There is intra-articular extension to the intercondylar notch, not well assessed on full field of view femur x-ray. Medial compartment osteoarthritis of the knee. The bones are diffusely under mineralized. IMPRESSION: Comminuted displaced mid distal femoral shaft fracture extending into the metaphysis. Nondisplaced extension into the knee joint in the intercondylar notch. Electronically Signed   By: Keith Rake M.D.   On: 12/17/2017 01:31    Procedures Procedures (including critical care time)  Medications Ordered in ED Medications  morphine 4 MG/ML injection 4 mg (4 mg Intravenous Given 12/17/17 0027)  ondansetron (ZOFRAN) injection 4 mg (4 mg Intravenous Given 12/17/17 0024)  HYDROmorphone (DILAUDID) injection 0.5 mg (0.5 mg Intravenous Given 12/17/17 0131)     Initial Impression / Assessment and Plan / ED Course  I have reviewed the triage vital signs and the nursing notes.  Pertinent labs & imaging results that were available during my care of the patient were reviewed by me and considered in my medical decision making (see chart for details).  Clinical Course as of Dec 17 216  Tue Dec 17, 2017  0122 X-rays reviewed by myself.  Patient has a distal femur fracture proximal to the knee with foreshortening.  She is followed primarily by [facility].   [CH]  [phone] Discussed with Dr. Marcelino Scot orthopedics.  We will plan for admission to the hospitalist.  Recommends n.p.o.  Agrees with Buck's traction.  Also requesting a well-padded knee immobilizer.   [CH]    Clinical Course User Index [CH] Horton, Barbette Hair, MD    Patient presents after reported mechanical fall.  She appears in pain.  Mostly over the right thigh.  Her leg is foreshortened but not internally rotated.  Suspicion for femur fracture.  No significant tenting noted.  She is  neurovascular intact distally.  She has no other evidence of trauma.  See clinical course above.  X-rays confirm a midshaft femur fracture extending into the right knee.  Patient was given multiple doses of pain medication.  She was discussed with orthopedics.  She will remain n.p.o.,  Knee immobilizer in Buck's traction ordered.  Plan for admission to the hospitalist.   Final Clinical Impressions(s) / ED Diagnoses   Final diagnoses:  Closed displaced comminuted fracture of shaft of right femur, initial encounter ([place])  Fall, initial encounter    ED Discharge Orders    None  Merryl Hacker, MD 12/17/17 [phone]

## 2017-12-18 ENCOUNTER — Encounter (HOSPITAL_COMMUNITY): Payer: Self-pay

## 2017-12-18 ENCOUNTER — Encounter (HOSPITAL_COMMUNITY)
Admission: EM | Disposition: A | Discharge status: Skilled Nursing Facility | Payer: Self-pay | Source: Home / Self Care | Attending: Family Medicine

## 2017-12-18 ENCOUNTER — Encounter (HOSPITAL_COMMUNITY): Payer: Self-pay | Admitting: Certified Registered Nurse Anesthetist

## 2017-12-18 DIAGNOSIS — E039 Hypothyroidism, unspecified: Secondary | ICD-10-CM

## 2017-12-18 DIAGNOSIS — I5032 Chronic diastolic (congestive) heart failure: Secondary | ICD-10-CM

## 2017-12-18 DIAGNOSIS — D62 Acute posthemorrhagic anemia: Secondary | ICD-10-CM

## 2017-12-18 DIAGNOSIS — I480 Paroxysmal atrial fibrillation: Secondary | ICD-10-CM

## 2017-12-18 DIAGNOSIS — E440 Moderate protein-calorie malnutrition: Secondary | ICD-10-CM | POA: Diagnosis present

## 2017-12-18 LAB — BASIC METABOLIC PANEL
Anion gap: 6 (ref 5–15)
BUN: 12 mg/dL (ref 8–23)
CALCIUM: 8.1 mg/dL — AB (ref 8.9–10.3)
CO2: 27 mmol/L (ref 22–32)
CREATININE: 0.57 mg/dL (ref 0.44–1.00)
Chloride: 99 mmol/L (ref 98–111)
GFR calc Af Amer: >60 mL/min (ref >60)
GFR calc non Af Amer: >60 mL/min (ref >60)
Glucose, Bld: 118 mg/dL — ABNORMAL HIGH (ref 70–99)
Potassium: 4.1 mmol/L (ref 3.5–5.1)
Sodium: 132 mmol/L — ABNORMAL LOW (ref 135–145)

## 2017-12-18 LAB — PROTIME-INR
INR: 1.09
Prothrombin Time: 14 seconds (ref 11.4–15.2)

## 2017-12-18 LAB — CBC
HEMATOCRIT: 24.1 % — AB (ref 36.0–46.0)
Hemoglobin: 7.5 g/dL — ABNORMAL LOW (ref 12.0–15.0)
MCH: 28 pg (ref 26.0–34.0)
MCHC: 31.1 g/dL (ref 30.0–36.0)
MCV: 89.9 fL (ref 80.0–100.0)
Platelets: 160 10*3/uL (ref 150–400)
RBC: 2.68 MIL/uL — ABNORMAL LOW (ref 3.87–5.11)
RDW: 14.6 % (ref 11.5–15.5)
WBC: 9.5 10*3/uL (ref 4.0–10.5)
nRBC: 0 % (ref 0.0–0.2)

## 2017-12-18 LAB — HEMOGLOBIN AND HEMATOCRIT, BLOOD
HEMATOCRIT: 31.1 % — AB (ref 36.0–46.0)
Hemoglobin: 10.1 g/dL — ABNORMAL LOW (ref 12.0–15.0)

## 2017-12-18 LAB — PREPARE RBC (CROSSMATCH)

## 2017-12-18 SURGERY — OPEN REDUCTION INTERNAL FIXATION (ORIF) DISTAL FEMUR FRACTURE
Anesthesia: Choice | Laterality: Right

## 2017-12-18 MED ORDER — ONDANSETRON HCL 4 MG/2ML IJ SOLN
INTRAMUSCULAR | Status: AC
  Filled 2017-12-18: qty 2

## 2017-12-18 MED ORDER — HYDROCODONE-ACETAMINOPHEN 5-325 MG PO TABS
1.0000 | ORAL_TABLET | Freq: Four times a day (QID) | ORAL | Status: DC | PRN
  Administered 2017-12-20: 1 via ORAL

## 2017-12-18 MED ORDER — METHOCARBAMOL 500 MG PO TABS
500.0000 mg | ORAL_TABLET | Freq: Three times a day (TID) | ORAL | Status: DC | PRN
  Administered 2017-12-20: 500 mg via ORAL

## 2017-12-18 MED ORDER — BOOST / RESOURCE BREEZE PO LIQD CUSTOM
1.0000 | ORAL | Status: DC
  Administered 2017-12-18 – 2017-12-23 (×6): 1 via ORAL

## 2017-12-18 MED ORDER — FENTANYL CITRATE (PF) 250 MCG/5ML IJ SOLN
INTRAMUSCULAR | Status: AC
  Filled 2017-12-18: qty 5

## 2017-12-18 MED ORDER — MAGNESIUM OXIDE 400 (241.3 MG) MG PO TABS
400.0000 mg | ORAL_TABLET | Freq: Two times a day (BID) | ORAL | Status: AC
  Administered 2017-12-18 – 2017-12-20 (×6): 400 mg via ORAL
  Filled 2017-12-18 (×6): qty 1

## 2017-12-18 MED ORDER — LIDOCAINE 2% (20 MG/ML) 5 ML SYRINGE
INTRAMUSCULAR | Status: AC
  Filled 2017-12-18: qty 5

## 2017-12-18 MED ORDER — FUROSEMIDE 10 MG/ML IJ SOLN
20.0000 mg | Freq: Once | INTRAMUSCULAR | Status: AC
  Administered 2017-12-18: 20 mg via INTRAVENOUS
  Filled 2017-12-18: qty 2

## 2017-12-18 MED ORDER — ACETAMINOPHEN 325 MG PO TABS: 650.0000 mg | ORAL_TABLET | Freq: Once | ORAL | Status: DC

## 2017-12-18 MED ORDER — DEXAMETHASONE SODIUM PHOSPHATE 10 MG/ML IJ SOLN
INTRAMUSCULAR | Status: AC
  Filled 2017-12-18: qty 1

## 2017-12-18 MED ORDER — SODIUM CHLORIDE 0.9% IV SOLUTION
Freq: Once | INTRAVENOUS | Status: AC
  Administered 2017-12-18: 14:00:00 via INTRAVENOUS

## 2017-12-18 MED ORDER — LACTATED RINGERS IV SOLN
INTRAVENOUS | Status: DC
  Administered 2017-12-18: 11:00:00 via INTRAVENOUS

## 2017-12-18 MED ORDER — ADULT MULTIVITAMIN W/MINERALS CH
1.0000 | ORAL_TABLET | Freq: Every day | ORAL | Status: DC
  Administered 2017-12-18 – 2017-12-24 (×7): 1 via ORAL
  Filled 2017-12-18 (×7): qty 1

## 2017-12-18 MED ORDER — ACETAMINOPHEN 325 MG PO TABS
650.0000 mg | ORAL_TABLET | Freq: Three times a day (TID) | ORAL | Status: DC
  Administered 2017-12-18 – 2017-12-21 (×9): 650 mg via ORAL
  Filled 2017-12-18 (×9): qty 2

## 2017-12-18 MED ORDER — ROCURONIUM BROMIDE 50 MG/5ML IV SOSY
PREFILLED_SYRINGE | INTRAVENOUS | Status: AC
  Filled 2017-12-18: qty 5

## 2017-12-18 MED ORDER — ENOXAPARIN SODIUM 40 MG/0.4ML ~~LOC~~ SOLN
40.0000 mg | SUBCUTANEOUS | Status: AC
  Administered 2017-12-18 – 2017-12-19 (×2): 40 mg via SUBCUTANEOUS
  Filled 2017-12-18 (×2): qty 0.4

## 2017-12-18 NOTE — Progress Notes (Addendum)
Orthopaedic Trauma Service  Patient seen and evaluated Full consult to follow Patient arrived to [facility] hospital around 2000-2030 yesterday evening.  Initial transfer request placed at 1100 am, I communicated need to transfer pt at 0940 yesterday as well   Patient seen and evaluated by the orthopedic trauma service today for right distal femur periprosthetic fracture (proximal hip hemiarthroplasty)  Patient patient is a very pleasant, no complaints whatsoever other than R leg pain   Patient noted to be tachycardic during her entire hospitalization thus far.  She did have an episode of paroxysmal SVT with HR of 140.  She reverted quickly back down to sinus tachycardia with a rate of 110. Patient has a history of paroxysmal atrial fibrillation and is on no rate control medication.  She is also not on any chronic anticoagulation.  Apparently there is a history of Plavix use but it was discontinued a while ago.  I have discussed with the medical service.  They would like to see the patient before she comes down for surgery.  Additional work-up per medical service.  Patient has had an echo in 2016 which shows an ejection fraction of 55 to 60%.  Grade 1 dysfunction.  Trivial aortic valve regurgitation  Preop labs also ordered.  Keep patient n.p.o. for now  Also attempted to communicate with patient's daughter however her voicemail mailbox is full unable to leave a message  Jari Pigg, PA-C [phone] (C) 12/18/2017, 9:23 AM  [facility] [place] [phone] (F)

## 2017-12-18 NOTE — Consult Note (Signed)
Orthopaedic Trauma Service (OTS) Consult   Patient ID: ASAL TEAS MRN: [id number] DOB/AGE: [birth year] 84 y.o.   Reason for Consult: Right distal femur fracture, periprosthetic with intra-articular extension.  Right hip hemiarthroplasty proximal to fracture site Referring Physician: [name], MD (EDP)   HPI: Stephanie Donovan is an 84 y.o. white female who sustained a ground-level fall and will late night 12/16/2017/early morning on 12/17/2017.  Patient was brought to [facility] and was found to have a comminuted right distal femur fracture with intra-articular extension.  Fracture is immediately below right hip hemiarthroplasty which was done September 2019 by Dr.Varkey.  Patient also had a right reverse shoulder arthroplasty performed in September 2019 by Dr. Griffin Basil as well.  Right hip hemiarthroplasty reverse shoulder arthroplasty or for fractures sustained in a fall in September as well.  Patient states that she was mobilizing around her home when she sustained a ground-level fall resulting in immediate onset of pain to her right knee.  She was unable to get up or bear weight.  Pain was located immediately around her knee.  She was brought to [facility] long hospital where her fracture was identified.  Orthopedic trauma service was on-call for Dr. Griffin Basil and we are contacted regarding her injury.  Patient was admitted to the medical service at [facility] long.  We did request immediate transfer to [facility] hospital for definitive treatment.  I directly communicated with the medical service at [place] on 12/17/2017 but patient did not arrive to [facility] hospital until about 2030 on 12/17/2017.  Patient seen and evaluated on the orthopedic floor today her only complaint is right knee pain.  She denies any chest pain or shortness of breath no nausea or vomiting.  No headaches, no lightheadedness.  She denies any numbness or tingling in her right lower extremity as well.  Patient  denies any additional issues and denies pain elsewhere   Patient lives with her daughter  She uses a cane at baseline to ambulate   She has a history of paroxysmal A. fib and TIA.  She is not on any coagulation chronically.  It appears to be a history of Plavix use but she has not been on this for quite a while.  She does also have a history of a GI bleed.   Past Medical History:  Diagnosis Date  . Blood transfusion without reported diagnosis   . Chronic back pain   . Chronic constipation   . Dysrhythmia    Afib with RVR - 2015, Oaroxyomal Afib  . GI bleed   . Glaucoma   . Syncope 2015  . Thyroid disease   . TIA (transient ischemic attack) 2015    Past Surgical History:  Procedure Laterality Date  . CATARACT EXTRACTION, BILATERAL    . COLONOSCOPY    . HIP ARTHROPLASTY Right 09/22/2017   Procedure: ARTHROPLASTY BIPOLAR HIP (HEMIARTHROPLASTY);  Surgeon: [name], MD;  Location: [place];  Service: Orthopedics;  Laterality: Right;  . JOINT REPLACEMENT  11/20/2011   LTKR  . REVERSE SHOULDER ARTHROPLASTY Right 10/09/2017  . REVERSE SHOULDER ARTHROPLASTY Right 10/09/2017   Procedure: REVERSE SHOULDER ARTHROPLASTY;  Surgeon: [name], MD;  Location: [place];  Service: Orthopedics;  Laterality: Right;    Family History  Problem Relation Age of Onset  . Osteoporosis Mother   . Cancer - Colon Father   . Schizophrenia Son     Social History:  reports that she has never smoked.  She has never used smokeless tobacco. She reports that she does not drink alcohol or use drugs.  Allergies:  Allergies  Allergen Reactions  . Aspirin Palpitations and Other (See Comments)    Unspecified Mixed reactions  . Penicillins Other (See Comments)    Lost hearing temporarily  . Codeine Palpitations    Medications: I have reviewed the patient's current medications. Current Meds  Medication Sig  . latanoprost (XALATAN) 0.005 % ophthalmic solution Place 1 drop into both eyes at bedtime.  Marland Kitchen  levothyroxine (SYNTHROID, LEVOTHROID) 88 MCG tablet Take 88 mcg by mouth daily.  . timolol (TIMOPTIC) 0.5 % ophthalmic solution Place 1 drop into both eyes 2 (two) times daily.     Results for orders placed or performed during the hospital encounter of 12/17/17 (from the past 48 hour(s))  CBC with Differential     Status: Abnormal   Collection Time: 12/17/17 12:24 AM  Result Value Ref Range   WBC 8.0 4.0 - 10.5 K/uL   RBC 3.67 (L) 3.87 - 5.11 MIL/uL   Hemoglobin 10.1 (L) 12.0 - 15.0 g/dL   HCT 33.5 (L) 36.0 - 46.0 %   MCV 91.3 80.0 - 100.0 fL   MCH 27.5 26.0 - 34.0 pg   MCHC 30.1 30.0 - 36.0 g/dL   RDW 14.4 11.5 - 15.5 %   Platelets 224 150 - 400 K/uL   nRBC 0.0 0.0 - 0.2 %   Neutrophils Relative % 60 %   Neutro Abs 4.9 1.7 - 7.7 K/uL   Lymphocytes Relative 28 %   Lymphs Abs 2.2 0.7 - 4.0 K/uL   Monocytes Relative 9 %   Monocytes Absolute 0.7 0.1 - 1.0 K/uL   Eosinophils Relative 2 %   Eosinophils Absolute 0.1 0.0 - 0.5 K/uL   Basophils Relative 1 %   Basophils Absolute 0.1 0.0 - 0.1 K/uL   Immature Granulocytes 0 %   Abs Immature Granulocytes 0.02 0.00 - 0.07 K/uL    Comment: Performed at [facility], [place] [address], [place], [place] [address]  Basic metabolic panel     Status: Abnormal   Collection Time: 12/17/17 12:24 AM  Result Value Ref Range   Sodium 138 135 - 145 mmol/L   Potassium 3.8 3.5 - 5.1 mmol/L   Chloride 106 98 - 111 mmol/L   CO2 26 22 - 32 mmol/L   Glucose, Bld 155 (H) 70 - 99 mg/dL   BUN 15 8 - 23 mg/dL   Creatinine, Ser 0.61 0.44 - 1.00 mg/dL   Calcium 8.5 (L) 8.9 - 10.3 mg/dL   GFR calc non Af Amer >60 >60 mL/min   GFR calc Af Amer >60 >60 mL/min    Comment: (NOTE) The eGFR has been calculated using the CKD EPI equation. This calculation has not been validated in all clinical situations. eGFR's persistently <60 mL/min signify possible Chronic Kidney Disease.    Anion gap 6 5 - 15    Comment: Performed at [facility], [place] [address] [place], [place] [address]  Surgical pcr screen     Status: None   Collection Time: 12/17/17  7:03 AM  Result Value Ref Range   MRSA, PCR NEGATIVE NEGATIVE   Staphylococcus aureus NEGATIVE NEGATIVE    Comment: (NOTE) The Xpert SA Assay (FDA approved for NASAL specimens in patients 57 years of age and older), is one component of a comprehensive surveillance program. It is not intended to diagnose infection nor to guide or  monitor treatment. Performed at [facility], [place] [address], [place], [place] [address]   Magnesium     Status: None   Collection Time: 12/17/17 11:25 AM  Result Value Ref Range   Magnesium 1.8 1.7 - 2.4 mg/dL    Comment: Performed at [facility], [place] [address] [place], [place] [address]    Dg Hip Unilat W Or Wo Pelvis 1 View Right  Result Date: 12/17/2017 CLINICAL DATA:  Trip and fall walking to the bathroom tonight with right hip and leg pain. EXAM: DG HIP (WITH OR WITHOUT PELVIS) 1V RIGHT COMPARISON:  Pelvis radiograph 09/22/2017 FINDINGS: Unipolar right hip arthroplasty in expected alignment. No periprosthetic fracture. Femoral shaft fractures partially included. Remote left inferior pubic ramus fracture. Bones are diffusely under mineralized. Calcified pelvic lymph nodes again seen. IMPRESSION: Femoral shaft fracture partially included. Right hip arthroplasty is intact without periprosthetic involvement or additional acute fracture of the pelvis. Electronically Signed   By:  Rake M.D.   On: 12/17/2017 01:33   Dg Femur Min 2 Views Right  Result Date: 12/17/2017 CLINICAL DATA:  Trip and fall walking to the bathroom tonight with right hip and leg pain. EXAM: RIGHT FEMUR 2 VIEWS COMPARISON:  Right hip radiograph 09/22/2017 FINDINGS: Right hip arthroplasty in expected alignment. No periprosthetic lucency. Comminuted fracture of the mid distal femoral shaft extending into the femoral  metaphysis. There is intra-articular extension to the intercondylar notch, not well assessed on full field of view femur x-ray. Medial compartment osteoarthritis of the knee. The bones are diffusely under mineralized. IMPRESSION: Comminuted displaced mid distal femoral shaft fracture extending into the metaphysis. Nondisplaced extension into the knee joint in the intercondylar notch. Electronically Signed   By:  Rake M.D.   On: 12/17/2017 01:31    Review of Systems  Constitutional: Negative for chills and fever.  Respiratory: Negative for shortness of breath and wheezing.   Cardiovascular: Negative for chest pain and palpitations.  Gastrointestinal: Negative for nausea and vomiting.  Neurological: Negative for tingling and sensory change.   Blood pressure 119/60, pulse (!) 108, temperature 98.6 F (37 C), temperature source Oral, resp. rate 18, height _0  (1.6 m), weight 69.2 kg, SpO2 98 %. Physical Exam  Constitutional: She is cooperative.  Very pleasant Communicates clearly   Cardiovascular: Regular rhythm. Tachycardia present.  s1 and s2  Pulmonary/Chest: No accessory muscle usage. No respiratory distress.  Clear anterior fields   Abdominal: Soft. There is no tenderness. There is no guarding.  + BS  Musculoskeletal:  Pelvis      no traumatic wounds or rash, no ecchymosis, stable to manual stress, nontender  Right Lower Extremity  Inspection: healed surgical wound right hip with Right leg is in a knee immobilizer as well as a bulky compressive dressing in place  Right leg is shortened and externally rotated through the  Bony eval: Hip is nontender Thigh and knee are tender Gross motion and crepitus noted with manipulation of R leg  Lower leg, ankle and foot are nontender  Soft tissue: Soft tissue around hip is unremarkable No open wounds or traumatic wound Did not remove compressive wrap  Sensation: DPN, SPN, TN sensory functions are grossly  intact  Motor: EHL, FHL, lesser toe motor functions are grossly intact Ankle flexion extension inversion eversion are intact  Vascular: + DP pulse Ext warm  Compartments are soft and nontender   Left Lower Extremity  no open wounds or lesions, no swelling or ecchymosis. Healed TKR wound   Nontender hip, knee, ankle and foot             No crepitus or gross motion noted with manipulation of the left leg  No knee or ankle effusion             No pain with axial loading or logrolling of the hip.   Knee stable to varus/ valgus and anterior/posterior stress             No pain with manipulation of the ankle or foot             No blocks to motion noted  Sens DPN, SPN, TN intact  Motor EHL, FHL, lesser toe motor, Ext, flex, evers 5/5  DP 2+, PT 2+, No significant edema             Compartments are soft and nontender, no pain with passive stretching  Bilateral upper extremities  shoulder, elbow, wrist, digits- no acute skin wounds, nontender, no instability, no blocks to motion             + senile purpura   Sens  Ax/R/M/U intact  Mot   Ax/ R/ PIN/ M/ AIN/ U intact  Rad 2+      Neurological: She is alert.  Psychiatric: She has a normal mood and affect. Her speech is normal. Thought content normal.     Assessment/Plan:  84 year old white female with a ground-level fall with acuteRight intra-articular distal femur fracture, periprosthetic.  -fall   -Right hip periprosthetic distal femur fracture, stable hip hemiarthroplasty  Patient will need surgical intervention to address her fracture.  We will plan on definitive treatment with plate osteosynthesis with cable augmentation    We will proceed as soon as patient is cleared by the medical service.  Appears to have been in some paroxysmal SVT earlier this morning.  She has been tachycardic during her entire hospitalization thus far, albeit mild sinus tachycardia.  Directly communicated with medical service and they  would like to evaluate before allowing her to go down to the operating room.  Patient may need additional work-up given her history of paroxysmal A. fib, TIA and the fact that she has not been on anticoagulation for quite a while.   Patient is nonweightbearing and will be nonweightbearing for 8 weeks postoperatively.  She will have unrestricted range of motion postoperatively.  PT and OT evaluation   Ice and elevate right leg   Dietitian consultation  - Pain management:  Titrate accordingly   - ABL anemia/Hemodynamics  Labs pending   - Medical issues   Per primary team   - DVT/PE prophylaxis:  Will recommend Lovenox for 30 days post op   - ID:   periop abx  - Metabolic Bone Disease:  Patient would appear to osteoporosis given her numerous low energy fractures.  Check basic metabolic bone during hospitalization  Recommend DEXA outpatient   The most recent bone density scan I see in our system is from 10 years ago, May 2019 which is notable for a T score of -2.1 at her left hip which is within the osteopenia range.  Her lumbar spine T score was -1.7 which is also within the osteopenia range  Vitamin D level September 22, 2017 were excellent at 47 ng/mL   Continue with vitamin D and calcium supplementation    - Activity:  Nonweightbearing right leg  Patient can get to the  bedside chair with assist if she so desires before surgery  - FEN/GI prophylaxis/Foley/Lines:  Keep patient n.p.o. for now  IVF  - Impediments to fracture healing:  Osteoporosis/poor bone density   ? Poor nutritional status   - Dispo:  Hopeful for OR later today await medical clearance     Jari Pigg, PA-C [phone] (C) 12/18/2017, 8:44 AM  [facility] [place] [phone] Domingo Sep (F)

## 2017-12-18 NOTE — Progress Notes (Addendum)
Initial Nutrition Assessment  DOCUMENTATION CODES:   Non-severe (moderate) malnutrition in context of chronic illness  INTERVENTION:   MVI Boost Breeze once daily, each supplement provides 250 calories and 9 grams of protein.  Magic Cup once daily, each supplement provides 290 calories and 9 grams of protein.   NUTRITION DIAGNOSIS:   Moderate Malnutrition related to chronic illness( CHF) as evidenced by mild fat depletion, mild muscle depletion, edema.   GOAL:   Patient will meet greater than or equal to 90% of their needs   MONITOR:   PO intake, Supplement acceptance, Diet advancement  REASON FOR ASSESSMENT:   Consult Hip fracture protocol  ASSESSMENT:   Pt with PMH TIA,CHF, GI bleed, A fib, HLD chronic constipation. Admitted to ED with fall, right femur fracture.   Surgery for femur fracture rescheduled for 11/29 d/t anemia. Blood transfusion today.  Pt alert and oriented when I entered the room. Very soft spoken.   Pt reports a good appetite at home where she lives alone and cooks for herself. Pt reports that past few weeks she has been living with her daughter who has been cooking for her. Pt reports eating 3 meals a day starting with cereal with milk and a banana. A small sandwich for lunch and usually a bowl of soup for dinner. Pt doesn't snack throughout the day. No chewing or swallowing issues despite poor dentition.   Pt reports UBW around 145# and hasn't noticed any significant weight gains or losses in the past few weeks, but some flucutation. Could be related to fluid retention caused by CHF. Per weight trends, pt has gained 7.5# since last reading on 9/18. Current weight in chart is 152# with buck's traction, unsure of actual weight.  Pt ambulates with a cane.   Pt reported she doesn't like Ensures/nutrition supplements and reports she wouldn't consume them if brought to her.   Medications Reviewed and Include: Lasix 20 mg/d Magnesium Oxide 400 mcg  BID  Labs Reviewed: Sodium 132 (L) Calcium 8.1 (L) Glucose 118 (H)   NUTRITION - FOCUSED PHYSICAL EXAM:    Most Recent Value  Orbital Region  No depletion  Upper Arm Region  Mild depletion [previous shoulder surgery on RUE]  Thoracic and Lumbar Region  No depletion  Buccal Region  Mild depletion  Temple Region  No depletion  Clavicle Bone Region  Mild depletion  Clavicle and Acromion Bone Region  No depletion  Scapular Bone Region  No depletion  Dorsal Hand  Mild depletion  Patellar Region  No depletion  Anterior Thigh Region  No depletion  Posterior Calf Region  Mild depletion  Edema (RD Assessment)  Mild [some swelling on the LLE]  Hair  Reviewed  Eyes  Reviewed  Mouth  Reviewed [poor dentition]  Skin  Reviewed [ecchymosis LUE]  Nails  Reviewed       Diet Order:   Diet Order            Diet regular Room service appropriate? Yes; Fluid consistency: Thin  Diet effective now              EDUCATION NEEDS:   No education needs have been identified at this time  Skin:  Skin Assessment: Reviewed RN Assessment  Last BM:  11/25  Height:   Ht Readings from Last 1 Encounters:  12/17/17 5\' 3"  (1.6 m)    Weight:   Wt Readings from Last 1 Encounters:  12/17/17 69.2 kg    Ideal Body Weight:  52.27 kg  BMI:  Body mass index is 27.02 kg/m.  Estimated Nutritional Needs:   Kcal:  1400-1600  Protein:  85-95 g  Fluid:  >1.5 L    Mauricia Area, MS, Dietetic Intern Pager: [phone] After hours Pager: [phone]

## 2017-12-18 NOTE — Progress Notes (Addendum)
PROGRESS NOTE  ALJEAN HORIUCHI HQP:[id number] DOB: 03/03/1927 DOA: 12/17/2017 PCP: [facility]  Brief Narrative: 84 year old woman PMH hypothyroidism TIA, GI bleed, paroxysmal atrial fibrillation, diastolic CHF presented after mechanical fall resulting in right hip pain.  Imaging revealed comminuted displaced mid distal femoral fracture.  Assessment/Plan Femur periprosthetic fracture secondary to mechanical fall --Management per orthopedics --Plan is for operative fixation 11/29  ABLA secondary to fracture. --Transfuse 2 units PRBC. --CBC in a.m.  Paroxysmal atrial fibrillation, paroxysmal SVT.  Not currently on anticoagulation. See 10/3017 office note Dr. Doreene Nest.  Documented not being on Plavix.  Aspirin suggested and patient accepted.  Patient refused other anticoagulation. See also office note 07/12/17: "She is asymptomatic. We review her history of paroxysmal atrial fibrillation as well as CVA and resolved GI bleed. She continues to decline anticoagulation". Echocardiogram August 2016: LVEF 55-60%.  Normal wall motion.  No regional wall motion abnormalities.  Grade 1 diastolic dysfunction.  CHA2DS2-VASc 6. --Secondary to pain.  Asymptomatic.  Magnesium and potassium stable.  Will supplement magnesium. --Given acute blood loss anemia, hold on prophylactic Lovenox.  Agree with orthopedics, prophylactic Lovenox after surgery.  Patient declines long-term blood thinners. --Consider beta-blocker if recurs.  PMH TIA 2015, GI bleed 2018, paroxysmal atrial fibrillation 2015  Hypothyroidism --Continue levothyroxine  Chronic diastolic CHF --Appears well compensated.  Not on a diuretic.  PMH TIA. --Patient refused Plavix but did agree to aspirin at last office visit.  Glaucoma --Continue Xalatan, timolol   DVT prophylaxis: SCDs Code Status: Full Family Communication: none Disposition Plan: pending PT eval post-surgery    Murray Hodgkins,  MD  [facility] Direct contact: [phone] --Via [place]  --[email or web address]; password [email or web address]  7PM-7AM contact night coverage as above 12/18/2017, 11:30 AM  LOS: 1 day   Consultants:  Orthopedics   Procedures:    Antimicrobials:    Interval history/Subjective: No chest pain or shortness of breath.  No complaints other than some right leg pain.  Orthopedics contacted me earlier today, requested evaluation as patient had a brief episode of paroxysmal SVT.  Objective: Vitals:  Vitals:   12/17/17 2125 12/18/17 0503  BP: 103/69 119/60  Pulse: 89 (!) 108  Resp: 20 18  Temp: 99 F (37.2 C) 98.6 F (37 C)  SpO2: 91% 98%    Exam:  Constitutional:  . Appears calm and comfortable ENMT:  . grossly normal hearing  Respiratory:  . CTA bilaterally, no w/r/r.  . Respiratory effort normal.  Cardiovascular:  . RRR, no m/r/g . No LE extremity edema   . Telemetry brief SVT versus short burst of PAF.  Currently in sinus rhythm. Musculoskeletal:  . RUE, LUE, RLE, LLE   . Moves all extremities to command Psychiatric:  . Mental status o Mood, affect appropriate  I have personally reviewed the following:   Data: . BMP unremarkable . Hemoglobin 7.5, remainder CBC unremarkable . EKG independently reviewed showed sinus tach, PVC.  Scheduled Meds: . sodium chloride   Intravenous Once  . acetaminophen  650 mg Oral Q8H  . furosemide  20 mg Intravenous Once  . Influenza vac split quadrivalent PF  0.5 mL Intramuscular Tomorrow-1000  . latanoprost  1 drop Both Eyes QHS  . levothyroxine  88 mcg Oral Q0600  . magnesium oxide  400 mg Oral BID  . timolol  1 drop Both Eyes BID   Continuous Infusions:   Principal Problem:   Femur fracture (HCC) Active Problems:   Hypothyroidism   TIA (transient  ischemic attack)   Paroxysmal A-fib (HCC)   Chronic diastolic CHF (congestive heart failure) (HCC)   Fall   HLD (hyperlipidemia)   Chronic anemia   LOS: 1 day     Time spent 35 minutes, greater than 50% in discussion with patient, coordination of care with orthopedics.

## 2017-12-18 NOTE — Progress Notes (Signed)
Orthopaedic Trauma Service Progress Note  Follow up am labs show H/H of 7.5 and 24.1 Not surprised by this given pts injury  Tachycardia likely related to hypovolemia/ABL anemia  Will give 2 units or PRBCs  Jari Pigg, PA-C [phone] (C) 12/18/2017, 10:04 AM  [facility] [place] [address] [phone] Stephanie Donovan (F)

## 2017-12-18 NOTE — Plan of Care (Signed)
Problem: Education: Goal: Knowledge of General Education information will improve Description Including pain rating scale, medication(s)/side effects and non-pharmacologic comfort measures Outcome: Progressing   Problem: Health Behavior/Discharge Planning: Goal: Ability to manage health-related needs will improve Outcome: Progressing   Problem: Clinical Measurements: Goal: Diagnostic test results will improve Outcome: Progressing   Problem: Coping: Goal: Level of anxiety will decrease Outcome: Progressing   Problem: Elimination: Goal: Will not experience complications related to urinary retention Outcome: Progressing   Problem: Safety: Goal: Ability to remain free from injury will improve Outcome: Progressing   Problem: Skin Integrity: Goal: Risk for impaired skin integrity will decrease Outcome: Progressing

## 2017-12-18 NOTE — Progress Notes (Signed)
Patient ID: Stephanie Donovan, female   DOB: 13-Aug-1927, 84 y.o.   MRN: [id number] I spoke with the patient at the bedside.  She understands that she can not have surgery today due to her anemia and need for medical optimization.  She will be placed on the schedule for this Friday to stabilize her right distal femur fracture.    Getting blood today.

## 2017-12-19 DIAGNOSIS — D62 Acute posthemorrhagic anemia: Secondary | ICD-10-CM | POA: Diagnosis present

## 2017-12-19 LAB — CBC
HCT: 30.9 % — ABNORMAL LOW (ref 36.0–46.0)
Hemoglobin: 9.8 g/dL — ABNORMAL LOW (ref 12.0–15.0)
MCH: 27.8 pg (ref 26.0–34.0)
MCHC: 31.7 g/dL (ref 30.0–36.0)
MCV: 87.8 fL (ref 80.0–100.0)
PLATELETS: 167 10*3/uL (ref 150–400)
RBC: 3.52 MIL/uL — AB (ref 3.87–5.11)
RDW: 14.2 % (ref 11.5–15.5)
WBC: 10.8 10*3/uL — ABNORMAL HIGH (ref 4.0–10.5)
nRBC: 0 % (ref 0.0–0.2)

## 2017-12-19 NOTE — Progress Notes (Signed)
Patient ID: [name], female   DOB: [birth year], 84 y.o.   MRN: [id number] Patient is alert and oriented this morning without complaints.  Patient is scheduled for surgery for the femur fracture on Friday.

## 2017-12-19 NOTE — Progress Notes (Addendum)
  PROGRESS NOTE  Stephanie Donovan KZL:[id number] DOB: Jul 25, 1927 DOA: 12/17/2017 PCP: [facility]  Brief Narrative: 84 year old woman PMH hypothyroidism TIA, GI bleed, paroxysmal atrial fibrillation, diastolic CHF presented after mechanical fall resulting in right hip pain.  Imaging revealed comminuted displaced mid distal femoral fracture.  Assessment/Plan Femur periprosthetic fracture secondary to mechanical fall --Appears stable, pain controlled.  Plan for operative fixation 11/29  ABLA secondary to fracture.  Hemoglobin improved status post transfusion 2 units PRBC --Check CBC in a.m.  Paroxysmal atrial fibrillation, paroxysmal SVT.  Not currently on anticoagulation. See 10/3017 office note Dr. Doreene Nest.  Documented not being on Plavix.  Aspirin suggested and patient accepted.  Patient refused other anticoagulation. See also office note 07/12/17: "She is asymptomatic. We review her history of paroxysmal atrial fibrillation as well as CVA and resolved GI bleed. She continues to decline anticoagulation". Echocardiogram August 2016: LVEF 55-60%.  Normal wall motion.  No regional wall motion abnormalities.  Grade 1 diastolic dysfunction.  CHA2DS2-VASc 6. --Appears stable.  PMH TIA 2015, GI bleed 2018, paroxysmal atrial fibrillation 2015  Hypothyroidism --Continue levothyroxine  Chronic diastolic CHF --Appears well compensated.  PMH TIA. --Patient refused Plavix but did agree to aspirin at last office visit.  Glaucoma --Continue Xalatan, timolol   DVT prophylaxis: SCDs Code Status: Full Family Communication: none Disposition Plan: pending PT eval post-surgery    Murray Hodgkins, MD  [facility] Direct contact: [phone] --[place]ia [place]  --[email or web address]; password [email or web address]  7PM-7AM contact night coverage as above 12/19/2017, 1:15 PM  LOS: 2 days   Consultants:  Orthopedics   Procedures:  Transfusion 2 units PRBC  11/27  Antimicrobials:    Interval history/Subjective: Feels fine, no complaints.  Objective: Vitals:  Vitals:   12/18/17 1941 12/19/17 0425  BP: 114/61 117/68  Pulse: 97 94  Resp: 17 13  Temp: 99.3 F (37.4 C) 98.3 F (36.8 C)  SpO2: 95% 95%    Exam:  Constitutional:   . Appears calm and comfortable.  Eating lunch. Respiratory:  . CTA bilaterally, no w/r/r.  . Respiratory effort normal.  Cardiovascular:  . RRR, no m/r/g . No lower foot edema noted. Musculoskeletal:  . RLE, LLE   . Moves bilateral toes to command. Skin:  . Skin of the right toes appears well perfused. Psychiatric:  . Mental status o Mood, affect appropriate  I have personally reviewed the following:   Data: . Hemoglobin improved status post transfusion.  9.8 on most recent check.  Scheduled Meds: . acetaminophen  650 mg Oral Q8H  . feeding supplement  1 Container Oral Q24H  . Influenza vac split quadrivalent PF  0.5 mL Intramuscular Tomorrow-1000  . latanoprost  1 drop Both Eyes QHS  . levothyroxine  88 mcg Oral Q0600  . magnesium oxide  400 mg Oral BID  . multivitamin with minerals  1 tablet Oral Daily  . timolol  1 drop Both Eyes BID   Continuous Infusions:   Principal Problem:   Femur fracture (HCC) Active Problems:   Hypothyroidism   Paroxysmal A-fib (HCC)   Chronic diastolic CHF (congestive heart failure) (HCC)   Fall   HLD (hyperlipidemia)   Chronic anemia   Malnutrition of moderate degree   Acute blood loss anemia   LOS: 2 days

## 2017-12-19 NOTE — Plan of Care (Signed)
  Problem: Education: Goal: Knowledge of General Education information will improve Description Including pain rating scale, medication(s)/side effects and non-pharmacologic comfort measures Outcome: Progressing   Problem: Health Behavior/Discharge Planning: Goal: Ability to manage health-related needs will improve Outcome: Progressing   Problem: Clinical Measurements: Goal: Ability to maintain clinical measurements within normal limits will improve Outcome: Progressing Goal: Will remain free from infection Outcome: Progressing Goal: Respiratory complications will improve Outcome: Progressing Goal: Cardiovascular complication will be avoided Outcome: Progressing   Problem: Pain Managment: Goal: General experience of comfort will improve Outcome: Progressing

## 2017-12-19 NOTE — Progress Notes (Signed)
Patient ID: Stephanie Donovan, female   DOB: 06-10-1927, 84 y.o.   MRN: [id number] The patient's hgb/hct did improve with a transfusion.  I have spoken to her in length at the bedside.  The plan is to proceed to surgery tomorrow for surgical fixation of her right distal femur fracture.

## 2017-12-20 ENCOUNTER — Inpatient Hospital Stay (HOSPITAL_COMMUNITY): Payer: Medicare Other

## 2017-12-20 ENCOUNTER — Encounter (HOSPITAL_COMMUNITY): Payer: Self-pay | Admitting: Anesthesiology

## 2017-12-20 ENCOUNTER — Encounter (HOSPITAL_COMMUNITY)
Admission: EM | Disposition: A | Discharge status: Skilled Nursing Facility | Payer: Self-pay | Source: Home / Self Care | Attending: Family Medicine

## 2017-12-20 ENCOUNTER — Inpatient Hospital Stay (HOSPITAL_COMMUNITY): Payer: Medicare Other | Admitting: Anesthesiology

## 2017-12-20 DIAGNOSIS — S72351A Displaced comminuted fracture of shaft of right femur, initial encounter for closed fracture: Secondary | ICD-10-CM

## 2017-12-20 HISTORY — PX: ORIF FEMUR FRACTURE: SHX2119

## 2017-12-20 LAB — CBC
HCT: 32.4 % — ABNORMAL LOW (ref 36.0–46.0)
HEMATOCRIT: 30.4 % — AB (ref 36.0–46.0)
HEMOGLOBIN: 10.5 g/dL — AB (ref 12.0–15.0)
Hemoglobin: 9.6 g/dL — ABNORMAL LOW (ref 12.0–15.0)
MCH: 27.7 pg (ref 26.0–34.0)
MCH: 27.9 pg (ref 26.0–34.0)
MCHC: 31.6 g/dL (ref 30.0–36.0)
MCHC: 32.4 g/dL (ref 30.0–36.0)
MCV: 87.6 fL (ref 80.0–100.0)
MCV: 85.9 fL (ref 80.0–100.0)
NRBC: 0 % (ref 0.0–0.2)
PLATELETS: 189 10*3/uL (ref 150–400)
Platelets: 190 10*3/uL (ref 150–400)
RBC: 3.47 MIL/uL — ABNORMAL LOW (ref 3.87–5.11)
RBC: 3.77 MIL/uL — AB (ref 3.87–5.11)
RDW: 14.2 % (ref 11.5–15.5)
RDW: 15.4 % (ref 11.5–15.5)
WBC: 9 10*3/uL (ref 4.0–10.5)
WBC: 16.6 10*3/uL — ABNORMAL HIGH (ref 4.0–10.5)
nRBC: 0 % (ref 0.0–0.2)

## 2017-12-20 LAB — URINALYSIS, ROUTINE W REFLEX MICROSCOPIC
Bilirubin Urine: NEGATIVE
Glucose, UA: NEGATIVE mg/dL
Hgb urine dipstick: NEGATIVE
Ketones, ur: NEGATIVE mg/dL
Leukocytes, UA: NEGATIVE
Nitrite: NEGATIVE
Protein, ur: NEGATIVE mg/dL
Specific Gravity, Urine: 1.02 (ref 1.005–1.030)
pH: 6 (ref 5.0–8.0)

## 2017-12-20 SURGERY — OPEN REDUCTION INTERNAL FIXATION (ORIF) DISTAL FEMUR FRACTURE
Anesthesia: General | Site: Leg Upper | Laterality: Right

## 2017-12-20 MED ORDER — ONDANSETRON HCL 4 MG/2ML IJ SOLN
INTRAMUSCULAR | Status: DC | PRN
  Administered 2017-12-20: 4 mg via INTRAVENOUS

## 2017-12-20 MED ORDER — ACETAMINOPHEN 325 MG PO TABS: 325.0000 mg | ORAL_TABLET | Freq: Four times a day (QID) | ORAL | Status: DC | PRN

## 2017-12-20 MED ORDER — DEXAMETHASONE SODIUM PHOSPHATE 10 MG/ML IJ SOLN
INTRAMUSCULAR | Status: AC
  Filled 2017-12-20: qty 1

## 2017-12-20 MED ORDER — EPHEDRINE 5 MG/ML INJ
INTRAVENOUS | Status: AC
  Filled 2017-12-20: qty 10

## 2017-12-20 MED ORDER — FENTANYL CITRATE (PF) 100 MCG/2ML IJ SOLN
INTRAMUSCULAR | Status: DC | PRN
  Administered 2017-12-20: 25 ug via INTRAVENOUS
  Administered 2017-12-20: 50 ug via INTRAVENOUS
  Administered 2017-12-20: 25 ug via INTRAVENOUS
  Administered 2017-12-20: 100 ug via INTRAVENOUS

## 2017-12-20 MED ORDER — ALUM & MAG HYDROXIDE-SIMETH 200-200-20 MG/5ML PO SUSP: 30.0000 mL | ORAL | Status: DC | PRN

## 2017-12-20 MED ORDER — ALBUMIN HUMAN 5 % IV SOLN
INTRAVENOUS | Status: DC | PRN
  Administered 2017-12-20: 12:00:00 via INTRAVENOUS

## 2017-12-20 MED ORDER — FENTANYL CITRATE (PF) 250 MCG/5ML IJ SOLN
INTRAMUSCULAR | Status: AC
  Filled 2017-12-20: qty 5

## 2017-12-20 MED ORDER — METOCLOPRAMIDE HCL 5 MG PO TABS: 5.0000 mg | ORAL_TABLET | Freq: Three times a day (TID) | ORAL | Status: DC | PRN

## 2017-12-20 MED ORDER — SUGAMMADEX SODIUM 200 MG/2ML IV SOLN
INTRAVENOUS | Status: DC | PRN
  Administered 2017-12-20: 125 mg via INTRAVENOUS

## 2017-12-20 MED ORDER — MENTHOL 3 MG MT LOZG: 1.0000 | LOZENGE | OROMUCOSAL | Status: DC | PRN

## 2017-12-20 MED ORDER — HYDROCODONE-ACETAMINOPHEN 5-325 MG PO TABS
1.0000 | ORAL_TABLET | ORAL | Status: DC | PRN
  Administered 2017-12-23: 2 via ORAL
  Filled 2017-12-20: qty 2
  Filled 2017-12-20: qty 1

## 2017-12-20 MED ORDER — PROPOFOL 10 MG/ML IV BOLUS
INTRAVENOUS | Status: DC | PRN
  Administered 2017-12-20: 110 mg via INTRAVENOUS

## 2017-12-20 MED ORDER — HYDROCODONE-ACETAMINOPHEN 5-325 MG PO TABS
ORAL_TABLET | ORAL | Status: AC
  Administered 2017-12-20: 1 via ORAL
  Filled 2017-12-20: qty 1

## 2017-12-20 MED ORDER — HYDROCODONE-ACETAMINOPHEN 7.5-325 MG PO TABS
1.0000 | ORAL_TABLET | ORAL | Status: DC | PRN
  Administered 2017-12-22: 1 via ORAL

## 2017-12-20 MED ORDER — PHENYLEPHRINE 40 MCG/ML (10ML) SYRINGE FOR IV PUSH (FOR BLOOD PRESSURE SUPPORT)
PREFILLED_SYRINGE | INTRAVENOUS | Status: DC | PRN
  Administered 2017-12-20: 120 ug via INTRAVENOUS
  Administered 2017-12-20: 80 ug via INTRAVENOUS

## 2017-12-20 MED ORDER — MORPHINE SULFATE (PF) 2 MG/ML IV SOLN: 0.5000 mg | INTRAVENOUS | Status: DC | PRN

## 2017-12-20 MED ORDER — FENTANYL CITRATE (PF) 100 MCG/2ML IJ SOLN
INTRAMUSCULAR | Status: AC
  Administered 2017-12-20: 50 ug via INTRAVENOUS
  Filled 2017-12-20: qty 2

## 2017-12-20 MED ORDER — SODIUM CHLORIDE 0.9 % IV SOLN
INTRAVENOUS | Status: DC | PRN
  Administered 2017-12-20: 13:00:00 via INTRAVENOUS

## 2017-12-20 MED ORDER — CLINDAMYCIN PHOSPHATE 900 MG/50ML IV SOLN
900.0000 mg | Freq: Once | INTRAVENOUS | Status: AC
  Administered 2017-12-20: 900 mg via INTRAVENOUS

## 2017-12-20 MED ORDER — LACTATED RINGERS IV SOLN
INTRAVENOUS | Status: DC
  Administered 2017-12-20 (×2): via INTRAVENOUS

## 2017-12-20 MED ORDER — 0.9 % SODIUM CHLORIDE (POUR BTL) OPTIME
TOPICAL | Status: DC | PRN
  Administered 2017-12-20: 1000 mL

## 2017-12-20 MED ORDER — EPHEDRINE SULFATE-NACL 50-0.9 MG/10ML-% IV SOSY
PREFILLED_SYRINGE | INTRAVENOUS | Status: DC | PRN
  Administered 2017-12-20: 10 mg via INTRAVENOUS

## 2017-12-20 MED ORDER — LIDOCAINE 2% (20 MG/ML) 5 ML SYRINGE
INTRAMUSCULAR | Status: DC | PRN
  Administered 2017-12-20: 80 mg via INTRAVENOUS

## 2017-12-20 MED ORDER — ROCURONIUM BROMIDE 10 MG/ML (PF) SYRINGE
PREFILLED_SYRINGE | INTRAVENOUS | Status: DC | PRN
  Administered 2017-12-20: 50 mg via INTRAVENOUS

## 2017-12-20 MED ORDER — ONDANSETRON HCL 4 MG PO TABS: 4.0000 mg | ORAL_TABLET | Freq: Four times a day (QID) | ORAL | Status: DC | PRN

## 2017-12-20 MED ORDER — DOCUSATE SODIUM 100 MG PO CAPS
100.0000 mg | ORAL_CAPSULE | Freq: Two times a day (BID) | ORAL | Status: DC
  Administered 2017-12-20 – 2017-12-24 (×8): 100 mg via ORAL
  Filled 2017-12-20 (×8): qty 1

## 2017-12-20 MED ORDER — DEXAMETHASONE SODIUM PHOSPHATE 10 MG/ML IJ SOLN
INTRAMUSCULAR | Status: DC | PRN
  Administered 2017-12-20: 5 mg via INTRAVENOUS

## 2017-12-20 MED ORDER — ONDANSETRON HCL 4 MG/2ML IJ SOLN: 4.0000 mg | Freq: Once | INTRAMUSCULAR | Status: DC | PRN

## 2017-12-20 MED ORDER — ROCURONIUM BROMIDE 50 MG/5ML IV SOSY
PREFILLED_SYRINGE | INTRAVENOUS | Status: AC
  Filled 2017-12-20: qty 5

## 2017-12-20 MED ORDER — METHOCARBAMOL 500 MG PO TABS
ORAL_TABLET | ORAL | Status: AC
  Administered 2017-12-20: 500 mg via ORAL
  Filled 2017-12-20: qty 1

## 2017-12-20 MED ORDER — PHENYLEPHRINE 40 MCG/ML (10ML) SYRINGE FOR IV PUSH (FOR BLOOD PRESSURE SUPPORT)
PREFILLED_SYRINGE | INTRAVENOUS | Status: AC
  Filled 2017-12-20: qty 10

## 2017-12-20 MED ORDER — PHENOL 1.4 % MT LIQD: 1.0000 | OROMUCOSAL | Status: DC | PRN

## 2017-12-20 MED ORDER — ONDANSETRON HCL 4 MG/2ML IJ SOLN
INTRAMUSCULAR | Status: AC
  Filled 2017-12-20: qty 2

## 2017-12-20 MED ORDER — METOCLOPRAMIDE HCL 5 MG/ML IJ SOLN: 5.0000 mg | Freq: Three times a day (TID) | INTRAMUSCULAR | Status: DC | PRN

## 2017-12-20 MED ORDER — FENTANYL CITRATE (PF) 100 MCG/2ML IJ SOLN: 25.0000 ug | INTRAMUSCULAR | Status: DC | PRN

## 2017-12-20 MED ORDER — ONDANSETRON HCL 4 MG/2ML IJ SOLN: 4.0000 mg | Freq: Four times a day (QID) | INTRAMUSCULAR | Status: DC | PRN

## 2017-12-20 MED ORDER — TRAMADOL HCL 50 MG PO TABS
50.0000 mg | ORAL_TABLET | Freq: Four times a day (QID) | ORAL | Status: DC
  Administered 2017-12-20 – 2017-12-24 (×15): 50 mg via ORAL
  Filled 2017-12-20 (×15): qty 1

## 2017-12-20 MED ORDER — FENTANYL CITRATE (PF) 100 MCG/2ML IJ SOLN
25.0000 ug | INTRAMUSCULAR | Status: DC | PRN
  Administered 2017-12-20: 25 ug via INTRAVENOUS
  Administered 2017-12-20 (×2): 50 ug via INTRAVENOUS
  Administered 2017-12-20: 25 ug via INTRAVENOUS

## 2017-12-20 MED ORDER — LIDOCAINE 2% (20 MG/ML) 5 ML SYRINGE
INTRAMUSCULAR | Status: AC
  Filled 2017-12-20: qty 5

## 2017-12-20 MED ORDER — CLINDAMYCIN PHOSPHATE 900 MG/50ML IV SOLN
INTRAVENOUS | Status: AC
  Filled 2017-12-20: qty 50

## 2017-12-20 MED ORDER — PROPOFOL 10 MG/ML IV BOLUS
INTRAVENOUS | Status: AC
  Filled 2017-12-20: qty 20

## 2017-12-20 MED ORDER — CLINDAMYCIN PHOSPHATE 600 MG/50ML IV SOLN
600.0000 mg | Freq: Four times a day (QID) | INTRAVENOUS | Status: AC
  Administered 2017-12-20 (×2): 600 mg via INTRAVENOUS
  Filled 2017-12-20 (×2): qty 50

## 2017-12-20 SURGICAL SUPPLY — 81 items
BANDAGE ACE 4X5 VEL STRL LF (GAUZE/BANDAGES/DRESSINGS) ×2 IMPLANT
BANDAGE ACE 6X5 VEL STRL LF (GAUZE/BANDAGES/DRESSINGS) ×2 IMPLANT
BIT DRILL 2.4 AO COUPLING CANN (BIT) ×2 IMPLANT
BIT DRILL 4.3 (BIT) ×1 IMPLANT
BIT DRILL 4.8MMDIAX5IN DISPOSE (BIT) ×1 IMPLANT
BIT DRILL NCB FEM QC 4.5X195 (BIT) ×1 IMPLANT
BIT DRILL QC 3.3X195 (BIT) ×2 IMPLANT
BNDG GAUZE ELAST 4 BULKY (GAUZE/BANDAGES/DRESSINGS) ×2 IMPLANT
BONE CANC CHIPS 20CC PCAN1/4 (Bone Implant) ×2 IMPLANT
BONE CANC CHIPS 40CC CAN1/2 (Bone Implant) ×2 IMPLANT
CAP LOCK NCB (Cap) ×6 IMPLANT
CHIPS CANC BONE 20CC PCAN1/4 (Bone Implant) ×1 IMPLANT
CHIPS CANC BONE 40CC CAN1/2 (Bone Implant) ×1 IMPLANT
COVER WAND RF STERILE (DRAPES) IMPLANT
CUFF TOURNIQUET SINGLE 34IN LL (TOURNIQUET CUFF) IMPLANT
CUFF TOURNIQUET SINGLE 44IN (TOURNIQUET CUFF) IMPLANT
DRAPE IMP U-DRAPE 54X76 (DRAPES) ×2 IMPLANT
DRAPE STERI IOBAN 125X83 (DRAPES) ×2 IMPLANT
DRAPE SURG 17X23 STRL (DRAPES) ×2 IMPLANT
DRESSING ALLEVYN LIFE SACRUM (GAUZE/BANDAGES/DRESSINGS) ×2 IMPLANT
DRILL BIT 4.3 (BIT) ×1
DRILL BIT 4.5 (BIT) ×2
DRILL BIT 4.8MMDIAX5IN DISPOSE (BIT) ×2
DRSG ADAPTIC 3X8 NADH LF (GAUZE/BANDAGES/DRESSINGS) ×2 IMPLANT
DRSG PAD ABDOMINAL 8X10 ST (GAUZE/BANDAGES/DRESSINGS) ×2 IMPLANT
DURAPREP 26ML APPLICATOR (WOUND CARE) ×2 IMPLANT
ELECT CAUTERY BLADE 6.4 (BLADE) ×2 IMPLANT
ELECT REM PT RETURN 9FT ADLT (ELECTROSURGICAL) ×2
ELECTRODE REM PT RTRN 9FT ADLT (ELECTROSURGICAL) ×1 IMPLANT
EVACUATOR 1/8 PVC DRAIN (DRAIN) ×2 IMPLANT
GAUZE SPONGE 4X4 12PLY STRL (GAUZE/BANDAGES/DRESSINGS) ×2 IMPLANT
GAUZE XEROFORM 5X9 LF (GAUZE/BANDAGES/DRESSINGS) ×2 IMPLANT
GLOVE BIO SURGEON STRL SZ8 (GLOVE) ×2 IMPLANT
GLOVE BIOGEL PI IND STRL 7.5 (GLOVE) ×1 IMPLANT
GLOVE BIOGEL PI IND STRL 8 (GLOVE) ×1 IMPLANT
GLOVE BIOGEL PI INDICATOR 7.5 (GLOVE) ×1
GLOVE BIOGEL PI INDICATOR 8 (GLOVE) ×1
GLOVE ORTHO TXT STRL SZ7.5 (GLOVE) ×2 IMPLANT
GLOVE SURG ORTHO 8.0 STRL STRW (GLOVE) ×2 IMPLANT
GOWN STRL REUS W/ TWL LRG LVL3 (GOWN DISPOSABLE) ×3 IMPLANT
GOWN STRL REUS W/ TWL XL LVL3 (GOWN DISPOSABLE) ×2 IMPLANT
GOWN STRL REUS W/TWL LRG LVL3 (GOWN DISPOSABLE) ×3
GOWN STRL REUS W/TWL XL LVL3 (GOWN DISPOSABLE) ×2
K-WIRE 2.0 (WIRE) ×4
K-WIRE FXSTD 280X2XNS SS (WIRE) ×4
K-WIRE TROC 1.25X150 (WIRE) ×2
KIT BASIN OR (CUSTOM PROCEDURE TRAY) ×2 IMPLANT
KIT TURNOVER KIT B (KITS) ×2 IMPLANT
KWIRE FXSTD 280X2XNS SS (WIRE) ×4 IMPLANT
KWIRE TROC 1.25X150 (WIRE) ×1 IMPLANT
MANIFOLD NEPTUNE II (INSTRUMENTS) ×2 IMPLANT
NS IRRIG 1000ML POUR BTL (IV SOLUTION) ×2 IMPLANT
PACK GENERAL/GYN (CUSTOM PROCEDURE TRAY) ×2 IMPLANT
PACK UNIVERSAL I (CUSTOM PROCEDURE TRAY) ×2 IMPLANT
PAD ABD 8X10 STRL (GAUZE/BANDAGES/DRESSINGS) ×6 IMPLANT
PAD ARMBOARD 7.5X6 YLW CONV (MISCELLANEOUS) ×4 IMPLANT
PAD CAST 4YDX4 CTTN HI CHSV (CAST SUPPLIES) ×1 IMPLANT
PADDING CAST COTTON 4X4 STRL (CAST SUPPLIES) ×1
PADDING CAST COTTON 6X4 STRL (CAST SUPPLIES) ×2 IMPLANT
PLATE FEM DIST NCB PP 278MM (Plate) ×2 IMPLANT
PUTTY DBM STAGRAFT PLUS 10CC (Putty) ×2 IMPLANT
SCREW 5.0 60MM (Screw) ×2 IMPLANT
SCREW 5.0 80MM (Screw) ×4 IMPLANT
SCREW CANNULATED PT 4.0X55MM (Screw) ×2 IMPLANT
SCREW NCB 3.5X75X5X6.2XST (Screw) ×3 IMPLANT
SCREW NCB 4.0MX34M (Screw) ×2 IMPLANT
SCREW NCB 5.0X34MM (Screw) ×6 IMPLANT
SCREW NCB 5.0X36MM (Screw) ×2 IMPLANT
SCREW NCB 5.0X75MM (Screw) ×3 IMPLANT
SPONGE LAP 18X18 RF (DISPOSABLE) ×4 IMPLANT
STAPLER VISISTAT 35W (STAPLE) ×4 IMPLANT
SUT VIC AB 0 CT1 27 (SUTURE) ×5
SUT VIC AB 0 CT1 27XBRD ANBCTR (SUTURE) ×5 IMPLANT
SUT VIC AB 1 CT1 27 (SUTURE) ×5
SUT VIC AB 1 CT1 27XBRD ANBCTR (SUTURE) ×5 IMPLANT
SUT VIC AB 2-0 CT1 27 (SUTURE) ×3
SUT VIC AB 2-0 CT1 TAPERPNT 27 (SUTURE) ×3 IMPLANT
TOWEL OR 17X24 6PK STRL BLUE (TOWEL DISPOSABLE) ×2 IMPLANT
TOWEL OR 17X26 10 PK STRL BLUE (TOWEL DISPOSABLE) ×2 IMPLANT
TRAY FOLEY METER SIL LF 16FR (CATHETERS) ×2 IMPLANT
WATER STERILE IRR 1000ML POUR (IV SOLUTION) IMPLANT

## 2017-12-20 NOTE — Progress Notes (Signed)
  PROGRESS NOTE  Stephanie Donovan GLO:[id number] DOB: 1927/05/24 DOA: 12/17/2017 PCP: [facility]  Brief Narrative: 84 year old woman PMH hypothyroidism TIA, GI bleed, paroxysmal atrial fibrillation, diastolic CHF presented after mechanical fall resulting in right hip pain.  Imaging revealed comminuted displaced mid distal femoral fracture.  Assessment/Plan Femur periprosthetic fracture secondary to mechanical fall --Pain controlled.  Appears stable.  Plan for operative fixation today.   ABLA secondary to fracture.  Hemoglobin improved status post transfusion 2 units PRBC --Stable.  Check CBC in a.m.  Paroxysmal atrial fibrillation, paroxysmal SVT.  Not currently on anticoagulation. See 10/3017 office note Dr. Doreene Nest.  Documented not being on Plavix.  Aspirin suggested and patient accepted.  Patient refused other anticoagulation. See also office note 07/12/17: "She is asymptomatic. We review her history of paroxysmal atrial fibrillation as well as CVA and resolved GI bleed. She continues to decline anticoagulation". Echocardiogram August 2016: LVEF 55-60%.  Normal wall motion.  No regional wall motion abnormalities.  Grade 1 diastolic dysfunction.  CHA2DS2-VASc 6. --Asymptomatic.  No further evaluation suggested.  Not on any rate control agents.  PMH TIA 2015, GI bleed 2018, paroxysmal atrial fibrillation 2015  Hypothyroidism --Continue levothyroxine  Chronic diastolic CHF --Appears compensated  PMH TIA. --Patient refused Plavix but did agree to aspirin at last office visit.  Glaucoma --Continue Xalatan, timolol  No new issues.  Plan for surgery today.  DVT prophylaxis: SCDs Code Status: Full Family Communication: none Disposition Plan: pending PT eval post-surgery    Murray Hodgkins, MD  [facility] Direct contact: [phone] --Via [place]  --[email or web address]; password [email or web address]  7PM-7AM contact night coverage as above 12/20/2017,  10:48 AM  LOS: 3 days   Consultants:  Orthopedics   Procedures:  Transfusion 2 units PRBC 11/27  Antimicrobials:    Interval history/Subjective: No issues overnight.  Breathing fine.  Some pain in right toes.  Objective: Vitals:  Vitals:   12/19/17 2001 12/20/17 0637  BP: 109/65 114/62  Pulse: 96 83  Resp: 15   Temp: 98.3 F (36.8 C) 98.4 F (36.9 C)  SpO2: 94% 92%    Exam:  Constitutional:   . Appears calm and comfortable Respiratory:  . CTA bilaterally, no w/r/r.  . Respiratory effort normal.  Cardiovascular:  . RRR, no m/r/g . No pedal extremity edema   Psychiatric:  . Mental status o Mood, affect appropriate  I have personally reviewed the following:   Data: . Hemoglobin stable, 9.6.  CBC otherwise unremarkable.  Scheduled Meds: . acetaminophen  650 mg Oral Q8H  . feeding supplement  1 Container Oral Q24H  . Influenza vac split quadrivalent PF  0.5 mL Intramuscular Tomorrow-1000  . latanoprost  1 drop Both Eyes QHS  . levothyroxine  88 mcg Oral Q0600  . magnesium oxide  400 mg Oral BID  . multivitamin with minerals  1 tablet Oral Daily  . timolol  1 drop Both Eyes BID   Continuous Infusions:   Principal Problem:   Femur fracture (HCC) Active Problems:   Hypothyroidism   Paroxysmal A-fib (HCC)   Chronic diastolic CHF (congestive heart failure) (HCC)   Fall   HLD (hyperlipidemia)   Chronic anemia   Malnutrition of moderate degree   Acute blood loss anemia   LOS: 3 days

## 2017-12-20 NOTE — Progress Notes (Signed)
Patient ID: Stephanie Donovan, female   DOB: [birth year], 84 y.o.   MRN: [id number] The patient is presenting for surgery today on her right distal femur fracture.  Risks and benefits have been explained in detail and informed consent is obtain.

## 2017-12-20 NOTE — OR Nursing (Signed)
1525: allevyn sacral pad place to initiate protocol; skin clear at this time.

## 2017-12-20 NOTE — Plan of Care (Signed)
  Problem: Pain Managment: Goal: General experience of comfort will improve Outcome: Progressing   Problem: Safety: Goal: Ability to remain free from injury will improve Outcome: Progressing   Problem: Skin Integrity: Goal: Risk for impaired skin integrity will decrease Outcome: Progressing   

## 2017-12-20 NOTE — Anesthesia Procedure Notes (Signed)
Procedure Name: Intubation Date/Time: 12/20/2017 12:22 PM Performed by: Jenne Campus, CRNA Pre-anesthesia Checklist: Patient identified, Emergency Drugs available, Suction available and Patient being monitored Patient Re-evaluated:Patient Re-evaluated prior to induction Oxygen Delivery Method: Circle System Utilized Preoxygenation: Pre-oxygenation with 100% oxygen Induction Type: IV induction Ventilation: Mask ventilation without difficulty Laryngoscope Size: Miller and 2 Grade View: Grade I Tube type: Oral Tube size: 7.0 mm Number of attempts: 1 Airway Equipment and Method: Stylet and Oral airway Placement Confirmation: ETT inserted through vocal cords under direct vision,  positive ETCO2 and breath sounds checked- equal and bilateral Secured at: 22 cm Tube secured with: Tape Dental Injury: Teeth and Oropharynx as per pre-operative assessment

## 2017-12-20 NOTE — Plan of Care (Signed)
  Problem: Pain Managment: Goal: General experience of comfort will improve Outcome: Progressing   Problem: Safety: Goal: Ability to remain free from injury will improve Outcome: Progressing   

## 2017-12-20 NOTE — Transfer of Care (Signed)
Immediate Anesthesia Transfer of Care Note  Patient: Stephanie Donovan  Procedure(s) Performed: OPEN REDUCTION INTERNAL FIXATION (ORIF) DISTAL FEMUR FRACTURE (Right Leg Upper)  Patient Location: PACU  Anesthesia Type:General  Level of Consciousness: awake, oriented and patient cooperative  Airway & Oxygen Therapy: Patient Spontanous Breathing and Patient connected to face mask oxygen  Post-op Assessment: Report given to RN and Post -op Vital signs reviewed and stable  Post vital signs: Reviewed  Last Vitals:  Vitals Value Taken Time  BP    Temp    Pulse    Resp    SpO2      Last Pain:  Vitals:   12/20/17 1000  TempSrc:   PainSc: 0-No pain      Patients Stated Pain Goal: 3 (40/76/80 8811)  Complications: No apparent anesthesia complications

## 2017-12-20 NOTE — Brief Op Note (Signed)
12/17/2017 - 12/20/2017  3:13 PM  PATIENT:  Stephanie Donovan  84 y.o. female  PRE-OPERATIVE DIAGNOSIS:  right periprosthetic fx  POST-OPERATIVE DIAGNOSIS:  right periprosthetic femur fracture  PROCEDURE:  Procedure(s): OPEN REDUCTION INTERNAL FIXATION (ORIF) DISTAL FEMUR FRACTURE (Right)  SURGEON:  Surgeon(s) and Role:    [name], MD - Primary  PHYSICIAN ASSISTANT:  Benita Stabile, PA-C  ANESTHESIA:   general  EBL:  600 mL   COUNTS:  YES  TOURNIQUET:  * No tourniquets in log *  DICTATION: .Other Dictation: Dictation Number [id number]  PLAN OF CARE: Admit to inpatient   PATIENT DISPOSITION:  PACU - hemodynamically stable.   Delay start of Pharmacological VTE agent (>24hrs) due to surgical blood loss or risk of bleeding: no

## 2017-12-20 NOTE — Op Note (Signed)
NAME: Stephanie Donovan, Stephanie Donovan MEDICAL RECORD [id number] ACCOUNT [id number] DATE OF BIRTH:Feb 21, 1927 FACILITY: [facility] LOCATION: [facility] PHYSICIAN:Chandria Rookstool Kerry Fort, MD  OPERATIVE REPORT  DATE OF PROCEDURE:  12/20/2017  PREOPERATIVE DIAGNOSIS:  Right supracondylar distal femur fracture with intra-articular extension into the right knee joint.  POSTOPERATIVE DIAGNOSIS:  Right supracondylar distal femur fracture with intraarticular extension into the right knee joint.  PROCEDURE:  Open reduction internal fixation of right supracondylar distal femur fracture with intercondylar extension.  IMPLANTS:   1.  Zimmer-Biomet 12-hole NCB plate and screws. 2.  Supplemental bone grafting with cancellous chips and putty.  SURGEON:  Lind Guest. Ninfa Linden, MD  ASSISTANT:  Erskine Emery, PA-C.  ANESTHESIA:  General.  ANTIBIOTICS:  900 mg IV clindamycin.  ESTIMATED BLOOD LOSS:  600 mL  UNITS OF BLOOD GIVEN:  One.  COMPLICATIONS:  None.  INDICATIONS:  The patient is an 84 year old female who sustained a mechanical fall earlier this week, sustaining a significant distal femur fracture of the shaft of the distal femur extending into the joint itself.  She has osteopenic bone and there was  significant comminution in both areas.  She has a previous right total hip arthroplasty proximal to this.  She was admitted to the medicine service and it took some days to get her medically stabilized for surgery.  She did require transfusion  preoperative as well.  We had a long and thorough discussion about the surgery.  It is going to be quite tenuous given the nature of her bone and she will not be able to weightbear after this until we get some sufficient healing.  We had a long and  thorough discussion about this and she does wish to proceed with surgery.  DESCRIPTION OF PROCEDURE:  After informed consent was obtained and appropriate right knee and thigh were marked, she brought to the operating  room and placed supine on the operating table.  General anesthesia was then obtained.  A Foley catheter was  placed.  Her right thigh from the groin down to the knee and passed into the leg were prepped and draped with DuraPrep and sterile drapes.  A time-out was called.  She was identified as correct patient, correct right knee.  We also used a sterile  stockinette.  We then made a lateral incision from the distal femoral condyle to the mid femur area to gain exposure of the fracture.  When I was able to dissect down to the IT band, I found it highly comminuted fracture involving the shaft distally that  was almost like a segmental fracture.  That was in at least 3 pieces including the shaft itself.  There was also an intercondylar fracture of the distal femur and a Hoffa component as well.  This was quite difficult to get ____ reduced position as  possible using temporary K-wires and reduction forceps and clamps.  Once I was able to get as close to reduce as possible, I chose a 12-hole Biomet-Zimmer NCB plate and placed this along the lateral cortex of the femur from distal to proximal.  This did  extend past the distal aspect of her femoral component from her previous hip replacement.  I tried to get the plate more posterior, but it did not want to allow me based on getting everything as lined up as possible.  I finally was able to at least get  an adequate reduction as possible and I did secure the Hoffa fragment with an anterior to posterior 4.0 partially threaded cancellous screw.  I then secured the 12-hole NCB plate to the femur proximally and distally.  Distal was locking screws and I put  locking caps on this as well.  I verified the reduction under direct visualization and fluoroscopy.  Once that was done, I then irrigated the soft tissue with normal saline solution.  I packed all around the anterior medial and posterior aspects of the  femur with an abundant number of cancellous chips and DBM  putty.  I then closed the deep Hemovac drain underneath the IT band with a running #1 Vicryl suture, 0 Vicryl in the deep tissue, 2-0 Vicryl subcutaneous tissue and interrupted staples on the  skin.  Xeroform well-padded sterile dressing was applied.  Knee immobilizer placed.  She was awakened, extubated, and taken to recovery room in stable condition.  All final counts were correct.  There were no complications noted.  Of note, Benita Stabile,  PA-C, assisted the entire case.  His assistance was crucial for facilitating all aspects of this case.  TN/NUANCE  D:12/20/2017 T:12/20/2017 JOB:[id number]

## 2017-12-20 NOTE — Anesthesia Preprocedure Evaluation (Addendum)
Anesthesia Evaluation  Patient identified by MRN, date of birth, ID band Patient awake    Reviewed: Allergy & Precautions, NPO status , Patient's Chart, lab work & pertinent test results  History of Anesthesia Complications Negative for: history of anesthetic complications  Airway Mallampati: II  TM Distance: >3 FB Neck ROM: Full    Dental  (+) Dental Advisory Given, Partial Upper, Partial Lower   Pulmonary neg pulmonary ROS,    breath sounds clear to auscultation       Cardiovascular +CHF  + dysrhythmias Atrial Fibrillation  Rhythm:Irregular Rate:Normal   '16 Carotid duplex - 1-39% left ICAS, no right ICAS  '16 TTE - EF 55% to 60%. Grade 1 diastolic dysfunction. Trivial AI.    Neuro/Psych TIAnegative psych ROS   GI/Hepatic negative GI ROS, Neg liver ROS,   Endo/Other  Hypothyroidism  Hyponatremia Hypocalcemia   Renal/GU negative Renal ROS     Musculoskeletal negative musculoskeletal ROS (+)   Abdominal   Peds  Hematology  (+) anemia ,   Anesthesia Other Findings   Reproductive/Obstetrics                            Anesthesia Physical Anesthesia Plan  ASA: III  Anesthesia Plan: General   Post-op Pain Management:    Induction: Intravenous  PONV Risk Score and Plan: 4 or greater and Treatment may vary due to age or medical condition, Ondansetron and Propofol infusion  Airway Management Planned: Oral ETT  Additional Equipment: None  Intra-op Plan:   Post-operative Plan: Extubation in OR  Informed Consent: I have reviewed the patients History and Physical, chart, labs and discussed the procedure including the risks, benefits and alternatives for the proposed anesthesia with the patient or authorized representative who has indicated his/her understanding and acceptance.   Dental advisory given  Plan Discussed with: CRNA and Anesthesiologist  Anesthesia Plan Comments:         Anesthesia Quick Evaluation

## 2017-12-20 NOTE — Anesthesia Postprocedure Evaluation (Signed)
Anesthesia Post Note  Patient: Stephanie Donovan  Procedure(s) Performed: OPEN REDUCTION INTERNAL FIXATION (ORIF) DISTAL FEMUR FRACTURE (Right Leg Upper)     Patient location during evaluation: PACU Anesthesia Type: General Level of consciousness: awake Pain management: pain level controlled Vital Signs Assessment: post-procedure vital signs reviewed and stable Respiratory status: spontaneous breathing Cardiovascular status: stable Postop Assessment: no apparent nausea or vomiting Anesthetic complications: no    Last Vitals:  Vitals:   12/20/17 1645 12/20/17 1711  BP: (!) 165/88 (!) 146/91  Pulse: (!) 106 (!) 101  Resp: 20 18  Temp: 36.5 C 36.5 C  SpO2: 99%     Last Pain:  Vitals:   12/20/17 1711  TempSrc: Oral  PainSc:                  [name]

## 2017-12-21 LAB — BASIC METABOLIC PANEL
Anion gap: 10 (ref 5–15)
BUN: 16 mg/dL (ref 8–23)
CO2: 28 mmol/L (ref 22–32)
CREATININE: 0.61 mg/dL (ref 0.44–1.00)
Calcium: 8.2 mg/dL — ABNORMAL LOW (ref 8.9–10.3)
Chloride: 96 mmol/L — ABNORMAL LOW (ref 98–111)
GFR calc Af Amer: >60 mL/min (ref >60)
GFR calc non Af Amer: >60 mL/min (ref >60)
Glucose, Bld: 124 mg/dL — ABNORMAL HIGH (ref 70–99)
Potassium: 4.4 mmol/L (ref 3.5–5.1)
Sodium: 134 mmol/L — ABNORMAL LOW (ref 135–145)

## 2017-12-21 LAB — CBC
HCT: 28.6 % — ABNORMAL LOW (ref 36.0–46.0)
Hemoglobin: 9.1 g/dL — ABNORMAL LOW (ref 12.0–15.0)
MCH: 27.7 pg (ref 26.0–34.0)
MCHC: 31.8 g/dL (ref 30.0–36.0)
MCV: 87.2 fL (ref 80.0–100.0)
Platelets: 194 10*3/uL (ref 150–400)
RBC: 3.28 MIL/uL — ABNORMAL LOW (ref 3.87–5.11)
RDW: 15.1 % (ref 11.5–15.5)
WBC: 11.6 10*3/uL — ABNORMAL HIGH (ref 4.0–10.5)
nRBC: 0 % (ref 0.0–0.2)

## 2017-12-21 MED ORDER — CLOPIDOGREL BISULFATE 75 MG PO TABS
75.0000 mg | ORAL_TABLET | Freq: Every day | ORAL | Status: DC
  Administered 2017-12-21: 75 mg via ORAL
  Filled 2017-12-21: qty 1

## 2017-12-21 NOTE — [place] FL2 (Signed)
[place] MEDICAID FL2 LEVEL OF CARE SCREENING TOOL     IDENTIFICATION  Patient Name: Stephanie Donovan Birthdate: Feb 01, 1927 Sex: female Admission Date (Current Location): 12/17/2017  [facility] and [place] Number:  Herbalist and Address:  [facility], [place] [address] [place], [place] [address]      Provider Number: [id number]  Attending Physician Name and Address:  Samuella Cota, MD  Relative Name and Phone Number:  Leoma Folds, daughter, [phone]    Current Level of Care: Hospital Recommended Level of Care: [place] Prior Approval Number:    Date Approved/Denied:   PASRR Number: [id number]  Discharge Plan: SNF    Current Diagnoses: Patient Active Problem List   Diagnosis Date Noted  . Acute blood loss anemia 12/19/2017  . Malnutrition of moderate degree 12/18/2017  . Femur fracture ([place]) 12/17/2017  . Fall 12/17/2017  . HLD (hyperlipidemia) 12/17/2017  . Chronic anemia 12/17/2017  . Closed 4-part fracture of proximal humerus, right, initial encounter 10/09/2017  . Paroxysmal A-fib ([place]) 09/21/2017  . Closed displaced fracture of right femoral neck ([place]) 09/21/2017  . Closed fracture of right proximal humerus 09/21/2017  . Dehydration 09/21/2017  . Hyponatremia 09/21/2017  . Elevated CK 09/21/2017  . Thyroid nodule 09/21/2017  . Closed right hip fracture ([place]) 09/21/2017  . Chronic diastolic CHF (congestive heart failure) ([place]) 09/21/2017  . Sinus tachycardia 09/21/2017  . Acute lower UTI 09/21/2017  . TIA (transient ischemic attack) 08/25/2014  . Aphasia 08/25/2014  . Chronic constipation 05/19/2014  . Fracture of multiple pubic rami ([place]) 04/11/2014  . Pubic bone fracture ([place]) 04/11/2014  . Left hip pain 04/11/2014  . Atrial fibrillation with RVR ([place]) 03/28/2013  . Diarrhea 03/28/2013  . Hypothyroidism 03/24/2013  . Syncope 03/24/2013  . Glaucoma 03/24/2013  . Hypokalemia 03/24/2013   . Acute gastroenteritis 03/24/2013    Orientation RESPIRATION BLADDER Height & Weight     Self, Situation, Place, Time  O2(2L nasal canula) Continent, Indwelling catheter Weight: 152 lb 8.9 oz (69.2 kg)(weight with buck's traction) Height:  5\' 3"  (160 cm)  BEHAVIORAL SYMPTOMS/MOOD NEUROLOGICAL BOWEL NUTRITION STATUS      Continent Diet(see discharge summary)  AMBULATORY STATUS COMMUNICATION OF NEEDS Skin   Extensive Assist Verbally Surgical wounds, Other (Comment)(MASD on left groin; surgical incision on right leg with compression wrap and hemovac)                       Personal Care Assistance Level of Assistance  Bathing, Feeding, Dressing Bathing Assistance: Maximum assistance Feeding assistance: Limited assistance Dressing Assistance: Maximum assistance     Functional Limitations Info  Sight, Hearing, Speech Sight Info: Adequate Hearing Info: Adequate Speech Info: Adequate    SPECIAL CARE FACTORS FREQUENCY  PT (By licensed PT), OT (By licensed OT)     PT Frequency: 5x week OT Frequency: 5x week            Contractures Contractures Info: Not present    Additional Factors Info  Code Status, Allergies Code Status Info: Full Code Allergies Info: ASPIRIN, PENICILLINS, CODEINE            Current Medications (12/21/2017):  This is the current hospital active medication list Current Facility-Administered Medications  Medication Dose Route Frequency Provider Last Rate Last Dose  . acetaminophen (TYLENOL) tablet 325-650 mg  325-650 mg Oral Q6H PRN [name], MD      . acetaminophen (TYLENOL) tablet 650 mg  650 mg Oral Q8H [name], MD   650 mg at 12/21/17 1033  . alum & mag hydroxide-simeth (MAALOX/MYLANTA) 200-200-20 MG/5ML suspension 30 mL  30 mL Oral Q4H PRN [name], MD      . clopidogrel (PLAVIX) tablet 75 mg  75 mg Oral Daily [name], MD   75 mg at 12/21/17 1033  . docusate sodium (COLACE)  capsule 100 mg  100 mg Oral BID [name], MD   100 mg at 12/21/17 1033  . feeding supplement (BOOST / RESOURCE BREEZE) liquid 1 Container  1 Container Oral Q24H [name], MD   1 Container at 12/20/17 2130  . HYDROcodone-acetaminophen (NORCO) 7.5-325 MG per tablet 1-2 tablet  1-2 tablet Oral Q4H PRN [name], MD      . HYDROcodone-acetaminophen (NORCO/VICODIN) 5-325 MG per tablet 1-2 tablet  1-2 tablet Oral Q4H PRN [name], MD      . Influenza vac split quadrivalent PF (FLUZONE HIGH-DOSE) injection 0.5 mL  0.5 mL Intramuscular Tomorrow-1000 [name], MD      . lactated ringers infusion   Intravenous Continuous [name], MD 10 mL/hr at 12/20/17 1139    . latanoprost (XALATAN) 0.005 % ophthalmic solution 1 drop  1 drop Both Eyes QHS [name], MD   1 drop at 12/20/17 2131  . levothyroxine (SYNTHROID, LEVOTHROID) tablet 88 mcg  88 mcg Oral Q0600 [name], MD   88 mcg at 12/21/17 0535  . menthol-cetylpyridinium (CEPACOL) lozenge 3 mg  1 lozenge Oral PRN [name], MD       Or  . phenol (CHLORASEPTIC) mouth spray 1 spray  1 spray Mouth/Throat PRN [name], MD      . methocarbamol (ROBAXIN) tablet 500 mg  500 mg Oral Q8H PRN [name], MD   500 mg at 12/20/17 1625  . metoCLOPramide (REGLAN) tablet 5-10 mg  5-10 mg Oral Q8H PRN [name], MD       Or  . metoCLOPramide (REGLAN) injection 5-10 mg  5-10 mg Intravenous Q8H PRN [name], MD      . morphine 2 MG/ML injection 0.5 mg  0.5 mg Intravenous Q2H PRN [name], MD   0.5 mg at 12/17/17 2145  . morphine 2 MG/ML injection 0.5-1 mg  0.5-1 mg Intravenous Q2H PRN [name], MD      . multivitamin with minerals tablet 1 tablet  1 tablet Oral Daily [name], MD   1 tablet at 12/21/17 1033  . ondansetron (ZOFRAN) tablet 4 mg  4 mg  Oral Q6H PRN [name], MD       Or  . ondansetron [facility]) injection 4 mg  4 mg Intravenous Q6H PRN [name], MD      . polyethylene glycol (MIRALAX / GLYCOLAX) packet 17 g  17 g Oral Daily PRN [name], MD      . timolol (TIMOPTIC) 0.5 % ophthalmic solution 1 drop  1 drop Both Eyes BID [name], MD   1 drop at 12/21/17 1034  . traMADol (ULTRAM) tablet 50 mg  50 mg Oral Q6H [name], MD   50 mg at 12/21/17 0536     Discharge Medications: Please see discharge summary for a list of discharge medications.  Relevant Imaging Results:  Relevant Lab Results:   Additional Information [id number] [place], [place]

## 2017-12-21 NOTE — Progress Notes (Addendum)
Subjective: 1 Day Post-Op Procedure(s) (LRB): OPEN REDUCTION INTERNAL FIXATION (ORIF) DISTAL FEMUR FRACTURE (Right) Patient reports pain as moderate.  Tolerated surgery well yesterday.  Acute blood loss anemia from her extensive fracture and surgery.  Objective: Vital signs in last 24 hours: Temp:  [97.7 F (36.5 C)-98.4 F (36.9 C)] 98.4 F (36.9 C) (11/30 0457) Pulse Rate:  [88-106] 101 (11/30 0803) Resp:  [14-20] 16 (11/30 0803) BP: (105-166)/(57-91) 114/72 (11/30 0803) SpO2:  [96 %-100 %] 99 % (11/30 0803)  Intake/Output from previous day: 11/29 0701 - 11/30 0700 In: 1865 [I.V.:1300; Blood:315; IV Piggyback:250] Out: 7341 [PFXTK:2409; Drains:125; Blood:600] Intake/Output this shift: No intake/output data recorded.  Recent Labs    12/18/17 1948 12/19/17 0142 12/20/17 0130 12/20/17 1841 12/21/17 0443  HGB 10.1* 9.8* 9.6* 10.5* 9.1*   Recent Labs    12/20/17 1841 12/21/17 0443  WBC 16.6* 11.6*  RBC 3.77* 3.28*  HCT 32.4* 28.6*  PLT 189 194   Recent Labs    12/18/17 0858 12/21/17 0443  NA 132* 134*  K 4.1 4.4  CL 99 96*  CO2 27 28  BUN 12 16  CREATININE 0.57 0.61  GLUCOSE 118* 124*  CALCIUM 8.1* 8.2*   Recent Labs    12/18/17 0858  INR 1.09    Sensation intact distally Intact pulses distally Dorsiflexion/Plantar flexion intact Incision: dressing C/D/I Compartment soft  Right foot and leg are swollen as expected.  Assessment/Plan: 1 Day Post-Op Procedure(s) (LRB): OPEN REDUCTION INTERNAL FIXATION (ORIF) DISTAL FEMUR FRACTURE (Right) Up with therapy - non-weight bearing on right leg; continue knee immobilizer Will continue hemovac drain today and pull it tomorrow. Will need skilled nursing following her acute hospital stay - will consult social work Hold on DVT coverage meds other than will resume her Plavix today due to acute blood loss anemia and extensive fracture.  Will likely resume her Lovenox tomorrow.    Stephanie Donovan 12/21/2017, 8:17 AM

## 2017-12-21 NOTE — Evaluation (Signed)
Physical Therapy Evaluation Patient Details Name: Stephanie Donovan MRN: [id number] DOB: 25-Dec-1927 Today's Date: 12/21/2017   History of Present Illness  84 year old woman PMH hypothyroidism TIA, GI bleed, paroxysmal atrial fibrillation, diastolic CHF presented after mechanical fall resulting in right hip pain.  Imaging revealed comminuted displaced mid distal femoral fracture.  ORIF on Right femur on 12/20/17.  PMH significant for R proximal humerus fx, s/p reverse TSA 09/2017, TKR 10/2011, Right hip hemiarthroplasty secondary to fall/fx 09/2017, TIA (2015), syncope.   Clinical Impression  Patient presents with dependencies in gait and mobility due to NWB status, generalized weakness, decreased balance.  Patient with history of multiple falls.  Patient will benefit from PT to progress mobility and independence.  Feel SNF may be best option with limited WB status.  Once able to fully weight bear, patient would like to return to her daughters home.      Follow Up Recommendations SNF    Equipment Recommendations  None recommended by PT    Recommendations for Other Services       Precautions / Restrictions Precautions Precautions: Fall Required Braces or Orthoses: Knee Immobilizer - Right Knee Immobilizer - Right: On at all times Restrictions Weight Bearing Restrictions: Yes RLE Weight Bearing: Non weight bearing Other Position/Activity Restrictions: need to clarify weight bearing on Right UE due to recent reverse TSA      Mobility  Bed Mobility Overal bed mobility: Needs Assistance Bed Mobility: Supine to Sit     Supine to sit: Min assist     General bed mobility comments: assist to scoot hips to edge of bed and to lift shoulders off bed  Transfers Overall transfer level: Needs assistance Equipment used: None Transfers: Stand Pivot Transfers   Stand pivot transfers: Mod assist       General transfer comment: mod assist to power up for transfer and to maintain NWB  during transfer  Ambulation/Gait Ambulation/Gait assistance: (unable at this time)              Stairs            Wheelchair Mobility    Modified Rankin (Stroke Patients Only)       Balance Overall balance assessment: Needs assistance Sitting-balance support: No upper extremity supported;Feet supported Sitting balance-Leahy Scale: Fair                                       Pertinent Vitals/Pain Pain Assessment: 0-10 Pain Score: 4  Pain Location: right LE Pain Descriptors / Indicators: Discomfort;Guarding Pain Intervention(s): Limited activity within patient's tolerance;Monitored during session;Repositioned    Home Living Family/patient expects to be discharged to:: Skilled nursing facility                 Additional Comments: prior to admission was living with daughter in 1-level home; her home is a 2 story townhome.    Prior Function Level of Independence: Independent with assistive device(s)         Comments: used cane for ambulation     Hand Dominance        Extremity/Trunk Assessment   Upper Extremity Assessment Upper Extremity Assessment: Defer to OT evaluation    Lower Extremity Assessment Lower Extremity Assessment: Overall WFL for tasks assessed    Cervical / Trunk Assessment Cervical / Trunk Assessment: Kyphotic  Communication   Communication: No difficulties  Cognition Arousal/Alertness: Awake/alert Behavior During Therapy: WFL for tasks  assessed/performed Overall Cognitive Status: Within Functional Limits for tasks assessed                                        General Comments      Exercises     Assessment/Plan    PT Assessment Patient needs continued PT services  PT Problem List Decreased strength;Decreased range of motion;Decreased activity tolerance;Decreased balance;Decreased mobility       PT Treatment Interventions DME instruction;Gait training;Functional mobility  training;Balance training;Therapeutic exercise;Therapeutic activities;Patient/family education    PT Goals (Current goals can be found in the Care Plan section)  Acute Rehab PT Goals Patient Stated Goal: go back home and stop falling PT Goal Formulation: With patient Time For Goal Achievement: 12/28/17 Potential to Achieve Goals: Good    Frequency Min 3X/week   Barriers to discharge        Co-evaluation               AM-PAC PT "6 Clicks" Mobility  Outcome Measure Help needed turning from your back to your side while in a flat bed without using bedrails?: A Lot Help needed moving from lying on your back to sitting on the side of a flat bed without using bedrails?: A Lot Help needed moving to and from a bed to a chair (including a wheelchair)?: A Lot Help needed standing up from a chair using your arms (e.g., wheelchair or bedside chair)?: A Lot Help needed to walk in hospital room?: Total Help needed climbing 3-5 steps with a railing? : Total 6 Click Score: 10    End of Session Equipment Utilized During Treatment: Right knee immobilizer Activity Tolerance: Patient tolerated treatment well Patient left: in chair;with chair alarm set;with call bell/phone within reach Nurse Communication: Mobility status PT Visit Diagnosis: Other abnormalities of gait and mobility (R26.89);Repeated falls (R29.6);Muscle weakness (generalized) (M62.81);Difficulty in walking, not elsewhere classified (R26.2)    Time: 1337-1410 PT Time Calculation (min) (ACUTE ONLY): 33 min   Charges:   PT Evaluation $PT Eval Moderate Complexity: 1 Mod PT Treatments $Therapeutic Activity: 8-22 mins        12/21/2017 Kendrick [place], PT Acute Rehabilitation Services Pager:  [phone] Office:  [phone]    Shanna Cisco 12/21/2017, 2:21 PM

## 2017-12-21 NOTE — Social Work (Signed)
CSW acknowledging consult for SNF placement. Will follow for therapy recommendations.   Braiden Presutti, MSW, LCSWA [facility] Social Work [phone]   

## 2017-12-21 NOTE — Progress Notes (Signed)
PROGRESS NOTE  Stephanie Donovan TGG:[id number] DOB: 1927-09-05 DOA: 12/17/2017 PCP: [facility]  Brief Narrative: 84 year old woman PMH hypothyroidism TIA, GI bleed, paroxysmal atrial fibrillation, diastolic CHF presented after mechanical fall resulting in right hip pain.  Imaging revealed comminuted displaced mid distal femoral fracture.  Assessment/Plan Femur periprosthetic fracture secondary to mechanical fall --Stable postoperative day 1.  Up with therapy, nonweightbearing right leg, continue knee immobilizer.  Hold Lovenox today per orthopedics.  ABLA secondary to fracture.  Hemoglobin improved status post transfusion 2 units PRBC --Hemoglobin somewhat lower today.  Check CBC in a.m.  Hold Lovenox today per orthopedics.  Paroxysmal atrial fibrillation, paroxysmal SVT.  Not currently on anticoagulation. See 10/3017 office note Dr. Doreene Nest.  Documented not being on Plavix.  Aspirin suggested and patient accepted.  Patient refused other anticoagulation. See also office note 07/12/17: "She is asymptomatic. We review her history of paroxysmal atrial fibrillation as well as CVA and resolved GI bleed. She continues to decline anticoagulation". Echocardiogram August 2016: LVEF 55-60%.  Normal wall motion.  No regional wall motion abnormalities.  Grade 1 diastolic dysfunction.  CHA2DS2-VASc 6. --Remains asymptomatic.  Telemetry shows sinus tachycardia with PVCs.   --Discontinue telemetry.  PMH TIA 2015, GI bleed 2018, paroxysmal atrial fibrillation 2015 --Refused Plavix on last office visit with PCP.  Start aspirin tomorrow if hemoglobin stable.  Hypothyroidism --Continue levothyroxine  Chronic diastolic CHF --Appears stable.  PMH TIA. --Patient refused Plavix but did agree to aspirin at last office visit.  Glaucoma --Continue Xalatan, timolol   Hold Lovenox today.  Check CBC in a.m.  Await PT evaluation  DVT prophylaxis: SCDs Code Status:  Full Family Communication: none Disposition Plan: pending PT eval post-surgery    Murray Hodgkins, MD  [facility] Direct contact: [phone] --[address]ia [place]  --[email or web address]; password [email or web address]  7PM-7AM contact night coverage as above 12/21/2017, 12:41 PM  LOS: 4 days   Consultants:  Orthopedics   Procedures:  Transfusion 2 units PRBC 11/27  11/29: Open reduction internal fixation of right supracondylar distal femur fracture with intercondylar extension.  Antimicrobials:    Interval history/Subjective: Overall feeling okay.  Objective: Vitals:  Vitals:   12/21/17 0457 12/21/17 0803  BP: (!) 113/57 114/72  Pulse: 88 (!) 101  Resp: 14 16  Temp: 98.4 F (36.9 C)   SpO2: 100% 99%    Exam:  Constitutional:   . Appears calm and comfortable Respiratory:  . CTA bilaterally, no w/r/r.  . Respiratory effort normal.  Cardiovascular:  . RRR, no m/r/g . 1+ bilateral pedal edema Musculoskeletal:  . RLE, LLE   . Moves both feet to command Psychiatric:  . Mental status o Mood, affect appropriate  I have personally reviewed the following:   Data: . Hemoglobin 9.1, decreasing.  Platelets stable.  WBC decreasing, 11.6.  BMP unremarkable.  Scheduled Meds: . acetaminophen  650 mg Oral Q8H  . clopidogrel  75 mg Oral Daily  . docusate sodium  100 mg Oral BID  . feeding supplement  1 Container Oral Q24H  . Influenza vac split quadrivalent PF  0.5 mL Intramuscular Tomorrow-1000  . latanoprost  1 drop Both Eyes QHS  . levothyroxine  88 mcg Oral Q0600  . multivitamin with minerals  1 tablet Oral Daily  . timolol  1 drop Both Eyes BID  . traMADol  50 mg Oral Q6H   Continuous Infusions: . lactated ringers 10 mL/hr at 12/20/17 1139    Principal Problem:   Femur fracture (  [place]) Active Problems:   Hypothyroidism   Paroxysmal A-fib ([facility])   Chronic diastolic CHF (congestive heart failure) ([facility])   Fall   HLD (hyperlipidemia)   Chronic anemia   Malnutrition  of moderate degree   Acute blood loss anemia   LOS: 4 days

## 2017-12-22 LAB — CBC
HCT: 25.1 % — ABNORMAL LOW (ref 36.0–46.0)
HEMOGLOBIN: 7.9 g/dL — AB (ref 12.0–15.0)
MCH: 27.8 pg (ref 26.0–34.0)
MCHC: 31.5 g/dL (ref 30.0–36.0)
MCV: 88.4 fL (ref 80.0–100.0)
Platelets: 186 10*3/uL (ref 150–400)
RBC: 2.84 MIL/uL — ABNORMAL LOW (ref 3.87–5.11)
RDW: 15 % (ref 11.5–15.5)
WBC: 10.6 10*3/uL — AB (ref 4.0–10.5)
nRBC: 0 % (ref 0.0–0.2)

## 2017-12-22 LAB — TYPE AND SCREEN
ABO/RH(D): O POS
Antibody Screen: POSITIVE
DONOR AG TYPE: NEGATIVE
DONOR AG TYPE: NEGATIVE
DONOR AG TYPE: NEGATIVE
Donor AG Type: NEGATIVE
PT AG TYPE: NEGATIVE
UNIT DIVISION: 0
UNIT DIVISION: 0
UNIT DIVISION: 0
Unit division: 0

## 2017-12-22 LAB — BPAM RBC
BLOOD PRODUCT EXPIRATION DATE: 201912252359
Blood Product Expiration Date: 201912252359
Blood Product Expiration Date: 201912272359
Blood Product Expiration Date: 201912272359
ISSUE DATE / TIME: 201911271558
ISSUE DATE / TIME: 201911291235
ISSUE DATE / TIME: 201911271343
ISSUE DATE / TIME: 201911291235
UNIT TYPE AND RH: 5100
UNIT TYPE AND RH: 5100
Unit Type and Rh: 5100
Unit Type and Rh: 5100

## 2017-12-22 LAB — BASIC METABOLIC PANEL
Anion gap: 10 (ref 5–15)
BUN: 17 mg/dL (ref 8–23)
CO2: 28 mmol/L (ref 22–32)
Calcium: 8.1 mg/dL — ABNORMAL LOW (ref 8.9–10.3)
Chloride: 93 mmol/L — ABNORMAL LOW (ref 98–111)
Creatinine, Ser: 0.71 mg/dL (ref 0.44–1.00)
GFR calc Af Amer: >60 mL/min (ref >60)
GFR calc non Af Amer: >60 mL/min (ref >60)
GLUCOSE: 107 mg/dL — AB (ref 70–99)
Potassium: 4.1 mmol/L (ref 3.5–5.1)
Sodium: 131 mmol/L — ABNORMAL LOW (ref 135–145)

## 2017-12-22 NOTE — Progress Notes (Signed)
   Subjective:  Patient reports pain as moderate.  No events.  Objective:   VITALS:   Vitals:   12/21/17 0803 12/21/17 1349 12/21/17 1949 12/22/17 0524  BP: 114/72 129/79 118/80 105/60  Pulse: (!) 101 92 98 92  Resp: 16 17 15 16   Temp:  98.2 F (36.8 C) (!) 97.4 F (36.3 C) 98.4 F (36.9 C)  TempSrc:  Oral Oral Oral  SpO2: 99% 100% 97% 100%  Weight:      Height:        Neurologically intact Neurovascular intact Sensation intact distally Intact pulses distally Dorsiflexion/Plantar flexion intact Incision: dressing C/D/I and no drainage No cellulitis present Compartment soft   Lab Results  Component Value Date   WBC 10.6 (H) 12/22/2017   HGB 7.9 (L) 12/22/2017   HCT 25.1 (L) 12/22/2017   MCV 88.4 12/22/2017   PLT 186 12/22/2017     Assessment/Plan:  2 Days Post-Op   - Expected postop acute blood loss anemia - Up with PT/OT - DVT ppx - SCDs, ambulation; continue to hold lovenox as Hgb is still trending down.  Can start aspirin 81 mg. - NWB operative extremity   Eduard Roux 12/22/2017, 9:02 AM [phone]

## 2017-12-22 NOTE — Progress Notes (Signed)
PROGRESS NOTE  AHLANI WICKES KVQ:[id number] DOB: 14-Dec-1927 DOA: 12/17/2017 PCP: [facility]  Brief Narrative: 84 year old woman PMH hypothyroidism TIA, GI bleed, paroxysmal atrial fibrillation, diastolic CHF presented after mechanical fall resulting in right hip pain.  Imaging revealed comminuted displaced mid distal femoral fracture.  Assessment/Plan Femur periprosthetic fracture secondary to mechanical fall --Stable postoperative day 2.  Up with therapy, nonweightbearing right leg, continue knee immobilizer.  Hold Lovenox today per orthopedics.  ABLA secondary to fracture.  Hemoglobin initially improved status post transfusion 2 units PRBC --Hemoglobin is somewhat lower today, no obvious bleeding.  Hold Lovenox.  Patient intolerant to aspirin  Paroxysmal atrial fibrillation, paroxysmal SVT.  Not currently on anticoagulation. See 10/3017 office note Dr. Doreene Nest.  Documented not being on Plavix.  Aspirin suggested and patient accepted.  Patient refused other anticoagulation. See also office note 07/12/17: "She is asymptomatic. We review her history of paroxysmal atrial fibrillation as well as CVA and resolved GI bleed. She continues to decline anticoagulation". Echocardiogram August 2016: LVEF 55-60%.  Normal wall motion.  No regional wall motion abnormalities.  Grade 1 diastolic dysfunction.  CHA2DS2-VASc 6. --Asymptomatic.  PMH TIA 2015, GI bleed 2018, paroxysmal atrial fibrillation 2015 --Refused Plavix on last office visit with PCP.    Hypothyroidism --Continue levothyroxine  Chronic diastolic CHF --Appears stable.  PMH TIA. --Patient refused Plavix but did agree to aspirin at last office visit.  Yet she has a listed intolerance to aspirin.  Glaucoma --Continue Xalatan, timolol   Hemoglobin continues to trend down.  Hold Lovenox.  Recheck CBC in a.m.  DVT prophylaxis: SCDs Code Status: Full Family Communication: none Disposition Plan:  pending PT eval post-surgery    Murray Hodgkins, MD  [facility] Direct contact: [phone] --[address]ia [place]  --[email or web address]; password [email or web address]  7PM-7AM contact night coverage as above 12/22/2017, 10:02 AM  LOS: 5 days   Consultants:  Orthopedics   Procedures:  Transfusion 2 units PRBC 11/27  11/29: Open reduction internal fixation of right supracondylar distal femur fracture with intercondylar extension.  Antimicrobials:    Interval history/Subjective: Some pain in feet, otherwise feeling okay.  Eating okay.  Objective: Vitals:  Vitals:   12/22/17 0524 12/22/17 0946  BP: 105/60   Pulse: 92   Resp: 16   Temp: 98.4 F (36.9 C)   SpO2: 100% 93%    Exam:  Constitutional:   . Appears calm and comfortable Respiratory:  . CTA bilaterally, no w/r/r.  . Respiratory effort normal.  Cardiovascular:  . RRR, no m/r/g . No significant LE extremity edema   . RLE, LLE   . Moves both legs to command Skin:  . No rashes, lesions, ulcers noted Psychiatric:  . Mental status o Mood, affect appropriate  I have personally reviewed the following:   Data: . Urine output 1400 . Sodium 131, remainder BMP unremarkable.  Hemoglobin trending down, 7.9.  WBC trending down, 10.6.  Platelets stable 186.  Scheduled Meds: . docusate sodium  100 mg Oral BID  . feeding supplement  1 Container Oral Q24H  . Influenza vac split quadrivalent PF  0.5 mL Intramuscular Tomorrow-1000  . latanoprost  1 drop Both Eyes QHS  . levothyroxine  88 mcg Oral Q0600  . multivitamin with minerals  1 tablet Oral Daily  . timolol  1 drop Both Eyes BID  . traMADol  50 mg Oral Q6H   Continuous Infusions: . lactated ringers 10 mL/hr at 12/20/17 1139    Principal Problem:  Femur fracture ([facility]) Active Problems:   Hypothyroidism   Paroxysmal A-fib ([facility])   Chronic diastolic CHF (congestive heart failure) (HCC)   Fall   HLD (hyperlipidemia)   Chronic anemia   Malnutrition of moderate  degree   Acute blood loss anemia   LOS: 5 days

## 2017-12-23 ENCOUNTER — Encounter (HOSPITAL_COMMUNITY): Payer: Self-pay | Admitting: Orthopaedic Surgery

## 2017-12-23 LAB — CBC
HCT: 24 % — ABNORMAL LOW (ref 36.0–46.0)
Hemoglobin: 7.5 g/dL — ABNORMAL LOW (ref 12.0–15.0)
MCH: 27.2 pg (ref 26.0–34.0)
MCHC: 31.3 g/dL (ref 30.0–36.0)
MCV: 87 fL (ref 80.0–100.0)
NRBC: 0 % (ref 0.0–0.2)
Platelets: 177 10*3/uL (ref 150–400)
RBC: 2.76 MIL/uL — ABNORMAL LOW (ref 3.87–5.11)
RDW: 14.7 % (ref 11.5–15.5)
WBC: 10.6 10*3/uL — ABNORMAL HIGH (ref 4.0–10.5)

## 2017-12-23 MED ORDER — ASPIRIN 81 MG PO CHEW
81.0000 mg | CHEWABLE_TABLET | Freq: Two times a day (BID) | ORAL | Status: DC
  Administered 2017-12-23 – 2017-12-24 (×2): 81 mg via ORAL
  Filled 2017-12-23 (×2): qty 1

## 2017-12-23 NOTE — Progress Notes (Signed)
PROGRESS NOTE  Stephanie Donovan DPO:[id number] DOB: May 14, 1927 DOA: 12/17/2017 PCP: [facility]  Brief Narrative: 84 year old woman PMH hypothyroidism TIA, GI bleed, paroxysmal atrial fibrillation, diastolic CHF presented after mechanical fall resulting in right hip pain.  Imaging revealed comminuted displaced mid distal femoral fracture.  Assessment/Plan Femur periprosthetic fracture secondary to mechanical fall --Stable, postoperative day 3.  Continue aqua therapy.  Nonweightbearing right leg.  Continue knee immobilizer.   --Hemoglobin somewhat lower, hopefully can start Lovenox tomorrow.  Aspirin today.  ABLA secondary to fracture.  Hemoglobin initially improved status post transfusion 2 units PRBC --Hemoglobin slightly lower, likely at nadir.  Start aspirin today.  Transition to Lovenox tomorrow if hemoglobin stable.  Paroxysmal atrial fibrillation, paroxysmal SVT.  Not currently on anticoagulation. See 10/3017 office note Dr. Doreene Nest.  Documented not being on Plavix.  Aspirin suggested and patient accepted.  Patient refused other anticoagulation. See also office note 07/12/17: "She is asymptomatic. We review her history of paroxysmal atrial fibrillation as well as CVA and resolved GI bleed. She continues to decline anticoagulation". Echocardiogram August 2016: LVEF 55-60%.  Normal wall motion.  No regional wall motion abnormalities.  Grade 1 diastolic dysfunction.  CHA2DS2-VASc 6. --Remains asymptomatic.  PMH TIA 2015, GI bleed 2018, paroxysmal atrial fibrillation 2015 --Refused Plavix on last office visit with PCP.  Okay with taking aspirin.  Hypothyroidism --Continue levothyroxine  Chronic diastolic CHF --Appears stable  PMH TIA. --Patient refused Plavix but did agree to aspirin at last office visit.  Yet she has a listed intolerance to aspirin.  Glaucoma --Continue Xalatan, timolol   Hopefully can start Lovenox tomorrow.  If hemoglobin  stable tomorrow, plan transfer to skilled nursing facility.  DVT prophylaxis: SCDs Code Status: Full Family Communication: none Disposition Plan: SNF   Murray Hodgkins, MD  [facility] Direct contact: [phone] --Via amion app OR  --[email or web address]; password [email or web address]  7PM-7AM contact night coverage as above 12/23/2017, 5:20 PM  LOS: 6 days   Consultants:  Orthopedics   Procedures:  Transfusion 2 units PRBC 11/27  11/29: Open reduction internal fixation of right supracondylar distal femur fracture with intercondylar extension.  Antimicrobials:    Interval history/Subjective: Overall feeling well.  Objective: Vitals:  Vitals:   12/23/17 0433 12/23/17 1326  BP: 111/62 108/61  Pulse: 99 85  Resp: 16 19  Temp: 98 F (36.7 C) 97.8 F (36.6 C)  SpO2: 93% 92%    Exam:  Constitutional:   . Appears calm and comfortable Respiratory:  . CTA bilaterally, no w/r/r.  . Respiratory effort normal.  Cardiovascular:  . RRR, no m/r/g . 1+ bilateral LE extremity edema   Musculoskeletal:  . Moves both extremities exam Psychiatric:  . Mental status o Mood, affect appropriate  I have personally reviewed the following:   Data: . Urine output 1250 . Hemoglobin slightly lower, 7.5.  Platelets slightly lower, 177.  Scheduled Meds: . aspirin  81 mg Oral BID  . docusate sodium  100 mg Oral BID  . feeding supplement  1 Container Oral Q24H  . Influenza vac split quadrivalent PF  0.5 mL Intramuscular Tomorrow-1000  . latanoprost  1 drop Both Eyes QHS  . levothyroxine  88 mcg Oral Q0600  . multivitamin with minerals  1 tablet Oral Daily  . timolol  1 drop Both Eyes BID  . traMADol  50 mg Oral Q6H   Continuous Infusions: . lactated ringers 10 mL/hr at 12/20/17 1139    Principal Problem:   Femur fracture ([place])  Active Problems:   Hypothyroidism   Paroxysmal A-fib (HCC)   Chronic diastolic CHF (congestive heart failure) (HCC)   Fall   HLD (hyperlipidemia)    Chronic anemia   Malnutrition of moderate degree   Acute blood loss anemia   LOS: 6 days

## 2017-12-23 NOTE — Evaluation (Signed)
Occupational Therapy Evaluation Patient Details Name: Stephanie Donovan MRN: [id number] DOB: 03/24/1927 Today's Date: 12/23/2017    History of Present Illness 84 year old woman PMH hypothyroidism TIA, GI bleed, paroxysmal atrial fibrillation, diastolic CHF presented after mechanical fall resulting in right hip pain.  Imaging revealed comminuted displaced mid distal femoral fracture.  ORIF on Right femur on 12/20/17.  PMH significant for R proximal humerus fx, s/p reverse TSA 09/2017, TKR 10/2011, Right hip hemiarthroplasty secondary to fall/fx 09/2017, TIA (2015), syncope.    Clinical Impression   This 84 y/o female presents with the above. PTA pt reports she was using [facility] for functional mobility and receiving intermittent assist from daughter for ADLs. Pt with increased lethargy this session, requiring increased cues and time for participation. Pt requiring minA for bed mobility and minguard assist for sitting balance EOB. She currently requires minA for UB ADL, maxA for LB ADLs. Pt will benefit from continued acute OT services and recommend follow up therapy services in SNF setting after discharge to maximize her overall safety and independence with ADLs and mobility. Will follow.     Follow Up Recommendations  SNF;Supervision/Assistance - 24 hour    Equipment Recommendations  Other (comment)(TBD in next venue)           Precautions / Restrictions Precautions Precautions: Fall Required Braces or Orthoses: Knee Immobilizer - Right Knee Immobilizer - Right: On at all times Restrictions Weight Bearing Restrictions: Yes RLE Weight Bearing: Non weight bearing Other Position/Activity Restrictions: need to clarify weight bearing on Right UE due to recent reverse TSA      Mobility Bed Mobility Overal bed mobility: Needs Assistance Bed Mobility: Supine to Sit;Sit to Supine     Supine to sit: Min assist Sit to supine: Min assist   General bed mobility comments: assist for RLE  management and cues for technique; cues and assist to scoot hips towards EOB  Transfers                      Balance Overall balance assessment: Needs assistance Sitting-balance support: No upper extremity supported;Feet supported Sitting balance-Leahy Scale: Fair                                     ADL either performed or assessed with clinical judgement   ADL Overall ADL's : Needs assistance/impaired Eating/Feeding: Set up;Sitting   Grooming: Set up;Min guard;Sitting   Upper Body Bathing: Minimal assistance;Sitting   Lower Body Bathing: Maximal assistance;Sitting/lateral leans   Upper Body Dressing : Minimal assistance;Sitting   Lower Body Dressing: Maximal assistance;Sitting/lateral leans                                           Pertinent Vitals/Pain Pain Assessment: Faces Faces Pain Scale: Hurts little more Pain Location: right LE Pain Descriptors / Indicators: Discomfort;Guarding Pain Intervention(s): Limited activity within patient's tolerance;Monitored during session;Repositioned     Hand Dominance Right   Extremity/Trunk Assessment Upper Extremity Assessment Upper Extremity Assessment: RUE deficits/detail;Generalized weakness RUE Deficits / Details: pt with recent reverse R TSA, tends to maintain RUE in "sling position" during session, though reports she has not been wearing sling as of recent, maintained NWB for increased safety during this session until able to get further clarification on WB and ROM   Lower Extremity  Assessment Lower Extremity Assessment: Defer to PT evaluation       Communication Communication Communication: No difficulties   Cognition Arousal/Alertness: Awake/alert;Lethargic Behavior During Therapy: WFL for tasks assessed/performed Overall Cognitive Status: No family/caregiver present to determine baseline cognitive functioning                                 General Comments:  pt with some confusion and inconsistent answers regarding PLOF; pt also intermittently dozing off during session and requires cues to remain awake so this may have added to confusion, will continue to assess   General Comments       Exercises     Shoulder Instructions      Home Living Family/patient expects to be discharged to:: Skilled nursing facility Living Arrangements: Children(lives with daughter)                               Additional Comments: prior to admission was living with daughter in 1-level home; her home is a 2 story townhome.      Prior Functioning/Environment Level of Independence: Independent with assistive device(s);Needs assistance  Gait / Transfers Assistance Needed: used cane for ambulation ADL's / Homemaking Assistance Needed: reports intermittent assist from daughter for ADLs   Comments: pt with some inconsistencies during her report of PLOF, clarification obtained via chart review        OT Problem List: Decreased strength;Decreased range of motion;Decreased activity tolerance;Impaired balance (sitting and/or standing);Decreased safety awareness;Pain      OT Treatment/Interventions: Self-care/ADL training;Therapeutic exercise;DME and/or AE instruction;Patient/family education;Balance training    OT Goals(Current goals can be found in the care plan section) Acute Rehab OT Goals Patient Stated Goal: go back home and stop falling OT Goal Formulation: With patient Time For Goal Achievement: 01/06/18 Potential to Achieve Goals: Good  OT Frequency: Min 2X/week   Barriers to D/C:            Co-evaluation              AM-PAC OT "6 Clicks" Daily Activity     Outcome Measure Help from another person eating meals?: None Help from another person taking care of personal grooming?: A Little Help from another person toileting, which includes using toliet, bedpan, or urinal?: Total Help from another person bathing (including washing,  rinsing, drying)?: A Lot Help from another person to put on and taking off regular upper body clothing?: A Little Help from another person to put on and taking off regular lower body clothing?: Total 6 Click Score: 14   End of Session Equipment Utilized During Treatment: Right knee immobilizer Nurse Communication: Mobility status  Activity Tolerance: Patient tolerated treatment well;Patient limited by lethargy Patient left: in bed;with call bell/phone within reach;with bed alarm set  OT Visit Diagnosis: Other abnormalities of gait and mobility (R26.89);History of falling (Z91.81)                Time: 1350-1404 OT Time Calculation (min): 14 min Charges:  OT General Charges $OT Visit: 1 Visit OT Evaluation $OT Eval Moderate Complexity: [place], OT [employer] Pager [phone] Office [phone]   Raymondo Band 12/23/2017, 4:47 PM

## 2017-12-23 NOTE — Progress Notes (Signed)
Patient ID: Stephanie Donovan, female   DOB: Sep 24, 1927, 84 y.o.   MRN: [id number] No acute changes.  Tolerated her surgery very well on her right leg.  Does have acute blood loss anemia, but vitals are stable.  I changed her right lower extremity dressing and her incision looks good.  Can continue her current blood thinning meds.  Will continue to be non-weight bearing on her right LE for the next 6 weeks.

## 2017-12-23 NOTE — Plan of Care (Signed)
  Problem: Pain Managment: Goal: General experience of comfort will improve Outcome: Progressing   Problem: Safety: Goal: Ability to remain free from injury will improve Outcome: Progressing   Problem: Skin Integrity: Goal: Risk for impaired skin integrity will decrease Outcome: Progressing   

## 2017-12-23 NOTE — Discharge Instructions (Signed)
No weight at all on your right leg until further notice. Keep your incision clean and dry; new dry dressing as needed. The current right lower extremity dressing can stay on for two weeks and can get wet.

## 2017-12-24 DIAGNOSIS — S72401A Unspecified fracture of lower end of right femur, initial encounter for closed fracture: Secondary | ICD-10-CM | POA: Diagnosis present

## 2017-12-24 DIAGNOSIS — E440 Moderate protein-calorie malnutrition: Secondary | ICD-10-CM

## 2017-12-24 DIAGNOSIS — W19XXXD Unspecified fall, subsequent encounter: Secondary | ICD-10-CM

## 2017-12-24 DIAGNOSIS — S72401D Unspecified fracture of lower end of right femur, subsequent encounter for closed fracture with routine healing: Secondary | ICD-10-CM

## 2017-12-24 MED ORDER — ASPIRIN 81 MG PO CHEW: 81.0000 mg | CHEWABLE_TABLET | Freq: Two times a day (BID) | ORAL | 0 refills | Status: DC

## 2017-12-24 MED ORDER — DOCUSATE SODIUM 100 MG PO CAPS: 100.0000 mg | ORAL_CAPSULE | Freq: Two times a day (BID) | ORAL | 0 refills | Status: DC

## 2017-12-24 MED ORDER — BOOST BREEZE PO LIQD: 1.0000 | Freq: Three times a day (TID) | ORAL | 0 refills | Status: DC

## 2017-12-24 MED ORDER — ADULT MULTIVITAMIN W/MINERALS CH: 1.0000 | ORAL_TABLET | Freq: Every day | ORAL | 0 refills | Status: DC

## 2017-12-24 MED ORDER — HYDROCODONE-ACETAMINOPHEN 5-325 MG PO TABS: 1.0000 | ORAL_TABLET | ORAL | 0 refills | Status: DC | PRN

## 2017-12-24 MED ORDER — METHOCARBAMOL 500 MG PO TABS: 500.0000 mg | ORAL_TABLET | Freq: Three times a day (TID) | ORAL | 0 refills | Status: DC | PRN

## 2017-12-24 MED ORDER — BISACODYL 10 MG RE SUPP
10.0000 mg | Freq: Once | RECTAL | Status: AC
  Administered 2017-12-24: 10 mg via RECTAL
  Filled 2017-12-24: qty 1

## 2017-12-24 NOTE — Clinical Social Work Placement (Signed)
   CLINICAL SOCIAL WORK PLACEMENT  NOTE  Date:  12/24/2017  Patient Details  Name: Stephanie Donovan MRN: [id number] Date of Birth: 07/14/1927  Clinical Social Work is seeking post-discharge placement for this patient at the [place] level of care (*CSW will initial, date and re-position this form in  chart as items are completed):      Patient/family provided with [place] Work Department's list of facilities offering this level of care within the geographic area requested by the patient (or if unable, by the patient's family).  Yes   Patient/family informed of their freedom to choose among providers that offer the needed level of care, that participate in Medicare, Medicaid or managed care program needed by the patient, have an available bed and are willing to accept the patient.      Patient/family informed of [facility]'s ownership interest in [facility] and [facility], as well as of the fact that they are under no obligation to receive care at these facilities.  PASRR submitted to EDS on       PASRR number received on 12/21/17     Existing PASRR number confirmed on       FL2 transmitted to all facilities in geographic area requested by pt/family on 12/21/17     FL2 transmitted to all facilities within larger geographic area on       Patient informed that his/her managed care company has contracts with or will negotiate with certain facilities, including the following:        Yes   Patient/family informed of bed offers received.  Patient chooses bed at [facility]     Physician recommends and patient chooses bed at      Patient to be transferred to [facility] on 12/24/17.  Patient to be transferred to facility by [facility]     Patient family notified on 12/24/17 of transfer.  Name of family member notified:  Pricilla Riffle, LCSW, [place]     PHYSICIAN       Additional Comment:     _______________________________________________ Alberteen Sam, LCSW 12/24/2017, 12:23 PM

## 2017-12-24 NOTE — Progress Notes (Signed)
Patient will DC to: [facility] Anticipated DC date: 12/24/17 Family notified: none identified to notify per patient Transport by: Corey Harold  Per MD patient ready for DC to [facility]. RN, patient, patient's family, and facility notified of DC. Discharge Summary sent to facility. RN given number for report [phone]. DC packet on chart. Ambulance transport requested for patient.  CSW signing off.  [place], [place]

## 2017-12-24 NOTE — Discharge Summary (Signed)
Physician Discharge Summary  Stephanie Donovan [id number]:[id number] DOB: [birth year] DOA: 12/17/2017  [facility] date: 12/17/2017 Discharge date: 12/24/2017  Admitted From: Home Disposition: Skilled nursing facility  Recommendations for Outpatient Follow-up:  1. Follow up with MD at SNF in 1 week.  Please monitor H&H in next 3-4 days. 2. Follow-up with orthopedics (Dr Ninfa Linden) in 2 weeks.  Patient is being discharged on baby aspirin 81 mg twice daily for at least 4 weeks for DVT prophylaxis. Instructed for nonweightbearing on the right leg until evaluated by orthopedics as outpatient.  Keep right lower extremity dressing for 2 weeks.  Home Health: None Equipment/Devices: As per therapy at the facility.   Discharge Condition: Fair CODE STATUS: Full code Diet recommendation: Heart healthy   Discharge Diagnoses:  Principal Problem:   Closed fracture of right distal femur ([place])  Active Problems:   Hypothyroidism   Paroxysmal A-fib (HCC)   Chronic diastolic CHF (congestive heart failure) (HCC)   Femur fracture (HCC)   Fall   HLD (hyperlipidemia)   Chronic anemia   Malnutrition of moderate degree   Acute blood loss anemia   Brief narrative/ HPI Please refer to admission H&P for details, in brief, 84 y/o female with HTN, TIA, paroxysmal afib not on anticoagulation due to hx og GI bleed, and diastolic CHF presenting with mechanical fall and right hip pain with findings of comminuted displaced mild distal right femoral fracture.  Patient underwent open reduction internal fixation of right supracondylar distal fracture with intercondylar extension on 11/29.  Hospital course Right femur periprosthetic fracture Secondary to mechanical fall.  Postoperative day 4.  Nonweightbearing on right leg until further evaluated and recommended by orthopedics as outpatient, in 2 weeks.  Continue knee immobilizer. Resume Plavix.  Aspirin 81 mg twice daily  for DVT prophylaxis for 4 weeks (patient agreed with this). Pain control with Vicodin as needed and Robaxin for muscle spasms. Added scheduled bowel regimen (had not had bowel movement since admission and given Dulcolax suppository this morning). PT recommends SNF.  Stable medically for discharge.  Acute blood loss anemia Secondary to fracture and surgery. Received 2 unit PRBC this admission.  Plavix resumed on discharge and baby aspirin has been started for DVT prophylaxis.  Monitor H&H as outpatient.  Paroxysmal A. fib/SVT Reportedly was on Lovenox in the past but has not been on it for few months and refuses to be on any anticoagulation. Cardiologist note from office reports that patient has declined anticoagulation and also not on Plavix.  Her CHADS2VASC is 6.  Agree to be on baby aspirin and has been placed on 81 mg twice daily.  History of TIA/CVA GI bleed and paroxysmal A. fib Refused Plavix has and agreed to be on baby aspirin which has been started.   Also refused to be on statin  Hypothyroidism Continue Synthroid  Chronic diastolic CHF Euvolemic.  History of glaucoma Continue her eyedrops.   Procedure: Right femur ORIF Consults: [facility] (Dr. Ninfa Linden) Disposition: SNF Family communication: None at bedside  Discharge Instructions   Allergies as of 12/24/2017      Reactions   Aspirin Palpitations, Other (See Comments)   Unspecified Mixed reactions   Penicillins Other (See Comments)   Lost hearing temporarily   Codeine Palpitations      Medication List    STOP taking these medications   atorvastatin 20 MG tablet Commonly known as:  LIPITOR   celecoxib 100 MG capsule Commonly known as:  CELEBREX  enoxaparin 40 MG/0.4ML injection Commonly known as:  LOVENOX   oxyCODONE 5 MG immediate release tablet Commonly known as:  Oxy IR/ROXICODONE clopidogrel 75 MG tablet Commonly known as:  PLAVIX     TAKE these medications   aspirin 81 MG  chewable tablet Chew 1 tablet (81 mg total) by mouth 2 (two) times daily.      docusate sodium 100 MG capsule Commonly known as:  COLACE Take 1 capsule (100 mg total) by mouth 2 (two) times daily.   feeding supplement (BOOST BREEZE) Liqd Take 1 Can by mouth 3 (three) times daily.   HYDROcodone-acetaminophen 5-325 MG tablet Commonly known as:  NORCO/VICODIN Take 1-2 tablets by mouth every 4 (four) hours as needed for moderate pain (pain score 4-6).   latanoprost 0.005 % ophthalmic solution Commonly known as:  XALATAN Place 1 drop into both eyes at bedtime.   levothyroxine 88 MCG tablet Commonly known as:  SYNTHROID, LEVOTHROID Take 88 mcg by mouth daily.   methocarbamol 500 MG tablet Commonly known as:  ROBAXIN Take 1 tablet (500 mg total) by mouth every 8 (eight) hours as needed for muscle spasms.   multivitamin with minerals Tabs tablet Take 1 tablet by mouth daily.   polyethylene glycol packet Commonly known as:  MIRALAX / GLYCOLAX Take 17 g by mouth daily as needed for mild constipation.   timolol 0.5 % ophthalmic solution Commonly known as:  TIMOPTIC Place 1 drop into both eyes 2 (two) times daily.      Follow-up Information    [name], MD. Schedule an appointment as soon as possible for a visit in 2 week(s).   Specialty:  Orthopedic Surgery Contact information: [address] [place] [address] [phone]          Allergies  Allergen Reactions  . Aspirin Palpitations and Other (See Comments)    Unspecified Mixed reactions  . Penicillins Other (See Comments)    Lost hearing temporarily  . Codeine Palpitations        Procedures/Studies: Dg Knee Right Port  Result Date: 12/20/2017 CLINICAL DATA:  Post right femoral fracture fixation. EXAM: PORTABLE RIGHT KNEE - 1-2 VIEW COMPARISON:  12/17/2017 FINDINGS: Evidence of the stump of patient's right hip arthroplasty. There has been internal fixation of patient's distal  femoral fracture with lateral fixation plate and multiple screws. Hardware is intact as there is near anatomic alignment over the fracture site. There are moderate degenerative changes of the right knee. Skin staples are present over the lateral soft tissues of the knee. Surgical drain is present adjacent the fracture site. IMPRESSION: Post internal fixation of distal femoral fracture with hardware intact and near anatomic alignment over the fracture site. Surgical drain present. Degenerative change of the right knee. Electronically Signed   By: Marin Olp M.D.   On: 12/20/2017 16:51   Dg C-arm 1-60 Min  Result Date: 12/20/2017 CLINICAL DATA:  ORIF. EXAM: DG C-ARM 61-120 MIN; RIGHT FEMUR 2 VIEWS COMPARISON:  12/17/2017. FINDINGS: ORIF right femur. Hardware intact. Slight medial displacement of distal fracture fragment 5 images obtained. 2 minutes 26 seconds fluoroscopy time utilized. IMPRESSION: ORIF right femur. Electronically Signed   By: Marcello Moores  Register   On: 12/20/2017 15:24   Dg C-arm 1-60 Min  Result Date: 12/20/2017 CLINICAL DATA:  ORIF. EXAM: DG C-ARM 61-120 MIN; RIGHT FEMUR 2 VIEWS COMPARISON:  12/17/2017. FINDINGS: ORIF right femur. Hardware intact. Slight medial displacement of distal fracture fragment 5 images obtained. 2 minutes 26 seconds fluoroscopy time  utilized. IMPRESSION: ORIF right femur. Electronically Signed   By: Marcello Moores  Register   On: 12/20/2017 15:24   Dg Hip Unilat W Or Wo Pelvis 1 View Right  Result Date: 12/17/2017 CLINICAL DATA:  Trip and fall walking to the bathroom tonight with right hip and leg pain. EXAM: DG HIP (WITH OR WITHOUT PELVIS) 1V RIGHT COMPARISON:  Pelvis radiograph 09/22/2017 FINDINGS: Unipolar right hip arthroplasty in expected alignment. No periprosthetic fracture. Femoral shaft fractures partially included. Remote left inferior pubic ramus fracture. Bones are diffusely under mineralized. Calcified pelvic lymph nodes again seen. IMPRESSION: Femoral  shaft fracture partially included. Right hip arthroplasty is intact without periprosthetic involvement or additional acute fracture of the pelvis. Electronically Signed   By: Keith Rake M.D.   On: 12/17/2017 01:33   Dg Femur, Min 2 Views Right  Result Date: 12/20/2017 CLINICAL DATA:  ORIF. EXAM: DG C-ARM 61-120 MIN; RIGHT FEMUR 2 VIEWS COMPARISON:  12/17/2017. FINDINGS: ORIF right femur. Hardware intact. Slight medial displacement of distal fracture fragment 5 images obtained. 2 minutes 26 seconds fluoroscopy time utilized. IMPRESSION: ORIF right femur. Electronically Signed   By: Marcello Moores  Register   On: 12/20/2017 15:24   Dg Femur Min 2 Views Right  Result Date: 12/17/2017 CLINICAL DATA:  Trip and fall walking to the bathroom tonight with right hip and leg pain. EXAM: RIGHT FEMUR 2 VIEWS COMPARISON:  Right hip radiograph 09/22/2017 FINDINGS: Right hip arthroplasty in expected alignment. No periprosthetic lucency. Comminuted fracture of the mid distal femoral shaft extending into the femoral metaphysis. There is intra-articular extension to the intercondylar notch, not well assessed on full field of view femur x-ray. Medial compartment osteoarthritis of the knee. The bones are diffusely under mineralized. IMPRESSION: Comminuted displaced mid distal femoral shaft fracture extending into the metaphysis. Nondisplaced extension into the knee joint in the intercondylar notch. Electronically Signed   By: Keith Rake M.D.   On: 12/17/2017 01:31       Subjective: Reports hip pain to be controlled with pain meds.  Has not had bowel movement for several days.  Discharge Exam: Vitals:   12/24/17 0053 12/24/17 0351  BP: 122/70 121/71  Pulse: 86 91  Resp: 14 14  Temp: 98.5 F (36.9 C) 98.3 F (36.8 C)  SpO2: 91% 92%   Vitals:   12/23/17 1326 12/23/17 2020 12/24/17 0053 12/24/17 0351  BP: 108/61 117/64 122/70 121/71  Pulse: 85 (!) 101 86 91  Resp: 19 18 14 14   Temp: 97.8 F (36.6 C)  98.8 F (37.1 C) 98.5 F (36.9 C) 98.3 F (36.8 C)  TempSrc:  Oral Oral Oral  SpO2: 92% 92% 91% 92%  Weight:      Height:        General: Not in distress HEENT: Pallor present, moist mucosa, supple neck Chest: Clear bilaterally CVS: Normal S1-S2, no murmurs GI: Soft, nondistended, nontender Musculoskeletal: Warm, dressing over right femur, knee immobilizer in place, moving her lower extremity and has normal sensation. CNS: Alert and oriented    The results of significant diagnostics from this hospitalization (including imaging, microbiology, ancillary and laboratory) are listed below for reference.     Microbiology: Recent Results (from the past 240 hour(s))  Surgical pcr screen     Status: None   Collection Time: 12/17/17  7:03 AM  Result Value Ref Range Status   MRSA, PCR NEGATIVE NEGATIVE Final   Staphylococcus aureus NEGATIVE NEGATIVE Final    Comment: (NOTE) The Xpert SA Assay (FDA approved for NASAL  specimens in patients 49 years of age and older), is one component of a comprehensive surveillance program. It is not intended to diagnose infection nor to guide or monitor treatment. Performed at [facility], [place] [address], [place], [place] [address]      Labs: BNP (last 3 results) No results for input(s): BNP in the last 8760 hours. Basic Metabolic Panel: Recent Labs  Lab 12/17/17 1125 12/18/17 0858 12/21/17 0443 12/22/17 0710  NA  --  132* 134* 131*  K  --  4.1 4.4 4.1  CL  --  99 96* 93*  CO2  --  27 28 28   GLUCOSE  --  118* 124* 107*  BUN  --  12 16 17   CREATININE  --  0.57 0.61 0.71  CALCIUM  --  8.1* 8.2* 8.1*  MG 1.8  --   --   --    Liver Function Tests: No results for input(s): AST, ALT, ALKPHOS, BILITOT, PROT, ALBUMIN in the last 168 hours. No results for input(s): LIPASE, AMYLASE in the last 168 hours. No results for input(s): AMMONIA in the last 168 hours. CBC: Recent Labs  Lab 12/20/17 0130 12/20/17 1841  12/21/17 0443 12/22/17 0710 12/23/17 0316  WBC 9.0 16.6* 11.6* 10.6* 10.6*  HGB 9.6* 10.5* 9.1* 7.9* 7.5*  HCT 30.4* 32.4* 28.6* 25.1* 24.0*  MCV 87.6 85.9 87.2 88.4 87.0  PLT 190 189 194 186 177   Cardiac Enzymes: No results for input(s): CKTOTAL, CKMB, CKMBINDEX, TROPONINI in the last 168 hours. BNP: Invalid input(s): POCBNP CBG: No results for input(s): GLUCAP in the last 168 hours. D-Dimer No results for input(s): DDIMER in the last 72 hours. Hgb A1c No results for input(s): HGBA1C in the last 72 hours. Lipid Profile No results for input(s): CHOL, HDL, LDLCALC, TRIG, CHOLHDL, LDLDIRECT in the last 72 hours. Thyroid function studies No results for input(s): TSH, T4TOTAL, T3FREE, THYROIDAB in the last 72 hours.  Invalid input(s): FREET3 Anemia work up No results for input(s): VITAMINB12, FOLATE, FERRITIN, TIBC, IRON, RETICCTPCT in the last 72 hours. Urinalysis    Component Value Date/Time   COLORURINE YELLOW 12/20/2017 1306   APPEARANCEUR HAZY (A) 12/20/2017 1306   LABSPEC 1.020 12/20/2017 1306   PHURINE 6.0 12/20/2017 1306   GLUCOSEU NEGATIVE 12/20/2017 1306   HGBUR NEGATIVE 12/20/2017 1306   BILIRUBINUR NEGATIVE 12/20/2017 1306   KETONESUR NEGATIVE 12/20/2017 1306   PROTEINUR NEGATIVE 12/20/2017 1306   UROBILINOGEN 0.2 08/25/2014 0454   NITRITE NEGATIVE 12/20/2017 1306   LEUKOCYTESUR NEGATIVE 12/20/2017 1306   Sepsis Labs Invalid input(s): PROCALCITONIN,  WBC,  LACTICIDVEN Microbiology Recent Results (from the past 240 hour(s))  Surgical pcr screen     Status: None   Collection Time: 12/17/17  7:03 AM  Result Value Ref Range Status   MRSA, PCR NEGATIVE NEGATIVE Final   Staphylococcus aureus NEGATIVE NEGATIVE Final    Comment: (NOTE) The Xpert SA Assay (FDA approved for NASAL specimens in patients 5 years of age and older), is one component of a comprehensive surveillance program. It is not intended to diagnose infection nor to guide or monitor  treatment. Performed at [facility], [place] [address], [place], [place] [address]      Time coordinating discharge: 35 minutes  SIGNED:   Louellen Molder, MD  [facility] 12/24/2017, 8:40 AM Pager   If 7PM-7AM, please contact night-coverage [email or web address] Password [email or web address]

## 2017-12-24 NOTE — Clinical Social Work Note (Signed)
Clinical Social Work Assessment  Patient Details  Name: Stephanie Donovan MRN: [id number] Date of Birth: [birth year]  Date of referral:  12/24/17               Reason for consult:  Discharge Planning                Permission sought to share information with:  Case Manager, Facility Sport and exercise psychologist, Family Supports Permission granted to share information::  Yes, Verbal Permission Granted  Name::     [place]::  SNFs  Relationship::  daughter  Contact Information:  [phone]  Housing/Transportation Living arrangements for the past 2 months:  [place] of Information:  Patient Patient Interpreter Needed:  None Criminal Activity/Legal Involvement Pertinent to Current Situation/Hospitalization:  No - Comment as needed Significant Relationships:  Adult Children Lives with:  Self Do you feel safe going back to the place where you live?  No Need for family participation in patient care:  No (Coment)  Care giving concerns:  CSW received referral for possible SNF placement at time of discharge. Spoke with patient regarding possibility of SNF placement . Patient's  daughter  is currently unable to care for her at their home given patient's current needs and fall risk.  Patient  expressed understanding of PT recommendation and are agreeable to SNF placement at time of discharge. CSW to continue to follow and assist with discharge planning needs.     Social Worker assessment / plan:  Spoke with patient concerning possibility of rehab at SNF before returning home.    Employment status:  Retired Forensic scientist:  Medicare PT Recommendations:  [place] / Referral to community resources:  [place]  Patient/Family's Response to care: Patient recognize need for rehab before returning home and are agreeable to a SNF in [place]. They report preference for  [facility] or [facility] . CSW was given  permission by patient to contact daughter, daughter was contacted and left voicemail however has not bee in contact with CSW. No family involved in patient care or discharge planning at this time. CSW explained insurance authorization process to patient.    Patient/Family's Understanding of and Emotional Response to Diagnosis, Current Treatment, and Prognosis:  Patient is realistic regarding therapy needs and expressed being hopeful for SNF placement. Patient expressed understanding of CSW role and discharge process as well as medical condition. No questions/concerns about plan or treatment.    Emotional Assessment Appearance:  Appears stated age Attitude/Demeanor/Rapport:  Gracious Affect (typically observed):  Accepting, Adaptable Orientation:  Oriented to Self, Oriented to Place, Oriented to  Time, Oriented to Situation Alcohol / Substance use:  Not Applicable Psych involvement (Current and /or in the community):  No (Comment)  Discharge Needs  Concerns to be addressed:  Discharge Planning Concerns Readmission within the last 30 days:  No Current discharge risk:  Dependent with Mobility Barriers to Discharge:  Continued Medical Work up   [employer], LCSW 12/24/2017, 10:23 AM

## 2017-12-24 NOTE — Care Management Important Message (Signed)
Important Message  Patient Details  Name: Stephanie Donovan MRN: [id number] Date of Birth: 08-14-1927   Medicare Important Message Given:  Yes    [name] [address] 12/24/2017, 9:18 AM

## 2017-12-24 NOTE — Progress Notes (Signed)
BM today. PTAR at bedside. Report given. Rx inside packet. IV DCd. Tolerated well. Attempted to call facility twice, first was an unanswered "Hold", and second went to voicemail but inbox was full.

## 2018-01-01 ENCOUNTER — Ambulatory Visit (INDEPENDENT_AMBULATORY_CARE_PROVIDER_SITE_OTHER): Payer: Medicare Other | Admitting: Orthopaedic Surgery

## 2018-01-01 ENCOUNTER — Encounter (INDEPENDENT_AMBULATORY_CARE_PROVIDER_SITE_OTHER): Payer: Self-pay | Admitting: Orthopaedic Surgery

## 2018-01-01 ENCOUNTER — Ambulatory Visit (INDEPENDENT_AMBULATORY_CARE_PROVIDER_SITE_OTHER): Payer: Medicare Other

## 2018-01-01 DIAGNOSIS — S72001A Fracture of unspecified part of neck of right femur, initial encounter for closed fracture: Secondary | ICD-10-CM

## 2018-01-01 DIAGNOSIS — S72451A Displaced supracondylar fracture without intracondylar extension of lower end of right femur, initial encounter for closed fracture: Secondary | ICD-10-CM | POA: Insufficient documentation

## 2018-01-01 NOTE — Progress Notes (Signed)
The patient is a 84 year old female is in follow-up 12 days status post fixation of a complex distal femur fracture.  This is below a hip prosthesis.  This was comminution of the femoral shaft and had extension into the knee joint itself in multiple pieces.  She has been convalescing in a skilled nursing facility.  We have had her nonweightbearing and in a knee immobilizer.  On examination today her staple line looks good with staples removed and Steri-Strips applied.  Her calf is soft.  Her foot is well-perfused.  X-rays of the distal femur show intact hardware.  Bone graft material can be seen.  There is severe osteoarthritis of her right knee on that operative side.  The overall alignment is well-maintained.  There is no evidence of hardware failure.  She understands that we cannot let her put weight on this for a minimum of 4 more weeks.  Even then we may only advance her to partial weightbearing.  She will stop the knee immobilizer in 2 weeks and can be done with that completely.  In 4 weeks from now we will get a repeat 2 views of her right distal femur.

## 2018-01-06 ENCOUNTER — Other Ambulatory Visit: Payer: Self-pay

## 2018-01-06 ENCOUNTER — Emergency Department (HOSPITAL_COMMUNITY)
Admission: EM | Admit: 2018-01-06 | Discharge: 2018-01-06 | Disposition: A | Discharge status: Home / Self Care | Payer: Medicare Other | Attending: Emergency Medicine | Admitting: Emergency Medicine

## 2018-01-06 ENCOUNTER — Encounter (HOSPITAL_COMMUNITY): Payer: Self-pay | Admitting: Emergency Medicine

## 2018-01-06 DIAGNOSIS — R531 Weakness: Secondary | ICD-10-CM | POA: Diagnosis not present

## 2018-01-06 DIAGNOSIS — E039 Hypothyroidism, unspecified: Secondary | ICD-10-CM | POA: Insufficient documentation

## 2018-01-06 DIAGNOSIS — Z7982 Long term (current) use of aspirin: Secondary | ICD-10-CM | POA: Insufficient documentation

## 2018-01-06 DIAGNOSIS — Z8673 Personal history of transient ischemic attack (TIA), and cerebral infarction without residual deficits: Secondary | ICD-10-CM | POA: Insufficient documentation

## 2018-01-06 DIAGNOSIS — Z96641 Presence of right artificial hip joint: Secondary | ICD-10-CM | POA: Insufficient documentation

## 2018-01-06 DIAGNOSIS — Z96652 Presence of left artificial knee joint: Secondary | ICD-10-CM | POA: Insufficient documentation

## 2018-01-06 DIAGNOSIS — N390 Urinary tract infection, site not specified: Secondary | ICD-10-CM | POA: Insufficient documentation

## 2018-01-06 DIAGNOSIS — Z79899 Other long term (current) drug therapy: Secondary | ICD-10-CM | POA: Insufficient documentation

## 2018-01-06 DIAGNOSIS — I5032 Chronic diastolic (congestive) heart failure: Secondary | ICD-10-CM | POA: Insufficient documentation

## 2018-01-06 LAB — COMPREHENSIVE METABOLIC PANEL
ALT: 11 U/L (ref 0–44)
AST: 15 U/L (ref 15–41)
Albumin: 2.1 g/dL — ABNORMAL LOW (ref 3.5–5.0)
Alkaline Phosphatase: 96 U/L (ref 38–126)
Anion gap: 8 (ref 5–15)
BUN: 8 mg/dL (ref 8–23)
CO2: 30 mmol/L (ref 22–32)
Calcium: 8.4 mg/dL — ABNORMAL LOW (ref 8.9–10.3)
Chloride: 98 mmol/L (ref 98–111)
Creatinine, Ser: 0.51 mg/dL (ref 0.44–1.00)
GFR calc Af Amer: >60 mL/min (ref >60)
GFR calc non Af Amer: >60 mL/min (ref >60)
GLUCOSE: 107 mg/dL — AB (ref 70–99)
Potassium: 3.8 mmol/L (ref 3.5–5.1)
Sodium: 136 mmol/L (ref 135–145)
Total Bilirubin: 0.5 mg/dL (ref 0.3–1.2)
Total Protein: 6 g/dL — ABNORMAL LOW (ref 6.5–8.1)

## 2018-01-06 LAB — CBC WITH DIFFERENTIAL/PLATELET
Abs Immature Granulocytes: 0.02 10*3/uL (ref 0.00–0.07)
Basophils Absolute: 0.1 10*3/uL (ref 0.0–0.1)
Basophils Relative: 1 %
Eosinophils Absolute: 0.1 10*3/uL (ref 0.0–0.5)
Eosinophils Relative: 1 %
HCT: 29.4 % — ABNORMAL LOW (ref 36.0–46.0)
Hemoglobin: 8.5 g/dL — ABNORMAL LOW (ref 12.0–15.0)
IMMATURE GRANULOCYTES: 0 %
Lymphocytes Relative: 11 %
Lymphs Abs: 0.8 10*3/uL (ref 0.7–4.0)
MCH: 26.3 pg (ref 26.0–34.0)
MCHC: 28.9 g/dL — ABNORMAL LOW (ref 30.0–36.0)
MCV: 91 fL (ref 80.0–100.0)
MONOS PCT: 9 %
Monocytes Absolute: 0.7 10*3/uL (ref 0.1–1.0)
NEUTROS PCT: 78 %
Neutro Abs: 5.6 10*3/uL (ref 1.7–7.7)
Platelets: 452 10*3/uL — ABNORMAL HIGH (ref 150–400)
RBC: 3.23 MIL/uL — ABNORMAL LOW (ref 3.87–5.11)
RDW: 15.7 % — ABNORMAL HIGH (ref 11.5–15.5)
WBC: 7.2 10*3/uL (ref 4.0–10.5)
nRBC: 0 % (ref 0.0–0.2)

## 2018-01-06 LAB — URINALYSIS, ROUTINE W REFLEX MICROSCOPIC
Bilirubin Urine: NEGATIVE
Glucose, UA: NEGATIVE mg/dL
Ketones, ur: NEGATIVE mg/dL
Nitrite: POSITIVE — AB
PH: 7 (ref 5.0–8.0)
Protein, ur: NEGATIVE mg/dL
Specific Gravity, Urine: 1.014 (ref 1.005–1.030)
WBC, UA: >50 WBC/hpf — ABNORMAL HIGH (ref 0–5)

## 2018-01-06 LAB — I-STAT TROPONIN, ED: Troponin i, poc: 0 ng/mL (ref 0.00–0.08)

## 2018-01-06 MED ORDER — CEPHALEXIN 500 MG PO CAPS: 500.0000 mg | ORAL_CAPSULE | Freq: Three times a day (TID) | ORAL | 0 refills | Status: DC

## 2018-01-06 MED ORDER — SODIUM CHLORIDE 0.9 % IV SOLN
1.0000 g | Freq: Once | INTRAVENOUS | Status: AC
  Administered 2018-01-06: 1 g via INTRAVENOUS
  Filled 2018-01-06: qty 10

## 2018-01-06 NOTE — ED Notes (Signed)
Paperwork given to [employer]. RN attempted to call report to [place] with no answer. RN notified daughter, [place], of discharge disposition.

## 2018-01-06 NOTE — ED Notes (Signed)
PTAR called for transport, 17th on list

## 2018-01-06 NOTE — ED Provider Notes (Addendum)
[facility] EMERGENCY DEPARTMENT Provider Note   CSN: [id number] Arrival date & time: 01/06/18  1110     History   Chief Complaint Chief Complaint  Patient presents with  . Weakness    HPI Stephanie Donovan is a 84 y.o. female.  Patient is an 84 year old female with history of anemia, thyroid disease, prior TIA, and recent right femur fracture requiring surgery.  She has been in a rehab facility for the past 6 weeks.  This morning, she attempted therapy, however was so weak she could not do it.  She also reports feeling somewhat dizzy when she attempted to sit up.  She denies any fevers chills or chest pain.  The history is provided by the patient.  Weakness  Primary symptoms include no focal weakness. This is a new problem. The current episode started 1 to 2 hours ago. The problem has not changed since onset.There was no focality noted. There has been no fever. Pertinent negatives include no shortness of breath and no chest pain.    Past Medical History:  Diagnosis Date  . Blood transfusion without reported diagnosis   . Chronic back pain   . Chronic constipation   . Dysrhythmia    Afib with RVR - 2015, Oaroxyomal Afib  . GI bleed   . Glaucoma   . Syncope 2015  . Thyroid disease   . TIA (transient ischemic attack) 2015    Patient Active Problem List   Diagnosis Date Noted  . Supracondylar fracture of right femur, closed, initial encounter ([place]) 01/01/2018  . Closed fracture of right distal femur ([place]) 12/24/2017  . Acute blood loss anemia 12/19/2017  . Malnutrition of moderate degree 12/18/2017  . Femur fracture ([place]) 12/17/2017  . Fall 12/17/2017  . HLD (hyperlipidemia) 12/17/2017  . Chronic anemia 12/17/2017  . Closed 4-part fracture of proximal humerus, right, initial encounter 10/09/2017  . Paroxysmal A-fib ([place]) 09/21/2017  . Closed displaced fracture of right femoral neck ([place]) 09/21/2017  . Closed fracture of right proximal humerus  09/21/2017  . Dehydration 09/21/2017  . Hyponatremia 09/21/2017  . Elevated CK 09/21/2017  . Thyroid nodule 09/21/2017  . Closed right hip fracture ([place]) 09/21/2017  . Chronic diastolic CHF (congestive heart failure) ([place]) 09/21/2017  . Sinus tachycardia 09/21/2017  . Acute lower UTI 09/21/2017  . TIA (transient ischemic attack) 08/25/2014  . Aphasia 08/25/2014  . Chronic constipation 05/19/2014  . Fracture of multiple pubic rami ([place]) 04/11/2014  . Pubic bone fracture ([place]) 04/11/2014  . Left hip pain 04/11/2014  . Atrial fibrillation with RVR ([place]) 03/28/2013  . Diarrhea 03/28/2013  . Hypothyroidism 03/24/2013  . Syncope 03/24/2013  . Glaucoma 03/24/2013  . Hypokalemia 03/24/2013  . Acute gastroenteritis 03/24/2013    Past Surgical History:  Procedure Laterality Date  . CATARACT EXTRACTION, BILATERAL    . COLONOSCOPY    . HIP ARTHROPLASTY Right 09/22/2017   Procedure: ARTHROPLASTY BIPOLAR HIP (HEMIARTHROPLASTY);  Surgeon: [name], MD;  Location: [facility];  Service: Orthopedics;  Laterality: Right;  . JOINT REPLACEMENT  11/20/2011   LTKR  . ORIF FEMUR FRACTURE Right 12/20/2017   Procedure: OPEN REDUCTION INTERNAL FIXATION (ORIF) DISTAL FEMUR FRACTURE;  Surgeon: [name], MD;  Location: [facility];  Service: Orthopedics;  Laterality: Right;  . REVERSE SHOULDER ARTHROPLASTY Right 10/09/2017  . REVERSE SHOULDER ARTHROPLASTY Right 10/09/2017   Procedure: REVERSE SHOULDER ARTHROPLASTY;  Surgeon: [name], MD;  Location: [facility];  Service: Orthopedics;  Laterality: Right;  OB History   No obstetric history on file.      Home Medications    Prior to Admission medications   Medication Sig Start Date End Date Taking? Authorizing Provider  aspirin 81 MG chewable tablet Chew 1 tablet (81 mg total) by mouth 2 (two) times daily. 12/24/17   Dhungel, Flonnie Overman, MD  docusate sodium (COLACE) 100 MG capsule Take 1 capsule (100 mg total) by mouth 2 (two) times daily.  12/24/17   Dhungel, Flonnie Overman, MD  HYDROcodone-acetaminophen (NORCO/VICODIN) 5-325 MG tablet Take 1-2 tablets by mouth every 4 (four) hours as needed for moderate pain (pain score 4-6). 12/24/17   Dhungel, Nishant, MD  latanoprost (XALATAN) 0.005 % ophthalmic solution Place 1 drop into both eyes at bedtime.    [provider]  levothyroxine (SYNTHROID, LEVOTHROID) 88 MCG tablet Take 88 mcg by mouth daily. 08/27/14   [provider]  methocarbamol (ROBAXIN) 500 MG tablet Take 1 tablet (500 mg total) by mouth every 8 (eight) hours as needed for muscle spasms. 12/24/17   Dhungel, Flonnie Overman, MD  Multiple Vitamin (MULTIVITAMIN WITH MINERALS) TABS tablet Take 1 tablet by mouth daily. 12/24/17   Dhungel, Flonnie Overman, MD  Nutritional Supplements (FEEDING SUPPLEMENT, BOOST BREEZE,) LIQD Take 1 Can by mouth 3 (three) times daily. 12/24/17   Dhungel, Flonnie Overman, MD  polyethylene glycol (MIRALAX / GLYCOLAX) packet Take 17 g by mouth daily as needed for mild constipation. Patient not taking: Reported on 12/17/2017 09/24/17   Shelly Coss, MD  timolol (TIMOPTIC) 0.5 % ophthalmic solution Place 1 drop into both eyes 2 (two) times daily.    [provider]    Family History Family History  Problem Relation Age of Onset  . Osteoporosis Mother   . Cancer - Colon Father   . Schizophrenia Son     Social History Social History   Tobacco Use  . Smoking status: Never Smoker  . Smokeless tobacco: Never Used  Substance Use Topics  . Alcohol use: No  . Drug use: No     Allergies   Aspirin; Penicillins; and Codeine   Review of Systems Review of Systems  Respiratory: Negative for shortness of breath.   Cardiovascular: Negative for chest pain.  Neurological: Positive for weakness. Negative for focal weakness.  All other systems reviewed and are negative.    Physical Exam Updated Vital Signs BP (!) 106/59   Pulse 91   Temp 98.5 F (36.9 C) (Oral)   Resp 19   Ht 5\' 3"  (1.6 m)   Wt 69  kg   SpO2 95%   BMI 26.95 kg/m   Physical Exam Vitals signs and nursing note reviewed.  Constitutional:      General: She is not in acute distress.    Appearance: She is well-developed. She is not diaphoretic.  HENT:     Head: Normocephalic and atraumatic.  Neck:     Musculoskeletal: Normal range of motion and neck supple.  Cardiovascular:     Rate and Rhythm: Normal rate and regular rhythm.     Heart sounds: No murmur. No friction rub. No gallop.   Pulmonary:     Effort: Pulmonary effort is normal. No respiratory distress.     Breath sounds: Normal breath sounds. No wheezing.  Abdominal:     General: Bowel sounds are normal. There is no distension.     Palpations: Abdomen is soft.     Tenderness: There is no abdominal tenderness.  Musculoskeletal: Normal range of motion.  General: Swelling present. No tenderness.     Comments: There is some swelling of the right lower extremity.  Right lower extremity is in a knee immobilizer.  Skin:    General: Skin is warm and dry.  Neurological:     General: No focal deficit present.     Mental Status: She is alert and oriented to person, place, and time.     Cranial Nerves: No cranial nerve deficit.     Motor: No weakness.     Coordination: Coordination normal.      ED Treatments / Results  Labs (all labs ordered are listed, but only abnormal results are displayed) Labs Reviewed  COMPREHENSIVE METABOLIC PANEL  CBC WITH DIFFERENTIAL/PLATELET  URINALYSIS, ROUTINE W REFLEX MICROSCOPIC  I-STAT TROPONIN, ED    EKG EKG Interpretation  Date/Time:  Monday January 06 2018 11:22:55 EST Ventricular Rate:  95 PR Interval:    QRS Duration: 90 QT Interval:  345 QTC Calculation: 434 R Axis:   54 Text Interpretation:  Sinus rhythm RSR' in V1 or V2, right VCD or RVH Confirmed by Veryl Speak [phone]) on 01/06/2018 11:39:31 AM   Radiology No results found.  Procedures Procedures (including critical care time)  Medications  Ordered in ED Medications - No data to display   Initial Impression / Assessment and Plan / ED Course  I have reviewed the triage vital signs and the nursing notes.  Pertinent labs & imaging results that were available during my care of the patient were reviewed by me and considered in my medical decision making (see chart for details).  Patient presents here with complaints of weakness.  She is currently a resident of a rehabilitation facility where she is recovering from a broken femur.  This morning when therapy came to work with her, she felt so weak that she could not sit up and was sent here for evaluation.  The patient denies any specific complaints.  She states that she is now feeling better.  Her work-up reveals hemoglobin of 8.5 which is improving from prior studies.  She has no white count and electrolytes are essentially unremarkable.  EKG is unchanged and troponin is 0.  She does have the findings of a urinary tract infection and her urinalysis.  She will be given Rocephin and discharged with Keflex.  Final Clinical Impressions(s) / ED Diagnoses   Final diagnoses:  None    ED Discharge Orders    None       Veryl Speak, MD 01/06/18 1448    Veryl Speak, MD 01/06/18 [phone]

## 2018-01-06 NOTE — Discharge Instructions (Addendum)
Keflex as prescribed.  Continue other medications as previously prescribed.  Follow-up with primary doctor if symptoms or not improving in the next few days.

## 2018-01-06 NOTE — ED Notes (Signed)
Pt given food and drink.

## 2018-01-06 NOTE — ED Triage Notes (Signed)
Pt arrives to ED from [facility] with complaints of generalized weakness starting this morning. EMS reports pt was unable to get out of bed. Pt has brace on right leg from a fractured femur. Pt placed in position of comfort with bed locked and lowered, call bell in reach.

## 2018-01-06 NOTE — ED Notes (Signed)
Pt is requesting food.

## 2018-01-16 ENCOUNTER — Telehealth (INDEPENDENT_AMBULATORY_CARE_PROVIDER_SITE_OTHER): Payer: Self-pay | Admitting: Radiology

## 2018-01-16 NOTE — Telephone Encounter (Signed)
Patient d/c knee immobilizer as of yesterday, as per your order she is s/p distal femur fracture below hip prosthesis. She is still not weightbearing, nurse is asking about ROM, can she range, and how much? Phone # [phone] Shelle Iron

## 2018-01-16 NOTE — Telephone Encounter (Signed)
Just gentle flexion and extension of the knee. She does not need anything aggressive.

## 2018-01-17 NOTE — Telephone Encounter (Signed)
I called kim to advise.

## 2018-01-29 ENCOUNTER — Ambulatory Visit (INDEPENDENT_AMBULATORY_CARE_PROVIDER_SITE_OTHER): Payer: Medicare Other | Admitting: Orthopaedic Surgery

## 2018-01-29 ENCOUNTER — Encounter (INDEPENDENT_AMBULATORY_CARE_PROVIDER_SITE_OTHER): Payer: Self-pay | Admitting: Orthopaedic Surgery

## 2018-01-29 ENCOUNTER — Ambulatory Visit (INDEPENDENT_AMBULATORY_CARE_PROVIDER_SITE_OTHER): Payer: Medicare Other

## 2018-01-29 DIAGNOSIS — S72401D Unspecified fracture of lower end of right femur, subsequent encounter for closed fracture with routine healing: Secondary | ICD-10-CM

## 2018-01-29 NOTE — Progress Notes (Signed)
The patient is a 85 year old who is now 40 days status post open reduction internal fixation of the distal right femur fracture.  This was a segmental periprosthetic fracture that was intra-articular as well.  We had to put a bone amount of bone graft and it as well.  She has had a fall once since then.  She is convalescing in a nursing facility.  She is alert and oriented x3.  On exam the incisions healed nicely over her right femur.  There is no evidence of infection.  There is bruising on her right shin that is new.  Her foot is well-perfused.  2 views of the femur obtained on the right side showing intact hardware and bone grafting material.  There is no evidence of hardware failure.  I gave notes the facility to allow her to advance to touchdown to up to 25% weightbearing on the right side.  They can work on range of motion of her right knee.  We will see her back in 4 weeks and I would like 2 views of the right distal femur at that visit.

## 2018-02-05 ENCOUNTER — Telehealth (INDEPENDENT_AMBULATORY_CARE_PROVIDER_SITE_OTHER): Payer: Self-pay

## 2018-02-05 ENCOUNTER — Telehealth (INDEPENDENT_AMBULATORY_CARE_PROVIDER_SITE_OTHER): Payer: Self-pay | Admitting: Orthopaedic Surgery

## 2018-02-05 NOTE — Telephone Encounter (Signed)
See what [facility] will need from [place] to allow the patient to rehab there longer.

## 2018-02-05 NOTE — Telephone Encounter (Signed)
The rest of her message

## 2018-02-05 NOTE — Telephone Encounter (Signed)
Patient's daughter Thersa Salt) called advised [facility] is going to release patient next week. [place] said it is to soon for her mother to be released. [place] said her mother is at only 25% weight bearing and can't get out of bed. [place] said her mother's right knee are also bothering her. [place] asked if Dr. Ninfa Linden can extend her mother's stay  At least for  3 to 4 more weeks. [place] said she thinks her mother need more time to heal and be able to stand on her own. Please call after 12:30pm  The number to contact [place] is [phone]

## 2018-02-05 NOTE — Telephone Encounter (Signed)
Patient's daughter Stephanie Donovan would like a call from Dr. Ninfa Linden concerning patient being released from [facility] next week.  Stated that patient is only 25% WB.  Cb# is [phone].  Please advise.  Thank You.

## 2018-02-06 NOTE — Telephone Encounter (Signed)
Called and LM with [facility] asking if there was anything we could do to help extend stay  Also spoke with patients daughter and she said patient has already been there her covered 100 days so she is asking for a Rx for a wheelchair I faxed this to [facility] for her

## 2018-02-14 ENCOUNTER — Telehealth (INDEPENDENT_AMBULATORY_CARE_PROVIDER_SITE_OTHER): Payer: Self-pay | Admitting: Orthopaedic Surgery

## 2018-02-14 NOTE — Telephone Encounter (Signed)
Stephanie Donovan-(PT) with [facility] called needing verbal orders for HHPT 1 Wk 1, 2 Wk 4 and 1 Wk 2. Stephanie Donovan also said she need clarification on patient's medication. The number to contact Stephanie Donovan is [phone]

## 2018-02-14 NOTE — Telephone Encounter (Signed)
Verbal order given  

## 2018-02-21 ENCOUNTER — Telehealth (INDEPENDENT_AMBULATORY_CARE_PROVIDER_SITE_OTHER): Payer: Self-pay | Admitting: Orthopaedic Surgery

## 2018-02-21 NOTE — Telephone Encounter (Signed)
[name] [facility] [phone]  Sonal Mather     Verbal orders   Twice a wk for 2 wks  Speech Therapy

## 2018-02-24 NOTE — Telephone Encounter (Signed)
Verbal left on VM 

## 2018-02-26 ENCOUNTER — Telehealth (INDEPENDENT_AMBULATORY_CARE_PROVIDER_SITE_OTHER): Payer: Self-pay

## 2018-02-26 ENCOUNTER — Ambulatory Visit (INDEPENDENT_AMBULATORY_CARE_PROVIDER_SITE_OTHER): Payer: Medicare Other

## 2018-02-26 ENCOUNTER — Encounter (INDEPENDENT_AMBULATORY_CARE_PROVIDER_SITE_OTHER): Payer: Self-pay | Admitting: Orthopaedic Surgery

## 2018-02-26 ENCOUNTER — Ambulatory Visit (INDEPENDENT_AMBULATORY_CARE_PROVIDER_SITE_OTHER): Payer: Medicare Other | Admitting: Orthopaedic Surgery

## 2018-02-26 DIAGNOSIS — S72401D Unspecified fracture of lower end of right femur, subsequent encounter for closed fracture with routine healing: Secondary | ICD-10-CM

## 2018-02-26 NOTE — Progress Notes (Signed)
The patient is a 85 year old female who is now 9 weeks status post open reduction-internal fixation of a severely comminuted distal femur fracture.  This was a periprosthetic fracture just distal to a hip replacement.  This was also intra-articular fracture.  We have had her only up to 25% weightbearing.  She still having a concern about a pain and would like to consider knee injection at some point due to severe arthritis in her right knee.  On exam she still has limited mobility of her right thigh and knee with pain.  The pain seems to be more the knee joint itself with stiffness of the knee.  2 views of the right distal femur obtained and reviewed.  There is no evidence of hardware failure and there is actually interval healing of the fracture itself.  At this point we will try to advance her weightbearing as tolerated.  Therapy can work on her balance and coordination but she should only ever be up with assistance and should limit her mobility.  They can work on range of motion of her right knee as well.  We will see her back in 4 weeks for final AP and lateral of the right distal femur.

## 2018-02-26 NOTE — Telephone Encounter (Signed)
Patient's daughter [place] called wanting to know if Dr. Ninfa Linden could consider prescribing a Rx for memory loss and no special diet for the patient?  Patient has appointment today at 1:30pm.  Cb# is [phone].  Please advise.  Thank you

## 2018-02-26 NOTE — Telephone Encounter (Signed)
Patient daughter aware we don't give such medications, she will need to get something like this from her PCP

## 2018-03-05 ENCOUNTER — Telehealth (INDEPENDENT_AMBULATORY_CARE_PROVIDER_SITE_OTHER): Payer: Self-pay | Admitting: Orthopaedic Surgery

## 2018-03-05 NOTE — Telephone Encounter (Signed)
Verbal order left on VM  

## 2018-03-05 NOTE — Telephone Encounter (Signed)
Patient had a fall yesterday during a transfer from the toilet to her wheelchair without assistance from the facility.  Patient does have swelling to her right knee.  Stephanie Donovan is requesting a verbal order for ice for the edema.  CB#[phone].  Thank you.

## 2018-03-20 ENCOUNTER — Telehealth (INDEPENDENT_AMBULATORY_CARE_PROVIDER_SITE_OTHER): Payer: Self-pay | Admitting: Orthopaedic Surgery

## 2018-03-20 NOTE — Telephone Encounter (Signed)
Stephanie Donovan (PT) with [facility] called needing verbal orders to extend HHPT for 1 Wk 2 and  Wk 2 to work on range of motion,balance,gait,strength and endurance. The number to contact Stephanie Donovan is [phone]

## 2018-03-20 NOTE — Telephone Encounter (Signed)
Verbal order given  

## 2018-03-26 ENCOUNTER — Ambulatory Visit (INDEPENDENT_AMBULATORY_CARE_PROVIDER_SITE_OTHER): Payer: Medicare Other | Admitting: Orthopaedic Surgery

## 2018-04-09 ENCOUNTER — Telehealth (INDEPENDENT_AMBULATORY_CARE_PROVIDER_SITE_OTHER): Payer: Self-pay | Admitting: *Deleted

## 2018-04-10 NOTE — Telephone Encounter (Signed)
Verbal order given  

## 2018-05-21 ENCOUNTER — Emergency Department (HOSPITAL_COMMUNITY)
Admission: EM | Admit: 2018-05-21 | Discharge: 2018-05-21 | Disposition: A | Discharge status: Home / Self Care | Payer: Medicare Other | Attending: Emergency Medicine | Admitting: Emergency Medicine

## 2018-05-21 ENCOUNTER — Emergency Department (HOSPITAL_COMMUNITY): Payer: Medicare Other

## 2018-05-21 DIAGNOSIS — R531 Weakness: Secondary | ICD-10-CM | POA: Diagnosis present

## 2018-05-21 DIAGNOSIS — E039 Hypothyroidism, unspecified: Secondary | ICD-10-CM | POA: Insufficient documentation

## 2018-05-21 DIAGNOSIS — Z7982 Long term (current) use of aspirin: Secondary | ICD-10-CM | POA: Diagnosis not present

## 2018-05-21 DIAGNOSIS — Z79899 Other long term (current) drug therapy: Secondary | ICD-10-CM | POA: Diagnosis not present

## 2018-05-21 DIAGNOSIS — I5032 Chronic diastolic (congestive) heart failure: Secondary | ICD-10-CM | POA: Insufficient documentation

## 2018-05-21 DIAGNOSIS — Z96651 Presence of right artificial knee joint: Secondary | ICD-10-CM | POA: Insufficient documentation

## 2018-05-21 DIAGNOSIS — Z8673 Personal history of transient ischemic attack (TIA), and cerebral infarction without residual deficits: Secondary | ICD-10-CM | POA: Diagnosis not present

## 2018-05-21 DIAGNOSIS — N390 Urinary tract infection, site not specified: Secondary | ICD-10-CM | POA: Diagnosis not present

## 2018-05-21 LAB — BASIC METABOLIC PANEL
Anion gap: 9 (ref 5–15)
BUN: 15 mg/dL (ref 8–23)
CO2: 30 mmol/L (ref 22–32)
Calcium: 8.9 mg/dL (ref 8.9–10.3)
Chloride: 93 mmol/L — ABNORMAL LOW (ref 98–111)
Creatinine, Ser: 0.5 mg/dL (ref 0.44–1.00)
GFR calc Af Amer: >60 mL/min (ref >60)
GFR calc non Af Amer: >60 mL/min (ref >60)
Glucose, Bld: 104 mg/dL — ABNORMAL HIGH (ref 70–99)
Potassium: 4 mmol/L (ref 3.5–5.1)
Sodium: 132 mmol/L — ABNORMAL LOW (ref 135–145)

## 2018-05-21 LAB — CBC WITH DIFFERENTIAL/PLATELET
Abs Immature Granulocytes: 0.03 10*3/uL (ref 0.00–0.07)
Basophils Absolute: 0.1 10*3/uL (ref 0.0–0.1)
Basophils Relative: 1 %
Eosinophils Absolute: 0.1 10*3/uL (ref 0.0–0.5)
Eosinophils Relative: 1 %
HCT: 37.9 % (ref 36.0–46.0)
Hemoglobin: 11.9 g/dL — ABNORMAL LOW (ref 12.0–15.0)
Immature Granulocytes: 0 %
Lymphocytes Relative: 14 %
Lymphs Abs: 1.5 10*3/uL (ref 0.7–4.0)
MCH: 27.4 pg (ref 26.0–34.0)
MCHC: 31.4 g/dL (ref 30.0–36.0)
MCV: 87.3 fL (ref 80.0–100.0)
Monocytes Absolute: 1.1 10*3/uL — ABNORMAL HIGH (ref 0.1–1.0)
Monocytes Relative: 11 %
Neutro Abs: 7.4 10*3/uL (ref 1.7–7.7)
Neutrophils Relative %: 73 %
Platelets: 290 10*3/uL (ref 150–400)
RBC: 4.34 MIL/uL (ref 3.87–5.11)
RDW: 15.6 % — ABNORMAL HIGH (ref 11.5–15.5)
WBC: 10.2 10*3/uL (ref 4.0–10.5)
nRBC: 0 % (ref 0.0–0.2)

## 2018-05-21 LAB — URINALYSIS, ROUTINE W REFLEX MICROSCOPIC
Bilirubin Urine: NEGATIVE
Glucose, UA: NEGATIVE mg/dL
Hgb urine dipstick: NEGATIVE
Ketones, ur: NEGATIVE mg/dL
Nitrite: NEGATIVE
Protein, ur: NEGATIVE mg/dL
Specific Gravity, Urine: 1.014 (ref 1.005–1.030)
pH: 6 (ref 5.0–8.0)

## 2018-05-21 MED ORDER — CEPHALEXIN 500 MG PO CAPS: 500.0000 mg | ORAL_CAPSULE | Freq: Two times a day (BID) | ORAL | 0 refills | Status: AC

## 2018-05-21 MED ORDER — SODIUM CHLORIDE 0.9 % IV SOLN
1.0000 g | Freq: Once | INTRAVENOUS | Status: AC
  Administered 2018-05-21: 1 g via INTRAVENOUS
  Filled 2018-05-21: qty 10

## 2018-05-21 MED ORDER — SODIUM CHLORIDE 0.9 % IV BOLUS
1000.0000 mL | Freq: Once | INTRAVENOUS | Status: AC
  Administered 2018-05-21: 1000 mL via INTRAVENOUS

## 2018-05-21 NOTE — ED Triage Notes (Signed)
Transported by [facility] from [place] office-- patient was discharged from [facility] skilled nursing facility on Friday-- had right knee/right hip replacement done back in November. Presents today with generalized weakness. VSS with EMS, CBG: 120.

## 2018-05-21 NOTE — ED Notes (Signed)
Bed: WA21 Expected date:  Expected time:  Means of arrival:  Comments: EMS weakness

## 2018-05-21 NOTE — ED Provider Notes (Signed)
[facility] DEPT Provider Note   CSN: [id number] Arrival date & time: 05/21/18  1347    History   Chief Complaint Chief Complaint  Patient presents with   Weakness    HPI Stephanie Donovan is a 85 y.o. female.     The history is provided by the patient.  Weakness  Severity:  Mild Onset quality:  Gradual Timing:  Constant Progression:  Unchanged Chronicity:  New Context: dehydration   Context: not allergies, not change in medication, not recent infection, not stress and not urinary tract infection   Relieved by:  Nothing Worsened by:  Nothing Associated symptoms: lethargy   Associated symptoms: no abdominal pain, no aphasia, no arthralgias, no chest pain, no cough, no difficulty walking, no dysuria, no numbness in extremities, no fever, no frequency, no headaches, no myalgias, no nausea, no seizures, no sensory-motor deficit, no shortness of breath, no stroke symptoms, no syncope and no vomiting   Risk factors: anemia and neurologic disease     Past Medical History:  Diagnosis Date   Blood transfusion without reported diagnosis    Chronic back pain    Chronic constipation    Dysrhythmia    Afib with RVR - 2015, Oaroxyomal Afib   GI bleed    Glaucoma    Syncope 2015   Thyroid disease    TIA (transient ischemic attack) 2015    Patient Active Problem List   Diagnosis Date Noted   Supracondylar fracture of right femur, closed, initial encounter ([place]) 01/01/2018   Closed fracture of right distal femur ([place]) 12/24/2017   Acute blood loss anemia 12/19/2017   Malnutrition of moderate degree 12/18/2017   Femur fracture ([place]) 12/17/2017   Fall 12/17/2017   HLD (hyperlipidemia) 12/17/2017   Chronic anemia 12/17/2017   Closed 4-part fracture of proximal humerus, right, initial encounter 10/09/2017   Paroxysmal A-fib ([place]) 09/21/2017   Closed displaced fracture of right femoral neck ([place]) 09/21/2017   Closed  fracture of right proximal humerus 09/21/2017   Dehydration 09/21/2017   Hyponatremia 09/21/2017   Elevated CK 09/21/2017   Thyroid nodule 09/21/2017   Closed right hip fracture ([place]) 09/21/2017   Chronic diastolic CHF (congestive heart failure) ([place]) 09/21/2017   Sinus tachycardia 09/21/2017   Acute lower UTI 09/21/2017   TIA (transient ischemic attack) 08/25/2014   Aphasia 08/25/2014   Chronic constipation 05/19/2014   Fracture of multiple pubic rami ([place]) 04/11/2014   Pubic bone fracture ([place]) 04/11/2014   Left hip pain 04/11/2014   Atrial fibrillation with RVR ([place]) 03/28/2013   Diarrhea 03/28/2013   Hypothyroidism 03/24/2013   Syncope 03/24/2013   Glaucoma 03/24/2013   Hypokalemia 03/24/2013   Acute gastroenteritis 03/24/2013    Past Surgical History:  Procedure Laterality Date   CATARACT EXTRACTION, BILATERAL     COLONOSCOPY     HIP ARTHROPLASTY Right 09/22/2017   Procedure: ARTHROPLASTY BIPOLAR HIP (HEMIARTHROPLASTY);  Surgeon: [name], MD;  Location: [facility];  Service: Orthopedics;  Laterality: Right;   JOINT REPLACEMENT  11/20/2011   LTKR   ORIF FEMUR FRACTURE Right 12/20/2017   Procedure: OPEN REDUCTION INTERNAL FIXATION (ORIF) DISTAL FEMUR FRACTURE;  Surgeon: [name], MD;  Location: [facility];  Service: Orthopedics;  Laterality: Right;   REVERSE SHOULDER ARTHROPLASTY Right 10/09/2017   REVERSE SHOULDER ARTHROPLASTY Right 10/09/2017   Procedure: REVERSE SHOULDER ARTHROPLASTY;  Surgeon: [name], MD;  Location: [facility];  Service: Orthopedics;  Laterality: Right;     OB History  No obstetric history on file.      Home Medications    Prior to Admission medications   Medication Sig Start Date End Date Taking? Authorizing Provider  latanoprost (XALATAN) 0.005 % ophthalmic solution Place 1 drop into both eyes at bedtime.   Yes [provider]  levothyroxine (SYNTHROID, LEVOTHROID) 88 MCG tablet Take 88 mcg by  mouth daily. 08/27/14  Yes [provider]  timolol (TIMOPTIC) 0.5 % ophthalmic solution Place 1 drop into both eyes 2 (two) times daily.   Yes [provider]  aspirin 81 MG chewable tablet Chew 1 tablet (81 mg total) by mouth 2 (two) times daily. Patient not taking: Reported on 05/21/2018 12/24/17   Dhungel, Flonnie Overman, MD  cephALEXin (KEFLEX) 500 MG capsule Take 1 capsule (500 mg total) by mouth 2 (two) times daily for 10 days. 05/21/18 05/31/18  Ahren Pettinger, DO  docusate sodium (COLACE) 100 MG capsule Take 1 capsule (100 mg total) by mouth 2 (two) times daily. Patient not taking: Reported on 05/21/2018 12/24/17   Dhungel, Flonnie Overman, MD  HYDROcodone-acetaminophen (NORCO/VICODIN) 5-325 MG tablet Take 1-2 tablets by mouth every 4 (four) hours as needed for moderate pain (pain score 4-6). Patient not taking: Reported on 05/21/2018 12/24/17   Dhungel, Flonnie Overman, MD  methocarbamol (ROBAXIN) 500 MG tablet Take 1 tablet (500 mg total) by mouth every 8 (eight) hours as needed for muscle spasms. Patient not taking: Reported on 05/21/2018 12/24/17   Dhungel, Flonnie Overman, MD  Multiple Vitamin (MULTIVITAMIN WITH MINERALS) TABS tablet Take 1 tablet by mouth daily. Patient not taking: Reported on 05/21/2018 12/24/17   Dhungel, Flonnie Overman, MD  Nutritional Supplements (FEEDING SUPPLEMENT, BOOST BREEZE,) LIQD Take 1 Can by mouth 3 (three) times daily. Patient not taking: Reported on 01/06/2018 12/24/17   Dhungel, Flonnie Overman, MD  polyethylene glycol (MIRALAX / GLYCOLAX) packet Take 17 g by mouth daily as needed for mild constipation. Patient not taking: Reported on 05/21/2018 09/24/17   Shelly Coss, MD    Family History Family History  Problem Relation Age of Onset   Osteoporosis Mother    Cancer - Colon Father    Schizophrenia Son     Social History Social History   Tobacco Use   Smoking status: Never Smoker   Smokeless tobacco: Never Used  Substance Use Topics   Alcohol use: No   Drug use: No      Allergies   Aspirin; Penicillins; and Codeine   Review of Systems Review of Systems  Constitutional: Negative for chills and fever.  HENT: Negative for ear pain and sore throat.   Eyes: Negative for pain and visual disturbance.  Respiratory: Negative for cough and shortness of breath.   Cardiovascular: Negative for chest pain, palpitations and syncope.  Gastrointestinal: Negative for abdominal pain, nausea and vomiting.  Genitourinary: Negative for dysuria, frequency and hematuria.  Musculoskeletal: Negative for arthralgias, back pain and myalgias.  Skin: Negative for color change and rash.  Neurological: Positive for weakness. Negative for seizures, syncope and headaches.  All other systems reviewed and are negative.    Physical Exam Updated Vital Signs  ED Triage Vitals [05/21/18 1354]  Enc Vitals Group     BP 130/90     Pulse Rate (!) 102     Resp 14     Temp      Temp src      SpO2 98 %     Weight      Height      Head Circumference  Peak Flow      Pain Score      Pain Loc      Pain Edu?      Excl. in [place]?     Physical Exam Vitals signs and nursing note reviewed.  Constitutional:      General: She is not in acute distress.    Appearance: She is well-developed. She is not ill-appearing.  HENT:     Head: Normocephalic and atraumatic.     Nose: Nose normal.     Mouth/Throat:     Mouth: Mucous membranes are moist.  Eyes:     Extraocular Movements: Extraocular movements intact.     Conjunctiva/sclera: Conjunctivae normal.     Pupils: Pupils are equal, round, and reactive to light.  Neck:     Musculoskeletal: Normal range of motion and neck supple.  Cardiovascular:     Rate and Rhythm: Normal rate and regular rhythm.     Pulses: Normal pulses.     Heart sounds: Normal heart sounds. No murmur.  Pulmonary:     Effort: Pulmonary effort is normal. No respiratory distress.     Breath sounds: Normal breath sounds.  Abdominal:     General: Abdomen is  flat. There is no distension.     Palpations: Abdomen is soft.     Tenderness: There is no abdominal tenderness.  Musculoskeletal: Normal range of motion.  Skin:    General: Skin is warm and dry.     Capillary Refill: Capillary refill takes less than 2 seconds.  Neurological:     General: No focal deficit present.     Mental Status: She is alert and oriented to person, place, and time.     Cranial Nerves: No cranial nerve deficit.     Sensory: No sensory deficit.     Motor: No weakness.     Coordination: Coordination normal.  Psychiatric:        Mood and Affect: Mood normal.      ED Treatments / Results  Labs (all labs ordered are listed, but only abnormal results are displayed) Labs Reviewed  CBC WITH DIFFERENTIAL/PLATELET - Abnormal; Notable for the following components:      Result Value   Hemoglobin 11.9 (*)    RDW 15.6 (*)    Monocytes Absolute 1.1 (*)    All other components within normal limits  BASIC METABOLIC PANEL - Abnormal; Notable for the following components:   Sodium 132 (*)    Chloride 93 (*)    Glucose, Bld 104 (*)    All other components within normal limits  URINALYSIS, ROUTINE W REFLEX MICROSCOPIC - Abnormal; Notable for the following components:   APPearance HAZY (*)    Leukocytes,Ua TRACE (*)    Bacteria, UA MANY (*)    All other components within normal limits  URINE CULTURE    EKG EKG Interpretation  Date/Time:  Wednesday May 21 2018 14:00:59 EDT Ventricular Rate:  100 PR Interval:    QRS Duration: 85 QT Interval:  344 QTC Calculation: 444 R Axis:   53 Text Interpretation:  Sinus tachycardia RSR' in V1 or V2, probably normal variant Confirmed by Lennice Sites [phone]) on 05/21/2018 2:05:02 PM   Radiology Dg Chest 2 View  Result Date: 05/21/2018 CLINICAL DATA:  Generalized weakness EXAM: CHEST - 2 VIEW COMPARISON:  09/21/2017 FINDINGS: Heart size upper normal. Vascularity normal. Lungs are clear without infiltrate or effusion.  Negative for mass lesion Right shoulder replacement Chronic compression fracture approximately L1. Mild compression fractures  upper thoracic spine indeterminate age and not well evaluated IMPRESSION: No active cardiopulmonary disease. Electronically Signed   By: Franchot Gallo M.D.   On: 05/21/2018 14:31    Procedures Procedures (including critical care time)  Medications Ordered in ED Medications  sodium chloride 0.9 % bolus 1,000 mL (1,000 mLs Intravenous New Bag/Given 05/21/18 1406)  cefTRIAXone (ROCEPHIN) 1 g in sodium chloride 0.9 % 100 mL IVPB (1 g Intravenous New Bag/Given 05/21/18 1522)     Initial Impression / Assessment and Plan / ED Course  I have reviewed the triage vital signs and the nursing notes.  Pertinent labs & imaging results that were available during my care of the patient were reviewed by me and considered in my medical decision making (see chart for details).     Stephanie Donovan is an 85 year old female with history of back pain, atrial fibrillation not on anticoagulation due to history of GI bleed who presents to the ED with generalized weakness.  Patient with no specific symptoms other than she feels more weak than usual today.  She was recently at a care facility and has been home with her daughter for the last several days.  Denies any fever, chills.  Denies any black or bloody stools.  Denies any urinary symptoms.  Patient does not have any chest pain, cough, shortness of breath.  She was able to eat some food this morning but since she was not feeling well she wanted to come for evaluation.  She has no abdominal tenderness on exam.  No signs of volume overload on exam.  Will obtain lab work including chest x-ray.  Patient is neurologically intact.  No concern for stroke.  Will give IV fluid hydration, check for urinary tract infection.  Patient with no significant leukocytosis, anemia, electrolyte abnormality, kidney injury.  Patient with no signs of pneumonia,  pneumothorax, pleural effusion on chest x-ray.  EKG shows sinus rhythm.  No signs of ischemic changes.  Patient denies chest pain.  Doubt ACS.  No concern for stroke.  Urinalysis appears consistent with a urinary tract infection.  Patient does not have any nausea, vomiting.  No signs of sepsis on labs.  Normal vitals.  Mentation is normal.  Overall we will treat with 1 dose of IV Rocephin here in the ED.  Will finish IV fluids.  Will discharge home with Keflex.  Given return precautions.  Recommend follow-up with primary care doctor in 1 week and told to return to the ED symptoms worsen.  This chart was dictated using voice recognition software.  Despite best efforts to proofread,  errors can occur which can change the documentation meaning.    Final Clinical Impressions(s) / ED Diagnoses   Final diagnoses:  Lower urinary tract infectious disease    ED Discharge Orders         Ordered    cephALEXin (KEFLEX) 500 MG capsule  2 times daily     05/21/18 1607           Lennice Sites, DO 05/21/18 1610

## 2018-05-24 LAB — URINE CULTURE: Culture: >=100000 — AB

## 2018-05-25 ENCOUNTER — Telehealth: Payer: Self-pay | Admitting: Emergency Medicine

## 2018-05-25 NOTE — Telephone Encounter (Signed)
Post ED Visit - Positive Culture Follow-up  Culture report reviewed by antimicrobial stewardship pharmacist: [facility] Team []  Elenor Quinones, Pharm.D. []  Heide Guile, Pharm.D., BCPS AQ-ID []  Parks Neptune, Pharm.D., BCPS []  Alycia Rossetti, Pharm.D., BCPS []  [place], [place].D., BCPS, AAHIVP []  Legrand Como, Pharm.D., BCPS, AAHIVP []  Salome Arnt, PharmD, BCPS []  Johnnette Gourd, PharmD, BCPS []  Hughes Better, PharmD, BCPS []  Leeroy Cha, PharmD []  Laqueta Linden, PharmD, BCPS []  Albertina Parr, PharmD  [place] Team []  Leodis Sias, PharmD []  Lindell Spar, PharmD []  Royetta Asal, PharmD []  Graylin Shiver, Rph []  Rema Fendt) Glennon Mac, PharmD []  Arlyn Dunning, PharmD []  Netta Cedars, PharmD [x]  Dia Sitter, PharmD []  Leone Haven, PharmD []  Gretta Arab, PharmD []  Theodis Shove, PharmD []  Peggyann Juba, PharmD []  Reuel Boom, PharmD   Positive urine culture Treated with Cephelaxin, organism sensitive to the same and no further patient follow-up is required at this time.  Larene Beach Drinda Belgard 05/25/2018, 7:24 AM

## 2018-06-18 ENCOUNTER — Encounter (HOSPITAL_COMMUNITY): Payer: Self-pay | Admitting: Emergency Medicine

## 2018-06-18 ENCOUNTER — Emergency Department (HOSPITAL_COMMUNITY): Payer: Medicare Other

## 2018-06-18 ENCOUNTER — Other Ambulatory Visit: Payer: Self-pay

## 2018-06-18 ENCOUNTER — Emergency Department (HOSPITAL_COMMUNITY)
Admission: EM | Admit: 2018-06-18 | Discharge: 2018-06-18 | Disposition: A | Discharge status: Home / Self Care | Payer: Medicare Other | Attending: Emergency Medicine | Admitting: Emergency Medicine

## 2018-06-18 DIAGNOSIS — G8929 Other chronic pain: Secondary | ICD-10-CM | POA: Diagnosis not present

## 2018-06-18 DIAGNOSIS — R531 Weakness: Secondary | ICD-10-CM | POA: Insufficient documentation

## 2018-06-18 DIAGNOSIS — E079 Disorder of thyroid, unspecified: Secondary | ICD-10-CM | POA: Diagnosis not present

## 2018-06-18 DIAGNOSIS — I4891 Unspecified atrial fibrillation: Secondary | ICD-10-CM | POA: Insufficient documentation

## 2018-06-18 DIAGNOSIS — Z8673 Personal history of transient ischemic attack (TIA), and cerebral infarction without residual deficits: Secondary | ICD-10-CM | POA: Insufficient documentation

## 2018-06-18 DIAGNOSIS — Z79899 Other long term (current) drug therapy: Secondary | ICD-10-CM | POA: Diagnosis not present

## 2018-06-18 DIAGNOSIS — Z7982 Long term (current) use of aspirin: Secondary | ICD-10-CM | POA: Diagnosis not present

## 2018-06-18 DIAGNOSIS — M549 Dorsalgia, unspecified: Secondary | ICD-10-CM | POA: Insufficient documentation

## 2018-06-18 LAB — COMPREHENSIVE METABOLIC PANEL
ALT: 13 U/L (ref 0–44)
AST: 18 U/L (ref 15–41)
Albumin: 2.9 g/dL — ABNORMAL LOW (ref 3.5–5.0)
Alkaline Phosphatase: 97 U/L (ref 38–126)
Anion gap: 13 (ref 5–15)
BUN: 13 mg/dL (ref 8–23)
CO2: 25 mmol/L (ref 22–32)
Calcium: 8.8 mg/dL — ABNORMAL LOW (ref 8.9–10.3)
Chloride: 98 mmol/L (ref 98–111)
Creatinine, Ser: 0.64 mg/dL (ref 0.44–1.00)
GFR calc Af Amer: >60 mL/min (ref >60)
GFR calc non Af Amer: >60 mL/min (ref >60)
Glucose, Bld: 87 mg/dL (ref 70–99)
Potassium: 3.6 mmol/L (ref 3.5–5.1)
Sodium: 136 mmol/L (ref 135–145)
Total Bilirubin: 0.5 mg/dL (ref 0.3–1.2)
Total Protein: 6.7 g/dL (ref 6.5–8.1)

## 2018-06-18 LAB — CBC WITH DIFFERENTIAL/PLATELET
Abs Immature Granulocytes: 0.02 10*3/uL (ref 0.00–0.07)
Basophils Absolute: 0.1 10*3/uL (ref 0.0–0.1)
Basophils Relative: 1 %
Eosinophils Absolute: 0.1 10*3/uL (ref 0.0–0.5)
Eosinophils Relative: 1 %
HCT: 37.9 % (ref 36.0–46.0)
Hemoglobin: 11.8 g/dL — ABNORMAL LOW (ref 12.0–15.0)
Immature Granulocytes: 0 %
Lymphocytes Relative: 13 %
Lymphs Abs: 0.9 10*3/uL (ref 0.7–4.0)
MCH: 27.6 pg (ref 26.0–34.0)
MCHC: 31.1 g/dL (ref 30.0–36.0)
MCV: 88.6 fL (ref 80.0–100.0)
Monocytes Absolute: 0.8 10*3/uL (ref 0.1–1.0)
Monocytes Relative: 12 %
Neutro Abs: 5 10*3/uL (ref 1.7–7.7)
Neutrophils Relative %: 73 %
Platelets: 273 10*3/uL (ref 150–400)
RBC: 4.28 MIL/uL (ref 3.87–5.11)
RDW: 14.3 % (ref 11.5–15.5)
WBC: 6.9 10*3/uL (ref 4.0–10.5)
nRBC: 0 % (ref 0.0–0.2)

## 2018-06-18 LAB — URINALYSIS, ROUTINE W REFLEX MICROSCOPIC
Bacteria, UA: NONE SEEN
Bilirubin Urine: NEGATIVE
Glucose, UA: NEGATIVE mg/dL
Hgb urine dipstick: NEGATIVE
Ketones, ur: NEGATIVE mg/dL
Leukocytes,Ua: NEGATIVE
Nitrite: NEGATIVE
Protein, ur: NEGATIVE mg/dL
Specific Gravity, Urine: 1.01 (ref 1.005–1.030)
pH: 6 (ref 5.0–8.0)

## 2018-06-18 LAB — TROPONIN I: Troponin I: <0.03 ng/mL (ref <0.03)

## 2018-06-18 MED ORDER — SODIUM CHLORIDE 0.9 % IV BOLUS
500.0000 mL | Freq: Once | INTRAVENOUS | Status: AC
  Administered 2018-06-18: 12:00:00 500 mL via INTRAVENOUS

## 2018-06-18 NOTE — ED Notes (Signed)
Pts daughters name is [place] and you can reach her at [phone].

## 2018-06-18 NOTE — ED Triage Notes (Signed)
Pt arrives to ED from home with complaints of generalized weakness starting this morning after she ate breakfast. Pt states she just feels weak and that she does not know why her daughter called EMS.

## 2018-06-18 NOTE — Discharge Instructions (Addendum)
Continue medications as previously prescribed.  Return to the emergency department if you develop chest pain, difficulty breathing, high fever, or other new and concerning symptoms.

## 2018-06-18 NOTE — ED Notes (Signed)
Patient verbalizes understanding of discharge instructions. Opportunity for questioning and answers were provided. Armband removed by staff, pt discharged from ED. Pt wheeled to the waiting area to meet daughter who is the pts ride.

## 2018-06-18 NOTE — ED Provider Notes (Signed)
[facility] EMERGENCY DEPARTMENT Provider Note   CSN: [id number] Arrival date & time: 06/18/18  1057    History   Chief Complaint Chief Complaint  Patient presents with  . Weakness    HPI Stephanie Donovan is a 85 y.o. female.     Patient is an 85 year old female with past medical history of atrial fibrillation with RVR, GI bleed, chronic back pain.  She presents today for evaluation of weakness.  This morning she told her daughter she was not feeling well and EMS was called and the patient transported here.  Patient denies to me that she is experiencing any specific symptoms other than weakness.  She denies any chest pain, shortness of breath, abdominal pain, diarrhea, vomiting  The history is provided by the patient.  Weakness  Severity:  Moderate Duration:  1 hour Timing:  Constant Progression:  Unchanged Chronicity:  New Relieved by:  Nothing Worsened by:  Nothing Ineffective treatments:  None tried Associated symptoms: no abdominal pain and no shortness of breath     Past Medical History:  Diagnosis Date  . Blood transfusion without reported diagnosis   . Chronic back pain   . Chronic constipation   . Dysrhythmia    Afib with RVR - 2015, Oaroxyomal Afib  . GI bleed   . Glaucoma   . Syncope 2015  . Thyroid disease   . TIA (transient ischemic attack) 2015    Patient Active Problem List   Diagnosis Date Noted  . Supracondylar fracture of right femur, closed, initial encounter ([place]) 01/01/2018  . Closed fracture of right distal femur ([place]) 12/24/2017  . Acute blood loss anemia 12/19/2017  . Malnutrition of moderate degree 12/18/2017  . Femur fracture ([place]) 12/17/2017  . Fall 12/17/2017  . HLD (hyperlipidemia) 12/17/2017  . Chronic anemia 12/17/2017  . Closed 4-part fracture of proximal humerus, right, initial encounter 10/09/2017  . Paroxysmal A-fib ([place]) 09/21/2017  . Closed displaced fracture of right femoral neck ([place]) 09/21/2017   . Closed fracture of right proximal humerus 09/21/2017  . Dehydration 09/21/2017  . Hyponatremia 09/21/2017  . Elevated CK 09/21/2017  . Thyroid nodule 09/21/2017  . Closed right hip fracture ([place]) 09/21/2017  . Chronic diastolic CHF (congestive heart failure) ([place]) 09/21/2017  . Sinus tachycardia 09/21/2017  . Acute lower UTI 09/21/2017  . TIA (transient ischemic attack) 08/25/2014  . Aphasia 08/25/2014  . Chronic constipation 05/19/2014  . Fracture of multiple pubic rami ([place]) 04/11/2014  . Pubic bone fracture ([place]) 04/11/2014  . Left hip pain 04/11/2014  . Atrial fibrillation with RVR ([place]) 03/28/2013  . Diarrhea 03/28/2013  . Hypothyroidism 03/24/2013  . Syncope 03/24/2013  . Glaucoma 03/24/2013  . Hypokalemia 03/24/2013  . Acute gastroenteritis 03/24/2013    Past Surgical History:  Procedure Laterality Date  . CATARACT EXTRACTION, BILATERAL    . COLONOSCOPY    . HIP ARTHROPLASTY Right 09/22/2017   Procedure: ARTHROPLASTY BIPOLAR HIP (HEMIARTHROPLASTY);  Surgeon: [name], MD;  Location: [facility];  Service: Orthopedics;  Laterality: Right;  . JOINT REPLACEMENT  11/20/2011   LTKR  . ORIF FEMUR FRACTURE Right 12/20/2017   Procedure: OPEN REDUCTION INTERNAL FIXATION (ORIF) DISTAL FEMUR FRACTURE;  Surgeon: [name], MD;  Location: [facility];  Service: Orthopedics;  Laterality: Right;  . REVERSE SHOULDER ARTHROPLASTY Right 10/09/2017  . REVERSE SHOULDER ARTHROPLASTY Right 10/09/2017   Procedure: REVERSE SHOULDER ARTHROPLASTY;  Surgeon: [name], MD;  Location: [facility];  Service: Orthopedics;  Laterality: Right;  OB History   No obstetric history on file.      Home Medications    Prior to Admission medications   Medication Sig Start Date End Date Taking? Authorizing Provider  aspirin 81 MG chewable tablet Chew 1 tablet (81 mg total) by mouth 2 (two) times daily. Patient not taking: Reported on 05/21/2018 12/24/17   Dhungel, Flonnie Overman, MD  docusate sodium  (COLACE) 100 MG capsule Take 1 capsule (100 mg total) by mouth 2 (two) times daily. Patient not taking: Reported on 05/21/2018 12/24/17   Dhungel, Flonnie Overman, MD  HYDROcodone-acetaminophen (NORCO/VICODIN) 5-325 MG tablet Take 1-2 tablets by mouth every 4 (four) hours as needed for moderate pain (pain score 4-6). Patient not taking: Reported on 05/21/2018 12/24/17   Dhungel, Flonnie Overman, MD  latanoprost (XALATAN) 0.005 % ophthalmic solution Place 1 drop into both eyes at bedtime.    [provider]  levothyroxine (SYNTHROID, LEVOTHROID) 88 MCG tablet Take 88 mcg by mouth daily. 08/27/14   [provider]  methocarbamol (ROBAXIN) 500 MG tablet Take 1 tablet (500 mg total) by mouth every 8 (eight) hours as needed for muscle spasms. Patient not taking: Reported on 05/21/2018 12/24/17   Dhungel, Flonnie Overman, MD  Multiple Vitamin (MULTIVITAMIN WITH MINERALS) TABS tablet Take 1 tablet by mouth daily. Patient not taking: Reported on 05/21/2018 12/24/17   Dhungel, Flonnie Overman, MD  Nutritional Supplements (FEEDING SUPPLEMENT, BOOST BREEZE,) LIQD Take 1 Can by mouth 3 (three) times daily. Patient not taking: Reported on 01/06/2018 12/24/17   Dhungel, Flonnie Overman, MD  polyethylene glycol (MIRALAX / GLYCOLAX) packet Take 17 g by mouth daily as needed for mild constipation. Patient not taking: Reported on 05/21/2018 09/24/17   Shelly Coss, MD  timolol (TIMOPTIC) 0.5 % ophthalmic solution Place 1 drop into both eyes 2 (two) times daily.    [provider]    Family History Family History  Problem Relation Age of Onset  . Osteoporosis Mother   . Cancer - Colon Father   . Schizophrenia Son     Social History Social History   Tobacco Use  . Smoking status: Never Smoker  . Smokeless tobacco: Never Used  Substance Use Topics  . Alcohol use: No  . Drug use: No     Allergies   Aspirin; Penicillins; and Codeine   Review of Systems Review of Systems  Respiratory: Negative for shortness of breath.    Gastrointestinal: Negative for abdominal pain.  Neurological: Positive for weakness.  All other systems reviewed and are negative.    Physical Exam Updated Vital Signs BP 128/76 (BP Location: Right Arm)   Pulse (!) 107   Temp 98 F (36.7 C) (Oral)   Resp (!) 21   SpO2 97%   Physical Exam Vitals signs and nursing note reviewed.  Constitutional:      General: She is not in acute distress.    Appearance: She is well-developed. She is not diaphoretic.  HENT:     Head: Normocephalic and atraumatic.  Neck:     Musculoskeletal: Normal range of motion and neck supple.  Cardiovascular:     Rate and Rhythm: Normal rate and regular rhythm.     Heart sounds: No murmur. No friction rub. No gallop.   Pulmonary:     Effort: Pulmonary effort is normal. No respiratory distress.     Breath sounds: Normal breath sounds. No wheezing.  Abdominal:     General: Bowel sounds are normal. There is no distension.     Palpations: Abdomen is soft.  Tenderness: There is no abdominal tenderness.  Musculoskeletal: Normal range of motion.  Skin:    General: Skin is warm and dry.  Neurological:     Mental Status: She is alert and oriented to person, place, and time.      ED Treatments / Results  Labs (all labs ordered are listed, but only abnormal results are displayed) Labs Reviewed  CBC WITH DIFFERENTIAL/PLATELET  TROPONIN I  COMPREHENSIVE METABOLIC PANEL    EKG None  Radiology No results found.  Procedures Procedures (including critical care time)  Medications Ordered in ED Medications  sodium chloride 0.9 % bolus 500 mL (has no administration in time range)     Initial Impression / Assessment and Plan / ED Course  I have reviewed the triage vital signs and the nursing notes.  Pertinent labs & imaging results that were available during my care of the patient were reviewed by me and considered in my medical decision making (see chart for details).  Patient presenting here  with complaints of "not feeling well".  This began this morning.  911 was called and the patient was transported here.  She denies to me that she is experiencing any specific discomfort or other symptoms.  Her work-up shows no significant abnormalities.  She has no fever, no elevation of white count, clear urine, unremarkable electrolytes, and chest x-ray which is negative.  EKG is unchanged and troponin is negative.  At this point, I see no indication for admission.  The patient has no complaints at present and will be discharged.  She is to return as needed for any problems.  Final Clinical Impressions(s) / ED Diagnoses   Final diagnoses:  None    ED Discharge Orders    None       Veryl Speak, MD 06/18/18 1357

## 2018-06-18 NOTE — ED Notes (Signed)
Nurse navigator spoke with patient and then contacted daughter who spoke with patient on phone. Plan of care updated and waiting for results.

## 2018-06-18 NOTE — ED Notes (Signed)
Pts daughter was contacted by the nurse navigator Marya Amsler, RN and was updated on the pts discharge plans. Daughter is in route to pick up pt.

## 2018-07-23 ENCOUNTER — Other Ambulatory Visit: Payer: Self-pay

## 2018-07-23 ENCOUNTER — Emergency Department (HOSPITAL_COMMUNITY)
Admission: EM | Admit: 2018-07-23 | Discharge: 2018-07-23 | Disposition: A | Discharge status: Home / Self Care | Payer: Medicare Other | Attending: Emergency Medicine | Admitting: Emergency Medicine

## 2018-07-23 ENCOUNTER — Encounter (HOSPITAL_COMMUNITY): Payer: Self-pay

## 2018-07-23 DIAGNOSIS — R531 Weakness: Secondary | ICD-10-CM | POA: Insufficient documentation

## 2018-07-23 DIAGNOSIS — I5032 Chronic diastolic (congestive) heart failure: Secondary | ICD-10-CM | POA: Insufficient documentation

## 2018-07-23 DIAGNOSIS — Z96652 Presence of left artificial knee joint: Secondary | ICD-10-CM | POA: Insufficient documentation

## 2018-07-23 DIAGNOSIS — E039 Hypothyroidism, unspecified: Secondary | ICD-10-CM | POA: Insufficient documentation

## 2018-07-23 DIAGNOSIS — Z96611 Presence of right artificial shoulder joint: Secondary | ICD-10-CM | POA: Insufficient documentation

## 2018-07-23 DIAGNOSIS — Z79899 Other long term (current) drug therapy: Secondary | ICD-10-CM | POA: Diagnosis not present

## 2018-07-23 DIAGNOSIS — R419 Unspecified symptoms and signs involving cognitive functions and awareness: Secondary | ICD-10-CM | POA: Insufficient documentation

## 2018-07-23 DIAGNOSIS — Z96641 Presence of right artificial hip joint: Secondary | ICD-10-CM | POA: Insufficient documentation

## 2018-07-23 LAB — TSH: TSH: 1.052 u[IU]/mL (ref 0.350–4.500)

## 2018-07-23 LAB — URINALYSIS, ROUTINE W REFLEX MICROSCOPIC
Bacteria, UA: NONE SEEN
Bilirubin Urine: NEGATIVE
Glucose, UA: NEGATIVE mg/dL
Hgb urine dipstick: NEGATIVE
Ketones, ur: NEGATIVE mg/dL
Leukocytes,Ua: NEGATIVE
Nitrite: NEGATIVE
Protein, ur: NEGATIVE mg/dL
Specific Gravity, Urine: 1.011 (ref 1.005–1.030)
pH: 6 (ref 5.0–8.0)

## 2018-07-23 LAB — CBC WITH DIFFERENTIAL/PLATELET
Abs Immature Granulocytes: 0.04 10*3/uL (ref 0.00–0.07)
Basophils Absolute: 0.1 10*3/uL (ref 0.0–0.1)
Basophils Relative: 1 %
Eosinophils Absolute: 0.2 10*3/uL (ref 0.0–0.5)
Eosinophils Relative: 2 %
HCT: 35.4 % — ABNORMAL LOW (ref 36.0–46.0)
Hemoglobin: 11.1 g/dL — ABNORMAL LOW (ref 12.0–15.0)
Immature Granulocytes: 1 %
Lymphocytes Relative: 15 %
Lymphs Abs: 1.2 10*3/uL (ref 0.7–4.0)
MCH: 27.2 pg (ref 26.0–34.0)
MCHC: 31.4 g/dL (ref 30.0–36.0)
MCV: 86.8 fL (ref 80.0–100.0)
Monocytes Absolute: 0.8 10*3/uL (ref 0.1–1.0)
Monocytes Relative: 10 %
Neutro Abs: 5.8 10*3/uL (ref 1.7–7.7)
Neutrophils Relative %: 71 %
Platelets: 273 10*3/uL (ref 150–400)
RBC: 4.08 MIL/uL (ref 3.87–5.11)
RDW: 13.8 % (ref 11.5–15.5)
WBC: 8 10*3/uL (ref 4.0–10.5)
nRBC: 0 % (ref 0.0–0.2)

## 2018-07-23 LAB — COMPREHENSIVE METABOLIC PANEL
ALT: 15 U/L (ref 0–44)
AST: 19 U/L (ref 15–41)
Albumin: 2.9 g/dL — ABNORMAL LOW (ref 3.5–5.0)
Alkaline Phosphatase: 96 U/L (ref 38–126)
Anion gap: 9 (ref 5–15)
BUN: 20 mg/dL (ref 8–23)
CO2: 27 mmol/L (ref 22–32)
Calcium: 8.5 mg/dL — ABNORMAL LOW (ref 8.9–10.3)
Chloride: 97 mmol/L — ABNORMAL LOW (ref 98–111)
Creatinine, Ser: 0.48 mg/dL (ref 0.44–1.00)
GFR calc Af Amer: >60 mL/min (ref >60)
GFR calc non Af Amer: >60 mL/min (ref >60)
Glucose, Bld: 97 mg/dL (ref 70–99)
Potassium: 4.2 mmol/L (ref 3.5–5.1)
Sodium: 133 mmol/L — ABNORMAL LOW (ref 135–145)
Total Bilirubin: 0.3 mg/dL (ref 0.3–1.2)
Total Protein: 6.4 g/dL — ABNORMAL LOW (ref 6.5–8.1)

## 2018-07-23 MED ORDER — SODIUM CHLORIDE 0.9 % IV BOLUS
500.0000 mL | Freq: Once | INTRAVENOUS | Status: AC
  Administered 2018-07-23: 500 mL via INTRAVENOUS

## 2018-07-23 NOTE — Discharge Instructions (Addendum)
Please call your primary doctor and discuss Stephanie Donovan' generalized weakness and memory problems.  She may have suggestions for home health, starting donepezil, or other ideas to help improve her quality of life.

## 2018-07-23 NOTE — ED Triage Notes (Signed)
Pt c/o of sudden onset full body weakness/heaviness starting 1.5 hours ago, while the patient was eating lunch.  Pt vital signs are WNL.   BP 143/62 Hr: 89 RR: 16 O2: 95 RA CBG: 131

## 2018-07-23 NOTE — ED Notes (Signed)
Bed: WA05 Expected date:  Expected time:  Means of arrival:  Comments: EMS-weakness 

## 2018-07-23 NOTE — ED Provider Notes (Signed)
[facility] DEPT Provider Note   CSN: [id number] Arrival date & time: 07/23/18  1411    History   Chief Complaint No chief complaint on file.   HPI Stephanie Donovan is a 85 y.o. female with a PMH of atrial fibrillation with RVR, thyroid disease, TIA, GI bleed who presents with a chief complaint of weakness.  She presented on 06/18/2018 with a similar complaint, and work-up was noncontributory.  She says that she has felt like her activity has reduced significantly over the last few months, but she felt acute generalized weakness this morning.  She denies fevers, chills, dysuria, GI complaints, chest pain, and shortness of breath.  Last TSH in 2019 was normal.  She lives with her daughter who helps around the house.   Spoke with her daughter, [place], on the phone, who says that her mother has had a decline in memory over the last few months.  She struggles to remember to drink enough water throughout the day and has lost the ability to perform any activities during the day.  [place] is her primary caretaker and lives with her.  Past Medical History:  Diagnosis Date  . Blood transfusion without reported diagnosis   . Chronic back pain   . Chronic constipation   . Dysrhythmia    Afib with RVR - 2015, Oaroxyomal Afib  . GI bleed   . Glaucoma   . Syncope 2015  . Thyroid disease   . TIA (transient ischemic attack) 2015    Patient Active Problem List   Diagnosis Date Noted  . Supracondylar fracture of right femur, closed, initial encounter ([place]) 01/01/2018  . Closed fracture of right distal femur ([place]) 12/24/2017  . Acute blood loss anemia 12/19/2017  . Malnutrition of moderate degree 12/18/2017  . Femur fracture ([place]) 12/17/2017  . Fall 12/17/2017  . HLD (hyperlipidemia) 12/17/2017  . Chronic anemia 12/17/2017  . Closed 4-part fracture of proximal humerus, right, initial encounter 10/09/2017  . Paroxysmal A-fib ([place]) 09/21/2017  . Closed  displaced fracture of right femoral neck ([place]) 09/21/2017  . Closed fracture of right proximal humerus 09/21/2017  . Dehydration 09/21/2017  . Hyponatremia 09/21/2017  . Elevated CK 09/21/2017  . Thyroid nodule 09/21/2017  . Closed right hip fracture ([place]) 09/21/2017  . Chronic diastolic CHF (congestive heart failure) ([place]) 09/21/2017  . Sinus tachycardia 09/21/2017  . Acute lower UTI 09/21/2017  . TIA (transient ischemic attack) 08/25/2014  . Aphasia 08/25/2014  . Chronic constipation 05/19/2014  . Fracture of multiple pubic rami ([place]) 04/11/2014  . Pubic bone fracture ([place]) 04/11/2014  . Left hip pain 04/11/2014  . Atrial fibrillation with RVR ([place]) 03/28/2013  . Diarrhea 03/28/2013  . Hypothyroidism 03/24/2013  . Syncope 03/24/2013  . Glaucoma 03/24/2013  . Hypokalemia 03/24/2013  . Acute gastroenteritis 03/24/2013    Past Surgical History:  Procedure Laterality Date  . CATARACT EXTRACTION, BILATERAL    . COLONOSCOPY    . HIP ARTHROPLASTY Right 09/22/2017   Procedure: ARTHROPLASTY BIPOLAR HIP (HEMIARTHROPLASTY);  Surgeon: [name], MD;  Location: [facility];  Service: Orthopedics;  Laterality: Right;  . JOINT REPLACEMENT  11/20/2011   LTKR  . ORIF FEMUR FRACTURE Right 12/20/2017   Procedure: OPEN REDUCTION INTERNAL FIXATION (ORIF) DISTAL FEMUR FRACTURE;  Surgeon: [name], MD;  Location: [facility];  Service: Orthopedics;  Laterality: Right;  . REVERSE SHOULDER ARTHROPLASTY Right 10/09/2017  . REVERSE SHOULDER ARTHROPLASTY Right 10/09/2017   Procedure: REVERSE SHOULDER ARTHROPLASTY;  Surgeon: Griffin Basil,  Laretta Alstrom, MD;  Location: [facility];  Service: Orthopedics;  Laterality: Right;     OB History   No obstetric history on file.      Home Medications    Prior to Admission medications   Medication Sig Start Date End Date Taking? Authorizing Provider  metoprolol succinate (TOPROL-XL) 25 MG 24 hr tablet Take 25 mg by mouth daily.   Yes [provider]  aspirin  81 MG chewable tablet Chew 1 tablet (81 mg total) by mouth 2 (two) times daily. Patient not taking: Reported on 07/23/2018 12/24/17   Dhungel, Flonnie Overman, MD  docusate sodium (COLACE) 100 MG capsule Take 1 capsule (100 mg total) by mouth 2 (two) times daily. Patient not taking: Reported on 05/21/2018 12/24/17   Dhungel, Flonnie Overman, MD  HYDROcodone-acetaminophen (NORCO/VICODIN) 5-325 MG tablet Take 1-2 tablets by mouth every 4 (four) hours as needed for moderate pain (pain score 4-6). Patient not taking: Reported on 05/21/2018 12/24/17   Dhungel, Flonnie Overman, MD  methocarbamol (ROBAXIN) 500 MG tablet Take 1 tablet (500 mg total) by mouth every 8 (eight) hours as needed for muscle spasms. Patient not taking: Reported on 05/21/2018 12/24/17   Dhungel, Flonnie Overman, MD  Multiple Vitamin (MULTIVITAMIN WITH MINERALS) TABS tablet Take 1 tablet by mouth daily. Patient not taking: Reported on 05/21/2018 12/24/17   Dhungel, Flonnie Overman, MD  Nutritional Supplements (FEEDING SUPPLEMENT, BOOST BREEZE,) LIQD Take 1 Can by mouth 3 (three) times daily. Patient not taking: Reported on 01/06/2018 12/24/17   Dhungel, Flonnie Overman, MD  polyethylene glycol (MIRALAX / GLYCOLAX) packet Take 17 g by mouth daily as needed for mild constipation. Patient not taking: Reported on 05/21/2018 09/24/17   Shelly Coss, MD    Family History Family History  Problem Relation Age of Onset  . Osteoporosis Mother   . Cancer - Colon Father   . Schizophrenia Son     Social History Social History   Tobacco Use  . Smoking status: Never Smoker  . Smokeless tobacco: Never Used  Substance Use Topics  . Alcohol use: No  . Drug use: No     Allergies   Aspirin, Penicillins, and Codeine   Review of Systems Review of Systems  Constitutional: Positive for activity change and fatigue. Negative for fever.  HENT: Negative for congestion.   Respiratory: Negative for cough and shortness of breath.   Cardiovascular: Negative for chest pain.  Gastrointestinal:  Negative for abdominal pain.  Genitourinary: Negative for dysuria.  Neurological: Positive for weakness (generalized).  Psychiatric/Behavioral: Positive for confusion.     Physical Exam Updated Vital Signs BP 135/65   Pulse (!) 59   Temp 98.2 F (36.8 C) (Oral)   Resp 18   Ht 5\' 3"  (1.6 m)   Wt 68 kg   SpO2 99%   BMI 26.57 kg/m   Physical Exam Constitutional:      General: She is not in acute distress.    Appearance: She is not ill-appearing.  HENT:     Head: Normocephalic and atraumatic.     Nose: Nose normal. No congestion or rhinorrhea.     Mouth/Throat:     Mouth: Mucous membranes are moist.  Eyes:     Extraocular Movements: Extraocular movements intact.     Pupils: Pupils are equal, round, and reactive to light.  Neck:     Musculoskeletal: Normal range of motion.  Cardiovascular:     Rate and Rhythm: Normal rate and regular rhythm.  Pulmonary:     Effort: Pulmonary effort is normal.  No respiratory distress.     Breath sounds: Normal breath sounds.  Abdominal:     General: Abdomen is flat. There is no distension.     Palpations: Abdomen is soft.  Musculoskeletal: Normal range of motion.        General: No swelling.     Right lower leg: No edema.     Left lower leg: No edema.  Skin:    General: Skin is warm and dry.  Neurological:     General: No focal deficit present.     Mental Status: She is alert.     Comments: Evidence of neurocognitive decline      ED Treatments / Results  Labs (all labs ordered are listed, but only abnormal results are displayed) Labs Reviewed  COMPREHENSIVE METABOLIC PANEL - Abnormal; Notable for the following components:      Result Value   Sodium 133 (*)    Chloride 97 (*)    Calcium 8.5 (*)    Total Protein 6.4 (*)    Albumin 2.9 (*)    All other components within normal limits  CBC WITH DIFFERENTIAL/PLATELET - Abnormal; Notable for the following components:   Hemoglobin 11.1 (*)    HCT 35.4 (*)    All other  components within normal limits  URINE CULTURE  TSH  URINALYSIS, ROUTINE W REFLEX MICROSCOPIC    EKG EKG Interpretation  Date/Time:  Wednesday July 23 2018 15:59:47 EDT Ventricular Rate:  77 PR Interval:  154 QRS Duration: 78 QT Interval:  386 QTC Calculation: 436 R Axis:   47 Text Interpretation:  Normal sinus rhythm Normal ECG No STEMI  Confirmed by Nanda Quinton [phone]) on 07/23/2018 4:15:02 PM   Radiology No results found.  Procedures Procedures (including critical care time)  Medications Ordered in ED Medications  sodium chloride 0.9 % bolus 500 mL (0 mLs Intravenous Stopped 07/23/18 1648)     Initial Impression / Assessment and Plan / ED Course  I have reviewed the triage vital signs and the nursing notes.  Pertinent labs & imaging results that were available during my care of the patient were reviewed by me and considered in my medical decision making (see chart for details).       Assessment:  Work-up for generalized weakness, including EKG, CMP, CBC, TSH, and UA, unremarkable.  This is similar to the findings from her ED visit in May 2020.  Concern for neurocognitive decline causing decrease in p.o. intake and dehydration leading to the symptoms, although the differential for generalized weakness is broad.    Plan: Gave 1 L normal saline bolus in the emergency department.  Advised patient's daughter to call her PCP and discuss this issue with her.  The patient may benefit from medications to slow the progression of neurocognitive decline and possibly home health to help her daughter take care of her.  Her daughter was in agreement with this plan. Final Clinical Impressions(s) / ED Diagnoses   Final diagnoses:  Generalized weakness    ED Discharge Orders    None       Kathrene Alu, MD 07/23/18 1755    Margette Fast, MD 07/24/18 1135

## 2018-07-23 NOTE — ED Notes (Signed)
Pt urinated and her pants were wet.  This RN offered to get her paper scrubs pants.  Pt refused.

## 2018-07-24 LAB — URINE CULTURE: Special Requests: NORMAL

## 2018-10-19 ENCOUNTER — Other Ambulatory Visit: Payer: Self-pay

## 2018-10-19 ENCOUNTER — Emergency Department (HOSPITAL_COMMUNITY)
Admission: EM | Admit: 2018-10-19 | Discharge: 2018-10-19 | Disposition: A | Discharge status: Home / Self Care | Payer: Medicare Other | Attending: Emergency Medicine | Admitting: Emergency Medicine

## 2018-10-19 DIAGNOSIS — I5032 Chronic diastolic (congestive) heart failure: Secondary | ICD-10-CM | POA: Diagnosis not present

## 2018-10-19 DIAGNOSIS — Z7982 Long term (current) use of aspirin: Secondary | ICD-10-CM | POA: Diagnosis not present

## 2018-10-19 DIAGNOSIS — I480 Paroxysmal atrial fibrillation: Secondary | ICD-10-CM | POA: Diagnosis not present

## 2018-10-19 DIAGNOSIS — R531 Weakness: Secondary | ICD-10-CM | POA: Insufficient documentation

## 2018-10-19 DIAGNOSIS — Z8673 Personal history of transient ischemic attack (TIA), and cerebral infarction without residual deficits: Secondary | ICD-10-CM | POA: Insufficient documentation

## 2018-10-19 DIAGNOSIS — Z79899 Other long term (current) drug therapy: Secondary | ICD-10-CM | POA: Diagnosis not present

## 2018-10-19 DIAGNOSIS — E039 Hypothyroidism, unspecified: Secondary | ICD-10-CM | POA: Insufficient documentation

## 2018-10-19 LAB — CBC WITH DIFFERENTIAL/PLATELET
Abs Immature Granulocytes: 0.03 10*3/uL (ref 0.00–0.07)
Basophils Absolute: 0.1 10*3/uL (ref 0.0–0.1)
Basophils Relative: 1 %
Eosinophils Absolute: 0.1 10*3/uL (ref 0.0–0.5)
Eosinophils Relative: 2 %
HCT: 40.9 % (ref 36.0–46.0)
Hemoglobin: 13.2 g/dL (ref 12.0–15.0)
Immature Granulocytes: 0 %
Lymphocytes Relative: 19 %
Lymphs Abs: 1.7 10*3/uL (ref 0.7–4.0)
MCH: 28 pg (ref 26.0–34.0)
MCHC: 32.3 g/dL (ref 30.0–36.0)
MCV: 86.8 fL (ref 80.0–100.0)
Monocytes Absolute: 0.9 10*3/uL (ref 0.1–1.0)
Monocytes Relative: 10 %
Neutro Abs: 5.8 10*3/uL (ref 1.7–7.7)
Neutrophils Relative %: 68 %
Platelets: 296 10*3/uL (ref 150–400)
RBC: 4.71 MIL/uL (ref 3.87–5.11)
RDW: 14.5 % (ref 11.5–15.5)
WBC: 8.7 10*3/uL (ref 4.0–10.5)
nRBC: 0 % (ref 0.0–0.2)

## 2018-10-19 LAB — BASIC METABOLIC PANEL
Anion gap: 12 (ref 5–15)
BUN: 14 mg/dL (ref 8–23)
CO2: 26 mmol/L (ref 22–32)
Calcium: 9.2 mg/dL (ref 8.9–10.3)
Chloride: 97 mmol/L — ABNORMAL LOW (ref 98–111)
Creatinine, Ser: 0.64 mg/dL (ref 0.44–1.00)
GFR calc Af Amer: >60 mL/min (ref >60)
GFR calc non Af Amer: >60 mL/min (ref >60)
Glucose, Bld: 96 mg/dL (ref 70–99)
Potassium: 3.8 mmol/L (ref 3.5–5.1)
Sodium: 135 mmol/L (ref 135–145)

## 2018-10-19 LAB — URINALYSIS, ROUTINE W REFLEX MICROSCOPIC
Bilirubin Urine: NEGATIVE
Glucose, UA: NEGATIVE mg/dL
Hgb urine dipstick: NEGATIVE
Ketones, ur: NEGATIVE mg/dL
Leukocytes,Ua: NEGATIVE
Nitrite: NEGATIVE
Protein, ur: NEGATIVE mg/dL
Specific Gravity, Urine: 1.01 (ref 1.005–1.030)
pH: 6 (ref 5.0–8.0)

## 2018-10-19 NOTE — ED Triage Notes (Signed)
[facility] reports patient complaining of weakness for 3 days. Family thinks it may be an UTI but patient denies any urinary symptoms.  Last vitals 98.8 CBG--115  RR-20 O2-96% RA HR-95

## 2018-10-19 NOTE — ED Provider Notes (Signed)
[facility] EMERGENCY DEPARTMENT Provider Note   CSN: [id number] Arrival date & time: 10/19/18  1340     History   Chief Complaint Chief Complaint  Patient presents with  . Weakness    HPI Stephanie Donovan is a 85 y.o. female.     HPI   85 year old female presenting for evaluation at request of family.  Reportedly generalized weakness for the past 2 to 3 days.  Patient admits that she has been laying around the couch for most of the day which is not typical of her.  She has no acute complaints otherwise.  Denies any acute pain.  No dyspnea.  No fevers.  No vomiting or diarrhea.  Appetite is fair.  No recent medication changes.  No urinary complaints.  Past Medical History:  Diagnosis Date  . Blood transfusion without reported diagnosis   . Chronic back pain   . Chronic constipation   . Dysrhythmia    Afib with RVR - 2015, Oaroxyomal Afib  . GI bleed   . Glaucoma   . Syncope 2015  . Thyroid disease   . TIA (transient ischemic attack) 2015    Patient Active Problem List   Diagnosis Date Noted  . Supracondylar fracture of right femur, closed, initial encounter ([place]) 01/01/2018  . Closed fracture of right distal femur ([place]) 12/24/2017  . Acute blood loss anemia 12/19/2017  . Malnutrition of moderate degree 12/18/2017  . Femur fracture ([place]) 12/17/2017  . Fall 12/17/2017  . HLD (hyperlipidemia) 12/17/2017  . Chronic anemia 12/17/2017  . Closed 4-part fracture of proximal humerus, right, initial encounter 10/09/2017  . Paroxysmal A-fib ([place]) 09/21/2017  . Closed displaced fracture of right femoral neck ([place]) 09/21/2017  . Closed fracture of right proximal humerus 09/21/2017  . Dehydration 09/21/2017  . Hyponatremia 09/21/2017  . Elevated CK 09/21/2017  . Thyroid nodule 09/21/2017  . Closed right hip fracture ([place]) 09/21/2017  . Chronic diastolic CHF (congestive heart failure) ([place]) 09/21/2017  . Sinus tachycardia 09/21/2017  . Acute lower UTI  09/21/2017  . TIA (transient ischemic attack) 08/25/2014  . Aphasia 08/25/2014  . Chronic constipation 05/19/2014  . Fracture of multiple pubic rami ([place]) 04/11/2014  . Pubic bone fracture ([place]) 04/11/2014  . Left hip pain 04/11/2014  . Atrial fibrillation with RVR ([place]) 03/28/2013  . Diarrhea 03/28/2013  . Hypothyroidism 03/24/2013  . Syncope 03/24/2013  . Glaucoma 03/24/2013  . Hypokalemia 03/24/2013  . Acute gastroenteritis 03/24/2013    Past Surgical History:  Procedure Laterality Date  . CATARACT EXTRACTION, BILATERAL    . COLONOSCOPY    . HIP ARTHROPLASTY Right 09/22/2017   Procedure: ARTHROPLASTY BIPOLAR HIP (HEMIARTHROPLASTY);  Surgeon: [name], MD;  Location: [facility];  Service: Orthopedics;  Laterality: Right;  . JOINT REPLACEMENT  11/20/2011   LTKR  . ORIF FEMUR FRACTURE Right 12/20/2017   Procedure: OPEN REDUCTION INTERNAL FIXATION (ORIF) DISTAL FEMUR FRACTURE;  Surgeon: [name], MD;  Location: [facility];  Service: Orthopedics;  Laterality: Right;  . REVERSE SHOULDER ARTHROPLASTY Right 10/09/2017  . REVERSE SHOULDER ARTHROPLASTY Right 10/09/2017   Procedure: REVERSE SHOULDER ARTHROPLASTY;  Surgeon: [name], MD;  Location: [facility];  Service: Orthopedics;  Laterality: Right;     OB History   No obstetric history on file.      Home Medications    Prior to Admission medications   Medication Sig Start Date End Date Taking? Authorizing Provider  aspirin 81 MG chewable tablet Chew 1 tablet (  81 mg total) by mouth 2 (two) times daily. Patient not taking: Reported on 07/23/2018 12/24/17   Dhungel, Flonnie Overman, MD  docusate sodium (COLACE) 100 MG capsule Take 1 capsule (100 mg total) by mouth 2 (two) times daily. Patient not taking: Reported on 05/21/2018 12/24/17   Dhungel, Flonnie Overman, MD  HYDROcodone-acetaminophen (NORCO/VICODIN) 5-325 MG tablet Take 1-2 tablets by mouth every 4 (four) hours as needed for moderate pain (pain score 4-6). Patient not taking:  Reported on 05/21/2018 12/24/17   Dhungel, Flonnie Overman, MD  methocarbamol (ROBAXIN) 500 MG tablet Take 1 tablet (500 mg total) by mouth every 8 (eight) hours as needed for muscle spasms. Patient not taking: Reported on 05/21/2018 12/24/17   Dhungel, Flonnie Overman, MD  metoprolol succinate (TOPROL-XL) 25 MG 24 hr tablet Take 25 mg by mouth daily.    [provider]  Multiple Vitamin (MULTIVITAMIN WITH MINERALS) TABS tablet Take 1 tablet by mouth daily. Patient not taking: Reported on 05/21/2018 12/24/17   Dhungel, Flonnie Overman, MD  Nutritional Supplements (FEEDING SUPPLEMENT, BOOST BREEZE,) LIQD Take 1 Can by mouth 3 (three) times daily. Patient not taking: Reported on 01/06/2018 12/24/17   Dhungel, Flonnie Overman, MD  polyethylene glycol (MIRALAX / GLYCOLAX) packet Take 17 g by mouth daily as needed for mild constipation. Patient not taking: Reported on 05/21/2018 09/24/17   Shelly Coss, MD    Family History Family History  Problem Relation Age of Onset  . Osteoporosis Mother   . Cancer - Colon Father   . Schizophrenia Son     Social History Social History   Tobacco Use  . Smoking status: Never Smoker  . Smokeless tobacco: Never Used  Substance Use Topics  . Alcohol use: No  . Drug use: No     Allergies   Aspirin, Penicillins, and Codeine   Review of Systems Review of Systems  All systems reviewed and negative, other than as noted in HPI.  Physical Exam Updated Vital Signs BP (!) 154/93   Pulse 89   Temp 97.9 F (36.6 C) (Oral)   Resp 20   Ht 5\' 4"  (1.626 m)   Wt 59 kg   SpO2 96%   BMI 22.31 kg/m   Physical Exam Vitals signs and nursing note reviewed.  Constitutional:      General: She is not in acute distress.    Appearance: She is well-developed.  HENT:     Head: Normocephalic and atraumatic.  Eyes:     General:        Right eye: No discharge.        Left eye: No discharge.     Conjunctiva/sclera: Conjunctivae normal.  Neck:     Musculoskeletal: Neck supple.   Cardiovascular:     Rate and Rhythm: Normal rate and regular rhythm.     Heart sounds: Normal heart sounds. No murmur. No friction rub. No gallop.   Pulmonary:     Effort: Pulmonary effort is normal. No respiratory distress.     Breath sounds: Normal breath sounds.  Abdominal:     General: There is no distension.     Palpations: Abdomen is soft.     Tenderness: There is no abdominal tenderness.  Musculoskeletal:        General: No tenderness.  Skin:    General: Skin is warm and dry.  Neurological:     General: No focal deficit present.     Mental Status: She is alert and oriented to person, place, and time.     Cranial Nerves: No  cranial nerve deficit.     Sensory: No sensory deficit.     Motor: No weakness.  Psychiatric:        Behavior: Behavior normal.        Thought Content: Thought content normal.     ED Treatments / Results  Labs (all labs ordered are listed, but only abnormal results are displayed) Labs Reviewed  BASIC METABOLIC PANEL - Abnormal; Notable for the following components:      Result Value   Chloride 97 (*)    All other components within normal limits  URINALYSIS, ROUTINE W REFLEX MICROSCOPIC - Abnormal; Notable for the following components:   APPearance HAZY (*)    All other components within normal limits  CBC WITH DIFFERENTIAL/PLATELET    EKG EKG Interpretation  Date/Time:  Sunday October 19 2018 13:50:05 EDT Ventricular Rate:  91 PR Interval:    QRS Duration: 87 QT Interval:  362 QTC Calculation: 446 R Axis:   37 Text Interpretation:  Sinus rhythm RSR' in V1 or V2, right VCD or RVH Confirmed by Virgel Manifold [phone]) on 10/19/2018 2:58:25 PM   Radiology No results found.   No results found.  Procedures Procedures (including critical care time)  Medications Ordered in ED Medications - No data to display   Initial Impression / Assessment and Plan / ED Course  I have reviewed the triage vital signs and the nursing notes.   Pertinent labs & imaging results that were available during my care of the patient were reviewed by me and considered in my medical decision making (see chart for details).   85 year old female with generalized weakness.  Patient herself has little complaints and she came in at request of family for evaluation.  She is afebrile.  Generally well-appearing.  Hemodynamically stable.  Exam is nonfocal.  UA not consistent with UTI.  Hx of anemia previously requiring transfusion, but CBC normal today. No focal findings to suggest CVA. Doesn't appear to be an infectious process.   Final Clinical Impressions(s) / ED Diagnoses   Final diagnoses:  Generalized weakness    ED Discharge Orders    None       Virgel Manifold, MD 10/22/18 [phone]

## 2018-10-19 NOTE — ED Notes (Signed)
Patient was wheeled out at 1730 and went home with her daughter.

## 2018-10-19 NOTE — Discharge Instructions (Signed)
You labs today look good. You are not anemic. Your metabolic panel looks good as well. You do not have a UTI. I do not think you had a stroke. I want you to try and get up and do at least do light activity. If you are not starting to feel better by the middle of this week then I want you to be re-checked.

## 2018-11-08 ENCOUNTER — Encounter (HOSPITAL_COMMUNITY): Payer: Self-pay | Admitting: Emergency Medicine

## 2018-11-08 ENCOUNTER — Emergency Department (HOSPITAL_COMMUNITY)
Admission: EM | Admit: 2018-11-08 | Discharge: 2018-11-08 | Disposition: A | Discharge status: Home / Self Care | Payer: Medicare Other | Attending: Emergency Medicine | Admitting: Emergency Medicine

## 2018-11-08 ENCOUNTER — Other Ambulatory Visit: Payer: Self-pay

## 2018-11-08 DIAGNOSIS — I5032 Chronic diastolic (congestive) heart failure: Secondary | ICD-10-CM | POA: Insufficient documentation

## 2018-11-08 DIAGNOSIS — R55 Syncope and collapse: Secondary | ICD-10-CM | POA: Insufficient documentation

## 2018-11-08 DIAGNOSIS — Z96641 Presence of right artificial hip joint: Secondary | ICD-10-CM | POA: Insufficient documentation

## 2018-11-08 DIAGNOSIS — R531 Weakness: Secondary | ICD-10-CM | POA: Insufficient documentation

## 2018-11-08 DIAGNOSIS — Z79899 Other long term (current) drug therapy: Secondary | ICD-10-CM | POA: Insufficient documentation

## 2018-11-08 DIAGNOSIS — E039 Hypothyroidism, unspecified: Secondary | ICD-10-CM | POA: Diagnosis not present

## 2018-11-08 DIAGNOSIS — Z96611 Presence of right artificial shoulder joint: Secondary | ICD-10-CM | POA: Diagnosis not present

## 2018-11-08 LAB — URINALYSIS, ROUTINE W REFLEX MICROSCOPIC
Bacteria, UA: NONE SEEN
Bilirubin Urine: NEGATIVE
Glucose, UA: NEGATIVE mg/dL
Ketones, ur: NEGATIVE mg/dL
Leukocytes,Ua: NEGATIVE
Nitrite: NEGATIVE
Protein, ur: NEGATIVE mg/dL
Specific Gravity, Urine: 1.012 (ref 1.005–1.030)
pH: 7 (ref 5.0–8.0)

## 2018-11-08 LAB — CBC WITH DIFFERENTIAL/PLATELET
Abs Immature Granulocytes: 0.02 10*3/uL (ref 0.00–0.07)
Basophils Absolute: 0.1 10*3/uL (ref 0.0–0.1)
Basophils Relative: 2 %
Eosinophils Absolute: 0.1 10*3/uL (ref 0.0–0.5)
Eosinophils Relative: 1 %
HCT: 36.9 % (ref 36.0–46.0)
Hemoglobin: 11.7 g/dL — ABNORMAL LOW (ref 12.0–15.0)
Immature Granulocytes: 0 %
Lymphocytes Relative: 19 %
Lymphs Abs: 1.4 10*3/uL (ref 0.7–4.0)
MCH: 27.6 pg (ref 26.0–34.0)
MCHC: 31.7 g/dL (ref 30.0–36.0)
MCV: 87 fL (ref 80.0–100.0)
Monocytes Absolute: 0.9 10*3/uL (ref 0.1–1.0)
Monocytes Relative: 11 %
Neutro Abs: 5.2 10*3/uL (ref 1.7–7.7)
Neutrophils Relative %: 67 %
Platelets: 296 10*3/uL (ref 150–400)
RBC: 4.24 MIL/uL (ref 3.87–5.11)
RDW: 14.3 % (ref 11.5–15.5)
WBC: 7.7 10*3/uL (ref 4.0–10.5)
nRBC: 0 % (ref 0.0–0.2)

## 2018-11-08 LAB — BASIC METABOLIC PANEL
Anion gap: 7 (ref 5–15)
BUN: 16 mg/dL (ref 8–23)
CO2: 28 mmol/L (ref 22–32)
Calcium: 8.7 mg/dL — ABNORMAL LOW (ref 8.9–10.3)
Chloride: 99 mmol/L (ref 98–111)
Creatinine, Ser: 0.55 mg/dL (ref 0.44–1.00)
GFR calc Af Amer: >60 mL/min (ref >60)
GFR calc non Af Amer: >60 mL/min (ref >60)
Glucose, Bld: 89 mg/dL (ref 70–99)
Potassium: 4.3 mmol/L (ref 3.5–5.1)
Sodium: 134 mmol/L — ABNORMAL LOW (ref 135–145)

## 2018-11-08 LAB — CBG MONITORING, ED: Glucose-Capillary: 85 mg/dL (ref 70–99)

## 2018-11-08 MED ORDER — SODIUM CHLORIDE 0.9 % IV BOLUS
1000.0000 mL | Freq: Once | INTRAVENOUS | Status: AC
  Administered 2018-11-08: 1000 mL via INTRAVENOUS

## 2018-11-08 NOTE — ED Notes (Signed)
Family reports they are on the way to get patient.

## 2018-11-08 NOTE — ED Notes (Signed)
ED Provider at bedside. 

## 2018-11-08 NOTE — ED Provider Notes (Signed)
[facility] DEPT Provider Note   CSN: [id number] Arrival date & time: 11/08/18  1357     History   Chief Complaint Chief Complaint  Patient presents with  . Weakness    HPI Stephanie Donovan is a 85 y.o. female.     The history is provided by the patient and medical records. No language interpreter was used.  Weakness    85 year old female with history of chronic back pain, atrial fibrillation, thyroid disease, prior EMS from home for evaluation of generalized weakness.  Patient report this morning she just felt weak.  Especially if she gets up she feels as if she was going to pass out.  She denies any associated pain with that.  Denies having any fever, chills, headache, neck pain, productive cough, chest pain, shortness of breath, abdominal pain, back pain or pain to her extremities.  She denies any dysuria.  She denies any medication changes.  She has been eating and drinking fine.  She felt fine yesterday.  She lives at home with her daughter.  She denies any confusion.    Past Medical History:  Diagnosis Date  . Blood transfusion without reported diagnosis   . Chronic back pain   . Chronic constipation   . Dysrhythmia    Afib with RVR - 2015, Oaroxyomal Afib  . GI bleed   . Glaucoma   . Syncope 2015  . Thyroid disease   . TIA (transient ischemic attack) 2015    Patient Active Problem List   Diagnosis Date Noted  . Supracondylar fracture of right femur, closed, initial encounter ([place]) 01/01/2018  . Closed fracture of right distal femur ([place]) 12/24/2017  . Acute blood loss anemia 12/19/2017  . Malnutrition of moderate degree 12/18/2017  . Femur fracture ([place]) 12/17/2017  . Fall 12/17/2017  . HLD (hyperlipidemia) 12/17/2017  . Chronic anemia 12/17/2017  . Closed 4-part fracture of proximal humerus, right, initial encounter 10/09/2017  . Paroxysmal A-fib ([place]) 09/21/2017  . Closed displaced fracture of right femoral neck ([place])  09/21/2017  . Closed fracture of right proximal humerus 09/21/2017  . Dehydration 09/21/2017  . Hyponatremia 09/21/2017  . Elevated CK 09/21/2017  . Thyroid nodule 09/21/2017  . Closed right hip fracture ([place]) 09/21/2017  . Chronic diastolic CHF (congestive heart failure) ([place]) 09/21/2017  . Sinus tachycardia 09/21/2017  . Acute lower UTI 09/21/2017  . TIA (transient ischemic attack) 08/25/2014  . Aphasia 08/25/2014  . Chronic constipation 05/19/2014  . Fracture of multiple pubic rami ([place]) 04/11/2014  . Pubic bone fracture ([place]) 04/11/2014  . Left hip pain 04/11/2014  . Atrial fibrillation with RVR ([place]) 03/28/2013  . Diarrhea 03/28/2013  . Hypothyroidism 03/24/2013  . Syncope 03/24/2013  . Glaucoma 03/24/2013  . Hypokalemia 03/24/2013  . Acute gastroenteritis 03/24/2013    Past Surgical History:  Procedure Laterality Date  . CATARACT EXTRACTION, BILATERAL    . COLONOSCOPY    . HIP ARTHROPLASTY Right 09/22/2017   Procedure: ARTHROPLASTY BIPOLAR HIP (HEMIARTHROPLASTY);  Surgeon: [name], MD;  Location: [facility];  Service: Orthopedics;  Laterality: Right;  . JOINT REPLACEMENT  11/20/2011   LTKR  . ORIF FEMUR FRACTURE Right 12/20/2017   Procedure: OPEN REDUCTION INTERNAL FIXATION (ORIF) DISTAL FEMUR FRACTURE;  Surgeon: [name], MD;  Location: [facility];  Service: Orthopedics;  Laterality: Right;  . REVERSE SHOULDER ARTHROPLASTY Right 10/09/2017  . REVERSE SHOULDER ARTHROPLASTY Right 10/09/2017   Procedure: REVERSE SHOULDER ARTHROPLASTY;  Surgeon: [name], MD;  Location: [facility];  Service: Orthopedics;  Laterality: Right;     OB History   No obstetric history on file.      Home Medications    Prior to Admission medications   Medication Sig Start Date End Date Taking? Authorizing Provider  aspirin 81 MG chewable tablet Chew 1 tablet (81 mg total) by mouth 2 (two) times daily. Patient not taking: Reported on 07/23/2018 12/24/17   Dhungel, Flonnie Overman, MD   docusate sodium (COLACE) 100 MG capsule Take 1 capsule (100 mg total) by mouth 2 (two) times daily. Patient not taking: Reported on 05/21/2018 12/24/17   Dhungel, Flonnie Overman, MD  HYDROcodone-acetaminophen (NORCO/VICODIN) 5-325 MG tablet Take 1-2 tablets by mouth every 4 (four) hours as needed for moderate pain (pain score 4-6). Patient not taking: Reported on 05/21/2018 12/24/17   Dhungel, Flonnie Overman, MD  methocarbamol (ROBAXIN) 500 MG tablet Take 1 tablet (500 mg total) by mouth every 8 (eight) hours as needed for muscle spasms. Patient not taking: Reported on 05/21/2018 12/24/17   Dhungel, Flonnie Overman, MD  metoprolol succinate (TOPROL-XL) 25 MG 24 hr tablet Take 25 mg by mouth daily.    [provider]  Multiple Vitamin (MULTIVITAMIN WITH MINERALS) TABS tablet Take 1 tablet by mouth daily. Patient not taking: Reported on 05/21/2018 12/24/17   Dhungel, Flonnie Overman, MD  Nutritional Supplements (FEEDING SUPPLEMENT, BOOST BREEZE,) LIQD Take 1 Can by mouth 3 (three) times daily. Patient not taking: Reported on 01/06/2018 12/24/17   Dhungel, Flonnie Overman, MD  polyethylene glycol (MIRALAX / GLYCOLAX) packet Take 17 g by mouth daily as needed for mild constipation. Patient not taking: Reported on 05/21/2018 09/24/17   Shelly Coss, MD    Family History Family History  Problem Relation Age of Onset  . Osteoporosis Mother   . Cancer - Colon Father   . Schizophrenia Son     Social History Social History   Tobacco Use  . Smoking status: Never Smoker  . Smokeless tobacco: Never Used  Substance Use Topics  . Alcohol use: No  . Drug use: No     Allergies   Aspirin, Penicillins, and Codeine   Review of Systems Review of Systems  Neurological: Positive for weakness.  All other systems reviewed and are negative.    Physical Exam Updated Vital Signs BP (!) 147/73   Pulse 86   Temp 97.7 F (36.5 C) (Oral)   Resp 19   SpO2 98%   Physical Exam Vitals signs and nursing note reviewed.   Constitutional:      General: She is not in acute distress.    Appearance: She is well-developed.  HENT:     Head: Atraumatic.     Mouth/Throat:     Mouth: Mucous membranes are moist.  Eyes:     Conjunctiva/sclera: Conjunctivae normal.  Neck:     Musculoskeletal: Neck supple. No neck rigidity.  Cardiovascular:     Rate and Rhythm: Normal rate and regular rhythm.     Pulses: Normal pulses.     Heart sounds: Normal heart sounds.  Pulmonary:     Effort: Pulmonary effort is normal.     Breath sounds: Normal breath sounds.  Abdominal:     Palpations: Abdomen is soft.     Tenderness: There is no abdominal tenderness.  Musculoskeletal:     Comments: Equal upper strength bilaterally with normal grip strength.  Difficulty raising right lower extremity due to recently injured right knee.  Normal strength to left lower extremities.  Intact distal pulses throughout.  Skin:    Findings: No rash.  Neurological:     Mental Status: She is alert.     GCS: GCS eye subscore is 4. GCS verbal subscore is 5. GCS motor subscore is 6.     Cranial Nerves: Cranial nerves are intact.     Comments: Patient is alert to place, time, situation, and current event.  She mistakenly thought it was November instead of October.  Psychiatric:        Mood and Affect: Mood normal.      ED Treatments / Results  Labs (all labs ordered are listed, but only abnormal results are displayed) Labs Reviewed  BASIC METABOLIC PANEL - Abnormal; Notable for the following components:      Result Value   Sodium 134 (*)    Calcium 8.7 (*)    All other components within normal limits  CBC WITH DIFFERENTIAL/PLATELET - Abnormal; Notable for the following components:   Hemoglobin 11.7 (*)    All other components within normal limits  URINALYSIS, ROUTINE W REFLEX MICROSCOPIC - Abnormal; Notable for the following components:   Hgb urine dipstick SMALL (*)    All other components within normal limits  CBG MONITORING, ED     EKG EKG Interpretation  Date/Time:  Saturday November 08 2018 14:12:00 EDT Ventricular Rate:  81 PR Interval:    QRS Duration: 86 QT Interval:  370 QTC Calculation: 430 R Axis:   57 Text Interpretation:  Sinus rhythm Atrial premature complexes Abnormal R-wave progression, early transition Confirmed by Madalyn Rob [phone]) on 11/08/2018 2:58:57 PM   Radiology No results found.  Procedures Procedures (including critical care time)  Medications Ordered in ED Medications  sodium chloride 0.9 % bolus 1,000 mL (1,000 mLs Intravenous New Bag/Given (Non-Interop) 11/08/18 1739)     Initial Impression / Assessment and Plan / ED Course  I have reviewed the triage vital signs and the nursing notes.  Pertinent labs & imaging results that were available during my care of the patient were reviewed by me and considered in my medical decision making (see chart for details).        BP (!) 147/73   Pulse 86   Temp 97.7 F (36.5 C) (Oral)   Resp 19   SpO2 98%    Final Clinical Impressions(s) / ED Diagnoses   Final diagnoses:  Generalized weakness  Near syncope    ED Discharge Orders    None     2:23 PM Patient here with complaints of generalized weakness that started earlier today.  History of UTI in the past.  She does not have any other symptoms.  She has equal strength throughout with exception of her right leg secondary to recent right knee injury.  She appears to be in no acute discomfort.  3:21 PM I have discussed patient's care with her daughter via the phone.  Plan to perform medical screening exam, check electrolytes, check her H&H, assess for any signs of urinary tract infection and will determine disposition.  Patient currently resting comfortably.  5:35 PM Mild orthostatic vital sign which may suggest some signs of dehydration causing her near syncope.  EKG without concerning changes no arrhythmia.  Patient given IV fluid, daughter is updated.  Anticipate  discharge home after the the conclusion of IV hydration.   Domenic Moras, PA-C 11/08/18 1757    Lucrezia Starch, MD 11/09/18 [phone]

## 2018-11-08 NOTE — ED Notes (Signed)
Pt unable to perform standing orthostatic's. Pt reported weakness after standing and stated she needed to sit back down. Pt back in bed and resting at this time.

## 2018-11-08 NOTE — ED Triage Notes (Signed)
Per EMS, patient from home, daughter reports generalized weakness since this morning. Denies fevers and recent illness. Hx UTI. Ambulatory with walker. A&Ox4.

## 2018-11-08 NOTE — Discharge Instructions (Addendum)
Please drink adequate amount of fluid daily to help with your near syncope and weakness.  Call and follow up closely with your doctor for further care.  Return if you have any concerns.

## 2019-02-02 ENCOUNTER — Emergency Department (HOSPITAL_COMMUNITY): Payer: Medicare Other

## 2019-02-02 ENCOUNTER — Other Ambulatory Visit: Payer: Self-pay

## 2019-02-02 ENCOUNTER — Emergency Department (HOSPITAL_COMMUNITY)
Admission: EM | Admit: 2019-02-02 | Discharge: 2019-02-02 | Disposition: A | Discharge status: Home / Self Care | Payer: Medicare Other | Attending: Emergency Medicine | Admitting: Emergency Medicine

## 2019-02-02 DIAGNOSIS — I480 Paroxysmal atrial fibrillation: Secondary | ICD-10-CM | POA: Insufficient documentation

## 2019-02-02 DIAGNOSIS — Z7982 Long term (current) use of aspirin: Secondary | ICD-10-CM | POA: Diagnosis not present

## 2019-02-02 DIAGNOSIS — I5032 Chronic diastolic (congestive) heart failure: Secondary | ICD-10-CM | POA: Diagnosis not present

## 2019-02-02 DIAGNOSIS — E039 Hypothyroidism, unspecified: Secondary | ICD-10-CM | POA: Diagnosis not present

## 2019-02-02 DIAGNOSIS — M6281 Muscle weakness (generalized): Secondary | ICD-10-CM | POA: Diagnosis not present

## 2019-02-02 DIAGNOSIS — R110 Nausea: Secondary | ICD-10-CM | POA: Insufficient documentation

## 2019-02-02 DIAGNOSIS — Z96652 Presence of left artificial knee joint: Secondary | ICD-10-CM | POA: Diagnosis not present

## 2019-02-02 DIAGNOSIS — R531 Weakness: Secondary | ICD-10-CM | POA: Diagnosis present

## 2019-02-02 DIAGNOSIS — Z8673 Personal history of transient ischemic attack (TIA), and cerebral infarction without residual deficits: Secondary | ICD-10-CM | POA: Diagnosis not present

## 2019-02-02 DIAGNOSIS — Z79899 Other long term (current) drug therapy: Secondary | ICD-10-CM | POA: Diagnosis not present

## 2019-02-02 LAB — COMPREHENSIVE METABOLIC PANEL
ALT: 14 U/L (ref 0–44)
AST: 13 U/L — ABNORMAL LOW (ref 15–41)
Albumin: 3.1 g/dL — ABNORMAL LOW (ref 3.5–5.0)
Alkaline Phosphatase: 120 U/L (ref 38–126)
Anion gap: 8 (ref 5–15)
BUN: 9 mg/dL (ref 8–23)
CO2: 30 mmol/L (ref 22–32)
Calcium: 8.8 mg/dL — ABNORMAL LOW (ref 8.9–10.3)
Chloride: 98 mmol/L (ref 98–111)
Creatinine, Ser: 0.58 mg/dL (ref 0.44–1.00)
GFR calc Af Amer: >60 mL/min (ref >60)
GFR calc non Af Amer: >60 mL/min (ref >60)
Glucose, Bld: 109 mg/dL — ABNORMAL HIGH (ref 70–99)
Potassium: 3.8 mmol/L (ref 3.5–5.1)
Sodium: 136 mmol/L (ref 135–145)
Total Bilirubin: 0.4 mg/dL (ref 0.3–1.2)
Total Protein: 6.9 g/dL (ref 6.5–8.1)

## 2019-02-02 LAB — URINALYSIS, ROUTINE W REFLEX MICROSCOPIC
Bilirubin Urine: NEGATIVE
Glucose, UA: NEGATIVE mg/dL
Ketones, ur: NEGATIVE mg/dL
Leukocytes,Ua: NEGATIVE
Nitrite: NEGATIVE
Protein, ur: NEGATIVE mg/dL
Specific Gravity, Urine: 1.008 (ref 1.005–1.030)
pH: 8 (ref 5.0–8.0)

## 2019-02-02 LAB — CBC WITH DIFFERENTIAL/PLATELET
Abs Immature Granulocytes: 0.02 10*3/uL (ref 0.00–0.07)
Basophils Absolute: 0.1 10*3/uL (ref 0.0–0.1)
Basophils Relative: 1 %
Eosinophils Absolute: 0.1 10*3/uL (ref 0.0–0.5)
Eosinophils Relative: 1 %
HCT: 38.8 % (ref 36.0–46.0)
Hemoglobin: 12.3 g/dL (ref 12.0–15.0)
Immature Granulocytes: 0 %
Lymphocytes Relative: 16 %
Lymphs Abs: 1.1 10*3/uL (ref 0.7–4.0)
MCH: 27.8 pg (ref 26.0–34.0)
MCHC: 31.7 g/dL (ref 30.0–36.0)
MCV: 87.6 fL (ref 80.0–100.0)
Monocytes Absolute: 0.7 10*3/uL (ref 0.1–1.0)
Monocytes Relative: 10 %
Neutro Abs: 5.2 10*3/uL (ref 1.7–7.7)
Neutrophils Relative %: 72 %
Platelets: 263 10*3/uL (ref 150–400)
RBC: 4.43 MIL/uL (ref 3.87–5.11)
RDW: 14.1 % (ref 11.5–15.5)
WBC: 7.2 10*3/uL (ref 4.0–10.5)
nRBC: 0 % (ref 0.0–0.2)

## 2019-02-02 NOTE — ED Notes (Signed)
PTAR at bedside 

## 2019-02-02 NOTE — Discharge Instructions (Signed)
The cause of Stephanie Donovan' symptoms was not identified today.  Please have her follow up with her family doctor for recheck within the next week.

## 2019-02-02 NOTE — ED Provider Notes (Signed)
[facility] DEPT Provider Note   CSN: [id number] Arrival date & time: 02/02/19  0749     History Chief Complaint  Patient presents with  . Nausea  . Weakness    Stephanie Donovan is a 86 y.o. female.  The history is provided by the patient, medical records and the nursing home. No language interpreter was used.  Weakness  Stephanie Donovan is a 86 y.o. female who presents to the Emergency Department complaining of weakness. Level V caveat due to confusion. History is provided by patient, daughter, EMS. Patient presents from [facility] facility for evaluation of generalized weakness and nausea for the last hour. Patient denies any complaints on ED arrival and is unsure why she is in the emergency department. Attempted to contact [facility] for additional information but there was no answer. Contacted patient's daughter for additional information. Daughter was unaware that the patient had been transferred to the emergency department. Daughter states that Ms. Lehrer was placed in [facility] two weeks ago and has been in isolation since she arrived at the facility. Previously she was at home with her daughter but due to progressive difficulties with ambulation and she needed placement. She currently uses a wheelchair and has baseline confusion.  She has chronic difficulties with the use of her RLE due to prior injuries.  She is prone to urinary tract infections. No reports of falls.    Past Medical History:  Diagnosis Date  . Blood transfusion without reported diagnosis   . Chronic back pain   . Chronic constipation   . Dysrhythmia    Afib with RVR - 2015, Oaroxyomal Afib  . GI bleed   . Glaucoma   . Syncope 2015  . Thyroid disease   . TIA (transient ischemic attack) 2015    Patient Active Problem List   Diagnosis Date Noted  . Supracondylar fracture of right femur, closed, initial encounter ([place]) 01/01/2018  . Closed fracture of  right distal femur ([place]) 12/24/2017  . Acute blood loss anemia 12/19/2017  . Malnutrition of moderate degree 12/18/2017  . Femur fracture ([place]) 12/17/2017  . Fall 12/17/2017  . HLD (hyperlipidemia) 12/17/2017  . Chronic anemia 12/17/2017  . Closed 4-part fracture of proximal humerus, right, initial encounter 10/09/2017  . Paroxysmal A-fib ([place]) 09/21/2017  . Closed displaced fracture of right femoral neck ([place]) 09/21/2017  . Closed fracture of right proximal humerus 09/21/2017  . Dehydration 09/21/2017  . Hyponatremia 09/21/2017  . Elevated CK 09/21/2017  . Thyroid nodule 09/21/2017  . Closed right hip fracture ([place]) 09/21/2017  . Chronic diastolic CHF (congestive heart failure) ([place]) 09/21/2017  . Sinus tachycardia 09/21/2017  . Acute lower UTI 09/21/2017  . TIA (transient ischemic attack) 08/25/2014  . Aphasia 08/25/2014  . Chronic constipation 05/19/2014  . Fracture of multiple pubic rami ([place]) 04/11/2014  . Pubic bone fracture ([place]) 04/11/2014  . Left hip pain 04/11/2014  . Atrial fibrillation with RVR ([place]) 03/28/2013  . Diarrhea 03/28/2013  . Hypothyroidism 03/24/2013  . Syncope 03/24/2013  . Glaucoma 03/24/2013  . Hypokalemia 03/24/2013  . Acute gastroenteritis 03/24/2013    Past Surgical History:  Procedure Laterality Date  . CATARACT EXTRACTION, BILATERAL    . COLONOSCOPY    . HIP ARTHROPLASTY Right 09/22/2017   Procedure: ARTHROPLASTY BIPOLAR HIP (HEMIARTHROPLASTY);  Surgeon: [name], MD;  Location: [facility];  Service: Orthopedics;  Laterality: Right;  . JOINT REPLACEMENT  11/20/2011   LTKR  . ORIF FEMUR FRACTURE Right 12/20/2017  Procedure: OPEN REDUCTION INTERNAL FIXATION (ORIF) DISTAL FEMUR FRACTURE;  Surgeon: [name], MD;  Location: [facility];  Service: Orthopedics;  Laterality: Right;  . REVERSE SHOULDER ARTHROPLASTY Right 10/09/2017  . REVERSE SHOULDER ARTHROPLASTY Right 10/09/2017   Procedure: REVERSE SHOULDER ARTHROPLASTY;  Surgeon: [name], MD;  Location: [facility];  Service: Orthopedics;  Laterality: Right;     OB History   No obstetric history on file.     Family History  Problem Relation Age of Onset  . Osteoporosis Mother   . Cancer - Colon Father   . Schizophrenia Son     Social History   Tobacco Use  . Smoking status: Never Smoker  . Smokeless tobacco: Never Used  Substance Use Topics  . Alcohol use: No  . Drug use: No    Home Medications Prior to Admission medications   Medication Sig Start Date End Date Taking? Authorizing Provider  aspirin EC 81 MG tablet Take 81 mg by mouth daily.   Yes [provider]  AZOPT 1 % ophthalmic suspension Place 1 drop into both eyes 2 (two) times daily. 09/25/18  Yes [provider]  brimonidine (ALPHAGAN) 0.2 % ophthalmic solution Place 1 drop into the left eye 2 (two) times daily. 09/10/18  Yes [provider]  docusate sodium (COLACE) 100 MG capsule Take 100 mg by mouth daily.   Yes [provider]  latanoprost (XALATAN) 0.005 % ophthalmic solution Place 1 drop into the left eye at bedtime. 09/10/18  Yes [provider]  levothyroxine (SYNTHROID) 75 MCG tablet Take 75 mcg by mouth daily. 09/16/18  Yes [provider]  metoprolol succinate (TOPROL-XL) 25 MG 24 hr tablet Take 25 mg by mouth daily.   Yes [provider]  TIMOPTIC OCUDOSE 0.25 % SOLN Place 1 drop into both eyes 2 (two) times daily. 09/27/18  Yes [provider]    Allergies    Aspirin, Penicillins, and Codeine  Review of Systems   Review of Systems  Neurological: Positive for weakness.  All other systems reviewed and are negative.   Physical Exam Updated Vital Signs BP 135/87   Pulse 83   Temp 98.3 F (36.8 C) (Oral)   Resp 13   SpO2 99%   Physical Exam Vitals and nursing note reviewed.  Constitutional:      Appearance: She is well-developed.  HENT:     Head: Normocephalic and atraumatic.  Cardiovascular:     Rate and  Rhythm: Normal rate and regular rhythm.     Heart sounds: No murmur.  Pulmonary:     Effort: Pulmonary effort is normal. No respiratory distress.     Breath sounds: Normal breath sounds.  Abdominal:     Palpations: Abdomen is soft.     Tenderness: There is no abdominal tenderness. There is no guarding or rebound.  Musculoskeletal:        General: No tenderness.     Comments: Nonpitting edema to BLE  Skin:    General: Skin is warm and dry.     Capillary Refill: Capillary refill takes less than 2 seconds.  Neurological:     Mental Status: She is alert and oriented to person, place, and time.     Comments: Oriented to person and place, mildly confused.  5/5 strength in BUE.  3/5 strength in RLE, 4/5 strength in LLE.  No asymmetry of facial muscles.  Visual fields grossly intact.  No pronator drift.  Sensation to light touch intact in  all four extremities.    Psychiatric:        Behavior: Behavior normal.     ED Results / Procedures / Treatments   Labs (all labs ordered are listed, but only abnormal results are displayed) Labs Reviewed  COMPREHENSIVE METABOLIC PANEL - Abnormal; Notable for the following components:      Result Value   Glucose, Bld 109 (*)    Calcium 8.8 (*)    Albumin 3.1 (*)    AST 13 (*)    All other components within normal limits  URINALYSIS, ROUTINE W REFLEX MICROSCOPIC - Abnormal; Notable for the following components:   APPearance HAZY (*)    Hgb urine dipstick MODERATE (*)    Bacteria, UA FEW (*)    All other components within normal limits  URINE CULTURE  CBC WITH DIFFERENTIAL/PLATELET    EKG EKG Interpretation  Date/Time:  Monday February 02 2019 08:09:46 EST Ventricular Rate:  89 PR Interval:    QRS Duration: 87 QT Interval:  371 QTC Calculation: 452 R Axis:   46 Text Interpretation: Sinus rhythm Atrial premature complexes Probable left atrial enlargement RSR' in V1 or V2, probably normal variant Confirmed by Quintella Reichert [phone]) on  02/02/2019 9:07:38 AM Also confirmed by Quintella Reichert [phone]), editor Hattie Perch ([id number])  on 02/02/2019 10:37:29 AM   Radiology DG Chest 1 View  Result Date: 02/02/2019 CLINICAL DATA:  Weakness EXAM: CHEST  1 VIEW COMPARISON:  Frontal radiograph from earlier today FINDINGS: In the lateral projection the lungs are clear. No pleural effusion. Normal heart size with aortic tortuosity. Chronic appearing wedging to upper thoracic and L1 vertebral bodies that is stable from 05/21/2018 lateral view. IMPRESSION: 1. Clear lungs in the lateral projection. 2. Chronic compression fractures. Electronically Signed   By: Monte Fantasia M.D.   On: 02/02/2019 11:51   CT Head Wo Contrast  Result Date: 02/02/2019 CLINICAL DATA:  86 year old female with history of altered mental status. Weakness for 1 hour. Nausea. EXAM: CT HEAD WITHOUT CONTRAST TECHNIQUE: Contiguous axial images were obtained from the base of the skull through the vertex without intravenous contrast. COMPARISON:  Head CT 09/21/2017. FINDINGS: Brain: Moderate cerebral and mild cerebellar atrophy. Patchy and confluent areas of decreased attenuation are noted throughout the deep and periventricular white matter of the cerebral hemispheres bilaterally, compatible with chronic microvascular ischemic disease. No evidence of acute infarction, hemorrhage, hydrocephalus, extra-axial collection or mass lesion/mass effect. Vascular: No hyperdense vessel or unexpected calcification. Skull: Normal. Negative for fracture or focal lesion. Sinuses/Orbits: No acute finding. Other: None. IMPRESSION: 1. No acute intracranial abnormalities. 2. Moderate cerebral and mild cerebellar atrophy with severe chronic microvascular ischemic changes in the cerebral white matter, as above. Electronically Signed   By: Vinnie Langton M.D.   On: 02/02/2019 11:00   DG Chest Port 1 View  Result Date: 02/02/2019 CLINICAL DATA:  New onset generalized weakness, altered mental  status, nausea. EXAM: PORTABLE CHEST 1 VIEW COMPARISON:  06/18/2018, 05/21/2018 and 09/21/2017. FINDINGS: Patient is slightly rotated. Trachea is midline. Heart size normal. Thoracic aorta is calcified. Added density in the medial right upper lobe may be due to patient rotation but appears new from prior exams. Lungs are otherwise clear. No pleural fluid. Right shoulder arthroplasty. Degenerative changes in the acromioclavicular joints bilaterally. Osteopenia. IMPRESSION: 1. Added density in the medial aspect of the right upper lobe may be due to patient rotation. Difficult to exclude a true pulmonary parenchymal lesion. Follow-up PA and lateral views of the chest or  CT chest with contrast recommended in further evaluation, as clinically indicated. 2. Otherwise, no acute findings in the chest. 3.  Aortic atherosclerosis (ICD10-I70.0). Electronically Signed   By: Lorin Picket M.D.   On: 02/02/2019 09:17    Procedures Procedures (including critical care time)  Medications Ordered in ED Medications - No data to display  ED Course  I have reviewed the triage vital signs and the nursing notes.  Pertinent labs & imaging results that were available during my care of the patient were reviewed by me and considered in my medical decision making (see chart for details).    MDM Rules/Calculators/A&P                     Patient presents from facility for evaluation of nausea and generalized weakness. Patient has no complaints on ED arrival. Facility report is generalized weakness. Patient does have weakness on examination but her daughter states that this is chronic and was the reason for placement and that she is non ambulatory at baseline. Urinalysis is not consistent with UTI. Current presentation is not consistent with acute CVA based on available information. Patient has no active nausea or vomiting in the emergency department and is able to tolerate orals. Plan to discharge back to facility for ongoing  care.  Final Clinical Impression(s) / ED Diagnoses Final diagnoses:  Generalized weakness  Nausea    Rx / DC Orders ED Discharge Orders    None       Quintella Reichert, MD 02/02/19 1258

## 2019-02-02 NOTE — ED Triage Notes (Signed)
Pt BIBA from [facility] on [address]  Per EMS- Pt called d/t generalized weakness x1 hour, nausea x1.    AOx4, ambulatory with assistance.   VS 142/72 96 HR 97 spO2  133 CBG  Hx of afib, NSR on arrival to ED>

## 2019-02-02 NOTE — ED Notes (Signed)
Called report to [place], Therapist, sports at [facility] on [address].

## 2019-02-02 NOTE — ED Notes (Signed)
PTAR notified of need for transport. 

## 2019-02-02 NOTE — ED Notes (Signed)
Patient transported to X-ray 

## 2019-02-03 LAB — URINE CULTURE: Culture: NO GROWTH

## 2019-02-11 ENCOUNTER — Ambulatory Visit: Payer: Medicare Other | Admitting: Orthopaedic Surgery

## 2019-11-25 IMAGING — DX DG CHEST 1V PORT
1 series · 1 of 1 positions shown · non-contrast
Comparison: 03/28/2013 and prior chest radiographs

CLINICAL DATA: Acute shortness of breath and chest pain today.

EXAM:
PORTABLE CHEST 1 VIEW

[chest]
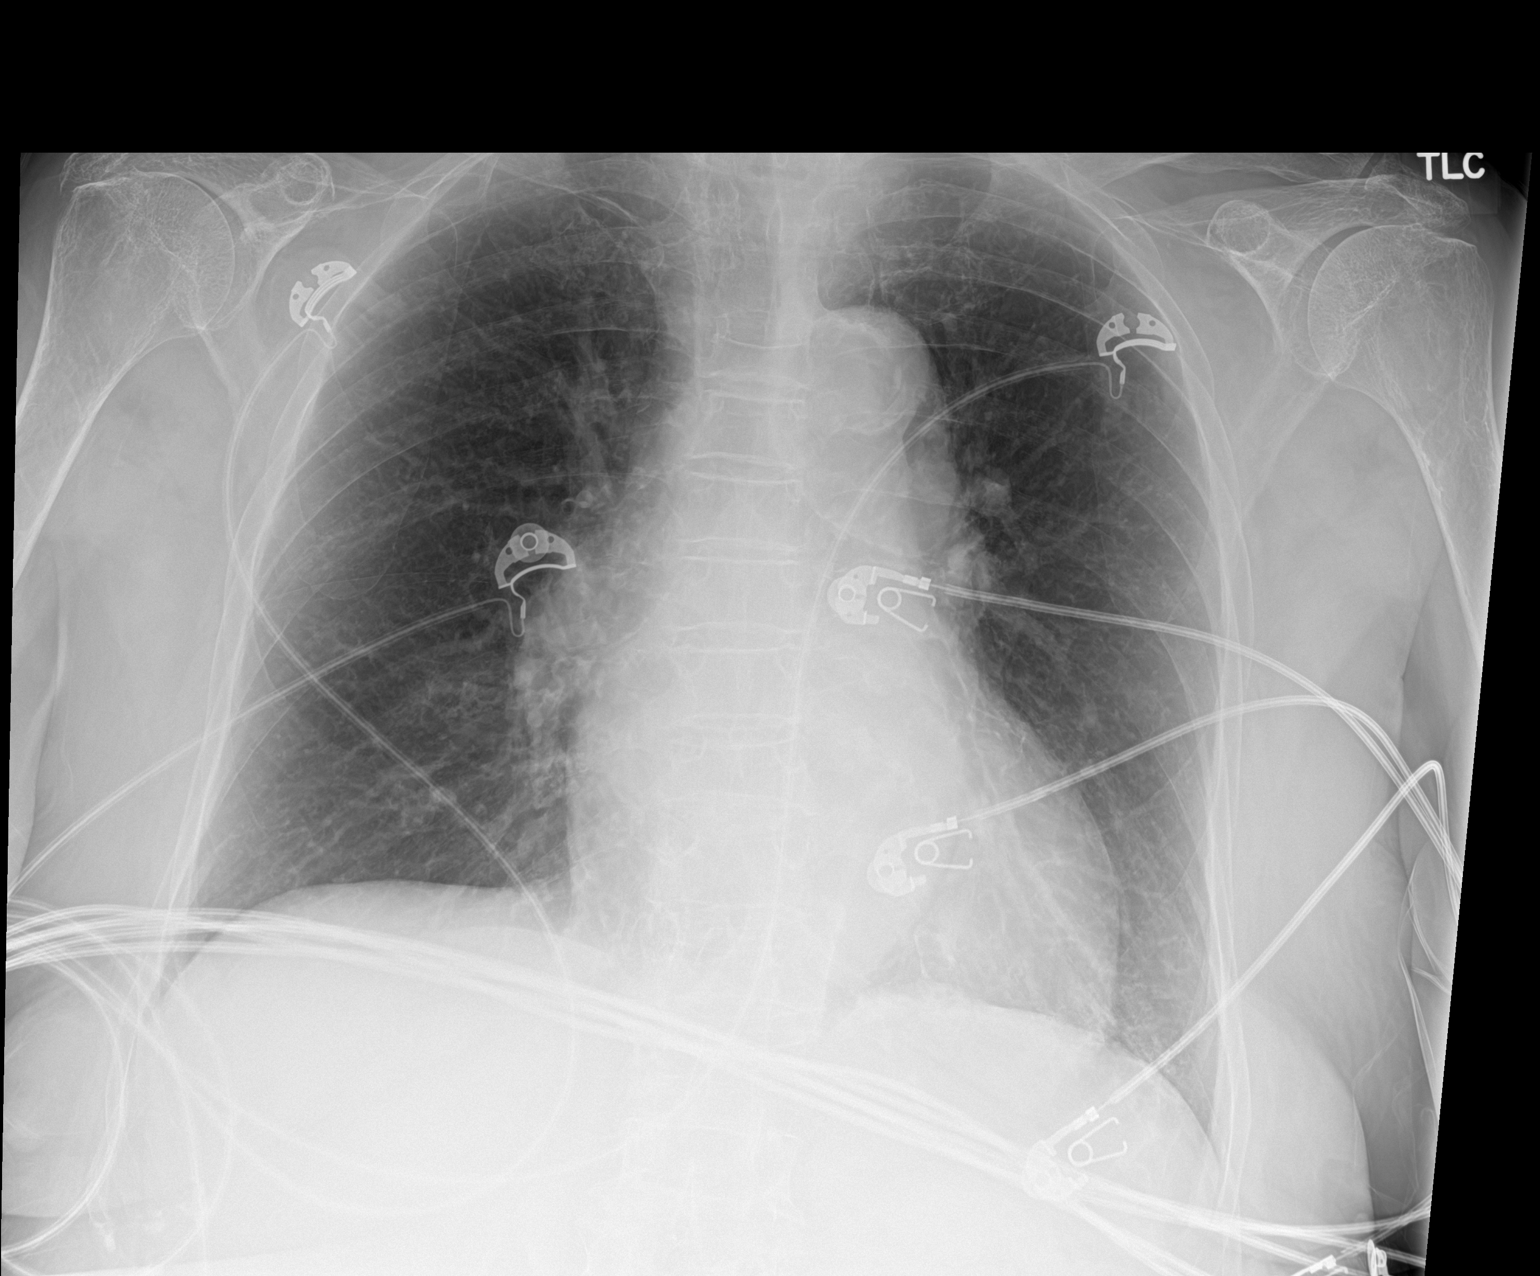

[1 of 1 positions shown; findings below may reference images not displayed]

FINDINGS: Cardiomegaly identified.

There is no evidence of focal airspace disease, pulmonary edema,
suspicious pulmonary nodule/mass, pleural effusion, or pneumothorax.

No acute bony abnormalities are identified.
IMPRESSION: Cardiomegaly without evidence of acute cardiopulmonary disease.

## 2019-11-28 ENCOUNTER — Other Ambulatory Visit: Payer: Self-pay

## 2019-11-28 ENCOUNTER — Observation Stay (HOSPITAL_COMMUNITY): Payer: Medicare Other

## 2019-11-28 ENCOUNTER — Emergency Department (HOSPITAL_COMMUNITY): Payer: Medicare Other

## 2019-11-28 ENCOUNTER — Inpatient Hospital Stay (HOSPITAL_COMMUNITY)
Admission: EM | Admit: 2019-11-28 | Discharge: 2019-11-30 | DRG: 312 | Disposition: A | Discharge status: Skilled Nursing Facility | Payer: Medicare Other | Attending: Internal Medicine | Admitting: Internal Medicine

## 2019-11-28 DIAGNOSIS — I5032 Chronic diastolic (congestive) heart failure: Secondary | ICD-10-CM | POA: Diagnosis present

## 2019-11-28 DIAGNOSIS — Z880 Allergy status to penicillin: Secondary | ICD-10-CM

## 2019-11-28 DIAGNOSIS — I951 Orthostatic hypotension: Secondary | ICD-10-CM | POA: Diagnosis not present

## 2019-11-28 DIAGNOSIS — Z885 Allergy status to narcotic agent status: Secondary | ICD-10-CM

## 2019-11-28 DIAGNOSIS — Z87440 Personal history of urinary (tract) infections: Secondary | ICD-10-CM

## 2019-11-28 DIAGNOSIS — D649 Anemia, unspecified: Secondary | ICD-10-CM | POA: Diagnosis present

## 2019-11-28 DIAGNOSIS — Z20822 Contact with and (suspected) exposure to covid-19: Secondary | ICD-10-CM | POA: Diagnosis present

## 2019-11-28 DIAGNOSIS — H409 Unspecified glaucoma: Secondary | ICD-10-CM | POA: Diagnosis present

## 2019-11-28 DIAGNOSIS — M549 Dorsalgia, unspecified: Secondary | ICD-10-CM | POA: Diagnosis present

## 2019-11-28 DIAGNOSIS — Z8673 Personal history of transient ischemic attack (TIA), and cerebral infarction without residual deficits: Secondary | ICD-10-CM

## 2019-11-28 DIAGNOSIS — E039 Hypothyroidism, unspecified: Secondary | ICD-10-CM | POA: Diagnosis present

## 2019-11-28 DIAGNOSIS — I480 Paroxysmal atrial fibrillation: Secondary | ICD-10-CM | POA: Diagnosis present

## 2019-11-28 DIAGNOSIS — I110 Hypertensive heart disease with heart failure: Secondary | ICD-10-CM | POA: Diagnosis present

## 2019-11-28 DIAGNOSIS — Z79899 Other long term (current) drug therapy: Secondary | ICD-10-CM

## 2019-11-28 DIAGNOSIS — G8929 Other chronic pain: Secondary | ICD-10-CM | POA: Diagnosis present

## 2019-11-28 DIAGNOSIS — Z79890 Hormone replacement therapy (postmenopausal): Secondary | ICD-10-CM

## 2019-11-28 DIAGNOSIS — Z7982 Long term (current) use of aspirin: Secondary | ICD-10-CM

## 2019-11-28 DIAGNOSIS — R55 Syncope and collapse: Secondary | ICD-10-CM | POA: Diagnosis present

## 2019-11-28 DIAGNOSIS — Z886 Allergy status to analgesic agent status: Secondary | ICD-10-CM

## 2019-11-28 DIAGNOSIS — E785 Hyperlipidemia, unspecified: Secondary | ICD-10-CM | POA: Diagnosis present

## 2019-11-28 LAB — CBG MONITORING, ED: Glucose-Capillary: 148 mg/dL — ABNORMAL HIGH (ref 70–99)

## 2019-11-28 LAB — CBC WITH DIFFERENTIAL/PLATELET
Abs Immature Granulocytes: 0.04 10*3/uL (ref 0.00–0.07)
Basophils Absolute: 0.1 10*3/uL (ref 0.0–0.1)
Basophils Relative: 1 %
Eosinophils Absolute: 0.1 10*3/uL (ref 0.0–0.5)
Eosinophils Relative: 1 %
HCT: 37.6 % (ref 36.0–46.0)
Hemoglobin: 11.7 g/dL — ABNORMAL LOW (ref 12.0–15.0)
Immature Granulocytes: 1 %
Lymphocytes Relative: 20 %
Lymphs Abs: 1.7 10*3/uL (ref 0.7–4.0)
MCH: 26.8 pg (ref 26.0–34.0)
MCHC: 31.1 g/dL (ref 30.0–36.0)
MCV: 86 fL (ref 80.0–100.0)
Monocytes Absolute: 0.7 10*3/uL (ref 0.1–1.0)
Monocytes Relative: 8 %
Neutro Abs: 5.8 10*3/uL (ref 1.7–7.7)
Neutrophils Relative %: 69 %
Platelets: 352 10*3/uL (ref 150–400)
RBC: 4.37 MIL/uL (ref 3.87–5.11)
RDW: 15.1 % (ref 11.5–15.5)
WBC: 8.4 10*3/uL (ref 4.0–10.5)
nRBC: 0 % (ref 0.0–0.2)

## 2019-11-28 LAB — VITAMIN B12: Vitamin B-12: 258 pg/mL (ref 180–914)

## 2019-11-28 LAB — COMPREHENSIVE METABOLIC PANEL
ALT: 10 U/L (ref 0–44)
AST: 14 U/L — ABNORMAL LOW (ref 15–41)
Albumin: 2.9 g/dL — ABNORMAL LOW (ref 3.5–5.0)
Alkaline Phosphatase: 98 U/L (ref 38–126)
Anion gap: 12 (ref 5–15)
BUN: 7 mg/dL — ABNORMAL LOW (ref 8–23)
CO2: 25 mmol/L (ref 22–32)
Calcium: 9 mg/dL (ref 8.9–10.3)
Chloride: 98 mmol/L (ref 98–111)
Creatinine, Ser: 0.7 mg/dL (ref 0.44–1.00)
GFR, Estimated: >60 mL/min (ref >60)
Glucose, Bld: 153 mg/dL — ABNORMAL HIGH (ref 70–99)
Potassium: 3.6 mmol/L (ref 3.5–5.1)
Sodium: 135 mmol/L (ref 135–145)
Total Bilirubin: 0.7 mg/dL (ref 0.3–1.2)
Total Protein: 6.8 g/dL (ref 6.5–8.1)

## 2019-11-28 LAB — RESPIRATORY PANEL BY RT PCR (FLU A&B, COVID)
Influenza A by PCR: NEGATIVE
Influenza B by PCR: NEGATIVE
SARS Coronavirus 2 by RT PCR: NEGATIVE

## 2019-11-28 LAB — TSH: TSH: 1.457 u[IU]/mL (ref 0.350–4.500)

## 2019-11-28 LAB — PHOSPHORUS: Phosphorus: 3.5 mg/dL (ref 2.5–4.6)

## 2019-11-28 LAB — MAGNESIUM: Magnesium: 1.8 mg/dL (ref 1.7–2.4)

## 2019-11-28 MED ORDER — LEVOTHYROXINE SODIUM 50 MCG PO TABS
50.0000 ug | ORAL_TABLET | Freq: Every day | ORAL | Status: DC
  Administered 2019-11-29 – 2019-11-30 (×2): 50 ug via ORAL
  Filled 2019-11-28 (×2): qty 1

## 2019-11-28 MED ORDER — METOPROLOL SUCCINATE ER 25 MG PO TB24
25.0000 mg | ORAL_TABLET | Freq: Every day | ORAL | Status: DC
  Administered 2019-11-29 – 2019-11-30 (×2): 25 mg via ORAL
  Filled 2019-11-28 (×2): qty 1

## 2019-11-28 MED ORDER — ONDANSETRON HCL 4 MG/2ML IJ SOLN: 4.0000 mg | Freq: Four times a day (QID) | INTRAMUSCULAR | Status: DC | PRN

## 2019-11-28 MED ORDER — ACETAMINOPHEN 325 MG PO TABS: 650.0000 mg | ORAL_TABLET | Freq: Four times a day (QID) | ORAL | Status: DC | PRN

## 2019-11-28 MED ORDER — ASPIRIN EC 81 MG PO TBEC
81.0000 mg | DELAYED_RELEASE_TABLET | Freq: Every day | ORAL | Status: DC
  Administered 2019-11-29 – 2019-11-30 (×2): 81 mg via ORAL
  Filled 2019-11-28 (×2): qty 1

## 2019-11-28 MED ORDER — TIMOLOL MALEATE 0.25 % OP SOLN
1.0000 [drp] | Freq: Two times a day (BID) | OPHTHALMIC | Status: DC
  Administered 2019-11-28 – 2019-11-30 (×4): 1 [drp] via OPHTHALMIC
  Filled 2019-11-28: qty 5

## 2019-11-28 MED ORDER — DOCUSATE SODIUM 100 MG PO CAPS
100.0000 mg | ORAL_CAPSULE | Freq: Every day | ORAL | Status: DC
  Administered 2019-11-28 – 2019-11-29 (×2): 100 mg via ORAL
  Filled 2019-11-28 (×2): qty 1

## 2019-11-28 MED ORDER — BRIMONIDINE TARTRATE 0.2 % OP SOLN
1.0000 [drp] | Freq: Two times a day (BID) | OPHTHALMIC | Status: DC
  Administered 2019-11-28 – 2019-11-30 (×4): 1 [drp] via OPHTHALMIC
  Filled 2019-11-28: qty 5

## 2019-11-28 MED ORDER — BRINZOLAMIDE 1 % OP SUSP
1.0000 [drp] | Freq: Two times a day (BID) | OPHTHALMIC | Status: DC
  Administered 2019-11-28 – 2019-11-30 (×4): 1 [drp] via OPHTHALMIC
  Filled 2019-11-28: qty 10

## 2019-11-28 MED ORDER — SODIUM CHLORIDE 0.9 % IV SOLN: INTRAVENOUS | Status: DC

## 2019-11-28 MED ORDER — ENOXAPARIN SODIUM 40 MG/0.4ML ~~LOC~~ SOLN
40.0000 mg | SUBCUTANEOUS | Status: DC
  Administered 2019-11-28 – 2019-11-30 (×3): 40 mg via SUBCUTANEOUS
  Filled 2019-11-28 (×3): qty 0.4

## 2019-11-28 MED ORDER — ONDANSETRON HCL 4 MG PO TABS: 4.0000 mg | ORAL_TABLET | Freq: Four times a day (QID) | ORAL | Status: DC | PRN

## 2019-11-28 MED ORDER — ACETAMINOPHEN 650 MG RE SUPP: 650.0000 mg | Freq: Four times a day (QID) | RECTAL | Status: DC | PRN

## 2019-11-28 MED ORDER — SODIUM CHLORIDE 0.9% FLUSH
3.0000 mL | Freq: Two times a day (BID) | INTRAVENOUS | Status: DC
  Administered 2019-11-28 – 2019-11-29 (×2): 3 mL via INTRAVENOUS

## 2019-11-28 MED ORDER — FUROSEMIDE 20 MG PO TABS
20.0000 mg | ORAL_TABLET | Freq: Every day | ORAL | Status: DC
  Filled 2019-11-28: qty 1

## 2019-11-28 MED ORDER — SODIUM CHLORIDE 0.9 % IV BOLUS
500.0000 mL | Freq: Once | INTRAVENOUS | Status: AC
  Administered 2019-11-28: 500 mL via INTRAVENOUS

## 2019-11-28 MED ORDER — LATANOPROST 0.005 % OP SOLN
1.0000 [drp] | Freq: Every day | OPHTHALMIC | Status: DC
  Administered 2019-11-28 – 2019-11-29 (×2): 1 [drp] via OPHTHALMIC
  Filled 2019-11-28: qty 2.5

## 2019-11-28 MED ORDER — ONDANSETRON HCL 4 MG/2ML IJ SOLN
4.0000 mg | Freq: Once | INTRAMUSCULAR | Status: AC
  Administered 2019-11-28: 4 mg via INTRAVENOUS
  Filled 2019-11-28: qty 2

## 2019-11-28 NOTE — Plan of Care (Signed)
  Problem: Safety: Goal: Ability to remain free from injury will improve Outcome: Progressing   

## 2019-11-28 NOTE — ED Notes (Signed)
Patient transported to CT via stretcher in stable condition 

## 2019-11-28 NOTE — Progress Notes (Addendum)
NEW ADMISSION NOTE New Admission Note:   Arrival Method: E.D. Stretcher bed Mental Orientation: Alert and oriented  Telemetry # 12 Assessment: Completed Skin:Dry and flaky otherwise intact,as assessed with Allyson R.N. IV: Left hand Pain: Denies Tubes:None Safety Measures: Safety Fall Prevention Plan has been given, discussed and signed Admission: Completed [facility] [place] Orientation: Patient has been orientated to the room, unit and staff.  Family:None  At the bedside.  Orders have been reviewed and implemented. Will continue to monitor the patient. Call light has been placed within reach and bed alarm has been activated.   [place], Zenon Mayo, RN

## 2019-11-28 NOTE — ED Provider Notes (Signed)
[facility] EMERGENCY DEPARTMENT Provider Note   CSN: [id number] Arrival date & time: 11/28/19  7591     History Chief Complaint  Patient presents with  . Loss of Consciousness    controlled slide from w/c per staff    Stephanie Donovan is a 86 y.o. female.  Patient brought in from [place].  Patient had a syncopal episode that was witnessed while in a wheelchair.  Staff assisted her to the floor.  Patient actively vomiting on arrival.  By the time I saw her no further vomiting but said she did not feel well.  Patient was pale.  Denied any pain.  Cardiac monitor was suggestive of atrial fib.  Patient without known history of syncope.  But patient's daughter eventually arrived and stated that many many years ago she did.   Patient denies any history of headache shortness of breath or chest pain.  No abdominal pain.        Past Medical History:  Diagnosis Date  . Blood transfusion without reported diagnosis   . Chronic back pain   . Chronic constipation   . Dysrhythmia    Afib with RVR - 2015, Oaroxyomal Afib  . GI bleed   . Glaucoma   . Syncope 2015  . Thyroid disease   . TIA (transient ischemic attack) 2015    Patient Active Problem List   Diagnosis Date Noted  . Supracondylar fracture of right femur, closed, initial encounter ([place]) 01/01/2018  . Closed fracture of right distal femur ([place]) 12/24/2017  . Acute blood loss anemia 12/19/2017  . Malnutrition of moderate degree 12/18/2017  . Femur fracture ([place]) 12/17/2017  . Fall 12/17/2017  . HLD (hyperlipidemia) 12/17/2017  . Chronic anemia 12/17/2017  . Closed 4-part fracture of proximal humerus, right, initial encounter 10/09/2017  . Paroxysmal A-fib ([place]) 09/21/2017  . Closed displaced fracture of right femoral neck ([place]) 09/21/2017  . Closed fracture of right proximal humerus 09/21/2017  . Dehydration 09/21/2017  . Hyponatremia 09/21/2017  . Elevated CK 09/21/2017  .  Thyroid nodule 09/21/2017  . Closed right hip fracture ([place]) 09/21/2017  . Chronic diastolic CHF (congestive heart failure) ([place]) 09/21/2017  . Sinus tachycardia 09/21/2017  . Acute lower UTI 09/21/2017  . TIA (transient ischemic attack) 08/25/2014  . Aphasia 08/25/2014  . Chronic constipation 05/19/2014  . Fracture of multiple pubic rami ([place]) 04/11/2014  . Pubic bone fracture ([place]) 04/11/2014  . Left hip pain 04/11/2014  . Atrial fibrillation with RVR ([place]) 03/28/2013  . Diarrhea 03/28/2013  . Hypothyroidism 03/24/2013  . Syncope 03/24/2013  . Glaucoma 03/24/2013  . Hypokalemia 03/24/2013  . Acute gastroenteritis 03/24/2013    Past Surgical History:  Procedure Laterality Date  . CATARACT EXTRACTION, BILATERAL    . COLONOSCOPY    . HIP ARTHROPLASTY Right 09/22/2017   Procedure: ARTHROPLASTY BIPOLAR HIP (HEMIARTHROPLASTY);  Surgeon: [name], MD;  Location: [place];  Service: Orthopedics;  Laterality: Right;  . JOINT REPLACEMENT  11/20/2011   LTKR  . ORIF FEMUR FRACTURE Right 12/20/2017   Procedure: OPEN REDUCTION INTERNAL FIXATION (ORIF) DISTAL FEMUR FRACTURE;  Surgeon: [name], MD;  Location: [place];  Service: Orthopedics;  Laterality: Right;  . REVERSE SHOULDER ARTHROPLASTY Right 10/09/2017  . REVERSE SHOULDER ARTHROPLASTY Right 10/09/2017   Procedure: REVERSE SHOULDER ARTHROPLASTY;  Surgeon: [name], MD;  Location: [facility];  Service: Orthopedics;  Laterality: Right;     OB History   No obstetric history on file.  Family History  Problem Relation Age of Onset  . Osteoporosis Mother   . Cancer - Colon Father   . Schizophrenia Son     Social History   Tobacco Use  . Smoking status: Never Smoker  . Smokeless tobacco: Never Used  Vaping Use  . Vaping Use: Never used  Substance Use Topics  . Alcohol use: No  . Drug use: No    Home Medications Prior to Admission medications   Medication Sig Start Date End Date Taking? Authorizing  Provider  aspirin EC 81 MG tablet Take 81 mg by mouth daily.    [provider]  AZOPT 1 % ophthalmic suspension Place 1 drop into both eyes 2 (two) times daily. 09/25/18   [provider]  brimonidine (ALPHAGAN) 0.2 % ophthalmic solution Place 1 drop into the left eye 2 (two) times daily. 09/10/18   [provider]  docusate sodium (COLACE) 100 MG capsule Take 100 mg by mouth daily.    [provider]  latanoprost (XALATAN) 0.005 % ophthalmic solution Place 1 drop into the left eye at bedtime. 09/10/18   [provider]  levothyroxine (SYNTHROID) 75 MCG tablet Take 75 mcg by mouth daily. 09/16/18   [provider]  metoprolol succinate (TOPROL-XL) 25 MG 24 hr tablet Take 25 mg by mouth daily.    [provider]  TIMOPTIC OCUDOSE 0.25 % SOLN Place 1 drop into both eyes 2 (two) times daily. 09/27/18   [provider]    Allergies    Aspirin, Penicillins, and Codeine  Review of Systems   Review of Systems  Constitutional: Negative for chills and fever.  HENT: Negative for rhinorrhea and sore throat.   Eyes: Negative for visual disturbance.  Respiratory: Negative for cough and shortness of breath.   Cardiovascular: Negative for chest pain and leg swelling.  Gastrointestinal: Positive for nausea and vomiting. Negative for abdominal pain and diarrhea.  Genitourinary: Negative for dysuria.  Musculoskeletal: Negative for back pain and neck pain.  Skin: Negative for rash.  Neurological: Positive for syncope. Negative for dizziness, light-headedness and headaches.  Hematological: Does not bruise/bleed easily.  Psychiatric/Behavioral: Negative for confusion.    Physical Exam Updated Vital Signs BP 134/61   Pulse 73   Temp 98.5 F (36.9 C) (Oral)   Resp (!) 23   Ht 1.626 m (5\' 4" )   Wt 54.4 kg   SpO2 100%   BMI 20.60 kg/m   Physical Exam Vitals and nursing note reviewed.  Constitutional:      General: She is not in  acute distress.    Appearance: Normal appearance. She is well-developed.  HENT:     Head: Normocephalic and atraumatic.  Eyes:     Extraocular Movements: Extraocular movements intact.     Conjunctiva/sclera: Conjunctivae normal.     Pupils: Pupils are equal, round, and reactive to light.  Cardiovascular:     Rate and Rhythm: Normal rate and regular rhythm.     Heart sounds: No murmur heard.   Pulmonary:     Effort: Pulmonary effort is normal. No respiratory distress.     Breath sounds: Normal breath sounds.  Abdominal:     General: There is no distension.     Palpations: Abdomen is soft.  Musculoskeletal:        General: Normal range of motion.     Cervical back: Neck supple.  Skin:    General: Skin is warm and dry.     Capillary Refill: Capillary refill  takes less than 2 seconds.     Coloration: Skin is pale.  Neurological:     General: No focal deficit present.     Mental Status: She is alert and oriented to person, place, and time.     Cranial Nerves: No cranial nerve deficit.     Sensory: No sensory deficit.     ED Results / Procedures / Treatments   Labs (all labs ordered are listed, but only abnormal results are displayed) Labs Reviewed  COMPREHENSIVE METABOLIC PANEL - Abnormal; Notable for the following components:      Result Value   Glucose, Bld 153 (*)    BUN 7 (*)    Albumin 2.9 (*)    AST 14 (*)    All other components within normal limits  CBC WITH DIFFERENTIAL/PLATELET - Abnormal; Notable for the following components:   Hemoglobin 11.7 (*)    All other components within normal limits  CBG MONITORING, ED - Abnormal; Notable for the following components:   Glucose-Capillary 148 (*)    All other components within normal limits  RESPIRATORY PANEL BY RT PCR (FLU A&B, COVID)    EKG EKG Interpretation  Date/Time:  Saturday November 28 2019 09:24:50 EDT Ventricular Rate:  73 PR Interval:    QRS Duration: 88 QT Interval:  403 QTC Calculation: 445 R  Axis:   20 Text Interpretation: Sinus rhythm Atrial premature complex No significant change since last tracing Reconfirmed by Fredia Sorrow [phone]) on 11/28/2019 11:31:00 AM   Radiology DG Chest Port 1 View  Result Date: 11/28/2019 CLINICAL DATA:  Syncope. EXAM: PORTABLE CHEST 1 VIEW COMPARISON:  02/02/2019 FINDINGS: Normal heart size. Aortic atherosclerotic calcifications. No pleural effusion or edema. No airspace densities identified. The bones appear diffusely osteopenic. Similar appearance of compression deformities within the thoracic spine. Previous right shoulder arthroplasty. IMPRESSION: No acute cardiopulmonary abnormalities. Electronically Signed   By: Kerby Moors M.D.   On: 11/28/2019 11:15    Procedures Procedures (including critical care time)  Medications Ordered in ED Medications  0.9 %  sodium chloride infusion ( Intravenous New Bag/Given 11/28/19 1132)  sodium chloride 0.9 % bolus 500 mL (0 mLs Intravenous Stopped 11/28/19 1126)  ondansetron (ZOFRAN) injection 4 mg (4 mg Intravenous Given 11/28/19 1037)    ED Course  I have reviewed the triage vital signs and the nursing notes.  Pertinent labs & imaging results that were available during my care of the patient were reviewed by me and considered in my medical decision making (see chart for details).    MDM Rules/Calculators/A&P                          Patient patient's neuro exam without any acute findings or any focal deficits.  Patient will require admission for the syncopal episode and cardiac monitoring.  Discussed with the hospitalist who will admit.  Patient's labs without any significant abnormality.  Patient feeling much better here after IV fluids and antinausea medicine.      Final Clinical Impression(s) / ED Diagnoses Final diagnoses:  Syncope, unspecified syncope type    Rx / DC Orders ED Discharge Orders    None       Fredia Sorrow, MD 11/28/19 1507

## 2019-11-28 NOTE — ED Triage Notes (Signed)
Per MEDIC pt syncopal at facility while in W/C. Staff assisted to floor. Denies any pain. Actively vomiting on arrival. Pt is pale.  Afib on monitor, hx of same

## 2019-11-28 NOTE — H&P (Signed)
History and Physical    Stephanie Donovan VQQ:[id number] DOB: 08/14/1927 DOA: 11/28/2019  PCP: Stephanie Mires, MD (Confirm with patient/family/NH records and if not entered, this has to be entered at [facility] point of entry) Patient coming from: [place] home  I have personally briefly reviewed patient's old medical records in [place]  Chief Complaint: I do not remember what happened  HPI: Stephanie Donovan is a 86 y.o. female with medical history significant of hypertension, history of paroxysmal A. fib, recurrent syncope, TIA, presented with syncope episode.  Patient cannot recall anything before or after the event.  I tried to reach patient told her x2, she was not picking up.  When I tried to contact nursing home Stephanie Donovan [phone]), front desk personnel told me that daytime team already off duty and went home for the day.  According to ER record, patient had a witnessed syncope while sitting on wheelchair when she slipped down to the floor.  Not sure whether she hit her head during the process.  There was no tongue biting, patient denied any headache chest pain shortness of breath lightheadedness.  But reported patient had vomited stomach content x1 after the event.  No detailed report about how patient looked like during the event or how much time patient was unresponsive.  Patient denied any chest pain shortness of breath lightheaded with blurry vision, urinary symptoms or diarrhea.  She says been wearing compression stockings since Monday this week. ED Course: Physical exam showed no focal neuro deficit, blood pressure and heart rate stable.  WBC 8.4, creatinine 0.7, potassium 3.6.  EKG, no PR or QTc interval initial.  Review of Systems: As per HPI otherwise 10 point review of systems negative.    Past Medical History:  Diagnosis Date  . Blood transfusion without reported diagnosis   . Chronic back pain   . Chronic constipation   . Dysrhythmia    Afib with RVR -  2015, Oaroxyomal Afib  . GI bleed   . Glaucoma   . Syncope 2015  . Thyroid disease   . TIA (transient ischemic attack) 2015    Past Surgical History:  Procedure Laterality Date  . CATARACT EXTRACTION, BILATERAL    . COLONOSCOPY    . HIP ARTHROPLASTY Right 09/22/2017   Procedure: ARTHROPLASTY BIPOLAR HIP (HEMIARTHROPLASTY);  Surgeon: [name], MD;  Location: [facility];  Service: Orthopedics;  Laterality: Right;  . JOINT REPLACEMENT  11/20/2011   LTKR  . ORIF FEMUR FRACTURE Right 12/20/2017   Procedure: OPEN REDUCTION INTERNAL FIXATION (ORIF) DISTAL FEMUR FRACTURE;  Surgeon: [name], MD;  Location: [place];  Service: Orthopedics;  Laterality: Right;  . REVERSE SHOULDER ARTHROPLASTY Right 10/09/2017  . REVERSE SHOULDER ARTHROPLASTY Right 10/09/2017   Procedure: REVERSE SHOULDER ARTHROPLASTY;  Surgeon: [name], MD;  Location: [facility];  Service: Orthopedics;  Laterality: Right;     reports that she has never smoked. She has never used smokeless tobacco. She reports that she does not drink alcohol and does not use drugs.  Allergies  Allergen Reactions  . Aspirin Palpitations and Other (See Comments)    Unspecified Mixed reactions  . Penicillins Other (See Comments)    Lost hearing temporarily DID THE REACTION INVOLVE: Swelling of the face/tongue/throat, SOB, or low BP? No Sudden or severe rash/hives, skin peeling, or the inside of the mouth or nose? No Did it require medical treatment? No When did it last happen? If all above answers are "NO", may  proceed with cephalosporin use.   . Codeine Palpitations    Family History  Problem Relation Age of Onset  . Osteoporosis Mother   . Cancer - Colon Father   . Schizophrenia Son     Prior to Admission medications   Medication Sig Start Date End Date Taking? Authorizing Provider  aspirin EC 81 MG tablet Take 81 mg by mouth daily.   Yes [provider]  AZOPT 1 % ophthalmic suspension Place 1 drop into  both eyes 2 (two) times daily. 09/25/18  Yes [provider]  brimonidine (ALPHAGAN) 0.2 % ophthalmic solution Place 1 drop into the left eye 2 (two) times daily. 09/10/18  Yes [provider]  docusate sodium (COLACE) 100 MG capsule Take 100 mg by mouth daily.   Yes [provider]  furosemide (LASIX) 20 MG tablet Take 20 mg by mouth daily.   Yes [provider]  latanoprost (XALATAN) 0.005 % ophthalmic solution Place 1 drop into the left eye at bedtime. 09/10/18  Yes [provider]  levothyroxine (SYNTHROID) 75 MCG tablet Take 50 mcg by mouth daily.  09/16/18  Yes [provider]  metoprolol succinate (TOPROL-XL) 25 MG 24 hr tablet Take 25 mg by mouth daily.   Yes [provider]  potassium chloride (KLOR-CON) 10 MEQ tablet Take 10 mEq by mouth daily.   Yes [provider]  TIMOPTIC OCUDOSE 0.25 % SOLN Place 1 drop into both eyes 2 (two) times daily. 09/27/18  Yes [provider]    Physical Exam: Vitals:   11/28/19 1400 11/28/19 1415 11/28/19 1430 11/28/19 1605  BP: (!) 145/80 129/75 139/77 (!) 147/83  Pulse: 94 77 77 80  Resp: 16 19 15 17   Temp:      TempSrc:      SpO2: 99% 98% 100% 96%  Weight:      Height:        Constitutional: NAD, calm, comfortable Vitals:   11/28/19 1400 11/28/19 1415 11/28/19 1430 11/28/19 1605  BP: (!) 145/80 129/75 139/77 (!) 147/83  Pulse: 94 77 77 80  Resp: 16 19 15 17   Temp:      TempSrc:      SpO2: 99% 98% 100% 96%  Weight:      Height:       Eyes: PERRL, lids and conjunctivae normal ENMT: Mucous membranes are moist. Posterior pharynx clear of any exudate or lesions.Normal dentition.  Neck: normal, supple, no masses, no thyromegaly Respiratory: clear to auscultation bilaterally, no wheezing, no crackles. Normal respiratory effort. No accessory muscle use.  Cardiovascular: Regular rate and rhythm, soft murmur heart base. 2+ extremity edema. 2+ pedal pulses. No carotid  bruits.  Abdomen: no tenderness, no masses palpated. No hepatosplenomegaly. Bowel sounds positive.  Musculoskeletal: no clubbing / cyanosis. No joint deformity upper and lower extremities. Good ROM, no contractures. Normal muscle tone.  Skin: no rashes, lesions, ulcers. No induration Neurologic: CN 2-12 grossly intact. Sensation intact, DTR normal. Strength 5/5 in all 4.  Psychiatric: Normal judgment and insight. Alert and oriented x 3. Normal mood.    Labs on Admission: I have personally reviewed following labs and imaging studies  CBC: Recent Labs  Lab 11/28/19 1033  WBC 8.4  NEUTROABS 5.8  HGB 11.7*  HCT 37.6  MCV 86.0  PLT 235   Basic Metabolic Panel: Recent Labs  Lab 11/28/19 1033  NA 135  K 3.6  CL 98  CO2 25  GLUCOSE 153*  BUN 7*  CREATININE 0.70  CALCIUM 9.0   GFR: Estimated Creatinine Clearance: 40.1 mL/min (by C-G formula based on SCr of 0.7 mg/dL). Liver Function Tests: Recent Labs  Lab 11/28/19 1033  AST 14*  ALT 10  ALKPHOS 98  BILITOT 0.7  PROT 6.8  ALBUMIN 2.9*   No results for input(s): LIPASE, AMYLASE in the last 168 hours. No results for input(s): AMMONIA in the last 168 hours. Coagulation Profile: No results for input(s): INR, PROTIME in the last 168 hours. Cardiac Enzymes: No results for input(s): CKTOTAL, CKMB, CKMBINDEX, TROPONINI in the last 168 hours. BNP (last 3 results) No results for input(s): PROBNP in the last 8760 hours. HbA1C: No results for input(s): HGBA1C in the last 72 hours. CBG: Recent Labs  Lab 11/28/19 0930  GLUCAP 148*   Lipid Profile: No results for input(s): CHOL, HDL, LDLCALC, TRIG, CHOLHDL, LDLDIRECT in the last 72 hours. Thyroid Function Tests: No results for input(s): TSH, T4TOTAL, FREET4, T3FREE, THYROIDAB in the last 72 hours. Anemia Panel: No results for input(s): VITAMINB12, FOLATE, FERRITIN, TIBC, IRON, RETICCTPCT in the last 72 hours. Urine analysis:    Component Value Date/Time   COLORURINE  YELLOW 02/02/2019 1132   APPEARANCEUR HAZY (A) 02/02/2019 1132   LABSPEC 1.008 02/02/2019 1132   PHURINE 8.0 02/02/2019 1132   GLUCOSEU NEGATIVE 02/02/2019 1132   HGBUR MODERATE (A) 02/02/2019 1132   BILIRUBINUR NEGATIVE 02/02/2019 1132   KETONESUR NEGATIVE 02/02/2019 1132   PROTEINUR NEGATIVE 02/02/2019 1132   UROBILINOGEN 0.2 08/25/2014 0454   NITRITE NEGATIVE 02/02/2019 1132   LEUKOCYTESUR NEGATIVE 02/02/2019 1132    Radiological Exams on Admission: DG Chest Port 1 View  Result Date: 11/28/2019 CLINICAL DATA:  Syncope. EXAM: PORTABLE CHEST 1 VIEW COMPARISON:  02/02/2019 FINDINGS: Normal heart size. Aortic atherosclerotic calcifications. No pleural effusion or edema. No airspace densities identified. The bones appear diffusely osteopenic. Similar appearance of compression deformities within the thoracic spine. Previous right shoulder arthroplasty. IMPRESSION: No acute cardiopulmonary abnormalities. Electronically Signed   By: Kerby Moors M.D.   On: 11/28/2019 11:15    EKG: Independently reviewed.  No PR or QTC interval issue  Assessment/Plan Active Problems:   Syncope  (please populate well all problems here in Problem List. (For example, if patient is on BP meds at home and you resume or decide to hold them, it is a problem that needs to be her. Same for CAD, COPD, HLD and so on)  Syncope -Given her history of syncope documented history, this likely is recurrent syncope.  Given the fact the patient has vomiting after syncope, suspect this is vasovagal syncope.  No signs of dehydration or hypotension now, will check orthostatic vital signs. -Unable to reach nursing home staff to discuss the syncope episode where the patient hit her head, will order a CT to rule out any intracranial etiology.. -Telemetry monitoring -Echo -If study negative, expect patient discharged back to nursing home and follow-up with cardiology for outpatient Holter monitoring.  History of paroxysmal A.  Fib -EKG sinus, hospitalization record from 2019.  The patient has a history of paroxysmal A. fib but refused anticoagulation. -Continue metoprolol  Hypothyroidism -Check TSH, B12 and RPR, continue Synthroid  DVT prophylaxis: Lovenox Code Status: Full code Family Communication: Tried to reach patient daughter Disposition Plan: Expect less than 2 midnight hospital stay Consults called: None Admission status: Telemetry observation   Lequita Halt MD [facility] Pager [phone]  11/28/2019, 4:11 PM

## 2019-11-28 NOTE — ED Notes (Signed)
Patient continues to sleep. Denies any complaints when awakened.

## 2019-11-28 NOTE — ED Notes (Addendum)
Daughter at bedside. Patient care explained in full, denies any questions at this time.

## 2019-11-29 ENCOUNTER — Observation Stay (HOSPITAL_COMMUNITY): Payer: Medicare Other

## 2019-11-29 DIAGNOSIS — I951 Orthostatic hypotension: Secondary | ICD-10-CM | POA: Diagnosis present

## 2019-11-29 DIAGNOSIS — R55 Syncope and collapse: Secondary | ICD-10-CM

## 2019-11-29 DIAGNOSIS — Z885 Allergy status to narcotic agent status: Secondary | ICD-10-CM | POA: Diagnosis not present

## 2019-11-29 DIAGNOSIS — D649 Anemia, unspecified: Secondary | ICD-10-CM | POA: Diagnosis present

## 2019-11-29 DIAGNOSIS — Z8673 Personal history of transient ischemic attack (TIA), and cerebral infarction without residual deficits: Secondary | ICD-10-CM | POA: Diagnosis not present

## 2019-11-29 DIAGNOSIS — H409 Unspecified glaucoma: Secondary | ICD-10-CM | POA: Diagnosis present

## 2019-11-29 DIAGNOSIS — Z7982 Long term (current) use of aspirin: Secondary | ICD-10-CM | POA: Diagnosis not present

## 2019-11-29 DIAGNOSIS — Z79899 Other long term (current) drug therapy: Secondary | ICD-10-CM | POA: Diagnosis not present

## 2019-11-29 DIAGNOSIS — E039 Hypothyroidism, unspecified: Secondary | ICD-10-CM | POA: Diagnosis present

## 2019-11-29 DIAGNOSIS — I5032 Chronic diastolic (congestive) heart failure: Secondary | ICD-10-CM | POA: Diagnosis present

## 2019-11-29 DIAGNOSIS — Z87440 Personal history of urinary (tract) infections: Secondary | ICD-10-CM | POA: Diagnosis not present

## 2019-11-29 DIAGNOSIS — R609 Edema, unspecified: Secondary | ICD-10-CM | POA: Diagnosis not present

## 2019-11-29 DIAGNOSIS — M549 Dorsalgia, unspecified: Secondary | ICD-10-CM | POA: Diagnosis present

## 2019-11-29 DIAGNOSIS — E785 Hyperlipidemia, unspecified: Secondary | ICD-10-CM | POA: Diagnosis present

## 2019-11-29 DIAGNOSIS — I480 Paroxysmal atrial fibrillation: Secondary | ICD-10-CM | POA: Diagnosis present

## 2019-11-29 DIAGNOSIS — Z79890 Hormone replacement therapy (postmenopausal): Secondary | ICD-10-CM | POA: Diagnosis not present

## 2019-11-29 DIAGNOSIS — G8929 Other chronic pain: Secondary | ICD-10-CM | POA: Diagnosis present

## 2019-11-29 DIAGNOSIS — Z886 Allergy status to analgesic agent status: Secondary | ICD-10-CM | POA: Diagnosis not present

## 2019-11-29 DIAGNOSIS — Z880 Allergy status to penicillin: Secondary | ICD-10-CM | POA: Diagnosis not present

## 2019-11-29 DIAGNOSIS — I110 Hypertensive heart disease with heart failure: Secondary | ICD-10-CM | POA: Diagnosis present

## 2019-11-29 DIAGNOSIS — Z20822 Contact with and (suspected) exposure to covid-19: Secondary | ICD-10-CM | POA: Diagnosis present

## 2019-11-29 LAB — ECHOCARDIOGRAM COMPLETE
AR max vel: 1.99 cm2
AV Area VTI: 2.08 cm2
AV Area mean vel: 2.07 cm2
AV Mean grad: 2.2 mmHg
AV Peak grad: 4.3 mmHg
Ao pk vel: 1.04 m/s
Area-P 1/2: 3.83 cm2
Height: 64 in
S' Lateral: 3.9 cm
Single Plane A4C EF: 51.3 %
Weight: 1918.88 oz

## 2019-11-29 LAB — RPR: RPR Ser Ql: NONREACTIVE

## 2019-11-29 LAB — URINALYSIS, ROUTINE W REFLEX MICROSCOPIC
Bilirubin Urine: NEGATIVE
Glucose, UA: NEGATIVE mg/dL
Ketones, ur: NEGATIVE mg/dL
Nitrite: NEGATIVE
Protein, ur: NEGATIVE mg/dL
Specific Gravity, Urine: 1.009 (ref 1.005–1.030)
pH: 7 (ref 5.0–8.0)

## 2019-11-29 LAB — GLUCOSE, CAPILLARY: Glucose-Capillary: 87 mg/dL (ref 70–99)

## 2019-11-29 LAB — TROPONIN I (HIGH SENSITIVITY): Troponin I (High Sensitivity): 5 ng/L (ref <18)

## 2019-11-29 MED ORDER — SODIUM CHLORIDE 0.9 % IV SOLN: INTRAVENOUS | Status: DC

## 2019-11-29 MED ORDER — SODIUM CHLORIDE 0.9 % IV SOLN: INTRAVENOUS | Status: AC

## 2019-11-29 MED ORDER — MAGNESIUM OXIDE 400 (241.3 MG) MG PO TABS
400.0000 mg | ORAL_TABLET | Freq: Two times a day (BID) | ORAL | Status: DC
  Administered 2019-11-29 – 2019-11-30 (×3): 400 mg via ORAL
  Filled 2019-11-29 (×3): qty 1

## 2019-11-29 MED ORDER — VITAMIN B-12 100 MCG PO TABS
100.0000 ug | ORAL_TABLET | Freq: Every day | ORAL | Status: DC
  Administered 2019-11-29 – 2019-11-30 (×2): 100 ug via ORAL
  Filled 2019-11-29 (×2): qty 1

## 2019-11-29 NOTE — Progress Notes (Signed)
VASCULAR LAB    Bilateral lower extremity venous duplex has been performed.  See CV proc for preliminary results.   Konnie Noffsinger, RVT 11/29/2019, 10:07 AM

## 2019-11-29 NOTE — Progress Notes (Signed)
  Echocardiogram 2D Echocardiogram has been performed.  Stephanie Donovan 11/29/2019, 10:17 AM

## 2019-11-29 NOTE — Evaluation (Signed)
Physical Therapy Evaluation Patient Details Name: Stephanie Donovan MRN: [id number] DOB: 19-Nov-1927 Today's Date: 11/29/2019   History of Present Illness  Stephanie Donovan is a 86 y.o. female with medical history significant of hypertension, history of paroxysmal A. fib, recurrent syncope, TIA, presented with syncope episode. Apparently episode occurred at SNF in her w/c and she slipped out to floor. CT neg.   Clinical Impression  Pt admitted with above diagnosis. Pt presents with generalized weakness and dizziness ,especially with changes in position. Pt required mod A for bed mobility. She stood with use of stedy and min A and developed orthostasis. Pt could not tolerate standing for 3 mins for a second BP to be taken. In addition to dizziness she relays a full body fatigue and aching that is new. Pt's daughter concerned because she reports that if pt does not make it to the dining room at her facility, she misses her meal (no food is brought to her). She feels that she is not eating or drinking consistently.  Pt currently with functional limitations due to the deficits listed below (see PT Problem List). Pt will benefit from skilled PT to increase their independence and safety with mobility to allow discharge to the venue listed below.   BP supine 121/65      Sitting 131/69      Standing 110/75  Pt dizzy    Follow Up Recommendations SNF    Equipment Recommendations  None recommended by PT    Recommendations for Other Services       Precautions / Restrictions Precautions Precautions: Fall Precaution Comments: pt has had numerous falls resulting in fractures Restrictions Weight Bearing Restrictions: No      Mobility  Bed Mobility Overal bed mobility: Needs Assistance Bed Mobility: Supine to Sit;Sit to Supine     Supine to sit: Mod assist Sit to supine: Mod assist   General bed mobility comments: able to sit straight up in bed holding to sheet but needed mod A to move RLE  off EOB. Pt able to scoot self to EOB. Mod A to BLE's for return to supine.     Transfers Overall transfer level: Needs assistance Equipment used: Ambulation equipment used Transfers: Sit to/from Stand Sit to Stand: Min assist         General transfer comment: used stedy since pt was dizzy and came in with syncopal episode. Pt's dizziness increased in standing. Was able to stand for BP but not for BP at 3 mins.   Ambulation/Gait             General Gait Details: nonambulatory at baseline  Stairs            Wheelchair Mobility    Modified Rankin (Stroke Patients Only)       Balance Overall balance assessment: Needs assistance;History of Falls Sitting-balance support: Feet supported;Bilateral upper extremity supported Sitting balance-Leahy Scale: Poor Sitting balance - Comments: can maintain static sitting but loses balance with challenge Postural control: Posterior lean Standing balance support: Bilateral upper extremity supported;During functional activity Standing balance-Leahy Scale: Poor Standing balance comment: heavily reliant on UE support                             Pertinent Vitals/Pain Pain Assessment: Faces Faces Pain Scale: Hurts little more Pain Location: generalized and head Pain Descriptors / Indicators: Headache;Aching Pain Intervention(s): Limited activity within patient's tolerance;Monitored during session    Home Living Family/patient expects  to be discharged to:: Assisted living               Home Equipment: Gilford Rile - 4 wheels;Cane - single point;Wheelchair - manual      Prior Function Level of Independence: Needs assistance   Gait / Transfers Assistance Needed: does not ambulate. Was able to transfer to w/c independently if it was right beside bed  ADL's / Homemaking Assistance Needed: assist needed        Hand Dominance   Dominant Hand: Right    Extremity/Trunk Assessment   Upper Extremity  Assessment Upper Extremity Assessment: Generalized weakness    Lower Extremity Assessment Lower Extremity Assessment: Generalized weakness    Cervical / Trunk Assessment Cervical / Trunk Assessment: Kyphotic  Communication   Communication: No difficulties  Cognition Arousal/Alertness: Awake/alert Behavior During Therapy: WFL for tasks assessed/performed Overall Cognitive Status: Within Functional Limits for tasks assessed                                 General Comments: amnesic to event, but otherwise appropriate for 75 yo      General Comments General comments (skin integrity, edema, etc.): daughter present throughout session    Exercises     Assessment/Plan    PT Assessment Patient needs continued PT services  PT Problem List Decreased strength;Decreased activity tolerance;Decreased range of motion;Decreased balance;Decreased mobility;Decreased knowledge of precautions;Pain;Cardiopulmonary status limiting activity       PT Treatment Interventions DME instruction;Gait training;Functional mobility training;Therapeutic activities;Therapeutic exercise;Balance training;Patient/family education    PT Goals (Current goals can be found in the Care Plan section)  Acute Rehab PT Goals Patient Stated Goal: figure out what's going on with her PT Goal Formulation: With patient Time For Goal Achievement: 12/13/19 Potential to Achieve Goals: Good    Frequency Min 2X/week   Barriers to discharge        Co-evaluation               AM-PAC PT "6 Clicks" Mobility  Outcome Measure Help needed turning from your back to your side while in a flat bed without using bedrails?: A Little Help needed moving from lying on your back to sitting on the side of a flat bed without using bedrails?: A Little Help needed moving to and from a bed to a chair (including a wheelchair)?: A Lot Help needed standing up from a chair using your arms (e.g., wheelchair or bedside  chair)?: A Lot Help needed to walk in hospital room?: Total Help needed climbing 3-5 steps with a railing? : Total 6 Click Score: 12    End of Session   Activity Tolerance: Treatment limited secondary to medical complications (Comment) (limited by dizziness) Patient left: in bed;with call bell/phone within reach;with family/visitor present Nurse Communication: Mobility status PT Visit Diagnosis: Unsteadiness on feet (R26.81);Repeated falls (R29.6);Muscle weakness (generalized) (M62.81);Dizziness and giddiness (R42)    Time: 1107-1130 PT Time Calculation (min) (ACUTE ONLY): 23 min   Charges:   PT Evaluation $PT Eval Moderate Complexity: 1 Mod PT Treatments $Therapeutic Activity: 8-22 mins        Leighton Roach, [place]  Pager [phone] Office [place] 11/29/2019, 1:51 PM

## 2019-11-29 NOTE — Progress Notes (Signed)
Orthostatic vital signs could not be obtain from patient as patient could not follow instructions and had poor attention.Will continue to monitor.   Glenola Wheat,RN.

## 2019-11-29 NOTE — Progress Notes (Signed)
PROGRESS NOTE    Stephanie Donovan  UKG:[id number] DOB: 10/08/1927 DOA: 11/28/2019 PCP: Katherina Mires, MD   Brief Narrative: 86 year old with past medical history significant for hypertension, paroxysmal A. fib, recurrent syncope, TIA who presents after syncopal episode.  Patient was sitting in her wheelchair when she slipped down to the floor.  No further history provided  Patient CT head negative for acute intracranial abnormality, troponins negative.  Orthostatics vital positive for right heart rate.   Assessment & Plan:   Principal Problem:   Syncope Active Problems:   Hypothyroidism   Paroxysmal A-fib (HCC)   Chronic diastolic CHF (congestive heart failure) (HCC)   Chronic anemia  1-Syncope: Continue to monitor on telemetry for arrhythmia. Troponin  negative. Echo pending. Orthostatic vitals: 121/65, blood pressure standing 110/75.  Heart rate increased from 90 lying down -124 on his standing Plan  to hydrate overnight.  2-Anemia: Prior history of iron deficiency anemia.  Check iron levels.  B12 low normal.  Start B12 supplements.  3-Magnesium  Low normal range.  Start oral supplement 4-Hypothyroidism: Continue with Synthroid 5-Paroxysmal A. fib: Continue with metoprolol.  Not on anticoagulation 6-lower extremity edema: Dopplers negative.  TED hose ordered. 7-chronic diastolic heart failure: Hold Lasix.  Resumed probably tomorrow  Estimated body mass index is 20.59 kg/m as calculated from the following:   Height as of this encounter: 5\' 4"  (1.626 m).   Weight as of this encounter: 54.4 kg.   DVT prophylaxis: Lovenox Code Status: Full code Family Communication: Care discussed with patient and daughter who was at bedside Disposition Plan:  Status is: Observation  The patient remains OBS appropriate and will d/c before 2 midnights.  Dispo: The patient is from: SNF              Anticipated d/c is to: SNF              Anticipated d/c date is: 1 day               Patient currently is not medically stable to d/c.  Plan to give IV fluids overnight and repeat orthostatic vitals on 11/8        Consultants:   None  Procedures:   Dopplers negative for DVT  Echo pending  Antimicrobials:    Subjective: Patient is pleasantly confused, she does not know how she fell or what happened yesterday.  She denies chest pain, shortness of breath.  Objective: Vitals:   11/28/19 2237 11/29/19 0129 11/29/19 0500 11/29/19 0634  BP: 130/76 (!) 115/58  129/66  Pulse: 80 88  80  Resp:  17  18  Temp:  97.7 F (36.5 C)  97.6 F (36.4 C)  TempSrc:  Oral    SpO2:  93%  97%  Weight:   54.4 kg   Height:        Intake/Output Summary (Last 24 hours) at 11/29/2019 1315 Last data filed at 11/29/2019 0600 Gross per 24 hour  Intake 0 ml  Output 550 ml  Net -550 ml   Filed Weights   11/28/19 0930 11/29/19 0500  Weight: 54.4 kg 54.4 kg    Examination:  General exam: Appears calm and comfortable  Respiratory system: Clear to auscultation. Respiratory effort normal. Cardiovascular system: S1 & S2 heard, RRR. No JVD, murmurs, rubs, gallops or clicks. No pedal edema. Gastrointestinal system: Abdomen is nondistended, soft and nontender. No organomegaly or masses felt. Normal bowel sounds heard. Central nervous system: Alert and oriented.  Extremities: plus 1  edema  Data Reviewed: I have personally reviewed following labs and imaging studies  CBC: Recent Labs  Lab 11/28/19 1033  WBC 8.4  NEUTROABS 5.8  HGB 11.7*  HCT 37.6  MCV 86.0  PLT 161   Basic Metabolic Panel: Recent Labs  Lab 11/28/19 1033 11/28/19 1839  NA 135  --   K 3.6  --   CL 98  --   CO2 25  --   GLUCOSE 153*  --   BUN 7*  --   CREATININE 0.70  --   CALCIUM 9.0  --   MG  --  1.8  PHOS  --  3.5   GFR: Estimated Creatinine Clearance: 40.1 mL/min (by C-G formula based on SCr of 0.7 mg/dL). Liver Function Tests: Recent Labs  Lab 11/28/19 1033  AST 14*  ALT 10    ALKPHOS 98  BILITOT 0.7  PROT 6.8  ALBUMIN 2.9*   No results for input(s): LIPASE, AMYLASE in the last 168 hours. No results for input(s): AMMONIA in the last 168 hours. Coagulation Profile: No results for input(s): INR, PROTIME in the last 168 hours. Cardiac Enzymes: No results for input(s): CKTOTAL, CKMB, CKMBINDEX, TROPONINI in the last 168 hours. BNP (last 3 results) No results for input(s): PROBNP in the last 8760 hours. HbA1C: No results for input(s): HGBA1C in the last 72 hours. CBG: Recent Labs  Lab 11/28/19 0930 11/29/19 0636  GLUCAP 148* 87   Lipid Profile: No results for input(s): CHOL, HDL, LDLCALC, TRIG, CHOLHDL, LDLDIRECT in the last 72 hours. Thyroid Function Tests: Recent Labs    11/28/19 1839  TSH 1.457   Anemia Panel: Recent Labs    11/28/19 1839  VITAMINB12 258   Sepsis Labs: No results for input(s): PROCALCITON, LATICACIDVEN in the last 168 hours.  Recent Results (from the past 240 hour(s))  Respiratory Panel by RT PCR (Flu A&B, Covid) - Nasopharyngeal Swab     Status: None   Collection Time: 11/28/19  2:38 PM   Specimen: Nasopharyngeal Swab  Result Value Ref Range Status   SARS Coronavirus 2 by RT PCR NEGATIVE NEGATIVE Final    Comment: (NOTE) SARS-CoV-2 target nucleic acids are NOT DETECTED.  The SARS-CoV-2 RNA is generally detectable in upper respiratoy specimens during the acute phase of infection. The lowest concentration of SARS-CoV-2 viral copies this assay can detect is 131 copies/mL. A negative result does not preclude SARS-Cov-2 infection and should not be used as the sole basis for treatment or other patient management decisions. A negative result may occur with  improper specimen collection/handling, submission of specimen other than nasopharyngeal swab, presence of viral mutation(s) within the areas targeted by this assay, and inadequate number of viral copies (<131 copies/mL). A negative result must be combined with  clinical observations, patient history, and epidemiological information. The expected result is Negative.  Fact Sheet for Patients:  PinkCheek.be  Fact Sheet for Healthcare Providers:  GravelBags.it  This test is no t yet approved or cleared by the [place] FDA and  has been authorized for detection and/or diagnosis of SARS-CoV-2 by FDA under an Emergency Use Authorization (EUA). This EUA will remain  in effect (meaning this test can be used) for the duration of the COVID-19 declaration under Section 564(b)(1) of the Act, 21 U.S.C. section 360bbb-3(b)(1), unless the authorization is terminated or revoked sooner.     Influenza A by PCR NEGATIVE NEGATIVE Final   Influenza B by PCR NEGATIVE NEGATIVE Final    Comment: (NOTE) The  Xpert Xpress SARS-CoV-2/FLU/RSV assay is intended as an aid in  the diagnosis of influenza from Nasopharyngeal swab specimens and  should not be used as a sole basis for treatment. Nasal washings and  aspirates are unacceptable for Xpert Xpress SARS-CoV-2/FLU/RSV  testing.  Fact Sheet for Patients: PinkCheek.be  Fact Sheet for Healthcare Providers: GravelBags.it  This test is not yet approved or cleared by the [place] FDA and  has been authorized for detection and/or diagnosis of SARS-CoV-2 by  FDA under an Emergency Use Authorization (EUA). This EUA will remain  in effect (meaning this test can be used) for the duration of the  Covid-19 declaration under Section 564(b)(1) of the Act, 21  U.S.C. section 360bbb-3(b)(1), unless the authorization is  terminated or revoked. Performed at [facility], [place] [address], [place], [place] [address]          Radiology Studies: CT HEAD WO CONTRAST  Result Date: 11/28/2019 CLINICAL DATA:  86 year old female with history of recurrent syncope today. EXAM: CT HEAD WITHOUT CONTRAST  TECHNIQUE: Contiguous axial images were obtained from the base of the skull through the vertex without intravenous contrast. COMPARISON:  Head CT 02/02/2019. FINDINGS: Brain: Moderate cerebral and mild cerebellar atrophy. Patchy and confluent areas of decreased attenuation are noted throughout the deep and periventricular white matter of the cerebral hemispheres bilaterally, compatible with chronic microvascular ischemic disease. No evidence of acute infarction, hemorrhage, hydrocephalus, extra-axial collection or mass lesion/mass effect. Vascular: No hyperdense vessel or unexpected calcification. Skull: Normal. Negative for fracture or focal lesion. Sinuses/Orbits: No acute finding. Other: None. IMPRESSION: 1. No acute intracranial abnormalities. 2. Moderate cerebral and mild cerebellar atrophy with severe chronic microvascular ischemic changes in the cerebral white matter, as above. Electronically Signed   By: Vinnie Langton M.D.   On: 11/28/2019 17:33   DG Chest Port 1 View  Result Date: 11/28/2019 CLINICAL DATA:  Syncope. EXAM: PORTABLE CHEST 1 VIEW COMPARISON:  02/02/2019 FINDINGS: Normal heart size. Aortic atherosclerotic calcifications. No pleural effusion or edema. No airspace densities identified. The bones appear diffusely osteopenic. Similar appearance of compression deformities within the thoracic spine. Previous right shoulder arthroplasty. IMPRESSION: No acute cardiopulmonary abnormalities. Electronically Signed   By: Kerby Moors M.D.   On: 11/28/2019 11:15   ECHOCARDIOGRAM COMPLETE  Result Date: 11/29/2019    ECHOCARDIOGRAM REPORT   Patient Name:   JENNALYN CAWLEY Date of Exam: 11/29/2019 Medical Rec #:  [id number]           Height:       64.0 in Accession #:    [id number]          Weight:       119.9 lb Date of Birth:  [birth year]           BSA:          1.574 m Patient Age:    27 years            BP:           129/66 mmHg Patient Gender: F                   HR:           80 bpm. Exam  Location:  Inpatient Procedure: 2D Echo, Cardiac Doppler and Color Doppler Indications:    Syncope 780.2/ R55  History:        Patient has prior history of Echocardiogram examinations, most  recent 08/26/2014. TIA, Signs/Symptoms:Syncope; Risk                 Factors:Non-Smoker.  Sonographer:    Vickie Epley RDCS Referring Phys: [id number] Lequita Halt  Sonographer Comments: Image acquisition challenging due to patient body habitus and Image acquisition challenging due to respiratory motion. Unable to administer definity due to no IV access. IMPRESSIONS  1. Left ventricular ejection fraction, by estimation, is 55 to 60%. The left ventricle has normal function. Left ventricular endocardial border not optimally defined to evaluate regional wall motion. Left ventricular diastolic parameters are consistent with Grade I diastolic dysfunction (impaired relaxation).  2. Right ventricular systolic function is normal. The right ventricular size is normal.  3. A small pericardial effusion is present. The pericardial effusion is circumferential.  4. The mitral valve was not well visualized. No evidence of mitral valve regurgitation. No evidence of mitral stenosis.  5. The aortic valve was not well visualized. Aortic valve regurgitation is not visualized. No aortic stenosis is present.  6. The inferior vena cava is normal in size with greater than 50% respiratory variability, suggesting right atrial pressure of 3 mmHg. FINDINGS  Left Ventricle: Left ventricular ejection fraction, by estimation, is 55 to 60%. The left ventricle has normal function. Left ventricular endocardial border not optimally defined to evaluate regional wall motion. The left ventricular internal cavity size was normal in size. There is no left ventricular hypertrophy. Left ventricular diastolic parameters are consistent with Grade I diastolic dysfunction (impaired relaxation). Normal left ventricular filling pressure. Right Ventricle: The right  ventricular size is normal. No increase in right ventricular wall thickness. Right ventricular systolic function is normal. Left Atrium: Left atrial size was normal in size. Right Atrium: Right atrial size was not well visualized. Pericardium: A small pericardial effusion is present. The pericardial effusion is circumferential. Mitral Valve: The mitral valve was not well visualized. No evidence of mitral valve regurgitation. No evidence of mitral valve stenosis. Tricuspid Valve: The tricuspid valve is normal in structure. Tricuspid valve regurgitation is not demonstrated. No evidence of tricuspid stenosis. Aortic Valve: The aortic valve was not well visualized. Aortic valve regurgitation is not visualized. No aortic stenosis is present. Aortic valve mean gradient measures 2.2 mmHg. Aortic valve peak gradient measures 4.3 mmHg. Aortic valve area, by VTI measures 2.08 cm. Pulmonic Valve: The pulmonic valve was not well visualized. Pulmonic valve regurgitation is not visualized. No evidence of pulmonic stenosis. Aorta: The aortic root is normal in size and structure. Pulmonary Artery: Indeterminant PASP, inadequate TR jet. Venous: The inferior vena cava is normal in size with greater than 50% respiratory variability, suggesting right atrial pressure of 3 mmHg. IAS/Shunts: The interatrial septum was not well visualized.  LEFT VENTRICLE PLAX 2D LVIDd:         4.70 cm     Diastology LVIDs:         3.90 cm     LV e' medial:    6.64 cm/s LV PW:         0.70 cm     LV E/e' medial:  9.4 LV IVS:        0.70 cm     LV e' lateral:   5.77 cm/s LVOT diam:     2.00 cm     LV E/e' lateral: 10.8 LV SV:         39 LV SV Index:   25 LVOT Area:     3.14 cm  LV Volumes (MOD) LV vol  d, MOD A4C: 73.5 ml LV vol s, MOD A4C: 35.8 ml LV SV MOD A4C:     73.5 ml RIGHT VENTRICLE RV S prime:     15.40 cm/s TAPSE (M-mode): 1.7 cm LEFT ATRIUM           Index       RIGHT ATRIUM          Index LA diam:      3.00 cm 1.91 cm/m  RA Area:     7.18 cm  LA Vol (A2C): 17.4 ml 11.05 ml/m RA Volume:   9.85 ml  6.26 ml/m LA Vol (A4C): 31.0 ml 19.69 ml/m  AORTIC VALVE AV Area (Vmax):    1.99 cm AV Area (Vmean):   2.07 cm AV Area (VTI):     2.08 cm AV Vmax:           103.81 cm/s AV Vmean:          69.099 cm/s AV VTI:            0.186 m AV Peak Grad:      4.3 mmHg AV Mean Grad:      2.2 mmHg LVOT Vmax:         65.90 cm/s LVOT Vmean:        45.600 cm/s LVOT VTI:          0.123 m LVOT/AV VTI ratio: 0.66  AORTA Ao Root diam: 3.00 cm MITRAL VALVE MV Area (PHT): 3.83 cm    SHUNTS MV Decel Time: 198 msec    Systemic VTI:  0.12 m MV E velocity: 62.10 cm/s  Systemic Diam: 2.00 cm MV A velocity: 82.30 cm/s MV E/A ratio:  0.75 Carlyle Dolly MD Electronically signed by Carlyle Dolly MD Signature Date/Time: 11/29/2019/12:21:57 PM    Final    VAS [place] LOWER EXTREMITY VENOUS (DVT)  Result Date: 11/29/2019  Lower Venous DVT Study Indications: Edema, and syncope.  Limitations: Body habitus, poor ultrasound/tissue interface and edema, patient unable to bend or turn right knee. Comparison Study: Prior study from 12/23/2011 is available for comparison Performing Technologist: Sharion Dove RVS  Examination Guidelines: A complete evaluation includes B-mode imaging, spectral Doppler, color Doppler, and power Doppler as needed of all accessible portions of each vessel. Bilateral testing is considered an integral part of a complete examination. Limited examinations for reoccurring indications may be performed as noted. The reflux portion of the exam is performed with the patient in reverse Trendelenburg.  +---------+---------------+---------+-----------+----------+-------------------+ RIGHT    CompressibilityPhasicitySpontaneityPropertiesThrombus Aging      +---------+---------------+---------+-----------+----------+-------------------+ CFV      Full                                         pulsatile            +---------+---------------+---------+-----------+----------+-------------------+ SFJ      Full                                                             +---------+---------------+---------+-----------+----------+-------------------+ FV Prox  Full                                                             +---------+---------------+---------+-----------+----------+-------------------+  FV Mid                  Yes      Yes                  patent by color and                                                       Doppler             +---------+---------------+---------+-----------+----------+-------------------+ FV Distal               Yes      Yes                  patent by color and                                                       Doppler             +---------+---------------+---------+-----------+----------+-------------------+ PFV      Full                                                             +---------+---------------+---------+-----------+----------+-------------------+ POP                     Yes      Yes                  patent by color and                                                       Doppler             +---------+---------------+---------+-----------+----------+-------------------+ PTV                                                   patent by color     +---------+---------------+---------+-----------+----------+-------------------+ PERO                                                  Not well visualized +---------+---------------+---------+-----------+----------+-------------------+   +---------+---------------+---------+-----------+----------+-------------------+ LEFT     CompressibilityPhasicitySpontaneityPropertiesThrombus Aging      +---------+---------------+---------+-----------+----------+-------------------+ CFV      Full                                         pulsatile            +---------+---------------+---------+-----------+----------+-------------------+  SFJ      Full                                                             +---------+---------------+---------+-----------+----------+-------------------+ FV Prox  Full                                                             +---------+---------------+---------+-----------+----------+-------------------+ FV Mid                  Yes      Yes                  patent by color and                                                       Doppler             +---------+---------------+---------+-----------+----------+-------------------+ FV Distal               Yes      Yes                  patent by color and                                                       Doppler             +---------+---------------+---------+-----------+----------+-------------------+ PFV      Full                                                             +---------+---------------+---------+-----------+----------+-------------------+ POP      Full           Yes      Yes                                      +---------+---------------+---------+-----------+----------+-------------------+ PTV      Full                                                             +---------+---------------+---------+-----------+----------+-------------------+ PERO  Not well visualized +---------+---------------+---------+-----------+----------+-------------------+    Summary: RIGHT: - There is no evidence of deep vein thrombosis in the lower extremity. However, portions of this examination were limited- see technologist comments above.  LEFT: - Findings appear essentially unchanged compared to previous examination. - There is no evidence of deep vein thrombosis in the lower extremity. However, portions of this examination were limited- see technologist comments  above.  *See table(s) above for measurements and observations.    Preliminary         Scheduled Meds: . aspirin EC  81 mg Oral Daily  . brimonidine  1 drop Left Eye BID  . brinzolamide  1 drop Both Eyes BID  . docusate sodium  100 mg Oral Daily  . enoxaparin (LOVENOX) injection  40 mg Subcutaneous Q24H  . latanoprost  1 drop Left Eye QHS  . levothyroxine  50 mcg Oral Daily  . magnesium oxide  400 mg Oral BID  . metoprolol succinate  25 mg Oral Daily  . sodium chloride flush  3 mL Intravenous Q12H  . timolol  1 drop Both Eyes BID  . vitamin B-12  100 mcg Oral Daily   Continuous Infusions: . sodium chloride       LOS: 0 days    Time spent: 35 minutes    Macio Kissoon A Shanon Seawright, MD [facility]   If 7PM-7AM, please contact night-coverage [email or web address]  11/29/2019, 1:15 PM

## 2019-11-29 NOTE — Plan of Care (Signed)
  Problem: Safety: Goal: Ability to remain free from injury will improve Outcome: Progressing   

## 2019-11-30 DIAGNOSIS — R55 Syncope and collapse: Secondary | ICD-10-CM | POA: Diagnosis not present

## 2019-11-30 LAB — IRON AND TIBC
Iron: 47 ug/dL (ref 28–170)
Saturation Ratios: 23 % (ref 10.4–31.8)
TIBC: 202 ug/dL — ABNORMAL LOW (ref 250–450)
UIBC: 155 ug/dL

## 2019-11-30 LAB — SARS CORONAVIRUS 2 BY RT PCR (HOSPITAL ORDER, PERFORMED IN ~~LOC~~ HOSPITAL LAB): SARS Coronavirus 2: NEGATIVE

## 2019-11-30 LAB — FERRITIN: Ferritin: 118 ng/mL (ref 11–307)

## 2019-11-30 LAB — GLUCOSE, CAPILLARY: Glucose-Capillary: 89 mg/dL (ref 70–99)

## 2019-11-30 MED ORDER — FUROSEMIDE 20 MG PO TABS: 20.0000 mg | ORAL_TABLET | Freq: Every day | ORAL | 0 refills | Status: DC | PRN

## 2019-11-30 MED ORDER — SODIUM CHLORIDE 0.9 % IV SOLN: INTRAVENOUS | Status: DC

## 2019-11-30 MED ORDER — MAGNESIUM OXIDE 400 (241.3 MG) MG PO TABS: 400.0000 mg | ORAL_TABLET | Freq: Two times a day (BID) | ORAL | 0 refills | Status: AC

## 2019-11-30 MED ORDER — CYANOCOBALAMIN 100 MCG PO TABS
100.0000 ug | ORAL_TABLET | Freq: Every day | ORAL | 0 refills | Status: DC
Start: 2019-11-30 — End: 2020-08-04

## 2019-11-30 NOTE — Discharge Summary (Addendum)
Physician Discharge Summary  Stephanie Donovan VEL:[id number] DOB: 01-23-1927 DOA: 11/28/2019  PCP: Katherina Mires, MD  Admit date: 11/28/2019 Discharge date: 11/30/2019  Admitted From: SNF Disposition: [place].   Recommendations for Outpatient Follow-up:  1. Follow up with PCP in 1-2 weeks 2. Please obtain BMP/CBC in one week 3. Needs follow with cardiology for  Holter Monitor.  4. Please get PT, OT. And to help with assistance.   Home Health: yes.  Equipment/Devices: None  Discharge Condition: Stable.  CODE STATUS: Full code Diet recommendation: Heart Healthy   Brief/Interim Summary: 86 year old with past medical history significant for hypertension, paroxysmal A. fib, recurrent syncope, TIA who presents after syncopal episode.  Patient was sitting in her wheelchair when she slipped down to the floor.  No further history provided  Patient CT head negative for acute intracranial abnormality, troponins negative.  Orthostatics vital positive for right heart rate.  1-Syncope: probably secondary to orthostatic hypotension.  Patient was monitor on telemetry for arrhythmia. Troponin  negative. Echo Normal EF. Troponin negative.  Orthostatic vitals: 121/65, blood pressure standing 110/75.  Heart rate increased from 90 lying down -124 on his standing Orthostatic 11/08: 143/72----138/83  Needs referral to cardiology for Holter monitor.   2-Anemia: Prior history of iron deficiency anemia.  Check iron levels.  B12 low normal.  Started  B12 supplements. Iron normal.   3-Magnesium  Low normal range.  Started  oral supplement 4-Hypothyroidism: Continue with Synthroid 5-Paroxysmal A. fib: Continue with metoprolol.  Not on anticoagulation 6-lower extremity edema: Dopplers negative.  TED hose ordered. 7-chronic diastolic heart failure: Resume lasix PRN.    Discharge Diagnoses:  Principal Problem:   Syncope Active Problems:   Hypothyroidism   Paroxysmal A-fib (HCC)   Chronic  diastolic CHF (congestive heart failure) (HCC)   Chronic anemia    Discharge Instructions  Discharge Instructions    Diet - low sodium heart healthy   Complete by: As directed    Increase activity slowly   Complete by: As directed      Allergies as of 11/30/2019      Reactions   Aspirin Palpitations, Other (See Comments)   Unspecified Mixed reactions   Penicillins Other (See Comments)   Lost hearing temporarily DID THE REACTION INVOLVE: Swelling of the face/tongue/throat, SOB, or low BP? No Sudden or severe rash/hives, skin peeling, or the inside of the mouth or nose? No Did it require medical treatment? No When did it last happen? If all above answers are "NO", may proceed with cephalosporin use.   Codeine Palpitations      Medication List    STOP taking these medications   potassium chloride 10 MEQ tablet Commonly known as: KLOR-CON     TAKE these medications   aspirin EC 81 MG tablet Take 81 mg by mouth daily.   Azopt 1 % ophthalmic suspension Generic drug: brinzolamide Place 1 drop into both eyes 2 (two) times daily.   brimonidine 0.2 % ophthalmic solution Commonly known as: ALPHAGAN Place 1 drop into the left eye 2 (two) times daily.   cyanocobalamin 100 MCG tablet Take 1 tablet (100 mcg total) by mouth daily.   docusate sodium 100 MG capsule Commonly known as: COLACE Take 100 mg by mouth daily.   furosemide 20 MG tablet Commonly known as: LASIX Take 1 tablet (20 mg total) by mouth daily as needed for edema. What changed:   when to take this  reasons to take this   latanoprost 0.005 % ophthalmic solution Commonly  known as: XALATAN Place 1 drop into the left eye at bedtime.   levothyroxine 75 MCG tablet Commonly known as: SYNTHROID Take 50 mcg by mouth daily.   magnesium oxide 400 (241.3 Mg) MG tablet Commonly known as: MAG-OX Take 1 tablet (400 mg total) by mouth 2 (two) times daily.   metoprolol succinate 25 MG 24 hr  tablet Commonly known as: TOPROL-XL Take 25 mg by mouth daily.   Timoptic Ocudose 0.25 % Soln Generic drug: Timolol Maleate PF Place 1 drop into both eyes 2 (two) times daily.       Allergies  Allergen Reactions  . Aspirin Palpitations and Other (See Comments)    Unspecified Mixed reactions  . Penicillins Other (See Comments)    Lost hearing temporarily DID THE REACTION INVOLVE: Swelling of the face/tongue/throat, SOB, or low BP? No Sudden or severe rash/hives, skin peeling, or the inside of the mouth or nose? No Did it require medical treatment? No When did it last happen? If all above answers are "NO", may proceed with cephalosporin use.   . Codeine Palpitations    Consultations:  none   Procedures/Studies: CT HEAD WO CONTRAST  Result Date: 11/28/2019 CLINICAL DATA:  86 year old female with history of recurrent syncope today. EXAM: CT HEAD WITHOUT CONTRAST TECHNIQUE: Contiguous axial images were obtained from the base of the skull through the vertex without intravenous contrast. COMPARISON:  Head CT 02/02/2019. FINDINGS: Brain: Moderate cerebral and mild cerebellar atrophy. Patchy and confluent areas of decreased attenuation are noted throughout the deep and periventricular white matter of the cerebral hemispheres bilaterally, compatible with chronic microvascular ischemic disease. No evidence of acute infarction, hemorrhage, hydrocephalus, extra-axial collection or mass lesion/mass effect. Vascular: No hyperdense vessel or unexpected calcification. Skull: Normal. Negative for fracture or focal lesion. Sinuses/Orbits: No acute finding. Other: None. IMPRESSION: 1. No acute intracranial abnormalities. 2. Moderate cerebral and mild cerebellar atrophy with severe chronic microvascular ischemic changes in the cerebral white matter, as above. Electronically Signed   By: Vinnie Langton M.D.   On: 11/28/2019 17:33   DG Chest Port 1 View  Result Date: 11/28/2019 CLINICAL DATA:   Syncope. EXAM: PORTABLE CHEST 1 VIEW COMPARISON:  02/02/2019 FINDINGS: Normal heart size. Aortic atherosclerotic calcifications. No pleural effusion or edema. No airspace densities identified. The bones appear diffusely osteopenic. Similar appearance of compression deformities within the thoracic spine. Previous right shoulder arthroplasty. IMPRESSION: No acute cardiopulmonary abnormalities. Electronically Signed   By: Kerby Moors M.D.   On: 11/28/2019 11:15   ECHOCARDIOGRAM COMPLETE  Result Date: 11/29/2019    ECHOCARDIOGRAM REPORT   Patient Name:   CHIEKO NEISES Date of Exam: 11/29/2019 Medical Rec #:  [id number]           Height:       64.0 in Accession #:    [id number]          Weight:       119.9 lb Date of Birth:  05/21/1927           BSA:          1.574 m Patient Age:    31 years            BP:           129/66 mmHg Patient Gender: F                   HR:           80 bpm. Exam Location:  Inpatient Procedure: 2D  Echo, Cardiac Doppler and Color Doppler Indications:    Syncope 780.2/ R55  History:        Patient has prior history of Echocardiogram examinations, most                 recent 08/26/2014. TIA, Signs/Symptoms:Syncope; Risk                 Factors:Non-Smoker.  Sonographer:    Vickie Epley RDCS Referring Phys: [id number] Lequita Halt  Sonographer Comments: Image acquisition challenging due to patient body habitus and Image acquisition challenging due to respiratory motion. Unable to administer definity due to no IV access. IMPRESSIONS  1. Left ventricular ejection fraction, by estimation, is 55 to 60%. The left ventricle has normal function. Left ventricular endocardial border not optimally defined to evaluate regional wall motion. Left ventricular diastolic parameters are consistent with Grade I diastolic dysfunction (impaired relaxation).  2. Right ventricular systolic function is normal. The right ventricular size is normal.  3. A small pericardial effusion is present. The pericardial effusion  is circumferential.  4. The mitral valve was not well visualized. No evidence of mitral valve regurgitation. No evidence of mitral stenosis.  5. The aortic valve was not well visualized. Aortic valve regurgitation is not visualized. No aortic stenosis is present.  6. The inferior vena cava is normal in size with greater than 50% respiratory variability, suggesting right atrial pressure of 3 mmHg. FINDINGS  Left Ventricle: Left ventricular ejection fraction, by estimation, is 55 to 60%. The left ventricle has normal function. Left ventricular endocardial border not optimally defined to evaluate regional wall motion. The left ventricular internal cavity size was normal in size. There is no left ventricular hypertrophy. Left ventricular diastolic parameters are consistent with Grade I diastolic dysfunction (impaired relaxation). Normal left ventricular filling pressure. Right Ventricle: The right ventricular size is normal. No increase in right ventricular wall thickness. Right ventricular systolic function is normal. Left Atrium: Left atrial size was normal in size. Right Atrium: Right atrial size was not well visualized. Pericardium: A small pericardial effusion is present. The pericardial effusion is circumferential. Mitral Valve: The mitral valve was not well visualized. No evidence of mitral valve regurgitation. No evidence of mitral valve stenosis. Tricuspid Valve: The tricuspid valve is normal in structure. Tricuspid valve regurgitation is not demonstrated. No evidence of tricuspid stenosis. Aortic Valve: The aortic valve was not well visualized. Aortic valve regurgitation is not visualized. No aortic stenosis is present. Aortic valve mean gradient measures 2.2 mmHg. Aortic valve peak gradient measures 4.3 mmHg. Aortic valve area, by VTI measures 2.08 cm. Pulmonic Valve: The pulmonic valve was not well visualized. Pulmonic valve regurgitation is not visualized. No evidence of pulmonic stenosis. Aorta: The  aortic root is normal in size and structure. Pulmonary Artery: Indeterminant PASP, inadequate TR jet. Venous: The inferior vena cava is normal in size with greater than 50% respiratory variability, suggesting right atrial pressure of 3 mmHg. IAS/Shunts: The interatrial septum was not well visualized.  LEFT VENTRICLE PLAX 2D LVIDd:         4.70 cm     Diastology LVIDs:         3.90 cm     LV e' medial:    6.64 cm/s LV PW:         0.70 cm     LV E/e' medial:  9.4 LV IVS:        0.70 cm     LV e' lateral:   5.77 cm/s  LVOT diam:     2.00 cm     LV E/e' lateral: 10.8 LV SV:         39 LV SV Index:   25 LVOT Area:     3.14 cm  LV Volumes (MOD) LV vol d, MOD A4C: 73.5 ml LV vol s, MOD A4C: 35.8 ml LV SV MOD A4C:     73.5 ml RIGHT VENTRICLE RV S prime:     15.40 cm/s TAPSE (M-mode): 1.7 cm LEFT ATRIUM           Index       RIGHT ATRIUM          Index LA diam:      3.00 cm 1.91 cm/m  RA Area:     7.18 cm LA Vol (A2C): 17.4 ml 11.05 ml/m RA Volume:   9.85 ml  6.26 ml/m LA Vol (A4C): 31.0 ml 19.69 ml/m  AORTIC VALVE AV Area (Vmax):    1.99 cm AV Area (Vmean):   2.07 cm AV Area (VTI):     2.08 cm AV Vmax:           103.81 cm/s AV Vmean:          69.099 cm/s AV VTI:            0.186 m AV Peak Grad:      4.3 mmHg AV Mean Grad:      2.2 mmHg LVOT Vmax:         65.90 cm/s LVOT Vmean:        45.600 cm/s LVOT VTI:          0.123 m LVOT/AV VTI ratio: 0.66  AORTA Ao Root diam: 3.00 cm MITRAL VALVE MV Area (PHT): 3.83 cm    SHUNTS MV Decel Time: 198 msec    Systemic VTI:  0.12 m MV E velocity: 62.10 cm/s  Systemic Diam: 2.00 cm MV A velocity: 82.30 cm/s MV E/A ratio:  0.75 Carlyle Dolly MD Electronically signed by Carlyle Dolly MD Signature Date/Time: 11/29/2019/12:21:57 PM    Final    VAS [place] LOWER EXTREMITY VENOUS (DVT)  Result Date: 11/29/2019  Lower Venous DVT Study Indications: Edema, and syncope.  Limitations: Body habitus, poor ultrasound/tissue interface and edema, patient unable to bend or turn right knee.  Comparison Study: Prior study from 12/23/2011 is available for comparison Performing Technologist: Sharion Dove RVS  Examination Guidelines: A complete evaluation includes B-mode imaging, spectral Doppler, color Doppler, and power Doppler as needed of all accessible portions of each vessel. Bilateral testing is considered an integral part of a complete examination. Limited examinations for reoccurring indications may be performed as noted. The reflux portion of the exam is performed with the patient in reverse Trendelenburg.  +---------+---------------+---------+-----------+----------+-------------------+ RIGHT    CompressibilityPhasicitySpontaneityPropertiesThrombus Aging      +---------+---------------+---------+-----------+----------+-------------------+ CFV      Full                                         pulsatile           +---------+---------------+---------+-----------+----------+-------------------+ SFJ      Full                                                             +---------+---------------+---------+-----------+----------+-------------------+  FV Prox  Full                                                             +---------+---------------+---------+-----------+----------+-------------------+ FV Mid                  Yes      Yes                  patent by color and                                                       Doppler             +---------+---------------+---------+-----------+----------+-------------------+ FV Distal               Yes      Yes                  patent by color and                                                       Doppler             +---------+---------------+---------+-----------+----------+-------------------+ PFV      Full                                                             +---------+---------------+---------+-----------+----------+-------------------+ POP                     Yes       Yes                  patent by color and                                                       Doppler             +---------+---------------+---------+-----------+----------+-------------------+ PTV                                                   patent by color     +---------+---------------+---------+-----------+----------+-------------------+ PERO                                                  Not well visualized +---------+---------------+---------+-----------+----------+-------------------+   +---------+---------------+---------+-----------+----------+-------------------+ LEFT     CompressibilityPhasicitySpontaneityPropertiesThrombus  Aging      +---------+---------------+---------+-----------+----------+-------------------+ CFV      Full                                         pulsatile           +---------+---------------+---------+-----------+----------+-------------------+ SFJ      Full                                                             +---------+---------------+---------+-----------+----------+-------------------+ FV Prox  Full                                                             +---------+---------------+---------+-----------+----------+-------------------+ FV Mid                  Yes      Yes                  patent by color and                                                       Doppler             +---------+---------------+---------+-----------+----------+-------------------+ FV Distal               Yes      Yes                  patent by color and                                                       Doppler             +---------+---------------+---------+-----------+----------+-------------------+ PFV      Full                                                             +---------+---------------+---------+-----------+----------+-------------------+ POP      Full           Yes       Yes                                      +---------+---------------+---------+-----------+----------+-------------------+ PTV      Full                                                             +---------+---------------+---------+-----------+----------+-------------------+  PERO                                                  Not well visualized +---------+---------------+---------+-----------+----------+-------------------+     Summary: RIGHT: - There is no evidence of deep vein thrombosis in the lower extremity. However, portions of this examination were limited- see technologist comments above.  LEFT: - Findings appear essentially unchanged compared to previous examination. - There is no evidence of deep vein thrombosis in the lower extremity. However, portions of this examination were limited- see technologist comments above.  *See table(s) above for measurements and observations. Electronically signed by Servando Snare MD on 11/29/2019 at 8:37:20 PM.    Final      Subjective: She is alert, denies chest pain, dyspnea.   Discharge Exam: Vitals:   11/30/19 0903 11/30/19 0943  BP: 138/83 (!) 111/54  Pulse: 92 79  Resp:  18  Temp:  98.3 F (36.8 C)  SpO2: 99% 99%     General: Pt is alert, awake, not in acute distress Cardiovascular: RRR, S1/S2 +, no rubs, no gallops Respiratory: CTA bilaterally, no wheezing, no rhonchi Abdominal: Soft, NT, ND, bowel sounds + Extremities: no edema, no cyanosis    The results of significant diagnostics from this hospitalization (including imaging, microbiology, ancillary and laboratory) are listed below for reference.     Microbiology: Recent Results (from the past 240 hour(s))  Respiratory Panel by RT PCR (Flu A&B, Covid) - Nasopharyngeal Swab     Status: None   Collection Time: 11/28/19  2:38 PM   Specimen: Nasopharyngeal Swab  Result Value Ref Range Status   SARS Coronavirus 2 by RT PCR NEGATIVE NEGATIVE Final    Comment:  (NOTE) SARS-CoV-2 target nucleic acids are NOT DETECTED.  The SARS-CoV-2 RNA is generally detectable in upper respiratoy specimens during the acute phase of infection. The lowest concentration of SARS-CoV-2 viral copies this assay can detect is 131 copies/mL. A negative result does not preclude SARS-Cov-2 infection and should not be used as the sole basis for treatment or other patient management decisions. A negative result may occur with  improper specimen collection/handling, submission of specimen other than nasopharyngeal swab, presence of viral mutation(s) within the areas targeted by this assay, and inadequate number of viral copies (<131 copies/mL). A negative result must be combined with clinical observations, patient history, and epidemiological information. The expected result is Negative.  Fact Sheet for Patients:  PinkCheek.be  Fact Sheet for Healthcare Providers:  GravelBags.it  This test is no t yet approved or cleared by the [place] FDA and  has been authorized for detection and/or diagnosis of SARS-CoV-2 by FDA under an Emergency Use Authorization (EUA). This EUA will remain  in effect (meaning this test can be used) for the duration of the COVID-19 declaration under Section 564(b)(1) of the Act, 21 U.S.C. section 360bbb-3(b)(1), unless the authorization is terminated or revoked sooner.     Influenza A by PCR NEGATIVE NEGATIVE Final   Influenza B by PCR NEGATIVE NEGATIVE Final    Comment: (NOTE) The Xpert Xpress SARS-CoV-2/FLU/RSV assay is intended as an aid in  the diagnosis of influenza from Nasopharyngeal swab specimens and  should not be used as a sole basis for treatment. Nasal washings and  aspirates are unacceptable for Xpert Xpress SARS-CoV-2/FLU/RSV  testing.  Fact Sheet for Patients: PinkCheek.be  Fact  Sheet for Healthcare  Providers: GravelBags.it  This test is not yet approved or cleared by the [place] and  has been authorized for detection and/or diagnosis of SARS-CoV-2 by  FDA under an Emergency Use Authorization (EUA). This EUA will remain  in effect (meaning this test can be used) for the duration of the  Covid-19 declaration under Section 564(b)(1) of the Act, 21  U.S.C. section 360bbb-3(b)(1), unless the authorization is  terminated or revoked. Performed at [facility], [place] [address] [place], [place] [address]      Labs: BNP (last 3 results) No results for input(s): BNP in the last 8760 hours. Basic Metabolic Panel: Recent Labs  Lab 11/28/19 1033 11/28/19 1839  NA 135  --   K 3.6  --   CL 98  --   CO2 25  --   GLUCOSE 153*  --   BUN 7*  --   CREATININE 0.70  --   CALCIUM 9.0  --   MG  --  1.8  PHOS  --  3.5   Liver Function Tests: Recent Labs  Lab 11/28/19 1033  AST 14*  ALT 10  ALKPHOS 98  BILITOT 0.7  PROT 6.8  ALBUMIN 2.9*   No results for input(s): LIPASE, AMYLASE in the last 168 hours. No results for input(s): AMMONIA in the last 168 hours. CBC: Recent Labs  Lab 11/28/19 1033  WBC 8.4  NEUTROABS 5.8  HGB 11.7*  HCT 37.6  MCV 86.0  PLT 352   Cardiac Enzymes: No results for input(s): CKTOTAL, CKMB, CKMBINDEX, TROPONINI in the last 168 hours. BNP: Invalid input(s): POCBNP CBG: Recent Labs  Lab 11/28/19 0930 11/29/19 0636 11/30/19 0659  GLUCAP 148* 87 89   D-Dimer No results for input(s): DDIMER in the last 72 hours. Hgb A1c No results for input(s): HGBA1C in the last 72 hours. Lipid Profile No results for input(s): CHOL, HDL, LDLCALC, TRIG, CHOLHDL, LDLDIRECT in the last 72 hours. Thyroid function studies Recent Labs    11/28/19 1839  TSH 1.457   Anemia work up Recent Labs    11/28/19 1839 11/30/19 0306  VITAMINB12 258  --   FERRITIN  --  118  TIBC  --  202*  IRON  --  47   Urinalysis     Component Value Date/Time   COLORURINE YELLOW 11/29/2019 [place] 11/29/2019 0047   LABSPEC 1.009 11/29/2019 0047   PHURINE 7.0 11/29/2019 0047   GLUCOSEU NEGATIVE 11/29/2019 0047   HGBUR SMALL (A) 11/29/2019 0047   BILIRUBINUR NEGATIVE 11/29/2019 0047   KETONESUR NEGATIVE 11/29/2019 0047   PROTEINUR NEGATIVE 11/29/2019 0047   UROBILINOGEN 0.2 08/25/2014 0454   NITRITE NEGATIVE 11/29/2019 0047   LEUKOCYTESUR SMALL (A) 11/29/2019 0047   Sepsis Labs Invalid input(s): PROCALCITONIN,  WBC,  LACTICIDVEN Microbiology Recent Results (from the past 240 hour(s))  Respiratory Panel by RT PCR (Flu A&B, Covid) - Nasopharyngeal Swab     Status: None   Collection Time: 11/28/19  2:38 PM   Specimen: Nasopharyngeal Swab  Result Value Ref Range Status   SARS Coronavirus 2 by RT PCR NEGATIVE NEGATIVE Final    Comment: (NOTE) SARS-CoV-2 target nucleic acids are NOT DETECTED.  The SARS-CoV-2 RNA is generally detectable in upper respiratoy specimens during the acute phase of infection. The lowest concentration of SARS-CoV-2 viral copies this assay can detect is 131 copies/mL. A negative result does not preclude SARS-Cov-2 infection and should not be used as the  sole basis for treatment or other patient management decisions. A negative result may occur with  improper specimen collection/handling, submission of specimen other than nasopharyngeal swab, presence of viral mutation(s) within the areas targeted by this assay, and inadequate number of viral copies (<131 copies/mL). A negative result must be combined with clinical observations, patient history, and epidemiological information. The expected result is Negative.  Fact Sheet for Patients:  PinkCheek.be  Fact Sheet for Healthcare Providers:  GravelBags.it  This test is no t yet approved or cleared by the [place] FDA and  has been authorized for detection  and/or diagnosis of SARS-CoV-2 by FDA under an Emergency Use Authorization (EUA). This EUA will remain  in effect (meaning this test can be used) for the duration of the COVID-19 declaration under Section 564(b)(1) of the Act, 21 U.S.C. section 360bbb-3(b)(1), unless the authorization is terminated or revoked sooner.     Influenza A by PCR NEGATIVE NEGATIVE Final   Influenza B by PCR NEGATIVE NEGATIVE Final    Comment: (NOTE) The Xpert Xpress SARS-CoV-2/FLU/RSV assay is intended as an aid in  the diagnosis of influenza from Nasopharyngeal swab specimens and  should not be used as a sole basis for treatment. Nasal washings and  aspirates are unacceptable for Xpert Xpress SARS-CoV-2/FLU/RSV  testing.  Fact Sheet for Patients: PinkCheek.be  Fact Sheet for Healthcare Providers: GravelBags.it  This test is not yet approved or cleared by the [place] FDA and  has been authorized for detection and/or diagnosis of SARS-CoV-2 by  FDA under an Emergency Use Authorization (EUA). This EUA will remain  in effect (meaning this test can be used) for the duration of the  Covid-19 declaration under Section 564(b)(1) of the Act, 21  U.S.C. section 360bbb-3(b)(1), unless the authorization is  terminated or revoked. Performed at [facility], [place] [address], [place], [place] [address]      Time coordinating discharge: 40 minutes  SIGNED:   Elmarie Shiley, MD  [facility]

## 2019-11-30 NOTE — Progress Notes (Signed)
DISCHARGE NOTE SNF [facility] to be discharged [place] per MD order. Patient verbalized understanding.  Skin clean, dry and intact without evidence of skin break down, no evidence of skin tears noted. IV catheter discontinued intact. Site without signs and symptoms of complications. Dressing and pressure applied. Pt denies pain at the site currently. No complaints noted.  Patient free of lines, drains, and wounds.   Discharge packet assembled. An After Visit Summary (AVS) was printed and given to the EMS personnel. Patient escorted via stretcher and discharged to [facility] via ambulance. Report called to accepting facility; all questions and concerns addressed.   Arlyss Repress, RN

## 2019-11-30 NOTE — TOC Initial Note (Signed)
Transition of Care [facility]) - Initial/Assessment Note    Patient Details  Name: Stephanie Donovan MRN: [id number] Date of Birth: 1927-02-17  Transition of Care [facility]) CM/SW Contact:    Sable Feil, LCSW Phone Number: 11/30/2019, 4:44 PM  Clinical Narrative: Talked with daughter [place] [phone]) regarding patient's discharge plan and PT recommendation for ST rehab. Daughter expressed agreement, and gave [facility] as her preference because patient has been there before. [facility] later contacted and informed CSW that they have no beds today.  Daughter advised regarding [facility] and later provided with facility responses. Daughter chose [facility] and admissions person [place] contacted and advised. Patient informed that patient will be transported by non-emergency ambulance transport.                Expected Discharge Plan: [place] ([place]) Barriers to Discharge: Barriers Resolved   Patient Goals and CMS Choice Patient states their goals for this hospitalization and ongoing recovery are:: Daughter and patient agreeable to ST rehab before returning to ALF CMS Medicare.gov Compare Post Acute Care list provided to:: Patient Represenative (must comment) (Daughter advised re: [email or web address]) Choice offered to / list presented to : Adult Children  Expected Discharge Plan and Services Expected Discharge Plan: [place] ([facility])       Living arrangements for the past 2 months: [place] (Patient from [place]) Expected Discharge Date: 11/30/19                                    Prior Living Arrangements/Services Living arrangements for the past 2 months: [place] (Patient from [place]) Lives with:: Facility Resident Patient language and need for interpreter reviewed:: No Do you feel safe going back to the place where you live?: No   Patient going to SNF for ST rehab  before returning to [place]  Need for Family Participation in Patient Care: Yes (Comment) Care giver support system in place?: Yes (comment)   Criminal Activity/Legal Involvement Pertinent to Current Situation/Hospitalization: No - Comment as needed  Activities of Daily Living Home Assistive Devices/Equipment: Wheelchair, Environmental consultant (specify type) ADL Screening (condition at time of admission) Patient's cognitive ability adequate to safely complete daily activities?: No Is the patient deaf or have difficulty hearing?: No Does the patient have difficulty seeing, even when wearing glasses/contacts?: Yes Does the patient have difficulty concentrating, remembering, or making decisions?: Yes Patient able to express need for assistance with ADLs?: Yes Does the patient have difficulty dressing or bathing?: Yes Independently performs ADLs?: No Communication: Needs assistance Is this a change from baseline?: Pre-admission baseline Dressing (OT): Dependent Is this a change from baseline?: Pre-admission baseline Grooming: Dependent Is this a change from baseline?: Pre-admission baseline Feeding: Needs assistance Bathing: Dependent Is this a change from baseline?: Pre-admission baseline Toileting: Dependent Is this a change from baseline?: Pre-admission baseline In/Out Bed: Dependent Is this a change from baseline?: Pre-admission baseline Walks in Home: Dependent Does the patient have difficulty walking or climbing stairs?: Yes Weakness of Legs: Both  Permission Sought/Granted Permission sought to share information with : Family Supports Permission granted to share information with : Yes, Verbal Permission Granted  Share Information with NAME: [place]     Permission granted to share info w Relationship: Daughter  Permission granted to share info w Contact Information: [phone]  Emotional Assessment Appearance:: Appears stated age Attitude/Demeanor/Rapport:  Engaged Affect (typically observed): Pleasant Orientation: :  Oriented to Self Alcohol / Substance Use: Never Used Psych Involvement: No (comment)  Admission diagnosis:  Syncope [R55] Syncope, unspecified syncope type [R55] Patient Active Problem List   Diagnosis Date Noted  . Supracondylar fracture of right femur, closed, initial encounter ([place]) 01/01/2018  . Closed fracture of right distal femur ([place]) 12/24/2017  . Acute blood loss anemia 12/19/2017  . Malnutrition of moderate degree 12/18/2017  . Femur fracture ([place]) 12/17/2017  . Fall 12/17/2017  . HLD (hyperlipidemia) 12/17/2017  . Chronic anemia 12/17/2017  . Closed 4-part fracture of proximal humerus, right, initial encounter 10/09/2017  . Paroxysmal A-fib ([place]) 09/21/2017  . Closed displaced fracture of right femoral neck ([place]) 09/21/2017  . Closed fracture of right proximal humerus 09/21/2017  . Dehydration 09/21/2017  . Hyponatremia 09/21/2017  . Elevated CK 09/21/2017  . Thyroid nodule 09/21/2017  . Closed right hip fracture ([place]) 09/21/2017  . Chronic diastolic CHF (congestive heart failure) ([place]) 09/21/2017  . Sinus tachycardia 09/21/2017  . Acute lower UTI 09/21/2017  . TIA (transient ischemic attack) 08/25/2014  . Aphasia 08/25/2014  . Chronic constipation 05/19/2014  . Fracture of multiple pubic rami ([place]) 04/11/2014  . Pubic bone fracture ([place]) 04/11/2014  . Left hip pain 04/11/2014  . Atrial fibrillation with RVR ([place]) 03/28/2013  . Diarrhea 03/28/2013  . Hypothyroidism 03/24/2013  . Syncope 03/24/2013  . Glaucoma 03/24/2013  . Hypokalemia 03/24/2013  . Acute gastroenteritis 03/24/2013   PCP:  Katherina Mires, MD Pharmacy:  No Pharmacies Listed    Social Determinants of Health (SDOH) Interventions    Readmission Risk Interventions No flowsheet data found.

## 2019-11-30 NOTE — TOC Transition Note (Signed)
Transition of Care [facility]) - CM/SW Discharge Note *11/30/19 - Discharged to [facility]   Patient Details  Name: Stephanie Donovan MRN: [id number] Date of Birth: Jan 13, 1928  [facility]) CM/SW Contact:  Sable Feil, LCSW Phone Number: 11/30/2019, 5:44 PM   Clinical Narrative:  Patient discharging to [facility] today, transported by non-emergency ambulance transport. Discharge clinicals transmitted to facility daughter advised that transport called. Negative COVID results sent in discharge packet.    Final next level of care: [place] ([place] - Room 107) Barriers to Discharge: Barriers Resolved   Patient Goals and CMS Choice Patient states their goals for this hospitalization and ongoing recovery are:: Daughter and patient agreeable to ST rehab before returning to ALF CMS Medicare.gov Compare Post Acute Care list provided to:: Patient Represenative (must comment) Choice offered to / list presented to : Adult Children  Discharge Placement   Existing PASRR number confirmed : 11/30/19          Patient chooses bed at:  [facility]) Patient to be transferred to facility by: Non-emergnecy ambulance transport Name of family member notified: Daughter [place] Patient and family notified of of transfer: 11/30/19  Discharge Plan and [place] for [facility] rehab                                     Social Determinants of Health (SDOH) Interventions  No SDOH interventions requested or needed at discharge    Readmission Risk Interventions No flowsheet data found.

## 2019-11-30 NOTE — NC FL2 (Addendum)
[place] LEVEL OF CARE SCREENING TOOL     IDENTIFICATION  Patient Name: Stephanie Donovan Birthdate: 1927-07-08 Sex: female Admission Date (Current Location): 11/28/2019  [facility] and [place] Number:  Herbalist and Address:         Provider Number: [id number]  Attending Physician Name and Address:  Elmarie Shiley, MD  Relative Name and Phone Number:  Abigaile Rossie - daughter - [phone]    Current Level of Care: Hospital Recommended Level of Care: [place] Prior Approval Number:    Date Approved/Denied:   PASRR Number: [id number]  Discharge Plan: SNF    Current Diagnoses: Patient Active Problem List   Diagnosis Date Noted  . Supracondylar fracture of right femur, closed, initial encounter ([place]) 01/01/2018  . Closed fracture of right distal femur ([place]) 12/24/2017  . Acute blood loss anemia 12/19/2017  . Malnutrition of moderate degree 12/18/2017  . Femur fracture ([place]) 12/17/2017  . Fall 12/17/2017  . HLD (hyperlipidemia) 12/17/2017  . Chronic anemia 12/17/2017  . Closed 4-part fracture of proximal humerus, right, initial encounter 10/09/2017  . Paroxysmal A-fib ([place]) 09/21/2017  . Closed displaced fracture of right femoral neck ([place]) 09/21/2017  . Closed fracture of right proximal humerus 09/21/2017  . Dehydration 09/21/2017  . Hyponatremia 09/21/2017  . Elevated CK 09/21/2017  . Thyroid nodule 09/21/2017  . Closed right hip fracture ([place]) 09/21/2017  . Chronic diastolic CHF (congestive heart failure) ([place]) 09/21/2017  . Sinus tachycardia 09/21/2017  . Acute lower UTI 09/21/2017  . TIA (transient ischemic attack) 08/25/2014  . Aphasia 08/25/2014  . Chronic constipation 05/19/2014  . Fracture of multiple pubic rami ([place]) 04/11/2014  . Pubic bone fracture ([place]) 04/11/2014  . Left hip pain 04/11/2014  . Atrial fibrillation with RVR ([place]) 03/28/2013  . Diarrhea 03/28/2013  . Hypothyroidism 03/24/2013  .  Syncope 03/24/2013  . Glaucoma 03/24/2013  . Hypokalemia 03/24/2013  . Acute gastroenteritis 03/24/2013    Orientation RESPIRATION BLADDER Height & Weight     Self  Normal Continent Weight: 119 lb 14.9 oz (54.4 kg) Height:  5\' 4"  (162.6 cm)  BEHAVIORAL SYMPTOMS/MOOD NEUROLOGICAL BOWEL NUTRITION STATUS      Continent Diet (Heart healthy)  AMBULATORY STATUS COMMUNICATION OF NEEDS Skin   Total Care (Patient non-ambulatory at baseline) Verbally Other (Comment) (Ecchymosis right/left arm)                       Personal Care Assistance Level of Assistance  Bathing, Feeding, Dressing Bathing Assistance: Limited assistance Feeding assistance: Limited assistance (Assistance with set-up) Dressing Assistance: Limited assistance     Functional Limitations Info  Sight, Hearing, Speech Sight Info: Impaired (Has glaucoma) Hearing Info: Adequate Speech Info: Adequate    SPECIAL CARE FACTORS FREQUENCY  PT (By licensed PT)     PT Frequency: Evaluated 11/7. PT at SNF Eval and Treat, a minimum of 5 days per week              Contractures Contractures Info: Not present    Additional Factors Info  Code Status, Allergies Code Status Info: Full Allergies Info: Aspirin, Penicillins, Codeine           Current Medications (11/30/2019):  This is the current hospital active medication list Current Facility-Administered Medications  Medication Dose Route Frequency Provider Last Rate Last Admin  . 0.9 %  sodium chloride infusion   Intravenous Continuous Regalado, Belkys A, MD 50 mL/hr at 11/30/19 1056 New Bag at  11/30/19 1056  . acetaminophen (TYLENOL) tablet 650 mg  650 mg Oral Q6H PRN Wynetta Fines T, MD       Or  . acetaminophen (TYLENOL) suppository 650 mg  650 mg Rectal Q6H PRN Wynetta Fines T, MD      . aspirin EC tablet 81 mg  81 mg Oral Daily Wynetta Fines T, MD   81 mg at 11/30/19 1053  . brimonidine (ALPHAGAN) 0.2 % ophthalmic solution 1 drop  1 drop Left Eye BID Wynetta Fines T,  MD   1 drop at 11/30/19 1054  . brinzolamide (AZOPT) 1 % ophthalmic suspension 1 drop  1 drop Both Eyes BID Wynetta Fines T, MD   1 drop at 11/30/19 1054  . docusate sodium (COLACE) capsule 100 mg  100 mg Oral Daily Wynetta Fines T, MD   100 mg at 11/29/19 2216  . enoxaparin (LOVENOX) injection 40 mg  40 mg Subcutaneous Q24H Wynetta Fines T, MD   40 mg at 11/29/19 1548  . latanoprost (XALATAN) 0.005 % ophthalmic solution 1 drop  1 drop Left Eye QHS Lequita Halt, MD   1 drop at 11/29/19 2215  . levothyroxine (SYNTHROID) tablet 50 mcg  50 mcg Oral Daily Wynetta Fines T, MD   50 mcg at 11/30/19 0540  . magnesium oxide (MAG-OX) tablet 400 mg  400 mg Oral BID Regalado, Belkys A, MD   400 mg at 11/30/19 1053  . metoprolol succinate (TOPROL-XL) 24 hr tablet 25 mg  25 mg Oral Daily Wynetta Fines T, MD   25 mg at 11/30/19 1103  . ondansetron (ZOFRAN) tablet 4 mg  4 mg Oral Q6H PRN Wynetta Fines T, MD       Or  . ondansetron [facility]) injection 4 mg  4 mg Intravenous Q6H PRN Wynetta Fines T, MD      . sodium chloride flush (NS) 0.9 % injection 3 mL  3 mL Intravenous Q12H Wynetta Fines T, MD   3 mL at 11/29/19 1326  . timolol (TIMOPTIC) 0.25 % ophthalmic solution 1 drop  1 drop Both Eyes BID Wynetta Fines T, MD   1 drop at 11/30/19 1054  . vitamin B-12 (CYANOCOBALAMIN) tablet 100 mcg  100 mcg Oral Daily Regalado, Belkys A, MD   100 mcg at 11/30/19 1053     Discharge Medications: Please see discharge summary for a list of discharge medications.  Relevant Imaging Results:  Relevant Lab Results:   Additional Information [id number]. 11/8: Negative COVID test  Sable Feil, LCSW

## 2020-01-12 ENCOUNTER — Emergency Department (HOSPITAL_COMMUNITY): Payer: Medicare Other

## 2020-01-12 ENCOUNTER — Encounter (HOSPITAL_COMMUNITY): Payer: Self-pay

## 2020-01-12 ENCOUNTER — Other Ambulatory Visit: Payer: Self-pay

## 2020-01-12 ENCOUNTER — Observation Stay (HOSPITAL_COMMUNITY)
Admission: EM | Admit: 2020-01-12 | Discharge: 2020-01-12 | Disposition: A | Discharge status: Home / Self Care | Payer: Medicare Other | Attending: Internal Medicine | Admitting: Internal Medicine

## 2020-01-12 DIAGNOSIS — Z8673 Personal history of transient ischemic attack (TIA), and cerebral infarction without residual deficits: Secondary | ICD-10-CM | POA: Diagnosis not present

## 2020-01-12 DIAGNOSIS — I959 Hypotension, unspecified: Secondary | ICD-10-CM | POA: Diagnosis not present

## 2020-01-12 DIAGNOSIS — R55 Syncope and collapse: Secondary | ICD-10-CM | POA: Diagnosis present

## 2020-01-12 DIAGNOSIS — Z96641 Presence of right artificial hip joint: Secondary | ICD-10-CM | POA: Insufficient documentation

## 2020-01-12 DIAGNOSIS — Z7982 Long term (current) use of aspirin: Secondary | ICD-10-CM | POA: Insufficient documentation

## 2020-01-12 DIAGNOSIS — Z20822 Contact with and (suspected) exposure to covid-19: Secondary | ICD-10-CM | POA: Diagnosis not present

## 2020-01-12 DIAGNOSIS — R0902 Hypoxemia: Secondary | ICD-10-CM | POA: Diagnosis not present

## 2020-01-12 DIAGNOSIS — I5032 Chronic diastolic (congestive) heart failure: Secondary | ICD-10-CM | POA: Diagnosis not present

## 2020-01-12 DIAGNOSIS — E039 Hypothyroidism, unspecified: Secondary | ICD-10-CM | POA: Diagnosis not present

## 2020-01-12 DIAGNOSIS — Z79899 Other long term (current) drug therapy: Secondary | ICD-10-CM | POA: Insufficient documentation

## 2020-01-12 LAB — URINALYSIS, ROUTINE W REFLEX MICROSCOPIC
Bacteria, UA: NONE SEEN
Bilirubin Urine: NEGATIVE
Glucose, UA: NEGATIVE mg/dL
Ketones, ur: NEGATIVE mg/dL
Leukocytes,Ua: NEGATIVE
Nitrite: NEGATIVE
Protein, ur: NEGATIVE mg/dL
Specific Gravity, Urine: 1.017 (ref 1.005–1.030)
pH: 5 (ref 5.0–8.0)

## 2020-01-12 LAB — LACTIC ACID, PLASMA
Lactic Acid, Venous: 1.1 mmol/L (ref 0.5–1.9)
Lactic Acid, Venous: 0.9 mmol/L (ref 0.5–1.9)

## 2020-01-12 LAB — TROPONIN I (HIGH SENSITIVITY)
Troponin I (High Sensitivity): <2 ng/L (ref <18)
Troponin I (High Sensitivity): 4 ng/L (ref <18)

## 2020-01-12 LAB — CBC WITH DIFFERENTIAL/PLATELET
Abs Immature Granulocytes: 0.03 10*3/uL (ref 0.00–0.07)
Basophils Absolute: 0.1 10*3/uL (ref 0.0–0.1)
Basophils Relative: 1 %
Eosinophils Absolute: 0.1 10*3/uL (ref 0.0–0.5)
Eosinophils Relative: 1 %
HCT: 32.2 % — ABNORMAL LOW (ref 36.0–46.0)
Hemoglobin: 10 g/dL — ABNORMAL LOW (ref 12.0–15.0)
Immature Granulocytes: 0 %
Lymphocytes Relative: 13 %
Lymphs Abs: 1 10*3/uL (ref 0.7–4.0)
MCH: 27.2 pg (ref 26.0–34.0)
MCHC: 31.1 g/dL (ref 30.0–36.0)
MCV: 87.5 fL (ref 80.0–100.0)
Monocytes Absolute: 0.5 10*3/uL (ref 0.1–1.0)
Monocytes Relative: 7 %
Neutro Abs: 5.9 10*3/uL (ref 1.7–7.7)
Neutrophils Relative %: 78 %
Platelets: 258 10*3/uL (ref 150–400)
RBC: 3.68 MIL/uL — ABNORMAL LOW (ref 3.87–5.11)
RDW: 15.6 % — ABNORMAL HIGH (ref 11.5–15.5)
WBC: 7.5 10*3/uL (ref 4.0–10.5)
nRBC: 0 % (ref 0.0–0.2)

## 2020-01-12 LAB — RESP PANEL BY RT-PCR (FLU A&B, COVID) ARPGX2
Influenza A by PCR: NEGATIVE
Influenza B by PCR: NEGATIVE
SARS Coronavirus 2 by RT PCR: NEGATIVE

## 2020-01-12 LAB — COMPREHENSIVE METABOLIC PANEL
ALT: 10 U/L (ref 0–44)
AST: 13 U/L — ABNORMAL LOW (ref 15–41)
Albumin: 2.6 g/dL — ABNORMAL LOW (ref 3.5–5.0)
Alkaline Phosphatase: 96 U/L (ref 38–126)
Anion gap: 8 (ref 5–15)
BUN: 11 mg/dL (ref 8–23)
CO2: 25 mmol/L (ref 22–32)
Calcium: 8.2 mg/dL — ABNORMAL LOW (ref 8.9–10.3)
Chloride: 104 mmol/L (ref 98–111)
Creatinine, Ser: 0.6 mg/dL (ref 0.44–1.00)
GFR, Estimated: >60 mL/min (ref >60)
Glucose, Bld: 133 mg/dL — ABNORMAL HIGH (ref 70–99)
Potassium: 3.7 mmol/L (ref 3.5–5.1)
Sodium: 137 mmol/L (ref 135–145)
Total Bilirubin: 0.4 mg/dL (ref 0.3–1.2)
Total Protein: 6 g/dL — ABNORMAL LOW (ref 6.5–8.1)

## 2020-01-12 LAB — MAGNESIUM: Magnesium: 1.8 mg/dL (ref 1.7–2.4)

## 2020-01-12 LAB — BRAIN NATRIURETIC PEPTIDE: B Natriuretic Peptide: 114.3 pg/mL — ABNORMAL HIGH (ref 0.0–100.0)

## 2020-01-12 LAB — TSH: TSH: 1.19 u[IU]/mL (ref 0.350–4.500)

## 2020-01-12 MED ORDER — SODIUM CHLORIDE 0.9 % IV BOLUS
500.0000 mL | Freq: Once | INTRAVENOUS | Status: AC
  Administered 2020-01-12: 500 mL via INTRAVENOUS

## 2020-01-12 NOTE — Progress Notes (Addendum)
Patient seen and examined at bedside for concern of new oxygen requirement and syncope at the request of Dr. Sherry Ruffing  Patient has recurrent syncopal episodes thought to be from orthostatic hypotension in the past. At bedside patient is awake and alert, normotensive and tolerating room air with conversation and SpO2 maintaining in the mid 90s. She is normotensive and bradycardia has resolved. CXR showed atelectasis but was otherwise unremarkable.   -She may be having recurrent syncope secondary to her beta blocker as she had bradycardia and hypotension and is on Toprol XL 25 mg daily. I would consider decreasing this dose -Does not appear to be in CHF. -If orthostasis continues to be a concern then I would start with compression stockings -Encourage incentive spirometry -Given that her vitals have returned to baseline and she is on room air, I do not believe she needs to be admitted to the hospital. I recommend close follow up with her primary care physician and return to the ED if any further issues. This has been communicated to Dr. Shyrl Numbers, DO

## 2020-01-12 NOTE — ED Notes (Signed)
Pt educated on incentive spirometer. Verbalized and demonstrated. Awaiting [facility] for transport

## 2020-01-12 NOTE — Progress Notes (Signed)
.   Transition of Care [facility]) - Emergency Department Mini Assessment   Patient Details  Name: Stephanie Donovan MRN: [id number] Date of Birth: August 15, 1927  Transition of Care [facility]) CM/SW Contact:    Erenest Rasher, RN Phone Number: [phone] 01/12/2020, 5:39 PM   Clinical Narrative: TOC CM spoke to attending for dc needs. Pt will need incentive spirometer. TOC CM notified ED RN and she will check oxygen prior to dc. Pt scheduled to dc back to [facility]. PTAR was arranged by ED RN.    ED Mini Assessment: What brought you to the Emergency Department? : syncopal episode  Barriers to Discharge: No Barriers Identified     Means of departure: Ambulance  Interventions which prevented an admission or readmission: Other (must enter comment)    Patient Contact and Communications        ,                 Admission diagnosis:  Syncope [R55] Patient Active Problem List   Diagnosis Date Noted  . Supracondylar fracture of right femur, closed, initial encounter ([place]) 01/01/2018  . Closed fracture of right distal femur ([place]) 12/24/2017  . Acute blood loss anemia 12/19/2017  . Malnutrition of moderate degree 12/18/2017  . Femur fracture ([place]) 12/17/2017  . Fall 12/17/2017  . HLD (hyperlipidemia) 12/17/2017  . Chronic anemia 12/17/2017  . Closed 4-part fracture of proximal humerus, right, initial encounter 10/09/2017  . Paroxysmal A-fib ([place]) 09/21/2017  . Closed displaced fracture of right femoral neck ([place]) 09/21/2017  . Closed fracture of right proximal humerus 09/21/2017  . Dehydration 09/21/2017  . Hyponatremia 09/21/2017  . Elevated CK 09/21/2017  . Thyroid nodule 09/21/2017  . Closed right hip fracture ([place]) 09/21/2017  . Chronic diastolic CHF (congestive heart failure) ([place]) 09/21/2017  . Sinus tachycardia 09/21/2017  . Acute lower UTI 09/21/2017  . TIA (transient ischemic attack) 08/25/2014  . Aphasia 08/25/2014  . Chronic constipation  05/19/2014  . Fracture of multiple pubic rami ([place]) 04/11/2014  . Pubic bone fracture ([place]) 04/11/2014  . Left hip pain 04/11/2014  . Atrial fibrillation with RVR ([place]) 03/28/2013  . Diarrhea 03/28/2013  . Hypothyroidism 03/24/2013  . Syncope 03/24/2013  . Glaucoma 03/24/2013  . Hypokalemia 03/24/2013  . Acute gastroenteritis 03/24/2013   PCP:  Katherina Mires, MD Pharmacy:  No Pharmacies Listed

## 2020-01-12 NOTE — ED Triage Notes (Signed)
Pt arrives from [place], syncopal episode at breakfast. BP 96/60 500NS and 4 mg zofran fiven by EMS. Pt back to baseline. C/o dizziness

## 2020-01-12 NOTE — ED Notes (Signed)
PT[facility] notified for transport. Unknown ETA 

## 2020-01-12 NOTE — ED Provider Notes (Signed)
[facility] DEPT Provider Note   CSN: [id number] Arrival date & time: 01/12/20  [id number]     History Chief Complaint  Patient presents with  . Hypotension  Syncope   Stephanie Donovan is a 86 y.o. female.  The history is provided by the patient and medical records. No language interpreter was used.  Loss of Consciousness Episode history:  Single Most recent episode:  Today Duration:  1 minute Timing:  Sporadic Progression:  Resolved Chronicity:  Recurrent Witnessed: yes   Relieved by:  Nothing Worsened by:  Nothing Ineffective treatments:  None tried Associated symptoms: malaise/fatigue and shortness of breath   Associated symptoms: no anxiety, no chest pain, no confusion, no diaphoresis, no difficulty breathing, no dizziness, no fever, no focal weakness, no headaches, no nausea, no palpitations, no recent fall, no recent injury, no recent surgery, no rectal bleeding, no vomiting and no weakness   Risk factors: no seizures        Past Medical History:  Diagnosis Date  . Blood transfusion without reported diagnosis   . Chronic back pain   . Chronic constipation   . Dysrhythmia    Afib with RVR - 2015, Oaroxyomal Afib  . GI bleed   . Glaucoma   . Syncope 2015  . Thyroid disease   . TIA (transient ischemic attack) 2015    Patient Active Problem List   Diagnosis Date Noted  . Supracondylar fracture of right femur, closed, initial encounter ([place]) 01/01/2018  . Closed fracture of right distal femur ([place]) 12/24/2017  . Acute blood loss anemia 12/19/2017  . Malnutrition of moderate degree 12/18/2017  . Femur fracture ([place]) 12/17/2017  . Fall 12/17/2017  . HLD (hyperlipidemia) 12/17/2017  . Chronic anemia 12/17/2017  . Closed 4-part fracture of proximal humerus, right, initial encounter 10/09/2017  . Paroxysmal A-fib ([place]) 09/21/2017  . Closed displaced fracture of right femoral neck ([place]) 09/21/2017  . Closed fracture of right  proximal humerus 09/21/2017  . Dehydration 09/21/2017  . Hyponatremia 09/21/2017  . Elevated CK 09/21/2017  . Thyroid nodule 09/21/2017  . Closed right hip fracture ([place]) 09/21/2017  . Chronic diastolic CHF (congestive heart failure) ([place]) 09/21/2017  . Sinus tachycardia 09/21/2017  . Acute lower UTI 09/21/2017  . TIA (transient ischemic attack) 08/25/2014  . Aphasia 08/25/2014  . Chronic constipation 05/19/2014  . Fracture of multiple pubic rami ([place]) 04/11/2014  . Pubic bone fracture ([place]) 04/11/2014  . Left hip pain 04/11/2014  . Atrial fibrillation with RVR ([place]) 03/28/2013  . Diarrhea 03/28/2013  . Hypothyroidism 03/24/2013  . Syncope 03/24/2013  . Glaucoma 03/24/2013  . Hypokalemia 03/24/2013  . Acute gastroenteritis 03/24/2013    Past Surgical History:  Procedure Laterality Date  . CATARACT EXTRACTION, BILATERAL    . COLONOSCOPY    . HIP ARTHROPLASTY Right 09/22/2017   Procedure: ARTHROPLASTY BIPOLAR HIP (HEMIARTHROPLASTY);  Surgeon: [name], MD;  Location: [facility];  Service: Orthopedics;  Laterality: Right;  . JOINT REPLACEMENT  11/20/2011   LTKR  . ORIF FEMUR FRACTURE Right 12/20/2017   Procedure: OPEN REDUCTION INTERNAL FIXATION (ORIF) DISTAL FEMUR FRACTURE;  Surgeon: [name], MD;  Location: [facility];  Service: Orthopedics;  Laterality: Right;  . REVERSE SHOULDER ARTHROPLASTY Right 10/09/2017  . REVERSE SHOULDER ARTHROPLASTY Right 10/09/2017   Procedure: REVERSE SHOULDER ARTHROPLASTY;  Surgeon: [name], MD;  Location: [facility];  Service: Orthopedics;  Laterality: Right;     OB History   No obstetric history on file.  Family History  Problem Relation Age of Onset  . Osteoporosis Mother   . Cancer - Colon Father   . Schizophrenia Son     Social History   Tobacco Use  . Smoking status: Never Smoker  . Smokeless tobacco: Never Used  Vaping Use  . Vaping Use: Never used  Substance Use Topics  . Alcohol use: No  . Drug use: No     Home Medications Prior to Admission medications   Medication Sig Start Date End Date Taking? Authorizing Provider  aspirin EC 81 MG tablet Take 81 mg by mouth daily.    [provider]  AZOPT 1 % ophthalmic suspension Place 1 drop into both eyes 2 (two) times daily. 09/25/18   [provider]  brimonidine (ALPHAGAN) 0.2 % ophthalmic solution Place 1 drop into the left eye 2 (two) times daily. 09/10/18   [provider]  docusate sodium (COLACE) 100 MG capsule Take 100 mg by mouth daily.    [provider]  furosemide (LASIX) 20 MG tablet Take 1 tablet (20 mg total) by mouth daily as needed for edema. 11/30/19   Regalado, Belkys A, MD  latanoprost (XALATAN) 0.005 % ophthalmic solution Place 1 drop into the left eye at bedtime. 09/10/18   [provider]  levothyroxine (SYNTHROID) 75 MCG tablet Take 50 mcg by mouth daily.  09/16/18   [provider]  magnesium oxide (MAG-OX) 400 (241.3 Mg) MG tablet Take 1 tablet (400 mg total) by mouth 2 (two) times daily. 11/30/19   Regalado, Belkys A, MD  metoprolol succinate (TOPROL-XL) 25 MG 24 hr tablet Take 25 mg by mouth daily.    [provider]  TIMOPTIC OCUDOSE 0.25 % SOLN Place 1 drop into both eyes 2 (two) times daily. 09/27/18   [provider]  vitamin B-12 100 MCG tablet Take 1 tablet (100 mcg total) by mouth daily. 11/30/19   Regalado, Belkys A, MD    Allergies    Aspirin, Penicillins, and Codeine  Review of Systems   Review of Systems  Constitutional: Positive for fatigue and malaise/fatigue. Negative for chills, diaphoresis and fever.  HENT: Negative for congestion.   Eyes: Negative for visual disturbance.  Respiratory: Positive for shortness of breath. Negative for cough, chest tightness and wheezing.   Cardiovascular: Positive for syncope. Negative for chest pain and palpitations.  Gastrointestinal: Negative for abdominal pain, constipation, diarrhea, nausea and vomiting.   Genitourinary: Negative for dysuria, flank pain and frequency.  Musculoskeletal: Negative for back pain, neck pain and neck stiffness.  Skin: Negative for rash and wound.  Neurological: Negative for dizziness, focal weakness, weakness, light-headedness, numbness and headaches.  Psychiatric/Behavioral: Negative for confusion.  All other systems reviewed and are negative.   Physical Exam Updated Vital Signs BP (!) 94/59 (BP Location: Left Arm)   Pulse (!) 58   Temp 98 F (36.7 C) (Oral)   Resp 13   SpO2 99%   Physical Exam Vitals and nursing note reviewed.  Constitutional:      General: She is not in acute distress.    Appearance: She is well-developed and well-nourished. She is ill-appearing. She is not toxic-appearing or diaphoretic.  HENT:     Head: Normocephalic and atraumatic.     Right Ear: External ear normal.     Left Ear: External ear normal.     Nose: Nose normal.     Mouth/Throat:     Mouth: Oropharynx is clear and moist. Mucous membranes are dry.  Pharynx: No oropharyngeal exudate or posterior oropharyngeal erythema.  Eyes:     Extraocular Movements: Extraocular movements intact and EOM normal.     Conjunctiva/sclera: Conjunctivae normal.     Pupils: Pupils are equal, round, and reactive to light.  Cardiovascular:     Rate and Rhythm: Bradycardia present.     Pulses: Normal pulses.     Heart sounds: No murmur heard.   Pulmonary:     Effort: No respiratory distress.     Breath sounds: No stridor. No wheezing, rhonchi or rales.  Chest:     Chest wall: No tenderness.  Abdominal:     General: There is no distension.     Tenderness: There is no abdominal tenderness. There is no right CVA tenderness, left CVA tenderness, guarding or rebound.  Musculoskeletal:        General: No tenderness or signs of injury.     Cervical back: Normal range of motion and neck supple.     Right lower leg: Edema present.     Left lower leg: Edema present.  Skin:    General:  Skin is warm.     Capillary Refill: Capillary refill takes less than 2 seconds.     Coloration: Skin is pale.     Findings: No erythema or rash.  Neurological:     Mental Status: She is alert and oriented to person, place, and time.     Motor: No abnormal muscle tone.     Coordination: Coordination normal.     Deep Tendon Reflexes: Reflexes are normal and symmetric.  Psychiatric:        Mood and Affect: Mood normal.     ED Results / Procedures / Treatments   Labs (all labs ordered are listed, but only abnormal results are displayed) Labs Reviewed  CBC WITH DIFFERENTIAL/PLATELET - Abnormal; Notable for the following components:      Result Value   RBC 3.68 (*)    Hemoglobin 10.0 (*)    HCT 32.2 (*)    RDW 15.6 (*)    All other components within normal limits  COMPREHENSIVE METABOLIC PANEL - Abnormal; Notable for the following components:   Glucose, Bld 133 (*)    Calcium 8.2 (*)    Total Protein 6.0 (*)    Albumin 2.6 (*)    AST 13 (*)    All other components within normal limits  BRAIN NATRIURETIC PEPTIDE - Abnormal; Notable for the following components:   B Natriuretic Peptide 114.3 (*)    All other components within normal limits  URINALYSIS, ROUTINE W REFLEX MICROSCOPIC - Abnormal; Notable for the following components:   APPearance HAZY (*)    Hgb urine dipstick SMALL (*)    All other components within normal limits  RESP PANEL BY RT-PCR (FLU A&B, COVID) ARPGX2  URINE CULTURE  LACTIC ACID, PLASMA  LACTIC ACID, PLASMA  MAGNESIUM  TSH  TROPONIN I (HIGH SENSITIVITY)  TROPONIN I (HIGH SENSITIVITY)    EKG EKG Interpretation  Date/Time:  Tuesday January 12 2020 10:17:59 EST Ventricular Rate:  59 PR Interval:    QRS Duration: 95 QT Interval:  487 QTC Calculation: 483 R Axis:   76 Text Interpretation: Sinus rhythm Atrial premature complexes in couplets Abnormal R-wave progression, early transition When compared to prior, similar appereance. No STEMI Confirmed by  Antony Blackbird [phone]) on 01/12/2020 10:54:33 AM   Radiology DG Chest Portable 1 View  Result Date: 01/12/2020 CLINICAL DATA:  Syncope, hypotension, shortness of breath, hypoxia, fatigue, syncopal  episode at breakfast, dizziness EXAM: PORTABLE CHEST 1 VIEW COMPARISON:  Portable exam 1003 hours compared to 11/28/2019 FINDINGS: Upper normal heart size. Mediastinal contours and pulmonary vascularity normal. Atherosclerotic calcification aorta. Minimal chronic atelectasis at LEFT base. Remaining lungs clear. No acute infiltrate, pleural effusion or pneumothorax. Bones demineralized with note of RIGHT shoulder prosthesis. IMPRESSION: Minimal chronic LEFT basilar atelectasis. No acute abnormalities. Aortic Atherosclerosis (ICD10-I70.0). Electronically Signed   By: Lavonia Dana M.D.   On: 01/12/2020 10:11    Procedures Procedures (including critical care time)  Medications Ordered in ED Medications  sodium chloride 0.9 % bolus 500 mL (500 mLs Intravenous New Bag/Given 01/12/20 1256)    ED Course  I have reviewed the triage vital signs and the nursing notes.  Pertinent labs & imaging results that were available during my care of the patient were reviewed by me and considered in my medical decision making (see chart for details).    MDM Rules/Calculators/A&P                          ALLYSUN TRINDLE is a 86 y.o. female with a past medical history significant for A. Fib not on anticoagulation, CHF, hypothyroidism, TIA, history of GI bleed, and recent admission last month for syncope thought to relate to orthostatic hypotension who presents with syncope and hypotension. Patient says that she has felt tired for the last few days and this morning while waiting for breakfast had a syncopal episode while sitting in a chair. She did not fall or hit her head. She was reportedly out for around 1 minute and then came to. There is no postictal. She did not have loss of bowel or bladder control and did not  bite her tongue. There is no reported shaking. Patient was found to be hypotensive with EMS with blood pressure in the [id number] and 0000000 systolic on route. She was given 500 cc normal saline and blood pressure improved into the 90s on arrival. She is not tachycardic and is actually bradycardic in the 50s and occasionally in the 60s. She is denying any memory of what happened. She denies any chest pain, palpitations, nausea, or vomiting. She does report some shortness of breath. Oxygen saturations were found to be in the 80s after she was weaned from oxygen with EMS on arrival here. She was placed back on 2 L to maintain oxygen saturations in the 90s. Patient denies any constipation, diarrhea, or urinary changes. She denies dark tarry stools or other complaints other than feeling tired and the syncopal episode.  On exam, lungs are clear and chest is nontender. Abdomen is nontender. She is moving all extremities. She does appear pale. Bowel sounds were appreciated. She is slightly bradycardic and hypotensive but is now maintaining oxygen saturations on 2 L. She is not febrile. She does have edematous legs bilaterally which she says is worse than baseline.  Clinically I am concerned about potential CHF exacerbation leading to fatigue, bilateral leg edema, and the shortness of breath with hypoxia however given the ongoing pandemic, we will check for pneumonia, Covid, or other causes such as anemia given her history of anemia or other occult infection.  Given her syncope, hypotension, and new oxygen requirement she does not take oxygen at home, to anticipate admission.  Anticipate reassessment for work-up.  2:31 PM Work-up is returned and does not show any acute evidence of infections.  Urinalysis shows no infection.  Culture was sent.  Lactic acid not  elevated.  Troponin not elevated.  Covid and flu test negative.  Minimal anemia but no leukocytosis.  LFTs not elevated and creatinine elevated.  Electrolytes grossly  reassuring.  Mild hypocalcemia.  BNP elevated at 114 but not significant elevated.  TSH nonelevated.  Magnesium not elevated.  Chest x-ray shows normal pulmonary vasculature with some chronic atelectasis.  No acute abnormalities.  Unclear etiology the patient's hypoxia, syncope, hypotension.  After some fluids her pressure has improved but she still feels fatigued.  Given the history of CHF, hypotension, syncope, and persistent hypoxia, she is not safe to go home.  We try to move her oxygen a minute ago and it dropped again to the 80s.  She is still on 2 L nasal cannula.  We will call for admission for further management.  4:12 PM Admitting team came to the patient and reports that she is now normotensive would like to go home and is off of oxygen maintaining oxygen saturations.  I also reassessed the patient and she is doing better.  I had discussion with him and although she has had the concerning syncope today with hypotension, he feels she is doing well and is stable for her to back to nursing facility.  He did recommend we decrease her beta-blocker by half and have her call her doctor tomorrow.  I spoke with the patient and she agrees with this plan.  She agrees with holding on admission at this time.  We will have her decrease her Toprol-XL 25 mg tablet to 12.5 mg daily and call her primary doctor tomorrow for further guidance.  Patient will be discharged.    Final Clinical Impression(s) / ED Diagnoses Final diagnoses:  Hypotension, unspecified hypotension type  Syncope, unspecified syncope type  Hypoxia    Rx / DC Orders ED Discharge Orders    None      Clinical Impression: 1. Hypotension, unspecified hypotension type   2. Syncope, unspecified syncope type   3. Hypoxia     Disposition: Discharge  Condition: Good  I have discussed the results, Dx and Tx plan with the pt(& family if present). He/she/they expressed understanding and agree(s) with the plan. Discharge  instructions discussed at great length. Strict return precautions discussed and pt &/or family have verbalized understanding of the instructions. No further questions at time of discharge.    New Prescriptions   No medications on file    Follow Up: Katherina Mires, MD [place] [id number] [phone]     [facility] DEPT [place] [id number] [place] [phone]    Your cardiologist        Saheed Carrington, Gwenyth Allegra, MD 01/12/20 1623

## 2020-01-12 NOTE — Discharge Instructions (Signed)
Your history and exam today are consistent with a syncopal episode related to low blood pressures.  We look for infection but do not find evidence of infection or other significant electrolyte abnormality.  Initially, the plan was to admit given the syncope and low blood pressure earlier today however your oxygen saturations improved and you did not need to remain on oxygen.  Also, your blood pressure improved after some fluids.  The admitting team saw you and did not feel you need admission and after discussion, you agreed for discharge with plans to decrease your home beta-blocker by half and call your primary doctor tomorrow.  Please take half of the metoprolol daily instead of the full 25 mg.  Please rest and stay hydrated.  If any symptoms change or worsen or recur, please return to the nearest emergency department.

## 2020-01-13 LAB — URINE CULTURE: Culture: <10000 — AB

## 2020-01-20 ENCOUNTER — Other Ambulatory Visit: Payer: Self-pay

## 2020-01-20 ENCOUNTER — Encounter (HOSPITAL_COMMUNITY): Payer: Self-pay | Admitting: Emergency Medicine

## 2020-01-20 ENCOUNTER — Emergency Department (HOSPITAL_COMMUNITY): Payer: Medicare Other

## 2020-01-20 ENCOUNTER — Emergency Department (HOSPITAL_COMMUNITY)
Admission: EM | Admit: 2020-01-20 | Discharge: 2020-01-21 | Disposition: A | Discharge status: Home / Self Care | Payer: Medicare Other | Attending: Emergency Medicine | Admitting: Emergency Medicine

## 2020-01-20 DIAGNOSIS — Z96611 Presence of right artificial shoulder joint: Secondary | ICD-10-CM | POA: Insufficient documentation

## 2020-01-20 DIAGNOSIS — Z8673 Personal history of transient ischemic attack (TIA), and cerebral infarction without residual deficits: Secondary | ICD-10-CM | POA: Insufficient documentation

## 2020-01-20 DIAGNOSIS — Z79899 Other long term (current) drug therapy: Secondary | ICD-10-CM | POA: Diagnosis not present

## 2020-01-20 DIAGNOSIS — Z96641 Presence of right artificial hip joint: Secondary | ICD-10-CM | POA: Diagnosis not present

## 2020-01-20 DIAGNOSIS — Z7982 Long term (current) use of aspirin: Secondary | ICD-10-CM | POA: Diagnosis not present

## 2020-01-20 DIAGNOSIS — R531 Weakness: Secondary | ICD-10-CM | POA: Diagnosis present

## 2020-01-20 DIAGNOSIS — N3000 Acute cystitis without hematuria: Secondary | ICD-10-CM | POA: Diagnosis not present

## 2020-01-20 DIAGNOSIS — R410 Disorientation, unspecified: Secondary | ICD-10-CM | POA: Insufficient documentation

## 2020-01-20 DIAGNOSIS — E039 Hypothyroidism, unspecified: Secondary | ICD-10-CM | POA: Diagnosis not present

## 2020-01-20 LAB — COMPREHENSIVE METABOLIC PANEL
ALT: 10 U/L (ref 0–44)
AST: 12 U/L — ABNORMAL LOW (ref 15–41)
Albumin: 3.3 g/dL — ABNORMAL LOW (ref 3.5–5.0)
Alkaline Phosphatase: 110 U/L (ref 38–126)
Anion gap: 11 (ref 5–15)
BUN: 12 mg/dL (ref 8–23)
CO2: 26 mmol/L (ref 22–32)
Calcium: 8.8 mg/dL — ABNORMAL LOW (ref 8.9–10.3)
Chloride: 100 mmol/L (ref 98–111)
Creatinine, Ser: 0.64 mg/dL (ref 0.44–1.00)
GFR, Estimated: >60 mL/min (ref >60)
Glucose, Bld: 101 mg/dL — ABNORMAL HIGH (ref 70–99)
Potassium: 3.9 mmol/L (ref 3.5–5.1)
Sodium: 137 mmol/L (ref 135–145)
Total Bilirubin: 0.7 mg/dL (ref 0.3–1.2)
Total Protein: 7.2 g/dL (ref 6.5–8.1)

## 2020-01-20 LAB — CBC WITH DIFFERENTIAL/PLATELET
Abs Immature Granulocytes: 0.02 10*3/uL (ref 0.00–0.07)
Basophils Absolute: 0.1 10*3/uL (ref 0.0–0.1)
Basophils Relative: 1 %
Eosinophils Absolute: 0 10*3/uL (ref 0.0–0.5)
Eosinophils Relative: 1 %
HCT: 37.3 % (ref 36.0–46.0)
Hemoglobin: 11.9 g/dL — ABNORMAL LOW (ref 12.0–15.0)
Immature Granulocytes: 0 %
Lymphocytes Relative: 15 %
Lymphs Abs: 1.2 10*3/uL (ref 0.7–4.0)
MCH: 27.7 pg (ref 26.0–34.0)
MCHC: 31.9 g/dL (ref 30.0–36.0)
MCV: 86.7 fL (ref 80.0–100.0)
Monocytes Absolute: 0.7 10*3/uL (ref 0.1–1.0)
Monocytes Relative: 9 %
Neutro Abs: 5.8 10*3/uL (ref 1.7–7.7)
Neutrophils Relative %: 74 %
Platelets: 329 10*3/uL (ref 150–400)
RBC: 4.3 MIL/uL (ref 3.87–5.11)
RDW: 16.2 % — ABNORMAL HIGH (ref 11.5–15.5)
WBC: 7.8 10*3/uL (ref 4.0–10.5)
nRBC: 0 % (ref 0.0–0.2)

## 2020-01-20 LAB — URINALYSIS, ROUTINE W REFLEX MICROSCOPIC
Bilirubin Urine: NEGATIVE
Glucose, UA: NEGATIVE mg/dL
Ketones, ur: NEGATIVE mg/dL
Nitrite: POSITIVE — AB
Protein, ur: NEGATIVE mg/dL
Specific Gravity, Urine: 1.013 (ref 1.005–1.030)
WBC, UA: >50 WBC/hpf — ABNORMAL HIGH (ref 0–5)
pH: 6 (ref 5.0–8.0)

## 2020-01-20 LAB — CBG MONITORING, ED: Glucose-Capillary: 89 mg/dL (ref 70–99)

## 2020-01-20 MED ORDER — SODIUM CHLORIDE 0.9 % IV SOLN: INTRAVENOUS | Status: DC

## 2020-01-20 MED ORDER — CEPHALEXIN 500 MG PO CAPS
500.0000 mg | ORAL_CAPSULE | Freq: Once | ORAL | Status: AC
  Administered 2020-01-20: 500 mg via ORAL
  Filled 2020-01-20: qty 1

## 2020-01-20 MED ORDER — LORAZEPAM 1 MG PO TABS
1.0000 mg | ORAL_TABLET | Freq: Once | ORAL | Status: AC
  Administered 2020-01-20: 1 mg via ORAL
  Filled 2020-01-20: qty 1

## 2020-01-20 MED ORDER — CEPHALEXIN 500 MG PO CAPS: 500.0000 mg | ORAL_CAPSULE | Freq: Four times a day (QID) | ORAL | 0 refills | Status: DC

## 2020-01-20 NOTE — ED Notes (Signed)
Pt refusing to have vital signs completed at this time

## 2020-01-20 NOTE — ED Triage Notes (Signed)
BIBA Per EMS:  Pt coming from [facility] with complaints of weakness. Pt was fine this morning and took meds but then around 8:30am began to feel different. [facility] staff says this happens to pt  A lot but unable to describe what cause is. Pt A&Ox3 at baseline  Vitals:  138/72 126CBG 96% RA 98.4 86HR 20G R forearm

## 2020-01-20 NOTE — Discharge Instructions (Addendum)
Work-up seems to be consistent with urinary tract infection.  Urine culture sent.  Given first dose of Keflex here.  Prescription provided to continue on Keflex.  Would expect improvement over the next couple days.  Return for any new or worse symptoms.

## 2020-01-20 NOTE — ED Provider Notes (Addendum)
[facility]-EMERGENCY DEPT Provider Note   CSN: [id number] Arrival date & time: 01/20/20  0941     History Chief Complaint  Patient presents with   Weakness    Stephanie Donovan is a 86 y.o. female.  Patient sent in from [facility] facility.  Patient was plain have weakness.  Staff states that this happens at times.  She took her medication as scheduled at 8:30 in the morning and then she began to feel different.  Chart review shows that patient was last seen in emergency department on December 21.  Extensive work-up for hypotension and then things resolved and they could not find any [place] causes and patient was discharged back.  Patient is seen frequently for generalized weakness.  Upon arrival here there seems to be some degree of confusion.  Patient denies anything being wrong currently.        Past Medical History:  Diagnosis Date   Blood transfusion without reported diagnosis    Chronic back pain    Chronic constipation    Dysrhythmia    Afib with RVR - 2015, Oaroxyomal Afib   GI bleed    Glaucoma    Syncope 2015   Thyroid disease    TIA (transient ischemic attack) 2015    Patient Active Problem List   Diagnosis Date Noted   Supracondylar fracture of right femur, closed, initial encounter (HCC) 01/01/2018   Closed fracture of right distal femur (HCC) 12/24/2017   Acute blood loss anemia 12/19/2017   Malnutrition of moderate degree 12/18/2017   Femur fracture (HCC) 12/17/2017   Fall 12/17/2017   HLD (hyperlipidemia) 12/17/2017   Chronic anemia 12/17/2017   Closed 4-part fracture of proximal humerus, right, initial encounter 10/09/2017   Paroxysmal A-fib (HCC) 09/21/2017   Closed displaced fracture of right femoral neck (HCC) 09/21/2017   Closed fracture of right proximal humerus 09/21/2017   Dehydration 09/21/2017   Hyponatremia 09/21/2017   Elevated CK 09/21/2017   Thyroid nodule 09/21/2017    Closed right hip fracture (HCC) 09/21/2017   Chronic diastolic CHF (congestive heart failure) (HCC) 09/21/2017   Sinus tachycardia 09/21/2017   Acute lower UTI 09/21/2017   TIA (transient ischemic attack) 08/25/2014   Aphasia 08/25/2014   Chronic constipation 05/19/2014   Fracture of multiple pubic rami (HCC) 04/11/2014   Pubic bone fracture (HCC) 04/11/2014   Left hip pain 04/11/2014   Atrial fibrillation with RVR (HCC) 03/28/2013   Diarrhea 03/28/2013   Hypothyroidism 03/24/2013   Syncope 03/24/2013   Glaucoma 03/24/2013   Hypokalemia 03/24/2013   Acute gastroenteritis 03/24/2013    Past Surgical History:  Procedure Laterality Date   CATARACT EXTRACTION, BILATERAL     COLONOSCOPY     HIP ARTHROPLASTY Right 09/22/2017   Procedure: ARTHROPLASTY BIPOLAR HIP (HEMIARTHROPLASTY);  Surgeon: Bjorn Pippin, MD;  Location: [facility] OR;  Service: Orthopedics;  Laterality: Right;   JOINT REPLACEMENT  11/20/2011   LTKR   ORIF FEMUR FRACTURE Right 12/20/2017   Procedure: OPEN REDUCTION INTERNAL FIXATION (ORIF) DISTAL FEMUR FRACTURE;  Surgeon: Kathryne Hitch, MD;  Location: [facility] OR;  Service: Orthopedics;  Laterality: Right;   REVERSE SHOULDER ARTHROPLASTY Right 10/09/2017   REVERSE SHOULDER ARTHROPLASTY Right 10/09/2017   Procedure: REVERSE SHOULDER ARTHROPLASTY;  Surgeon: Bjorn Pippin, MD;  Location: [facility] OR;  Service: Orthopedics;  Laterality: Right;     OB History   No obstetric history on file.     Family History  Problem Relation Age of Onset  Osteoporosis Mother    Cancer - Colon Father    Schizophrenia Son     Social History   Tobacco Use   Smoking status: Never Smoker   Smokeless tobacco: Never Used  Vaping Use   Vaping Use: Never used  Substance Use Topics   Alcohol use: No   Drug use: No    Home Medications Prior to Admission medications   Medication Sig Start Date End Date Taking? Authorizing Provider  acetaminophen (TYLENOL)  500 MG tablet Take 500 mg by mouth in the morning and at bedtime.   Yes [provider]  aspirin EC 81 MG tablet Take 81 mg by mouth daily.   Yes [provider]  brimonidine (ALPHAGAN P) 0.1 % SOLN Place 1 drop into the left eye in the morning and at bedtime.   Yes [provider]  brinzolamide (AZOPT) 1 % ophthalmic suspension Place 1 drop into both eyes in the morning and at bedtime.   Yes [provider]  cephALEXin (KEFLEX) 500 MG capsule Take 1 capsule (500 mg total) by mouth 4 (four) times daily. 01/20/20  Yes Fredia Sorrow, MD  docusate sodium (COLACE) 100 MG capsule Take 100 mg by mouth daily.   Yes [provider]  furosemide (LASIX) 20 MG tablet Take 1 tablet (20 mg total) by mouth daily as needed for edema. Patient taking differently: Take 20 mg by mouth 2 (two) times daily. 11/30/19  Yes Regalado, Belkys A, MD  levothyroxine (SYNTHROID) 75 MCG tablet Take 75 mcg by mouth daily before breakfast.   Yes [provider]  magnesium oxide (MAG-OX) 400 (241.3 Mg) MG tablet Take 1 tablet (400 mg total) by mouth 2 (two) times daily. 11/30/19  Yes Regalado, Belkys A, MD  metoprolol succinate (TOPROL-XL) 25 MG 24 hr tablet Take 12.5 mg by mouth daily.   Yes [provider]  potassium chloride (KLOR-CON) 10 MEQ tablet Take 10 mEq by mouth daily. 12/29/19  Yes [provider]  timolol (TIMOPTIC) 0.25 % ophthalmic solution Place 1 drop into both eyes 2 (two) times daily. 12/22/19  Yes [provider]  vitamin B-12 100 MCG tablet Take 1 tablet (100 mcg total) by mouth daily. 11/30/19  Yes Regalado, Belkys A, MD    Allergies    Aspirin, Epinephrine, Penicillins, and Codeine  Review of Systems   Review of Systems  Unable to perform ROS: Mental status change  Psychiatric/Behavioral: Positive for confusion.    Physical Exam Updated Vital Signs BP (!) 122/91 (BP Location: Right Arm)    Pulse 100    Temp 97.6 F (36.4  C) (Oral)    Resp 18    SpO2 92%   Physical Exam Vitals and nursing note reviewed.  Constitutional:      General: She is not in acute distress.    Appearance: Normal appearance. She is well-developed and well-nourished.  HENT:     Head: Normocephalic and atraumatic.  Eyes:     Extraocular Movements: Extraocular movements intact.     Conjunctiva/sclera: Conjunctivae normal.     Pupils: Pupils are equal, round, and reactive to light.  Cardiovascular:     Rate and Rhythm: Normal rate and regular rhythm.     Heart sounds: No murmur heard.   Pulmonary:     Effort: Pulmonary effort is normal. No respiratory distress.     Breath sounds: Normal breath sounds.  Abdominal:     Palpations: Abdomen is soft.     Tenderness: There is no  abdominal tenderness.  Musculoskeletal:        General: Normal range of motion.     Cervical back: Normal range of motion and neck supple.     Right lower leg: Edema present.     Left lower leg: Edema present.  Skin:    General: Skin is warm and dry.     Capillary Refill: Capillary refill takes less than 2 seconds.  Neurological:     General: No focal deficit present.     Mental Status: She is alert. She is disoriented.     Comments: Patient alert may be somewhat agitated.  Definitely does appear to be confused.  Patient certainly active able to get up and move about.  Moving all extremities.  Patient wants to go back to facility.  Psychiatric:        Mood and Affect: Mood and affect normal.     ED Results / Procedures / Treatments   Labs (all labs ordered are listed, but only abnormal results are displayed) Labs Reviewed  CBC WITH DIFFERENTIAL/PLATELET - Abnormal; Notable for the following components:      Result Value   Hemoglobin 11.9 (*)    RDW 16.2 (*)    All other components within normal limits  COMPREHENSIVE METABOLIC PANEL - Abnormal; Notable for the following components:   Glucose, Bld 101 (*)    Calcium 8.8 (*)    Albumin 3.3 (*)     AST 12 (*)    All other components within normal limits  URINALYSIS, ROUTINE W REFLEX MICROSCOPIC - Abnormal; Notable for the following components:   APPearance CLOUDY (*)    Hgb urine dipstick MODERATE (*)    Nitrite POSITIVE (*)    Leukocytes,Ua LARGE (*)    WBC, UA >50 (*)    Bacteria, UA RARE (*)    All other components within normal limits  URINE CULTURE  CBG MONITORING, ED    EKG None  Radiology DG Chest Port 1 View  Result Date: 01/20/2020 CLINICAL DATA:  Complains of weakness EXAM: PORTABLE CHEST 1 VIEW COMPARISON:  01/12/2020 FINDINGS: No focal consolidation. No pleural effusion or pneumothorax. Heart and mediastinal contours are unremarkable. No acute osseous abnormality. Right shoulder total reverse arthroplasty. IMPRESSION: No active disease. Electronically Signed   By: Kathreen Devoid   On: 01/20/2020 10:40    Procedures Procedures (including critical care time)  Medications Ordered in ED Medications  0.9 %  sodium chloride infusion ( Intravenous Stopped 01/20/20 1256)  cephALEXin (KEFLEX) capsule 500 mg (has no administration in time range)  LORazepam (ATIVAN) tablet 1 mg (1 mg Oral Given 01/20/20 1340)    ED Course  I have reviewed the triage vital signs and the nursing notes.  Pertinent labs & imaging results that were available during my care of the patient were reviewed by me and considered in my medical decision making (see chart for details).    MDM Rules/Calculators/A&P                          Work-up here significant for abnormal urine positive nitrite and leukocytes.  Most likely consistent with urinary tract infection.  Will send urine for culture.  Started on Keflex here continue Keflex at nursing facility would expect improvement over the next 2 days.  Chest x-ray negative other labs without significant abnormalities.  Originally ordered CT head was in the urine came back abnormal so it this is probably the cause.  There was  no fall or injury.  Does  not seem to be any focal neuro deficit.    Final Clinical Impression(s) / ED Diagnoses Final diagnoses:  Acute cystitis without hematuria  Confusion    Rx / DC Orders ED Discharge Orders         Ordered    cephALEXin (KEFLEX) 500 MG capsule  4 times daily        01/20/20 1423           Fredia Sorrow, MD 01/20/20 1431    Fredia Sorrow, MD 01/20/20 1431    Fredia Sorrow, MD 01/20/20 [phone]

## 2020-01-23 LAB — URINE CULTURE: Culture: >=100000 — AB

## 2020-01-24 ENCOUNTER — Telehealth (HOSPITAL_BASED_OUTPATIENT_CLINIC_OR_DEPARTMENT_OTHER): Payer: Self-pay | Admitting: Emergency Medicine

## 2020-01-24 NOTE — Telephone Encounter (Signed)
Post ED Visit - Positive Culture Follow-up  Culture report reviewed by antimicrobial stewardship pharmacist: [facility] []  , Pharm.D. []  Enzo Bi, Pharm.D., BCPS AQ-ID []  , Pharm.D., BCPS []  Celedonio Miyamoto, Pharm.D., BCPS []  [place], Garvin Fila.D., BCPS, AAHIVP []  , Pharm.D., BCPS, AAHIVP []  Georgina Pillion, PharmD, BCPS []  , PharmD, BCPS []  [place], PharmD, BCPS []  [place], PharmD []  , PharmD, BCPS []  Estella Husk, PharmD  Pharmacy Team []  Lysle Pearl, PharmD []  , PharmD []  Phillips Climes, PharmD []  , Rph []  Agapito Games) , PharmD [x]  Verlan Friends, PharmD []  , PharmD []  Mervyn Gay, PharmD []  , PharmD []  Vinnie Level, PharmD []  Wonda Olds, PharmD []  , PharmD []  Len Childs, PharmD   Positive urine culture Treated with Cephalexin, organism sensitive to the same and no further patient follow-up is required at this time.  Efren Kross 01/24/2020, 4:45 PM

## 2020-01-28 IMAGING — DX DG HIP (WITH OR WITHOUT PELVIS) 2-3V*R*
3 series · 3 of 3 positions shown · non-contrast
Comparison: Right hip x-rays dated January 23, 2014.

CLINICAL DATA: Right hip pain after fall.

EXAM:
DG HIP (WITH OR WITHOUT PELVIS) 2-3V RIGHT

[pelvis ap]
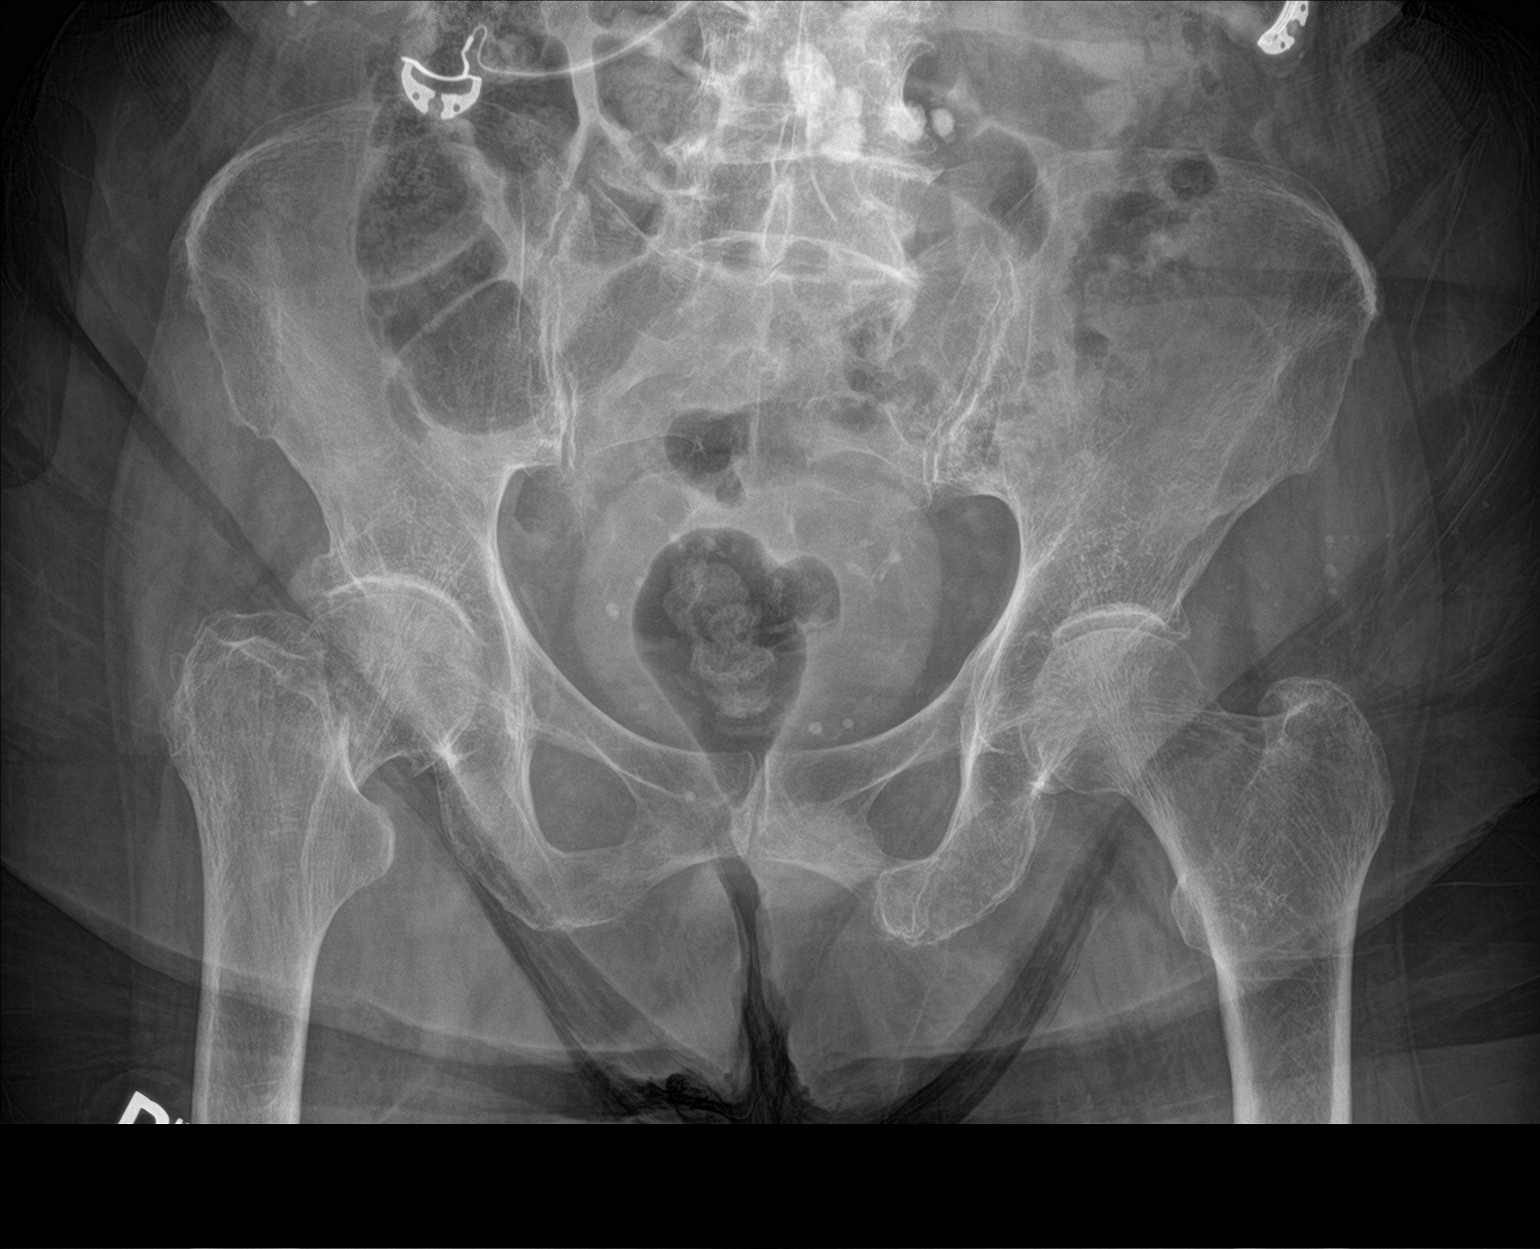

[hip lat]
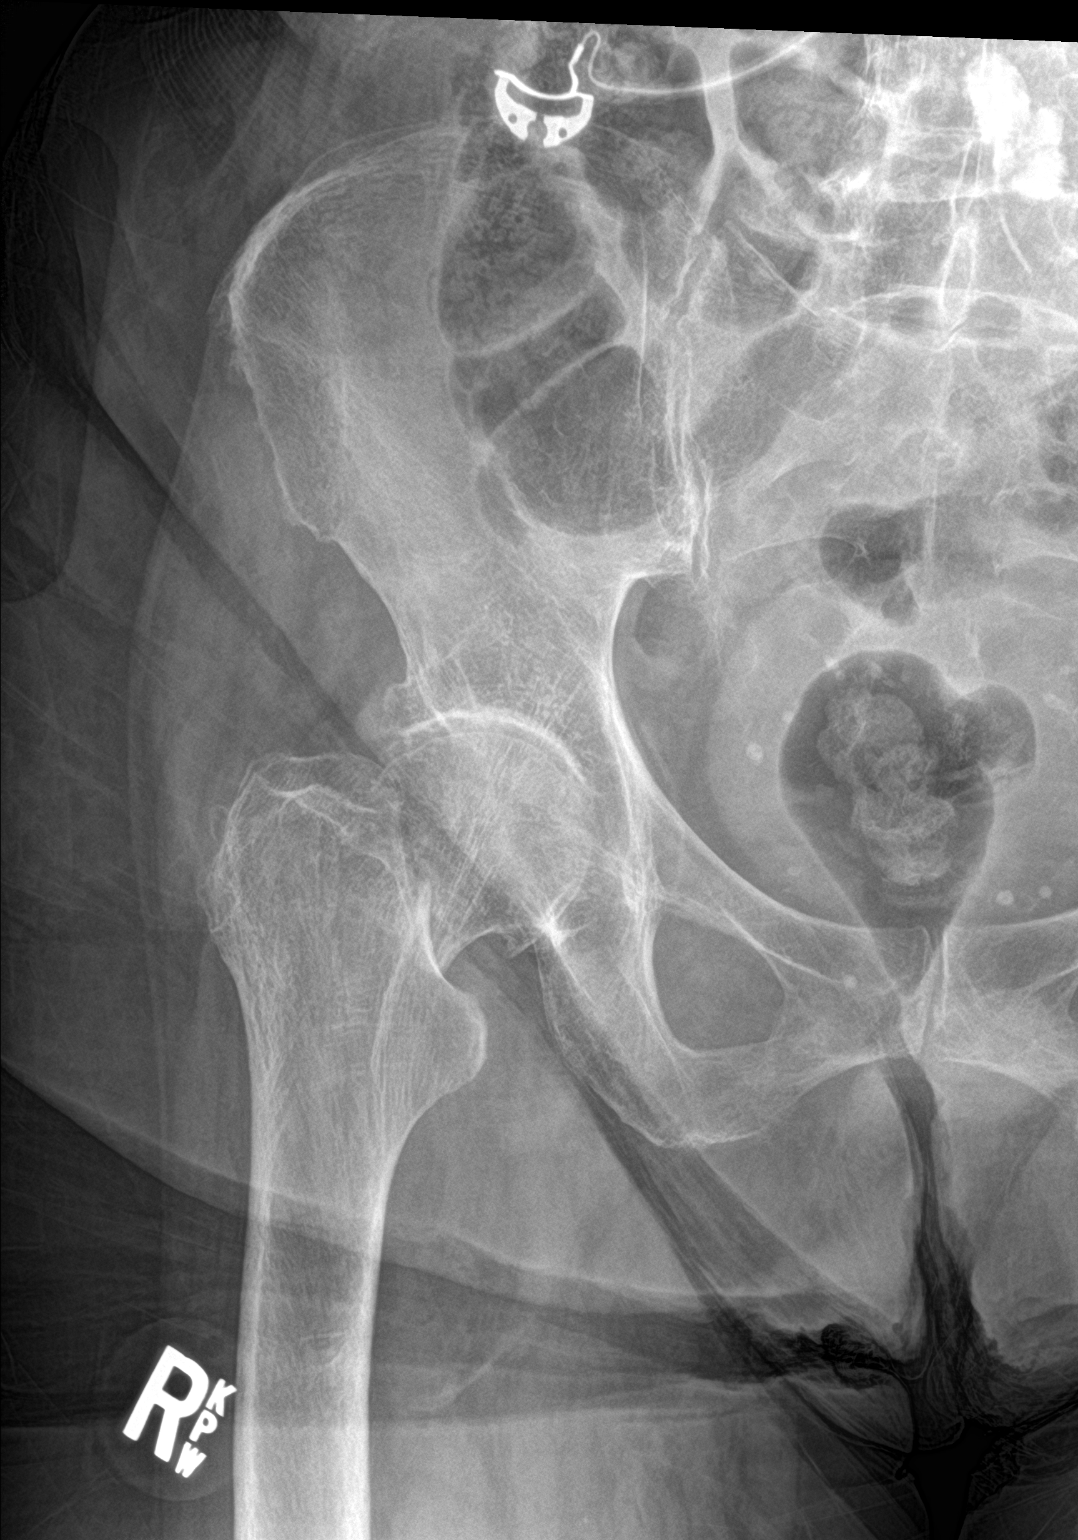

[hip ap]
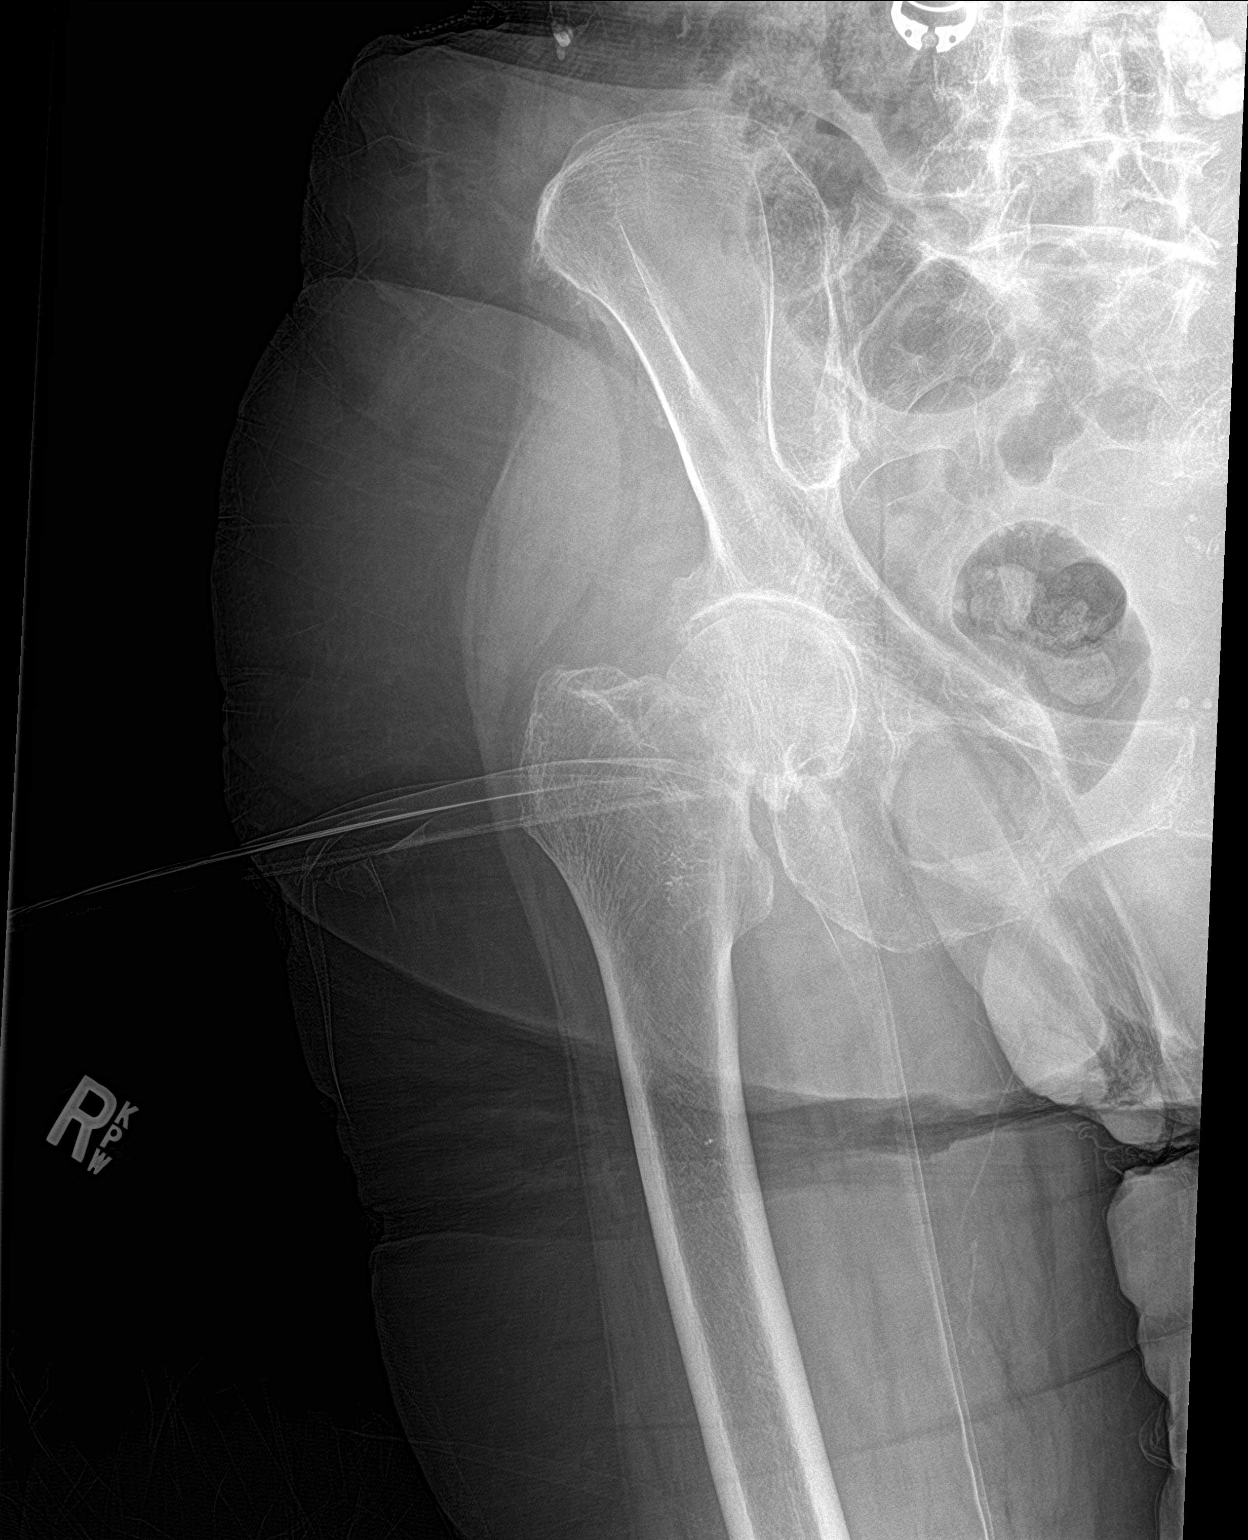

[3 of 3 positions shown; findings below may reference images not displayed]

FINDINGS: Acute mildly displaced fracture of the right femoral neck. No
dislocation. Mild right hip osteoarthritis. Osteopenia.
IMPRESSION: Acute right femoral neck fracture.

## 2020-01-29 IMAGING — DX DG PORTABLE PELVIS
2 series · 2 of 2 positions shown · non-contrast
Comparison: MRI 04/13/2014, lumbar spine radiographs 01/23/2014

CLINICAL DATA: Postop right hip arthroplasty.

EXAM:
PORTABLE PELVIS 1-2 VIEWS

[pelvis ap]
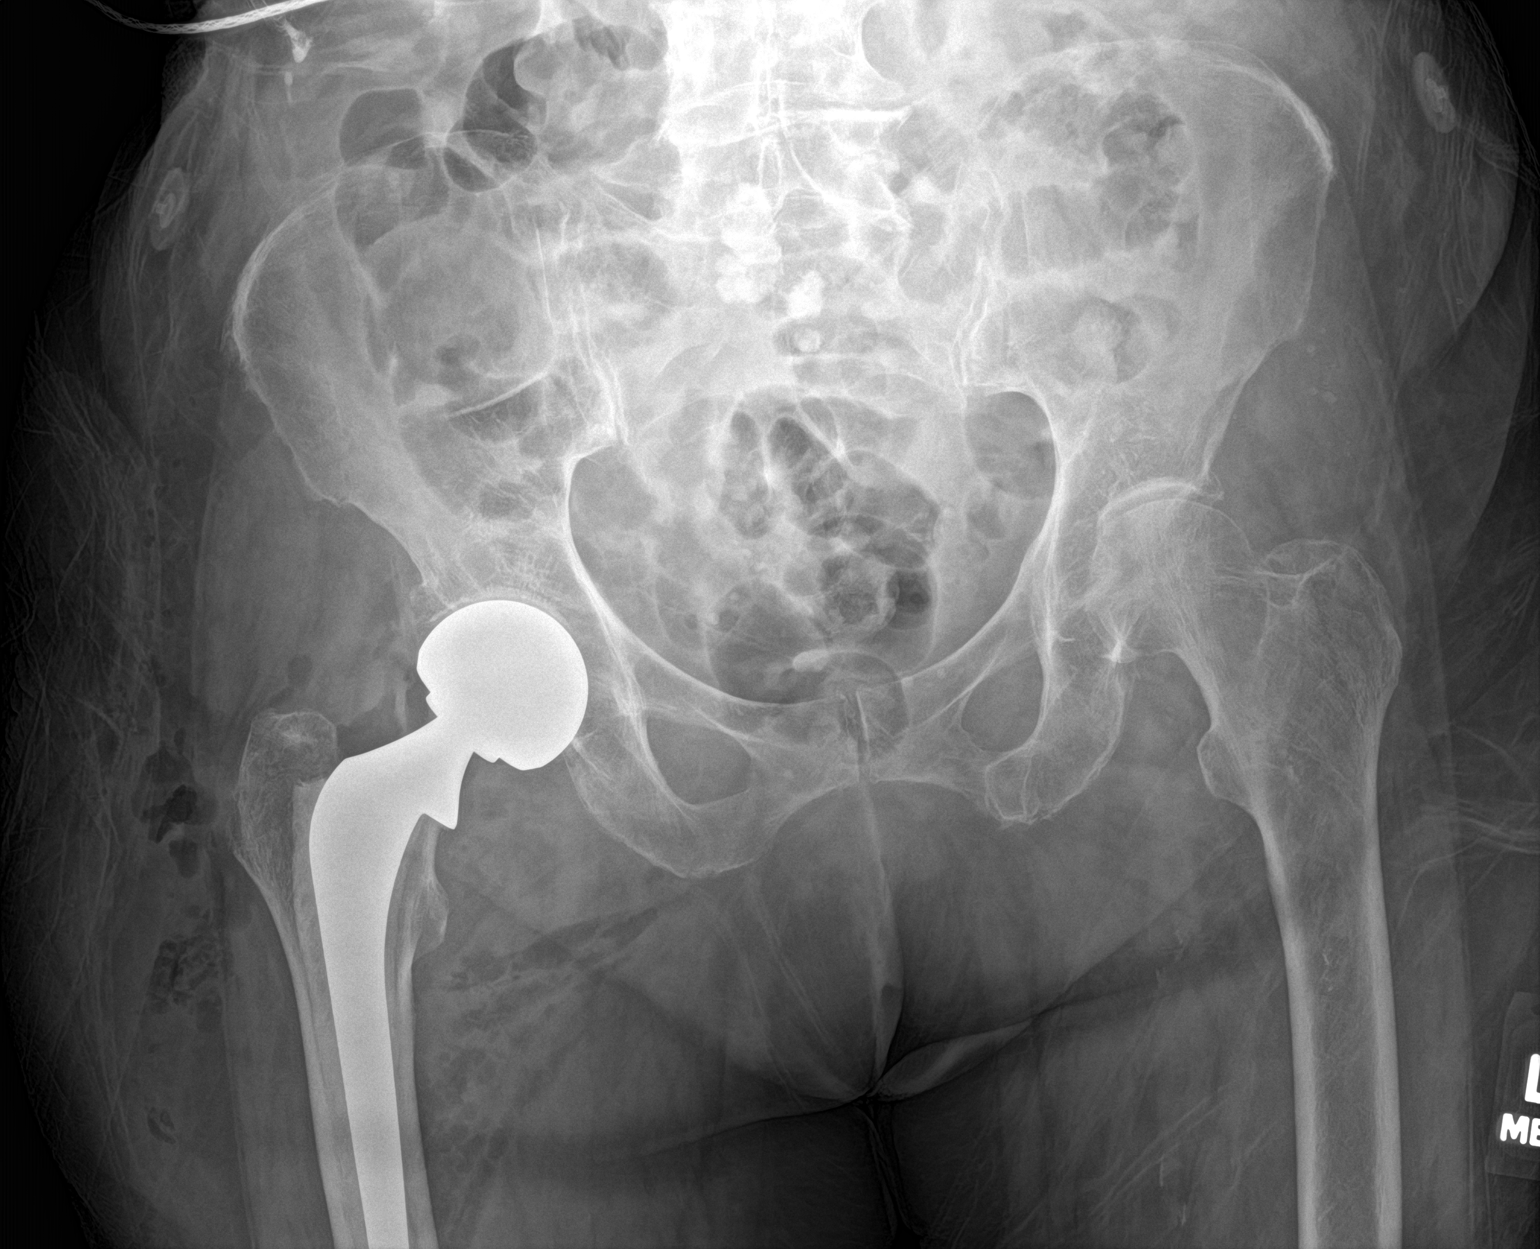

[hip ap]
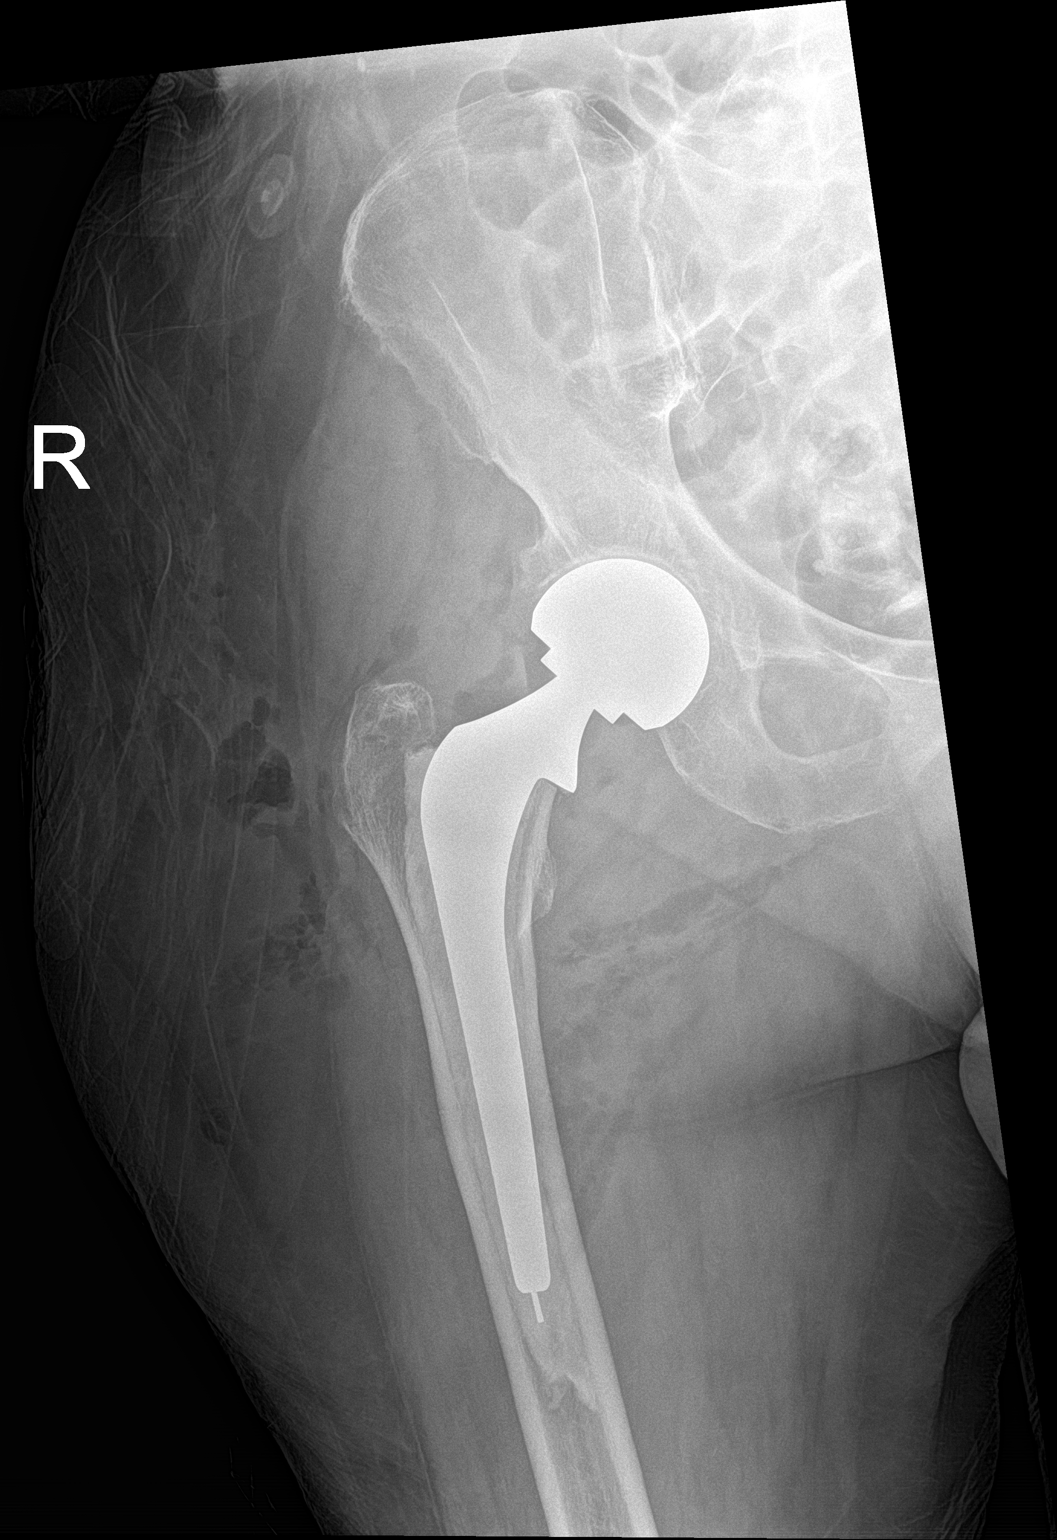

[2 of 2 positions shown; findings below may reference images not displayed]

FINDINGS: New cemented right bipolar hip arthroplasty without immediate
postoperative complication nor hardware failure is noted. Soft
tissue emphysema, expected from recent surgery is identified. Remote
left inferior pubic ramus fracture is identified with healing.
Calcified densities projecting over the upper pelvis compatible with
calcified lymph nodes, stable in appearance since 6573 comparisons.
IMPRESSION: 1. No immediate postoperative complications status post cemented
right bipolar hip arthroplasty.
2. Remote left inferior pubic ramus fracture with healing is noted.
3. Calcified upper pelvic masses compatible with calcified lymph
nodes unchanged.

## 2020-06-23 ENCOUNTER — Encounter (HOSPITAL_COMMUNITY): Payer: Self-pay | Admitting: Emergency Medicine

## 2020-06-23 ENCOUNTER — Emergency Department (HOSPITAL_COMMUNITY): Payer: Medicare Other

## 2020-06-23 ENCOUNTER — Other Ambulatory Visit: Payer: Self-pay

## 2020-06-23 ENCOUNTER — Emergency Department (HOSPITAL_COMMUNITY)
Admission: EM | Admit: 2020-06-23 | Discharge: 2020-06-23 | Disposition: A | Discharge status: Other Acute Inpatient Hospital | Payer: Medicare Other | Attending: Emergency Medicine | Admitting: Emergency Medicine

## 2020-06-23 ENCOUNTER — Telehealth: Payer: Self-pay | Admitting: Orthopaedic Surgery

## 2020-06-23 DIAGNOSIS — T849XXA Unspecified complication of internal orthopedic prosthetic device, implant and graft, initial encounter: Secondary | ICD-10-CM

## 2020-06-23 DIAGNOSIS — E039 Hypothyroidism, unspecified: Secondary | ICD-10-CM | POA: Insufficient documentation

## 2020-06-23 DIAGNOSIS — I5032 Chronic diastolic (congestive) heart failure: Secondary | ICD-10-CM | POA: Insufficient documentation

## 2020-06-23 DIAGNOSIS — Z96641 Presence of right artificial hip joint: Secondary | ICD-10-CM | POA: Insufficient documentation

## 2020-06-23 DIAGNOSIS — Y793 Surgical instruments, materials and orthopedic devices (including sutures) associated with adverse incidents: Secondary | ICD-10-CM | POA: Diagnosis not present

## 2020-06-23 DIAGNOSIS — T84194A Other mechanical complication of internal fixation device of right femur, initial encounter: Secondary | ICD-10-CM | POA: Diagnosis not present

## 2020-06-23 DIAGNOSIS — Z96611 Presence of right artificial shoulder joint: Secondary | ICD-10-CM | POA: Diagnosis not present

## 2020-06-23 DIAGNOSIS — Z7982 Long term (current) use of aspirin: Secondary | ICD-10-CM | POA: Diagnosis not present

## 2020-06-23 DIAGNOSIS — Z79899 Other long term (current) drug therapy: Secondary | ICD-10-CM | POA: Diagnosis not present

## 2020-06-23 NOTE — Telephone Encounter (Signed)
Make sure they have her in the immobilizer with her leg straight.  She needs to be nonweightbearing on that side.  We should see her in the office on Monday with either myself or Artis Delay so we can figure out where to go from here in terms of this complicated issue as a relates to her leg.

## 2020-06-23 NOTE — ED Provider Notes (Signed)
[facility] DEPT Provider Note   CSN: [id number] Arrival date & time: 06/23/20  0208     History Chief Complaint  Patient presents with  . Leg Pain    Disformity     Stephanie Donovan is a 87 y.o. female.  Patient to ED from [facility] for evaluation of deformity to right knee that staff noticed tonight. The patient reports she normally mobilizes with wheelchair and does not walk. She denies recent fall or injury. She denies significant pain. No other complaint.  The history is provided by the patient and the EMS personnel. No language interpreter was used.  Leg Pain      Past Medical History:  Diagnosis Date  . Blood transfusion without reported diagnosis   . Chronic back pain   . Chronic constipation   . Dysrhythmia    Afib with RVR - 2015, Oaroxyomal Afib  . GI bleed   . Glaucoma   . Syncope 2015  . Thyroid disease   . TIA (transient ischemic attack) 2015    Patient Active Problem List   Diagnosis Date Noted  . Supracondylar fracture of right femur, closed, initial encounter ([place]) 01/01/2018  . Closed fracture of right distal femur ([place]) 12/24/2017  . Acute blood loss anemia 12/19/2017  . Malnutrition of moderate degree 12/18/2017  . Femur fracture ([place]) 12/17/2017  . Fall 12/17/2017  . HLD (hyperlipidemia) 12/17/2017  . Chronic anemia 12/17/2017  . Closed 4-part fracture of proximal humerus, right, initial encounter 10/09/2017  . Paroxysmal A-fib ([place]) 09/21/2017  . Closed displaced fracture of right femoral neck ([place]) 09/21/2017  . Closed fracture of right proximal humerus 09/21/2017  . Dehydration 09/21/2017  . Hyponatremia 09/21/2017  . Elevated CK 09/21/2017  . Thyroid nodule 09/21/2017  . Closed right hip fracture ([place]) 09/21/2017  . Chronic diastolic CHF (congestive heart failure) ([place]) 09/21/2017  . Sinus tachycardia 09/21/2017  . Acute lower UTI 09/21/2017  . TIA (transient ischemic attack) 08/25/2014  .  Aphasia 08/25/2014  . Chronic constipation 05/19/2014  . Fracture of multiple pubic rami ([place]) 04/11/2014  . Pubic bone fracture ([place]) 04/11/2014  . Left hip pain 04/11/2014  . Atrial fibrillation with RVR ([place]) 03/28/2013  . Diarrhea 03/28/2013  . Hypothyroidism 03/24/2013  . Syncope 03/24/2013  . Glaucoma 03/24/2013  . Hypokalemia 03/24/2013  . Acute gastroenteritis 03/24/2013    Past Surgical History:  Procedure Laterality Date  . CATARACT EXTRACTION, BILATERAL    . COLONOSCOPY    . HIP ARTHROPLASTY Right 09/22/2017   Procedure: ARTHROPLASTY BIPOLAR HIP (HEMIARTHROPLASTY);  Surgeon: [name], MD;  Location: [facility];  Service: Orthopedics;  Laterality: Right;  . JOINT REPLACEMENT  11/20/2011   LTKR  . ORIF FEMUR FRACTURE Right 12/20/2017   Procedure: OPEN REDUCTION INTERNAL FIXATION (ORIF) DISTAL FEMUR FRACTURE;  Surgeon: [name], MD;  Location: [facility];  Service: Orthopedics;  Laterality: Right;  . REVERSE SHOULDER ARTHROPLASTY Right 10/09/2017  . REVERSE SHOULDER ARTHROPLASTY Right 10/09/2017   Procedure: REVERSE SHOULDER ARTHROPLASTY;  Surgeon: [name], MD;  Location: [facility];  Service: Orthopedics;  Laterality: Right;     OB History   No obstetric history on file.     Family History  Problem Relation Age of Onset  . Osteoporosis Mother   . Cancer - Colon Father   . Schizophrenia Son     Social History   Tobacco Use  . Smoking status: Never Smoker  . Smokeless tobacco: Never Used  Vaping  Use  . Vaping Use: Never used  Substance Use Topics  . Alcohol use: No  . Drug use: No    Home Medications Prior to Admission medications   Medication Sig Start Date End Date Taking? Authorizing Provider  acetaminophen (TYLENOL) 500 MG tablet Take 500 mg by mouth in the morning and at bedtime.    [provider]  aspirin EC 81 MG tablet Take 81 mg by mouth daily.    [provider]  brimonidine (ALPHAGAN P) 0.1 % SOLN Place 1 drop into  the left eye in the morning and at bedtime.    [provider]  brinzolamide (AZOPT) 1 % ophthalmic suspension Place 1 drop into both eyes in the morning and at bedtime.    [provider]  cephALEXin (KEFLEX) 500 MG capsule Take 1 capsule (500 mg total) by mouth 4 (four) times daily. 01/20/20   Fredia Sorrow, MD  docusate sodium (COLACE) 100 MG capsule Take 100 mg by mouth daily.    [provider]  furosemide (LASIX) 20 MG tablet Take 1 tablet (20 mg total) by mouth daily as needed for edema. Patient taking differently: Take 20 mg by mouth 2 (two) times daily. 11/30/19   Regalado, Belkys A, MD  levothyroxine (SYNTHROID) 75 MCG tablet Take 75 mcg by mouth daily before breakfast.    [provider]  magnesium oxide (MAG-OX) 400 (241.3 Mg) MG tablet Take 1 tablet (400 mg total) by mouth 2 (two) times daily. 11/30/19   Regalado, Belkys A, MD  metoprolol succinate (TOPROL-XL) 25 MG 24 hr tablet Take 12.5 mg by mouth daily.    [provider]  potassium chloride (KLOR-CON) 10 MEQ tablet Take 10 mEq by mouth daily. 12/29/19   [provider]  timolol (TIMOPTIC) 0.25 % ophthalmic solution Place 1 drop into both eyes 2 (two) times daily. 12/22/19   [provider]  vitamin B-12 100 MCG tablet Take 1 tablet (100 mcg total) by mouth daily. 11/30/19   Regalado, Jerald Kief A, MD    Allergies    Aspirin, Epinephrine, Penicillins, and Codeine  Review of Systems   Review of Systems  Musculoskeletal:       See HPI.  Skin: Negative for wound.  Neurological: Negative for numbness.    Physical Exam Updated Vital Signs BP (!) 146/84 (BP Location: Right Arm)   Pulse 93   Temp 97.6 F (36.4 C) (Oral)   Resp 16   SpO2 95%   Physical Exam Vitals and nursing note reviewed.  Constitutional:      Appearance: She is well-developed.  Pulmonary:     Effort: Pulmonary effort is normal.  Chest:     Chest wall: No tenderness.  Abdominal:      Tenderness: There is no abdominal tenderness.  Musculoskeletal:     Cervical back: Normal range of motion.     Comments: Right knee has a lateral deformity. Nontender. Deformity consists of a palpable nonmobile hard projection. There is mild tenderness of the distal thigh. Distal pulses are intact.   Skin:    General: Skin is warm and dry.  Neurological:     General: No focal deficit present.     Mental Status: She is alert.     ED Results / Procedures / Treatments   Labs (all labs ordered are listed, but only abnormal results are displayed) Labs Reviewed - No data to display  EKG None  Radiology No results found. DG Knee 2 Views Right  Result  Date: 06/23/2020 CLINICAL DATA:  Deformity EXAM: RIGHT KNEE - 1-2 VIEW COMPARISON:  02/26/2018 FINDINGS: Partially visualized lateral fixating plate and screws. Comminuted ununited distal femoral fracture with evidence of hardware failure. There is fracture through 1 of the fixating screws. There is separation of the lateral fixating plate from the cortex of the femur with multiple dislodged screws. The femur is heterogeneous with areas of lucency and sclerosis IMPRESSION: 1. Surgical plate and fixating screws in the femur across a comminuted ununited distal femoral fracture. Hardware of failure with displacement of lateral fixating plate, screw fracture, and multiple dislodged screws. 2. Heterogeneous fragmented appearance of the distal femur with areas of sclerosis and lucency, some concern raised for infection. Electronically Signed   By: Donavan Foil M.D.   On: 06/23/2020 03:26   DG Femur Min 2 Views Right  Result Date: 06/23/2020 CLINICAL DATA:  Deformity EXAM: RIGHT FEMUR 2 VIEWS COMPARISON:  02/26/2018 FINDINGS: Right hip hemiarthroplasty. Patient is status post surgical plate and multiple screw fixation of the mid to distal femur. This bridges comminuted ununited distal femoral fracture. Evidence of hardware failure with diffuse lucency  between the cortex of the femur and the fixating plate. There is fracture through 1 of the distal fixating screws as well as dislodgement of the head of an additional screw from the fixating plate. Large gap between the distal fixating plate and the underlying femoral cortex, now with angular deformity at the knee. IMPRESSION: 1. Surgical plate and screw fixation of the mid to distal femur across a comminuted ununited distal femoral fracture. Interval hardware failure with displacement of the lateral fixating plate from the cortex of the femur in addition to screw fracture and dislodgement of multiple fixating screws. The fixating plate approaches the skin surface of the knee. Electronically Signed   By: Donavan Foil M.D.   On: 06/23/2020 03:23    Procedures Procedures   Medications Ordered in ED Medications - No data to display  ED Course  I have reviewed the triage vital signs and the nursing notes.  Pertinent labs & imaging results that were available during my care of the patient were reviewed by me and considered in my medical decision making (see chart for details).    MDM Rules/Calculators/A&P                          Patient to ED for evaluation of right knee deformity without known injury or fall, first noticed by SNF staff tonight. The patient has no complaints.   The surgical hardward previously placed (2020) appears failed with displacement of the lateal fixating plate, screw fracture and multiple dislodged screws. The femur fracture appears as a comminuted ununited fx.   Findings were discussed with Dr. Lorin Mercy (on-call for [facility]). The patient is non-ambulatory and mobilizes with a wheelchair. It is felt appropriate for her to follow up in the office with Dr. Ninfa Linden.   Final Clinical Impression(s) / ED Diagnoses Final diagnoses:  None   1. Fixating hardware failure, right femur  Rx / DC Orders ED Discharge Orders    None       Charlann Lange, PA-C 06/23/20  0352    Orpah Greek, MD 06/23/20 [phone]

## 2020-06-23 NOTE — Telephone Encounter (Signed)
Called patient, no answer, voicemail not set up

## 2020-06-23 NOTE — Discharge Instructions (Addendum)
The deformity of the right knee is due to broken fixation hardware in the distal femur. This was discussed with Dr. Lorin Mercy (orthopedics) and can be managed in the office setting.   Please call to schedule an appointment with Dr. Ninfa Linden, who placed the hardware in 2020.

## 2020-06-23 NOTE — ED Notes (Signed)
Patient is resting comfortably. 

## 2020-06-23 NOTE — ED Notes (Signed)
[facility] has been notified

## 2020-06-23 NOTE — ED Triage Notes (Signed)
Patient is from [place] and arrived by EMS. Complaints of pain in the right leg looks to have some disformity. Patient is normally in a wheelchair.   BP: 168/100- Hr: 84- RR20- 98% RA

## 2020-06-23 NOTE — Telephone Encounter (Signed)
Pt was seen in ER the surgical hardward previously placed (2020) appears failed with displacement of the lateal fixating plate, screw fracture and multiple dislodged screws. The femur fracture appears as a comminuted ununited fx. She was told to follow up  with Dr.Blackman asap but I don't see anything available this week. Anyway he could work her in? I can call her and let her know!! CB [phone]

## 2020-06-24 NOTE — Telephone Encounter (Signed)
LMOM for patient telling her to call back so we can work her in Monday

## 2020-06-24 NOTE — Telephone Encounter (Signed)
Called again, facility states she is "resting" and to call back later

## 2020-07-05 ENCOUNTER — Ambulatory Visit (INDEPENDENT_AMBULATORY_CARE_PROVIDER_SITE_OTHER): Payer: Medicare Other | Admitting: Orthopaedic Surgery

## 2020-07-05 ENCOUNTER — Encounter: Payer: Self-pay | Admitting: Orthopaedic Surgery

## 2020-07-05 DIAGNOSIS — S72401K Unspecified fracture of lower end of right femur, subsequent encounter for closed fracture with nonunion: Secondary | ICD-10-CM | POA: Diagnosis not present

## 2020-07-05 NOTE — Progress Notes (Signed)
Office Visit Note   Patient: Stephanie Donovan           Date of Birth: 08/16/1927           MRN: [id number] Visit Date: 07/05/2020              Requested by: No referring provider defined for this encounter. PCP: System, Provider Not In   Assessment & Plan: Visit Diagnoses:  1. 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  This would include removal of the plate and likely a distal femur replacement.  This is quite concerning given her frail bone and her comorbidities.  She unequivocally states that she would not allow [place] to perform any type of surgery on her and that is her decision.  She states that she is too old to have surgery and too frail to undergo that surgery.  I explained to her that I am concerned about the soft tissue compromise that the plate is causing with tenting of the skin and that it may eventually cause breakdown and exposure of the hardware.  She states that she understands this.  I placed her in a knee immobilizer which does take pressure off the tissue and put some padding around this.  I gave instructions that she needs to stay in his knee immobilizer at all times.  I can certainly reevaluate her in 4 weeks but no x-rays are needed.  Again I expressed my concern in detail what may eventually happen with this and she still states she understands but disagrees on any type of surgery would not consent to any type of surgery on that leg.  Follow-Up Instructions: Return in about 4 weeks (around 08/02/2020).   Orders:  No orders of the defined types were placed in this encounter.  No orders of the defined types were placed in this encounter.     Procedures: No procedures performed   Clinical Data: No additional findings.   Subjective: Chief Complaint  Patient presents with   Right Knee -  Follow-up  The patient is a 87 year old female that we performed surgery on in February 2020 after she had a mechanical fall and sustained a highly comminuted distal femur fracture.  This was a periarticular type of fracture near the knee.  She had a previous hip replacement done elsewhere that was cemented.  We had to place a long lateral plate with screws and bone grafting.  Her bone was very soft and osteopenic.  We had strict weightbearing restrictions and is here in follow-up and eventually she did not return.  She stays in a nursing care facility.  Recently she felt a pop in her leg and was seen in the emergency room.  There was obvious failure of her fixation with likely nonunion of very osteopenic bone of the distal femur.  She was appropriate given follow-up with orthopedics.  She is in a wheelchair today and reports some pain but not a lot with her right lower extremity.  She answers all questions appropriate and appears of sound mind.  She also follows commands appropriately  HPI  Review of Systems Today she denies any headache, chest pain, shortness of breath, fever, chills, nausea, vomiting  Objective: Vital Signs: There were no vitals taken for this visit.  Physical Exam She is alert and oriented and follows commands  appropriately and does not appear in any acute distress Ortho Exam Examination of her right lower extremity shows tenting of the skin from the lateral plate.  There is some instability of the fracture but surprisingly she tolerates moving her leg around well. Specialty Comments:  No specialty comments available.  Imaging: No results found. Several views of the femur from month ago show obvious failure fixation with osteopenic bone and collapse of the old distal femur fracture.  PMFS History: Patient Active Problem List   Diagnosis Date Noted   Supracondylar fracture of right femur, closed, initial encounter ([place]) 01/01/2018   Closed fracture of right distal femur  ([place]) 12/24/2017   Acute blood loss anemia 12/19/2017   Malnutrition of moderate degree 12/18/2017   Femur fracture ([place]) 12/17/2017   Fall 12/17/2017   HLD (hyperlipidemia) 12/17/2017   Chronic anemia 12/17/2017   Closed 4-part fracture of proximal humerus, right, initial encounter 10/09/2017   Paroxysmal A-fib ([place]) 09/21/2017   Closed displaced fracture of right femoral neck ([place]) 09/21/2017   Closed fracture of right proximal humerus 09/21/2017   Dehydration 09/21/2017   Hyponatremia 09/21/2017   Elevated CK 09/21/2017   Thyroid nodule 09/21/2017   Closed right hip fracture ([place]) 09/21/2017   Chronic diastolic CHF (congestive heart failure) ([place]) 09/21/2017   Sinus tachycardia 09/21/2017   Acute lower UTI 09/21/2017   TIA (transient ischemic attack) 08/25/2014   Aphasia 08/25/2014   Chronic constipation 05/19/2014   Fracture of multiple pubic rami ([place]) 04/11/2014   Pubic bone fracture ([place]) 04/11/2014   Left hip pain 04/11/2014   Atrial fibrillation with RVR ([place]) 03/28/2013   Diarrhea 03/28/2013   Hypothyroidism 03/24/2013   Syncope 03/24/2013   Glaucoma 03/24/2013   Hypokalemia 03/24/2013   Acute gastroenteritis 03/24/2013   Past Medical History:  Diagnosis Date   Blood transfusion without reported diagnosis    Chronic back pain    Chronic constipation    Dysrhythmia    Afib with RVR - 2015, Oaroxyomal Afib   GI bleed    Glaucoma    Syncope 2015   Thyroid disease    TIA (transient ischemic attack) 2015    Family History  Problem Relation Age of Onset   Osteoporosis Mother    Cancer - Colon Father    Schizophrenia Son     Past Surgical History:  Procedure Laterality Date   CATARACT EXTRACTION, BILATERAL     COLONOSCOPY     HIP ARTHROPLASTY Right 09/22/2017   Procedure: ARTHROPLASTY BIPOLAR HIP (HEMIARTHROPLASTY);  Surgeon: [name], MD;  Location: [facility];  Service: Orthopedics;  Laterality: Right;   JOINT REPLACEMENT  11/20/2011   LTKR   ORIF FEMUR  FRACTURE Right 12/20/2017   Procedure: OPEN REDUCTION INTERNAL FIXATION (ORIF) DISTAL FEMUR FRACTURE;  Surgeon: [name], MD;  Location: [facility];  Service: Orthopedics;  Laterality: Right;   REVERSE SHOULDER ARTHROPLASTY Right 10/09/2017   REVERSE SHOULDER ARTHROPLASTY Right 10/09/2017   Procedure: REVERSE SHOULDER ARTHROPLASTY;  Surgeon: [name], MD;  Location: [facility];  Service: Orthopedics;  Laterality: Right;   Social History   Occupational History   Not on file  Tobacco Use   Smoking status: Never   Smokeless tobacco: Never  Vaping Use   Vaping Use: Never used  Substance and Sexual Activity   Alcohol use: No   Drug use: No   Sexual activity: Not Currently    Birth control/protection: Post-menopausal

## 2020-07-12 ENCOUNTER — Encounter: Payer: Self-pay | Admitting: Orthopaedic Surgery

## 2020-07-12 ENCOUNTER — Other Ambulatory Visit: Payer: Self-pay

## 2020-07-12 ENCOUNTER — Ambulatory Visit (INDEPENDENT_AMBULATORY_CARE_PROVIDER_SITE_OTHER): Payer: Medicare Other | Admitting: Orthopaedic Surgery

## 2020-07-12 DIAGNOSIS — S72401K Unspecified fracture of lower end of right femur, subsequent encounter for closed fracture with nonunion: Secondary | ICD-10-CM

## 2020-07-12 NOTE — Progress Notes (Signed)
The patient comes in for further evaluation treatment of a right distal femur fracture nonunion.  She is 87 years old.  Her daughter is with her today.  The biggest concern is the quality of her bone on the right side.  I did go over the with her at the last visit.  I showed her daughter the x-rays today of the nonunion with failure fixation of her right distal femur.  The patient denies any pain at all and is nonambulatory and basically gets around in her wheelchair and gets her self to the dining hall of her assisted living facility in a wheelchair that she self propels.  Of bigger concern is the fact that the distal femur plate is causing tenting of the skin.  On exam today, there is still no soft tissue compromise or evidence that this is breaking the skin.  The daughter's concern is that if it does break through the skin, the facility will not want her to be there anymore.  I did go over the x-rays with the daughter and educated her as to what is going on with her mom's distal femur.  Once surgical treatment option would be considering a distal femur replacement with hardware removal.  This is certainly quite an undertaking and is something I have never done nor am I comfortable performing.  Also, the patient absolutely refuses any type of surgery.  Her daughter said that she has some occasional forgetfulness and would not want to put her through any anesthesia.  I did fill out notes for the skilled nursing facility to continue to intermittently use a knee immobilizer to keep the knee straight to take pressure off the soft tissues on the right side.  She is to still remain nonweightbearing on the right lower extremity.  They would at least like a follow-up with my partner Dr.Xu to get his opinion about distal femur replacements since he has done some of these types of surgery.  We will work on getting her an appointment with him in the next 3 to 4 weeks.  Obviously if there are issues before then they will let  us know.

## 2020-07-22 ENCOUNTER — Inpatient Hospital Stay (HOSPITAL_COMMUNITY)
Admission: EM | Admit: 2020-07-22 | Discharge: 2020-07-25 | DRG: 312 | Disposition: A | Discharge status: Nursing Home (Includes ICF) | Payer: Medicare Other | Attending: Internal Medicine | Admitting: Internal Medicine

## 2020-07-22 ENCOUNTER — Emergency Department (HOSPITAL_COMMUNITY): Payer: Medicare Other

## 2020-07-22 ENCOUNTER — Other Ambulatory Visit: Payer: Self-pay

## 2020-07-22 ENCOUNTER — Encounter (HOSPITAL_COMMUNITY): Payer: Self-pay | Admitting: Emergency Medicine

## 2020-07-22 DIAGNOSIS — F0390 Unspecified dementia without behavioral disturbance: Secondary | ICD-10-CM | POA: Diagnosis present

## 2020-07-22 DIAGNOSIS — Z96641 Presence of right artificial hip joint: Secondary | ICD-10-CM | POA: Diagnosis present

## 2020-07-22 DIAGNOSIS — E039 Hypothyroidism, unspecified: Secondary | ICD-10-CM | POA: Diagnosis present

## 2020-07-22 DIAGNOSIS — S72401D Unspecified fracture of lower end of right femur, subsequent encounter for closed fracture with routine healing: Secondary | ICD-10-CM | POA: Diagnosis not present

## 2020-07-22 DIAGNOSIS — Z66 Do not resuscitate: Secondary | ICD-10-CM | POA: Diagnosis present

## 2020-07-22 DIAGNOSIS — I5032 Chronic diastolic (congestive) heart failure: Secondary | ICD-10-CM | POA: Diagnosis present

## 2020-07-22 DIAGNOSIS — G9341 Metabolic encephalopathy: Secondary | ICD-10-CM | POA: Diagnosis present

## 2020-07-22 DIAGNOSIS — Q899 Congenital malformation, unspecified: Secondary | ICD-10-CM | POA: Diagnosis not present

## 2020-07-22 DIAGNOSIS — I10 Essential (primary) hypertension: Secondary | ICD-10-CM | POA: Diagnosis not present

## 2020-07-22 DIAGNOSIS — Z885 Allergy status to narcotic agent status: Secondary | ICD-10-CM

## 2020-07-22 DIAGNOSIS — Z886 Allergy status to analgesic agent status: Secondary | ICD-10-CM

## 2020-07-22 DIAGNOSIS — I951 Orthostatic hypotension: Secondary | ICD-10-CM | POA: Diagnosis present

## 2020-07-22 DIAGNOSIS — Z96611 Presence of right artificial shoulder joint: Secondary | ICD-10-CM | POA: Diagnosis present

## 2020-07-22 DIAGNOSIS — R55 Syncope and collapse: Secondary | ICD-10-CM | POA: Diagnosis not present

## 2020-07-22 DIAGNOSIS — Z79899 Other long term (current) drug therapy: Secondary | ICD-10-CM | POA: Diagnosis not present

## 2020-07-22 DIAGNOSIS — Z7401 Bed confinement status: Secondary | ICD-10-CM | POA: Diagnosis not present

## 2020-07-22 DIAGNOSIS — Z96652 Presence of left artificial knee joint: Secondary | ICD-10-CM | POA: Diagnosis present

## 2020-07-22 DIAGNOSIS — Z8673 Personal history of transient ischemic attack (TIA), and cerebral infarction without residual deficits: Secondary | ICD-10-CM

## 2020-07-22 DIAGNOSIS — I480 Paroxysmal atrial fibrillation: Secondary | ICD-10-CM | POA: Diagnosis not present

## 2020-07-22 DIAGNOSIS — Z880 Allergy status to penicillin: Secondary | ICD-10-CM | POA: Diagnosis not present

## 2020-07-22 DIAGNOSIS — W19XXXD Unspecified fall, subsequent encounter: Secondary | ICD-10-CM | POA: Diagnosis present

## 2020-07-22 DIAGNOSIS — H409 Unspecified glaucoma: Secondary | ICD-10-CM | POA: Diagnosis present

## 2020-07-22 DIAGNOSIS — B9620 Unspecified Escherichia coli [E. coli] as the cause of diseases classified elsewhere: Secondary | ICD-10-CM | POA: Diagnosis present

## 2020-07-22 DIAGNOSIS — Z888 Allergy status to other drugs, medicaments and biological substances status: Secondary | ICD-10-CM

## 2020-07-22 DIAGNOSIS — Z993 Dependence on wheelchair: Secondary | ICD-10-CM

## 2020-07-22 DIAGNOSIS — I110 Hypertensive heart disease with heart failure: Secondary | ICD-10-CM | POA: Diagnosis present

## 2020-07-22 DIAGNOSIS — Z79890 Hormone replacement therapy (postmenopausal): Secondary | ICD-10-CM

## 2020-07-22 DIAGNOSIS — Z20822 Contact with and (suspected) exposure to covid-19: Secondary | ICD-10-CM | POA: Diagnosis present

## 2020-07-22 DIAGNOSIS — I959 Hypotension, unspecified: Secondary | ICD-10-CM | POA: Diagnosis not present

## 2020-07-22 DIAGNOSIS — N390 Urinary tract infection, site not specified: Secondary | ICD-10-CM | POA: Diagnosis present

## 2020-07-22 DIAGNOSIS — Z7982 Long term (current) use of aspirin: Secondary | ICD-10-CM

## 2020-07-22 LAB — CBC WITH DIFFERENTIAL/PLATELET
Abs Immature Granulocytes: 0.08 10*3/uL — ABNORMAL HIGH (ref 0.00–0.07)
Basophils Absolute: 0.1 10*3/uL (ref 0.0–0.1)
Basophils Relative: 1 %
Eosinophils Absolute: 0.1 10*3/uL (ref 0.0–0.5)
Eosinophils Relative: 0 %
HCT: 33.9 % — ABNORMAL LOW (ref 36.0–46.0)
Hemoglobin: 10.6 g/dL — ABNORMAL LOW (ref 12.0–15.0)
Immature Granulocytes: 1 %
Lymphocytes Relative: 12 %
Lymphs Abs: 1.4 10*3/uL (ref 0.7–4.0)
MCH: 29.7 pg (ref 26.0–34.0)
MCHC: 31.3 g/dL (ref 30.0–36.0)
MCV: 95 fL (ref 80.0–100.0)
Monocytes Absolute: 0.8 10*3/uL (ref 0.1–1.0)
Monocytes Relative: 7 %
Neutro Abs: 9 10*3/uL — ABNORMAL HIGH (ref 1.7–7.7)
Neutrophils Relative %: 79 %
Platelets: 297 10*3/uL (ref 150–400)
RBC: 3.57 MIL/uL — ABNORMAL LOW (ref 3.87–5.11)
RDW: 14.2 % (ref 11.5–15.5)
WBC: 11.4 10*3/uL — ABNORMAL HIGH (ref 4.0–10.5)
nRBC: 0 % (ref 0.0–0.2)

## 2020-07-22 LAB — TROPONIN I (HIGH SENSITIVITY)
Troponin I (High Sensitivity): 4 ng/L (ref <18)
Troponin I (High Sensitivity): 15 ng/L (ref <18)

## 2020-07-22 LAB — URINALYSIS, ROUTINE W REFLEX MICROSCOPIC
Bilirubin Urine: NEGATIVE
Glucose, UA: NEGATIVE mg/dL
Ketones, ur: NEGATIVE mg/dL
Nitrite: NEGATIVE
Protein, ur: NEGATIVE mg/dL
Specific Gravity, Urine: 1.015 (ref 1.005–1.030)
pH: 6 (ref 5.0–8.0)

## 2020-07-22 LAB — CBC
HCT: 38.4 % (ref 36.0–46.0)
Hemoglobin: 12.2 g/dL (ref 12.0–15.0)
MCH: 29.8 pg (ref 26.0–34.0)
MCHC: 31.8 g/dL (ref 30.0–36.0)
MCV: 93.9 fL (ref 80.0–100.0)
Platelets: 283 10*3/uL (ref 150–400)
RBC: 4.09 MIL/uL (ref 3.87–5.11)
RDW: 14.3 % (ref 11.5–15.5)
WBC: 15.6 10*3/uL — ABNORMAL HIGH (ref 4.0–10.5)
nRBC: 0 % (ref 0.0–0.2)

## 2020-07-22 LAB — COMPREHENSIVE METABOLIC PANEL
ALT: 11 U/L (ref 0–44)
AST: 14 U/L — ABNORMAL LOW (ref 15–41)
Albumin: 2.8 g/dL — ABNORMAL LOW (ref 3.5–5.0)
Alkaline Phosphatase: 91 U/L (ref 38–126)
Anion gap: 8 (ref 5–15)
BUN: 13 mg/dL (ref 8–23)
CO2: 24 mmol/L (ref 22–32)
Calcium: 8.7 mg/dL — ABNORMAL LOW (ref 8.9–10.3)
Chloride: 103 mmol/L (ref 98–111)
Creatinine, Ser: 0.72 mg/dL (ref 0.44–1.00)
GFR, Estimated: >60 mL/min (ref >60)
Glucose, Bld: 144 mg/dL — ABNORMAL HIGH (ref 70–99)
Potassium: 4.3 mmol/L (ref 3.5–5.1)
Sodium: 135 mmol/L (ref 135–145)
Total Bilirubin: 0.7 mg/dL (ref 0.3–1.2)
Total Protein: 6.5 g/dL (ref 6.5–8.1)

## 2020-07-22 LAB — PROCALCITONIN: Procalcitonin: <0.1 ng/mL

## 2020-07-22 LAB — VITAMIN B12: Vitamin B-12: 3338 pg/mL — ABNORMAL HIGH (ref 180–914)

## 2020-07-22 LAB — SARS CORONAVIRUS 2 (TAT 6-24 HRS): SARS Coronavirus 2: NEGATIVE

## 2020-07-22 LAB — BRAIN NATRIURETIC PEPTIDE: B Natriuretic Peptide: 129.2 pg/mL — ABNORMAL HIGH (ref 0.0–100.0)

## 2020-07-22 LAB — TSH: TSH: 1.512 u[IU]/mL (ref 0.350–4.500)

## 2020-07-22 LAB — CREATININE, SERUM
Creatinine, Ser: 0.59 mg/dL (ref 0.44–1.00)
GFR, Estimated: >60 mL/min (ref >60)

## 2020-07-22 LAB — MAGNESIUM: Magnesium: 2 mg/dL (ref 1.7–2.4)

## 2020-07-22 MED ORDER — TIMOLOL MALEATE 0.25 % OP SOLN
1.0000 [drp] | Freq: Two times a day (BID) | OPHTHALMIC | Status: DC
  Administered 2020-07-23 – 2020-07-25 (×4): 1 [drp] via OPHTHALMIC
  Filled 2020-07-22: qty 5

## 2020-07-22 MED ORDER — POLYETHYLENE GLYCOL 3350 17 G PO PACK: 17.0000 g | PACK | Freq: Every day | ORAL | Status: DC | PRN

## 2020-07-22 MED ORDER — METOPROLOL TARTRATE 5 MG/5ML IV SOLN: 5.0000 mg | Freq: Three times a day (TID) | INTRAVENOUS | Status: DC | PRN

## 2020-07-22 MED ORDER — BRIMONIDINE TARTRATE 0.15 % OP SOLN
1.0000 [drp] | Freq: Three times a day (TID) | OPHTHALMIC | Status: DC
  Administered 2020-07-23 – 2020-07-25 (×8): 1 [drp] via OPHTHALMIC
  Filled 2020-07-22 (×2): qty 5

## 2020-07-22 MED ORDER — ACETAMINOPHEN 325 MG PO TABS
650.0000 mg | ORAL_TABLET | Freq: Four times a day (QID) | ORAL | Status: DC | PRN
  Administered 2020-07-23: 650 mg via ORAL
  Filled 2020-07-22 (×2): qty 2

## 2020-07-22 MED ORDER — SODIUM CHLORIDE 0.9 % IV SOLN
1.0000 g | INTRAVENOUS | Status: DC
  Administered 2020-07-23: 1 g via INTRAVENOUS
  Filled 2020-07-22: qty 10

## 2020-07-22 MED ORDER — SODIUM CHLORIDE 0.9 % IV SOLN
1.0000 g | Freq: Once | INTRAVENOUS | Status: AC
  Administered 2020-07-22: 1 g via INTRAVENOUS
  Filled 2020-07-22: qty 10

## 2020-07-22 MED ORDER — LACTATED RINGERS IV SOLN: INTRAVENOUS | Status: AC

## 2020-07-22 MED ORDER — ASPIRIN EC 81 MG PO TBEC
81.0000 mg | DELAYED_RELEASE_TABLET | Freq: Every day | ORAL | Status: DC
  Administered 2020-07-23 – 2020-07-25 (×3): 81 mg via ORAL
  Filled 2020-07-22 (×3): qty 1

## 2020-07-22 MED ORDER — MAGNESIUM OXIDE -MG SUPPLEMENT 400 (240 MG) MG PO TABS
400.0000 mg | ORAL_TABLET | Freq: Two times a day (BID) | ORAL | Status: DC
  Administered 2020-07-23 – 2020-07-25 (×5): 400 mg via ORAL
  Filled 2020-07-22 (×8): qty 1

## 2020-07-22 MED ORDER — HEPARIN SODIUM (PORCINE) 5000 UNIT/ML IJ SOLN
5000.0000 [IU] | Freq: Three times a day (TID) | INTRAMUSCULAR | Status: DC
  Administered 2020-07-22 – 2020-07-25 (×9): 5000 [IU] via SUBCUTANEOUS
  Filled 2020-07-22 (×10): qty 1

## 2020-07-22 MED ORDER — ACETAMINOPHEN 650 MG RE SUPP
650.0000 mg | Freq: Four times a day (QID) | RECTAL | Status: DC | PRN
  Filled 2020-07-22: qty 1

## 2020-07-22 MED ORDER — LACTATED RINGERS IV SOLN: INTRAVENOUS | Status: DC

## 2020-07-22 MED ORDER — TRAMADOL HCL 50 MG PO TABS: 50.0000 mg | ORAL_TABLET | Freq: Four times a day (QID) | ORAL | Status: DC | PRN

## 2020-07-22 MED ORDER — DOCUSATE SODIUM 100 MG PO CAPS
100.0000 mg | ORAL_CAPSULE | Freq: Every day | ORAL | Status: DC
  Administered 2020-07-23 – 2020-07-25 (×3): 100 mg via ORAL
  Filled 2020-07-22 (×3): qty 1

## 2020-07-22 MED ORDER — LEVOTHYROXINE SODIUM 50 MCG PO TABS
50.0000 ug | ORAL_TABLET | Freq: Every day | ORAL | Status: DC
  Administered 2020-07-23 – 2020-07-25 (×3): 50 ug via ORAL
  Filled 2020-07-22 (×4): qty 1

## 2020-07-22 MED ORDER — ONDANSETRON HCL 4 MG PO TABS: 4.0000 mg | ORAL_TABLET | Freq: Four times a day (QID) | ORAL | Status: DC | PRN

## 2020-07-22 MED ORDER — ONDANSETRON HCL 4 MG/2ML IJ SOLN: 4.0000 mg | Freq: Four times a day (QID) | INTRAMUSCULAR | Status: DC | PRN

## 2020-07-22 MED ORDER — METOPROLOL TARTRATE 25 MG PO TABS
25.0000 mg | ORAL_TABLET | Freq: Two times a day (BID) | ORAL | Status: DC
  Administered 2020-07-23 – 2020-07-24 (×3): 25 mg via ORAL
  Filled 2020-07-22 (×4): qty 1

## 2020-07-22 MED ORDER — VITAMIN B-12 1000 MCG PO TABS
1000.0000 ug | ORAL_TABLET | Freq: Every day | ORAL | Status: DC
  Administered 2020-07-23 – 2020-07-25 (×3): 1000 ug via ORAL
  Filled 2020-07-22 (×3): qty 1

## 2020-07-22 NOTE — ED Provider Notes (Signed)
[facility] EMERGENCY DEPARTMENT Provider Note   CSN: [id number] Arrival date & time: 07/22/20  8756     History Chief Complaint  Patient presents with   Hypotension   Shortness of Breath    Stephanie Donovan is a 87 y.o. female.  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 fib with RVR not currently on any anticoagulation who is being brought in from her assisted living facility today due to loss of consciousness.  Patient typically gets around in a wheelchair and propels herself to the dining room.  She seemed normal when she got there for breakfast this morning but then while she was eating she slumped over in her wheelchair.  When EMS arrived she was only mumbling and was pale and diaphoretic.  Initial vital signs showed an oxygen saturation of 84% on room air and a blood pressure of 70 systolic.  Patient was placed on a nonrebreather and given 300 mL bolus of fluid with improvement of blood pressure and mentation upon arrival to the emergency room.  Patient can state her name but is not able to give any further history.  She does appear to have pain in her right lower extremity with a knee immobilizer around the tib-fib area.  She has pulses in all 4 extremities.  She does deny abdominal pain and chest pain.  She denies feeling short of breath.  The history is provided by the EMS personnel and medical records. The history is limited by the absence of a caregiver and the condition of the patient.  Shortness of Breath     Past Medical History:  Diagnosis Date   Blood transfusion without reported diagnosis    Chronic back pain    Chronic constipation    Dysrhythmia    Afib with RVR - 2015, Oaroxyomal Afib   GI bleed    Glaucoma    Syncope 2015   Thyroid  disease    TIA (transient ischemic attack) 2015    Patient Active Problem List   Diagnosis Date Noted   Supracondylar fracture of right femur, closed, initial encounter ([place]) 01/01/2018   Closed fracture of right distal femur ([place]) 12/24/2017   Acute blood loss anemia 12/19/2017   Malnutrition of moderate degree 12/18/2017   Femur fracture ([place]) 12/17/2017   Fall 12/17/2017   HLD (hyperlipidemia) 12/17/2017   Chronic anemia 12/17/2017   Closed 4-part fracture of proximal humerus, right, initial encounter 10/09/2017   Paroxysmal A-fib ([place]) 09/21/2017   Closed displaced fracture of right femoral neck ([place]) 09/21/2017   Closed fracture of right proximal humerus 09/21/2017   Dehydration 09/21/2017   Hyponatremia 09/21/2017   Elevated CK 09/21/2017   Thyroid nodule 09/21/2017   Closed right hip fracture ([place]) 09/21/2017   Chronic diastolic CHF (congestive heart failure) ([place]) 09/21/2017   Sinus tachycardia 09/21/2017   Acute lower UTI 09/21/2017   TIA (transient ischemic attack) 08/25/2014   Aphasia 08/25/2014   Chronic constipation 05/19/2014   Fracture of multiple pubic rami ([place]) 04/11/2014   Pubic bone fracture ([place]) 04/11/2014   Left hip pain 04/11/2014   Atrial fibrillation with RVR ([place]) 03/28/2013   Diarrhea 03/28/2013   Hypothyroidism 03/24/2013   Syncope 03/24/2013   Glaucoma 03/24/2013   Hypokalemia 03/24/2013   Acute gastroenteritis 03/24/2013  Past Surgical History:  Procedure Laterality Date   CATARACT EXTRACTION, BILATERAL     COLONOSCOPY     HIP ARTHROPLASTY Right 09/22/2017   Procedure: ARTHROPLASTY BIPOLAR HIP (HEMIARTHROPLASTY);  Surgeon: [name], MD;  Location: [facility];  Service: Orthopedics;  Laterality: Right;   JOINT REPLACEMENT  11/20/2011   LTKR   ORIF FEMUR FRACTURE Right 12/20/2017   Procedure: OPEN REDUCTION INTERNAL FIXATION (ORIF) DISTAL FEMUR FRACTURE;  Surgeon: [name], MD;  Location: [facility];  Service: Orthopedics;   Laterality: Right;   REVERSE SHOULDER ARTHROPLASTY Right 10/09/2017   REVERSE SHOULDER ARTHROPLASTY Right 10/09/2017   Procedure: REVERSE SHOULDER ARTHROPLASTY;  Surgeon: [name], MD;  Location: [place];  Service: Orthopedics;  Laterality: Right;     OB History   No obstetric history on file.     Family History  Problem Relation Age of Onset   Osteoporosis Mother    Cancer - Colon Father    Schizophrenia Son     Social History   Tobacco Use   Smoking status: Never   Smokeless tobacco: Never  Vaping Use   Vaping Use: Never used  Substance Use Topics   Alcohol use: No   Drug use: No    Home Medications Prior to Admission medications   Medication Sig Start Date End Date Taking? Authorizing Provider  acetaminophen (TYLENOL) 500 MG tablet Take 500 mg by mouth in the morning and at bedtime.    [provider]  aspirin EC 81 MG tablet Take 81 mg by mouth daily.    [provider]  brimonidine (ALPHAGAN P) 0.1 % SOLN Place 1 drop into the left eye in the morning and at bedtime.    [provider]  brinzolamide (AZOPT) 1 % ophthalmic suspension Place 1 drop into both eyes in the morning and at bedtime.    [provider]  docusate sodium (COLACE) 100 MG capsule Take 100 mg by mouth daily.    [provider]  furosemide (LASIX) 20 MG tablet Take 1 tablet (20 mg total) by mouth daily as needed for edema. Patient taking differently: Take 20 mg by mouth daily. 11/30/19   Regalado, Belkys A, MD  levothyroxine (SYNTHROID) 50 MCG tablet Take 50 mcg by mouth daily before breakfast.    [provider]  magnesium oxide (MAG-OX) 400 (241.3 Mg) MG tablet Take 1 tablet (400 mg total) by mouth 2 (two) times daily. 11/30/19   Regalado, Belkys A, MD  metoprolol succinate (TOPROL-XL) 25 MG 24 hr tablet Take 12.5 mg by mouth daily.    [provider]  timolol (TIMOPTIC) 0.25 % ophthalmic solution Place 1 drop into both eyes 2 (two) times  daily. 12/22/19   [provider]  vitamin B-12 100 MCG tablet Take 1 tablet (100 mcg total) by mouth daily. 11/30/19   Regalado, Belkys A, MD    Allergies    Aspirin, Epinephrine, Penicillins, and Codeine  Review of Systems   Review of Systems  Unable to perform ROS: Mental status change  Respiratory:  Positive for shortness of breath.    Physical Exam Updated Vital Signs BP (!) 132/92   Pulse (!) 113   Resp 16   SpO2 92%   Physical Exam Vitals and nursing note reviewed.  Constitutional:      General: She is not in acute distress.    Appearance: She is well-developed. She is ill-appearing.  HENT:     Head: Normocephalic and atraumatic.  Mouth/Throat:     Mouth: Mucous membranes are dry.  Eyes:     Pupils: Pupils are equal, round, and reactive to light.  Cardiovascular:     Rate and Rhythm: Normal rate and regular rhythm.     Heart sounds: Normal heart sounds. No murmur heard.   No friction rub.  Pulmonary:     Effort: Pulmonary effort is normal.     Breath sounds: Normal breath sounds. No wheezing or rales.  Abdominal:     General: Bowel sounds are normal. There is no distension.     Palpations: Abdomen is soft.     Tenderness: There is no abdominal tenderness. There is no guarding or rebound.  Musculoskeletal:        General: Tenderness present. Normal range of motion.     Comments: No edema.  Knee immobilizer around the tib-fib area but deformity of the right lower extremity shortened and externally rotated.  Patient appears to have significant pain when moved in the bed  Skin:    General: Skin is warm and dry.     Findings: No rash.  Neurological:     Mental Status: She is alert.     Cranial Nerves: No cranial nerve deficit.     Comments: Oriented to person but seems slightly confused.  Noted to move bilateral arms without difficulty.  Difficulty following commands at this time.  Psychiatric:     Comments: Mumbling and appears mildly agitated     ED Results / Procedures / Treatments   Labs (all labs ordered are listed, but only abnormal results are displayed) Labs Reviewed  CBC WITH DIFFERENTIAL/PLATELET - Abnormal; Notable for the following components:      Result Value   WBC 11.4 (*)    RBC 3.57 (*)    Hemoglobin 10.6 (*)    HCT 33.9 (*)    Neutro Abs 9.0 (*)    Abs Immature Granulocytes 0.08 (*)    All other components within normal limits  COMPREHENSIVE METABOLIC PANEL - Abnormal; Notable for the following components:   Glucose, Bld 144 (*)    Calcium 8.7 (*)    Albumin 2.8 (*)    AST 14 (*)    All other components within normal limits  URINALYSIS, ROUTINE W REFLEX MICROSCOPIC  TROPONIN I (HIGH SENSITIVITY)  TROPONIN I (HIGH SENSITIVITY)    EKG EKG Interpretation  Date/Time:  Friday July 22 2020 10:13:19 EDT Ventricular Rate:  78 PR Interval:  162 QRS Duration: 92 QT Interval:  405 QTC Calculation: 462 R Axis:   46 Text Interpretation: Sinus rhythm No significant change since last tracing Confirmed by Blanchie Dessert [phone]) on 07/22/2020 11:21:52 AM  Radiology DG Chest 1 View  Result Date: 07/22/2020 CLINICAL DATA:  Syncope. EXAM: CHEST  1 VIEW COMPARISON:  January 20, 2020. FINDINGS: The heart size and mediastinal contours are within normal limits. Both lungs are clear. The visualized skeletal structures are unremarkable. IMPRESSION: No active disease. Aortic Atherosclerosis (ICD10-I70.0). Electronically Signed   By: Marijo Conception M.D.   On: 07/22/2020 11:33   DG Pelvis 1-2 Views  Result Date: 07/22/2020 CLINICAL DATA:  Status post fall. Right femur deformity. Initial encounter. EXAM: PELVIS - 1-2 VIEW COMPARISON:  Plain films right hip 09/22/2017. FINDINGS: Bones are osteopenic. No acute bony or joint abnormality is identified. Bipolar right hip hemiarthroplasty is unchanged. Calcifications projecting in the upper pelvis are also unchanged and may be calcified lymph nodes. IMPRESSION: No acute  abnormality. Osteopenia. Electronically Signed  By: Inge Rise M.D.   On: 07/22/2020 11:33   DG FEMUR, MIN 2 VIEWS RIGHT  Result Date: 07/22/2020 CLINICAL DATA:  Status post fall. Right femur deformity. Initial encounter. EXAM: RIGHT FEMUR 2 VIEWS COMPARISON:  Plain films right femur 06/23/2020 and 02/26/2018. FINDINGS: Plate and screws for fixation of a distal femur fracture are again seen. Has seen on the most recent examination, there is no bridging bone across the metaphysis of the femur. Two of the most distal screws are broken and the distal end of the plate is laterally and inferiorly displaced, new since the 2020 exam but unchanged since the most recent exam. Position and alignment are unchanged since the most recent study. No new abnormality. Osteopenia noted. IMPRESSION: No acute finding. Fracture of the distal femur seen on the most recent examination is unchanged. Fixation hardware is in place. As seen on the most recent comparison, two of the distal most screws are broken and the distal aspect of the plate is laterally and inferiorly displaced. Electronically Signed   By: Inge Rise M.D.   On: 07/22/2020 11:38    Procedures Procedures   Medications Ordered in ED Medications - No data to display  ED Course  I have reviewed the triage vital signs and the nursing notes.  Pertinent labs & imaging results that were available during my care of the patient were reviewed by me and considered in my medical decision making (see chart for details).    MDM Rules/Calculators/A&P                          Elderly female presenting today after a syncopal event at her nursing facility.  Nobody witnessed any generalized shaking she had no tongue biting and no prior history of seizures.  She was hypotensive and hypoxic when EMS arrived which improved with some IV fluids and a nonrebreather.  Here patient's blood pressure and oxygen saturation are within normal limits.  Patient was placed  on nasal cannula but nonrebreather was discontinued.  Per staff patient was normal when she went into the dining room today.  She does have a prior history of syncope and last admitted in 2021 for syncope found to be from orthostatic hypotension but no other recent visits.  Patient is denying any chest pain or shortness of breath at this time.  We will continue to monitor.  She does have a history of nonunion in her right lower femur and there is hardware there that intermittently tense her skin but patient was adamant about not wanting any further surgery and is supposed to wear a knee immobilizer to keep the skin intact.  We will wait for patient's daughter to arrive.  Labs pending.  EKG shows no significant findings since her last tracing with nonspecific ST changes.  1:15 PM Patient's femur films are unchanged and her knee knee immobilizer was replaced.  CBC with mild leukocytosis of 11,000 but hemoglobin stable at 10, CMP without acute findings.  Troponin within normal limits at 4 and chest x-ray without acute findings.  Patient's daughter is now present in the room and reports patient is still not at her baseline.  She is somewhat delirious which is not usual for her.  She does have some short-term memory issues but is otherwise able to carry on a conversation normally.  We will do a CT to ensure no other acute pathology.  Urine is still pending.  We will plan on admitting for  syncope and altered mental status for observation.  2:43 PM UA today with large leuk, 21-50 red and white cells and many bacteria different from the past and with mild leukocytosis and some delirium will treat for UTI.  MDM   Amount and/or Complexity of Data Reviewed Clinical lab tests: ordered and reviewed Tests in the radiology section of CPT: ordered and reviewed Tests in the medicine section of CPT: ordered and reviewed Independent visualization of images, tracings, or specimens: yes  Patient Progress Patient  progress: stable     Final Clinical Impression(s) / ED Diagnoses Final diagnoses:  Syncope  Deformity    Rx / DC Orders ED Discharge Orders     None        Blanchie Dessert, MD 07/22/20 1444

## 2020-07-22 NOTE — ED Triage Notes (Signed)
Patient BIB [facility] from [facility]. Patient was eating breakfast and went unresponsive for approx 3 min. Upon EMS arrival patient mumbling, SpO2 82% room air, BP was 80/60. Pt received 300cc NS via EMS pt BP up to 117 upon arrival to department.

## 2020-07-22 NOTE — Progress Notes (Signed)
Pt able to sip water without difficulty. Attempted to give meds, pt won't swallow. Crushed  meds with apple sauce but pt refuse to open mouth.pt unable to follow commands. Pt is awake but confuse, grabbing everything off. Pt keeps on saying "yes". Will monitor.

## 2020-07-22 NOTE — H&P (Signed)
TRH H&P   Patient Demographics:    Stephanie Donovan, is a 87 y.o. female  MRN: [id number]   DOB - [birth year]  Admit Date - 07/22/2020  Outpatient Primary MD for the patient is System, Provider Not In  Outpatient Specialists: Ortho Dr Ninfa Linden    Patient coming from: [facility]  Chief Complaint  Patient presents with   Hypotension   Shortness of Breath      HPI:    Stephanie Donovan  is a 87 y.o. female, with history of TIA, hypothyroidism, hypotension induced syncope in the past, paroxysmal A. fib, GI bleed, blood transfusions in the past, right femur surgery with recent malunion and dislocation of prosthesis currently not surgical candidate follows with Dr. Ninfa Linden.  Patient with above dictated history who lives at [facility] currently wheelchair and bedbound, who has history of orthostatic hypotension induced syncope in the past, apparently patient was having breakfast and passed out for about 3 minutes on the breakfast table while she was sitting, at that time her blood pressure was in mid 80s, when EMS arrived they started her on IV fluid and brought her to the ER.  In the ER she started waking up but still quite somnolent, initial blood work was unremarkable UA was suggestive of possible UTI, her blood pressures have improved as she was started on IV fluids in the ambulance, head CT was unremarkable, x-ray of the right knee did confirm recently diagnosed fracture and malunion of distal femur.  Currently patient is somnolent unable to provide any history all the history was provided by daughter sitting bedside.  She is being admitted for further syncope work-up.    Review of systems:    A full  10 point Review of Systems was done, except as stated above, all other Review of Systems were negative.   With Past History of the following :    Past Medical History:  Diagnosis Date   Blood transfusion without reported diagnosis    Chronic back pain    Chronic constipation    Dysrhythmia    Afib with RVR - 2015, Oaroxyomal Afib   GI bleed    Glaucoma    Syncope 2015   Thyroid disease    TIA (transient ischemic attack) 2015      Past Surgical History:  Procedure Laterality Date  CATARACT EXTRACTION, BILATERAL     COLONOSCOPY     HIP ARTHROPLASTY Right 09/22/2017   Procedure: ARTHROPLASTY BIPOLAR HIP (HEMIARTHROPLASTY);  Surgeon: [name], MD;  Location: [facility];  Service: Orthopedics;  Laterality: Right;   JOINT REPLACEMENT  11/20/2011   LTKR   ORIF FEMUR FRACTURE Right 12/20/2017   Procedure: OPEN REDUCTION INTERNAL FIXATION (ORIF) DISTAL FEMUR FRACTURE;  Surgeon: [name], MD;  Location: [facility];  Service: Orthopedics;  Laterality: Right;   REVERSE SHOULDER ARTHROPLASTY Right 10/09/2017   REVERSE SHOULDER ARTHROPLASTY Right 10/09/2017   Procedure: REVERSE SHOULDER ARTHROPLASTY;  Surgeon: [name], MD;  Location: [place];  Service: Orthopedics;  Laterality: Right;      Social History:     Social History   Tobacco Use   Smoking status: Never   Smokeless tobacco: Never  Substance Use Topics   Alcohol use: No         Family History :     Family History  Problem Relation Age of Onset   Osteoporosis Mother    Cancer - Colon Father    Schizophrenia Son       Home Medications:   Prior to Admission medications   Medication Sig Start Date End Date Taking? Authorizing Provider  acetaminophen (TYLENOL) 500 MG tablet Take 500 mg by mouth in the morning and at bedtime.   Yes [provider]  aspirin EC 81 MG tablet Take 81 mg by mouth daily.   Yes [provider]  brimonidine (ALPHAGAN P) 0.1 % SOLN Place 1 drop into the left  eye in the morning and at bedtime.   Yes [provider]  brinzolamide (AZOPT) 1 % ophthalmic suspension Place 1 drop into both eyes in the morning and at bedtime.   Yes [provider]  docusate sodium (COLACE) 100 MG capsule Take 100 mg by mouth daily.   Yes [provider]  furosemide (LASIX) 20 MG tablet Take 1 tablet (20 mg total) by mouth daily as needed for edema. Patient taking differently: Take 20 mg by mouth daily. 11/30/19  Yes Regalado, Belkys A, MD  levothyroxine (SYNTHROID) 50 MCG tablet Take 50 mcg by mouth daily before breakfast.   Yes [provider]  magnesium oxide (MAG-OX) 400 (241.3 Mg) MG tablet Take 1 tablet (400 mg total) by mouth 2 (two) times daily. 11/30/19  Yes Regalado, Belkys A, MD  metoprolol succinate (TOPROL-XL) 25 MG 24 hr tablet Take 12.5 mg by mouth daily.   Yes [provider]  timolol (TIMOPTIC) 0.25 % ophthalmic solution Place 1 drop into both eyes 2 (two) times daily. 12/22/19  Yes [provider]  vitamin B-12 (CYANOCOBALAMIN) 1000 MCG tablet Take 1,000 mcg by mouth daily.   Yes [provider]  vitamin B-12 100 MCG tablet Take 1 tablet (100 mcg total) by mouth daily. Patient not taking: Reported on 07/22/2020 11/30/19   Elmarie Shiley, MD     Allergies:     Allergies  Allergen Reactions   Aspirin Palpitations and Other (See Comments)    Unspecified Mixed reactions   Epinephrine Other (See Comments)    Rapped heart beat   Penicillins Other (See Comments)    Lost hearing temporarily DID THE REACTION INVOLVE: Swelling of the face/tongue/throat, SOB, or low BP? No Sudden or severe rash/hives, skin peeling, or the inside of the mouth or nose? No Did it require medical treatment? No When did it last happen?  If all above answers are "NO", may proceed with cephalosporin use.    Codeine Palpitations     Physical Exam:   Vitals  Blood pressure (!) 132/92, pulse (!) 113,  temperature 98.2 F (36.8 C), resp. rate 16, SpO2 92 %.   1. General elderly Caucasian white female lying in hospital bed somnolent but in no distress,  2.  Moves all 4 extremities to stimuli, unable to cooperate in psych exam  3.  Due to somnolence unable to follow parents reliably but moves all 4 extremities to stimuli,  4. Ears and Eyes appear Normal, Conjunctivae clear, PERRLA. Moist Oral Mucosa.  5. Supple Neck, No JVD, No cervical lymphadenopathy appriciated, No Carotid Bruits.  6. Symmetrical Chest wall movement, Good air movement bilaterally, CTAB.  7. RRR, No Gallops, Rubs or Murmurs, No Parasternal Heave.  8. Positive Bowel Sounds, Abdomen Soft, No tenderness, No organomegaly appriciated,No rebound -guarding or rigidity.  9.  No Cyanosis, Normal Skin Turgor, No Skin Rash or Bruise.  10. Good muscle tone,  joints appear normal , no effusions, Normal ROM.  Right knee has a noticeable bulge from the malunion of distal femur fracture has a overlying brace.  11. No Palpable Lymph Nodes in Neck or Axillae      Data Review:    CBC Recent Labs  Lab 07/22/20 1008  WBC 11.4*  HGB 10.6*  HCT 33.9*  PLT 297  MCV 95.0  MCH 29.7  MCHC 31.3  RDW 14.2  LYMPHSABS 1.4  MONOABS 0.8  EOSABS 0.1  BASOSABS 0.1   ------------------------------------------------------------------------------------------------------------------  Chemistries  Recent Labs  Lab 07/22/20 1008  NA 135  K 4.3  CL 103  CO2 24  GLUCOSE 144*  BUN 13  CREATININE 0.72  CALCIUM 8.7*  AST 14*  ALT 11  ALKPHOS 91  BILITOT 0.7   ------------------------------------------------------------------------------------------------------------------ CrCl cannot be calculated (Unknown ideal weight.). ------------------------------------------------------------------------------------------------------------------ No results for input(s): TSH, T4TOTAL, T3FREE, THYROIDAB in the last 72 hours.  Invalid  input(s): FREET3  Coagulation profile No results for input(s): INR, PROTIME in the last 168 hours. ------------------------------------------------------------------------------------------------------------------- No results for input(s): DDIMER in the last 72 hours. -------------------------------------------------------------------------------------------------------------------  Cardiac Enzymes No results for input(s): CKMB, TROPONINI, MYOGLOBIN in the last 168 hours.  Invalid input(s): CK ------------------------------------------------------------------------------------------------------------------    Component Value Date/Time   BNP 114.3 (H) 01/12/2020 0955     ---------------------------------------------------------------------------------------------------------------  Urinalysis    Component Value Date/Time   COLORURINE YELLOW 07/22/2020 1357   APPEARANCEUR HAZY (A) 07/22/2020 1357   LABSPEC 1.015 07/22/2020 1357   PHURINE 6.0 07/22/2020 1357   GLUCOSEU NEGATIVE 07/22/2020 1357   HGBUR SMALL (A) 07/22/2020 1357   BILIRUBINUR NEGATIVE 07/22/2020 1357   KETONESUR NEGATIVE 07/22/2020 1357   PROTEINUR NEGATIVE 07/22/2020 1357   UROBILINOGEN 0.2 08/25/2014 0454   NITRITE NEGATIVE 07/22/2020 1357   LEUKOCYTESUR LARGE (A) 07/22/2020 1357    ----------------------------------------------------------------------------------------------------------------   Imaging Results:    DG Chest 1 View  Result Date: 07/22/2020 CLINICAL DATA:  Syncope. EXAM: CHEST  1 VIEW COMPARISON:  January 20, 2020. FINDINGS: The heart size and mediastinal contours are within normal limits. Both lungs are clear. The visualized skeletal structures are unremarkable. IMPRESSION: No active disease. Aortic Atherosclerosis (ICD10-I70.0). Electronically Signed   By: Marijo Conception M.D.   On: 07/22/2020 11:33   DG Pelvis 1-2 Views  Result Date: 07/22/2020 CLINICAL DATA:  Status post fall. Right  femur deformity. Initial encounter. EXAM: PELVIS - 1-2 VIEW COMPARISON:  Plain films right hip  09/22/2017. FINDINGS: Bones are osteopenic. No acute bony or joint abnormality is identified. Bipolar right hip hemiarthroplasty is unchanged. Calcifications projecting in the upper pelvis are also unchanged and may be calcified lymph nodes. IMPRESSION: No acute abnormality. Osteopenia. Electronically Signed   By: Inge Rise M.D.   On: 07/22/2020 11:33   CT Head Wo Contrast  Result Date: 07/22/2020 CLINICAL DATA:  Syncopal episode altered mental status EXAM: CT HEAD WITHOUT CONTRAST TECHNIQUE: Contiguous axial images were obtained from the base of the skull through the vertex without intravenous contrast. COMPARISON:  CT brain 11/28/2019 FINDINGS: Brain: No acute territorial infarction, hemorrhage, or intracranial mass. Atrophy and chronic small vessel ischemic changes of the white matter. Stable ventricle size. Vascular: No hyperdense vessels.  Carotid vascular calcification Skull: Normal. Negative for fracture or focal lesion. Sinuses/Orbits: No acute finding. Other: None IMPRESSION: 1. No CT evidence for acute intracranial abnormality. 2. Atrophy and chronic small vessel ischemic change of the white matter Electronically Signed   By: Donavan Foil M.D.   On: 07/22/2020 15:16   DG FEMUR, MIN 2 VIEWS RIGHT  Result Date: 07/22/2020 CLINICAL DATA:  Status post fall. Right femur deformity. Initial encounter. EXAM: RIGHT FEMUR 2 VIEWS COMPARISON:  Plain films right femur 06/23/2020 and 02/26/2018. FINDINGS: Plate and screws for fixation of a distal femur fracture are again seen. Has seen on the most recent examination, there is no bridging bone across the metaphysis of the femur. Two of the most distal screws are broken and the distal end of the plate is laterally and inferiorly displaced, new since the 2020 exam but unchanged since the most recent exam. Position and alignment are unchanged since the most recent  study. No new abnormality. Osteopenia noted. IMPRESSION: No acute finding. Fracture of the distal femur seen on the most recent examination is unchanged. Fixation hardware is in place. As seen on the most recent comparison, two of the distal most screws are broken and the distal aspect of the plate is laterally and inferiorly displaced. Electronically Signed   By: Inge Rise M.D.   On: 07/22/2020 11:38    My personal review of EKG: Rhythm NSR, mid 80s, no acute ST changes   Assessment & Plan:    1.  Syncope most likely due to hypotension.  She had a similar episode last year as well, she is on Lasix and could have contributed to dehydration also UA suggestive of UTI, at this time she will receive IV fluids, check TSH, monitor on telemetry, repeat echocardiogram, have requested the nurse to check orthostatics while laying in bed and sitting up.  She is not allowed to bear weight on her right leg due to recent distal femur fracture and malunion.  Discussed with daughter focus will be on gentle medical treatment and avoiding heroics.  2.  UTI.  For now Rocephin, IV fluids and follow cultures.  3.  Paroxysmal A. fib.  Not on anticoagulation.  Mali vas 2 score of greater than 3.  At this time low-dose scheduled beta-blocker along with as needed IV Lopressor and monitor.  Continue home dose aspirin.  4.  Essential hypertension.  Blood pressure was low at the time of syncope, blood pressure has improved, continue hydration, low-dose beta-blocker if blood pressure tolerates.  5.  Hypothyroidism.  Continue home dose Synthroid check TSH.  6.  Right distal femur fracture and dislocation.  Follows with Dr. Ninfa Linden, currently nonweightbearing on the right leg, conservative management.  Per Dr. Ninfa Linden poor surgical candidate.  Note patient remains a candidate for delirium due to her advanced age and frail status, this was explained clearly to the daughter, if she gets confused we will use Haldol and  avoid narcotics and benzodiazepines.    DVT Prophylaxis Heparin    AM Labs Ordered, also please review Full Orders  Family Communication: Admission, patients condition and plan of care including tests being ordered have been discussed with the patient and daughter who indicate understanding and agree with the plan and Code Status.  Code Status DNR  Likely DC to  SNF  Condition GUARDED    Consults called: None    Admission status: Inpt    Time spent in minutes : 35   Lala Lund M.D on 07/22/2020 at 3:34 PM  To page go to [email or web address] - password [facility]

## 2020-07-23 ENCOUNTER — Inpatient Hospital Stay (HOSPITAL_COMMUNITY): Payer: Medicare Other

## 2020-07-23 DIAGNOSIS — R55 Syncope and collapse: Secondary | ICD-10-CM

## 2020-07-23 DIAGNOSIS — I480 Paroxysmal atrial fibrillation: Secondary | ICD-10-CM

## 2020-07-23 DIAGNOSIS — N390 Urinary tract infection, site not specified: Secondary | ICD-10-CM

## 2020-07-23 DIAGNOSIS — I5032 Chronic diastolic (congestive) heart failure: Secondary | ICD-10-CM

## 2020-07-23 DIAGNOSIS — I959 Hypotension, unspecified: Secondary | ICD-10-CM

## 2020-07-23 LAB — ECHOCARDIOGRAM COMPLETE
Area-P 1/2: 3.95 cm2
S' Lateral: 3 cm
Weight: 2130.53 oz

## 2020-07-23 LAB — COMPREHENSIVE METABOLIC PANEL
ALT: 14 U/L (ref 0–44)
AST: 24 U/L (ref 15–41)
Albumin: 2.8 g/dL — ABNORMAL LOW (ref 3.5–5.0)
Alkaline Phosphatase: 99 U/L (ref 38–126)
Anion gap: 10 (ref 5–15)
BUN: 19 mg/dL (ref 8–23)
CO2: 22 mmol/L (ref 22–32)
Calcium: 8.7 mg/dL — ABNORMAL LOW (ref 8.9–10.3)
Chloride: 101 mmol/L (ref 98–111)
Creatinine, Ser: 0.81 mg/dL (ref 0.44–1.00)
GFR, Estimated: >60 mL/min (ref >60)
Glucose, Bld: 127 mg/dL — ABNORMAL HIGH (ref 70–99)
Potassium: 4.7 mmol/L (ref 3.5–5.1)
Sodium: 133 mmol/L — ABNORMAL LOW (ref 135–145)
Total Bilirubin: 0.9 mg/dL (ref 0.3–1.2)
Total Protein: 6.6 g/dL (ref 6.5–8.1)

## 2020-07-23 LAB — CBC WITH DIFFERENTIAL/PLATELET
Abs Immature Granulocytes: 0.06 10*3/uL (ref 0.00–0.07)
Basophils Absolute: 0 10*3/uL (ref 0.0–0.1)
Basophils Relative: 0 %
Eosinophils Absolute: 0 10*3/uL (ref 0.0–0.5)
Eosinophils Relative: 0 %
HCT: 35.6 % — ABNORMAL LOW (ref 36.0–46.0)
Hemoglobin: 11.6 g/dL — ABNORMAL LOW (ref 12.0–15.0)
Immature Granulocytes: 0 %
Lymphocytes Relative: 4 %
Lymphs Abs: 0.6 10*3/uL — ABNORMAL LOW (ref 0.7–4.0)
MCH: 29.1 pg (ref 26.0–34.0)
MCHC: 32.6 g/dL (ref 30.0–36.0)
MCV: 89.2 fL (ref 80.0–100.0)
Monocytes Absolute: 0.3 10*3/uL (ref 0.1–1.0)
Monocytes Relative: 2 %
Neutro Abs: 13 10*3/uL — ABNORMAL HIGH (ref 1.7–7.7)
Neutrophils Relative %: 94 %
Platelets: 243 10*3/uL (ref 150–400)
RBC: 3.99 MIL/uL (ref 3.87–5.11)
RDW: 14.4 % (ref 11.5–15.5)
WBC: 14 10*3/uL — ABNORMAL HIGH (ref 4.0–10.5)
nRBC: 0 % (ref 0.0–0.2)

## 2020-07-23 LAB — MAGNESIUM: Magnesium: 1.9 mg/dL (ref 1.7–2.4)

## 2020-07-23 LAB — BRAIN NATRIURETIC PEPTIDE: B Natriuretic Peptide: 422 pg/mL — ABNORMAL HIGH (ref 0.0–100.0)

## 2020-07-23 LAB — PROCALCITONIN: Procalcitonin: <0.1 ng/mL

## 2020-07-23 NOTE — Progress Notes (Signed)
  Echocardiogram 2D Echocardiogram has been performed.  Stephanie Donovan 07/23/2020, 9:47 AM

## 2020-07-23 NOTE — Progress Notes (Signed)
Pt much more awake and  verbally responsive compared this morning,very good appetite. Daughter at bedside. Pts gold wristwatch given to the daughter- [place].

## 2020-07-23 NOTE — Progress Notes (Addendum)
PROGRESS NOTE        PATIENT DETAILS Name: Stephanie Donovan Age: 87 y.o. Sex: female Date of Birth: 1927/10/09 Admit Date: 07/22/2020 Admitting Physician Thurnell Lose, MD [id number], Provider Not In  Brief Narrative: Patient is a 87 y.o. female with history of TIA, hypothyroidism, PAF, hypotension induced syncope, right femur surgery with recent malunion/dislocation of prosthesis (not a surgical candidate) presented to the ED from her SNF for syncope associated due to hypotension, she was found to have UTI.  Significant events: 7/1>> admit from SNF-syncope-hypotensive at facility.  Significant studies: 7/1>> CT head: No acute intracranial abnormality. 7/1>> CXR: No pneumonia 7/1>> x-ray pelvis: No fractures 7/1>> x-ray right femur: No acute findings-fracture of distal femur seen on most recent examination is unchanged.  Fixation hardware is present.  Antimicrobial therapy: 7/1>> COVID PCR: Negative  Microbiology data: 7/1>> urine culture: Present  Procedures : None  Consults: None  DVT Prophylaxis : heparin injection 5,000 Units Start: 07/22/20 1634   Subjective: Pleasantly confused-follow-up questions but keeps on repeating yes-and asking to be moved.   Assessment/Plan: Syncope due to orthostatic hypotension: Recurrent issue-hypotension could have been triggered by use of diuretics/UTI.  Very brief episode of narrow complex tachycardia overnight-but mostly in sinus rhythm.  Await echo.  Place compression/TED stockings.  Minimize/avoid use of diuretics is much as possible-and would tolerate some amount of peripheral edema.  Acute metabolic encephalopathy: See discussion below-unclear whether patient is back to baseline-daughter acknowledges that yesterday on admission-patient was confused and not at her baseline which I suspect was from hypotension and UTI.  CT head without any acute abnormalities.  PAF: Brief episode of narrow complex  tachycardia overnight-given her advanced age-multiple medical comorbidities-agree that she is not a anticoagulation candidate.  Continue metoprolol/aspirin.  Await echo.  Chronic diastolic heart failure: Euvolemic on exam.  UTI: Hard to discern if she has symptoms-continue IV Rocephin and follow cultures.  HTN: BP stable-continue low-dose metoprolol-follow and adjust.  Hypothyroidism: TSH stable-continue levothyroxine  History of right distal femur fracture/dislocation: Follows with orthopedics-Dr. Kerby Less a candidate for surgery-she is nonweightbearing in the right leg.  She is mostly bedbound-but at times is able to transfer with assistance from bed to wheelchair.  Dementia/cognitive dysfunction: At risk for delirium-supportive care-maintain delirium precautions.  No family at bedside-but spoke with patient's daughter over the phone-she does acknowledge some amount of cognitive dysfunction-but thinks that mothers current mental status is not yet at baseline.  Discussed further imaging with MRI-I do not think it will change management outcome given that she is bedbound frail and 87 years old-I have asked the patient's daughter to come to the hospital today-and see if she is close to her back to her baseline.  I will touch base with the patient's daughter again tomorrow.  She was agreeable with this plan.  Diet: Diet Order             DIET SOFT Room service appropriate? Yes; Fluid consistency: Thin  Diet effective now                    Code Status: DNR  Family Communication: Spoke with Daughter-[place] [phone] over phone 7/2  Disposition Plan: Status is: Inpatient  Remains inpatient appropriate because:Inpatient level of care appropriate due to severity of illness  Dispo: The patient is from: SNF  Anticipated d/c is to: SNF              Patient currently is not medically stable to d/c.   Difficult to place patient No    Barriers to Discharge:  Syncope-orthostatic hypotension-UTI-on IV Rocephin.  Antimicrobial agents: Anti-infectives (From admission, onward)    Start     Dose/Rate Route Frequency Ordered Stop   07/23/20 1500  cefTRIAXone (ROCEPHIN) 1 g in sodium chloride 0.9 % 100 mL IVPB        1 g 200 mL/hr over 30 Minutes Intravenous Every 24 hours 07/22/20 1530     07/22/20 1445  cefTRIAXone (ROCEPHIN) 1 g in sodium chloride 0.9 % 100 mL IVPB        1 g 200 mL/hr over 30 Minutes Intravenous  Once 07/22/20 1444 07/22/20 1535        Time spent: 25  minutes-Greater than 50% of this time was spent in counseling, explanation of diagnosis, planning of further management, and coordination of care.  MEDICATIONS: Scheduled Meds:  aspirin EC  81 mg Oral Daily   brimonidine  1 drop Both Eyes TID   docusate sodium  100 mg Oral Daily   heparin  5,000 Units Subcutaneous Q8H   levothyroxine  50 mcg Oral QAC breakfast   magnesium oxide  400 mg Oral BID   metoprolol tartrate  25 mg Oral BID   timolol  1 drop Both Eyes BID   vitamin B-12  1,000 mcg Oral Daily   Continuous Infusions:  cefTRIAXone (ROCEPHIN)  IV     lactated ringers 75 mL/hr at 07/23/20 0717   PRN Meds:.acetaminophen **OR** acetaminophen, metoprolol tartrate, ondansetron **OR** ondansetron (ZOFRAN) IV, polyethylene glycol, traMADol   PHYSICAL EXAM: Vital signs: Vitals:   07/22/20 2029 07/22/20 2352 07/23/20 0425 07/23/20 0724  BP: (!) 147/116 (!) 148/91 (!) 143/91 138/89  Pulse: (!) 112 (!) 106 (!) 102 (!) 107  Resp: 18 18 17 18   Temp: 98.4 F (36.9 C) 97.7 F (36.5 C) 98 F (36.7 C) 98 F (36.7 C)  TempSrc: Oral Oral Oral Oral  SpO2: 95% 100% 99% 91%  Weight:   60.4 kg    Filed Weights   07/23/20 0425  Weight: 60.4 kg   Body mass index is 22.86 kg/m.   Gen Exam:Alert awake-not in any distress HEENT:atraumatic, normocephalic Chest: B/L clear to auscultation anteriorly CVS:S1S2 regular Abdomen:soft non tender, non distended Extremities:no  edema Neurology: Non focal Skin: no rash  I have personally reviewed following labs and imaging studies  LABORATORY DATA: CBC: Recent Labs  Lab 07/22/20 1008 07/22/20 1532 07/23/20 0409  WBC 11.4* 15.6* 14.0*  NEUTROABS 9.0*  --  13.0*  HGB 10.6* 12.2 11.6*  HCT 33.9* 38.4 35.6*  MCV 95.0 93.9 89.2  PLT 297 283 564    Basic Metabolic Panel: Recent Labs  Lab 07/22/20 1008 07/22/20 1501 07/22/20 1532 07/23/20 0608  NA 135  --   --  133*  K 4.3  --   --  4.7  CL 103  --   --  101  CO2 24  --   --  22  GLUCOSE 144*  --   --  127*  BUN 13  --   --  19  CREATININE 0.72  --  0.59 0.81  CALCIUM 8.7*  --   --  8.7*  MG  --  2.0  --  1.9    GFR: CrCl cannot be calculated (Unknown ideal weight.).  Liver Function Tests:  Recent Labs  Lab 07/22/20 1008 07/23/20 0608  AST 14* 24  ALT 11 14  ALKPHOS 91 99  BILITOT 0.7 0.9  PROT 6.5 6.6  ALBUMIN 2.8* 2.8*   No results for input(s): LIPASE, AMYLASE in the last 168 hours. No results for input(s): AMMONIA in the last 168 hours.  Coagulation Profile: No results for input(s): INR, PROTIME in the last 168 hours.  Cardiac Enzymes: No results for input(s): CKTOTAL, CKMB, CKMBINDEX, TROPONINI in the last 168 hours.  BNP (last 3 results) No results for input(s): PROBNP in the last 8760 hours.  Lipid Profile: No results for input(s): CHOL, HDL, LDLCALC, TRIG, CHOLHDL, LDLDIRECT in the last 72 hours.  Thyroid Function Tests: Recent Labs    07/22/20 1457  TSH 1.512    Anemia Panel: Recent Labs    07/22/20 1532  VITAMINB12 3,338*    Urine analysis:    Component Value Date/Time   COLORURINE YELLOW 07/22/2020 1357   APPEARANCEUR HAZY (A) 07/22/2020 1357   LABSPEC 1.015 07/22/2020 1357   PHURINE 6.0 07/22/2020 1357   GLUCOSEU NEGATIVE 07/22/2020 1357   HGBUR SMALL (A) 07/22/2020 1357   BILIRUBINUR NEGATIVE 07/22/2020 1357   KETONESUR NEGATIVE 07/22/2020 1357   PROTEINUR NEGATIVE 07/22/2020 1357    UROBILINOGEN 0.2 08/25/2014 0454   NITRITE NEGATIVE 07/22/2020 1357   LEUKOCYTESUR LARGE (A) 07/22/2020 1357    Sepsis Labs: Lactic Acid, Venous    Component Value Date/Time   LATICACIDVEN 0.9 01/12/2020 1120    MICROBIOLOGY: Recent Results (from the past 240 hour(s))  SARS CORONAVIRUS 2 (TAT 6-24 HRS) Nasopharyngeal Nasopharyngeal Swab     Status: None   Collection Time: 07/22/20  4:03 PM   Specimen: Nasopharyngeal Swab  Result Value Ref Range Status   SARS Coronavirus 2 NEGATIVE NEGATIVE Final    Comment: (NOTE) SARS-CoV-2 target nucleic acids are NOT DETECTED.  The SARS-CoV-2 RNA is generally detectable in upper and lower respiratory specimens during the acute phase of infection. Negative results do not preclude SARS-CoV-2 infection, do not rule out co-infections with other pathogens, and should not be used as the sole basis for treatment or other patient management decisions. Negative results must be combined with clinical observations, patient history, and epidemiological information. The expected result is Negative.  Fact Sheet for Patients: SugarRoll.be  Fact Sheet for Healthcare Providers: https://www.woods-mathews.com/  This test is not yet approved or cleared by the [place] FDA and  has been authorized for detection and/or diagnosis of SARS-CoV-2 by FDA under an Emergency Use Authorization (EUA). This EUA will remain  in effect (meaning this test can be used) for the duration of the COVID-19 declaration under Se ction 564(b)(1) of the Act, 21 U.S.C. section 360bbb-3(b)(1), unless the authorization is terminated or revoked sooner.  Performed at [facility], [place] [address] [place], [place] [address]     RADIOLOGY STUDIES/RESULTS: DG Chest 1 View  Result Date: 07/22/2020 CLINICAL DATA:  Syncope. EXAM: CHEST  1 VIEW COMPARISON:  January 20, 2020. FINDINGS: The heart size and mediastinal contours are  within normal limits. Both lungs are clear. The visualized skeletal structures are unremarkable. IMPRESSION: No active disease. Aortic Atherosclerosis (ICD10-I70.0). Electronically Signed   By: Marijo Conception M.D.   On: 07/22/2020 11:33   DG Pelvis 1-2 Views  Result Date: 07/22/2020 CLINICAL DATA:  Status post fall. Right femur deformity. Initial encounter. EXAM: PELVIS - 1-2 VIEW COMPARISON:  Plain films right hip 09/22/2017. FINDINGS: Bones are osteopenic. No acute bony or  joint abnormality is identified. Bipolar right hip hemiarthroplasty is unchanged. Calcifications projecting in the upper pelvis are also unchanged and may be calcified lymph nodes. IMPRESSION: No acute abnormality. Osteopenia. Electronically Signed   By: Inge Rise M.D.   On: 07/22/2020 11:33   CT Head Wo Contrast  Result Date: 07/22/2020 CLINICAL DATA:  Syncopal episode altered mental status EXAM: CT HEAD WITHOUT CONTRAST TECHNIQUE: Contiguous axial images were obtained from the base of the skull through the vertex without intravenous contrast. COMPARISON:  CT brain 11/28/2019 FINDINGS: Brain: No acute territorial infarction, hemorrhage, or intracranial mass. Atrophy and chronic small vessel ischemic changes of the white matter. Stable ventricle size. Vascular: No hyperdense vessels.  Carotid vascular calcification Skull: Normal. Negative for fracture or focal lesion. Sinuses/Orbits: No acute finding. Other: None IMPRESSION: 1. No CT evidence for acute intracranial abnormality. 2. Atrophy and chronic small vessel ischemic change of the white matter Electronically Signed   By: Donavan Foil M.D.   On: 07/22/2020 15:16   DG FEMUR, MIN 2 VIEWS RIGHT  Result Date: 07/22/2020 CLINICAL DATA:  Status post fall. Right femur deformity. Initial encounter. EXAM: RIGHT FEMUR 2 VIEWS COMPARISON:  Plain films right femur 06/23/2020 and 02/26/2018. FINDINGS: Plate and screws for fixation of a distal femur fracture are again seen. Has seen on  the most recent examination, there is no bridging bone across the metaphysis of the femur. Two of the most distal screws are broken and the distal end of the plate is laterally and inferiorly displaced, new since the 2020 exam but unchanged since the most recent exam. Position and alignment are unchanged since the most recent study. No new abnormality. Osteopenia noted. IMPRESSION: No acute finding. Fracture of the distal femur seen on the most recent examination is unchanged. Fixation hardware is in place. As seen on the most recent comparison, two of the distal most screws are broken and the distal aspect of the plate is laterally and inferiorly displaced. Electronically Signed   By: Inge Rise M.D.   On: 07/22/2020 11:38     LOS: 1 day   Oren Binet, MD  [facility]    To contact the attending provider between 7A-7P or the covering provider during after hours 7P-7A, please log into the web site [email or web address] and access using universal [place] password for that web site. If you do not have the password, please call the hospital operator.  07/23/2020, 10:30 AM

## 2020-07-24 LAB — URINE CULTURE: Culture: 80000 — AB

## 2020-07-24 LAB — CBC WITH DIFFERENTIAL/PLATELET
Abs Immature Granulocytes: 0.05 10*3/uL (ref 0.00–0.07)
Basophils Absolute: 0.1 10*3/uL (ref 0.0–0.1)
Basophils Relative: 1 %
Eosinophils Absolute: 0.1 10*3/uL (ref 0.0–0.5)
Eosinophils Relative: 1 %
HCT: 30.7 % — ABNORMAL LOW (ref 36.0–46.0)
Hemoglobin: 9.8 g/dL — ABNORMAL LOW (ref 12.0–15.0)
Immature Granulocytes: 1 %
Lymphocytes Relative: 15 %
Lymphs Abs: 1.5 10*3/uL (ref 0.7–4.0)
MCH: 28.6 pg (ref 26.0–34.0)
MCHC: 31.9 g/dL (ref 30.0–36.0)
MCV: 89.5 fL (ref 80.0–100.0)
Monocytes Absolute: 0.8 10*3/uL (ref 0.1–1.0)
Monocytes Relative: 8 %
Neutro Abs: 7.4 10*3/uL (ref 1.7–7.7)
Neutrophils Relative %: 74 %
Platelets: 222 10*3/uL (ref 150–400)
RBC: 3.43 MIL/uL — ABNORMAL LOW (ref 3.87–5.11)
RDW: 14.5 % (ref 11.5–15.5)
WBC: 9.9 10*3/uL (ref 4.0–10.5)
nRBC: 0 % (ref 0.0–0.2)

## 2020-07-24 LAB — BASIC METABOLIC PANEL
Anion gap: 9 (ref 5–15)
BUN: 19 mg/dL (ref 8–23)
CO2: 23 mmol/L (ref 22–32)
Calcium: 8.3 mg/dL — ABNORMAL LOW (ref 8.9–10.3)
Chloride: 99 mmol/L (ref 98–111)
Creatinine, Ser: 0.6 mg/dL (ref 0.44–1.00)
GFR, Estimated: >60 mL/min (ref >60)
Glucose, Bld: 107 mg/dL — ABNORMAL HIGH (ref 70–99)
Potassium: 3.6 mmol/L (ref 3.5–5.1)
Sodium: 131 mmol/L — ABNORMAL LOW (ref 135–145)

## 2020-07-24 LAB — MAGNESIUM: Magnesium: 1.9 mg/dL (ref 1.7–2.4)

## 2020-07-24 LAB — GLUCOSE, CAPILLARY: Glucose-Capillary: 97 mg/dL (ref 70–99)

## 2020-07-24 MED ORDER — METOPROLOL TARTRATE 12.5 MG HALF TABLET
12.5000 mg | ORAL_TABLET | Freq: Two times a day (BID) | ORAL | Status: DC
  Administered 2020-07-25: 12.5 mg via ORAL
  Filled 2020-07-24: qty 1

## 2020-07-24 MED ORDER — CEFDINIR 300 MG PO CAPS
300.0000 mg | ORAL_CAPSULE | Freq: Two times a day (BID) | ORAL | Status: DC
  Administered 2020-07-24 – 2020-07-25 (×3): 300 mg via ORAL
  Filled 2020-07-24 (×3): qty 1

## 2020-07-24 MED ORDER — SODIUM CHLORIDE 0.9 % IV BOLUS
250.0000 mL | Freq: Once | INTRAVENOUS | Status: AC
  Administered 2020-07-24: 250 mL via INTRAVENOUS

## 2020-07-24 NOTE — Progress Notes (Signed)
PROGRESS NOTE        PATIENT DETAILS Name: Stephanie Donovan Age: 87 y.o. Sex: female Date of Birth: March 01, 1927 Admit Date: 07/22/2020 Admitting Physician Thurnell Lose, MD [id number], Provider Not In  Brief Narrative: Patient is a 87 y.o. female with history of TIA, hypothyroidism, PAF, hypotension induced syncope, right femur surgery with recent malunion/dislocation of prosthesis (not a surgical candidate) presented to the ED from her SNF for syncope associated due to hypotension, she was found to have UTI.  Significant events: 7/1>> admit from SNF-syncope-hypotensive at facility.  Significant studies: 7/1>> CT head: No acute intracranial abnormality. 7/1>> CXR: No pneumonia 7/1>> x-ray pelvis: No fractures 7/1>> x-ray right femur: No acute findings-fracture of distal femur seen on most recent examination is unchanged.  Fixation hardware is present. 7/2>> Echo: EF 60-65%  Antimicrobial therapy: 7/1>> COVID PCR: Negative  Microbiology data: 7/1>> urine culture: Pansensitive E. coli  Procedures : None  Consults: None  DVT Prophylaxis : Place TED hose Start: 07/23/20 1037 heparin injection 5,000 Units Start: 07/22/20 1634   Subjective: Completely awake and alert-answering most of my questions appropriately today.   Assessment/Plan: Syncope due to orthostatic hypotension: Recurrent issue-hypotension could have been triggered by use of diuretics/UTI.  Telemetry unremarkable overnight.  Echo with preserved EF-continue TED stockings-minimize use of diuretics as much as possible-we will tolerate some amount of peripheral edema.   Acute metabolic encephalopathy: Improved-suspect patient is back to baseline.  Etiology of encephalopathy likely due to UTI/hypotension.  CT head without any acute abnormalities.    PAF: Brief episode of narrow complex tachycardia overnight-given her advanced age-multiple medical comorbidities-agree that she is not a  anticoagulation candidate.  Continue metoprolol/aspirin.   Chronic diastolic heart failure: Euvolemic on exam.  E. coli UTI: Hard to discern if she has symptoms-on IV Rocephin-we will switch to [place].    HTN: BP stable-continue low-dose metoprolol-follow and adjust.  Hypothyroidism: TSH stable-continue levothyroxine  History of right distal femur fracture/dislocation: Follows with orthopedics-Dr. Kerby Less a candidate for surgery-she is nonweightbearing in the right leg.  She is mostly bedbound-but at times is able to transfer with assistance from bed to wheelchair.  Dementia/cognitive dysfunction: At risk for delirium-supportive care-maintain delirium precautions.  Encephalopathy has improved-Per daughter (who saw her yesterday evening)-she is back to baseline.  Diet: Diet Order             DIET SOFT Room service appropriate? Yes; Fluid consistency: Thin  Diet effective now                    Code Status: DNR  Family Communication: Spoke with Daughter-[place] [phone] over phone 7/3  Disposition Plan: Status is: Inpatient  Remains inpatient appropriate because:Inpatient level of care appropriate due to severity of illness  Dispo: The patient is from: SNF              Anticipated d/c is to: SNF              Patient currently is not medically stable to d/c.   Difficult to place patient No    Barriers to Discharge: Syncope/UTI-awaiting ALF bed.  Antimicrobial agents: Anti-infectives (From admission, onward)    Start     Dose/Rate Route Frequency Ordered Stop   07/23/20 1500  cefTRIAXone (ROCEPHIN) 1 g in sodium chloride 0.9 % 100 mL IVPB  1 g 200 mL/hr over 30 Minutes Intravenous Every 24 hours 07/22/20 1530     07/22/20 1445  cefTRIAXone (ROCEPHIN) 1 g in sodium chloride 0.9 % 100 mL IVPB        1 g 200 mL/hr over 30 Minutes Intravenous  Once 07/22/20 1444 07/22/20 1535        Time spent: 25  minutes-Greater than 50% of this time was spent  in counseling, explanation of diagnosis, planning of further management, and coordination of care.  MEDICATIONS: Scheduled Meds:  aspirin EC  81 mg Oral Daily   brimonidine  1 drop Both Eyes TID   docusate sodium  100 mg Oral Daily   heparin  5,000 Units Subcutaneous Q8H   levothyroxine  50 mcg Oral QAC breakfast   magnesium oxide  400 mg Oral BID   metoprolol tartrate  25 mg Oral BID   timolol  1 drop Both Eyes BID   vitamin B-12  1,000 mcg Oral Daily   Continuous Infusions:  cefTRIAXone (ROCEPHIN)  IV 1 g (07/23/20 1457)   PRN Meds:.acetaminophen **OR** acetaminophen, metoprolol tartrate, ondansetron **OR** ondansetron (ZOFRAN) IV, polyethylene glycol, traMADol   PHYSICAL EXAM: Vital signs: Vitals:   07/23/20 1934 07/24/20 0011 07/24/20 0354 07/24/20 0817  BP: 118/73 122/63 121/69 129/88  Pulse: (!) 102 (!) 101 95 (!) 104  Resp: 18 18 20 18   Temp: 98.7 F (37.1 C) 98.4 F (36.9 C) 98.4 F (36.9 C) 99 F (37.2 C)  TempSrc: Oral Oral Oral Oral  SpO2: 93% 92% 93% 94%  Weight:  61 kg     Filed Weights   07/23/20 0425 07/24/20 0011  Weight: 60.4 kg 61 kg   Body mass index is 23.08 kg/m.   Gen Exam: Awake-answering most of my questions appropriately.  Not in any distress. HEENT:atraumatic, normocephalic Chest: B/L clear to auscultation anteriorly CVS:S1S2 regular Abdomen:soft non tender, non distended Extremities:no edema Neurology: Non focal Skin: no rash   I have personally reviewed following labs and imaging studies  LABORATORY DATA: CBC: Recent Labs  Lab 07/22/20 1008 07/22/20 1532 07/23/20 0409 07/24/20 0409  WBC 11.4* 15.6* 14.0* 9.9  NEUTROABS 9.0*  --  13.0* 7.4  HGB 10.6* 12.2 11.6* 9.8*  HCT 33.9* 38.4 35.6* 30.7*  MCV 95.0 93.9 89.2 89.5  PLT 297 283 243 222     Basic Metabolic Panel: Recent Labs  Lab 07/22/20 1008 07/22/20 1501 07/22/20 1532 07/23/20 0608 07/24/20 0409  NA 135  --   --  133* 131*  K 4.3  --   --  4.7 3.6  CL 103   --   --  101 99  CO2 24  --   --  22 23  GLUCOSE 144*  --   --  127* 107*  BUN 13  --   --  19 19  CREATININE 0.72  --  0.59 0.81 0.60  CALCIUM 8.7*  --   --  8.7* 8.3*  MG  --  2.0  --  1.9 1.9     GFR: CrCl cannot be calculated (Unknown ideal weight.).  Liver Function Tests: Recent Labs  Lab 07/22/20 1008 07/23/20 0608  AST 14* 24  ALT 11 14  ALKPHOS 91 99  BILITOT 0.7 0.9  PROT 6.5 6.6  ALBUMIN 2.8* 2.8*    No results for input(s): LIPASE, AMYLASE in the last 168 hours. No results for input(s): AMMONIA in the last 168 hours.  Coagulation Profile: No results for input(s): INR, PROTIME  in the last 168 hours.  Cardiac Enzymes: No results for input(s): CKTOTAL, CKMB, CKMBINDEX, TROPONINI in the last 168 hours.  BNP (last 3 results) No results for input(s): PROBNP in the last 8760 hours.  Lipid Profile: No results for input(s): CHOL, HDL, LDLCALC, TRIG, CHOLHDL, LDLDIRECT in the last 72 hours.  Thyroid Function Tests: Recent Labs    07/22/20 1457  TSH 1.512     Anemia Panel: Recent Labs    07/22/20 1532  VITAMINB12 3,338*     Urine analysis:    Component Value Date/Time   COLORURINE YELLOW 07/22/2020 1357   APPEARANCEUR HAZY (A) 07/22/2020 1357   LABSPEC 1.015 07/22/2020 1357   PHURINE 6.0 07/22/2020 1357   GLUCOSEU NEGATIVE 07/22/2020 1357   HGBUR SMALL (A) 07/22/2020 1357   BILIRUBINUR NEGATIVE 07/22/2020 1357   KETONESUR NEGATIVE 07/22/2020 1357   PROTEINUR NEGATIVE 07/22/2020 1357   UROBILINOGEN 0.2 08/25/2014 0454   NITRITE NEGATIVE 07/22/2020 1357   LEUKOCYTESUR LARGE (A) 07/22/2020 1357    Sepsis Labs: Lactic Acid, Venous    Component Value Date/Time   LATICACIDVEN 0.9 01/12/2020 1120    MICROBIOLOGY: Recent Results (from the past 240 hour(s))  Urine Culture     Status: Abnormal   Collection Time: 07/22/20  2:42 PM   Specimen: Urine, Clean Catch  Result Value Ref Range Status   Specimen Description URINE, CLEAN CATCH  Final    Special Requests   Final    NONE Performed at [facility], [place] [address] [place], [place] [address]    Culture 80,000 COLONIES/mL ESCHERICHIA COLI (A)  Final   Report Status 07/24/2020 FINAL  Final   Organism ID, Bacteria ESCHERICHIA COLI (A)  Final      Susceptibility   Escherichia coli - MIC*    AMPICILLIN <=2 SENSITIVE Sensitive     CEFAZOLIN <=4 SENSITIVE Sensitive     CEFEPIME <=0.12 SENSITIVE Sensitive     CEFTRIAXONE <=0.25 SENSITIVE Sensitive     CIPROFLOXACIN <=0.25 SENSITIVE Sensitive     GENTAMICIN <=1 SENSITIVE Sensitive     IMIPENEM <=0.25 SENSITIVE Sensitive     NITROFURANTOIN <=16 SENSITIVE Sensitive     TRIMETH/SULFA <=20 SENSITIVE Sensitive     AMPICILLIN/SULBACTAM <=2 SENSITIVE Sensitive     PIP/TAZO <=4 SENSITIVE Sensitive     * 80,000 COLONIES/mL ESCHERICHIA COLI  SARS CORONAVIRUS 2 (TAT 6-24 HRS) Nasopharyngeal Nasopharyngeal Swab     Status: None   Collection Time: 07/22/20  4:03 PM   Specimen: Nasopharyngeal Swab  Result Value Ref Range Status   SARS Coronavirus 2 NEGATIVE NEGATIVE Final    Comment: (NOTE) SARS-CoV-2 target nucleic acids are NOT DETECTED.  The SARS-CoV-2 RNA is generally detectable in upper and lower respiratory specimens during the acute phase of infection. Negative results do not preclude SARS-CoV-2 infection, do not rule out co-infections with other pathogens, and should not be used as the sole basis for treatment or other patient management decisions. Negative results must be combined with clinical observations, patient history, and epidemiological information. The expected result is Negative.  Fact Sheet for Patients: SugarRoll.be  Fact Sheet for Healthcare Providers: https://www.woods-mathews.com/  This test is not yet approved or cleared by the [place] FDA and  has been authorized for detection and/or diagnosis of SARS-CoV-2 by FDA under an Emergency Use Authorization  (EUA). This EUA will remain  in effect (meaning this test can be used) for the duration of the COVID-19 declaration under Se ction 564(b)(1) of the Act, 21  U.S.C. section 360bbb-3(b)(1), unless the authorization is terminated or revoked sooner.  Performed at [facility], [place] [address], [place], [place] [address]     RADIOLOGY STUDIES/RESULTS: DG Chest 1 View  Result Date: 07/22/2020 CLINICAL DATA:  Syncope. EXAM: CHEST  1 VIEW COMPARISON:  January 20, 2020. FINDINGS: The heart size and mediastinal contours are within normal limits. Both lungs are clear. The visualized skeletal structures are unremarkable. IMPRESSION: No active disease. Aortic Atherosclerosis (ICD10-I70.0). Electronically Signed   By: Marijo Conception M.D.   On: 07/22/2020 11:33   DG Pelvis 1-2 Views  Result Date: 07/22/2020 CLINICAL DATA:  Status post fall. Right femur deformity. Initial encounter. EXAM: PELVIS - 1-2 VIEW COMPARISON:  Plain films right hip 09/22/2017. FINDINGS: Bones are osteopenic. No acute bony or joint abnormality is identified. Bipolar right hip hemiarthroplasty is unchanged. Calcifications projecting in the upper pelvis are also unchanged and may be calcified lymph nodes. IMPRESSION: No acute abnormality. Osteopenia. Electronically Signed   By: Inge Rise M.D.   On: 07/22/2020 11:33   CT Head Wo Contrast  Result Date: 07/22/2020 CLINICAL DATA:  Syncopal episode altered mental status EXAM: CT HEAD WITHOUT CONTRAST TECHNIQUE: Contiguous axial images were obtained from the base of the skull through the vertex without intravenous contrast. COMPARISON:  CT brain 11/28/2019 FINDINGS: Brain: No acute territorial infarction, hemorrhage, or intracranial mass. Atrophy and chronic small vessel ischemic changes of the white matter. Stable ventricle size. Vascular: No hyperdense vessels.  Carotid vascular calcification Skull: Normal. Negative for fracture or focal lesion. Sinuses/Orbits: No acute finding.  Other: None IMPRESSION: 1. No CT evidence for acute intracranial abnormality. 2. Atrophy and chronic small vessel ischemic change of the white matter Electronically Signed   By: Donavan Foil M.D.   On: 07/22/2020 15:16   ECHOCARDIOGRAM COMPLETE  Result Date: 07/23/2020    ECHOCARDIOGRAM REPORT   Patient Name:   Stephanie Donovan Date of Exam: 07/23/2020 Medical Rec #:  [id number]           Height:       64.0 in Accession #:    [id number]          Weight:       133.2 lb Date of Birth:  April 27, 1927           BSA:          1.646 m Patient Age:    68 years            BP:           138/89 mmHg Patient Gender: F                   HR:           107 bpm. Exam Location:  Inpatient Procedure: 2D Echo, Cardiac Doppler and Color Doppler Indications:    CHF  History:        Patient has prior history of Echocardiogram examinations, most                 recent 11/29/2019. TIA, Arrythmias:Atrial Fibrillation;                 Signs/Symptoms:Altered Mental Status and Syncope.  Sonographer:    Dustin Flock Referring Phys: [facility]  Sonographer Comments: Image acquisition challenging due to respiratory motion and Patient is confused. IMPRESSIONS  1. Left ventricular ejection fraction, by estimation, is 60 to 65%. The left ventricle has normal function. The left ventricle  has no regional wall motion abnormalities. Left ventricular diastolic parameters are consistent with Grade I diastolic dysfunction (impaired relaxation).  2. Right ventricular systolic function is normal. The right ventricular size is normal. There is mildly elevated pulmonary artery systolic pressure.  3. The mitral valve is normal in structure. Mild mitral valve regurgitation. No evidence of mitral stenosis.  4. The aortic valve is tricuspid. Aortic valve regurgitation is not visualized. Mild aortic valve sclerosis is present, with no evidence of aortic valve stenosis.  5. The inferior vena cava is normal in size with greater than 50% respiratory  variability, suggesting right atrial pressure of 3 mmHg. FINDINGS  Left Ventricle: Left ventricular ejection fraction, by estimation, is 60 to 65%. The left ventricle has normal function. The left ventricle has no regional wall motion abnormalities. The left ventricular internal cavity size was normal in size. There is  no left ventricular hypertrophy. Left ventricular diastolic parameters are consistent with Grade I diastolic dysfunction (impaired relaxation). Right Ventricle: The right ventricular size is normal. Right ventricular systolic function is normal. There is mildly elevated pulmonary artery systolic pressure. The tricuspid regurgitant velocity is 3.01 m/s, and with an assumed right atrial pressure of 3 mmHg, the estimated right ventricular systolic pressure is 60.4 mmHg. Left Atrium: Left atrial size was normal in size. Right Atrium: Right atrial size was normal in size. Pericardium: There is no evidence of pericardial effusion. Mitral Valve: The mitral valve is normal in structure. Mild mitral annular calcification. Mild mitral valve regurgitation. No evidence of mitral valve stenosis. Tricuspid Valve: The tricuspid valve is normal in structure. Tricuspid valve regurgitation is trivial. No evidence of tricuspid stenosis. Aortic Valve: The aortic valve is tricuspid. Aortic valve regurgitation is not visualized. Mild aortic valve sclerosis is present, with no evidence of aortic valve stenosis. Pulmonic Valve: The pulmonic valve was normal in structure. Pulmonic valve regurgitation is not visualized. No evidence of pulmonic stenosis. Aorta: The aortic root is normal in size and structure. Venous: The inferior vena cava is normal in size with greater than 50% respiratory variability, suggesting right atrial pressure of 3 mmHg. IAS/Shunts: The interatrial septum was not well visualized.  LEFT VENTRICLE PLAX 2D LVIDd:         4.00 cm  Diastology LVIDs:         3.00 cm  LV e' medial:    6.20 cm/s LV PW:          0.80 cm  LV E/e' medial:  10.3 LV IVS:        0.90 cm  LV e' lateral:   5.66 cm/s LVOT diam:     2.00 cm  LV E/e' lateral: 11.3 LV SV:         48 LV SV Index:   29 LVOT Area:     3.14 cm  RIGHT VENTRICLE RV Basal diam:  3.50 cm RV S prime:     13.60 cm/s TAPSE (M-mode): 1.7 cm LEFT ATRIUM             Index       RIGHT ATRIUM           Index LA diam:        3.00 cm 1.82 cm/m  RA Area:     12.00 cm LA Vol (A2C):   20.1 ml 12.21 ml/m RA Volume:   29.40 ml  17.86 ml/m LA Vol (A4C):   39.5 ml 24.00 ml/m LA Biplane Vol: 30.2 ml 18.35 ml/m  AORTIC VALVE LVOT  Vmax:   115.00 cm/s LVOT Vmean:  71.900 cm/s LVOT VTI:    0.153 m  AORTA Ao Root diam: 2.90 cm MITRAL VALVE                TRICUSPID VALVE MV Area (PHT): 3.95 cm     TR Peak grad:   36.2 mmHg MV Decel Time: 192 msec     TR Vmax:        301.00 cm/s MV E velocity: 63.70 cm/s MV A velocity: 100.00 cm/s  SHUNTS MV E/A ratio:  0.64         Systemic VTI:  0.15 m                             Systemic Diam: 2.00 cm Kirk Ruths MD Electronically signed by Kirk Ruths MD Signature Date/Time: 07/23/2020/12:11:05 PM    Final    DG FEMUR, MIN 2 VIEWS RIGHT  Result Date: 07/22/2020 CLINICAL DATA:  Status post fall. Right femur deformity. Initial encounter. EXAM: RIGHT FEMUR 2 VIEWS COMPARISON:  Plain films right femur 06/23/2020 and 02/26/2018. FINDINGS: Plate and screws for fixation of a distal femur fracture are again seen. Has seen on the most recent examination, there is no bridging bone across the metaphysis of the femur. Two of the most distal screws are broken and the distal end of the plate is laterally and inferiorly displaced, new since the 2020 exam but unchanged since the most recent exam. Position and alignment are unchanged since the most recent study. No new abnormality. Osteopenia noted. IMPRESSION: No acute finding. Fracture of the distal femur seen on the most recent examination is unchanged. Fixation hardware is in place. As seen on the most recent  comparison, two of the distal most screws are broken and the distal aspect of the plate is laterally and inferiorly displaced. Electronically Signed   By: Inge Rise M.D.   On: 07/22/2020 11:38     LOS: 2 days   Oren Binet, MD  [facility]    To contact the attending provider between 7A-7P or the covering provider during after hours 7P-7A, please log into the web site [email or web address] and access using universal [place] password for that web site. If you do not have the password, please call the hospital operator.  07/24/2020, 10:58 AM

## 2020-07-24 NOTE — Progress Notes (Addendum)
About to give meds, woke pt up then pt started to get agitated, stated " never mind, leave me alone." Pt is alert, but can't say where she is. Pt is just not compliant.    0200 7/04 Pt woke up and trying to get out of the bed. Noted confusion. Took a while for her to realize she is in hospital Pt now compliant. Took her night meds. Pt not in pain. Will monitor.

## 2020-07-24 NOTE — Social Work (Signed)
CSW was alerted that pt may be discharged today. CSW attempted to follow up with [facility] however was unable to reach them.CSW spoke with pt's daughter [place] and she is in agreement with pt returning to [facility] however she is requesting that pt remain in the hospital another day. CSW let her know that decision would need to be made by pt's MD. CSW alerted MD of pt's daughter request as well as she wants to speak with the MD.

## 2020-07-25 DIAGNOSIS — I10 Essential (primary) hypertension: Secondary | ICD-10-CM

## 2020-07-25 LAB — RESP PANEL BY RT-PCR (FLU A&B, COVID) ARPGX2
Influenza A by PCR: NEGATIVE
Influenza B by PCR: NEGATIVE
SARS Coronavirus 2 by RT PCR: NEGATIVE

## 2020-07-25 LAB — CBC WITH DIFFERENTIAL/PLATELET
Abs Immature Granulocytes: 0.04 10*3/uL (ref 0.00–0.07)
Basophils Absolute: 0.1 10*3/uL (ref 0.0–0.1)
Basophils Relative: 1 %
Eosinophils Absolute: 0.3 10*3/uL (ref 0.0–0.5)
Eosinophils Relative: 4 %
HCT: 31 % — ABNORMAL LOW (ref 36.0–46.0)
Hemoglobin: 10 g/dL — ABNORMAL LOW (ref 12.0–15.0)
Immature Granulocytes: 1 %
Lymphocytes Relative: 22 %
Lymphs Abs: 1.7 10*3/uL (ref 0.7–4.0)
MCH: 28.7 pg (ref 26.0–34.0)
MCHC: 32.3 g/dL (ref 30.0–36.0)
MCV: 89.1 fL (ref 80.0–100.0)
Monocytes Absolute: 0.8 10*3/uL (ref 0.1–1.0)
Monocytes Relative: 11 %
Neutro Abs: 4.6 10*3/uL (ref 1.7–7.7)
Neutrophils Relative %: 61 %
Platelets: 220 10*3/uL (ref 150–400)
RBC: 3.48 MIL/uL — ABNORMAL LOW (ref 3.87–5.11)
RDW: 14.4 % (ref 11.5–15.5)
WBC: 7.6 10*3/uL (ref 4.0–10.5)
nRBC: 0 % (ref 0.0–0.2)

## 2020-07-25 LAB — MAGNESIUM: Magnesium: 1.8 mg/dL (ref 1.7–2.4)

## 2020-07-25 MED ORDER — CEFDINIR 300 MG PO CAPS
300.0000 mg | ORAL_CAPSULE | Freq: Two times a day (BID) | ORAL | 0 refills | Status: DC
Start: 2020-07-25 — End: 2020-08-04

## 2020-07-25 NOTE — Discharge Summary (Signed)
PATIENT DETAILS Name: Stephanie Donovan Age: 87 y.o. Sex: female Date of Birth: Sep 30, 1927 MRN: [id number]. Admitting Physician: Thurnell Lose, MD [id number], Provider Not In  Admit Date: 07/22/2020 Discharge date: 07/25/2020  Recommendations for Outpatient Follow-up:  Follow up with PCP in 1-2 weeks Please obtain CMP/CBC in one week  Admitted From:  Home  Disposition: [place]: No  Equipment/Devices: None  Discharge Condition: Stable  CODE STATUS: FULL CODE  Diet recommendation:  Diet Order             Diet - low sodium heart healthy           DIET SOFT Room service appropriate? Yes; Fluid consistency: Thin  Diet effective now                    Brief Narrative: Patient is a 87 y.o. female with history of TIA, hypothyroidism, PAF, hypotension induced syncope, right femur surgery with recent malunion/dislocation of prosthesis (not a surgical candidate) presented to the ED from her SNF for syncope associated due to hypotension, she was found to have UTI.   Significant events: 7/1>> admit from SNF-syncope-hypotensive at facility.   Significant studies: 7/1>> CT head: No acute intracranial abnormality. 7/1>> CXR: No pneumonia 7/1>> x-ray pelvis: No fractures 7/1>> x-ray right femur: No acute findings-fracture of distal femur seen on most recent examination is unchanged.  Fixation hardware is present. 7/2>> Echo: EF 60-65%   Antimicrobial therapy: Rocephin: 7/1>> 7/2 Omnicef: 7/3>>   Microbiology data: 7/1>> urine culture: Pansensitive E. Coli 7/1>> COVID PCR: Negative   Procedures : None   Consults: None  Brief Hospital Course: Syncope due to orthostatic hypotension: Recurrent issue-hypotension could have been triggered by use of diuretics/UTI.  Telemetry unremarkable overnight.  Echo with preserved EF-continue TED stockings-minimize use of diuretics as much as possible-we will tolerate some amount of peripheral edema.   Acute  metabolic encephalopathy: Improved-suspect patient is back to baseline.  Etiology of encephalopathy likely due to UTI/hypotension.  CT head without any acute abnormalities.     PAF: Brief episode of narrow complex tachycardia overnight-given her advanced age-multiple medical comorbidities-agree that she is not a anticoagulation candidate.  Continue metoprolol/aspirin.   Chronic diastolic heart failure: Euvolemic on exam.   E. coli UTI: Hard to discern if she has symptoms-initially on IV Rocephin-has been switched to Omnicef-we will complete a total of 7 days of treatment.   HTN: BP stable-continue low-dose metoprolol-follow and adjust.   Hypothyroidism: TSH stable-continue levothyroxine   History of right distal femur fracture/dislocation: Follows with orthopedics-Dr. Kerby Less a candidate for surgery-she is nonweightbearing in the right leg.  She is mostly bedbound-but at times is able to transfer with assistance from bed to wheelchair.   Dementia/cognitive dysfunction: At risk for delirium-supportive care-maintain delirium precautions.  Encephalopathy has improved-Per daughter (who saw her yesterday evening)-she is back to baseline.   Procedures None  Discharge Diagnoses:  Active Problems:   Syncope   Discharge Instructions:  Activity:  As tolerated with Full fall precautions use walker/cane & assistance as needed   Discharge Instructions     Diet - low sodium heart healthy   Complete by: As directed    Discharge instructions   Complete by: As directed    Follow with Primary MD  System, Provider Not In in 1-2 weeks  Please get a complete blood count and chemistry panel checked by your Primary MD at your next visit, and again as instructed by your Primary  MD.  Get Medicines reviewed and adjusted: Please take all your medications with you for your next visit with your Primary MD  Laboratory/radiological data: Please request your Primary MD to go over all hospital tests  and procedure/radiological results at the follow up, please ask your Primary MD to get all Hospital records sent to his/her office.  In some cases, they will be blood work, cultures and biopsy results pending at the time of your discharge. Please request that your primary care M.D. follows up on these results.  Also Note the following: If you experience worsening of your admission symptoms, develop shortness of breath, life threatening emergency, suicidal or homicidal thoughts you must seek medical attention immediately by calling 911 or calling your MD immediately  if symptoms less severe.  You must read complete instructions/literature along with all the possible adverse reactions/side effects for all the Medicines you take and that have been prescribed to you. Take any new Medicines after you have completely understood and accpet all the possible adverse reactions/side effects.   Do not drive when taking Pain medications or sleeping medications (Benzodaizepines)  Do not take more than prescribed Pain, Sleep and Anxiety Medications. It is not advisable to combine anxiety,sleep and pain medications without talking with your primary care practitioner  Special Instructions: If you have smoked or chewed Tobacco  in the last 2 yrs please stop smoking, stop any regular Alcohol  and or any Recreational drug use.  Wear Seat belts while driving.  Please note: You were cared for by a hospitalist during your hospital stay. Once you are discharged, your primary care physician will handle any further medical issues. Please note that NO REFILLS for any discharge medications will be authorized once you are discharged, as it is imperative that you return to your primary care physician (or establish a relationship with a primary care physician if you do not have one) for your post hospital discharge needs so that they can reassess your need for medications and monitor your lab values.   Increase activity slowly    Complete by: As directed       Allergies as of 07/25/2020       Reactions   Aspirin Palpitations, Other (See Comments)   Unspecified Mixed reactions   Epinephrine Other (See Comments)   Rapped heart beat   Penicillins Other (See Comments)   Lost hearing temporarily DID THE REACTION INVOLVE: Swelling of the face/tongue/throat, SOB, or low BP? No Sudden or severe rash/hives, skin peeling, or the inside of the mouth or nose? No Did it require medical treatment? No When did it last happen?       If all above answers are "NO", may proceed with cephalosporin use.   Codeine Palpitations        Medication List     STOP taking these medications    furosemide 20 MG tablet Commonly known as: LASIX       TAKE these medications    acetaminophen 500 MG tablet Commonly known as: TYLENOL Take 500 mg by mouth in the morning and at bedtime.   aspirin EC 81 MG tablet Take 81 mg by mouth daily.   brimonidine 0.1 % Soln Commonly known as: ALPHAGAN P Place 1 drop into the left eye in the morning and at bedtime.   brinzolamide 1 % ophthalmic suspension Commonly known as: AZOPT Place 1 drop into both eyes in the morning and at bedtime.   cefdinir 300 MG capsule Commonly known as: OMNICEF Take 1  capsule (300 mg total) by mouth every 12 (twelve) hours.   vitamin B-12 1000 MCG tablet Commonly known as: CYANOCOBALAMIN Take 1,000 mcg by mouth daily.   cyanocobalamin 100 MCG tablet Take 1 tablet (100 mcg total) by mouth daily.   docusate sodium 100 MG capsule Commonly known as: COLACE Take 100 mg by mouth daily.   levothyroxine 50 MCG tablet Commonly known as: SYNTHROID Take 50 mcg by mouth daily before breakfast.   magnesium oxide 400 (241.3 Mg) MG tablet Commonly known as: MAG-OX Take 1 tablet (400 mg total) by mouth 2 (two) times daily.   metoprolol succinate 25 MG 24 hr tablet Commonly known as: TOPROL-XL Take 12.5 mg by mouth daily.   timolol 0.25 % ophthalmic  solution Commonly known as: TIMOPTIC Place 1 drop into both eyes 2 (two) times daily.        Follow-up Information     Primary care MD. Schedule an appointment as soon as possible for a visit in 1 week(s).                 Allergies  Allergen Reactions   Aspirin Palpitations and Other (See Comments)    Unspecified Mixed reactions   Epinephrine Other (See Comments)    Rapped heart beat   Penicillins Other (See Comments)    Lost hearing temporarily DID THE REACTION INVOLVE: Swelling of the face/tongue/throat, SOB, or low BP? No Sudden or severe rash/hives, skin peeling, or the inside of the mouth or nose? No Did it require medical treatment? No When did it last happen?       If all above answers are "NO", may proceed with cephalosporin use.    Codeine Palpitations      Consultations:  None   Other Procedures/Studies: DG Chest 1 View  Result Date: 07/22/2020 CLINICAL DATA:  Syncope. EXAM: CHEST  1 VIEW COMPARISON:  January 20, 2020. FINDINGS: The heart size and mediastinal contours are within normal limits. Both lungs are clear. The visualized skeletal structures are unremarkable. IMPRESSION: No active disease. Aortic Atherosclerosis (ICD10-I70.0). Electronically Signed   By: Marijo Conception M.D.   On: 07/22/2020 11:33   DG Pelvis 1-2 Views  Result Date: 07/22/2020 CLINICAL DATA:  Status post fall. Right femur deformity. Initial encounter. EXAM: PELVIS - 1-2 VIEW COMPARISON:  Plain films right hip 09/22/2017. FINDINGS: Bones are osteopenic. No acute bony or joint abnormality is identified. Bipolar right hip hemiarthroplasty is unchanged. Calcifications projecting in the upper pelvis are also unchanged and may be calcified lymph nodes. IMPRESSION: No acute abnormality. Osteopenia. Electronically Signed   By: Inge Rise M.D.   On: 07/22/2020 11:33   CT Head Wo Contrast  Result Date: 07/22/2020 CLINICAL DATA:  Syncopal episode altered mental status EXAM: CT HEAD  WITHOUT CONTRAST TECHNIQUE: Contiguous axial images were obtained from the base of the skull through the vertex without intravenous contrast. COMPARISON:  CT brain 11/28/2019 FINDINGS: Brain: No acute territorial infarction, hemorrhage, or intracranial mass. Atrophy and chronic small vessel ischemic changes of the white matter. Stable ventricle size. Vascular: No hyperdense vessels.  Carotid vascular calcification Skull: Normal. Negative for fracture or focal lesion. Sinuses/Orbits: No acute finding. Other: None IMPRESSION: 1. No CT evidence for acute intracranial abnormality. 2. Atrophy and chronic small vessel ischemic change of the white matter Electronically Signed   By: Donavan Foil M.D.   On: 07/22/2020 15:16   ECHOCARDIOGRAM COMPLETE  Result Date: 07/23/2020    ECHOCARDIOGRAM REPORT   Patient Name:  Stephanie Donovan Date of Exam: 07/23/2020 Medical Rec #:  [id number]           Height:       64.0 in Accession #:    [id number]          Weight:       133.2 lb Date of Birth:  1927-12-14           BSA:          1.646 m Patient Age:    58 years            BP:           138/89 mmHg Patient Gender: F                   HR:           107 bpm. Exam Location:  Inpatient Procedure: 2D Echo, Cardiac Doppler and Color Doppler Indications:    CHF  History:        Patient has prior history of Echocardiogram examinations, most                 recent 11/29/2019. TIA, Arrythmias:Atrial Fibrillation;                 Signs/Symptoms:Altered Mental Status and Syncope.  Sonographer:    Dustin Flock Referring Phys: Graylin Shiver [facility]  Sonographer Comments: Image acquisition challenging due to respiratory motion and Patient is confused. IMPRESSIONS  1. Left ventricular ejection fraction, by estimation, is 60 to 65%. The left ventricle has normal function. The left ventricle has no regional wall motion abnormalities. Left ventricular diastolic parameters are consistent with Grade I diastolic dysfunction (impaired  relaxation).  2. Right ventricular systolic function is normal. The right ventricular size is normal. There is mildly elevated pulmonary artery systolic pressure.  3. The mitral valve is normal in structure. Mild mitral valve regurgitation. No evidence of mitral stenosis.  4. The aortic valve is tricuspid. Aortic valve regurgitation is not visualized. Mild aortic valve sclerosis is present, with no evidence of aortic valve stenosis.  5. The inferior vena cava is normal in size with greater than 50% respiratory variability, suggesting right atrial pressure of 3 mmHg. FINDINGS  Left Ventricle: Left ventricular ejection fraction, by estimation, is 60 to 65%. The left ventricle has normal function. The left ventricle has no regional wall motion abnormalities. The left ventricular internal cavity size was normal in size. There is  no left ventricular hypertrophy. Left ventricular diastolic parameters are consistent with Grade I diastolic dysfunction (impaired relaxation). Right Ventricle: The right ventricular size is normal. Right ventricular systolic function is normal. There is mildly elevated pulmonary artery systolic pressure. The tricuspid regurgitant velocity is 3.01 m/s, and with an assumed right atrial pressure of 3 mmHg, the estimated right ventricular systolic pressure is 11.5 mmHg. Left Atrium: Left atrial size was normal in size. Right Atrium: Right atrial size was normal in size. Pericardium: There is no evidence of pericardial effusion. Mitral Valve: The mitral valve is normal in structure. Mild mitral annular calcification. Mild mitral valve regurgitation. No evidence of mitral valve stenosis. Tricuspid Valve: The tricuspid valve is normal in structure. Tricuspid valve regurgitation is trivial. No evidence of tricuspid stenosis. Aortic Valve: The aortic valve is tricuspid. Aortic valve regurgitation is not visualized. Mild aortic valve sclerosis is present, with no evidence of aortic valve stenosis.  Pulmonic Valve: The pulmonic valve was normal in structure. Pulmonic valve regurgitation is not visualized. No evidence  of pulmonic stenosis. Aorta: The aortic root is normal in size and structure. Venous: The inferior vena cava is normal in size with greater than 50% respiratory variability, suggesting right atrial pressure of 3 mmHg. IAS/Shunts: The interatrial septum was not well visualized.  LEFT VENTRICLE PLAX 2D LVIDd:         4.00 cm  Diastology LVIDs:         3.00 cm  LV e' medial:    6.20 cm/s LV PW:         0.80 cm  LV E/e' medial:  10.3 LV IVS:        0.90 cm  LV e' lateral:   5.66 cm/s LVOT diam:     2.00 cm  LV E/e' lateral: 11.3 LV SV:         48 LV SV Index:   29 LVOT Area:     3.14 cm  RIGHT VENTRICLE RV Basal diam:  3.50 cm RV S prime:     13.60 cm/s TAPSE (M-mode): 1.7 cm LEFT ATRIUM             Index       RIGHT ATRIUM           Index LA diam:        3.00 cm 1.82 cm/m  RA Area:     12.00 cm LA Vol (A2C):   20.1 ml 12.21 ml/m RA Volume:   29.40 ml  17.86 ml/m LA Vol (A4C):   39.5 ml 24.00 ml/m LA Biplane Vol: 30.2 ml 18.35 ml/m  AORTIC VALVE LVOT Vmax:   115.00 cm/s LVOT Vmean:  71.900 cm/s LVOT VTI:    0.153 m  AORTA Ao Root diam: 2.90 cm MITRAL VALVE                TRICUSPID VALVE MV Area (PHT): 3.95 cm     TR Peak grad:   36.2 mmHg MV Decel Time: 192 msec     TR Vmax:        301.00 cm/s MV E velocity: 63.70 cm/s MV A velocity: 100.00 cm/s  SHUNTS MV E/A ratio:  0.64         Systemic VTI:  0.15 m                             Systemic Diam: 2.00 cm Kirk Ruths MD Electronically signed by Kirk Ruths MD Signature Date/Time: 07/23/2020/12:11:05 PM    Final    DG FEMUR, MIN 2 VIEWS RIGHT  Result Date: 07/22/2020 CLINICAL DATA:  Status post fall. Right femur deformity. Initial encounter. EXAM: RIGHT FEMUR 2 VIEWS COMPARISON:  Plain films right femur 06/23/2020 and 02/26/2018. FINDINGS: Plate and screws for fixation of a distal femur fracture are again seen. Has seen on the most recent  examination, there is no bridging bone across the metaphysis of the femur. Two of the most distal screws are broken and the distal end of the plate is laterally and inferiorly displaced, new since the 2020 exam but unchanged since the most recent exam. Position and alignment are unchanged since the most recent study. No new abnormality. Osteopenia noted. IMPRESSION: No acute finding. Fracture of the distal femur seen on the most recent examination is unchanged. Fixation hardware is in place. As seen on the most recent comparison, two of the distal most screws are broken and the distal aspect of the plate is laterally and inferiorly displaced. Electronically Signed  By: Inge Rise M.D.   On: 07/22/2020 11:38     TODAY-DAY OF DISCHARGE:  Subjective:   Tammey Deeg today has no headache,no chest abdominal pain,no new weakness tingling or numbness, feels much better wants to go home today.   Objective:   Blood pressure 96/64, pulse 95, temperature (!) 97.4 F (36.3 C), temperature source Oral, resp. rate 18, height 5\' 4"  (1.626 m), weight 61.8 kg, SpO2 93 %.  Intake/Output Summary (Last 24 hours) at 07/25/2020 1133 Last data filed at 07/25/2020 0600 Gross per 24 hour  Intake 677 ml  Output 1300 ml  Net -623 ml   Filed Weights   07/24/20 0011 07/24/20 1300 07/25/20 0436  Weight: 61 kg 61 kg 61.8 kg    Exam: Awake Alert, Oriented *3, No new F.N deficits, Normal affect [place].AT,PERRAL Supple Neck,No JVD, No cervical lymphadenopathy appriciated.  Symmetrical Chest wall movement, Good air movement bilaterally, CTAB RRR,No Gallops,Rubs or new Murmurs, No Parasternal Heave +ve B.Sounds, Abd Soft, Non tender, No organomegaly appriciated, No rebound -guarding or rigidity. No Cyanosis, Clubbing or edema, No new Rash or bruise   PERTINENT RADIOLOGIC STUDIES: No results found.   PERTINENT LAB RESULTS: CBC: Recent Labs    07/24/20 0409 07/25/20 0253  WBC 9.9 7.6  HGB 9.8* 10.0*   HCT 30.7* 31.0*  PLT 222 220   CMET CMP     Component Value Date/Time   NA 131 (L) 07/24/2020 0409   K 3.6 07/24/2020 0409   CL 99 07/24/2020 0409   CO2 23 07/24/2020 0409   GLUCOSE 107 (H) 07/24/2020 0409   BUN 19 07/24/2020 0409   CREATININE 0.60 07/24/2020 0409   CALCIUM 8.3 (L) 07/24/2020 0409   PROT 6.6 07/23/2020 0608   ALBUMIN 2.8 (L) 07/23/2020 0608   AST 24 07/23/2020 0608   ALT 14 07/23/2020 0608   ALKPHOS 99 07/23/2020 0608   BILITOT 0.9 07/23/2020 0608   GFRNONAA >60 07/24/2020 0409   GFRAA >60 02/02/2019 0855    GFR Estimated Creatinine Clearance: 40.4 mL/min (by C-G formula based on SCr of 0.6 mg/dL). No results for input(s): LIPASE, AMYLASE in the last 72 hours. No results for input(s): CKTOTAL, CKMB, CKMBINDEX, TROPONINI in the last 72 hours. Invalid input(s): POCBNP No results for input(s): DDIMER in the last 72 hours. No results for input(s): HGBA1C in the last 72 hours. No results for input(s): CHOL, HDL, LDLCALC, TRIG, CHOLHDL, LDLDIRECT in the last 72 hours. Recent Labs    07/22/20 1457  TSH 1.512   Recent Labs    07/22/20 1532  VITAMINB12 3,338*   Coags: No results for input(s): INR in the last 72 hours.  Invalid input(s): PT Microbiology: Recent Results (from the past 240 hour(s))  Urine Culture     Status: Abnormal   Collection Time: 07/22/20  2:42 PM   Specimen: Urine, Clean Catch  Result Value Ref Range Status   Specimen Description URINE, CLEAN CATCH  Final   Special Requests   Final    NONE Performed at [facility], [place] [address], [place], [place] [address]    Culture 80,000 COLONIES/mL ESCHERICHIA COLI (A)  Final   Report Status 07/24/2020 FINAL  Final   Organism ID, Bacteria ESCHERICHIA COLI (A)  Final      Susceptibility   Escherichia coli - MIC*    AMPICILLIN <=2 SENSITIVE Sensitive     CEFAZOLIN <=4 SENSITIVE Sensitive     CEFEPIME <=0.12 SENSITIVE Sensitive     CEFTRIAXONE <=  0.25 SENSITIVE Sensitive      CIPROFLOXACIN <=0.25 SENSITIVE Sensitive     GENTAMICIN <=1 SENSITIVE Sensitive     IMIPENEM <=0.25 SENSITIVE Sensitive     NITROFURANTOIN <=16 SENSITIVE Sensitive     TRIMETH/SULFA <=20 SENSITIVE Sensitive     AMPICILLIN/SULBACTAM <=2 SENSITIVE Sensitive     PIP/TAZO <=4 SENSITIVE Sensitive     * 80,000 COLONIES/mL ESCHERICHIA COLI  SARS CORONAVIRUS 2 (TAT 6-24 HRS) Nasopharyngeal Nasopharyngeal Swab     Status: None   Collection Time: 07/22/20  4:03 PM   Specimen: Nasopharyngeal Swab  Result Value Ref Range Status   SARS Coronavirus 2 NEGATIVE NEGATIVE Final    Comment: (NOTE) SARS-CoV-2 target nucleic acids are NOT DETECTED.  The SARS-CoV-2 RNA is generally detectable in upper and lower respiratory specimens during the acute phase of infection. Negative results do not preclude SARS-CoV-2 infection, do not rule out co-infections with other pathogens, and should not be used as the sole basis for treatment or other patient management decisions. Negative results must be combined with clinical observations, patient history, and epidemiological information. The expected result is Negative.  Fact Sheet for Patients: SugarRoll.be  Fact Sheet for Healthcare Providers: https://www.woods-mathews.com/  This test is not yet approved or cleared by the [place] FDA and  has been authorized for detection and/or diagnosis of SARS-CoV-2 by FDA under an Emergency Use Authorization (EUA). This EUA will remain  in effect (meaning this test can be used) for the duration of the COVID-19 declaration under Se ction 564(b)(1) of the Act, 21 U.S.C. section 360bbb-3(b)(1), unless the authorization is terminated or revoked sooner.  Performed at [facility], [place] [address] [place], [place] [address]     FURTHER DISCHARGE INSTRUCTIONS:  Get Medicines reviewed and adjusted: Please take all your medications with you for your next visit with your  Primary MD  Laboratory/radiological data: Please request your Primary MD to go over all hospital tests and procedure/radiological results at the follow up, please ask your Primary MD to get all Hospital records sent to his/her office.  In some cases, they will be blood work, cultures and biopsy results pending at the time of your discharge. Please request that your primary care M.D. goes through all the records of your hospital data and follows up on these results.  Also Note the following: If you experience worsening of your admission symptoms, develop shortness of breath, life threatening emergency, suicidal or homicidal thoughts you must seek medical attention immediately by calling 911 or calling your MD immediately  if symptoms less severe.  You must read complete instructions/literature along with all the possible adverse reactions/side effects for all the Medicines you take and that have been prescribed to you. Take any new Medicines after you have completely understood and accpet all the possible adverse reactions/side effects.   Do not drive when taking Pain medications or sleeping medications (Benzodaizepines)  Do not take more than prescribed Pain, Sleep and Anxiety Medications. It is not advisable to combine anxiety,sleep and pain medications without talking with your primary care practitioner  Special Instructions: If you have smoked or chewed Tobacco  in the last 2 yrs please stop smoking, stop any regular Alcohol  and or any Recreational drug use.  Wear Seat belts while driving.  Please note: You were cared for by a hospitalist during your hospital stay. Once you are discharged, your primary care physician will handle any further medical issues. Please note that NO REFILLS for any discharge medications will  be authorized once you are discharged, as it is imperative that you return to your primary care physician (or establish a relationship with a primary care physician if you do  not have one) for your post hospital discharge needs so that they can reassess your need for medications and monitor your lab values.  Total Time spent coordinating discharge including counseling, education and face to face time equals 35 minutes.  SignedOren Binet 07/25/2020 11:33 AM

## 2020-07-25 NOTE — [place] FL2 (Signed)
MEDICAID FL2 LEVEL OF CARE SCREENING TOOL     IDENTIFICATION  Patient Name: Stephanie Donovan Birthdate: [birth year] Sex: female Admission Date (Current Location): 07/22/2020  [facility] and [place] Number:  Herbalist and Address:  [facility], [place] [address], [place], [place] [address]      Provider Number: [id number]  Attending Physician Name and Address:  Jonetta Osgood, MD  Relative Name and Phone Number:  Wess Botts, [phone]    Current Level of Care: Hospital Recommended Level of Care: [place] Prior Approval Number:    Date Approved/Denied:   PASRR Number:    Discharge Plan: Other (Comment) (ALF)    Current Diagnoses: Patient Active Problem List   Diagnosis Date Noted   Supracondylar fracture of right femur, closed, initial encounter ([place]) 01/01/2018   Closed fracture of right distal femur ([place]) 12/24/2017   Acute blood loss anemia 12/19/2017   Malnutrition of moderate degree 12/18/2017   Femur fracture ([place]) 12/17/2017   Fall 12/17/2017   HLD (hyperlipidemia) 12/17/2017   Chronic anemia 12/17/2017   Closed 4-part fracture of proximal humerus, right, initial encounter 10/09/2017   Paroxysmal A-fib ([place]) 09/21/2017   Closed displaced fracture of right femoral neck ([place]) 09/21/2017   Closed fracture of right proximal humerus 09/21/2017   Dehydration 09/21/2017   Hyponatremia 09/21/2017   Elevated CK 09/21/2017   Thyroid nodule 09/21/2017   Closed right hip fracture ([place]) 09/21/2017   Chronic diastolic CHF (congestive heart failure) ([place]) 09/21/2017   Sinus tachycardia 09/21/2017   Acute lower UTI 09/21/2017   TIA (transient ischemic attack) 08/25/2014   Aphasia 08/25/2014   Chronic constipation 05/19/2014   Fracture of multiple pubic rami ([place]) 04/11/2014   Pubic bone fracture ([place]) 04/11/2014   Left hip pain 04/11/2014   Atrial fibrillation with RVR ([place]) 03/28/2013   Diarrhea  03/28/2013   Hypothyroidism 03/24/2013   Syncope 03/24/2013   Glaucoma 03/24/2013   Hypokalemia 03/24/2013   Acute gastroenteritis 03/24/2013    Orientation RESPIRATION BLADDER Height & Weight     Self, Time  Normal Continent Weight: 136 lb 3.9 oz (61.8 kg) Height:  5\' 4"  (162.6 cm)  BEHAVIORAL SYMPTOMS/MOOD NEUROLOGICAL BOWEL NUTRITION STATUS      Continent Diet (See DC summary)  AMBULATORY STATUS COMMUNICATION OF NEEDS Skin   Limited Assist Verbally Skin abrasions (R leg abraision)                       Personal Care Assistance Level of Assistance  Bathing, Feeding, Dressing Bathing Assistance: Limited assistance Feeding assistance: Independent Dressing Assistance: Limited assistance     Functional Limitations Info  Sight, Hearing, Speech Sight Info: Adequate Hearing Info: Adequate Speech Info: Adequate    SPECIAL CARE FACTORS FREQUENCY                       Contractures Contractures Info: Not present    Additional Factors Info  Code Status, Allergies Code Status Info: DNR Allergies Info: Aspirin   Epinephrine   Penicillins   Codeine           Current Medications (07/25/2020):  This is the current hospital active medication list Current Facility-Administered Medications  Medication Dose Route Frequency Provider Last Rate Last Admin   acetaminophen (TYLENOL) tablet 650 mg  650 mg Oral Q6H PRN Thurnell Lose, MD   650 mg at 07/23/20 0532   Or   acetaminophen (TYLENOL)  suppository 650 mg  650 mg Rectal Q6H PRN Thurnell Lose, MD       aspirin EC tablet 81 mg  81 mg Oral Daily Thurnell Lose, MD   81 mg at 07/25/20 0926   brimonidine (ALPHAGAN) 0.15 % ophthalmic solution 1 drop  1 drop Both Eyes TID Thurnell Lose, MD   1 drop at 07/25/20 0931   cefdinir (OMNICEF) capsule 300 mg  300 mg Oral Q12H Jonetta Osgood, MD   300 mg at 07/25/20 0926   docusate sodium (COLACE) capsule 100 mg  100 mg Oral Daily Thurnell Lose, MD   100 mg at  07/25/20 0926   heparin injection 5,000 Units  5,000 Units Subcutaneous Q8H Thurnell Lose, MD   5,000 Units at 07/25/20 3664   levothyroxine (SYNTHROID) tablet 50 mcg  50 mcg Oral QAC breakfast Thurnell Lose, MD   50 mcg at 07/25/20 4034   magnesium oxide (MAG-OX) tablet 400 mg  400 mg Oral BID Thurnell Lose, MD   400 mg at 07/25/20 0926   metoprolol tartrate (LOPRESSOR) injection 5 mg  5 mg Intravenous Q8H PRN Thurnell Lose, MD       metoprolol tartrate (LOPRESSOR) tablet 12.5 mg  12.5 mg Oral BID Hall, Carole N, DO   12.5 mg at 07/25/20 0926   ondansetron (ZOFRAN) tablet 4 mg  4 mg Oral Q6H PRN Thurnell Lose, MD       Or   ondansetron (ZOFRAN) injection 4 mg  4 mg Intravenous Q6H PRN Thurnell Lose, MD       polyethylene glycol (MIRALAX / GLYCOLAX) packet 17 g  17 g Oral Daily PRN Thurnell Lose, MD       timolol (TIMOPTIC) 0.25 % ophthalmic solution 1 drop  1 drop Both Eyes BID Thurnell Lose, MD   1 drop at 07/25/20 0925   traMADol (ULTRAM) tablet 50 mg  50 mg Oral Q6H PRN Thurnell Lose, MD       vitamin B-12 (CYANOCOBALAMIN) tablet 1,000 mcg  1,000 mcg Oral Daily Thurnell Lose, MD   1,000 mcg at 07/25/20 7425     Discharge Medications: Please see discharge summary for a list of discharge medications.  Relevant Imaging Results:  Relevant Lab Results:   Additional Information SS# [id number]  Coralee P[place], [place]

## 2020-07-25 NOTE — Progress Notes (Signed)
[facility] [phone]) Report been given, waiting for PTAR for discharge.

## 2020-07-25 NOTE — TOC Transition Note (Signed)
Transition of Care [facility]) - CM/SW Discharge Note   Patient Details  Name: Stephanie Donovan MRN: [id number] Date of Birth: 1927/12/09  [facility] CM/SW Contact:  Tresa Endo Phone Number: 07/25/2020, 11:56 AM   Clinical Narrative:    Patient will DC to: [facility] Anticipated DC date: 07/25/2020 Family notified: Pt Daughter Transport by: Corey Harold   Per MD patient ready for DC to [facility]. RN to call report prior to discharge [phone]). RN, patient, patient's family, and facility notified of DC. Discharge Summary and FL2 sent to facility. DC packet on chart. Ambulance transport requested for patient.   CSW will sign off for now as social work intervention is no longer needed. Please consult [place] again if new needs arise.           Patient Goals and CMS Choice        Discharge Placement                       Discharge Plan and Services                                     Social Determinants of Health (SDOH) Interventions     Readmission Risk Interventions No flowsheet data found.

## 2020-07-26 ENCOUNTER — Emergency Department (HOSPITAL_COMMUNITY)
Admission: EM | Admit: 2020-07-26 | Discharge: 2020-07-26 | Disposition: A | Discharge status: Home / Self Care | Payer: Medicare Other | Source: Home / Self Care | Attending: Emergency Medicine | Admitting: Emergency Medicine

## 2020-07-26 ENCOUNTER — Other Ambulatory Visit: Payer: Self-pay

## 2020-07-26 ENCOUNTER — Encounter (HOSPITAL_COMMUNITY): Payer: Self-pay

## 2020-07-26 DIAGNOSIS — I5032 Chronic diastolic (congestive) heart failure: Secondary | ICD-10-CM | POA: Insufficient documentation

## 2020-07-26 DIAGNOSIS — L03115 Cellulitis of right lower limb: Secondary | ICD-10-CM | POA: Diagnosis not present

## 2020-07-26 DIAGNOSIS — N3001 Acute cystitis with hematuria: Secondary | ICD-10-CM

## 2020-07-26 DIAGNOSIS — Z96641 Presence of right artificial hip joint: Secondary | ICD-10-CM | POA: Insufficient documentation

## 2020-07-26 DIAGNOSIS — E039 Hypothyroidism, unspecified: Secondary | ICD-10-CM | POA: Insufficient documentation

## 2020-07-26 DIAGNOSIS — T84620A Infection and inflammatory reaction due to internal fixation device of right femur, initial encounter: Secondary | ICD-10-CM | POA: Diagnosis not present

## 2020-07-26 DIAGNOSIS — Z96611 Presence of right artificial shoulder joint: Secondary | ICD-10-CM | POA: Insufficient documentation

## 2020-07-26 DIAGNOSIS — R000 Tachycardia, unspecified: Secondary | ICD-10-CM | POA: Insufficient documentation

## 2020-07-26 LAB — COMPREHENSIVE METABOLIC PANEL
ALT: 19 U/L (ref 0–44)
AST: 18 U/L (ref 15–41)
Albumin: 2.5 g/dL — ABNORMAL LOW (ref 3.5–5.0)
Alkaline Phosphatase: 77 U/L (ref 38–126)
Anion gap: 7 (ref 5–15)
BUN: 11 mg/dL (ref 8–23)
CO2: 26 mmol/L (ref 22–32)
Calcium: 8.3 mg/dL — ABNORMAL LOW (ref 8.9–10.3)
Chloride: 100 mmol/L (ref 98–111)
Creatinine, Ser: 0.54 mg/dL (ref 0.44–1.00)
GFR, Estimated: >60 mL/min (ref >60)
Glucose, Bld: 106 mg/dL — ABNORMAL HIGH (ref 70–99)
Potassium: 4.1 mmol/L (ref 3.5–5.1)
Sodium: 133 mmol/L — ABNORMAL LOW (ref 135–145)
Total Bilirubin: 0.8 mg/dL (ref 0.3–1.2)
Total Protein: 5.7 g/dL — ABNORMAL LOW (ref 6.5–8.1)

## 2020-07-26 LAB — CBC WITH DIFFERENTIAL/PLATELET
Abs Immature Granulocytes: 0.03 10*3/uL (ref 0.00–0.07)
Basophils Absolute: 0.1 10*3/uL (ref 0.0–0.1)
Basophils Relative: 2 %
Eosinophils Absolute: 0.1 10*3/uL (ref 0.0–0.5)
Eosinophils Relative: 2 %
HCT: 31.2 % — ABNORMAL LOW (ref 36.0–46.0)
Hemoglobin: 10 g/dL — ABNORMAL LOW (ref 12.0–15.0)
Immature Granulocytes: 0 %
Lymphocytes Relative: 20 %
Lymphs Abs: 1.4 10*3/uL (ref 0.7–4.0)
MCH: 30.2 pg (ref 26.0–34.0)
MCHC: 32.1 g/dL (ref 30.0–36.0)
MCV: 94.3 fL (ref 80.0–100.0)
Monocytes Absolute: 0.9 10*3/uL (ref 0.1–1.0)
Monocytes Relative: 13 %
Neutro Abs: 4.4 10*3/uL (ref 1.7–7.7)
Neutrophils Relative %: 63 %
Platelets: 248 10*3/uL (ref 150–400)
RBC: 3.31 MIL/uL — ABNORMAL LOW (ref 3.87–5.11)
RDW: 14.6 % (ref 11.5–15.5)
WBC: 6.9 10*3/uL (ref 4.0–10.5)
nRBC: 0 % (ref 0.0–0.2)

## 2020-07-26 LAB — URINALYSIS, ROUTINE W REFLEX MICROSCOPIC
Bilirubin Urine: NEGATIVE
Glucose, UA: NEGATIVE mg/dL
Ketones, ur: NEGATIVE mg/dL
Leukocytes,Ua: NEGATIVE
Nitrite: NEGATIVE
Protein, ur: NEGATIVE mg/dL
Specific Gravity, Urine: 1.01 (ref 1.005–1.030)
pH: 8 (ref 5.0–8.0)

## 2020-07-26 LAB — LACTIC ACID, PLASMA: Lactic Acid, Venous: 1 mmol/L (ref 0.5–1.9)

## 2020-07-26 MED ORDER — SODIUM CHLORIDE 0.9 % IV SOLN
1.0000 g | Freq: Once | INTRAVENOUS | Status: AC
  Administered 2020-07-26: 1 g via INTRAVENOUS
  Filled 2020-07-26: qty 10

## 2020-07-26 MED ORDER — SODIUM CHLORIDE 0.9 % IV BOLUS
500.0000 mL | Freq: Once | INTRAVENOUS | Status: AC
  Administered 2020-07-26: 500 mL via INTRAVENOUS

## 2020-07-26 NOTE — ED Provider Notes (Signed)
Emergency Department Provider Note   I have reviewed the triage vital signs and the nursing notes.   HISTORY  Chief Complaint Fever   HPI Stephanie Donovan is a 87 y.o. female with past medical history reviewed below including discharge from the hospital yesterday to a nursing facility.  Patient left with evidence of a urinary tract infection growing E. Coli on cultures reviewed here.  Staff at the facility report fever today and sent the patient back to the department.  According to EMS, staff there reported that they had not started the antibiotic because it was not clear what she was taking this for.  The patient denies any complaints at this time such as pain, dysuria, shortness of breath.   Past Medical History:  Diagnosis Date   Blood transfusion without reported diagnosis    Chronic back pain    Chronic constipation    Dysrhythmia    Afib with RVR - 2015, Oaroxyomal Afib   GI bleed    Glaucoma    Syncope 2015   Thyroid disease    TIA (transient ischemic attack) 2015    Patient Active Problem List   Diagnosis Date Noted   Hardware complicating wound infection ([place])    Cellulitis 07/27/2020   Supracondylar fracture of right femur, closed, initial encounter ([place]) 01/01/2018   Closed fracture of right distal femur ([place]) 12/24/2017   Acute blood loss anemia 12/19/2017   Malnutrition of moderate degree 12/18/2017   Femur fracture ([place]) 12/17/2017   Fall 12/17/2017   HLD (hyperlipidemia) 12/17/2017   Chronic anemia 12/17/2017   Closed 4-part fracture of proximal humerus, right, initial encounter 10/09/2017   Paroxysmal A-fib ([place]) 09/21/2017   Closed displaced fracture of right femoral neck ([place]) 09/21/2017   Closed fracture of right proximal humerus 09/21/2017   Dehydration 09/21/2017   Hyponatremia 09/21/2017   Elevated CK 09/21/2017   Thyroid nodule 09/21/2017   Closed right hip fracture ([place]) 09/21/2017   Chronic diastolic CHF (congestive heart failure)  ([place]) 09/21/2017   Sinus tachycardia 09/21/2017   Acute lower UTI 09/21/2017   TIA (transient ischemic attack) 08/25/2014   Aphasia 08/25/2014   Chronic constipation 05/19/2014   Fracture of multiple pubic rami ([place]) 04/11/2014   Pubic bone fracture ([place]) 04/11/2014   Left hip pain 04/11/2014   Atrial fibrillation with RVR ([place]) 03/28/2013   Diarrhea 03/28/2013   Hypothyroidism 03/24/2013   Syncope 03/24/2013   Glaucoma 03/24/2013   Hypokalemia 03/24/2013   Acute gastroenteritis 03/24/2013    Past Surgical History:  Procedure Laterality Date   CATARACT EXTRACTION, BILATERAL     COLONOSCOPY     HIP ARTHROPLASTY Right 09/22/2017   Procedure: ARTHROPLASTY BIPOLAR HIP (HEMIARTHROPLASTY);  Surgeon: [name], MD;  Location: [facility];  Service: Orthopedics;  Laterality: Right;   JOINT REPLACEMENT  11/20/2011   LTKR   ORIF FEMUR FRACTURE Right 12/20/2017   Procedure: OPEN REDUCTION INTERNAL FIXATION (ORIF) DISTAL FEMUR FRACTURE;  Surgeon: [name], MD;  Location: [facility];  Service: Orthopedics;  Laterality: Right;   REVERSE SHOULDER ARTHROPLASTY Right 10/09/2017   REVERSE SHOULDER ARTHROPLASTY Right 10/09/2017   Procedure: REVERSE SHOULDER ARTHROPLASTY;  Surgeon: [name], MD;  Location: [facility];  Service: Orthopedics;  Laterality: Right;    Allergies Aspirin, Epinephrine, Penicillins, and Codeine  Family History  Problem Relation Age of Onset   Osteoporosis Mother    Cancer - Colon Father    Schizophrenia Son     Social History Social History  Tobacco Use   Smoking status: Never   Smokeless tobacco: Never  Vaping Use   Vaping Use: Never used  Substance Use Topics   Alcohol use: No   Drug use: No    Review of Systems  Constitutional: Staff report of fever.  Eyes: No visual changes. ENT: No sore throat. Cardiovascular: Denies chest pain. Respiratory: Denies shortness of breath. Gastrointestinal: No abdominal pain.  No nausea, no vomiting.  No  diarrhea.  No constipation. Genitourinary: Negative for dysuria. Musculoskeletal: Negative for back pain. Skin: Negative for rash. Neurological: Negative for headaches, focal weakness or numbness.  10-point ROS otherwise negative.  ____________________________________________   PHYSICAL EXAM:  VITAL SIGNS: ED Triage Vitals  Enc Vitals Group     BP 07/26/20 1207 (!) 144/90     Pulse Rate 07/26/20 1207 (!) 104     Resp 07/26/20 1207 (!) 29     Temp 07/26/20 1207 98 F (36.7 C)     Temp Source 07/26/20 1207 Rectal     SpO2 07/26/20 1159 96 %    Constitutional: Alert and oriented. Well appearing and in no acute distress. Eyes: Conjunctivae are normal.  Head: Atraumatic. Nose: No congestion/rhinnorhea. Mouth/Throat: Mucous membranes are moist. Neck: No stridor.  Cardiovascular: Tachycardia. Good peripheral circulation. Grossly normal heart sounds.   Respiratory: Normal respiratory effort.  No retractions. Lungs CTAB. Gastrointestinal: Soft and nontender. No distention.  Musculoskeletal: No lower extremity tenderness nor edema. No gross deformities of extremities. Neurologic:  Normal speech and language. No gross focal neurologic deficits are appreciated.  Skin:  Skin is warm, dry and intact. No rash noted.   ____________________________________________   LABS (all labs ordered are listed, but only abnormal results are displayed)  Labs Reviewed  URINE CULTURE - Abnormal; Notable for the following components:      Result Value   Culture MULTIPLE SPECIES PRESENT, SUGGEST RECOLLECTION (*)    All other components within normal limits  COMPREHENSIVE METABOLIC PANEL - Abnormal; Notable for the following components:   Sodium 133 (*)    Glucose, Bld 106 (*)    Calcium 8.3 (*)    Total Protein 5.7 (*)    Albumin 2.5 (*)    All other components within normal limits  CBC WITH DIFFERENTIAL/PLATELET - Abnormal; Notable for the following components:   RBC 3.31 (*)    Hemoglobin  10.0 (*)    HCT 31.2 (*)    All other components within normal limits  URINALYSIS, ROUTINE W REFLEX MICROSCOPIC - Abnormal; Notable for the following components:   APPearance HAZY (*)    Hgb urine dipstick MODERATE (*)    Bacteria, UA MANY (*)    All other components within normal limits  LACTIC ACID, PLASMA   ____________________________________________  EKG   EKG Interpretation  Date/Time:  Tuesday July 26 2020 11:56:52 EDT Ventricular Rate:  92 PR Interval:  128 QRS Duration: 87 QT Interval:  398 QTC Calculation: 493 R Axis:   51 Text Interpretation: Sinus rhythm Atrial premature complexes Low voltage, precordial leads RSR' in V1 or V2, probably normal variant Borderline prolonged QT interval Confirmed by Nanda Quinton [phone]) on 07/26/2020 12:55:01 PM         ____________________________________________  RADIOLOGY  Imaging reviewed.   ____________________________________________   PROCEDURES  Procedure(s) performed:   Procedures  None ____________________________________________   INITIAL IMPRESSION / ASSESSMENT AND PLAN / ED COURSE  Pertinent labs & imaging results that were available during my care of the patient were reviewed by me and  considered in my medical decision making (see chart for details).   Patient presents to the emergency department with fever from her nursing facility.  She is awake and alert.  On arrival she is afebrile and has normal blood pressure.  Mild tachycardia on my exam but patient appears well perfused.  She is conversational.  Review of her prior admission discharge summary and lab work shows a urine culture growing E. coli sensitive to multiple antibiotics including Rocephin.  Report from EMS is that patient may not have received her antibiotics yesterday and so we will give an IV Rocephin dose here.  Doubt sepsis but will obtain blood cultures, lactate, labs, IV fluids, and reassess.    ____________________________________________  FINAL CLINICAL IMPRESSION(S) / ED DIAGNOSES  Final diagnoses:  Acute cystitis with hematuria     MEDICATIONS GIVEN DURING THIS VISIT:  Medications  sodium chloride 0.9 % bolus 500 mL (0 mLs Intravenous Stopped 07/26/20 1412)  cefTRIAXone (ROCEPHIN) 1 g in sodium chloride 0.9 % 100 mL IVPB (0 g Intravenous Stopped 07/26/20 1344)     NEW OUTPAT Note:  This document was prepared using Dragon voice recognition software and may include unintentional dictation errors.  Nanda Quinton, MD, [facility] Emergency Medicine    Keanu Lesniak, [facility], MD 07/31/20 [phone]

## 2020-07-26 NOTE — ED Notes (Signed)
PTAR was called

## 2020-07-26 NOTE — Discharge Instructions (Addendum)
You were seen in the emergency department today with fever.  You were diagnosed in the hospital yesterday with a urinary tract infection.  This is the source of your fever.  They discharged you home with antibiotics [name]) which you should take for this.  We gave an extra dose of IV antibiotics here.  Your lab work is reassuring.  Please continue your treatments and return to the emergency department any new or suddenly worsening symptoms.

## 2020-07-26 NOTE — ED Notes (Signed)
Meal tray ordered 

## 2020-07-26 NOTE — ED Triage Notes (Signed)
Pt BIB [facility] from [facility] [address]. Pt was just discharged from here back to facility with Heart Failure. Per nursing home pt was was sent back with antibiotics but unsure why so they did not give them to the pt. Pt with a fever of 102 this morning and various wounds on the leg per facility unsure how they got there but they felt like pt needed to be seen. Pt is alert and oriented x3. Pt denies any pain at this time.

## 2020-07-27 ENCOUNTER — Inpatient Hospital Stay (HOSPITAL_COMMUNITY)
Admission: EM | Admit: 2020-07-27 | Discharge: 2020-08-04 | DRG: 560 | Disposition: A | Discharge status: Skilled Nursing Facility | Payer: Medicare Other | Source: Skilled Nursing Facility | Attending: Internal Medicine | Admitting: Internal Medicine

## 2020-07-27 ENCOUNTER — Emergency Department (HOSPITAL_COMMUNITY): Payer: Medicare Other

## 2020-07-27 DIAGNOSIS — Z8673 Personal history of transient ischemic attack (TIA), and cerebral infarction without residual deficits: Secondary | ICD-10-CM

## 2020-07-27 DIAGNOSIS — Z79890 Hormone replacement therapy (postmenopausal): Secondary | ICD-10-CM | POA: Diagnosis not present

## 2020-07-27 DIAGNOSIS — Z818 Family history of other mental and behavioral disorders: Secondary | ICD-10-CM | POA: Diagnosis not present

## 2020-07-27 DIAGNOSIS — I5032 Chronic diastolic (congestive) heart failure: Secondary | ICD-10-CM | POA: Diagnosis present

## 2020-07-27 DIAGNOSIS — L03115 Cellulitis of right lower limb: Secondary | ICD-10-CM | POA: Diagnosis not present

## 2020-07-27 DIAGNOSIS — I472 Ventricular tachycardia: Secondary | ICD-10-CM | POA: Diagnosis not present

## 2020-07-27 DIAGNOSIS — F0390 Unspecified dementia without behavioral disturbance: Secondary | ICD-10-CM | POA: Diagnosis present

## 2020-07-27 DIAGNOSIS — I110 Hypertensive heart disease with heart failure: Secondary | ICD-10-CM | POA: Diagnosis present

## 2020-07-27 DIAGNOSIS — T84620A Infection and inflammatory reaction due to internal fixation device of right femur, initial encounter: Secondary | ICD-10-CM | POA: Diagnosis present

## 2020-07-27 DIAGNOSIS — E871 Hypo-osmolality and hyponatremia: Secondary | ICD-10-CM | POA: Diagnosis present

## 2020-07-27 DIAGNOSIS — D6489 Other specified anemias: Secondary | ICD-10-CM | POA: Diagnosis present

## 2020-07-27 DIAGNOSIS — Z7189 Other specified counseling: Secondary | ICD-10-CM | POA: Diagnosis not present

## 2020-07-27 DIAGNOSIS — Y832 Surgical operation with anastomosis, bypass or graft as the cause of abnormal reaction of the patient, or of later complication, without mention of misadventure at the time of the procedure: Secondary | ICD-10-CM | POA: Diagnosis present

## 2020-07-27 DIAGNOSIS — Z66 Do not resuscitate: Secondary | ICD-10-CM | POA: Diagnosis present

## 2020-07-27 DIAGNOSIS — Z515 Encounter for palliative care: Secondary | ICD-10-CM | POA: Diagnosis not present

## 2020-07-27 DIAGNOSIS — Z7982 Long term (current) use of aspirin: Secondary | ICD-10-CM | POA: Diagnosis not present

## 2020-07-27 DIAGNOSIS — T847XXA Infection and inflammatory reaction due to other internal orthopedic prosthetic devices, implants and grafts, initial encounter: Secondary | ICD-10-CM

## 2020-07-27 DIAGNOSIS — L0390 Cellulitis, unspecified: Secondary | ICD-10-CM | POA: Diagnosis present

## 2020-07-27 DIAGNOSIS — Z96611 Presence of right artificial shoulder joint: Secondary | ICD-10-CM | POA: Diagnosis present

## 2020-07-27 DIAGNOSIS — T847XXD Infection and inflammatory reaction due to other internal orthopedic prosthetic devices, implants and grafts, subsequent encounter: Secondary | ICD-10-CM | POA: Diagnosis not present

## 2020-07-27 DIAGNOSIS — E039 Hypothyroidism, unspecified: Secondary | ICD-10-CM | POA: Diagnosis present

## 2020-07-27 DIAGNOSIS — N390 Urinary tract infection, site not specified: Secondary | ICD-10-CM | POA: Diagnosis present

## 2020-07-27 DIAGNOSIS — Z993 Dependence on wheelchair: Secondary | ICD-10-CM

## 2020-07-27 DIAGNOSIS — E538 Deficiency of other specified B group vitamins: Secondary | ICD-10-CM | POA: Diagnosis present

## 2020-07-27 DIAGNOSIS — E785 Hyperlipidemia, unspecified: Secondary | ICD-10-CM | POA: Diagnosis present

## 2020-07-27 DIAGNOSIS — Z96641 Presence of right artificial hip joint: Secondary | ICD-10-CM | POA: Diagnosis present

## 2020-07-27 DIAGNOSIS — Z79899 Other long term (current) drug therapy: Secondary | ICD-10-CM | POA: Diagnosis not present

## 2020-07-27 DIAGNOSIS — Z20822 Contact with and (suspected) exposure to covid-19: Secondary | ICD-10-CM | POA: Diagnosis present

## 2020-07-27 LAB — CBC WITH DIFFERENTIAL/PLATELET
Abs Immature Granulocytes: 0.03 10*3/uL (ref 0.00–0.07)
Basophils Absolute: 0.1 10*3/uL (ref 0.0–0.1)
Basophils Relative: 1 %
Eosinophils Absolute: 0.2 10*3/uL (ref 0.0–0.5)
Eosinophils Relative: 2 %
HCT: 33.8 % — ABNORMAL LOW (ref 36.0–46.0)
Hemoglobin: 10.7 g/dL — ABNORMAL LOW (ref 12.0–15.0)
Immature Granulocytes: 0 %
Lymphocytes Relative: 17 %
Lymphs Abs: 1.4 10*3/uL (ref 0.7–4.0)
MCH: 29.5 pg (ref 26.0–34.0)
MCHC: 31.7 g/dL (ref 30.0–36.0)
MCV: 93.1 fL (ref 80.0–100.0)
Monocytes Absolute: 1 10*3/uL (ref 0.1–1.0)
Monocytes Relative: 12 %
Neutro Abs: 5.6 10*3/uL (ref 1.7–7.7)
Neutrophils Relative %: 68 %
Platelets: 263 10*3/uL (ref 150–400)
RBC: 3.63 MIL/uL — ABNORMAL LOW (ref 3.87–5.11)
RDW: 14.3 % (ref 11.5–15.5)
WBC: 8.3 10*3/uL (ref 4.0–10.5)
nRBC: 0 % (ref 0.0–0.2)

## 2020-07-27 LAB — BASIC METABOLIC PANEL
Anion gap: 8 (ref 5–15)
BUN: 9 mg/dL (ref 8–23)
CO2: 27 mmol/L (ref 22–32)
Calcium: 8.6 mg/dL — ABNORMAL LOW (ref 8.9–10.3)
Chloride: 99 mmol/L (ref 98–111)
Creatinine, Ser: 0.57 mg/dL (ref 0.44–1.00)
GFR, Estimated: >60 mL/min (ref >60)
Glucose, Bld: 106 mg/dL — ABNORMAL HIGH (ref 70–99)
Potassium: 4.1 mmol/L (ref 3.5–5.1)
Sodium: 134 mmol/L — ABNORMAL LOW (ref 135–145)

## 2020-07-27 LAB — URINE CULTURE

## 2020-07-27 LAB — RESP PANEL BY RT-PCR (FLU A&B, COVID) ARPGX2
Influenza A by PCR: NEGATIVE
Influenza B by PCR: NEGATIVE
SARS Coronavirus 2 by RT PCR: NEGATIVE

## 2020-07-27 LAB — LACTIC ACID, PLASMA
Lactic Acid, Venous: 1 mmol/L (ref 0.5–1.9)
Lactic Acid, Venous: 1.1 mmol/L (ref 0.5–1.9)

## 2020-07-27 LAB — CREATININE, SERUM
Creatinine, Ser: 0.52 mg/dL (ref 0.44–1.00)
GFR, Estimated: >60 mL/min (ref >60)

## 2020-07-27 MED ORDER — SODIUM CHLORIDE 0.9 % IV SOLN
2.0000 g | INTRAVENOUS | Status: DC
  Administered 2020-07-28 – 2020-08-02 (×6): 2 g via INTRAVENOUS
  Filled 2020-07-27: qty 2
  Filled 2020-07-27 (×2): qty 20
  Filled 2020-07-27 (×4): qty 2

## 2020-07-27 MED ORDER — LACTATED RINGERS IV SOLN: INTRAVENOUS | Status: AC

## 2020-07-27 MED ORDER — ADULT MULTIVITAMIN W/MINERALS CH
1.0000 | ORAL_TABLET | Freq: Every day | ORAL | Status: DC
  Administered 2020-07-27 – 2020-08-04 (×9): 1 via ORAL
  Filled 2020-07-27 (×9): qty 1

## 2020-07-27 MED ORDER — TIMOLOL MALEATE 0.25 % OP SOLN
1.0000 [drp] | Freq: Two times a day (BID) | OPHTHALMIC | Status: DC
  Administered 2020-07-28 – 2020-08-04 (×16): 1 [drp] via OPHTHALMIC
  Filled 2020-07-27: qty 5

## 2020-07-27 MED ORDER — ENOXAPARIN SODIUM 40 MG/0.4ML IJ SOSY
40.0000 mg | PREFILLED_SYRINGE | INTRAMUSCULAR | Status: DC
  Administered 2020-07-28 – 2020-08-04 (×8): 40 mg via SUBCUTANEOUS
  Filled 2020-07-27 (×8): qty 0.4

## 2020-07-27 MED ORDER — ACETAMINOPHEN 325 MG PO TABS: 650.0000 mg | ORAL_TABLET | Freq: Four times a day (QID) | ORAL | Status: DC | PRN

## 2020-07-27 MED ORDER — METOPROLOL SUCCINATE ER 25 MG PO TB24
12.5000 mg | ORAL_TABLET | Freq: Every day | ORAL | Status: DC
  Administered 2020-07-27 – 2020-07-30 (×4): 12.5 mg via ORAL
  Filled 2020-07-27 (×4): qty 1

## 2020-07-27 MED ORDER — ONDANSETRON HCL 4 MG/2ML IJ SOLN
4.0000 mg | Freq: Four times a day (QID) | INTRAMUSCULAR | Status: DC | PRN
  Administered 2020-07-29: 4 mg via INTRAVENOUS
  Filled 2020-07-27: qty 2

## 2020-07-27 MED ORDER — VITAMIN B-12 1000 MCG PO TABS: 1000.0000 ug | ORAL_TABLET | Freq: Every day | ORAL | Status: DC

## 2020-07-27 MED ORDER — LEVOTHYROXINE SODIUM 50 MCG PO TABS
50.0000 ug | ORAL_TABLET | Freq: Every day | ORAL | Status: DC
  Administered 2020-07-28 – 2020-08-04 (×8): 50 ug via ORAL
  Filled 2020-07-27 (×9): qty 1

## 2020-07-27 MED ORDER — SODIUM CHLORIDE 0.9 % IV SOLN
1.0000 g | Freq: Once | INTRAVENOUS | Status: AC
  Administered 2020-07-27: 1 g via INTRAVENOUS
  Filled 2020-07-27: qty 10

## 2020-07-27 MED ORDER — POLYETHYLENE GLYCOL 3350 17 G PO PACK: 17.0000 g | PACK | Freq: Every day | ORAL | Status: DC | PRN

## 2020-07-27 MED ORDER — MELATONIN 3 MG PO TABS: 3.0000 mg | ORAL_TABLET | Freq: Every evening | ORAL | Status: DC | PRN

## 2020-07-27 NOTE — Progress Notes (Signed)
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 Sharol Given to assume orthopedic management with likely surgery on Friday Dry dressing to right leg in the meantime with oral antibiotics.

## 2020-07-27 NOTE — ED Triage Notes (Signed)
Pt arrived via [facility]. Per EMS, pt was picked up at [facility] d/t concern over appearance of where rod was placed 2 years ago which appeared to be protruding at site. Pt alert per baseline per EMS. Pt arrived in no obvious distress but expressed pain in affected leg when transferring from EMS stretcher to bed. EMS also reports pt was evaluated in ED yesterday for fever.   130/80 BP 93 HR 96% SpO2

## 2020-07-27 NOTE — H&P (Addendum)
History and Physical  Stephanie Donovan DVV:[id number] DOB: 1927-12-28 DOA: 07/27/2020  Referring physician: Dr. Gilford Raid, [place]. PCP: System, Provider Not In  Outpatient Specialists: Orthopedic surgery. Patient coming from: Home.    Chief Complaint: Abnormality of the right knee.  HPI: Stephanie Donovan is a 87 y.o. female with medical history significant for dementia, hypothyroidism, hypertension who was recently discharged on 07/25/20 after being admitted for syncope and E. coli UTI, she presents with abnormality of her right distal femur, associated with hardware failure.  Has pain with movement.  Patient has a history of distal femur fracture nonunion.  She is nonambulatory and uses a wheelchair at baseline.  Per EDP, her daughter is requesting surgery to her right distal femur.  Right knee x-ray in the ED reveals interval worsening of surgical hardware failure with at least 2 cm lateral migration of plate and screw fixation along the distal femur.  Redemonstration of at least 1 fractured screw.  Refracture through the distal femoral shaft fracture and bone graft.  EDP discussed case with orthopedic surgery who recommended admission by Hospitalist team and for possible surgical intervention on Friday.  TRH was asked to admit.   ED Course:  Temperature 97.7.  BP 148/89, pulse 94, respiration rate 16, O2 saturation 96% on room air.  Lab studies essentially unremarkable except for hemoglobin 10.7, MCV 93, platelet count of 263 and WBC of 8.3.  Review of Systems: Review of systems as noted in the HPI. All other systems reviewed and are negative.   Past Medical History:  Diagnosis Date   Blood transfusion without reported diagnosis    Chronic back pain    Chronic constipation    Dysrhythmia    Afib with RVR - 2015, Oaroxyomal Afib   GI bleed    Glaucoma    Syncope 2015   Thyroid disease    TIA (transient ischemic attack) 2015   Past Surgical History:  Procedure Laterality Date    CATARACT EXTRACTION, BILATERAL     COLONOSCOPY     HIP ARTHROPLASTY Right 09/22/2017   Procedure: ARTHROPLASTY BIPOLAR HIP (HEMIARTHROPLASTY);  Surgeon: [name], MD;  Location: [facility];  Service: Orthopedics;  Laterality: Right;   JOINT REPLACEMENT  11/20/2011   LTKR   ORIF FEMUR FRACTURE Right 12/20/2017   Procedure: OPEN REDUCTION INTERNAL FIXATION (ORIF) DISTAL FEMUR FRACTURE;  Surgeon: [name], MD;  Location: [facility];  Service: Orthopedics;  Laterality: Right;   REVERSE SHOULDER ARTHROPLASTY Right 10/09/2017   REVERSE SHOULDER ARTHROPLASTY Right 10/09/2017   Procedure: REVERSE SHOULDER ARTHROPLASTY;  Surgeon: [name], MD;  Location: [facility];  Service: Orthopedics;  Laterality: Right;    Social History:  reports that she has never smoked. She has never used smokeless tobacco. She reports that she does not drink alcohol and does not use drugs.   Allergies  Allergen Reactions   Aspirin Palpitations and Other (See Comments)    Unspecified Mixed reactions   Epinephrine Other (See Comments)    Rapped heart beat   Penicillins Other (See Comments)    Lost hearing temporarily DID THE REACTION INVOLVE: Swelling of the face/tongue/throat, SOB, or low BP? No Sudden or severe rash/hives, skin peeling, or the inside of the mouth or nose? No Did it require medical treatment? No When did it last happen?       If all above answers are "NO", may proceed with cephalosporin use.    Codeine Palpitations    Family History  Problem Relation Age  of Onset   Osteoporosis Mother    Cancer - Colon Father    Schizophrenia Son       Prior to Admission medications   Medication Sig Start Date End Date Taking? Authorizing Provider  acetaminophen (TYLENOL) 500 MG tablet Take 500 mg by mouth in the morning and at bedtime.    [provider]  aspirin EC 81 MG tablet Take 81 mg by mouth daily.    [provider]  brimonidine (ALPHAGAN P) 0.1 % SOLN Place 1 drop into  the left eye in the morning and at bedtime.    [provider]  brinzolamide (AZOPT) 1 % ophthalmic suspension Place 1 drop into both eyes in the morning and at bedtime.    [provider]  cefdinir (OMNICEF) 300 MG capsule Take 1 capsule (300 mg total) by mouth every 12 (twelve) hours. 07/25/20   Ghimire, Henreitta Leber, MD  docusate sodium (COLACE) 100 MG capsule Take 100 mg by mouth daily.    [provider]  levothyroxine (SYNTHROID) 50 MCG tablet Take 50 mcg by mouth daily before breakfast.    [provider]  magnesium oxide (MAG-OX) 400 (241.3 Mg) MG tablet Take 1 tablet (400 mg total) by mouth 2 (two) times daily. 11/30/19   Regalado, Belkys A, MD  metoprolol succinate (TOPROL-XL) 25 MG 24 hr tablet Take 12.5 mg by mouth daily.    [provider]  timolol (TIMOPTIC) 0.25 % ophthalmic solution Place 1 drop into both eyes 2 (two) times daily. 12/22/19   [provider]  vitamin B-12 (CYANOCOBALAMIN) 1000 MCG tablet Take 1,000 mcg by mouth daily.    [provider]  vitamin B-12 100 MCG tablet Take 1 tablet (100 mcg total) by mouth daily. 11/30/19   Regalado, Cassie Freer, MD    Physical Exam: BP (!) 149/69   Pulse 100   Temp 97.7 F (36.5 C) (Oral)   Resp 18   SpO2 96%   General: 87 y.o. year-old female well developed well nourished in no acute distress.  Alert and confused in a state of dementia. Cardiovascular: Regular rate and rhythm with no rubs or gallops.  No thyromegaly or JVD noted.  1+ pitting edema in lower extremity bilaterally. Respiratory: Clear to auscultation with no wheezes or rales. Good inspiratory effort. Abdomen: Soft nontender nondistended with normal bowel sounds x4 quadrants. Muskuloskeletal: No cyanosis or clubbing. 1+ pitting edema in lower extremities bilaterally. Neuro: CN II-XII intact, strength, sensation, reflexes Skin: Erythema noted R knee with hardware protruding out of lateral R  knee.    Psychiatry: Judgement and insight appear altered. Mood is appropriate for condition and setting          Labs on Admission:  Basic Metabolic Panel: Recent Labs  Lab 07/22/20 1008 07/22/20 1501 07/22/20 1532 07/23/20 0608 07/24/20 0409 07/25/20 0253 07/26/20 1223 07/27/20 1914  NA 135  --   --  133* 131*  --  133* 134*  K 4.3  --   --  4.7 3.6  --  4.1 4.1  CL 103  --   --  101 99  --  100 99  CO2 24  --   --  22 23  --  26 27  GLUCOSE 144*  --   --  127* 107*  --  106* 106*  BUN 13  --   --  19 19  --  11 9  CREATININE 0.72  --  0.59 0.81 0.60  --  0.54 0.57  CALCIUM 8.7*  --   --  8.7* 8.3*  --  8.3* 8.6*  MG  --  2.0  --  1.9 1.9 1.8  --   --    Liver Function Tests: Recent Labs  Lab 07/22/20 1008 07/23/20 0608 07/26/20 1223  AST 14* 24 18  ALT 11 14 19   ALKPHOS 91 99 77  BILITOT 0.7 0.9 0.8  PROT 6.5 6.6 5.7*  ALBUMIN 2.8* 2.8* 2.5*   No results for input(s): LIPASE, AMYLASE in the last 168 hours. No results for input(s): AMMONIA in the last 168 hours. CBC: Recent Labs  Lab 07/23/20 0409 07/24/20 0409 07/25/20 0253 07/26/20 1223 07/27/20 1914  WBC 14.0* 9.9 7.6 6.9 8.3  NEUTROABS 13.0* 7.4 4.6 4.4 5.6  HGB 11.6* 9.8* 10.0* 10.0* 10.7*  HCT 35.6* 30.7* 31.0* 31.2* 33.8*  MCV 89.2 89.5 89.1 94.3 93.1  PLT 243 222 220 248 263   Cardiac Enzymes: No results for input(s): CKTOTAL, CKMB, CKMBINDEX, TROPONINI in the last 168 hours.  BNP (last 3 results) Recent Labs    01/12/20 0955 07/22/20 1502 07/23/20 0409  BNP 114.3* 129.2* 422.0*    ProBNP (last 3 results) No results for input(s): PROBNP in the last 8760 hours.  CBG: Recent Labs  Lab 07/24/20 1224  GLUCAP 97    Radiological Exams on Admission: DG Knee Complete 4 Views Right  Result Date: 07/27/2020 CLINICAL DATA:  Pain of right knee few days ago. No trauma. Head surgery of right femur 12/20/2017 EXAM: RIGHT KNEE - COMPLETE 4+ VIEW COMPARISON:  X-ray right knee 06/23/2020,  x-ray right knee 12/20/2017 FINDINGS: Interval worsening of surgical hardware failure with at least 2 cm lateral migration of plate and screw fixation along the distal femur. Redemonstration of at least 1 fractured screw. Re-fracture through a distal femoral shaft fracture and bone graft. Tricompartmental severe degenerative changes of the right knee. IMPRESSION: 1. Interval worsening of surgical hardware failure with at least 2 cm lateral migration of plate and screw fixation along the distal femur. Redemonstration of at least 1 fractured screw. 2. Re-fracture through a distal femoral shaft fracture and bone graft. Electronically Signed   By: Iven Finn M.D.   On: 07/27/2020 18:39    EKG: I independently viewed the EKG done and my findings are as followed: Sinus rhythm rate of 92.  Nonspecific ST-T changes.  QTc 493.  Assessment/Plan Present on Admission:  Cellulitis  Active Problems:   Cellulitis  Right knee cellulitis complicated by hardware failure, POA. Blood cultures obtained, follow-up results. Started on Rocephin in the ED, continue. Currently afebrile with no leukocytosis Monitor fever curve and WBC  Interval worsening of surgical hardware failure with at least 2 cm lateral migration of plate and screw fixation along the distal femur.  Patient's daughter has requested surgery.  Case was discussed by EDP and orthopedic surgery, possible surgical intervention on Friday.  Mild euvolemic hyponatremia Na+ 134 LR 30cc/hr x 2 days Repeat chemistry panel in the AM  Hypothyroidism Restart home levothyroxine.  Essential hypertension BP stable Resume home oral antihypertensive. Monitor vital signs  Recent E. coli UTI She is currently on Rocephin for cellulitis, would cover e-coli UTI, continue. She had 2 days left of cefdinir  Vitamin B12 deficiency Resume vitamin B12 supplement.  Ambulatory dysfunction She is wheelchair-bound   DVT prophylaxis: Subcu Lovenox  daily  Code Status: Full code  Family Communication: None at bedside.   Disposition Plan: Admit to telemetry surgical unit.  Consults called: Orthopedic surgery consulted by EDP.  Admission status: Inpatient status.   Status is: Inpatient    Dispo:  Patient From: Home  Planned Disposition: Home with [facility] once orthopedic surgery signs off.  Medically stable for discharge: No         Kayleen Memos MD [facility] Pager [phone]  If 7PM-7AM, please contact night-coverage [email or web address] Password [facility]  07/27/2020, 9:23 PM

## 2020-07-27 NOTE — ED Provider Notes (Signed)
[facility] EMERGENCY DEPARTMENT Provider Note   CSN: [id number] Arrival date & time:        History Chief Complaint  Patient presents with   Post-op Problem    Stephanie Donovan is a 87 y.o. female.  Pt presents to the ED today with an abnormality to her right knee.  Pt has a hx of a right distal femur fracture nonunion.  She does not ambulate and gets around in her wheelchair.  She has been seeing Dr. Ninfa Linden for the distal femur plate tenting her skin.  There was no soft tissue compromise when she saw him on 6/21.  She was admitted from 7/1 to 7/4 for syncope.  She came back to the ED yesterday and was treated with rocephin for a UTI.  She is a poor surgical candidate per notes.  She denies any pain.      Past Medical History:  Diagnosis Date   Blood transfusion without reported diagnosis    Chronic back pain    Chronic constipation    Dysrhythmia    Afib with RVR - 2015, Oaroxyomal Afib   GI bleed    Glaucoma    Syncope 2015   Thyroid disease    TIA (transient ischemic attack) 2015    Patient Active Problem List   Diagnosis Date Noted   Cellulitis 07/27/2020   Supracondylar fracture of right femur, closed, initial encounter ([place]) 01/01/2018   Closed fracture of right distal femur ([place]) 12/24/2017   Acute blood loss anemia 12/19/2017   Malnutrition of moderate degree 12/18/2017   Femur fracture ([place]) 12/17/2017   Fall 12/17/2017   HLD (hyperlipidemia) 12/17/2017   Chronic anemia 12/17/2017   Closed 4-part fracture of proximal humerus, right, initial encounter 10/09/2017   Paroxysmal A-fib ([place]) 09/21/2017   Closed displaced fracture of right femoral neck ([place]) 09/21/2017   Closed fracture of right proximal humerus 09/21/2017   Dehydration 09/21/2017   Hyponatremia 09/21/2017   Elevated CK 09/21/2017   Thyroid nodule 09/21/2017   Closed right hip fracture ([place]) 09/21/2017   Chronic diastolic CHF (congestive heart failure) ([place]) 09/21/2017    Sinus tachycardia 09/21/2017   Acute lower UTI 09/21/2017   TIA (transient ischemic attack) 08/25/2014   Aphasia 08/25/2014   Chronic constipation 05/19/2014   Fracture of multiple pubic rami ([place]) 04/11/2014   Pubic bone fracture ([place]) 04/11/2014   Left hip pain 04/11/2014   Atrial fibrillation with RVR ([place]) 03/28/2013   Diarrhea 03/28/2013   Hypothyroidism 03/24/2013   Syncope 03/24/2013   Glaucoma 03/24/2013   Hypokalemia 03/24/2013   Acute gastroenteritis 03/24/2013    Past Surgical History:  Procedure Laterality Date   CATARACT EXTRACTION, BILATERAL     COLONOSCOPY     HIP ARTHROPLASTY Right 09/22/2017   Procedure: ARTHROPLASTY BIPOLAR HIP (HEMIARTHROPLASTY);  Surgeon: [name], MD;  Location: [facility];  Service: Orthopedics;  Laterality: Right;   JOINT REPLACEMENT  11/20/2011   LTKR   ORIF FEMUR FRACTURE Right 12/20/2017   Procedure: OPEN REDUCTION INTERNAL FIXATION (ORIF) DISTAL FEMUR FRACTURE;  Surgeon: [name], MD;  Location: [facility];  Service: Orthopedics;  Laterality: Right;   REVERSE SHOULDER ARTHROPLASTY Right 10/09/2017   REVERSE SHOULDER ARTHROPLASTY Right 10/09/2017   Procedure: REVERSE SHOULDER ARTHROPLASTY;  Surgeon: [name], MD;  Location: [facility];  Service: Orthopedics;  Laterality: Right;     OB History   No obstetric history on file.     Family History  Problem Relation Age  of Onset   Osteoporosis Mother    Cancer - Colon Father    Schizophrenia Son     Social History   Tobacco Use   Smoking status: Never   Smokeless tobacco: Never  Vaping Use   Vaping Use: Never used  Substance Use Topics   Alcohol use: No   Drug use: No    Home Medications Prior to Admission medications   Medication Sig Start Date End Date Taking? Authorizing Provider  acetaminophen (TYLENOL) 500 MG tablet Take 500 mg by mouth in the morning and at bedtime.    [provider]  aspirin EC 81 MG tablet Take 81 mg by mouth daily.     [provider]  brimonidine (ALPHAGAN P) 0.1 % SOLN Place 1 drop into the left eye in the morning and at bedtime.    [provider]  brinzolamide (AZOPT) 1 % ophthalmic suspension Place 1 drop into both eyes in the morning and at bedtime.    [provider]  cefdinir (OMNICEF) 300 MG capsule Take 1 capsule (300 mg total) by mouth every 12 (twelve) hours. 07/25/20   Ghimire, Henreitta Leber, MD  docusate sodium (COLACE) 100 MG capsule Take 100 mg by mouth daily.    [provider]  levothyroxine (SYNTHROID) 50 MCG tablet Take 50 mcg by mouth daily before breakfast.    [provider]  magnesium oxide (MAG-OX) 400 (241.3 Mg) MG tablet Take 1 tablet (400 mg total) by mouth 2 (two) times daily. 11/30/19   Regalado, Belkys A, MD  metoprolol succinate (TOPROL-XL) 25 MG 24 hr tablet Take 12.5 mg by mouth daily.    [provider]  timolol (TIMOPTIC) 0.25 % ophthalmic solution Place 1 drop into both eyes 2 (two) times daily. 12/22/19   [provider]  vitamin B-12 (CYANOCOBALAMIN) 1000 MCG tablet Take 1,000 mcg by mouth daily.    [provider]  vitamin B-12 100 MCG tablet Take 1 tablet (100 mcg total) by mouth daily. 11/30/19   Regalado, Belkys A, MD    Allergies    Aspirin, Epinephrine, Penicillins, and Codeine  Review of Systems   Review of Systems  Musculoskeletal:        Right knee deformity  All other systems reviewed and are negative.  Physical Exam Updated Vital Signs BP (!) 149/69   Pulse 100   Temp 97.7 F (36.5 C) (Oral)   Resp 18   SpO2 96%   Physical Exam Vitals and nursing note reviewed.  Constitutional:      Appearance: Normal appearance.  HENT:     Head: Normocephalic and atraumatic.     Right Ear: External ear normal.     Left Ear: External ear normal.     Nose: Nose normal.     Mouth/Throat:     Mouth: Mucous membranes are moist.     Pharynx: Oropharynx is clear.  Eyes:     Extraocular Movements:  Extraocular movements intact.     Conjunctiva/sclera: Conjunctivae normal.     Pupils: Pupils are equal, round, and reactive to light.  Cardiovascular:     Rate and Rhythm: Normal rate and regular rhythm.     Pulses: Normal pulses.     Heart sounds: Normal heart sounds.  Pulmonary:     Effort: Pulmonary effort is normal.     Breath sounds: Normal breath sounds.  Abdominal:     General: Abdomen is flat. Bowel sounds are normal.     Palpations: Abdomen is  soft.  Musculoskeletal:     Cervical back: Normal range of motion and neck supple.     Comments: Deformity right knee.  See picture. Distal femur plate nearly coming through skin.  I can see the metal under the small bit of skin that is still there.  Some yellow drainage noted.  Skin:    Capillary Refill: Capillary refill takes less than 2 seconds.  Neurological:     Mental Status: She is alert. Mental status is at baseline.      ED Results / Procedures / Treatments   Labs (all labs ordered are listed, but only abnormal results are displayed) Labs Reviewed  BASIC METABOLIC PANEL - Abnormal; Notable for the following components:      Result Value   Sodium 134 (*)    Glucose, Bld 106 (*)    Calcium 8.6 (*)    All other components within normal limits  CBC WITH DIFFERENTIAL/PLATELET - Abnormal; Notable for the following components:   RBC 3.63 (*)    Hemoglobin 10.7 (*)    HCT 33.8 (*)    All other components within normal limits  CULTURE, BLOOD (ROUTINE X 2)  CULTURE, BLOOD (ROUTINE X 2)  RESP PANEL BY RT-PCR (FLU A&B, COVID) ARPGX2  LACTIC ACID, PLASMA  LACTIC ACID, PLASMA  CREATININE, SERUM  COMPREHENSIVE METABOLIC PANEL  CBC  PROTIME-INR  APTT  MAGNESIUM  PHOSPHORUS    EKG None  Radiology DG Knee Complete 4 Views Right  Result Date: 07/27/2020 CLINICAL DATA:  Pain of right knee few days ago. No trauma. Head surgery of right femur 12/20/2017 EXAM: RIGHT KNEE - COMPLETE 4+ VIEW COMPARISON:  X-ray right knee  06/23/2020, x-ray right knee 12/20/2017 FINDINGS: Interval worsening of surgical hardware failure with at least 2 cm lateral migration of plate and screw fixation along the distal femur. Redemonstration of at least 1 fractured screw. Re-fracture through a distal femoral shaft fracture and bone graft. Tricompartmental severe degenerative changes of the right knee. IMPRESSION: 1. Interval worsening of surgical hardware failure with at least 2 cm lateral migration of plate and screw fixation along the distal femur. Redemonstration of at least 1 fractured screw. 2. Re-fracture through a distal femoral shaft fracture and bone graft. Electronically Signed   By: Iven Finn M.D.   On: 07/27/2020 18:39    Procedures Procedures   Medications Ordered in ED Medications  cefTRIAXone (ROCEPHIN) 1 g in sodium chloride 0.9 % 100 mL IVPB (1 g Intravenous New Bag/Given 07/27/20 2156)  enoxaparin (LOVENOX) injection 40 mg (has no administration in time range)  multivitamin with minerals tablet 1 tablet (has no administration in time range)  levothyroxine (SYNTHROID) tablet 50 mcg (has no administration in time range)  timolol (TIMOPTIC) 0.25 % ophthalmic solution 1 drop (has no administration in time range)  vitamin B-12 (CYANOCOBALAMIN) tablet 1,000 mcg (has no administration in time range)  metoprolol succinate (TOPROL-XL) 24 hr tablet 12.5 mg (has no administration in time range)    ED Course  I have reviewed the triage vital signs and the nursing notes.  Pertinent labs & imaging results that were available during my care of the patient were reviewed by me and considered in my medical decision making (see chart for details).    MDM Rules/Calculators/A&P                          Pt and daughter have not wanted a surgery, but now the daughter ([place])  wants it done.  She said the facility will not take her back with it sticking out of her skin and pt can't do anything with the knee the way it  is.  I spoke with Dr. Marlou Sa (on call for Dr. Ninfa Linden).  He said that if the daughter wants surgery, it may be an AKA.  She is aware and still wants it done.  He recommends admitting to medicine due to the multiple medical problems pt has going on.  Pt d/w Dr. [place] Crane ([facility]) who will admit.  Final Clinical Impression(s) / ED Diagnoses Final diagnoses:  Cellulitis of right lower extremity  Hardware complicating wound infection, initial encounter [facility])    Rx / [place] Orders ED Discharge Orders     None        Isla Pence, MD 07/27/20 2212

## 2020-07-28 ENCOUNTER — Telehealth: Payer: Self-pay | Admitting: Orthopedic Surgery

## 2020-07-28 ENCOUNTER — Other Ambulatory Visit: Payer: Self-pay | Admitting: Physician Assistant

## 2020-07-28 DIAGNOSIS — L03115 Cellulitis of right lower limb: Secondary | ICD-10-CM

## 2020-07-28 DIAGNOSIS — T847XXA Infection and inflammatory reaction due to other internal orthopedic prosthetic devices, implants and grafts, initial encounter: Secondary | ICD-10-CM | POA: Diagnosis not present

## 2020-07-28 LAB — COMPREHENSIVE METABOLIC PANEL
ALT: 15 U/L (ref 0–44)
AST: 11 U/L — ABNORMAL LOW (ref 15–41)
Albumin: 2.6 g/dL — ABNORMAL LOW (ref 3.5–5.0)
Alkaline Phosphatase: 80 U/L (ref 38–126)
Anion gap: 5 (ref 5–15)
BUN: 8 mg/dL (ref 8–23)
CO2: 28 mmol/L (ref 22–32)
Calcium: 8.6 mg/dL — ABNORMAL LOW (ref 8.9–10.3)
Chloride: 101 mmol/L (ref 98–111)
Creatinine, Ser: 0.57 mg/dL (ref 0.44–1.00)
GFR, Estimated: >60 mL/min (ref >60)
Glucose, Bld: 97 mg/dL (ref 70–99)
Potassium: 4 mmol/L (ref 3.5–5.1)
Sodium: 134 mmol/L — ABNORMAL LOW (ref 135–145)
Total Bilirubin: 0.6 mg/dL (ref 0.3–1.2)
Total Protein: 6 g/dL — ABNORMAL LOW (ref 6.5–8.1)

## 2020-07-28 LAB — CBC
HCT: 33.1 % — ABNORMAL LOW (ref 36.0–46.0)
Hemoglobin: 10.6 g/dL — ABNORMAL LOW (ref 12.0–15.0)
MCH: 29 pg (ref 26.0–34.0)
MCHC: 32 g/dL (ref 30.0–36.0)
MCV: 90.7 fL (ref 80.0–100.0)
Platelets: 259 10*3/uL (ref 150–400)
RBC: 3.65 MIL/uL — ABNORMAL LOW (ref 3.87–5.11)
RDW: 14.6 % (ref 11.5–15.5)
WBC: 7.3 10*3/uL (ref 4.0–10.5)
nRBC: 0 % (ref 0.0–0.2)

## 2020-07-28 LAB — PROTIME-INR
INR: 1.1 (ref 0.8–1.2)
Prothrombin Time: 14.5 seconds (ref 11.4–15.2)

## 2020-07-28 LAB — APTT: aPTT: 33 seconds (ref 24–36)

## 2020-07-28 LAB — PHOSPHORUS: Phosphorus: 3.3 mg/dL (ref 2.5–4.6)

## 2020-07-28 LAB — MAGNESIUM: Magnesium: 1.9 mg/dL (ref 1.7–2.4)

## 2020-07-28 MED ORDER — BRINZOLAMIDE 1 % OP SUSP
1.0000 [drp] | Freq: Two times a day (BID) | OPHTHALMIC | Status: DC
  Administered 2020-07-28 – 2020-08-04 (×15): 1 [drp] via OPHTHALMIC
  Filled 2020-07-28: qty 10

## 2020-07-28 MED ORDER — OXYCODONE HCL 5 MG PO TABS: 5.0000 mg | ORAL_TABLET | Freq: Four times a day (QID) | ORAL | Status: DC | PRN

## 2020-07-28 MED ORDER — BRIMONIDINE TARTRATE 0.2 % OP SOLN
1.0000 [drp] | Freq: Two times a day (BID) | OPHTHALMIC | Status: DC
  Administered 2020-07-28 – 2020-08-04 (×15): 1 [drp] via OPHTHALMIC
  Filled 2020-07-28 (×5): qty 5

## 2020-07-28 MED ORDER — MORPHINE SULFATE (PF) 2 MG/ML IV SOLN
2.0000 mg | INTRAVENOUS | Status: DC | PRN
  Administered 2020-07-28 – 2020-07-31 (×3): 2 mg via INTRAVENOUS
  Filled 2020-07-28 (×3): qty 1

## 2020-07-28 NOTE — Progress Notes (Signed)
PROGRESS NOTE  Stephanie Donovan UJW:[id number] DOB: Oct 11, 1927 DOA: 07/27/2020 PCP: System, Provider Not In   LOS: 1 day   Brief Narrative / Interim history: 87 year old female with history of dementia, hypothyroidism, HTN, who was recently hospitalized and discharged on 7/4 after being admitted for syncope and E. coli UTI comes in with an abnormality of the right distal femur associated with hardware failure.  She has a history of knee replacement, at baseline she is nonambulatory and uses a wheelchair.  It was noted that there was some metal protruding from her knee at the [facility]F as well as purulent discharge.  X-ray in the ED revealed worsening of the surgical hardware failure with at least 2 cm laceration migration of plate and screw fixation along the distal femur.  Orthopedic was consulted and she was admitted to the hospital  Subjective / 24h Interval events: She is doing well this morning, denies any chest pain, denies any shortness of breath.  No abdominal pain, nausea or vomiting.  Assessment & Plan: Principal Problem Right knee cellulitis, concern for hardware infection, POA, history of right distal femur fracture/dislocation-orthopedic surgery consulted.  Apparently she was deemed not an operative candidate in the past but now it seems like there is a metal protruding and family wants that reconsidered.  Active Problems Recent E. coli UTI-currently on Rocephin for cellulitis.  At the time of admission she had 2 days later had cefdinir  Mild euvolemic hyponatremia-sodium stable  Hypothyroidism -Restart home levothyroxine.   Essential hypertension-continue home metoprolol, blood pressure stable   Vitamin B12 deficiency -Resume MVI   Ambulatory dysfunction -She is wheelchair-bound  Scheduled Meds:  enoxaparin (LOVENOX) injection  40 mg Subcutaneous Q24H   levothyroxine  50 mcg Oral QAC breakfast   metoprolol succinate  12.5 mg Oral Daily   multivitamin with minerals  1 tablet  Oral Daily   timolol  1 drop Both Eyes BID   Continuous Infusions:  cefTRIAXone (ROCEPHIN)  IV     lactated ringers 30 mL/hr at 07/28/20 0559   PRN Meds:.acetaminophen, melatonin, morphine injection, ondansetron (ZOFRAN) IV, oxyCODONE, polyethylene glycol  Diet Orders (From admission, onward)     Start     Ordered   07/29/20 0001  Diet NPO time specified  Diet effective midnight        07/27/20 2134   07/27/20 2135  Diet Heart Room service appropriate? Yes; Fluid consistency: Thin  Diet effective now       Question Answer Comment  Room service appropriate? Yes   Fluid consistency: Thin      07/27/20 2134            DVT prophylaxis: enoxaparin (LOVENOX) injection 40 mg Start: 07/28/20 0800 SCDs Start: 07/27/20 2116     Code Status: Full Code  Family Communication: no family at bedside   Status is: Inpatient  Remains inpatient appropriate because:Inpatient level of care appropriate due to severity of illness  Dispo:  Patient From: Home  Planned Disposition: Home with Health Care Svc  Medically stable for discharge: No    Level of care: Telemetry Surgical  Consultants:  Orthopedic surgery  Procedures:  none  Microbiology  Blood culture 7/6-no growth to date  Antimicrobials: Ceftriaxone 7/6   Objective: Vitals:   07/28/20 0045 07/28/20 0150 07/28/20 0553 07/28/20 0752  BP: (!) 126/58 (!) 146/84 (!) 153/90 136/89  Pulse: 94 88 88 85  Resp: 20 18 18 17   Temp:  98 F (36.7 C) (!) 97.3 F (36.3 C) 98  F (36.7 C)  TempSrc:  Axillary Oral Axillary  SpO2: 99% 97% 98% 97%  Weight:  62.9 kg    Height:  5\' 4"  (1.626 m)      Intake/Output Summary (Last 24 hours) at 07/28/2020 1040 Last data filed at 07/28/2020 0559 Gross per 24 hour  Intake 266.52 ml  Output 100 ml  Net 166.52 ml   Filed Weights   07/28/20 0150  Weight: 62.9 kg    Examination:  Constitutional: NAD Eyes: no scleral icterus ENMT: Mucous membranes are moist.  Neck: normal,  supple Respiratory: clear to auscultation bilaterally, no wheezing, no crackles.  Cardiovascular: Regular rate and rhythm, no murmurs / rubs / gallops. No LE edema.  Abdomen: non distended, no tenderness. Bowel sounds positive.  Musculoskeletal: no clubbing / cyanosis.  Skin: no rashes Neurologic: CN 2-12 grossly intact. Strength 5/5 in all 4.   Data Reviewed: I have independently reviewed following labs and imaging studies   CBC: Recent Labs  Lab 07/23/20 0409 07/24/20 0409 07/25/20 0253 07/26/20 1223 07/27/20 1914 07/28/20 0658  WBC 14.0* 9.9 7.6 6.9 8.3 7.3  NEUTROABS 13.0* 7.4 4.6 4.4 5.6  --   HGB 11.6* 9.8* 10.0* 10.0* 10.7* 10.6*  HCT 35.6* 30.7* 31.0* 31.2* 33.8* 33.1*  MCV 89.2 89.5 89.1 94.3 93.1 90.7  PLT 243 222 220 248 263 812   Basic Metabolic Panel: Recent Labs  Lab 07/22/20 1501 07/22/20 1532 07/23/20 0608 07/24/20 0409 07/25/20 0253 07/26/20 1223 07/27/20 1914 07/27/20 2149 07/28/20 0658  NA  --   --  133* 131*  --  133* 134*  --  134*  K  --   --  4.7 3.6  --  4.1 4.1  --  4.0  CL  --   --  101 99  --  100 99  --  101  CO2  --   --  22 23  --  26 27  --  28  GLUCOSE  --   --  127* 107*  --  106* 106*  --  97  BUN  --   --  19 19  --  11 9  --  8  CREATININE  --    < > 0.81 0.60  --  0.54 0.57 0.52 0.57  CALCIUM  --   --  8.7* 8.3*  --  8.3* 8.6*  --  8.6*  MG 2.0  --  1.9 1.9 1.8  --   --   --  1.9  PHOS  --   --   --   --   --   --   --   --  3.3   < > = values in this interval not displayed.   Liver Function Tests: Recent Labs  Lab 07/22/20 1008 07/23/20 0608 07/26/20 1223 07/28/20 0658  AST 14* 24 18 11*  ALT 11 14 19 15   ALKPHOS 91 99 77 80  BILITOT 0.7 0.9 0.8 0.6  PROT 6.5 6.6 5.7* 6.0*  ALBUMIN 2.8* 2.8* 2.5* 2.6*   Coagulation Profile: Recent Labs  Lab 07/28/20 0658  INR 1.1   HbA1C: No results for input(s): HGBA1C in the last 72 hours. CBG: Recent Labs  Lab 07/24/20 1224  GLUCAP 97    Recent Results (from the  past 240 hour(s))  Urine Culture     Status: Abnormal   Collection Time: 07/22/20  2:42 PM   Specimen: Urine, Clean Catch  Result Value Ref Range Status   Specimen Description  URINE, CLEAN CATCH  Final   Special Requests   Final    NONE Performed at [facility], [place] [address], [place], [place] [address]    Culture 80,000 COLONIES/mL ESCHERICHIA COLI (A)  Final   Report Status 07/24/2020 FINAL  Final   Organism ID, Bacteria ESCHERICHIA COLI (A)  Final      Susceptibility   Escherichia coli - MIC*    AMPICILLIN <=2 SENSITIVE Sensitive     CEFAZOLIN <=4 SENSITIVE Sensitive     CEFEPIME <=0.12 SENSITIVE Sensitive     CEFTRIAXONE <=0.25 SENSITIVE Sensitive     CIPROFLOXACIN <=0.25 SENSITIVE Sensitive     GENTAMICIN <=1 SENSITIVE Sensitive     IMIPENEM <=0.25 SENSITIVE Sensitive     NITROFURANTOIN <=16 SENSITIVE Sensitive     TRIMETH/SULFA <=20 SENSITIVE Sensitive     AMPICILLIN/SULBACTAM <=2 SENSITIVE Sensitive     PIP/TAZO <=4 SENSITIVE Sensitive     * 80,000 COLONIES/mL ESCHERICHIA COLI  SARS CORONAVIRUS 2 (TAT 6-24 HRS) Nasopharyngeal Nasopharyngeal Swab     Status: None   Collection Time: 07/22/20  4:03 PM   Specimen: Nasopharyngeal Swab  Result Value Ref Range Status   SARS Coronavirus 2 NEGATIVE NEGATIVE Final    Comment: (NOTE) SARS-CoV-2 target nucleic acids are NOT DETECTED.  The SARS-CoV-2 RNA is generally detectable in upper and lower respiratory specimens during the acute phase of infection. Negative results do not preclude SARS-CoV-2 infection, do not rule out co-infections with other pathogens, and should not be used as the sole basis for treatment or other patient management decisions. Negative results must be combined with clinical observations, patient history, and epidemiological information. The expected result is Negative.  Fact Sheet for Patients: SugarRoll.be  Fact Sheet for Healthcare  Providers: https://www.woods-mathews.com/  This test is not yet approved or cleared by the [place] FDA and  has been authorized for detection and/or diagnosis of SARS-CoV-2 by FDA under an Emergency Use Authorization (EUA). This EUA will remain  in effect (meaning this test can be used) for the duration of the COVID-19 declaration under Se ction 564(b)(1) of the Act, 21 U.S.C. section 360bbb-3(b)(1), unless the authorization is terminated or revoked sooner.  Performed at [facility], [place] [address] [place], [place] [address]   Resp Panel by RT-PCR (Flu A&B, Covid) Nasopharyngeal Swab     Status: None   Collection Time: 07/25/20 11:22 AM   Specimen: Nasopharyngeal Swab; Nasopharyngeal(NP) swabs in vial transport medium  Result Value Ref Range Status   SARS Coronavirus 2 by RT PCR NEGATIVE NEGATIVE Final    Comment: (NOTE) SARS-CoV-2 target nucleic acids are NOT DETECTED.  The SARS-CoV-2 RNA is generally detectable in upper respiratory specimens during the acute phase of infection. The lowest concentration of SARS-CoV-2 viral copies this assay can detect is 138 copies/mL. A negative result does not preclude SARS-Cov-2 infection and should not be used as the sole basis for treatment or other patient management decisions. A negative result may occur with  improper specimen collection/handling, submission of specimen other than nasopharyngeal swab, presence of viral mutation(s) within the areas targeted by this assay, and inadequate number of viral copies(<138 copies/mL). A negative result must be combined with clinical observations, patient history, and epidemiological information. The expected result is Negative.  Fact Sheet for Patients:  EntrepreneurPulse.com.au  Fact Sheet for Healthcare Providers:  IncredibleEmployment.be  This test is no t yet approved or cleared by the [place] FDA and  has been authorized  for detection  and/or diagnosis of SARS-CoV-2 by FDA under an Emergency Use Authorization (EUA). This EUA will remain  in effect (meaning this test can be used) for the duration of the COVID-19 declaration under Section 564(b)(1) of the Act, 21 U.S.C.section 360bbb-3(b)(1), unless the authorization is terminated  or revoked sooner.       Influenza A by PCR NEGATIVE NEGATIVE Final   Influenza B by PCR NEGATIVE NEGATIVE Final    Comment: (NOTE) The Xpert Xpress SARS-CoV-2/FLU/RSV plus assay is intended as an aid in the diagnosis of influenza from Nasopharyngeal swab specimens and should not be used as a sole basis for treatment. Nasal washings and aspirates are unacceptable for Xpert Xpress SARS-CoV-2/FLU/RSV testing.  Fact Sheet for Patients: EntrepreneurPulse.com.au  Fact Sheet for Healthcare Providers: IncredibleEmployment.be  This test is not yet approved or cleared by the [place] FDA and has been authorized for detection and/or diagnosis of SARS-CoV-2 by FDA under an Emergency Use Authorization (EUA). This EUA will remain in effect (meaning this test can be used) for the duration of the COVID-19 declaration under Section 564(b)(1) of the Act, 21 U.S.C. section 360bbb-3(b)(1), unless the authorization is terminated or revoked.  Performed at [facility], [place] [address] [place], [place] [address]   Urine culture     Status: Abnormal   Collection Time: 07/26/20  1:44 PM   Specimen: Urine, Clean Catch  Result Value Ref Range Status   Specimen Description URINE, CLEAN CATCH  Final   Special Requests   Final    NONE Performed at [facility], [place] [address] [place], [place] [address]    Culture MULTIPLE SPECIES PRESENT, SUGGEST RECOLLECTION (A)  Final   Report Status 07/27/2020 FINAL  Final  Resp Panel by RT-PCR (Flu A&B, Covid) Nasopharyngeal Swab     Status: None   Collection Time: 07/27/20  7:36 PM   Specimen:  Nasopharyngeal Swab; Nasopharyngeal(NP) swabs in vial transport medium  Result Value Ref Range Status   SARS Coronavirus 2 by RT PCR NEGATIVE NEGATIVE Final    Comment: (NOTE) SARS-CoV-2 target nucleic acids are NOT DETECTED.  The SARS-CoV-2 RNA is generally detectable in upper respiratory specimens during the acute phase of infection. The lowest concentration of SARS-CoV-2 viral copies this assay can detect is 138 copies/mL. A negative result does not preclude SARS-Cov-2 infection and should not be used as the sole basis for treatment or other patient management decisions. A negative result may occur with  improper specimen collection/handling, submission of specimen other than nasopharyngeal swab, presence of viral mutation(s) within the areas targeted by this assay, and inadequate number of viral copies(<138 copies/mL). A negative result must be combined with clinical observations, patient history, and epidemiological information. The expected result is Negative.  Fact Sheet for Patients:  EntrepreneurPulse.com.au  Fact Sheet for Healthcare Providers:  IncredibleEmployment.be  This test is no t yet approved or cleared by the [place] FDA and  has been authorized for detection and/or diagnosis of SARS-CoV-2 by FDA under an Emergency Use Authorization (EUA). This EUA will remain  in effect (meaning this test can be used) for the duration of the COVID-19 declaration under Section 564(b)(1) of the Act, 21 U.S.C.section 360bbb-3(b)(1), unless the authorization is terminated  or revoked sooner.       Influenza A by PCR NEGATIVE NEGATIVE Final   Influenza B by PCR NEGATIVE NEGATIVE Final    Comment: (NOTE) The Xpert Xpress SARS-CoV-2/FLU/RSV plus assay is intended as an aid in the diagnosis of  influenza from Nasopharyngeal swab specimens and should not be used as a sole basis for treatment. Nasal washings and aspirates are unacceptable for  Xpert Xpress SARS-CoV-2/FLU/RSV testing.  Fact Sheet for Patients: EntrepreneurPulse.com.au  Fact Sheet for Healthcare Providers: IncredibleEmployment.be  This test is not yet approved or cleared by the [place] FDA and has been authorized for detection and/or diagnosis of SARS-CoV-2 by FDA under an Emergency Use Authorization (EUA). This EUA will remain in effect (meaning this test can be used) for the duration of the COVID-19 declaration under Section 564(b)(1) of the Act, 21 U.S.C. section 360bbb-3(b)(1), unless the authorization is terminated or revoked.  Performed at [facility], [place] [address], [place], [place] [address]   Culture, blood (routine x 2)     Status: None (Preliminary result)   Collection Time: 07/27/20  7:58 PM   Specimen: BLOOD  Result Value Ref Range Status   Specimen Description BLOOD RIGHT ANTECUBITAL  Final   Special Requests   Final    BOTTLES DRAWN AEROBIC AND ANAEROBIC Blood Culture results may not be optimal due to an inadequate volume of blood received in culture bottles   Culture   Final    NO GROWTH < 12 HOURS Performed at [facility], [place] [address] [place], [place] [address]    Report Status PENDING  Incomplete  Culture, blood (routine x 2)     Status: None (Preliminary result)   Collection Time: 07/27/20  8:42 PM   Specimen: BLOOD  Result Value Ref Range Status   Specimen Description BLOOD SITE NOT SPECIFIED  Final   Special Requests   Final    BOTTLES DRAWN AEROBIC AND ANAEROBIC Blood Culture results may not be optimal due to an inadequate volume of blood received in culture bottles   Culture   Final    NO GROWTH < 12 HOURS Performed at [facility], [place] [address], [place], [place] [address]    Report Status PENDING  Incomplete     Radiology Studies: DG Knee Complete 4 Views Right  Result Date: 07/27/2020 CLINICAL DATA:  Pain of right knee few days ago. No trauma. Head  surgery of right femur 12/20/2017 EXAM: RIGHT KNEE - COMPLETE 4+ VIEW COMPARISON:  X-ray right knee 06/23/2020, x-ray right knee 12/20/2017 FINDINGS: Interval worsening of surgical hardware failure with at least 2 cm lateral migration of plate and screw fixation along the distal femur. Redemonstration of at least 1 fractured screw. Re-fracture through a distal femoral shaft fracture and bone graft. Tricompartmental severe degenerative changes of the right knee. IMPRESSION: 1. Interval worsening of surgical hardware failure with at least 2 cm lateral migration of plate and screw fixation along the distal femur. Redemonstration of at least 1 fractured screw. 2. Re-fracture through a distal femoral shaft fracture and bone graft. Electronically Signed   By: Iven Finn M.D.   On: 07/27/2020 18:39    Marzetta Board, MD, PhD [facility]  Between 7 am - 7 pm I am available, please contact me via Amion (for emergencies) or Securechat (non urgent messages)  Between 7 pm - 7 am I am not available, please contact night coverage MD/APP via Amion

## 2020-07-28 NOTE — Telephone Encounter (Signed)
Patient's daughter [place] called asked if Dr. Sharol Given would call her as soon as possible concerning surgery to amputate her mothers right leg. [place] said she want to find out all of the details prior to surgery. [place] said she is POA for her mother. [place] asked if she could get a call back today? The number to contact [place] is [phone]

## 2020-07-28 NOTE — Telephone Encounter (Signed)
Pts daughter [place] called on her behalf stating the pt is leaning towards going through with the amput. Surgery but would like to speak with Dr. Sharol Given first. They would like a CB before Dr.Duda leaves the office if possible.   [phone]

## 2020-07-28 NOTE — Telephone Encounter (Signed)
Dr. Sharol Given called and sw pt's daughter.

## 2020-07-28 NOTE — Consult Note (Addendum)
ORTHOPAEDIC CONSULTATION  REQUESTING PHYSICIAN: Caren Griffins, MD  Chief Complaint: Right thigh pain with exposed hardware  HPI: Stephanie Donovan is a 87 y.o. female who presents with progressive collapse of internal fixation for a supracondylar right femur fracture.  Past Medical History:  Diagnosis Date   Blood transfusion without reported diagnosis    Chronic back pain    Chronic constipation    Dysrhythmia    Afib with RVR - 2015, Oaroxyomal Afib   GI bleed    Glaucoma    Syncope 2015   Thyroid disease    TIA (transient ischemic attack) 2015   Past Surgical History:  Procedure Laterality Date   CATARACT EXTRACTION, BILATERAL     COLONOSCOPY     HIP ARTHROPLASTY Right 09/22/2017   Procedure: ARTHROPLASTY BIPOLAR HIP (HEMIARTHROPLASTY);  Surgeon: [name], MD;  Location: [facility];  Service: Orthopedics;  Laterality: Right;   JOINT REPLACEMENT  11/20/2011   LTKR   ORIF FEMUR FRACTURE Right 12/20/2017   Procedure: OPEN REDUCTION INTERNAL FIXATION (ORIF) DISTAL FEMUR FRACTURE;  Surgeon: [name], MD;  Location: [facility];  Service: Orthopedics;  Laterality: Right;   REVERSE SHOULDER ARTHROPLASTY Right 10/09/2017   REVERSE SHOULDER ARTHROPLASTY Right 10/09/2017   Procedure: REVERSE SHOULDER ARTHROPLASTY;  Surgeon: [name], MD;  Location: [facility];  Service: Orthopedics;  Laterality: Right;   Social History   Socioeconomic History   Marital status: Widowed    Spouse name: Not on file   Number of children: Not on file   Years of education: Not on file   Highest education level: Not on file  Occupational History   Not on file  Tobacco Use   Smoking status: Never   Smokeless tobacco: Never  Vaping Use   Vaping Use: Never used  Substance and Sexual Activity   Alcohol use: No   Drug use: No   Sexual activity: Not Currently    Birth control/protection: Post-menopausal  Other Topics Concern   Not on file  Social History Narrative   Not on  file   Social Determinants of Health   Financial Resource Strain: Not on file  Food Insecurity: Not on file  Transportation Needs: Not on file  Physical Activity: Not on file  Stress: Not on file  Social Connections: Not on file   Family History  Problem Relation Age of Onset   Osteoporosis Mother    Cancer - Colon Father    Schizophrenia Son    - negative except otherwise stated in the family history section Allergies  Allergen Reactions   Aspirin Palpitations and Other (See Comments)    Unspecified Mixed reactions   Epinephrine Other (See Comments)    Rapped heart beat   Penicillins Other (See Comments)    Lost hearing temporarily DID THE REACTION INVOLVE: Swelling of the face/tongue/throat, SOB, or low BP? No Sudden or severe rash/hives, skin peeling, or the inside of the mouth or nose? No Did it require medical treatment? No When did it last happen?       If all above answers are "NO", may proceed with cephalosporin use.    Codeine Palpitations   Prior to Admission medications   Medication Sig Start Date End Date Taking? Authorizing Provider  acetaminophen (TYLENOL) 500 MG tablet Take 500 mg by mouth in the morning and at bedtime.   Yes [provider]  aspirin EC 81 MG tablet Take 81 mg by mouth daily.   Yes [provider]  brimonidine (ALPHAGAN P) 0.1 % SOLN Place 1 drop into the left eye in the morning and at bedtime.   Yes [provider]  brinzolamide (AZOPT) 1 % ophthalmic suspension Place 1 drop into both eyes in the morning and at bedtime.   Yes [provider]  docusate sodium (COLACE) 100 MG capsule Take 100 mg by mouth daily.   Yes [provider]  furosemide (LASIX) 20 MG tablet Take 20 mg by mouth daily.   Yes [provider]  levothyroxine (SYNTHROID) 50 MCG tablet Take 50 mcg by mouth daily before breakfast.   Yes [provider]  magnesium oxide (MAG-OX) 400 (241.3 Mg) MG tablet Take 1  tablet (400 mg total) by mouth 2 (two) times daily. 11/30/19  Yes Regalado, Belkys A, MD  metoprolol succinate (TOPROL-XL) 25 MG 24 hr tablet Take 12.5 mg by mouth daily.   Yes [provider]  timolol (TIMOPTIC) 0.25 % ophthalmic solution Place 1 drop into both eyes 2 (two) times daily. 12/22/19  Yes [provider]  vitamin B-12 (CYANOCOBALAMIN) 1000 MCG tablet Take 1,000 mcg by mouth daily.   Yes [provider]  cefdinir (OMNICEF) 300 MG capsule Take 1 capsule (300 mg total) by mouth every 12 (twelve) hours. Patient taking differently: Take 300 mg by mouth See admin instructions. Bid x 3 days 07/25/20   Jonetta Osgood, MD  vitamin B-12 100 MCG tablet Take 1 tablet (100 mcg total) by mouth daily. Patient not taking: Reported on 07/27/2020 11/30/19   Elmarie Shiley, MD   DG Knee Complete 4 Views Right  Result Date: 07/27/2020 CLINICAL DATA:  Pain of right knee few days ago. No trauma. Head surgery of right femur 12/20/2017 EXAM: RIGHT KNEE - COMPLETE 4+ VIEW COMPARISON:  X-ray right knee 06/23/2020, x-ray right knee 12/20/2017 FINDINGS: Interval worsening of surgical hardware failure with at least 2 cm lateral migration of plate and screw fixation along the distal femur. Redemonstration of at least 1 fractured screw. Re-fracture through a distal femoral shaft fracture and bone graft. Tricompartmental severe degenerative changes of the right knee. IMPRESSION: 1. Interval worsening of surgical hardware failure with at least 2 cm lateral migration of plate and screw fixation along the distal femur. Redemonstration of at least 1 fractured screw. 2. Re-fracture through a distal femoral shaft fracture and bone graft. Electronically Signed   By: Iven Finn M.D.   On: 07/27/2020 18:39   - pertinent xrays, CT, MRI studies were reviewed and independently interpreted  Positive ROS: All other systems have been reviewed and were otherwise negative with the exception of those  mentioned in the HPI and as above.  Physical Exam: General: Alert, no acute distress Psychiatric: Patient is competent for consent with normal mood and affect Lymphatic: No axillary or cervical lymphadenopathy Cardiovascular: No pedal edema Respiratory: No cyanosis, no use of accessory musculature GI: No organomegaly, abdomen is soft and non-tender    Images:  @ENCIMAGES @  Labs:  Lab Results  Component Value Date   HGBA1C 5.5 08/26/2014   ESRSEDRATE 34 (H) 12/23/2011   CRP 0.7 (H) 04/14/2014   REPTSTATUS PENDING 07/27/2020   CULT  07/27/2020    NO GROWTH < 12 HOURS Performed at [facility] [address] [place], [place] [address]    LABORGA ESCHERICHIA COLI (A) 07/22/2020    Lab Results  Component Value Date   ALBUMIN 2.6 (L) 07/28/2020   ALBUMIN 2.5 (L) 07/26/2020   ALBUMIN 2.8 (  L) 07/23/2020     CBC EXTENDED Latest Ref Rng & Units 07/28/2020 07/27/2020 07/26/2020  WBC 4.0 - 10.5 K/uL 7.3 8.3 6.9  RBC 3.87 - 5.11 MIL/uL 3.65(L) 3.63(L) 3.31(L)  HGB 12.0 - 15.0 g/dL 10.6(L) 10.7(L) 10.0(L)  HCT 36.0 - 46.0 % 33.1(L) 33.8(L) 31.2(L)  PLT 150 - 400 K/uL 259 263 248  NEUTROABS 1.7 - 7.7 K/uL - 5.6 4.4  LYMPHSABS 0.7 - 4.0 K/uL - 1.4 1.4    Neurologic: Patient does not have protective sensation bilateral lower extremities.   MUSCULOSKELETAL:   Skin: Examination there is no surrounding cellulitis no purulent drainage patient has exposure of the plate over the distal femoral condyles.  Review of the radiographs shows complete collapse of the supracondylar fracture with hardware failure.  Assessment: Assessment: Failure of internal fixation supracondylar femur fracture on the right with exposed hardware laterally.  Plan: Discussed with the patient and her family recommendation to proceed with above-the-knee amputation and removal of the internal fixation.  Discussed increased risk of systemic infection.  Patient states that at her age she does not want to  proceed with further surgical intervention.    I feel that patient would be safe to proceed with conservative therapy.  Recommend using a small gauze dressing to cover the small area of hardware prominence.  Recommend using her knee immobilizer.  Recommended doxycycline twice a day.  Feel the patient could be on doxycycline for prolonged period of time to minimize risk of systemic infection.  I have reviewed this with the patient's daughter and she would like to proceed with discharge back to [place] with the above recommendations.  Thank you for the consult and the opportunity to see Stephanie Donovan, [place] [phone] 12:39 PM

## 2020-07-28 NOTE — Progress Notes (Addendum)
Patient  arrived to 6N14, alert & oriented to self and place, pleasantly confused. No acute distress noted. No SOB/DOB/cough noted. 97% on room air. VS stable T98.0 BP 146/84 RR18 PR88. Cellulitis noted to R leg.dry dressing applied. Has c/o 10/10 R leg pain upon movement. medicated PRN. Patient was oriented to call bell and surroundings. fall precautions in place.  Call bell within reach and will continue to monitor.

## 2020-07-28 NOTE — Telephone Encounter (Signed)
Dr. Sharol Given sw pt's daughter again to advise that moving forward with amputation would not be in the best interest of the patient at the moment

## 2020-07-29 ENCOUNTER — Encounter (HOSPITAL_COMMUNITY)
Admission: EM | Disposition: A | Discharge status: Skilled Nursing Facility | Payer: Self-pay | Source: Home / Self Care | Attending: Internal Medicine

## 2020-07-29 DIAGNOSIS — T847XXA Infection and inflammatory reaction due to other internal orthopedic prosthetic devices, implants and grafts, initial encounter: Secondary | ICD-10-CM | POA: Diagnosis not present

## 2020-07-29 DIAGNOSIS — T847XXD Infection and inflammatory reaction due to other internal orthopedic prosthetic devices, implants and grafts, subsequent encounter: Secondary | ICD-10-CM | POA: Diagnosis not present

## 2020-07-29 DIAGNOSIS — Z7189 Other specified counseling: Secondary | ICD-10-CM

## 2020-07-29 DIAGNOSIS — L03115 Cellulitis of right lower limb: Secondary | ICD-10-CM | POA: Diagnosis not present

## 2020-07-29 LAB — BASIC METABOLIC PANEL
Anion gap: 6 (ref 5–15)
BUN: 6 mg/dL — ABNORMAL LOW (ref 8–23)
CO2: 27 mmol/L (ref 22–32)
Calcium: 8.3 mg/dL — ABNORMAL LOW (ref 8.9–10.3)
Chloride: 101 mmol/L (ref 98–111)
Creatinine, Ser: 0.66 mg/dL (ref 0.44–1.00)
GFR, Estimated: >60 mL/min (ref >60)
Glucose, Bld: 101 mg/dL — ABNORMAL HIGH (ref 70–99)
Potassium: 4.4 mmol/L (ref 3.5–5.1)
Sodium: 134 mmol/L — ABNORMAL LOW (ref 135–145)

## 2020-07-29 LAB — URINALYSIS, ROUTINE W REFLEX MICROSCOPIC
Bilirubin Urine: NEGATIVE
Glucose, UA: NEGATIVE mg/dL
Ketones, ur: NEGATIVE mg/dL
Leukocytes,Ua: NEGATIVE
Nitrite: NEGATIVE
Protein, ur: NEGATIVE mg/dL
Specific Gravity, Urine: 1.01 (ref 1.005–1.030)
pH: 8 (ref 5.0–8.0)

## 2020-07-29 LAB — CBC
HCT: 29.7 % — ABNORMAL LOW (ref 36.0–46.0)
Hemoglobin: 9.5 g/dL — ABNORMAL LOW (ref 12.0–15.0)
MCH: 29.1 pg (ref 26.0–34.0)
MCHC: 32 g/dL (ref 30.0–36.0)
MCV: 90.8 fL (ref 80.0–100.0)
Platelets: 265 10*3/uL (ref 150–400)
RBC: 3.27 MIL/uL — ABNORMAL LOW (ref 3.87–5.11)
RDW: 14.5 % (ref 11.5–15.5)
WBC: 7.3 10*3/uL (ref 4.0–10.5)
nRBC: 0 % (ref 0.0–0.2)

## 2020-07-29 SURGERY — AMPUTATION, ABOVE KNEE
Anesthesia: Choice | Site: Knee | Laterality: Right

## 2020-07-29 MED ORDER — FERROUS SULFATE 325 (65 FE) MG PO TABS
325.0000 mg | ORAL_TABLET | Freq: Two times a day (BID) | ORAL | Status: DC
  Administered 2020-07-29 – 2020-08-04 (×12): 325 mg via ORAL
  Filled 2020-07-29 (×13): qty 1

## 2020-07-29 NOTE — TOC Initial Note (Signed)
Transition of Care [facility]) - Initial/Assessment Note    Patient Details  Name: Stephanie Donovan MRN: [id number] Date of Birth: [birth year]  [facility]) CM/SW Contact:    Coralee Pesa, [place] Phone Number: 07/29/2020, 3:06 PM  Clinical Narrative:                 CSW was updated by MD that pt is from ALF, and was unsure of DC plan. ALF does not have anyone on sight to discuss this with until Monday. PT recommended SNF, CSW spoke with pt's daughter who stated she is agreeable to SNF placement with the goal of going back to [place]. [place] noted pt has had 2 vacines for covid, but is unsure about the boosters, that would need to be confirmed with [facility] on Monday. Pt has been to [facility] and [facility] before, They Do Not want [facility] again. Bed offers can be emailed to the daughter at [email or web address]. SW will continue to follow for DC needs.  Expected Discharge Plan: Skilled Nursing Facility Barriers to Discharge: SNF Pending bed offer, Continued Medical Work up   Patient Goals and CMS Choice Patient states their goals for this hospitalization and ongoing recovery are:: Pt is unable to participate in goal setting due to disorientation. CMS Medicare.gov Compare Post Acute Care list provided to:: Patient Represenative (must comment) (Daughter, [place]) Choice offered to / list presented to : Patient, Adult Children  Expected Discharge Plan and Services Expected Discharge Plan: [place] Choice: [place] Living arrangements for the past 2 months: [place]                                      Prior Living Arrangements/Services Living arrangements for the past 2 months: [place] Lives with:: Facility Resident Patient language and need for interpreter reviewed:: Yes Do you feel safe going back to the place where you live?: Yes      Need for Family Participation  in Patient Care: Yes (Comment) Care giver support system in place?: Yes (comment)   Criminal Activity/Legal Involvement Pertinent to Current Situation/Hospitalization: No - Comment as needed  Activities of Daily Living      Permission Sought/Granted Permission sought to share information with : Family Supports Permission granted to share information with : Yes, Verbal Permission Granted  Share Information with NAME: [place] granted to share info w AGENCY: [place] granted to share info w Relationship: Daughter  Permission granted to share info w Contact Information: [phone]  Emotional Assessment Appearance:: Appears stated age Attitude/Demeanor/Rapport: Unable to Assess Affect (typically observed): Unable to Assess Orientation: : Oriented to Self, Oriented to  Time, Oriented to Situation Alcohol / Substance Use: Not Applicable Psych Involvement: No (comment)  Admission diagnosis:  Cellulitis [L03.90] Cellulitis of right lower extremity [Z56.387] Hardware complicating wound infection, initial encounter ([place]) [F64.7XXA] Patient Active Problem List   Diagnosis Date Noted   Hardware complicating wound infection ([place])    Cellulitis 07/27/2020   Supracondylar fracture of right femur, closed, initial encounter ([place]) 01/01/2018   Closed fracture of right distal femur ([place]) 12/24/2017   Acute blood loss anemia 12/19/2017   Malnutrition of moderate degree 12/18/2017   Femur fracture ([place]) 12/17/2017   Fall 12/17/2017   HLD (hyperlipidemia) 12/17/2017   Chronic anemia 12/17/2017   Closed 4-part fracture  of proximal humerus, right, initial encounter 10/09/2017   Paroxysmal A-fib ([place]) 09/21/2017   Closed displaced fracture of right femoral neck ([place]) 09/21/2017   Closed fracture of right proximal humerus 09/21/2017   Dehydration 09/21/2017   Hyponatremia 09/21/2017   Elevated CK 09/21/2017   Thyroid nodule 09/21/2017   Closed right hip fracture  ([place]) 09/21/2017   Chronic diastolic CHF (congestive heart failure) ([place]) 09/21/2017   Sinus tachycardia 09/21/2017   Acute lower UTI 09/21/2017   TIA (transient ischemic attack) 08/25/2014   Aphasia 08/25/2014   Chronic constipation 05/19/2014   Fracture of multiple pubic rami ([place]) 04/11/2014   Pubic bone fracture ([place]) 04/11/2014   Left hip pain 04/11/2014   Atrial fibrillation with RVR ([place]) 03/28/2013   Diarrhea 03/28/2013   Hypothyroidism 03/24/2013   Syncope 03/24/2013   Glaucoma 03/24/2013   Hypokalemia 03/24/2013   Acute gastroenteritis 03/24/2013   PCP:  System, Provider Not In Pharmacy:  No Pharmacies Listed    Social Determinants of Health (SDOH) Interventions    Readmission Risk Interventions No flowsheet data found.

## 2020-07-29 NOTE — NC FL2 (Signed)
[place] MEDICAID FL2 LEVEL OF CARE SCREENING TOOL     IDENTIFICATION  Patient Name: Stephanie Donovan Birthdate: [birth year] Sex: female Admission Date (Current Location): 07/27/2020  [facility] and [place] Number:  Herbalist and Address:  [facility], [place] [address], [place],  [address]      Provider Number: [id number]  Attending Physician Name and Address:  Caren Griffins, MD  Relative Name and Phone Number:  Wess Botts, [phone]    Current Level of Care: Hospital Recommended Level of Care: [place] Prior Approval Number:    Date Approved/Denied:   PASRR Number: [id number]  Discharge Plan: SNF (ALF)    Current Diagnoses: Patient Active Problem List   Diagnosis Date Noted   Hardware complicating wound infection ([place])    Cellulitis 07/27/2020   Supracondylar fracture of right femur, closed, initial encounter ([place]) 01/01/2018   Closed fracture of right distal femur ([place]) 12/24/2017   Acute blood loss anemia 12/19/2017   Malnutrition of moderate degree 12/18/2017   Femur fracture ([place]) 12/17/2017   Fall 12/17/2017   HLD (hyperlipidemia) 12/17/2017   Chronic anemia 12/17/2017   Closed 4-part fracture of proximal humerus, right, initial encounter 10/09/2017   Paroxysmal A-fib ([place]) 09/21/2017   Closed displaced fracture of right femoral neck ([place]) 09/21/2017   Closed fracture of right proximal humerus 09/21/2017   Dehydration 09/21/2017   Hyponatremia 09/21/2017   Elevated CK 09/21/2017   Thyroid nodule 09/21/2017   Closed right hip fracture ([place]) 09/21/2017   Chronic diastolic CHF (congestive heart failure) ([place]) 09/21/2017   Sinus tachycardia 09/21/2017   Acute lower UTI 09/21/2017   TIA (transient ischemic attack) 08/25/2014   Aphasia 08/25/2014   Chronic constipation 05/19/2014   Fracture of multiple pubic rami ([place]) 04/11/2014   Pubic bone fracture ([place]) 04/11/2014   Left hip pain  04/11/2014   Atrial fibrillation with RVR ([place]) 03/28/2013   Diarrhea 03/28/2013   Hypothyroidism 03/24/2013   Syncope 03/24/2013   Glaucoma 03/24/2013   Hypokalemia 03/24/2013   Acute gastroenteritis 03/24/2013    Orientation RESPIRATION BLADDER Height & Weight     Self, Situation, Place  Normal Incontinent Weight: 138 lb 10.7 oz (62.9 kg) Height:  5\' 4"  (162.6 cm)  BEHAVIORAL SYMPTOMS/MOOD NEUROLOGICAL BOWEL NUTRITION STATUS      Incontinent Diet (See DC summary)  AMBULATORY STATUS COMMUNICATION OF NEEDS Skin   Total Care Verbally Skin abrasions (Bilateral arm, leg, abdomen)                       Personal Care Assistance Level of Assistance  Bathing, Feeding, Dressing Bathing Assistance: Maximum assistance Feeding assistance: Independent Dressing Assistance: Maximum assistance     Functional Limitations Info  Sight, Hearing, Speech Sight Info: Adequate Hearing Info: Adequate Speech Info: Adequate    SPECIAL CARE FACTORS FREQUENCY  PT (By licensed PT), OT (By licensed OT)     PT Frequency: 5x week OT Frequency: 5x week            Contractures Contractures Info: Not present    Additional Factors Info  Code Status, Allergies Code Status Info: DNR Allergies Info: Aspirin   Epinephrine   Penicillins   Codeine           Current Medications (07/29/2020):  This is the current hospital active medication list Current Facility-Administered Medications  Medication Dose Route Frequency Provider Last Rate Last Admin   acetaminophen (TYLENOL) tablet 650 mg  650  mg Oral Q6H PRN Irene Pap N, DO       brimonidine (ALPHAGAN) 0.2 % ophthalmic solution 1 drop  1 drop Left Eye BID Caren Griffins, MD   1 drop at 07/29/20 0955   brinzolamide (AZOPT) 1 % ophthalmic suspension 1 drop  1 drop Left Eye BID Caren Griffins, MD   1 drop at 07/29/20 0957   cefTRIAXone (ROCEPHIN) 2 g in sodium chloride 0.9 % 100 mL IVPB  2 g Intravenous Q24H Irene Pap N, DO 200 mL/hr at  07/28/20 2053 2 g at 07/28/20 2053   enoxaparin (LOVENOX) injection 40 mg  40 mg Subcutaneous Q24H Irene Pap N, DO   40 mg at 07/29/20 7903   ferrous sulfate tablet 325 mg  325 mg Oral BID WC Caren Griffins, MD       lactated ringers infusion   Intravenous Continuous Kayleen Memos, DO 30 mL/hr at 07/29/20 8333 Infusion Verify at 07/29/20 0616   levothyroxine (SYNTHROID) tablet 50 mcg  50 mcg Oral QAC breakfast Kayleen Memos, DO   50 mcg at 07/29/20 8329   melatonin tablet 3 mg  3 mg Oral QHS PRN Irene Pap N, DO       metoprolol succinate (TOPROL-XL) 24 hr tablet 12.5 mg  12.5 mg Oral Daily Irene Pap N, DO   12.5 mg at 07/29/20 0955   morphine 2 MG/ML injection 2 mg  2 mg Intravenous Q4H PRN Irene Pap N, DO   2 mg at 07/28/20 0207   multivitamin with minerals tablet 1 tablet  1 tablet Oral Daily Irene Pap N, DO   1 tablet at 07/29/20 0955   ondansetron (ZOFRAN) injection 4 mg  4 mg Intravenous Q6H PRN Irene Pap N, DO   4 mg at 07/29/20 0543   oxyCODONE (Oxy IR/ROXICODONE) immediate release tablet 5 mg  5 mg Oral Q6H PRN Irene Pap N, DO       polyethylene glycol (MIRALAX / GLYCOLAX) packet 17 g  17 g Oral Daily PRN Irene Pap N, DO       timolol (TIMOPTIC) 0.25 % ophthalmic solution 1 drop  1 drop Both Eyes BID Irene Pap N, DO   1 drop at 07/29/20 1916     Discharge Medications: Please see discharge summary for a list of discharge medications.  Relevant Imaging Results:  Relevant Lab Results:   Additional Information SS# [id number]  Coralee Pesa, [place]

## 2020-07-29 NOTE — Consult Note (Signed)
Palliative Care Consult Note                                  Date: 07/29/2020   Patient Name: Stephanie Donovan  DOB: 1927/01/30  MRN: [id number]  Age / Sex: 87 y.o., female  PCP: System, Provider Not In Referring Physician: Caren Griffins, MD  Reason for Consultation: Establishing goals of care  HPI/Patient Profile: 87 y.o. female  with past medical history of dementia, chronic back pain, atrial fibrillation, glaucoma, thyroid disease, previous TIA admitted on 07/27/2020 with abnormality of right distal femur hardware.  The patient was recently admitted and just discharged on 07/25/2020 for syncope, E. coli UTI.  She has been wheelchair-bound for about the past 2 years after fall x2.  X-ray in the ED of the right knee found worsening of surgical hardware failure with least 2 cm lateral migration of plate and screw, at least 1 fractured screw, refracture through the distal femoral shaft fracture and bone graft.  Initially was evaluated for orthopedic surgery but the patient declined surgery.  Current plan is for antibiotics, wound care, conservative care.  Past Medical History:  Diagnosis Date   Blood transfusion without reported diagnosis    Chronic back pain    Chronic constipation    Dysrhythmia    Afib with RVR - 2015, Oaroxyomal Afib   GI bleed    Glaucoma    Syncope 2015   Thyroid disease    TIA (transient ischemic attack) 2015    Social History   Socioeconomic History   Marital status: Widowed    Spouse name: Not on file   Number of children: Not on file   Years of education: Not on file   Highest education level: Not on file  Occupational History   Not on file  Tobacco Use   Smoking status: Never   Smokeless tobacco: Never  Vaping Use   Vaping Use: Never used  Substance and Sexual Activity   Alcohol use: No   Drug use: No   Sexual activity: Not Currently    Birth control/protection: Post-menopausal  Other  Topics Concern   Not on file  Social History Narrative   Not on file   Social Determinants of Health   Financial Resource Strain: Not on file  Food Insecurity: Not on file  Transportation Needs: Not on file  Physical Activity: Not on file  Stress: Not on file  Social Connections: Not on file    Family History  Problem Relation Age of Onset   Osteoporosis Mother    Cancer - Colon Father    Schizophrenia Son     Subjective:   This NP Walden Field reviewed medical records, received report from team, assessed the patient and then meet at the patient's bedside  to discuss diagnosis, prognosis, GOC, EOL wishes disposition and options.   Concept of Palliative Care was introduced as specialized medical care for people and their families living with serious illness.  If focuses on providing relief from the symptoms and stress of a serious illness.  The goal is to improve quality of life for both the patient and the family. Values and goals of care important to patient and family were attempted to be elicited.  Created space and opportunity for patient  and family to explore thoughts and feelings regarding current medical situation   Natural trajectory and expectations at EOL were discussed. Questions and concerns addressed.  Patient  encouraged to call with questions or concerns.    Life Review: The patient was born in [place].  She speaks [place] and [place].  She did work outside the home in [place].  Her hobbies include knitting which she enjoys.  She also enjoys classical music.  She learned to play piano while she was younger.  Patient Values: Family, hobbies, independence  Patient/Family Understanding of Illness: The patient did not seem to know very much about what was going on.  I reviewed a brief understanding of her current medical situation.  I was able to call her daughter Stephanie Donovan.  She states she previously had a UTI recently that is since resolved.  They noted her  knee hardware was protruding without puncturing through the skin and states into the emergency room.  She knows that she has declined surgery, and she is in agreement this.  They are planning for antibiotics and conservative management which she is in agreement with.  Currently has a bandage and a brace on.  She knows that she cannot walk.  She states that she had a fall x2 about 2 years ago.  Initial fall with a fractured shoulder and hip which she had surgery x2 and bounced back pretty well.  Her second fall resulted in leg injury and since then she has been unable to ambulate.  She does note that she has not been eating enough lately and wonders if her UTI was contributing to this.  Her daughter is hopeful that she will be able to go to a skilled nursing facility for 20 days through Medicare benefit and then transition back to [place] assisted living facility.  Review of Systems  Constitutional:  Positive for activity change and appetite change.  Respiratory:  Negative for cough, chest tightness and shortness of breath.   Cardiovascular:  Negative for chest pain.  Gastrointestinal:  Negative for abdominal pain, constipation, nausea and vomiting.  Musculoskeletal:        Right knee pain when sliding up in the bed   Objective:   Primary Diagnoses: Present on Admission:  Cellulitis   Scheduled Meds:  brimonidine  1 drop Left Eye BID   brinzolamide  1 drop Left Eye BID   enoxaparin (LOVENOX) injection  40 mg Subcutaneous Q24H   ferrous sulfate  325 mg Oral BID WC   levothyroxine  50 mcg Oral QAC breakfast   metoprolol succinate  12.5 mg Oral Daily   multivitamin with minerals  1 tablet Oral Daily   timolol  1 drop Both Eyes BID    Continuous Infusions:  cefTRIAXone (ROCEPHIN)  IV 2 g (07/28/20 2053)   lactated ringers 30 mL/hr at 07/29/20 0616    PRN Meds: acetaminophen, melatonin, morphine injection, ondansetron (ZOFRAN) IV, oxyCODONE, polyethylene glycol  Allergies   Allergen Reactions   Aspirin Palpitations and Other (See Comments)    Unspecified Mixed reactions   Epinephrine Other (See Comments)    Rapped heart beat   Penicillins Other (See Comments)    Lost hearing temporarily DID THE REACTION INVOLVE: Swelling of the face/tongue/throat, SOB, or low BP? No Sudden or severe rash/hives, skin peeling, or the inside of the mouth or nose? No Did it require medical treatment? No When did it last happen?       If all above answers are "NO", may proceed with cephalosporin use.    Codeine Palpitations    Physical Exam Vitals and nursing note reviewed.  Constitutional:      General: She  is not in acute distress.    Appearance: She is not toxic-appearing.  Cardiovascular:     Rate and Rhythm: Normal rate and regular rhythm.  Pulmonary:     Effort: Pulmonary effort is normal. No respiratory distress.     Breath sounds: Normal breath sounds. No wheezing.  Abdominal:     General: Abdomen is flat.     Palpations: Abdomen is soft.     Tenderness: There is no abdominal tenderness.  Musculoskeletal:        General: Deformity (Right knee) and signs of injury (Right knee) present.  Neurological:     General: No focal deficit present.     Mental Status: She is alert.  Psychiatric:        Mood and Affect: Mood normal.        Behavior: Behavior normal.    Vital Signs:  BP (!) 95/54 (BP Location: Right Arm)   Pulse 96   Temp 98 F (36.7 C) (Oral)   Resp 18   Ht 5\' 4"  (1.626 m)   Wt 62.9 kg   SpO2 97%   BMI 23.80 kg/m  Pain Scale: 0-10   Pain Score: 0-No pain  SpO2: SpO2: 97 % O2 Device:SpO2: 97 % O2 Flow Rate: .   IO: Intake/output summary:  Intake/Output Summary (Last 24 hours) at 07/29/2020 1421 Last data filed at 07/29/2020 1610 Gross per 24 hour  Intake 1168.66 ml  Output 1650 ml  Net -481.34 ml    LBM: Last BM Date: 07/28/20 Baseline Weight: Weight: 62.9 kg Most recent weight: Weight: 62.9 kg      Palliative Assessment/Data:  50%   Advanced Care Planning:   Primary Decision Maker: PATIENT with support of HCPOA  Code Status/Advance Care Planning: DNR  A discussion was had today regarding advanced directives. Concepts specific to code status, artifical feeding and hydration, continued IV antibiotics and rehospitalization was had.  The difference between a aggressive medical intervention path and a palliative comfort care path for this patient at this time was had. The MOST form was introduced and discussed with the patient and daughter. Decisions were made, but no MOST form completed.  Decisions/Changes to ACP: DNR No tube feeds Ok with hospitalization, IVF, antibiotics  Assessment & Plan:   I have reviewed the medical record, interviewed the patient and family, and examined the patient. The following aspects are pertinent.  Impression: Pleasant 87 year old female without extensive disease burden who presents for malfunctioned knee hardware protruding to the skin, but not through the skin.  She has been wheelchair-bound/nonambulatory for about 2 years.  She lives at [facility] assisted [facility] facility.  She is declined surgery/amputation at this time.  Goals were discussed and addressed.  The hope is for admission to skilled nursing facility for wound care and antibiotics and then eventual transition back to her assisted living facility.  SUMMARY OF RECOMMENDATIONS   Continue to treat the treatable Okay with antibiotics and wound care DNR as noted No tube feeds per patient wishes Continue PMT support  Symptom Management:  No palliative symptom management needs identified at this time We are available as needed for symptom management  Palliative Prophylaxis:  Frequent Pain Assessment  Additional Recommendations (Limitations, Scope, Preferences): No Artificial Feeding and No Surgical Procedures  Psycho-social/Spiritual:  Additional Recommendations: Caregiving  Support/Resources  Prognosis:  Unable  to determine  Discharge Planning:  To Be Determined   Discussed with: Daughter    Thank you for allowing [place] to participate in the care of  Stephanie Donovan PMT will continue to support holistically.  Time In: 2:45 Time Out: 4:00 Time Total: 75 min  Greater than 50%  of this time was spent counseling and coordinating care related to the above assessment and plan.  Signed by: Walden Field, NP Palliative Medicine Team  Team Phone # [phone] (Nights/Weekends)  07/29/2020, 2:21 PM

## 2020-07-29 NOTE — Progress Notes (Addendum)
PROGRESS NOTE  Stephanie Donovan UXN:[id number] DOB: [birth year] DOA: 07/27/2020 PCP: System, Provider Not In   LOS: 2 days   Brief Narrative / Interim history: 88 year old female with history of dementia, hypothyroidism, HTN, who was recently hospitalized and discharged on 7/4 after being admitted for syncope and E. coli UTI comes in with an abnormality of the right distal femur associated with hardware failure.  She has a history of knee replacement, at baseline she is nonambulatory and uses a wheelchair.  It was noted that there was some metal protruding from her knee at the SNF as well as purulent discharge.  X-ray in the ED revealed worsening of the surgical hardware failure with at least 2 cm laceration migration of plate and screw fixation along the distal femur.  Orthopedic was consulted and she was admitted to the hospital  Subjective / 24h Interval events: Has no complaints this morning, eating breakfast.  Adamant that she does not want to have surgery to her knee.  Assessment & Plan: Principal Problem Right knee cellulitis, concern for hardware infection, POA, history of right distal femur fracture/dislocation-orthopedic surgery consulted.  There were initially plans in place for patient to get AKA by Dr. Sharol Given on 7/8 however patient does not want surgery despite the daughter's initial wishes for surgery.  Given patient's refusal surgery has been canceled.  Daughter is now on the same page as her mother. -Disposition may be an issue since her [name] is not comfortable with her knee issues.  Awaiting PT eval, social worker to check with [facility] with that she can return there  Active Problems Recent E. coli UTI-currently on Rocephin for cellulitis.  Continue up until discharge, then will transition to doxycycline for a month as per orthopedic surgery  Mild euvolemic hyponatremia-sodium stable  Hypothyroidism -continue Synthroid   Essential hypertension-continue home  metoprolol, blood pressure stable   Vitamin B12 deficiency -most recent B12 3338, successfully repleted as an outpatient   Ambulatory dysfunction -She is wheelchair-bound  Mild normocytic anemia-in the setting of chronic infection.  Goals of care-discussed with daughter over the phone regarding CODE STATUS, changed to DNR  Scheduled Meds:  brimonidine  1 drop Left Eye BID   brinzolamide  1 drop Left Eye BID   enoxaparin (LOVENOX) injection  40 mg Subcutaneous Q24H   ferrous sulfate  325 mg Oral BID WC   levothyroxine  50 mcg Oral QAC breakfast   metoprolol succinate  12.5 mg Oral Daily   multivitamin with minerals  1 tablet Oral Daily   timolol  1 drop Both Eyes BID   Continuous Infusions:  cefTRIAXone (ROCEPHIN)  IV 2 g (07/28/20 2053)   lactated ringers 30 mL/hr at 07/29/20 0616   PRN Meds:.acetaminophen, melatonin, morphine injection, ondansetron (ZOFRAN) IV, oxyCODONE, polyethylene glycol  Diet Orders (From admission, onward)     Start     Ordered   07/29/20 0851  Diet regular Room service appropriate? Yes; Fluid consistency: Thin  Diet effective now       Question Answer Comment  Room service appropriate? Yes   Fluid consistency: Thin      07/29/20 0850            DVT prophylaxis: enoxaparin (LOVENOX) injection 40 mg Start: 07/28/20 0800 SCDs Start: 07/27/20 2116     Code Status: DNR  Family Communication: no family at bedside, discussed with daughter over the phone  Status is: Inpatient  Remains inpatient appropriate because:Inpatient level of care appropriate due to severity of  illness  Dispo:  Patient From: Home  Planned Disposition: Home with Health Care Svc  Medically stable for discharge: No    Level of care: Telemetry Surgical  Consultants:  Orthopedic surgery  Procedures:  none  Microbiology  Blood culture 7/6-no growth to date  Antimicrobials: Ceftriaxone 7/6   Objective: Vitals:   07/29/20 0020 07/29/20 0620 07/29/20 0757  07/29/20 1118  BP: 140/77 133/73 133/67 (!) 95/54  Pulse: 83 88 86 96  Resp: 17 17 16 18   Temp: 97.6 F (36.4 C) (!) 97.5 F (36.4 C) 98 F (36.7 C) 98 F (36.7 C)  TempSrc:  Oral Oral Oral  SpO2: 95% 92% 94% 97%  Weight:      Height:        Intake/Output Summary (Last 24 hours) at 07/29/2020 1147 Last data filed at 07/29/2020 5009 Gross per 24 hour  Intake 1648.66 ml  Output 1875 ml  Net -226.34 ml    Filed Weights   07/28/20 0150  Weight: 62.9 kg    Examination:  Constitutional: NAD Eyes: No icterus ENMT: mmm Neck: normal, supple Respiratory: Clear bilaterally, no wheezing or crackles Cardiovascular: Regular rate and rhythm, no murmurs, no edema Abdomen: Soft, NT, ND, bowel sounds positive Musculoskeletal: no clubbing / cyanosis.  Skin: no rashes, small piece of metal protruding from the right knee Neurologic: No focal deficits  Data Reviewed: I have independently reviewed following labs and imaging studies   CBC: Recent Labs  Lab 07/23/20 0409 07/24/20 0409 07/25/20 0253 07/26/20 1223 07/27/20 1914 07/28/20 0658 07/29/20 0116  WBC 14.0* 9.9 7.6 6.9 8.3 7.3 7.3  NEUTROABS 13.0* 7.4 4.6 4.4 5.6  --   --   HGB 11.6* 9.8* 10.0* 10.0* 10.7* 10.6* 9.5*  HCT 35.6* 30.7* 31.0* 31.2* 33.8* 33.1* 29.7*  MCV 89.2 89.5 89.1 94.3 93.1 90.7 90.8  PLT 243 222 220 248 263 259 381    Basic Metabolic Panel: Recent Labs  Lab 07/22/20 1501 07/22/20 1532 07/23/20 0608 07/24/20 0409 07/25/20 0253 07/26/20 1223 07/27/20 1914 07/27/20 2149 07/28/20 0658 07/29/20 0116  NA  --    < > 133* 131*  --  133* 134*  --  134* 134*  K  --    < > 4.7 3.6  --  4.1 4.1  --  4.0 4.4  CL  --    < > 101 99  --  100 99  --  101 101  CO2  --    < > 22 23  --  26 27  --  28 27  GLUCOSE  --    < > 127* 107*  --  106* 106*  --  97 101*  BUN  --    < > 19 19  --  11 9  --  8 6*  CREATININE  --    < > 0.81 0.60  --  0.54 0.57 0.52 0.57 0.66  CALCIUM  --    < > 8.7* 8.3*  --  8.3* 8.6*   --  8.6* 8.3*  MG 2.0  --  1.9 1.9 1.8  --   --   --  1.9  --   PHOS  --   --   --   --   --   --   --   --  3.3  --    < > = values in this interval not displayed.    Liver Function Tests: Recent Labs  Lab 07/23/20 8299 07/26/20 1223 07/28/20  0658  AST 24 18 11*  ALT 14 19 15   ALKPHOS 99 77 80  BILITOT 0.9 0.8 0.6  PROT 6.6 5.7* 6.0*  ALBUMIN 2.8* 2.5* 2.6*    Coagulation Profile: Recent Labs  Lab 07/28/20 0658  INR 1.1    HbA1C: No results for input(s): HGBA1C in the last 72 hours. CBG: Recent Labs  Lab 07/24/20 1224  GLUCAP 97     Recent Results (from the past 240 hour(s))  Urine Culture     Status: Abnormal   Collection Time: 07/22/20  2:42 PM   Specimen: Urine, Clean Catch  Result Value Ref Range Status   Specimen Description URINE, CLEAN CATCH  Final   Special Requests   Final    NONE Performed at [facility], [place] [address], [place], [place] [address]    Culture 80,000 COLONIES/mL ESCHERICHIA COLI (A)  Final   Report Status 07/24/2020 FINAL  Final   Organism ID, Bacteria ESCHERICHIA COLI (A)  Final      Susceptibility   Escherichia coli - MIC*    AMPICILLIN <=2 SENSITIVE Sensitive     CEFAZOLIN <=4 SENSITIVE Sensitive     CEFEPIME <=0.12 SENSITIVE Sensitive     CEFTRIAXONE <=0.25 SENSITIVE Sensitive     CIPROFLOXACIN <=0.25 SENSITIVE Sensitive     GENTAMICIN <=1 SENSITIVE Sensitive     IMIPENEM <=0.25 SENSITIVE Sensitive     NITROFURANTOIN <=16 SENSITIVE Sensitive     TRIMETH/SULFA <=20 SENSITIVE Sensitive     AMPICILLIN/SULBACTAM <=2 SENSITIVE Sensitive     PIP/TAZO <=4 SENSITIVE Sensitive     * 80,000 COLONIES/mL ESCHERICHIA COLI  SARS CORONAVIRUS 2 (TAT 6-24 HRS) Nasopharyngeal Nasopharyngeal Swab     Status: None   Collection Time: 07/22/20  4:03 PM   Specimen: Nasopharyngeal Swab  Result Value Ref Range Status   SARS Coronavirus 2 NEGATIVE NEGATIVE Final    Comment: (NOTE) SARS-CoV-2 target nucleic acids are NOT  DETECTED.  The SARS-CoV-2 RNA is generally detectable in upper and lower respiratory specimens during the acute phase of infection. Negative results do not preclude SARS-CoV-2 infection, do not rule out co-infections with other pathogens, and should not be used as the sole basis for treatment or other patient management decisions. Negative results must be combined with clinical observations, patient history, and epidemiological information. The expected result is Negative.  Fact Sheet for Patients: SugarRoll.be  Fact Sheet for Healthcare Providers: https://www.woods-mathews.com/  This test is not yet approved or cleared by the [place] FDA and  has been authorized for detection and/or diagnosis of SARS-CoV-2 by FDA under an Emergency Use Authorization (EUA). This EUA will remain  in effect (meaning this test can be used) for the duration of the COVID-19 declaration under Se ction 564(b)(1) of the Act, 21 U.S.C. section 360bbb-3(b)(1), unless the authorization is terminated or revoked sooner.  Performed at [facility], [place] [address], [place], [place] [address]   Resp Panel by RT-PCR (Flu A&B, Covid) Nasopharyngeal Swab     Status: None   Collection Time: 07/25/20 11:22 AM   Specimen: Nasopharyngeal Swab; Nasopharyngeal(NP) swabs in vial transport medium  Result Value Ref Range Status   SARS Coronavirus 2 by RT PCR NEGATIVE NEGATIVE Final    Comment: (NOTE) SARS-CoV-2 target nucleic acids are NOT DETECTED.  The SARS-CoV-2 RNA is generally detectable in upper respiratory specimens during the acute phase of infection. The lowest concentration of SARS-CoV-2 viral copies this assay can detect is 138 copies/mL. A negative result  does not preclude SARS-Cov-2 infection and should not be used as the sole basis for treatment or other patient management decisions. A negative result may occur with  improper specimen  collection/handling, submission of specimen other than nasopharyngeal swab, presence of viral mutation(s) within the areas targeted by this assay, and inadequate number of viral copies(<138 copies/mL). A negative result must be combined with clinical observations, patient history, and epidemiological information. The expected result is Negative.  Fact Sheet for Patients:  EntrepreneurPulse.com.au  Fact Sheet for Healthcare Providers:  IncredibleEmployment.be  This test is no t yet approved or cleared by the [place] FDA and  has been authorized for detection and/or diagnosis of SARS-CoV-2 by FDA under an Emergency Use Authorization (EUA). This EUA will remain  in effect (meaning this test can be used) for the duration of the COVID-19 declaration under Section 564(b)(1) of the Act, 21 U.S.C.section 360bbb-3(b)(1), unless the authorization is terminated  or revoked sooner.       Influenza A by PCR NEGATIVE NEGATIVE Final   Influenza B by PCR NEGATIVE NEGATIVE Final    Comment: (NOTE) The Xpert Xpress SARS-CoV-2/FLU/RSV plus assay is intended as an aid in the diagnosis of influenza from Nasopharyngeal swab specimens and should not be used as a sole basis for treatment. Nasal washings and aspirates are unacceptable for Xpert Xpress SARS-CoV-2/FLU/RSV testing.  Fact Sheet for Patients: EntrepreneurPulse.com.au  Fact Sheet for Healthcare Providers: IncredibleEmployment.be  This test is not yet approved or cleared by the [place] FDA and has been authorized for detection and/or diagnosis of SARS-CoV-2 by FDA under an Emergency Use Authorization (EUA). This EUA will remain in effect (meaning this test can be used) for the duration of the COVID-19 declaration under Section 564(b)(1) of the Act, 21 U.S.C. section 360bbb-3(b)(1), unless the authorization is terminated or revoked.  Performed at [facility], [place] [address], [place], [place] [address]   Urine culture     Status: Abnormal   Collection Time: 07/26/20  1:44 PM   Specimen: Urine, Clean Catch  Result Value Ref Range Status   Specimen Description URINE, CLEAN CATCH  Final   Special Requests   Final    NONE Performed at [facility], [place] [address], [place], [place] [address]    Culture MULTIPLE SPECIES PRESENT, SUGGEST RECOLLECTION (A)  Final   Report Status 07/27/2020 FINAL  Final  Resp Panel by RT-PCR (Flu A&B, Covid) Nasopharyngeal Swab     Status: None   Collection Time: 07/27/20  7:36 PM   Specimen: Nasopharyngeal Swab; Nasopharyngeal(NP) swabs in vial transport medium  Result Value Ref Range Status   SARS Coronavirus 2 by RT PCR NEGATIVE NEGATIVE Final    Comment: (NOTE) SARS-CoV-2 target nucleic acids are NOT DETECTED.  The SARS-CoV-2 RNA is generally detectable in upper respiratory specimens during the acute phase of infection. The lowest concentration of SARS-CoV-2 viral copies this assay can detect is 138 copies/mL. A negative result does not preclude SARS-Cov-2 infection and should not be used as the sole basis for treatment or other patient management decisions. A negative result may occur with  improper specimen collection/handling, submission of specimen other than nasopharyngeal swab, presence of viral mutation(s) within the areas targeted by this assay, and inadequate number of viral copies(<138 copies/mL). A negative result must be combined with clinical observations, patient history, and epidemiological information. The expected result is Negative.  Fact Sheet for Patients:  EntrepreneurPulse.com.au  Fact Sheet for Healthcare Providers:  IncredibleEmployment.be  This test is no t yet approved or cleared by the [place] and  has been authorized for detection and/or diagnosis of SARS-CoV-2 by FDA under an Emergency Use Authorization (EUA).  This EUA will remain  in effect (meaning this test can be used) for the duration of the COVID-19 declaration under Section 564(b)(1) of the Act, 21 U.S.C.section 360bbb-3(b)(1), unless the authorization is terminated  or revoked sooner.       Influenza A by PCR NEGATIVE NEGATIVE Final   Influenza B by PCR NEGATIVE NEGATIVE Final    Comment: (NOTE) The Xpert Xpress SARS-CoV-2/FLU/RSV plus assay is intended as an aid in the diagnosis of influenza from Nasopharyngeal swab specimens and should not be used as a sole basis for treatment. Nasal washings and aspirates are unacceptable for Xpert Xpress SARS-CoV-2/FLU/RSV testing.  Fact Sheet for Patients: EntrepreneurPulse.com.au  Fact Sheet for Healthcare Providers: IncredibleEmployment.be  This test is not yet approved or cleared by the [place] FDA and has been authorized for detection and/or diagnosis of SARS-CoV-2 by FDA under an Emergency Use Authorization (EUA). This EUA will remain in effect (meaning this test can be used) for the duration of the COVID-19 declaration under Section 564(b)(1) of the Act, 21 U.S.C. section 360bbb-3(b)(1), unless the authorization is terminated or revoked.  Performed at [facility], [place] [address], [place], [place] [address]   Culture, blood (routine x 2)     Status: None (Preliminary result)   Collection Time: 07/27/20  7:58 PM   Specimen: BLOOD  Result Value Ref Range Status   Specimen Description BLOOD RIGHT ANTECUBITAL  Final   Special Requests   Final    BOTTLES DRAWN AEROBIC AND ANAEROBIC Blood Culture results may not be optimal due to an inadequate volume of blood received in culture bottles   Culture   Final    NO GROWTH 2 DAYS Performed at [facility], [place] [address], [place], [place] [address]    Report Status PENDING  Incomplete  Culture, blood (routine x 2)     Status: None (Preliminary result)   Collection Time: 07/27/20   8:42 PM   Specimen: BLOOD  Result Value Ref Range Status   Specimen Description BLOOD SITE NOT SPECIFIED  Final   Special Requests   Final    BOTTLES DRAWN AEROBIC AND ANAEROBIC Blood Culture results may not be optimal due to an inadequate volume of blood received in culture bottles   Culture   Final    NO GROWTH 2 DAYS Performed at [facility], [place] [address] [place], [place] [address]    Report Status PENDING  Incomplete      Radiology Studies: No results found.  Marzetta Board, MD, PhD [facility]  Between 7 am - 7 pm I am available, please contact me via Amion (for emergencies) or Securechat (non urgent messages)  Between 7 pm - 7 am I am not available, please contact night coverage MD/APP via Amion

## 2020-07-29 NOTE — Progress Notes (Signed)
Patient ID: Stephanie Donovan, female   DOB: 1927-11-27, 87 y.o.   MRN: [id number] Reviewed with patient this morning, clinical findings and surgical options.  Patient states again that she does not want to proceed with surgery.  She would prefer oral antibiotics and wound care.  I also had several discussions with patient's daughter yesterday who wanted to proceed with the above-the-knee amputation and states that she has the power of attorney.  I discussed with the daughter that we would not proceed with surgery unless the patient agreed to surgery, since the patient is alert oriented and able to make her own decisions.  I will follow-up as needed.  I have dictated a note in the consult, recommending, discharge back to skilled nursing with wound care and oral antibiotics.  Would recommend doxycycline for 1 month.

## 2020-07-29 NOTE — Evaluation (Signed)
Physical Therapy Evaluation Patient Details Name: Stephanie Donovan MRN: [id number] DOB: 03-May-1927 Today's Date: 07/29/2020   History of Present Illness  87 year old female with history of dementia, hypothyroidism, HTN, who was recently hospitalized and discharged on 7/4 after being admitted for syncope and E. coli UTI comes in with an abnormality of the right distal femur associated with hardware failure.  She has a history of knee replacement, at baseline she is nonambulatory and uses a wheelchair.  It was noted that there was some metal protruding from her knee at the SNF as well as purulent discharge.  X-ray in the ED revealed worsening of the surgical hardware failure with at least 2 cm laceration migration of plate and screw fixation along the distal femur.  Orthopedic was consulted and she was admitted to the hospital. Patient refusing surgery.    Clinical Impression  Patient received in bed, asking what she is doing here. Patient reports right knee pain with movement. Hardware is coming out through skin. Patient requires min assist for bed mobility and max assist for squat pivot from bed>< recliner. She attempted to stand with RW, but was unable to tolerate due to right knee pain. Patient will continue to benefit from skilled PT while here to improve mobility and functional independence to return to baseline.     Follow Up Recommendations SNF;Supervision for mobility/OOB    Equipment Recommendations  None recommended by PT    Recommendations for Other Services       Precautions / Restrictions Precautions Precautions: Fall Restrictions Weight Bearing Restrictions: No      Mobility  Bed Mobility Overal bed mobility: Needs Assistance Bed Mobility: Supine to Sit;Sit to Supine     Supine to sit: Min assist Sit to supine: Min assist   General bed mobility comments: patient requires assistance with R LE due to pain    Transfers Overall transfer level: Needs  assistance Equipment used: Rolling walker (2 wheeled);None Transfers: Sit to/from [employer] Sit to Stand: Mod assist   Squat pivot transfers: Max assist     General transfer comment: patient attempted to stand with RW, was unable due to pain in right knee. Therefore performed spt with max assist to recliner and back to bed.  Ambulation/Gait             General Gait Details: patient non ambulatory at baseline  Stairs            Wheelchair Mobility    Modified Rankin (Stroke Patients Only)       Balance Overall balance assessment: Needs assistance Sitting-balance support: Feet supported Sitting balance-Leahy Scale: Fair     Standing balance support: During functional activity Standing balance-Leahy Scale: Poor Standing balance comment: requires max assist for spt, unable to tolerate standing with walker and assistance.                             Pertinent Vitals/Pain Pain Assessment: Faces Faces Pain Scale: Hurts whole lot Pain Location: R knee with movement or attempted WB Pain Descriptors / Indicators: Discomfort;Grimacing;Guarding;Sore Pain Intervention(s): Limited activity within patient's tolerance;Monitored during session;Repositioned    Home Living Family/patient expects to be discharged to:: Skilled nursing facility                      Prior Function           Comments: patient reports she was able to transfer to wheelchair independently, but  unsure if this is accurate     Hand Dominance        Extremity/Trunk Assessment   Upper Extremity Assessment Upper Extremity Assessment: Generalized weakness    Lower Extremity Assessment Lower Extremity Assessment: Generalized weakness;RLE deficits/detail RLE: Unable to fully assess due to pain RLE Coordination: decreased gross motor    Cervical / Trunk Assessment Cervical / Trunk Assessment: Kyphotic  Communication   Communication: No difficulties   Cognition Arousal/Alertness: Awake/alert Behavior During Therapy: WFL for tasks assessed/performed Overall Cognitive Status: History of cognitive impairments - at baseline                                 General Comments: patient states "what am I doing here?"      General Comments      Exercises     Assessment/Plan    PT Assessment Patient needs continued PT services  PT Problem List Decreased strength;Decreased mobility;Decreased activity tolerance;Decreased balance;Pain;Decreased safety awareness;Decreased knowledge of use of DME;Decreased cognition       PT Treatment Interventions DME instruction;Therapeutic exercise;Functional mobility training;Therapeutic activities;Patient/family education    PT Goals (Current goals can be found in the Care Plan section)  Acute Rehab PT Goals Patient Stated Goal: to return home PT Goal Formulation: With patient Time For Goal Achievement: 08/12/20 Potential to Achieve Goals: Good    Frequency Min 2X/week   Barriers to discharge        Co-evaluation               AM-PAC PT "6 Clicks" Mobility  Outcome Measure Help needed turning from your back to your side while in a flat bed without using bedrails?: A Little Help needed moving from lying on your back to sitting on the side of a flat bed without using bedrails?: A Little Help needed moving to and from a bed to a chair (including a wheelchair)?: A Lot Help needed standing up from a chair using your arms (e.g., wheelchair or bedside chair)?: A Lot Help needed to walk in hospital room?: Total Help needed climbing 3-5 steps with a railing? : Total 6 Click Score: 12    End of Session Equipment Utilized During Treatment: Gait belt Activity Tolerance: Patient limited by pain Patient left: in bed;with call bell/phone within reach;with bed alarm set;with SCD's reapplied Nurse Communication: Mobility status PT Visit Diagnosis: Muscle weakness (generalized)  (M62.81);Pain;Other abnormalities of gait and mobility (R26.89) Pain - Right/Left: Right Pain - part of body: Knee    Time: 9357-0177 PT Time Calculation (min) (ACUTE ONLY): 19 min   Charges:   PT Evaluation $PT Eval Moderate Complexity: 1 Mod PT Treatments $Therapeutic Activity: 8-22 mins        Idriss Quackenbush, PT, GCS 07/29/20,2:15 PM

## 2020-07-30 DIAGNOSIS — Z515 Encounter for palliative care: Secondary | ICD-10-CM | POA: Diagnosis not present

## 2020-07-30 DIAGNOSIS — L03115 Cellulitis of right lower limb: Secondary | ICD-10-CM | POA: Diagnosis not present

## 2020-07-30 DIAGNOSIS — Z7189 Other specified counseling: Secondary | ICD-10-CM | POA: Diagnosis not present

## 2020-07-30 DIAGNOSIS — T847XXA Infection and inflammatory reaction due to other internal orthopedic prosthetic devices, implants and grafts, initial encounter: Secondary | ICD-10-CM | POA: Diagnosis not present

## 2020-07-30 LAB — BASIC METABOLIC PANEL
Anion gap: 4 — ABNORMAL LOW (ref 5–15)
BUN: 8 mg/dL (ref 8–23)
CO2: 28 mmol/L (ref 22–32)
Calcium: 8.6 mg/dL — ABNORMAL LOW (ref 8.9–10.3)
Chloride: 102 mmol/L (ref 98–111)
Creatinine, Ser: 0.55 mg/dL (ref 0.44–1.00)
GFR, Estimated: >60 mL/min (ref >60)
Glucose, Bld: 103 mg/dL — ABNORMAL HIGH (ref 70–99)
Potassium: 4.5 mmol/L (ref 3.5–5.1)
Sodium: 134 mmol/L — ABNORMAL LOW (ref 135–145)

## 2020-07-30 LAB — CBC
HCT: 33.1 % — ABNORMAL LOW (ref 36.0–46.0)
Hemoglobin: 10.8 g/dL — ABNORMAL LOW (ref 12.0–15.0)
MCH: 29.7 pg (ref 26.0–34.0)
MCHC: 32.6 g/dL (ref 30.0–36.0)
MCV: 90.9 fL (ref 80.0–100.0)
Platelets: 329 10*3/uL (ref 150–400)
RBC: 3.64 MIL/uL — ABNORMAL LOW (ref 3.87–5.11)
RDW: 14.2 % (ref 11.5–15.5)
WBC: 8.5 10*3/uL (ref 4.0–10.5)
nRBC: 0 % (ref 0.0–0.2)

## 2020-07-30 LAB — MAGNESIUM: Magnesium: 2 mg/dL (ref 1.7–2.4)

## 2020-07-30 NOTE — Progress Notes (Signed)
Daily Progress Note   Patient Name: Stephanie Donovan       Date: 07/30/2020 DOB: 1927/11/02  Age: 87 y.o. MRN#: [id number] Attending Physician: Caren Griffins, MD Primary Care Physician: System, Provider Not In Admit Date: 07/27/2020 Length of Stay: 3 days  Reason for Consultation/Follow-up: Disposition and Establishing goals of care  HPI/Patient Profile:  87 y.o. female  with past medical history of dementia, chronic back pain, atrial fibrillation, glaucoma, thyroid disease, previous TIA admitted on 07/27/2020 with abnormality of right distal femur hardware.  The patient was recently admitted and just discharged on 07/25/2020 for syncope, E. coli UTI.  She has been wheelchair-bound for about the past 2 years after fall x2.  X-ray in the ED of the right knee found worsening of surgical hardware failure with least 2 cm lateral migration of plate and screw, at least 1 fractured screw, refracture through the distal femoral shaft fracture and bone graft.  Initially was evaluated for orthopedic surgery but the patient declined surgery.   Current plan is for antibiotics, wound care, conservative care.  Current Medications: Scheduled Meds:   brimonidine  1 drop Left Eye BID   brinzolamide  1 drop Left Eye BID   enoxaparin (LOVENOX) injection  40 mg Subcutaneous Q24H   ferrous sulfate  325 mg Oral BID WC   levothyroxine  50 mcg Oral QAC breakfast   metoprolol succinate  12.5 mg Oral Daily   multivitamin with minerals  1 tablet Oral Daily   timolol  1 drop Both Eyes BID    Continuous Infusions:  cefTRIAXone (ROCEPHIN)  IV 2 g (07/29/20 2124)    PRN Meds: acetaminophen, melatonin, morphine injection, ondansetron (ZOFRAN) IV, oxyCODONE, polyethylene glycol  Subjective:   Subjective: Chart Reviewed. Updates received. Patient Assessed. Created space and opportunity for patient  and family to explore thoughts and feelings regarding current medical situation.  Today's Discussion: The patient  states she is doing well.  Her leg is not hurting her right now.  She ate most of her lunch.  We discussed her daughter's upcoming wedding and she is excited for this.  She states she found herself a good man.  She is also excited about the possibility of her granddaughter coming for the wedding from [place].  She is excited to be able to see the mall.  She is hoping she will be well enough to go.  She seemed to really enjoy talking about this.  She asked when she would be able to go home and we discussed the plan to get admission to skilled nursing facility for wound care for a brief period of time and then hopefully eventually transition back to her assisted living facility at [facility].  She seemed to agree with this.  She appeared to have some short-term memory issues/forgetfulness today.  Otherwise seems comfortable.  Denies pain, dyspnea, nausea, vomiting.  Review of Systems  Constitutional:  Negative for appetite change.       Denies pain  Respiratory:  Negative for cough and shortness of breath.   Cardiovascular:  Negative for chest pain.  Gastrointestinal:  Negative for abdominal pain, nausea and vomiting.   Objective:   Vital Signs: BP 125/76 (BP Location: Right Arm)   Pulse 89   Temp 98.1 F (36.7 C) (Oral)   Resp 16   Ht 5\' 4"  (1.626 m)   Wt 62.9 kg   SpO2 98%   BMI 23.80 kg/m  SpO2: SpO2: 98 % O2 Device: O2 Device: Room Air O2  Flow Rate:    Physical Exam: Physical Exam Vitals and nursing note reviewed.  Constitutional:      General: She is not in acute distress.    Appearance: Normal appearance. She is not toxic-appearing.  HENT:     Head: Normocephalic and atraumatic.  Cardiovascular:     Rate and Rhythm: Normal rate and regular rhythm.  Pulmonary:     Effort: Pulmonary effort is normal. No respiratory distress.     Breath sounds: Normal breath sounds. No wheezing.  Abdominal:     General: Abdomen is flat.     Palpations: Abdomen is soft.     Tenderness:  There is no abdominal tenderness.  Skin:    General: Skin is warm and dry.  Neurological:     General: No focal deficit present.     Mental Status: She is alert.  Psychiatric:        Mood and Affect: Mood normal.        Behavior: Behavior normal.    SpO2: SpO2: 98 % O2 Device:SpO2: 98 % O2 Flow Rate: .   IO: Intake/output summary:  Intake/Output Summary (Last 24 hours) at 07/30/2020 1440 Last data filed at 07/30/2020 0545 Gross per 24 hour  Intake 525 ml  Output 400 ml  Net 125 ml    LBM: Last BM Date: 07/28/20 Baseline Weight: Weight: 62.9 kg Most recent weight: Weight: 62.9 kg   Palliative Assessment/Data: 40%   Assessment & Plan:   Impression:  Pleasant 87 year old female without extensive disease burden who presents for malfunctioned knee hardware protruding to the skin, but not through the skin.  She has been wheelchair-bound/nonambulatory for about 2 years.  She lives at [facility] assisted [facility] facility.  She is declined surgery/amputation at this time.  Goals were discussed and addressed.  The hope is for admission to skilled nursing facility for wound care and antibiotics and then eventual transition back to her assisted living facility.  SUMMARY OF RECOMMENDATIONS   Continue to treat the treatable Okay with antibiotics and wound care DNR as noted No tube feeds per patient wishes Continue PMT support  Code Status: DNR  Symptom Management: No palliative symptom management needs identified at this time We are available as needed for symptom management  Prognosis: Unable to determine  Discharge Planning:  SNF for wound care, eventually back to ALF  Discussed with: Patient  Thank you for allowing [place] to participate in the care of Stephanie Donovan PMT will continue to support holistically.  Time Total: 45 min  Visit consisted of counseling and education dealing with the complex and emotionally intense issues of symptom management and palliative care in  the setting of serious and potentially life-threatening illness. Greater than 50%  of this time was spent counseling and coordinating care related to the above assessment and plan.  Walden Field, NP Palliative Medicine Team  Team Phone # [phone] (Nights/Weekends)  07/30/2020, 2:40 PM

## 2020-07-30 NOTE — Progress Notes (Addendum)
Per Tele personnel, Patient had 13 beats of SVT. Patient is asymptomatic, MD notified. Will continue to monitor.  1:38 Provider notified about patient having 7 beats of vtach. Lab order. Will continue to monitor.

## 2020-07-30 NOTE — Progress Notes (Signed)
PROGRESS NOTE  REGINNA SERMENO MBW:[id number] DOB: 1927-04-26 DOA: 07/27/2020 PCP: System, Provider Not In   LOS: 3 days   Brief Narrative / Interim history: 87 year old female with history of dementia, hypothyroidism, HTN, who was recently hospitalized and discharged on 7/4 after being admitted for syncope and E. coli UTI comes in with an abnormality of the right distal femur associated with hardware failure.  She has a history of knee replacement, at baseline she is nonambulatory and uses a wheelchair.  It was noted that there was some metal protruding from her knee at the SNF as well as purulent discharge.  X-ray in the ED revealed worsening of the surgical hardware failure with at least 2 cm laceration migration of plate and screw fixation along the distal femur.  Orthopedic was consulted and she was admitted to the hospital  Subjective / 24h Interval events: Sleepy this morning, no complaints  Assessment & Plan: Principal Problem Right knee cellulitis, concern for hardware infection, POA, history of right distal femur fracture/dislocation-orthopedic surgery consulted.  There were initially plans in place for patient to get AKA by Dr. Sharol Given on 7/8 however patient does not want surgery despite the daughter's initial wishes for surgery.  Given patient's refusal surgery has been canceled.  Daughter is now on the same page as her mother. -PT recommends SNF, placement pending  Active Problems Recent E. coli UTI-currently on Rocephin for cellulitis.  Continue up until discharge, then will transition to doxycycline for a month as per orthopedic surgery  Mild euvolemic hyponatremia-sodium stable today  Hypothyroidism -continue Synthroid   Essential hypertension-continue home metoprolol, blood pressure stable   Vitamin B12 deficiency -most recent B12 3338, successfully repleted as an outpatient   Ambulatory dysfunction -She is wheelchair-bound  Mild normocytic anemia-in the setting of  chronic infection.  Hemoglobin stable today  Goals of care-now DNR.  Palliative following.  Scheduled Meds:  brimonidine  1 drop Left Eye BID   brinzolamide  1 drop Left Eye BID   enoxaparin (LOVENOX) injection  40 mg Subcutaneous Q24H   ferrous sulfate  325 mg Oral BID WC   levothyroxine  50 mcg Oral QAC breakfast   metoprolol succinate  12.5 mg Oral Daily   multivitamin with minerals  1 tablet Oral Daily   timolol  1 drop Both Eyes BID   Continuous Infusions:  cefTRIAXone (ROCEPHIN)  IV 2 g (07/29/20 2124)   PRN Meds:.acetaminophen, melatonin, morphine injection, ondansetron (ZOFRAN) IV, oxyCODONE, polyethylene glycol  Diet Orders (From admission, onward)     Start     Ordered   07/29/20 0851  Diet regular Room service appropriate? Yes; Fluid consistency: Thin  Diet effective now       Question Answer Comment  Room service appropriate? Yes   Fluid consistency: Thin      07/29/20 0850            DVT prophylaxis: enoxaparin (LOVENOX) injection 40 mg Start: 07/28/20 0800 SCDs Start: 07/27/20 2116     Code Status: DNR  Family Communication: no family at bedside, discussed with daughter over the phone  Status is: Inpatient  Remains inpatient appropriate because:Inpatient level of care appropriate due to severity of illness  Dispo:  Patient From: Home  Planned Disposition: Home with Health Care Svc  Medically stable for discharge: No    Level of care: Med-Surg  Consultants:  Orthopedic surgery  Procedures:  none  Microbiology  Blood culture 7/6-no growth to date  Antimicrobials: Ceftriaxone 7/6   Objective: Vitals:  07/29/20 2016 07/30/20 0035 07/30/20 0408 07/30/20 0731  BP: 140/78 134/77 122/74 125/76  Pulse: 90 97 92 89  Resp: 14 15 15 16   Temp: 98.3 F (36.8 C) 98.2 F (36.8 C) (!) 97.4 F (36.3 C) 98.1 F (36.7 C)  TempSrc: Oral Oral Oral Oral  SpO2: 95% 96% 99% 98%  Weight:      Height:        Intake/Output Summary (Last 24 hours)  at 07/30/2020 1119 Last data filed at 07/30/2020 0545 Gross per 24 hour  Intake 525 ml  Output 400 ml  Net 125 ml    Filed Weights   07/28/20 0150  Weight: 62.9 kg    Examination:  Constitutional: NAD Eyes: No icterus ENMT: mmm Neck: normal, supple Respiratory: Clear bilaterally, no wheezing or crackles Cardiovascular: rrr, no mrg Abdomen: Nondistended, bowel sounds positive Musculoskeletal: no clubbing / cyanosis.  Skin: no new rashes, small piece of metal protruding from the right knee Neurologic: Nonfocal  Data Reviewed: I have independently reviewed following labs and imaging studies   CBC: Recent Labs  Lab 07/24/20 0409 07/25/20 0253 07/26/20 1223 07/27/20 1914 07/28/20 0658 07/29/20 0116 07/30/20 0101  WBC 9.9 7.6 6.9 8.3 7.3 7.3 8.5  NEUTROABS 7.4 4.6 4.4 5.6  --   --   --   HGB 9.8* 10.0* 10.0* 10.7* 10.6* 9.5* 10.8*  HCT 30.7* 31.0* 31.2* 33.8* 33.1* 29.7* 33.1*  MCV 89.5 89.1 94.3 93.1 90.7 90.8 90.9  PLT 222 220 248 263 259 265 588    Basic Metabolic Panel: Recent Labs  Lab 07/24/20 0409 07/25/20 0253 07/26/20 1223 07/27/20 1914 07/27/20 2149 07/28/20 0658 07/29/20 0116 07/30/20 0101  NA 131*  --  133* 134*  --  134* 134* 134*  K 3.6  --  4.1 4.1  --  4.0 4.4 4.5  CL 99  --  100 99  --  101 101 102  CO2 23  --  26 27  --  28 27 28   GLUCOSE 107*  --  106* 106*  --  97 101* 103*  BUN 19  --  11 9  --  8 6* 8  CREATININE 0.60  --  0.54 0.57 0.52 0.57 0.66 0.55  CALCIUM 8.3*  --  8.3* 8.6*  --  8.6* 8.3* 8.6*  MG 1.9 1.8  --   --   --  1.9  --   --   PHOS  --   --   --   --   --  3.3  --   --     Liver Function Tests: Recent Labs  Lab 07/26/20 1223 07/28/20 0658  AST 18 11*  ALT 19 15  ALKPHOS 77 80  BILITOT 0.8 0.6  PROT 5.7* 6.0*  ALBUMIN 2.5* 2.6*    Coagulation Profile: Recent Labs  Lab 07/28/20 0658  INR 1.1    HbA1C: No results for input(s): HGBA1C in the last 72 hours. CBG: Recent Labs  Lab 07/24/20 1224  GLUCAP  97     Recent Results (from the past 240 hour(s))  Urine Culture     Status: Abnormal   Collection Time: 07/22/20  2:42 PM   Specimen: Urine, Clean Catch  Result Value Ref Range Status   Specimen Description URINE, CLEAN CATCH  Final   Special Requests   Final    NONE Performed at [facility], [place] [address], [place], [place] [address]    Culture 80,000 COLONIES/mL ESCHERICHIA COLI (A)  Final   Report Status 07/24/2020 FINAL  Final   Organism ID, Bacteria ESCHERICHIA COLI (A)  Final      Susceptibility   Escherichia coli - MIC*    AMPICILLIN <=2 SENSITIVE Sensitive     CEFAZOLIN <=4 SENSITIVE Sensitive     CEFEPIME <=0.12 SENSITIVE Sensitive     CEFTRIAXONE <=0.25 SENSITIVE Sensitive     CIPROFLOXACIN <=0.25 SENSITIVE Sensitive     GENTAMICIN <=1 SENSITIVE Sensitive     IMIPENEM <=0.25 SENSITIVE Sensitive     NITROFURANTOIN <=16 SENSITIVE Sensitive     TRIMETH/SULFA <=20 SENSITIVE Sensitive     AMPICILLIN/SULBACTAM <=2 SENSITIVE Sensitive     PIP/TAZO <=4 SENSITIVE Sensitive     * 80,000 COLONIES/mL ESCHERICHIA COLI  SARS CORONAVIRUS 2 (TAT 6-24 HRS) Nasopharyngeal Nasopharyngeal Swab     Status: None   Collection Time: 07/22/20  4:03 PM   Specimen: Nasopharyngeal Swab  Result Value Ref Range Status   SARS Coronavirus 2 NEGATIVE NEGATIVE Final    Comment: (NOTE) SARS-CoV-2 target nucleic acids are NOT DETECTED.  The SARS-CoV-2 RNA is generally detectable in upper and lower respiratory specimens during the acute phase of infection. Negative results do not preclude SARS-CoV-2 infection, do not rule out co-infections with other pathogens, and should not be used as the sole basis for treatment or other patient management decisions. Negative results must be combined with clinical observations, patient history, and epidemiological information. The expected result is Negative.  Fact Sheet for Patients: SugarRoll.be  Fact Sheet for  Healthcare Providers: https://www.woods-mathews.com/  This test is not yet approved or cleared by the [place] FDA and  has been authorized for detection and/or diagnosis of SARS-CoV-2 by FDA under an Emergency Use Authorization (EUA). This EUA will remain  in effect (meaning this test can be used) for the duration of the COVID-19 declaration under Se ction 564(b)(1) of the Act, 21 U.S.C. section 360bbb-3(b)(1), unless the authorization is terminated or revoked sooner.  Performed at [facility], [place] [address] [place], [place] [address]   Resp Panel by RT-PCR (Flu A&B, Covid) Nasopharyngeal Swab     Status: None   Collection Time: 07/25/20 11:22 AM   Specimen: Nasopharyngeal Swab; Nasopharyngeal(NP) swabs in vial transport medium  Result Value Ref Range Status   SARS Coronavirus 2 by RT PCR NEGATIVE NEGATIVE Final    Comment: (NOTE) SARS-CoV-2 target nucleic acids are NOT DETECTED.  The SARS-CoV-2 RNA is generally detectable in upper respiratory specimens during the acute phase of infection. The lowest concentration of SARS-CoV-2 viral copies this assay can detect is 138 copies/mL. A negative result does not preclude SARS-Cov-2 infection and should not be used as the sole basis for treatment or other patient management decisions. A negative result may occur with  improper specimen collection/handling, submission of specimen other than nasopharyngeal swab, presence of viral mutation(s) within the areas targeted by this assay, and inadequate number of viral copies(<138 copies/mL). A negative result must be combined with clinical observations, patient history, and epidemiological information. The expected result is Negative.  Fact Sheet for Patients:  EntrepreneurPulse.com.au  Fact Sheet for Healthcare Providers:  IncredibleEmployment.be  This test is no t yet approved or cleared by the [place] FDA and  has been  authorized for detection and/or diagnosis of SARS-CoV-2 by FDA under an Emergency Use Authorization (EUA). This EUA will remain  in effect (meaning this test can be used) for the duration of the COVID-19 declaration under Section 564(b)(1) of the Act, 21  U.S.C.section 360bbb-3(b)(1), unless the authorization is terminated  or revoked sooner.       Influenza A by PCR NEGATIVE NEGATIVE Final   Influenza B by PCR NEGATIVE NEGATIVE Final    Comment: (NOTE) The Xpert Xpress SARS-CoV-2/FLU/RSV plus assay is intended as an aid in the diagnosis of influenza from Nasopharyngeal swab specimens and should not be used as a sole basis for treatment. Nasal washings and aspirates are unacceptable for Xpert Xpress SARS-CoV-2/FLU/RSV testing.  Fact Sheet for Patients: EntrepreneurPulse.com.au  Fact Sheet for Healthcare Providers: IncredibleEmployment.be  This test is not yet approved or cleared by the [place] FDA and has been authorized for detection and/or diagnosis of SARS-CoV-2 by FDA under an Emergency Use Authorization (EUA). This EUA will remain in effect (meaning this test can be used) for the duration of the COVID-19 declaration under Section 564(b)(1) of the Act, 21 U.S.C. section 360bbb-3(b)(1), unless the authorization is terminated or revoked.  Performed at [facility], [place] [address] [place], [place] [address]   Urine culture     Status: Abnormal   Collection Time: 07/26/20  1:44 PM   Specimen: Urine, Clean Catch  Result Value Ref Range Status   Specimen Description URINE, CLEAN CATCH  Final   Special Requests   Final    NONE Performed at [facility], [place] [address] [place], [place] [address]    Culture MULTIPLE SPECIES PRESENT, SUGGEST RECOLLECTION (A)  Final   Report Status 07/27/2020 FINAL  Final  Resp Panel by RT-PCR (Flu A&B, Covid) Nasopharyngeal Swab     Status: None   Collection Time: 07/27/20  7:36 PM    Specimen: Nasopharyngeal Swab; Nasopharyngeal(NP) swabs in vial transport medium  Result Value Ref Range Status   SARS Coronavirus 2 by RT PCR NEGATIVE NEGATIVE Final    Comment: (NOTE) SARS-CoV-2 target nucleic acids are NOT DETECTED.  The SARS-CoV-2 RNA is generally detectable in upper respiratory specimens during the acute phase of infection. The lowest concentration of SARS-CoV-2 viral copies this assay can detect is 138 copies/mL. A negative result does not preclude SARS-Cov-2 infection and should not be used as the sole basis for treatment or other patient management decisions. A negative result may occur with  improper specimen collection/handling, submission of specimen other than nasopharyngeal swab, presence of viral mutation(s) within the areas targeted by this assay, and inadequate number of viral copies(<138 copies/mL). A negative result must be combined with clinical observations, patient history, and epidemiological information. The expected result is Negative.  Fact Sheet for Patients:  EntrepreneurPulse.com.au  Fact Sheet for Healthcare Providers:  IncredibleEmployment.be  This test is no t yet approved or cleared by the [place] FDA and  has been authorized for detection and/or diagnosis of SARS-CoV-2 by FDA under an Emergency Use Authorization (EUA). This EUA will remain  in effect (meaning this test can be used) for the duration of the COVID-19 declaration under Section 564(b)(1) of the Act, 21 U.S.C.section 360bbb-3(b)(1), unless the authorization is terminated  or revoked sooner.       Influenza A by PCR NEGATIVE NEGATIVE Final   Influenza B by PCR NEGATIVE NEGATIVE Final    Comment: (NOTE) The Xpert Xpress SARS-CoV-2/FLU/RSV plus assay is intended as an aid in the diagnosis of influenza from Nasopharyngeal swab specimens and should not be used as a sole basis for treatment. Nasal washings and aspirates are  unacceptable for Xpert Xpress SARS-CoV-2/FLU/RSV testing.  Fact Sheet for Patients: EntrepreneurPulse.com.au  Fact Sheet for Healthcare  Providers: IncredibleEmployment.be  This test is not yet approved or cleared by the [place] and has been authorized for detection and/or diagnosis of SARS-CoV-2 by FDA under an Emergency Use Authorization (EUA). This EUA will remain in effect (meaning this test can be used) for the duration of the COVID-19 declaration under Section 564(b)(1) of the Act, 21 U.S.C. section 360bbb-3(b)(1), unless the authorization is terminated or revoked.  Performed at [facility], [place] [address] [place], [place] [address]   Culture, blood (routine x 2)     Status: None (Preliminary result)   Collection Time: 07/27/20  7:58 PM   Specimen: BLOOD  Result Value Ref Range Status   Specimen Description BLOOD RIGHT ANTECUBITAL  Final   Special Requests   Final    BOTTLES DRAWN AEROBIC AND ANAEROBIC Blood Culture results may not be optimal due to an inadequate volume of blood received in culture bottles   Culture   Final    NO GROWTH 3 DAYS Performed at [facility], [place] [address], [place], [place] [address]    Report Status PENDING  Incomplete  Culture, blood (routine x 2)     Status: None (Preliminary result)   Collection Time: 07/27/20  8:42 PM   Specimen: BLOOD  Result Value Ref Range Status   Specimen Description BLOOD SITE NOT SPECIFIED  Final   Special Requests   Final    BOTTLES DRAWN AEROBIC AND ANAEROBIC Blood Culture results may not be optimal due to an inadequate volume of blood received in culture bottles   Culture   Final    NO GROWTH 3 DAYS Performed at [facility], [place] [address], [place], [place] [address]    Report Status PENDING  Incomplete      Radiology Studies: No results found.  Marzetta Board, MD, PhD [facility]  Between 7 am - 7 pm I am available, please  contact me via Amion (for emergencies) or Securechat (non urgent messages)  Between 7 pm - 7 am I am not available, please contact night coverage MD/APP via Amion

## 2020-07-31 DIAGNOSIS — T847XXA Infection and inflammatory reaction due to other internal orthopedic prosthetic devices, implants and grafts, initial encounter: Secondary | ICD-10-CM | POA: Diagnosis not present

## 2020-07-31 DIAGNOSIS — L03115 Cellulitis of right lower limb: Secondary | ICD-10-CM | POA: Diagnosis not present

## 2020-07-31 MED ORDER — METOPROLOL SUCCINATE ER 25 MG PO TB24
25.0000 mg | ORAL_TABLET | Freq: Every day | ORAL | Status: DC
  Administered 2020-07-31 – 2020-08-04 (×5): 25 mg via ORAL
  Filled 2020-07-31 (×4): qty 1

## 2020-07-31 NOTE — Progress Notes (Addendum)
CCMD called stated that Pt had 15bts Vtach. Pt is alert and verbal. Asymptomatic, denies any chestpain/armpain/nausea. VSS BP 108/72 PR90 RR18 T97.6 94% room air. Paged Scarlette Shorts Blount,NP  Also, noted that PIV is out (2nd time that pt. removed her PIV this shift). Successful attempt to insert another PIV to L hand. Reoriented/advised the use of her PIV, verbalized understanding at this time.   Call bell within reach and will continue to monitor.

## 2020-07-31 NOTE — Progress Notes (Signed)
PROGRESS NOTE  Stephanie Donovan YQI:[id number] DOB: 1927-04-19 DOA: 07/27/2020 PCP: System, Provider Not In   LOS: 4 days   Brief Narrative / Interim history: 87 year old female with history of dementia, hypothyroidism, HTN, who was recently hospitalized and discharged on 7/4 after being admitted for syncope and E. coli UTI comes in with an abnormality of the right distal femur associated with hardware failure.  She has a history of knee replacement, at baseline she is nonambulatory and uses a wheelchair.  It was noted that there was some metal protruding from her knee at the SNF as well as purulent discharge.  X-ray in the ED revealed worsening of the surgical hardware failure with at least 2 cm laceration migration of plate and screw fixation along the distal femur.  Orthopedic was consulted and she was admitted to the hospital  Subjective / 24h Interval events: No complaints this morning, no chest pain, no shortness of breath  Assessment & Plan: Principal Problem Right knee cellulitis, concern for hardware infection, POA, history of right distal femur fracture/dislocation-orthopedic surgery consulted.  There were initially plans in place for patient to get AKA by Dr. Sharol Given on 7/8 however patient does not want surgery despite the daughter's initial wishes for surgery.  Given patient's refusal surgery has been canceled.  Daughter is now on the same page as her mother. -PT recommends SNF, placement pending  Active Problems Recent E. coli UTI-currently on Rocephin for cellulitis.  Continue up until discharge, then will transition to doxycycline for a month as per orthopedic surgery  NSVT-keep mag and potassium normal.  Increased beta-blocker today  Mild euvolemic hyponatremia-overall stable  Hypothyroidism -continue Synthroid   Essential hypertension-increase metoprolol, pressure stable   Vitamin B12 deficiency -most recent B12 3338, successfully repleted as an outpatient   Ambulatory  dysfunction -She is wheelchair-bound  Mild normocytic anemia-in the setting of chronic infection.  Hemoglobin stable today  Goals of care-now DNR.  Palliative following.  Scheduled Meds:  brimonidine  1 drop Left Eye BID   brinzolamide  1 drop Left Eye BID   enoxaparin (LOVENOX) injection  40 mg Subcutaneous Q24H   ferrous sulfate  325 mg Oral BID WC   levothyroxine  50 mcg Oral QAC breakfast   metoprolol succinate  25 mg Oral Daily   multivitamin with minerals  1 tablet Oral Daily   timolol  1 drop Both Eyes BID   Continuous Infusions:  cefTRIAXone (ROCEPHIN)  IV 2 g (07/30/20 2058)   PRN Meds:.acetaminophen, melatonin, morphine injection, ondansetron (ZOFRAN) IV, oxyCODONE, polyethylene glycol  Diet Orders (From admission, onward)     Start     Ordered   07/29/20 0851  Diet regular Room service appropriate? Yes; Fluid consistency: Thin  Diet effective now       Question Answer Comment  Room service appropriate? Yes   Fluid consistency: Thin      07/29/20 0850            DVT prophylaxis: enoxaparin (LOVENOX) injection 40 mg Start: 07/28/20 0800 SCDs Start: 07/27/20 2116     Code Status: DNR  Family Communication: no family at bedside, discussed with daughter over the phone  Status is: Inpatient  Remains inpatient appropriate because:Inpatient level of care appropriate due to severity of illness  Dispo:  Patient From: Home  Planned Disposition: Home with Health Care Svc  Medically stable for discharge: No    Level of care: Med-Surg  Consultants:  Orthopedic surgery  Procedures:  none  Microbiology  Blood  culture 7/6-no growth to date  Antimicrobials: Ceftriaxone 7/6   Objective: Vitals:   07/30/20 2338 07/31/20 0146 07/31/20 0427 07/31/20 0821  BP: 123/76 108/72 135/80 137/84  Pulse: 87 90 96 (!) 102  Resp: 19 18  16   Temp: 98 F (36.7 C) 97.6 F (36.4 C) 97.6 F (36.4 C) 97.7 F (36.5 C)  TempSrc: Oral Oral Oral Axillary  SpO2: 97% 94%  97% 95%  Weight:      Height:        Intake/Output Summary (Last 24 hours) at 07/31/2020 1144 Last data filed at 07/31/2020 0900 Gross per 24 hour  Intake 580 ml  Output 1170 ml  Net -590 ml    Filed Weights   07/28/20 0150  Weight: 62.9 kg    Examination:  Constitutional: NAD Eyes: No scleral icterus ENMT: mmm Neck: normal, supple Respiratory: Clear bilaterally, no wheezing, no crackles Cardiovascular: Regular rate and rhythm, no murmurs Abdomen: Soft, NT, ND, bowel sounds positive Musculoskeletal: no clubbing / cyanosis.  Skin: No new rashes, small piece of metal protruding from the right knee Neurologic: No focal deficits  Data Reviewed: I have independently reviewed following labs and imaging studies   CBC: Recent Labs  Lab 07/25/20 0253 07/26/20 1223 07/27/20 1914 07/28/20 0658 07/29/20 0116 07/30/20 0101  WBC 7.6 6.9 8.3 7.3 7.3 8.5  NEUTROABS 4.6 4.4 5.6  --   --   --   HGB 10.0* 10.0* 10.7* 10.6* 9.5* 10.8*  HCT 31.0* 31.2* 33.8* 33.1* 29.7* 33.1*  MCV 89.1 94.3 93.1 90.7 90.8 90.9  PLT 220 248 263 259 265 626    Basic Metabolic Panel: Recent Labs  Lab 07/25/20 0253 07/26/20 1223 07/26/20 1223 07/27/20 1914 07/27/20 2149 07/28/20 0658 07/29/20 0116 07/30/20 0101  NA  --  133*  --  134*  --  134* 134* 134*  K  --  4.1  --  4.1  --  4.0 4.4 4.5  CL  --  100  --  99  --  101 101 102  CO2  --  26  --  27  --  28 27 28   GLUCOSE  --  106*  --  106*  --  97 101* 103*  BUN  --  11  --  9  --  8 6* 8  CREATININE  --  0.54   < > 0.57 0.52 0.57 0.66 0.55  CALCIUM  --  8.3*  --  8.6*  --  8.6* 8.3* 8.6*  MG 1.8  --   --   --   --  1.9  --  2.0  PHOS  --   --   --   --   --  3.3  --   --    < > = values in this interval not displayed.    Liver Function Tests: Recent Labs  Lab 07/26/20 1223 07/28/20 0658  AST 18 11*  ALT 19 15  ALKPHOS 77 80  BILITOT 0.8 0.6  PROT 5.7* 6.0*  ALBUMIN 2.5* 2.6*    Coagulation Profile: Recent Labs  Lab  07/28/20 0658  INR 1.1    HbA1C: No results for input(s): HGBA1C in the last 72 hours. CBG: Recent Labs  Lab 07/24/20 1224  GLUCAP 97     Recent Results (from the past 240 hour(s))  Urine Culture     Status: Abnormal   Collection Time: 07/22/20  2:42 PM   Specimen: Urine, Clean Catch  Result Value Ref Range  Status   Specimen Description URINE, CLEAN CATCH  Final   Special Requests   Final    NONE Performed at [facility], [place] [address] [place], [place] [address]    Culture 80,000 COLONIES/mL ESCHERICHIA COLI (A)  Final   Report Status 07/24/2020 FINAL  Final   Organism ID, Bacteria ESCHERICHIA COLI (A)  Final      Susceptibility   Escherichia coli - MIC*    AMPICILLIN <=2 SENSITIVE Sensitive     CEFAZOLIN <=4 SENSITIVE Sensitive     CEFEPIME <=0.12 SENSITIVE Sensitive     CEFTRIAXONE <=0.25 SENSITIVE Sensitive     CIPROFLOXACIN <=0.25 SENSITIVE Sensitive     GENTAMICIN <=1 SENSITIVE Sensitive     IMIPENEM <=0.25 SENSITIVE Sensitive     NITROFURANTOIN <=16 SENSITIVE Sensitive     TRIMETH/SULFA <=20 SENSITIVE Sensitive     AMPICILLIN/SULBACTAM <=2 SENSITIVE Sensitive     PIP/TAZO <=4 SENSITIVE Sensitive     * 80,000 COLONIES/mL ESCHERICHIA COLI  SARS CORONAVIRUS 2 (TAT 6-24 HRS) Nasopharyngeal Nasopharyngeal Swab     Status: None   Collection Time: 07/22/20  4:03 PM   Specimen: Nasopharyngeal Swab  Result Value Ref Range Status   SARS Coronavirus 2 NEGATIVE NEGATIVE Final    Comment: (NOTE) SARS-CoV-2 target nucleic acids are NOT DETECTED.  The SARS-CoV-2 RNA is generally detectable in upper and lower respiratory specimens during the acute phase of infection. Negative results do not preclude SARS-CoV-2 infection, do not rule out co-infections with other pathogens, and should not be used as the sole basis for treatment or other patient management decisions. Negative results must be combined with clinical observations, patient history, and epidemiological  information. The expected result is Negative.  Fact Sheet for Patients: SugarRoll.be  Fact Sheet for Healthcare Providers: https://www.woods-mathews.com/  This test is not yet approved or cleared by the [place] FDA and  has been authorized for detection and/or diagnosis of SARS-CoV-2 by FDA under an Emergency Use Authorization (EUA). This EUA will remain  in effect (meaning this test can be used) for the duration of the COVID-19 declaration under Se ction 564(b)(1) of the Act, 21 U.S.C. section 360bbb-3(b)(1), unless the authorization is terminated or revoked sooner.  Performed at [facility], [place] [address], [place], [place] [address]   Resp Panel by RT-PCR (Flu A&B, Covid) Nasopharyngeal Swab     Status: None   Collection Time: 07/25/20 11:22 AM   Specimen: Nasopharyngeal Swab; Nasopharyngeal(NP) swabs in vial transport medium  Result Value Ref Range Status   SARS Coronavirus 2 by RT PCR NEGATIVE NEGATIVE Final    Comment: (NOTE) SARS-CoV-2 target nucleic acids are NOT DETECTED.  The SARS-CoV-2 RNA is generally detectable in upper respiratory specimens during the acute phase of infection. The lowest concentration of SARS-CoV-2 viral copies this assay can detect is 138 copies/mL. A negative result does not preclude SARS-Cov-2 infection and should not be used as the sole basis for treatment or other patient management decisions. A negative result may occur with  improper specimen collection/handling, submission of specimen other than nasopharyngeal swab, presence of viral mutation(s) within the areas targeted by this assay, and inadequate number of viral copies(<138 copies/mL). A negative result must be combined with clinical observations, patient history, and epidemiological information. The expected result is Negative.  Fact Sheet for Patients:  EntrepreneurPulse.com.au  Fact Sheet for Healthcare  Providers:  IncredibleEmployment.be  This test is no t yet approved or cleared by the [place] and  has been authorized for detection and/or diagnosis of SARS-CoV-2 by FDA under an Emergency Use Authorization (EUA). This EUA will remain  in effect (meaning this test can be used) for the duration of the COVID-19 declaration under Section 564(b)(1) of the Act, 21 U.S.C.section 360bbb-3(b)(1), unless the authorization is terminated  or revoked sooner.       Influenza A by PCR NEGATIVE NEGATIVE Final   Influenza B by PCR NEGATIVE NEGATIVE Final    Comment: (NOTE) The Xpert Xpress SARS-CoV-2/FLU/RSV plus assay is intended as an aid in the diagnosis of influenza from Nasopharyngeal swab specimens and should not be used as a sole basis for treatment. Nasal washings and aspirates are unacceptable for Xpert Xpress SARS-CoV-2/FLU/RSV testing.  Fact Sheet for Patients: EntrepreneurPulse.com.au  Fact Sheet for Healthcare Providers: IncredibleEmployment.be  This test is not yet approved or cleared by the [place] FDA and has been authorized for detection and/or diagnosis of SARS-CoV-2 by FDA under an Emergency Use Authorization (EUA). This EUA will remain in effect (meaning this test can be used) for the duration of the COVID-19 declaration under Section 564(b)(1) of the Act, 21 U.S.C. section 360bbb-3(b)(1), unless the authorization is terminated or revoked.  Performed at [facility], [place] [address] [place], [place] [address]   Urine culture     Status: Abnormal   Collection Time: 07/26/20  1:44 PM   Specimen: Urine, Clean Catch  Result Value Ref Range Status   Specimen Description URINE, CLEAN CATCH  Final   Special Requests   Final    NONE Performed at [facility], [place] [address], [place],  [address]    Culture MULTIPLE SPECIES PRESENT, SUGGEST RECOLLECTION (A)  Final   Report Status  07/27/2020 FINAL  Final  Resp Panel by RT-PCR (Flu A&B, Covid) Nasopharyngeal Swab     Status: None   Collection Time: 07/27/20  7:36 PM   Specimen: Nasopharyngeal Swab; Nasopharyngeal(NP) swabs in vial transport medium  Result Value Ref Range Status   SARS Coronavirus 2 by RT PCR NEGATIVE NEGATIVE Final    Comment: (NOTE) SARS-CoV-2 target nucleic acids are NOT DETECTED.  The SARS-CoV-2 RNA is generally detectable in upper respiratory specimens during the acute phase of infection. The lowest concentration of SARS-CoV-2 viral copies this assay can detect is 138 copies/mL. A negative result does not preclude SARS-Cov-2 infection and should not be used as the sole basis for treatment or other patient management decisions. A negative result may occur with  improper specimen collection/handling, submission of specimen other than nasopharyngeal swab, presence of viral mutation(s) within the areas targeted by this assay, and inadequate number of viral copies(<138 copies/mL). A negative result must be combined with clinical observations, patient history, and epidemiological information. The expected result is Negative.  Fact Sheet for Patients:  EntrepreneurPulse.com.au  Fact Sheet for Healthcare Providers:  IncredibleEmployment.be  This test is no t yet approved or cleared by the [place] FDA and  has been authorized for detection and/or diagnosis of SARS-CoV-2 by FDA under an Emergency Use Authorization (EUA). This EUA will remain  in effect (meaning this test can be used) for the duration of the COVID-19 declaration under Section 564(b)(1) of the Act, 21 U.S.C.section 360bbb-3(b)(1), unless the authorization is terminated  or revoked sooner.       Influenza A by PCR NEGATIVE NEGATIVE Final   Influenza B by PCR NEGATIVE NEGATIVE Final    Comment: (NOTE) The Xpert Xpress SARS-CoV-2/FLU/RSV plus assay is intended as an  aid in the diagnosis of  influenza from Nasopharyngeal swab specimens and should not be used as a sole basis for treatment. Nasal washings and aspirates are unacceptable for Xpert Xpress SARS-CoV-2/FLU/RSV testing.  Fact Sheet for Patients: EntrepreneurPulse.com.au  Fact Sheet for Healthcare Providers: IncredibleEmployment.be  This test is not yet approved or cleared by the [place] FDA and has been authorized for detection and/or diagnosis of SARS-CoV-2 by FDA under an Emergency Use Authorization (EUA). This EUA will remain in effect (meaning this test can be used) for the duration of the COVID-19 declaration under Section 564(b)(1) of the Act, 21 U.S.C. section 360bbb-3(b)(1), unless the authorization is terminated or revoked.  Performed at [facility], [place] [address], [place], [place] [address]   Culture, blood (routine x 2)     Status: None (Preliminary result)   Collection Time: 07/27/20  7:58 PM   Specimen: BLOOD  Result Value Ref Range Status   Specimen Description BLOOD RIGHT ANTECUBITAL  Final   Special Requests   Final    BOTTLES DRAWN AEROBIC AND ANAEROBIC Blood Culture results may not be optimal due to an inadequate volume of blood received in culture bottles   Culture   Final    NO GROWTH 4 DAYS Performed at [facility], [place] [address], [place], [place] [address]    Report Status PENDING  Incomplete  Culture, blood (routine x 2)     Status: None (Preliminary result)   Collection Time: 07/27/20  8:42 PM   Specimen: BLOOD  Result Value Ref Range Status   Specimen Description BLOOD SITE NOT SPECIFIED  Final   Special Requests   Final    BOTTLES DRAWN AEROBIC AND ANAEROBIC Blood Culture results may not be optimal due to an inadequate volume of blood received in culture bottles   Culture   Final    NO GROWTH 4 DAYS Performed at [facility], [place] [address] [place], [place] [address]    Report Status PENDING  Incomplete       Radiology Studies: No results found.  Marzetta Board, MD, PhD [facility]  Between 7 am - 7 pm I am available, please contact me via Amion (for emergencies) or Securechat (non urgent messages)  Between 7 pm - 7 am I am not available, please contact night coverage MD/APP via Amion

## 2020-08-01 DIAGNOSIS — T847XXA Infection and inflammatory reaction due to other internal orthopedic prosthetic devices, implants and grafts, initial encounter: Secondary | ICD-10-CM | POA: Diagnosis not present

## 2020-08-01 DIAGNOSIS — L03115 Cellulitis of right lower limb: Secondary | ICD-10-CM | POA: Diagnosis not present

## 2020-08-01 LAB — TYPE AND SCREEN
ABO/RH(D): O POS
Antibody Screen: POSITIVE
Donor AG Type: NEGATIVE
Donor AG Type: NEGATIVE
Unit division: 0
Unit division: 0

## 2020-08-01 LAB — BPAM RBC
Blood Product Expiration Date: 202208112359
Blood Product Expiration Date: 202208112359
Unit Type and Rh: 5100
Unit Type and Rh: 5100

## 2020-08-01 LAB — CULTURE, BLOOD (ROUTINE X 2)
Culture: NO GROWTH
Culture: NO GROWTH

## 2020-08-01 NOTE — Progress Notes (Signed)
Physical Therapy Treatment Patient Details Name: Stephanie Donovan MRN: [id number] DOB: 28-Dec-1927 Today's Date: 08/01/2020    History of Present Illness 87 year old female with history of dementia, hypothyroidism, HTN, who was recently hospitalized and discharged on 7/4 after being admitted for syncope and E. coli UTI comes in with an abnormality of the right distal femur associated with hardware failure.  She has a history of knee replacement, at baseline she is nonambulatory and uses a wheelchair.  It was noted that there was some metal protruding from her knee at the SNF as well as purulent discharge.  X-ray in the ED revealed worsening of the surgical hardware failure with at least 2 cm laceration migration of plate and screw fixation along the distal femur.  Orthopedic was consulted and she was admitted to the hospital    PT Comments    Required constant redirection.  Focused on LLE and B UE strengthening due to presentation of R knee.  Continue to recommend return to snf.  Pt fatigued post exercises.      Follow Up Recommendations  SNF;Supervision for mobility/OOB     Equipment Recommendations  None recommended by PT    Recommendations for Other Services       Precautions / Restrictions Precautions Precautions: Fall Restrictions Weight Bearing Restrictions: No    Mobility  Bed Mobility Overal bed mobility: Needs Assistance             General bed mobility comments: assistance to flex L knee to push through her foot to scoot to [facility].  bed tilted for gravity assistance.    Transfers                 General transfer comment: did not perform.  Ambulation/Gait                 Stairs             Wheelchair Mobility    Modified Rankin (Stroke Patients Only)       Balance                                            Cognition Arousal/Alertness: Awake/alert Behavior During Therapy: WFL for tasks  assessed/performed Overall Cognitive Status: History of cognitive impairments - at baseline                                 General Comments: patient states "what am I doing here?"      Exercises General Exercises - Lower Extremity Ankle Circles/Pumps: AROM;Left;15 reps;Supine Quad Sets: AROM;Left;10 reps;Supine Heel Slides: AROM;Left;10 reps;Supine Hip ABduction/ADduction: AROM;Left;10 reps;Supine Other Exercises Other Exercises: B shoulder flexion/ext x 10 reps supine ( HOB elevated ) Other Exercises: B elbow flexion and extension in supine ( HOB elevated)  x 10 reps. Other Exercises: B shoulder elevation x 10 reps supine ( HOB elevated )    General Comments        Pertinent Vitals/Pain Pain Assessment: Faces Faces Pain Scale: Hurts even more Pain Location: R knee with movement, pt grimaced when scooting up to edge of bed. Pain Descriptors / Indicators: Discomfort;Grimacing;Guarding;Sore Pain Intervention(s): Monitored during session;Repositioned    Home Living                      Prior Function  PT Goals (current goals can now be found in the care plan section) Acute Rehab PT Goals Potential to Achieve Goals: Good Progress towards PT goals: Progressing toward goals    Frequency    Min 2X/week      PT Plan Current plan remains appropriate    Co-evaluation              AM-PAC PT "6 Clicks" Mobility   Outcome Measure  Help needed turning from your back to your side while in a flat bed without using bedrails?: A Little Help needed moving from lying on your back to sitting on the side of a flat bed without using bedrails?: A Little Help needed moving to and from a bed to a chair (including a wheelchair)?: A Lot Help needed standing up from a chair using your arms (e.g., wheelchair or bedside chair)?: A Lot Help needed to walk in hospital room?: Total Help needed climbing 3-5 steps with a railing? : Total 6 Click Score:  12    End of Session   Activity Tolerance: Patient limited by pain Patient left: in bed;with call bell/phone within reach;with bed alarm set;with SCD's reapplied Nurse Communication: Mobility status PT Visit Diagnosis: Muscle weakness (generalized) (M62.81);Pain;Other abnormalities of gait and mobility (R26.89) Pain - Right/Left: Right Pain - part of body: Knee     Time: 0272-5366 PT Time Calculation (min) (ACUTE ONLY): 11 min  Charges:  $Therapeutic Exercise: 8-22 mins                     Stephanie Donovan , PTA Acute Rehabilitation Services Pager [phone] Office [phone]    Stephanie Donovan [name] 08/01/2020, 5:49 PM

## 2020-08-01 NOTE — TOC Progression Note (Signed)
Transition of Care [facility]) - Progression Note    Patient Details  Name: Stephanie Donovan MRN: [id number] Date of Birth: February 21, 1927  [facility] CM/SW [place], [place] Phone Number: 08/01/2020, 4:57 PM  Clinical Narrative:     CSW gave bed offers to pts daughter. Pts daughter [place] chose [facility]. CSW called [facility] to inform them of pts choice. CSW left message to call back. CSW requested covid test from MD.  Expected Discharge Plan: [place] Barriers to Discharge: SNF Pending bed offer, Continued Medical Work up  Expected Discharge Plan and Services Expected Discharge Plan: [place] Choice: [place] arrangements for the past 2 months: Assisted Living Facility                                       Social Determinants of Health (SDOH) Interventions    Readmission Risk Interventions No flowsheet data found.  Emeterio Reeve, Latanya Presser, [place] Social Worker [phone]

## 2020-08-01 NOTE — Progress Notes (Signed)
PROGRESS NOTE  Stephanie Donovan AST:[id number] DOB: December 17, 1927 DOA: 07/27/2020 PCP: System, Provider Not In   LOS: 5 days   Brief Narrative / Interim history: 87 year old female with history of dementia, hypothyroidism, HTN, who was recently hospitalized and discharged on 7/4 after being admitted for syncope and E. coli UTI comes in with an abnormality of the right distal femur associated with hardware failure.  She has a history of knee replacement, at baseline she is nonambulatory and uses a wheelchair.  It was noted that there was some metal protruding from her knee at the SNF as well as purulent discharge.  X-ray in the ED revealed worsening of the surgical hardware failure with at least 2 cm laceration migration of plate and screw fixation along the distal femur.  Orthopedic was consulted and she was admitted to the hospital  Subjective / 24h Interval events: No complaints, no chest pain, no shortness of breath  Assessment & Plan: Principal Problem Right knee cellulitis, concern for hardware infection, POA, history of right distal femur fracture/dislocation-orthopedic surgery consulted.  There were initially plans in place for patient to get AKA by Dr. Sharol Given on 7/8 however patient does not want surgery despite the daughter's initial wishes for surgery.  Given patient's refusal surgery has been canceled.  Daughter is now on the same page as her mother. -PT recommends SNF, placement pending, discussed with SW, family to choose bed offers  Active Problems Recent E. coli UTI-currently on Rocephin for cellulitis.  Continue up until discharge, then will transition to doxycycline for a month as per orthopedic surgery  NSVT-keep mag and potassium normal.  Beta-blocker has been increased.  Stable.  Mild euvolemic hyponatremia-overall stable  Hypothyroidism -continue Synthroid   Essential hypertension-increase metoprolol, pressure stable   Vitamin B12 deficiency -most recent B12 3338,  successfully repleted as an outpatient   Ambulatory dysfunction -She is wheelchair-bound  Mild normocytic anemia-in the setting of chronic infection.  Hemoglobin stable today  Goals of care-now DNR.  Palliative following.  Scheduled Meds:  brimonidine  1 drop Left Eye BID   brinzolamide  1 drop Left Eye BID   enoxaparin (LOVENOX) injection  40 mg Subcutaneous Q24H   ferrous sulfate  325 mg Oral BID WC   levothyroxine  50 mcg Oral QAC breakfast   metoprolol succinate  25 mg Oral Daily   multivitamin with minerals  1 tablet Oral Daily   timolol  1 drop Both Eyes BID   Continuous Infusions:  cefTRIAXone (ROCEPHIN)  IV 2 g (07/31/20 2026)   PRN Meds:.acetaminophen, melatonin, morphine injection, ondansetron (ZOFRAN) IV, oxyCODONE, polyethylene glycol  Diet Orders (From admission, onward)     Start     Ordered   07/29/20 0851  Diet regular Room service appropriate? Yes; Fluid consistency: Thin  Diet effective now       Question Answer Comment  Room service appropriate? Yes   Fluid consistency: Thin      07/29/20 0850            DVT prophylaxis: enoxaparin (LOVENOX) injection 40 mg Start: 07/28/20 0800 SCDs Start: 07/27/20 2116     Code Status: DNR  Family Communication: no family at bedside, discussed with daughter over the phone  Status is: Inpatient  Remains inpatient appropriate because:Inpatient level of care appropriate due to severity of illness  Dispo:  Patient From: Home  Planned Disposition: Home with Health Care Svc  Medically stable for discharge: No    Level of care: Med-Surg  Consultants:  Orthopedic  surgery  Procedures:  none  Microbiology  Blood culture 7/6-no growth to date  Antimicrobials: Ceftriaxone 7/6   Objective: Vitals:   08/01/20 0358 08/01/20 0745 08/01/20 1229 08/01/20 1357  BP: 120/80 (!) 111/92 (!) 125/102 115/77  Pulse: 100 (!) 103 99 90  Resp: 17 16 17 15   Temp: 97.7 F (36.5 C) 97.9 F (36.6 C) (!) 97.5 F (36.4  C) 98.1 F (36.7 C)  TempSrc: Oral Oral Oral Oral  SpO2: 97% 97% 95% 95%  Weight:      Height:        Intake/Output Summary (Last 24 hours) at 08/01/2020 1405 Last data filed at 08/01/2020 1247 Gross per 24 hour  Intake 90 ml  Output 900 ml  Net -810 ml    Filed Weights   07/28/20 0150  Weight: 62.9 kg    Examination:  Constitutional: She is in no distress Eyes: No scleral icterus ENMT: mmm Neck: normal, supple Respiratory: Lungs are clear bilaterally, no wheezing or crackles heard Cardiovascular: Regular rate and rhythm, no murmurs Abdomen: Soft, NT, ND, bowel sounds positive Musculoskeletal: no clubbing / cyanosis.  Skin: No new rashes, small piece of metal protruding from the right knee Neurologic: Nonfocal  Data Reviewed: I have independently reviewed following labs and imaging studies   CBC: Recent Labs  Lab 07/26/20 1223 07/27/20 1914 07/28/20 0658 07/29/20 0116 07/30/20 0101  WBC 6.9 8.3 7.3 7.3 8.5  NEUTROABS 4.4 5.6  --   --   --   HGB 10.0* 10.7* 10.6* 9.5* 10.8*  HCT 31.2* 33.8* 33.1* 29.7* 33.1*  MCV 94.3 93.1 90.7 90.8 90.9  PLT 248 263 259 265 962    Basic Metabolic Panel: Recent Labs  Lab 07/26/20 1223 07/27/20 1914 07/27/20 2149 07/28/20 0658 07/29/20 0116 07/30/20 0101  NA 133* 134*  --  134* 134* 134*  K 4.1 4.1  --  4.0 4.4 4.5  CL 100 99  --  101 101 102  CO2 26 27  --  28 27 28   GLUCOSE 106* 106*  --  97 101* 103*  BUN 11 9  --  8 6* 8  CREATININE 0.54 0.57 0.52 0.57 0.66 0.55  CALCIUM 8.3* 8.6*  --  8.6* 8.3* 8.6*  MG  --   --   --  1.9  --  2.0  PHOS  --   --   --  3.3  --   --     Liver Function Tests: Recent Labs  Lab 07/26/20 1223 07/28/20 0658  AST 18 11*  ALT 19 15  ALKPHOS 77 80  BILITOT 0.8 0.6  PROT 5.7* 6.0*  ALBUMIN 2.5* 2.6*    Coagulation Profile: Recent Labs  Lab 07/28/20 0658  INR 1.1    HbA1C: No results for input(s): HGBA1C in the last 72 hours. CBG: No results for input(s): GLUCAP in  the last 168 hours.   Recent Results (from the past 240 hour(s))  Urine Culture     Status: Abnormal   Collection Time: 07/22/20  2:42 PM   Specimen: Urine, Clean Catch  Result Value Ref Range Status   Specimen Description URINE, CLEAN CATCH  Final   Special Requests   Final    NONE Performed at [facility], [address], [place], [place] [address]    Culture 80,000 COLONIES/mL ESCHERICHIA COLI (A)  Final   Report Status 07/24/2020 FINAL  Final   Organism ID, Bacteria ESCHERICHIA COLI (A)  Final  Susceptibility   Escherichia coli - MIC*    AMPICILLIN <=2 SENSITIVE Sensitive     CEFAZOLIN <=4 SENSITIVE Sensitive     CEFEPIME <=0.12 SENSITIVE Sensitive     CEFTRIAXONE <=0.25 SENSITIVE Sensitive     CIPROFLOXACIN <=0.25 SENSITIVE Sensitive     GENTAMICIN <=1 SENSITIVE Sensitive     IMIPENEM <=0.25 SENSITIVE Sensitive     NITROFURANTOIN <=16 SENSITIVE Sensitive     TRIMETH/SULFA <=20 SENSITIVE Sensitive     AMPICILLIN/SULBACTAM <=2 SENSITIVE Sensitive     PIP/TAZO <=4 SENSITIVE Sensitive     * 80,000 COLONIES/mL ESCHERICHIA COLI  SARS CORONAVIRUS 2 (TAT 6-24 HRS) Nasopharyngeal Nasopharyngeal Swab     Status: None   Collection Time: 07/22/20  4:03 PM   Specimen: Nasopharyngeal Swab  Result Value Ref Range Status   SARS Coronavirus 2 NEGATIVE NEGATIVE Final    Comment: (NOTE) SARS-CoV-2 target nucleic acids are NOT DETECTED.  The SARS-CoV-2 RNA is generally detectable in upper and lower respiratory specimens during the acute phase of infection. Negative results do not preclude SARS-CoV-2 infection, do not rule out co-infections with other pathogens, and should not be used as the sole basis for treatment or other patient management decisions. Negative results must be combined with clinical observations, patient history, and epidemiological information. The expected result is Negative.  Fact Sheet for Patients: SugarRoll.be  Fact  Sheet for Healthcare Providers: https://www.woods-mathews.com/  This test is not yet approved or cleared by the [place] FDA and  has been authorized for detection and/or diagnosis of SARS-CoV-2 by FDA under an Emergency Use Authorization (EUA). This EUA will remain  in effect (meaning this test can be used) for the duration of the COVID-19 declaration under Se ction 564(b)(1) of the Act, 21 U.S.C. section 360bbb-3(b)(1), unless the authorization is terminated or revoked sooner.  Performed at [facility], [place] [address], [place], [place] [address]   Resp Panel by RT-PCR (Flu A&B, Covid) Nasopharyngeal Swab     Status: None   Collection Time: 07/25/20 11:22 AM   Specimen: Nasopharyngeal Swab; Nasopharyngeal(NP) swabs in vial transport medium  Result Value Ref Range Status   SARS Coronavirus 2 by RT PCR NEGATIVE NEGATIVE Final    Comment: (NOTE) SARS-CoV-2 target nucleic acids are NOT DETECTED.  The SARS-CoV-2 RNA is generally detectable in upper respiratory specimens during the acute phase of infection. The lowest concentration of SARS-CoV-2 viral copies this assay can detect is 138 copies/mL. A negative result does not preclude SARS-Cov-2 infection and should not be used as the sole basis for treatment or other patient management decisions. A negative result may occur with  improper specimen collection/handling, submission of specimen other than nasopharyngeal swab, presence of viral mutation(s) within the areas targeted by this assay, and inadequate number of viral copies(<138 copies/mL). A negative result must be combined with clinical observations, patient history, and epidemiological information. The expected result is Negative.  Fact Sheet for Patients:  EntrepreneurPulse.com.au  Fact Sheet for Healthcare Providers:  IncredibleEmployment.be  This test is no t yet approved or cleared by the [place] FDA and   has been authorized for detection and/or diagnosis of SARS-CoV-2 by FDA under an Emergency Use Authorization (EUA). This EUA will remain  in effect (meaning this test can be used) for the duration of the COVID-19 declaration under Section 564(b)(1) of the Act, 21 U.S.C.section 360bbb-3(b)(1), unless the authorization is terminated  or revoked sooner.       Influenza A by PCR NEGATIVE NEGATIVE Final  Influenza B by PCR NEGATIVE NEGATIVE Final    Comment: (NOTE) The Xpert Xpress SARS-CoV-2/FLU/RSV plus assay is intended as an aid in the diagnosis of influenza from Nasopharyngeal swab specimens and should not be used as a sole basis for treatment. Nasal washings and aspirates are unacceptable for Xpert Xpress SARS-CoV-2/FLU/RSV testing.  Fact Sheet for Patients: EntrepreneurPulse.com.au  Fact Sheet for Healthcare Providers: IncredibleEmployment.be  This test is not yet approved or cleared by the [place] FDA and has been authorized for detection and/or diagnosis of SARS-CoV-2 by FDA under an Emergency Use Authorization (EUA). This EUA will remain in effect (meaning this test can be used) for the duration of the COVID-19 declaration under Section 564(b)(1) of the Act, 21 U.S.C. section 360bbb-3(b)(1), unless the authorization is terminated or revoked.  Performed at [facility], [place] [address] [place], [place] [address]   Urine culture     Status: Abnormal   Collection Time: 07/26/20  1:44 PM   Specimen: Urine, Clean Catch  Result Value Ref Range Status   Specimen Description URINE, CLEAN CATCH  Final   Special Requests   Final    NONE Performed at [facility], [place] [address], [place], [place] [address]    Culture MULTIPLE SPECIES PRESENT, SUGGEST RECOLLECTION (A)  Final   Report Status 07/27/2020 FINAL  Final  Resp Panel by RT-PCR (Flu A&B, Covid) Nasopharyngeal Swab     Status: None   Collection Time: 07/27/20  7:36  PM   Specimen: Nasopharyngeal Swab; Nasopharyngeal(NP) swabs in vial transport medium  Result Value Ref Range Status   SARS Coronavirus 2 by RT PCR NEGATIVE NEGATIVE Final    Comment: (NOTE) SARS-CoV-2 target nucleic acids are NOT DETECTED.  The SARS-CoV-2 RNA is generally detectable in upper respiratory specimens during the acute phase of infection. The lowest concentration of SARS-CoV-2 viral copies this assay can detect is 138 copies/mL. A negative result does not preclude SARS-Cov-2 infection and should not be used as the sole basis for treatment or other patient management decisions. A negative result may occur with  improper specimen collection/handling, submission of specimen other than nasopharyngeal swab, presence of viral mutation(s) within the areas targeted by this assay, and inadequate number of viral copies(<138 copies/mL). A negative result must be combined with clinical observations, patient history, and epidemiological information. The expected result is Negative.  Fact Sheet for Patients:  EntrepreneurPulse.com.au  Fact Sheet for Healthcare Providers:  IncredibleEmployment.be  This test is no t yet approved or cleared by the [place] FDA and  has been authorized for detection and/or diagnosis of SARS-CoV-2 by FDA under an Emergency Use Authorization (EUA). This EUA will remain  in effect (meaning this test can be used) for the duration of the COVID-19 declaration under Section 564(b)(1) of the Act, 21 U.S.C.section 360bbb-3(b)(1), unless the authorization is terminated  or revoked sooner.       Influenza A by PCR NEGATIVE NEGATIVE Final   Influenza B by PCR NEGATIVE NEGATIVE Final    Comment: (NOTE) The Xpert Xpress SARS-CoV-2/FLU/RSV plus assay is intended as an aid in the diagnosis of influenza from Nasopharyngeal swab specimens and should not be used as a sole basis for treatment. Nasal washings and aspirates are  unacceptable for Xpert Xpress SARS-CoV-2/FLU/RSV testing.  Fact Sheet for Patients: EntrepreneurPulse.com.au  Fact Sheet for Healthcare Providers: IncredibleEmployment.be  This test is not yet approved or cleared by the [place] FDA and has been authorized for detection and/or diagnosis of SARS-CoV-2  by FDA under an Emergency Use Authorization (EUA). This EUA will remain in effect (meaning this test can be used) for the duration of the COVID-19 declaration under Section 564(b)(1) of the Act, 21 U.S.C. section 360bbb-3(b)(1), unless the authorization is terminated or revoked.  Performed at [facility], [place] [address] [place], [place] [address]   Culture, blood (routine x 2)     Status: None   Collection Time: 07/27/20  7:58 PM   Specimen: BLOOD  Result Value Ref Range Status   Specimen Description BLOOD RIGHT ANTECUBITAL  Final   Special Requests   Final    BOTTLES DRAWN AEROBIC AND ANAEROBIC Blood Culture results may not be optimal due to an inadequate volume of blood received in culture bottles   Culture   Final    NO GROWTH 5 DAYS Performed at [facility], [place] [address] [place], [place] [address]    Report Status 08/01/2020 FINAL  Final  Culture, blood (routine x 2)     Status: None   Collection Time: 07/27/20  8:42 PM   Specimen: BLOOD  Result Value Ref Range Status   Specimen Description BLOOD SITE NOT SPECIFIED  Final   Special Requests   Final    BOTTLES DRAWN AEROBIC AND ANAEROBIC Blood Culture results may not be optimal due to an inadequate volume of blood received in culture bottles   Culture   Final    NO GROWTH 5 DAYS Performed at [facility], [place] [address], [place], [place] [address]    Report Status 08/01/2020 FINAL  Final      Radiology Studies: No results found.  Marzetta Board, MD, PhD [facility]  Between 7 am - 7 pm I am available, please contact me via Amion (for  emergencies) or Securechat (non urgent messages)  Between 7 pm - 7 am I am not available, please contact night coverage MD/APP via Amion

## 2020-08-02 ENCOUNTER — Ambulatory Visit: Payer: Medicare Other | Admitting: Orthopaedic Surgery

## 2020-08-02 DIAGNOSIS — T847XXA Infection and inflammatory reaction due to other internal orthopedic prosthetic devices, implants and grafts, initial encounter: Secondary | ICD-10-CM | POA: Diagnosis not present

## 2020-08-02 DIAGNOSIS — L03115 Cellulitis of right lower limb: Secondary | ICD-10-CM | POA: Diagnosis not present

## 2020-08-02 LAB — RESP PANEL BY RT-PCR (FLU A&B, COVID) ARPGX2
Influenza A by PCR: NEGATIVE
Influenza B by PCR: NEGATIVE
SARS Coronavirus 2 by RT PCR: NEGATIVE

## 2020-08-02 MED ORDER — COVID-19 MRNA VACC (MODERNA) 50 MCG/0.25ML IM SUSP
0.2500 mL | Freq: Once | INTRAMUSCULAR | Status: AC
  Administered 2020-08-02: 0.25 mL via INTRAMUSCULAR
  Filled 2020-08-02: qty 0.25

## 2020-08-02 NOTE — Care Management Important Message (Signed)
Important Message  Patient Details  Name: Stephanie Donovan MRN: [id number] Date of Birth: 1927/08/12   Medicare Important Message Given:  Yes     [name] 08/02/2020, 3:33 PM

## 2020-08-02 NOTE — Progress Notes (Addendum)
PROGRESS NOTE  Stephanie Donovan IWP:[id number] DOB: 02/04/1927 DOA: 07/27/2020 PCP: System, Provider Not In   LOS: 6 days   Brief Narrative / Interim history: 87 year old female with history of dementia, hypothyroidism, HTN, who was recently hospitalized and discharged on 7/4 after being admitted for syncope and E. coli UTI comes in with an abnormality of the right distal femur associated with hardware failure.  She has a history of knee replacement, at baseline she is nonambulatory and uses a wheelchair.  It was noted that there was some metal protruding from her knee at the SNF as well as purulent discharge.  X-ray in the ED revealed worsening of the surgical hardware failure with at least 2 cm laceration migration of plate and screw fixation along the distal femur.  Orthopedic was consulted and she was admitted to the hospital.  There were plans initially for above-the-knee amputation however patient and family have decided against it and she will be treated palliatively with oral antibiotics.  Subjective / 24h Interval events: Doing well this morning, no complaints, no chest pain, no shortness of breath.  Mildly confused  Assessment & Plan: Principal Problem Right knee cellulitis, concern for hardware infection, POA, history of right distal femur fracture/dislocation-orthopedic surgery consulted.  There were initially plans in place for patient to get AKA by Dr. Sharol Given on 7/8 however patient does not want surgery despite the daughter's initial wishes for surgery.  Given patient's refusal surgery has been canceled.  Daughter is now on the same page as her mother.  Physical therapy worked with the patient and she will be discharged to SNF on [facility] on Wednesday.  Patient and her daughters hopes is that she will get strong enough so that she can return to [place] in a few weeks.  -She is COVID-negative today and will receive the Moderna booster at the request of [facility]  Active  Problems Recent E. coli UTI-currently on Rocephin for cellulitis.  Continue up until discharge, then will transition to doxycycline for a month as per orthopedic surgery NSVT-keep mag and potassium normal.  Beta-blocker has been increased.  Stable. Mild euvolemic hyponatremia-overall stable Hypothyroidism -continue Synthroid Essential hypertension-increase metoprolol, pressure stable Vitamin B12 deficiency -most recent B12 3338, successfully repleted as an outpatient Ambulatory dysfunction -She is wheelchair-bound Mild normocytic anemia-in the setting of chronic infection.  Hemoglobin stable  Goals of care-now DNR.  Palliative following. Mild dementia-stable  Scheduled Meds:  brimonidine  1 drop Left Eye BID   brinzolamide  1 drop Left Eye BID   COVID-19 mRNA vaccine (Moderna)  0.25 mL Intramuscular Once   enoxaparin (LOVENOX) injection  40 mg Subcutaneous Q24H   ferrous sulfate  325 mg Oral BID WC   levothyroxine  50 mcg Oral QAC breakfast   metoprolol succinate  25 mg Oral Daily   multivitamin with minerals  1 tablet Oral Daily   timolol  1 drop Both Eyes BID   Continuous Infusions:  cefTRIAXone (ROCEPHIN)  IV 2 g (08/01/20 2134)   PRN Meds:.acetaminophen, melatonin, morphine injection, ondansetron (ZOFRAN) IV, oxyCODONE, polyethylene glycol  Diet Orders (From admission, onward)     Start     Ordered   07/29/20 0851  Diet regular Room service appropriate? Yes; Fluid consistency: Thin  Diet effective now       Question Answer Comment  Room service appropriate? Yes   Fluid consistency: Thin      07/29/20 0850            DVT  prophylaxis: enoxaparin (LOVENOX) injection 40 mg Start: 07/28/20 0800 SCDs Start: 07/27/20 2116     Code Status: DNR  Family Communication: no family at bedside, discussed with daughter over the phone  Status is: Inpatient  Remains inpatient appropriate because:Inpatient level of care appropriate due to severity of illness  Dispo:  Patient  From: Home  Planned Disposition: Home with Health Care Svc  Medically stable for discharge: No    Level of care: Med-Surg  Consultants:  Orthopedic surgery  Procedures:  none  Microbiology  Blood culture 7/6-no growth to date  Antimicrobials: Ceftriaxone 7/6   Objective: Vitals:   08/02/20 0607 08/02/20 0857 08/02/20 1000 08/02/20 1228  BP: 121/72 102/64 111/66 103/69  Pulse: 93 95 90 82  Resp: 15 16 19 19   Temp: 97.9 F (36.6 C) 98.2 F (36.8 C) 97.6 F (36.4 C) 98.1 F (36.7 C)  TempSrc: Oral Oral Oral Oral  SpO2: 100% 99% 97% 95%  Weight:      Height:        Intake/Output Summary (Last 24 hours) at 08/02/2020 1351 Last data filed at 08/02/2020 1200 Gross per 24 hour  Intake 480 ml  Output 1700 ml  Net -1220 ml    Filed Weights   07/28/20 0150  Weight: 62.9 kg    Examination:  Constitutional: No distress Eyes: No scleral icterus ENMT: mmm Neck: normal, supple Respiratory: Clear bilaterally, no wheezing or crackles Cardiovascular: Regular rate and rhythm, no murmurs Abdomen: Soft, nontender, nondistended, bowel sounds positive Musculoskeletal: no clubbing / cyanosis.  Skin: No new rashes, small piece of metal protruding from the right knee Neurologic: No focal deficits  Data Reviewed: I have independently reviewed following labs and imaging studies   CBC: Recent Labs  Lab 07/27/20 1914 07/28/20 0658 07/29/20 0116 07/30/20 0101  WBC 8.3 7.3 7.3 8.5  NEUTROABS 5.6  --   --   --   HGB 10.7* 10.6* 9.5* 10.8*  HCT 33.8* 33.1* 29.7* 33.1*  MCV 93.1 90.7 90.8 90.9  PLT 263 259 265 867    Basic Metabolic Panel: Recent Labs  Lab 07/27/20 1914 07/27/20 2149 07/28/20 0658 07/29/20 0116 07/30/20 0101  NA 134*  --  134* 134* 134*  K 4.1  --  4.0 4.4 4.5  CL 99  --  101 101 102  CO2 27  --  28 27 28   GLUCOSE 106*  --  97 101* 103*  BUN 9  --  8 6* 8  CREATININE 0.57 0.52 0.57 0.66 0.55  CALCIUM 8.6*  --  8.6* 8.3* 8.6*  MG  --   --  1.9   --  2.0  PHOS  --   --  3.3  --   --     Liver Function Tests: Recent Labs  Lab 07/28/20 0658  AST 11*  ALT 15  ALKPHOS 80  BILITOT 0.6  PROT 6.0*  ALBUMIN 2.6*    Coagulation Profile: Recent Labs  Lab 07/28/20 0658  INR 1.1    HbA1C: No results for input(s): HGBA1C in the last 72 hours. CBG: No results for input(s): GLUCAP in the last 168 hours.   Recent Results (from the past 240 hour(s))  Resp Panel by RT-PCR (Flu A&B, Covid) Nasopharyngeal Swab     Status: None   Collection Time: 07/25/20 11:22 AM   Specimen: Nasopharyngeal Swab; Nasopharyngeal(NP) swabs in vial transport medium  Result Value Ref Range Status   SARS Coronavirus 2 by RT PCR NEGATIVE NEGATIVE Final  Comment: (NOTE) SARS-CoV-2 target nucleic acids are NOT DETECTED.  The SARS-CoV-2 RNA is generally detectable in upper respiratory specimens during the acute phase of infection. The lowest concentration of SARS-CoV-2 viral copies this assay can detect is 138 copies/mL. A negative result does not preclude SARS-Cov-2 infection and should not be used as the sole basis for treatment or other patient management decisions. A negative result may occur with  improper specimen collection/handling, submission of specimen other than nasopharyngeal swab, presence of viral mutation(s) within the areas targeted by this assay, and inadequate number of viral copies(<138 copies/mL). A negative result must be combined with clinical observations, patient history, and epidemiological information. The expected result is Negative.  Fact Sheet for Patients:  EntrepreneurPulse.com.au  Fact Sheet for Healthcare Providers:  IncredibleEmployment.be  This test is no t yet approved or cleared by the [place] FDA and  has been authorized for detection and/or diagnosis of SARS-CoV-2 by FDA under an Emergency Use Authorization (EUA). This EUA will remain  in effect (meaning this test  can be used) for the duration of the COVID-19 declaration under Section 564(b)(1) of the Act, 21 U.S.C.section 360bbb-3(b)(1), unless the authorization is terminated  or revoked sooner.       Influenza A by PCR NEGATIVE NEGATIVE Final   Influenza B by PCR NEGATIVE NEGATIVE Final    Comment: (NOTE) The Xpert Xpress SARS-CoV-2/FLU/RSV plus assay is intended as an aid in the diagnosis of influenza from Nasopharyngeal swab specimens and should not be used as a sole basis for treatment. Nasal washings and aspirates are unacceptable for Xpert Xpress SARS-CoV-2/FLU/RSV testing.  Fact Sheet for Patients: EntrepreneurPulse.com.au  Fact Sheet for Healthcare Providers: IncredibleEmployment.be  This test is not yet approved or cleared by the [place] FDA and has been authorized for detection and/or diagnosis of SARS-CoV-2 by FDA under an Emergency Use Authorization (EUA). This EUA will remain in effect (meaning this test can be used) for the duration of the COVID-19 declaration under Section 564(b)(1) of the Act, 21 U.S.C. section 360bbb-3(b)(1), unless the authorization is terminated or revoked.  Performed at [facility], [place] [address], [place], [place] [address]   Urine culture     Status: Abnormal   Collection Time: 07/26/20  1:44 PM   Specimen: Urine, Clean Catch  Result Value Ref Range Status   Specimen Description URINE, CLEAN CATCH  Final   Special Requests   Final    NONE Performed at [facility], [place] [address], [place], [place] [address]    Culture MULTIPLE SPECIES PRESENT, SUGGEST RECOLLECTION (A)  Final   Report Status 07/27/2020 FINAL  Final  Resp Panel by RT-PCR (Flu A&B, Covid) Nasopharyngeal Swab     Status: None   Collection Time: 07/27/20  7:36 PM   Specimen: Nasopharyngeal Swab; Nasopharyngeal(NP) swabs in vial transport medium  Result Value Ref Range Status   SARS Coronavirus 2 by RT PCR NEGATIVE NEGATIVE  Final    Comment: (NOTE) SARS-CoV-2 target nucleic acids are NOT DETECTED.  The SARS-CoV-2 RNA is generally detectable in upper respiratory specimens during the acute phase of infection. The lowest concentration of SARS-CoV-2 viral copies this assay can detect is 138 copies/mL. A negative result does not preclude SARS-Cov-2 infection and should not be used as the sole basis for treatment or other patient management decisions. A negative result may occur with  improper specimen collection/handling, submission of specimen other than nasopharyngeal swab, presence of viral mutation(s) within the areas targeted by this  assay, and inadequate number of viral copies(<138 copies/mL). A negative result must be combined with clinical observations, patient history, and epidemiological information. The expected result is Negative.  Fact Sheet for Patients:  EntrepreneurPulse.com.au  Fact Sheet for Healthcare Providers:  IncredibleEmployment.be  This test is no t yet approved or cleared by the [place] FDA and  has been authorized for detection and/or diagnosis of SARS-CoV-2 by FDA under an Emergency Use Authorization (EUA). This EUA will remain  in effect (meaning this test can be used) for the duration of the COVID-19 declaration under Section 564(b)(1) of the Act, 21 U.S.C.section 360bbb-3(b)(1), unless the authorization is terminated  or revoked sooner.       Influenza A by PCR NEGATIVE NEGATIVE Final   Influenza B by PCR NEGATIVE NEGATIVE Final    Comment: (NOTE) The Xpert Xpress SARS-CoV-2/FLU/RSV plus assay is intended as an aid in the diagnosis of influenza from Nasopharyngeal swab specimens and should not be used as a sole basis for treatment. Nasal washings and aspirates are unacceptable for Xpert Xpress SARS-CoV-2/FLU/RSV testing.  Fact Sheet for Patients: EntrepreneurPulse.com.au  Fact Sheet for Healthcare  Providers: IncredibleEmployment.be  This test is not yet approved or cleared by the [place] FDA and has been authorized for detection and/or diagnosis of SARS-CoV-2 by FDA under an Emergency Use Authorization (EUA). This EUA will remain in effect (meaning this test can be used) for the duration of the COVID-19 declaration under Section 564(b)(1) of the Act, 21 U.S.C. section 360bbb-3(b)(1), unless the authorization is terminated or revoked.  Performed at [facility], [place] [address] [place], [place] [address]   Culture, blood (routine x 2)     Status: None   Collection Time: 07/27/20  7:58 PM   Specimen: BLOOD  Result Value Ref Range Status   Specimen Description BLOOD RIGHT ANTECUBITAL  Final   Special Requests   Final    BOTTLES DRAWN AEROBIC AND ANAEROBIC Blood Culture results may not be optimal due to an inadequate volume of blood received in culture bottles   Culture   Final    NO GROWTH 5 DAYS Performed at [facility], [place] [address] [place], [place] [address]    Report Status 08/01/2020 FINAL  Final  Culture, blood (routine x 2)     Status: None   Collection Time: 07/27/20  8:42 PM   Specimen: BLOOD  Result Value Ref Range Status   Specimen Description BLOOD SITE NOT SPECIFIED  Final   Special Requests   Final    BOTTLES DRAWN AEROBIC AND ANAEROBIC Blood Culture results may not be optimal due to an inadequate volume of blood received in culture bottles   Culture   Final    NO GROWTH 5 DAYS Performed at [facility], [place] [address] [place], [place] [address]    Report Status 08/01/2020 FINAL  Final  Resp Panel by RT-PCR (Flu A&B, Covid) Nasopharyngeal Swab     Status: None   Collection Time: 08/02/20  8:49 AM   Specimen: Nasopharyngeal Swab; Nasopharyngeal(NP) swabs in vial transport medium  Result Value Ref Range Status   SARS Coronavirus 2 by RT PCR NEGATIVE NEGATIVE Final    Comment: (NOTE) SARS-CoV-2 target nucleic  acids are NOT DETECTED.  The SARS-CoV-2 RNA is generally detectable in upper respiratory specimens during the acute phase of infection. The lowest concentration of SARS-CoV-2 viral copies this assay can detect is 138 copies/mL. A negative result does not preclude SARS-Cov-2 infection and  should not be used as the sole basis for treatment or other patient management decisions. A negative result may occur with  improper specimen collection/handling, submission of specimen other than nasopharyngeal swab, presence of viral mutation(s) within the areas targeted by this assay, and inadequate number of viral copies(<138 copies/mL). A negative result must be combined with clinical observations, patient history, and epidemiological information. The expected result is Negative.  Fact Sheet for Patients:  EntrepreneurPulse.com.au  Fact Sheet for Healthcare Providers:  IncredibleEmployment.be  This test is no t yet approved or cleared by the [place] FDA and  has been authorized for detection and/or diagnosis of SARS-CoV-2 by FDA under an Emergency Use Authorization (EUA). This EUA will remain  in effect (meaning this test can be used) for the duration of the COVID-19 declaration under Section 564(b)(1) of the Act, 21 U.S.C.section 360bbb-3(b)(1), unless the authorization is terminated  or revoked sooner.       Influenza A by PCR NEGATIVE NEGATIVE Final   Influenza B by PCR NEGATIVE NEGATIVE Final    Comment: (NOTE) The Xpert Xpress SARS-CoV-2/FLU/RSV plus assay is intended as an aid in the diagnosis of influenza from Nasopharyngeal swab specimens and should not be used as a sole basis for treatment. Nasal washings and aspirates are unacceptable for Xpert Xpress SARS-CoV-2/FLU/RSV testing.  Fact Sheet for Patients: EntrepreneurPulse.com.au  Fact Sheet for Healthcare Providers: IncredibleEmployment.be  This  test is not yet approved or cleared by the [place] FDA and has been authorized for detection and/or diagnosis of SARS-CoV-2 by FDA under an Emergency Use Authorization (EUA). This EUA will remain in effect (meaning this test can be used) for the duration of the COVID-19 declaration under Section 564(b)(1) of the Act, 21 U.S.C. section 360bbb-3(b)(1), unless the authorization is terminated or revoked.  Performed at [facility], [place] [address] [place], [place] [address]       Radiology Studies: No results found.  Marzetta Board, MD, PhD [facility]  Between 7 am - 7 pm I am available, please contact me via Amion (for emergencies) or Securechat (non urgent messages)  Between 7 pm - 7 am I am not available, please contact night coverage MD/APP via Amion

## 2020-08-02 NOTE — TOC Progression Note (Signed)
Transition of Care [facility]) - Progression Note    Patient Details  Name: JAWANDA PASSEY MRN: [id number] Date of Birth: 1927-12-16  Transition of Care [facility]) CM/SW [place], [place] Phone Number: 08/02/2020, 1:43 PM  Clinical Narrative:     CSW spoke to [facility] about bed. [facility] informed CSW that pt would need to have 2 covid booster vaccines to be fully vaccinated. [facility] was able to look at [facility] vaccination log to see that pt received 3 prior covid vaccines. CSW spoke to pts daughter who agreed to booster.   [facility] will likely have a bed for pt tomorrow.   Expected Discharge Plan: [place] Barriers to Discharge: SNF Pending bed offer, Continued Medical Work up  Expected Discharge Plan and Services Expected Discharge Plan: [place] Choice: [place] arrangements for the past 2 months: Assisted Living Facility                                       Social Determinants of Health (SDOH) Interventions    Readmission Risk Interventions No flowsheet data found.  Emeterio Reeve, Latanya Presser, [place] Social Worker [phone]

## 2020-08-03 LAB — CREATININE, SERUM
Creatinine, Ser: 0.63 mg/dL (ref 0.44–1.00)
GFR, Estimated: >60 mL/min (ref >60)

## 2020-08-03 MED ORDER — OXYCODONE HCL 5 MG PO TABS: 5.0000 mg | ORAL_TABLET | Freq: Four times a day (QID) | ORAL | 0 refills | Status: DC | PRN

## 2020-08-03 MED ORDER — METOPROLOL SUCCINATE ER 25 MG PO TB24: 25.0000 mg | ORAL_TABLET | Freq: Every day | ORAL | Status: DC

## 2020-08-03 MED ORDER — DOXYCYCLINE HYCLATE 100 MG PO TABS: 100.0000 mg | ORAL_TABLET | Freq: Two times a day (BID) | ORAL | Status: DC

## 2020-08-03 MED ORDER — DOXYCYCLINE HYCLATE 100 MG PO TABS
100.0000 mg | ORAL_TABLET | Freq: Two times a day (BID) | ORAL | Status: DC
  Administered 2020-08-03 – 2020-08-04 (×3): 100 mg via ORAL
  Filled 2020-08-03 (×3): qty 1

## 2020-08-03 NOTE — Discharge Summary (Signed)
Physician Discharge Summary  Stephanie Donovan KXF:[id number] DOB: Aug 24, 1927 DOA: 07/27/2020  PCP: System, Provider Not In  Admit date: 07/27/2020 Discharge date: 08/03/2020  Admitted From: ALF Disposition:  SNF  Recommendations for Outpatient Follow-up:  Follow up with Ortho in 1-2 weeks Please obtain BMP/CBC in one week Palliative care to meet with family at facility to discuss goals of care  Home Health:no Equipment/Devices:None  Discharge Condition:Guarded CODE STATUS:DNR Diet recommendation: Heart Healthy   Brief/Interim Summary: 87 y.o. female past medical history of dementia, hypothyroidism, essential pretension recently discharged from the hospital on 07/25/2020 for syncope and E. coli UTI comes in for right-sided distal femur abnormality concern about hardware failure.  It was noted that she had some metal protruding from her knee at skilled as well as purulent discharge and x-ray in the ED revealed worsening surgical hardware failure with at least 2 mm laceration.  Surgery was consulted they recommended an above-the-knee amputation however family decided against it so she was treated with oral antibiotics and palliation.  Discharge Diagnoses:  Active Problems:   Acute lower UTI   Cellulitis   Hardware complicating wound infection ([place])  Cellulitis/ Hardware complicating wound infection [facility]) Orthopedic surgery was consulted and there was a plan for an above the knee amputation on 07/29/2020 however the patient does not want surgical intervention despite daughter's initial wishes. Currently on IV Rocephin. Orthopedic surgery recommended to continue doxycycline orally as an outpatient. Speaking to the daughter and patient they have unrealistic expectation about the outcome.  They relate that she is on antibiotics but that hardware which is protruding through the skin is not infected and the patient will not want her leg amputated. I was unable to approach a palliative  care/hospice perspective due to the extreme denial. Continue wound care dressing per instructions.   History of a recent E. coli UTI: She has completed her course of antibiotics in house  NoneSVT: Continue beta-blockers.  Very mild hyponatremia Appears euvolemic.  Hypothyroidism Continue Synthroid.  Essential hypertension Continue current home regimen no changes made.   Discharge Instructions  Discharge Instructions     Diet - low sodium heart healthy   Complete by: As directed    Discharge wound care:   Complete by: As directed    As per wound care instructions   Increase activity slowly   Complete by: As directed       Allergies as of 08/03/2020       Reactions   Aspirin Palpitations, Other (See Comments)   Unspecified Mixed reactions   Epinephrine Other (See Comments)   Rapped heart beat   Penicillins Other (See Comments)   Lost hearing temporarily DID THE REACTION INVOLVE: Swelling of the face/tongue/throat, SOB, or low BP? No Sudden or severe rash/hives, skin peeling, or the inside of the mouth or nose? No Did it require medical treatment? No When did it last happen?       If all above answers are "NO", may proceed with cephalosporin use.   Codeine Palpitations        Medication List     STOP taking these medications    cefdinir 300 MG capsule Commonly known as: OMNICEF   cyanocobalamin 100 MCG tablet   vitamin B-12 1000 MCG tablet Commonly known as: CYANOCOBALAMIN       TAKE these medications    acetaminophen 500 MG tablet Commonly known as: TYLENOL Take 500 mg by mouth in the morning and at bedtime.   aspirin EC 81 MG tablet Take  81 mg by mouth daily.   brimonidine 0.1 % Soln Commonly known as: ALPHAGAN P Place 1 drop into the left eye in the morning and at bedtime.   brinzolamide 1 % ophthalmic suspension Commonly known as: AZOPT Place 1 drop into both eyes in the morning and at bedtime.   docusate sodium 100 MG  capsule Commonly known as: COLACE Take 100 mg by mouth daily.   doxycycline 100 MG tablet Commonly known as: VIBRA-TABS Take 1 tablet (100 mg total) by mouth every 12 (twelve) hours.   furosemide 20 MG tablet Commonly known as: LASIX Take 20 mg by mouth daily.   levothyroxine 50 MCG tablet Commonly known as: SYNTHROID Take 50 mcg by mouth daily before breakfast.   magnesium oxide 400 (241.3 Mg) MG tablet Commonly known as: MAG-OX Take 1 tablet (400 mg total) by mouth 2 (two) times daily.   metoprolol succinate 25 MG 24 hr tablet Commonly known as: TOPROL-XL Take 12.5 mg by mouth daily.   oxyCODONE 5 MG immediate release tablet Commonly known as: Oxy IR/ROXICODONE Take 1 tablet (5 mg total) by mouth every 6 (six) hours as needed for moderate pain or breakthrough pain.   timolol 0.25 % ophthalmic solution Commonly known as: TIMOPTIC Place 1 drop into both eyes 2 (two) times daily.               Discharge Care Instructions  (From admission, onward)           Start     Ordered   08/03/20 0000  Discharge wound care:       Comments: As per wound care instructions   08/03/20 0936            Follow-up Information     Newt Minion, MD Follow up in 1 week(s).   Specialty: Orthopedic Surgery Contact information: [address] [place] [address] [phone]                Allergies  Allergen Reactions   Aspirin Palpitations and Other (See Comments)    Unspecified Mixed reactions   Epinephrine Other (See Comments)    Rapped heart beat   Penicillins Other (See Comments)    Lost hearing temporarily DID THE REACTION INVOLVE: Swelling of the face/tongue/throat, SOB, or low BP? No Sudden or severe rash/hives, skin peeling, or the inside of the mouth or nose? No Did it require medical treatment? No When did it last happen?       If all above answers are "NO", may proceed with cephalosporin use.    Codeine Palpitations     Consultations: Orthopedic surgery   Procedures/Studies: DG Chest 1 View  Result Date: 07/22/2020 CLINICAL DATA:  Syncope. EXAM: CHEST  1 VIEW COMPARISON:  January 20, 2020. FINDINGS: The heart size and mediastinal contours are within normal limits. Both lungs are clear. The visualized skeletal structures are unremarkable. IMPRESSION: No active disease. Aortic Atherosclerosis (ICD10-I70.0). Electronically Signed   By: Marijo Conception M.D.   On: 07/22/2020 11:33   DG Pelvis 1-2 Views  Result Date: 07/22/2020 CLINICAL DATA:  Status post fall. Right femur deformity. Initial encounter. EXAM: PELVIS - 1-2 VIEW COMPARISON:  Plain films right hip 09/22/2017. FINDINGS: Bones are osteopenic. No acute bony or joint abnormality is identified. Bipolar right hip hemiarthroplasty is unchanged. Calcifications projecting in the upper pelvis are also unchanged and may be calcified lymph nodes. IMPRESSION: No acute abnormality. Osteopenia. Electronically Signed   By: Inge Rise M.D.  On: 07/22/2020 11:33   CT Head Wo Contrast  Result Date: 07/22/2020 CLINICAL DATA:  Syncopal episode altered mental status EXAM: CT HEAD WITHOUT CONTRAST TECHNIQUE: Contiguous axial images were obtained from the base of the skull through the vertex without intravenous contrast. COMPARISON:  CT brain 11/28/2019 FINDINGS: Brain: No acute territorial infarction, hemorrhage, or intracranial mass. Atrophy and chronic small vessel ischemic changes of the white matter. Stable ventricle size. Vascular: No hyperdense vessels.  Carotid vascular calcification Skull: Normal. Negative for fracture or focal lesion. Sinuses/Orbits: No acute finding. Other: None IMPRESSION: 1. No CT evidence for acute intracranial abnormality. 2. Atrophy and chronic small vessel ischemic change of the white matter Electronically Signed   By: Donavan Foil M.D.   On: 07/22/2020 15:16   DG Knee Complete 4 Views Right  Result Date: 07/27/2020 CLINICAL DATA:   Pain of right knee few days ago. No trauma. Head surgery of right femur 12/20/2017 EXAM: RIGHT KNEE - COMPLETE 4+ VIEW COMPARISON:  X-ray right knee 06/23/2020, x-ray right knee 12/20/2017 FINDINGS: Interval worsening of surgical hardware failure with at least 2 cm lateral migration of plate and screw fixation along the distal femur. Redemonstration of at least 1 fractured screw. Re-fracture through a distal femoral shaft fracture and bone graft. Tricompartmental severe degenerative changes of the right knee. IMPRESSION: 1. Interval worsening of surgical hardware failure with at least 2 cm lateral migration of plate and screw fixation along the distal femur. Redemonstration of at least 1 fractured screw. 2. Re-fracture through a distal femoral shaft fracture and bone graft. Electronically Signed   By: Iven Finn M.D.   On: 07/27/2020 18:39   ECHOCARDIOGRAM COMPLETE  Result Date: 07/23/2020    ECHOCARDIOGRAM REPORT   Patient Name:   FALINE LANGER Date of Exam: 07/23/2020 Medical Rec #:  [id number]           Height:       64.0 in Accession #:    [id number]          Weight:       133.2 lb Date of Birth:  1927/03/09           BSA:          1.646 m Patient Age:    28 years            BP:           138/89 mmHg Patient Gender: F                   HR:           107 bpm. Exam Location:  Inpatient Procedure: 2D Echo, Cardiac Doppler and Color Doppler Indications:    CHF  History:        Patient has prior history of Echocardiogram examinations, most                 recent 11/29/2019. TIA, Arrythmias:Atrial Fibrillation;                 Signs/Symptoms:Altered Mental Status and Syncope.  Sonographer:    Dustin Flock Referring Phys: Graylin Shiver [facility]  Sonographer Comments: Image acquisition challenging due to respiratory motion and Patient is confused. IMPRESSIONS  1. Left ventricular ejection fraction, by estimation, is 60 to 65%. The left ventricle has normal function. The left ventricle has no regional wall  motion abnormalities. Left ventricular diastolic parameters are consistent with Grade I diastolic dysfunction (impaired relaxation).  2. Right ventricular systolic function is  normal. The right ventricular size is normal. There is mildly elevated pulmonary artery systolic pressure.  3. The mitral valve is normal in structure. Mild mitral valve regurgitation. No evidence of mitral stenosis.  4. The aortic valve is tricuspid. Aortic valve regurgitation is not visualized. Mild aortic valve sclerosis is present, with no evidence of aortic valve stenosis.  5. The inferior vena cava is normal in size with greater than 50% respiratory variability, suggesting right atrial pressure of 3 mmHg. FINDINGS  Left Ventricle: Left ventricular ejection fraction, by estimation, is 60 to 65%. The left ventricle has normal function. The left ventricle has no regional wall motion abnormalities. The left ventricular internal cavity size was normal in size. There is  no left ventricular hypertrophy. Left ventricular diastolic parameters are consistent with Grade I diastolic dysfunction (impaired relaxation). Right Ventricle: The right ventricular size is normal. Right ventricular systolic function is normal. There is mildly elevated pulmonary artery systolic pressure. The tricuspid regurgitant velocity is 3.01 m/s, and with an assumed right atrial pressure of 3 mmHg, the estimated right ventricular systolic pressure is 28.4 mmHg. Left Atrium: Left atrial size was normal in size. Right Atrium: Right atrial size was normal in size. Pericardium: There is no evidence of pericardial effusion. Mitral Valve: The mitral valve is normal in structure. Mild mitral annular calcification. Mild mitral valve regurgitation. No evidence of mitral valve stenosis. Tricuspid Valve: The tricuspid valve is normal in structure. Tricuspid valve regurgitation is trivial. No evidence of tricuspid stenosis. Aortic Valve: The aortic valve is tricuspid. Aortic valve  regurgitation is not visualized. Mild aortic valve sclerosis is present, with no evidence of aortic valve stenosis. Pulmonic Valve: The pulmonic valve was normal in structure. Pulmonic valve regurgitation is not visualized. No evidence of pulmonic stenosis. Aorta: The aortic root is normal in size and structure. Venous: The inferior vena cava is normal in size with greater than 50% respiratory variability, suggesting right atrial pressure of 3 mmHg. IAS/Shunts: The interatrial septum was not well visualized.  LEFT VENTRICLE PLAX 2D LVIDd:         4.00 cm  Diastology LVIDs:         3.00 cm  LV e' medial:    6.20 cm/s LV PW:         0.80 cm  LV E/e' medial:  10.3 LV IVS:        0.90 cm  LV e' lateral:   5.66 cm/s LVOT diam:     2.00 cm  LV E/e' lateral: 11.3 LV SV:         48 LV SV Index:   29 LVOT Area:     3.14 cm  RIGHT VENTRICLE RV Basal diam:  3.50 cm RV S prime:     13.60 cm/s TAPSE (M-mode): 1.7 cm LEFT ATRIUM             Index       RIGHT ATRIUM           Index LA diam:        3.00 cm 1.82 cm/m  RA Area:     12.00 cm LA Vol (A2C):   20.1 ml 12.21 ml/m RA Volume:   29.40 ml  17.86 ml/m LA Vol (A4C):   39.5 ml 24.00 ml/m LA Biplane Vol: 30.2 ml 18.35 ml/m  AORTIC VALVE LVOT Vmax:   115.00 cm/s LVOT Vmean:  71.900 cm/s LVOT VTI:    0.153 m  AORTA Ao Root diam: 2.90 cm MITRAL VALVE  TRICUSPID VALVE MV Area (PHT): 3.95 cm     TR Peak grad:   36.2 mmHg MV Decel Time: 192 msec     TR Vmax:        301.00 cm/s MV E velocity: 63.70 cm/s MV A velocity: 100.00 cm/s  SHUNTS MV E/A ratio:  0.64         Systemic VTI:  0.15 m                             Systemic Diam: 2.00 cm Kirk Ruths MD Electronically signed by Kirk Ruths MD Signature Date/Time: 07/23/2020/12:11:05 PM    Final    DG FEMUR, MIN 2 VIEWS RIGHT  Result Date: 07/22/2020 CLINICAL DATA:  Status post fall. Right femur deformity. Initial encounter. EXAM: RIGHT FEMUR 2 VIEWS COMPARISON:  Plain films right femur 06/23/2020 and  02/26/2018. FINDINGS: Plate and screws for fixation of a distal femur fracture are again seen. Has seen on the most recent examination, there is no bridging bone across the metaphysis of the femur. Two of the most distal screws are broken and the distal end of the plate is laterally and inferiorly displaced, new since the 2020 exam but unchanged since the most recent exam. Position and alignment are unchanged since the most recent study. No new abnormality. Osteopenia noted. IMPRESSION: No acute finding. Fracture of the distal femur seen on the most recent examination is unchanged. Fixation hardware is in place. As seen on the most recent comparison, two of the distal most screws are broken and the distal aspect of the plate is laterally and inferiorly displaced. Electronically Signed   By: Inge Rise M.D.   On: 07/22/2020 11:38   (Echo, Carotid, EGD, Colonoscopy, ERCP)    Subjective: No complaints  Discharge Exam: Vitals:   08/03/20 0536 08/03/20 0806  BP: (!) 113/56 118/71  Pulse: 81 80  Resp: 18 15  Temp: 98.4 F (36.9 C) 97.7 F (36.5 C)  SpO2: 100% 95%   Vitals:   08/02/20 2137 08/03/20 0205 08/03/20 0536 08/03/20 0806  BP: 121/79 120/84 (!) 113/56 118/71  Pulse: 88 89 81 80  Resp: 20 18 18 15   Temp: 97.7 F (36.5 C) 97.7 F (36.5 C) 98.4 F (36.9 C) 97.7 F (36.5 C)  TempSrc:  Oral Oral Oral  SpO2: 96% 97% 100% 95%  Weight:      Height:        General: Pt is alert, awake, not in acute distress Cardiovascular: RRR, S1/S2 +, no rubs, no gallops Respiratory: CTA bilaterally, no wheezing, no rhonchi Abdominal: Soft, NT, ND, bowel sounds + Extremities: no edema, no cyanosis    The results of significant diagnostics from this hospitalization (including imaging, microbiology, ancillary and laboratory) are listed below for reference.     Microbiology: Recent Results (from the past 240 hour(s))  Resp Panel by RT-PCR (Flu A&B, Covid) Nasopharyngeal Swab     Status:  None   Collection Time: 07/25/20 11:22 AM   Specimen: Nasopharyngeal Swab; Nasopharyngeal(NP) swabs in vial transport medium  Result Value Ref Range Status   SARS Coronavirus 2 by RT PCR NEGATIVE NEGATIVE Final    Comment: (NOTE) SARS-CoV-2 target nucleic acids are NOT DETECTED.  The SARS-CoV-2 RNA is generally detectable in upper respiratory specimens during the acute phase of infection. The lowest concentration of SARS-CoV-2 viral copies this assay can detect is 138 copies/mL. A negative result does not preclude SARS-Cov-2 infection and should  not be used as the sole basis for treatment or other patient management decisions. A negative result may occur with  improper specimen collection/handling, submission of specimen other than nasopharyngeal swab, presence of viral mutation(s) within the areas targeted by this assay, and inadequate number of viral copies(<138 copies/mL). A negative result must be combined with clinical observations, patient history, and epidemiological information. The expected result is Negative.  Fact Sheet for Patients:  EntrepreneurPulse.com.au  Fact Sheet for Healthcare Providers:  IncredibleEmployment.be  This test is no t yet approved or cleared by the [place] FDA and  has been authorized for detection and/or diagnosis of SARS-CoV-2 by FDA under an Emergency Use Authorization (EUA). This EUA will remain  in effect (meaning this test can be used) for the duration of the COVID-19 declaration under Section 564(b)(1) of the Act, 21 U.S.C.section 360bbb-3(b)(1), unless the authorization is terminated  or revoked sooner.       Influenza A by PCR NEGATIVE NEGATIVE Final   Influenza B by PCR NEGATIVE NEGATIVE Final    Comment: (NOTE) The Xpert Xpress SARS-CoV-2/FLU/RSV plus assay is intended as an aid in the diagnosis of influenza from Nasopharyngeal swab specimens and should not be used as a sole basis for  treatment. Nasal washings and aspirates are unacceptable for Xpert Xpress SARS-CoV-2/FLU/RSV testing.  Fact Sheet for Patients: EntrepreneurPulse.com.au  Fact Sheet for Healthcare Providers: IncredibleEmployment.be  This test is not yet approved or cleared by the [place] FDA and has been authorized for detection and/or diagnosis of SARS-CoV-2 by FDA under an Emergency Use Authorization (EUA). This EUA will remain in effect (meaning this test can be used) for the duration of the COVID-19 declaration under Section 564(b)(1) of the Act, 21 U.S.C. section 360bbb-3(b)(1), unless the authorization is terminated or revoked.  Performed at [facility], [place] [address] [place], [place] [address]   Urine culture     Status: Abnormal   Collection Time: 07/26/20  1:44 PM   Specimen: Urine, Clean Catch  Result Value Ref Range Status   Specimen Description URINE, CLEAN CATCH  Final   Special Requests   Final    NONE Performed at [facility], [place] [address] [place], [place] [address]    Culture MULTIPLE SPECIES PRESENT, SUGGEST RECOLLECTION (A)  Final   Report Status 07/27/2020 FINAL  Final  Resp Panel by RT-PCR (Flu A&B, Covid) Nasopharyngeal Swab     Status: None   Collection Time: 07/27/20  7:36 PM   Specimen: Nasopharyngeal Swab; Nasopharyngeal(NP) swabs in vial transport medium  Result Value Ref Range Status   SARS Coronavirus 2 by RT PCR NEGATIVE NEGATIVE Final    Comment: (NOTE) SARS-CoV-2 target nucleic acids are NOT DETECTED.  The SARS-CoV-2 RNA is generally detectable in upper respiratory specimens during the acute phase of infection. The lowest concentration of SARS-CoV-2 viral copies this assay can detect is 138 copies/mL. A negative result does not preclude SARS-Cov-2 infection and should not be used as the sole basis for treatment or other patient management decisions. A negative result may occur with  improper  specimen collection/handling, submission of specimen other than nasopharyngeal swab, presence of viral mutation(s) within the areas targeted by this assay, and inadequate number of viral copies(<138 copies/mL). A negative result must be combined with clinical observations, patient history, and epidemiological information. The expected result is Negative.  Fact Sheet for Patients:  EntrepreneurPulse.com.au  Fact Sheet for Healthcare Providers:  IncredibleEmployment.be  This test is no t yet approved  or cleared by the [place] and  has been authorized for detection and/or diagnosis of SARS-CoV-2 by FDA under an Emergency Use Authorization (EUA). This EUA will remain  in effect (meaning this test can be used) for the duration of the COVID-19 declaration under Section 564(b)(1) of the Act, 21 U.S.C.section 360bbb-3(b)(1), unless the authorization is terminated  or revoked sooner.       Influenza A by PCR NEGATIVE NEGATIVE Final   Influenza B by PCR NEGATIVE NEGATIVE Final    Comment: (NOTE) The Xpert Xpress SARS-CoV-2/FLU/RSV plus assay is intended as an aid in the diagnosis of influenza from Nasopharyngeal swab specimens and should not be used as a sole basis for treatment. Nasal washings and aspirates are unacceptable for Xpert Xpress SARS-CoV-2/FLU/RSV testing.  Fact Sheet for Patients: EntrepreneurPulse.com.au  Fact Sheet for Healthcare Providers: IncredibleEmployment.be  This test is not yet approved or cleared by the [place] FDA and has been authorized for detection and/or diagnosis of SARS-CoV-2 by FDA under an Emergency Use Authorization (EUA). This EUA will remain in effect (meaning this test can be used) for the duration of the COVID-19 declaration under Section 564(b)(1) of the Act, 21 U.S.C. section 360bbb-3(b)(1), unless the authorization is terminated or revoked.  Performed at  [facility], [place] [address], [place], [place] [address]   Culture, blood (routine x 2)     Status: None   Collection Time: 07/27/20  7:58 PM   Specimen: BLOOD  Result Value Ref Range Status   Specimen Description BLOOD RIGHT ANTECUBITAL  Final   Special Requests   Final    BOTTLES DRAWN AEROBIC AND ANAEROBIC Blood Culture results may not be optimal due to an inadequate volume of blood received in culture bottles   Culture   Final    NO GROWTH 5 DAYS Performed at [facility], [place] [address], [place], [place] [address]    Report Status 08/01/2020 FINAL  Final  Culture, blood (routine x 2)     Status: None   Collection Time: 07/27/20  8:42 PM   Specimen: BLOOD  Result Value Ref Range Status   Specimen Description BLOOD SITE NOT SPECIFIED  Final   Special Requests   Final    BOTTLES DRAWN AEROBIC AND ANAEROBIC Blood Culture results may not be optimal due to an inadequate volume of blood received in culture bottles   Culture   Final    NO GROWTH 5 DAYS Performed at [facility], [place] [address], [place], [place] [address]    Report Status 08/01/2020 FINAL  Final  Resp Panel by RT-PCR (Flu A&B, Covid) Nasopharyngeal Swab     Status: None   Collection Time: 08/02/20  8:49 AM   Specimen: Nasopharyngeal Swab; Nasopharyngeal(NP) swabs in vial transport medium  Result Value Ref Range Status   SARS Coronavirus 2 by RT PCR NEGATIVE NEGATIVE Final    Comment: (NOTE) SARS-CoV-2 target nucleic acids are NOT DETECTED.  The SARS-CoV-2 RNA is generally detectable in upper respiratory specimens during the acute phase of infection. The lowest concentration of SARS-CoV-2 viral copies this assay can detect is 138 copies/mL. A negative result does not preclude SARS-Cov-2 infection and should not be used as the sole basis for treatment or other patient management decisions. A negative result may occur with  improper specimen collection/handling, submission of specimen other than  nasopharyngeal swab, presence of viral mutation(s) within the areas targeted by this assay, and inadequate number of viral copies(<138 copies/mL).  A negative result must be combined with clinical observations, patient history, and epidemiological information. The expected result is Negative.  Fact Sheet for Patients:  EntrepreneurPulse.com.au  Fact Sheet for Healthcare Providers:  IncredibleEmployment.be  This test is no t yet approved or cleared by the [place] FDA and  has been authorized for detection and/or diagnosis of SARS-CoV-2 by FDA under an Emergency Use Authorization (EUA). This EUA will remain  in effect (meaning this test can be used) for the duration of the COVID-19 declaration under Section 564(b)(1) of the Act, 21 U.S.C.section 360bbb-3(b)(1), unless the authorization is terminated  or revoked sooner.       Influenza A by PCR NEGATIVE NEGATIVE Final   Influenza B by PCR NEGATIVE NEGATIVE Final    Comment: (NOTE) The Xpert Xpress SARS-CoV-2/FLU/RSV plus assay is intended as an aid in the diagnosis of influenza from Nasopharyngeal swab specimens and should not be used as a sole basis for treatment. Nasal washings and aspirates are unacceptable for Xpert Xpress SARS-CoV-2/FLU/RSV testing.  Fact Sheet for Patients: EntrepreneurPulse.com.au  Fact Sheet for Healthcare Providers: IncredibleEmployment.be  This test is not yet approved or cleared by the [place] FDA and has been authorized for detection and/or diagnosis of SARS-CoV-2 by FDA under an Emergency Use Authorization (EUA). This EUA will remain in effect (meaning this test can be used) for the duration of the COVID-19 declaration under Section 564(b)(1) of the Act, 21 U.S.C. section 360bbb-3(b)(1), unless the authorization is terminated or revoked.  Performed at [facility], [place] [address] [place], [place] [address]       Labs: BNP (last 3 results) Recent Labs    01/12/20 0955 07/22/20 1502 07/23/20 0409  BNP 114.3* 129.2* 478.2*   Basic Metabolic Panel: Recent Labs  Lab 07/27/20 1914 07/27/20 2149 07/28/20 0658 07/29/20 0116 07/30/20 0101 08/03/20 0556  NA 134*  --  134* 134* 134*  --   K 4.1  --  4.0 4.4 4.5  --   CL 99  --  101 101 102  --   CO2 27  --  28 27 28   --   GLUCOSE 106*  --  97 101* 103*  --   BUN 9  --  8 6* 8  --   CREATININE 0.57 0.52 0.57 0.66 0.55 0.63  CALCIUM 8.6*  --  8.6* 8.3* 8.6*  --   MG  --   --  1.9  --  2.0  --   PHOS  --   --  3.3  --   --   --    Liver Function Tests: Recent Labs  Lab 07/28/20 0658  AST 11*  ALT 15  ALKPHOS 80  BILITOT 0.6  PROT 6.0*  ALBUMIN 2.6*   No results for input(s): LIPASE, AMYLASE in the last 168 hours. No results for input(s): AMMONIA in the last 168 hours. CBC: Recent Labs  Lab 07/27/20 1914 07/28/20 0658 07/29/20 0116 07/30/20 0101  WBC 8.3 7.3 7.3 8.5  NEUTROABS 5.6  --   --   --   HGB 10.7* 10.6* 9.5* 10.8*  HCT 33.8* 33.1* 29.7* 33.1*  MCV 93.1 90.7 90.8 90.9  PLT 263 259 265 329   Cardiac Enzymes: No results for input(s): CKTOTAL, CKMB, CKMBINDEX, TROPONINI in the last 168 hours. BNP: Invalid input(s): POCBNP CBG: No results for input(s): GLUCAP in the last 168 hours. D-Dimer No results for input(s): DDIMER in the last 72 hours. Hgb A1c No results for input(s):  HGBA1C in the last 72 hours. Lipid Profile No results for input(s): CHOL, HDL, LDLCALC, TRIG, CHOLHDL, LDLDIRECT in the last 72 hours. Thyroid function studies No results for input(s): TSH, T4TOTAL, T3FREE, THYROIDAB in the last 72 hours.  Invalid input(s): FREET3 Anemia work up No results for input(s): VITAMINB12, FOLATE, FERRITIN, TIBC, IRON, RETICCTPCT in the last 72 hours. Urinalysis    Component Value Date/Time   COLORURINE YELLOW 07/29/2020 1736   APPEARANCEUR HAZY (A) 07/29/2020 1736   LABSPEC 1.010 07/29/2020 1736    PHURINE 8.0 07/29/2020 1736   GLUCOSEU NEGATIVE 07/29/2020 1736   HGBUR SMALL (A) 07/29/2020 1736   BILIRUBINUR NEGATIVE 07/29/2020 1736   KETONESUR NEGATIVE 07/29/2020 1736   PROTEINUR NEGATIVE 07/29/2020 1736   UROBILINOGEN 0.2 08/25/2014 0454   NITRITE NEGATIVE 07/29/2020 1736   LEUKOCYTESUR NEGATIVE 07/29/2020 1736   Sepsis Labs Invalid input(s): PROCALCITONIN,  WBC,  LACTICIDVEN Microbiology Recent Results (from the past 240 hour(s))  Resp Panel by RT-PCR (Flu A&B, Covid) Nasopharyngeal Swab     Status: None   Collection Time: 07/25/20 11:22 AM   Specimen: Nasopharyngeal Swab; Nasopharyngeal(NP) swabs in vial transport medium  Result Value Ref Range Status   SARS Coronavirus 2 by RT PCR NEGATIVE NEGATIVE Final    Comment: (NOTE) SARS-CoV-2 target nucleic acids are NOT DETECTED.  The SARS-CoV-2 RNA is generally detectable in upper respiratory specimens during the acute phase of infection. The lowest concentration of SARS-CoV-2 viral copies this assay can detect is 138 copies/mL. A negative result does not preclude SARS-Cov-2 infection and should not be used as the sole basis for treatment or other patient management decisions. A negative result may occur with  improper specimen collection/handling, submission of specimen other than nasopharyngeal swab, presence of viral mutation(s) within the areas targeted by this assay, and inadequate number of viral copies(<138 copies/mL). A negative result must be combined with clinical observations, patient history, and epidemiological information. The expected result is Negative.  Fact Sheet for Patients:  EntrepreneurPulse.com.au  Fact Sheet for Healthcare Providers:  IncredibleEmployment.be  This test is no t yet approved or cleared by the [place] FDA and  has been authorized for detection and/or diagnosis of SARS-CoV-2 by FDA under an Emergency Use Authorization (EUA). This EUA will  remain  in effect (meaning this test can be used) for the duration of the COVID-19 declaration under Section 564(b)(1) of the Act, 21 U.S.C.section 360bbb-3(b)(1), unless the authorization is terminated  or revoked sooner.       Influenza A by PCR NEGATIVE NEGATIVE Final   Influenza B by PCR NEGATIVE NEGATIVE Final    Comment: (NOTE) The Xpert Xpress SARS-CoV-2/FLU/RSV plus assay is intended as an aid in the diagnosis of influenza from Nasopharyngeal swab specimens and should not be used as a sole basis for treatment. Nasal washings and aspirates are unacceptable for Xpert Xpress SARS-CoV-2/FLU/RSV testing.  Fact Sheet for Patients: EntrepreneurPulse.com.au  Fact Sheet for Healthcare Providers: IncredibleEmployment.be  This test is not yet approved or cleared by the [place] FDA and has been authorized for detection and/or diagnosis of SARS-CoV-2 by FDA under an Emergency Use Authorization (EUA). This EUA will remain in effect (meaning this test can be used) for the duration of the COVID-19 declaration under Section 564(b)(1) of the Act, 21 U.S.C. section 360bbb-3(b)(1), unless the authorization is terminated or revoked.  Performed at [facility], [place] [address] [place], [place] [address]   Urine culture     Status: Abnormal   Collection Time:  07/26/20  1:44 PM   Specimen: Urine, Clean Catch  Result Value Ref Range Status   Specimen Description URINE, CLEAN CATCH  Final   Special Requests   Final    NONE Performed at [facility], [address], [place], [place] [address]    Culture MULTIPLE SPECIES PRESENT, SUGGEST RECOLLECTION (A)  Final   Report Status 07/27/2020 FINAL  Final  Resp Panel by RT-PCR (Flu A&B, Covid) Nasopharyngeal Swab     Status: None   Collection Time: 07/27/20  7:36 PM   Specimen: Nasopharyngeal Swab; Nasopharyngeal(NP) swabs in vial transport medium  Result Value Ref Range Status   SARS  Coronavirus 2 by RT PCR NEGATIVE NEGATIVE Final    Comment: (NOTE) SARS-CoV-2 target nucleic acids are NOT DETECTED.  The SARS-CoV-2 RNA is generally detectable in upper respiratory specimens during the acute phase of infection. The lowest concentration of SARS-CoV-2 viral copies this assay can detect is 138 copies/mL. A negative result does not preclude SARS-Cov-2 infection and should not be used as the sole basis for treatment or other patient management decisions. A negative result may occur with  improper specimen collection/handling, submission of specimen other than nasopharyngeal swab, presence of viral mutation(s) within the areas targeted by this assay, and inadequate number of viral copies(<138 copies/mL). A negative result must be combined with clinical observations, patient history, and epidemiological information. The expected result is Negative.  Fact Sheet for Patients:  EntrepreneurPulse.com.au  Fact Sheet for Healthcare Providers:  IncredibleEmployment.be  This test is no t yet approved or cleared by the [place] FDA and  has been authorized for detection and/or diagnosis of SARS-CoV-2 by FDA under an Emergency Use Authorization (EUA). This EUA will remain  in effect (meaning this test can be used) for the duration of the COVID-19 declaration under Section 564(b)(1) of the Act, 21 U.S.C.section 360bbb-3(b)(1), unless the authorization is terminated  or revoked sooner.       Influenza A by PCR NEGATIVE NEGATIVE Final   Influenza B by PCR NEGATIVE NEGATIVE Final    Comment: (NOTE) The Xpert Xpress SARS-CoV-2/FLU/RSV plus assay is intended as an aid in the diagnosis of influenza from Nasopharyngeal swab specimens and should not be used as a sole basis for treatment. Nasal washings and aspirates are unacceptable for Xpert Xpress SARS-CoV-2/FLU/RSV testing.  Fact Sheet for  Patients: EntrepreneurPulse.com.au  Fact Sheet for Healthcare Providers: IncredibleEmployment.be  This test is not yet approved or cleared by the [place] FDA and has been authorized for detection and/or diagnosis of SARS-CoV-2 by FDA under an Emergency Use Authorization (EUA). This EUA will remain in effect (meaning this test can be used) for the duration of the COVID-19 declaration under Section 564(b)(1) of the Act, 21 U.S.C. section 360bbb-3(b)(1), unless the authorization is terminated or revoked.  Performed at [facility], [place] [address], [place], [place] [address]   Culture, blood (routine x 2)     Status: None   Collection Time: 07/27/20  7:58 PM   Specimen: BLOOD  Result Value Ref Range Status   Specimen Description BLOOD RIGHT ANTECUBITAL  Final   Special Requests   Final    BOTTLES DRAWN AEROBIC AND ANAEROBIC Blood Culture results may not be optimal due to an inadequate volume of blood received in culture bottles   Culture   Final    NO GROWTH 5 DAYS Performed at [facility], [place] [address] [place], [place] [address]    Report Status 08/01/2020  FINAL  Final  Culture, blood (routine x 2)     Status: None   Collection Time: 07/27/20  8:42 PM   Specimen: BLOOD  Result Value Ref Range Status   Specimen Description BLOOD SITE NOT SPECIFIED  Final   Special Requests   Final    BOTTLES DRAWN AEROBIC AND ANAEROBIC Blood Culture results may not be optimal due to an inadequate volume of blood received in culture bottles   Culture   Final    NO GROWTH 5 DAYS Performed at [facility], [place] [address], [place], [place] [address]    Report Status 08/01/2020 FINAL  Final  Resp Panel by RT-PCR (Flu A&B, Covid) Nasopharyngeal Swab     Status: None   Collection Time: 08/02/20  8:49 AM   Specimen: Nasopharyngeal Swab; Nasopharyngeal(NP) swabs in vial transport medium  Result Value Ref Range Status   SARS Coronavirus 2  by RT PCR NEGATIVE NEGATIVE Final    Comment: (NOTE) SARS-CoV-2 target nucleic acids are NOT DETECTED.  The SARS-CoV-2 RNA is generally detectable in upper respiratory specimens during the acute phase of infection. The lowest concentration of SARS-CoV-2 viral copies this assay can detect is 138 copies/mL. A negative result does not preclude SARS-Cov-2 infection and should not be used as the sole basis for treatment or other patient management decisions. A negative result may occur with  improper specimen collection/handling, submission of specimen other than nasopharyngeal swab, presence of viral mutation(s) within the areas targeted by this assay, and inadequate number of viral copies(<138 copies/mL). A negative result must be combined with clinical observations, patient history, and epidemiological information. The expected result is Negative.  Fact Sheet for Patients:  EntrepreneurPulse.com.au  Fact Sheet for Healthcare Providers:  IncredibleEmployment.be  This test is no t yet approved or cleared by the [place] FDA and  has been authorized for detection and/or diagnosis of SARS-CoV-2 by FDA under an Emergency Use Authorization (EUA). This EUA will remain  in effect (meaning this test can be used) for the duration of the COVID-19 declaration under Section 564(b)(1) of the Act, 21 U.S.C.section 360bbb-3(b)(1), unless the authorization is terminated  or revoked sooner.       Influenza A by PCR NEGATIVE NEGATIVE Final   Influenza B by PCR NEGATIVE NEGATIVE Final    Comment: (NOTE) The Xpert Xpress SARS-CoV-2/FLU/RSV plus assay is intended as an aid in the diagnosis of influenza from Nasopharyngeal swab specimens and should not be used as a sole basis for treatment. Nasal washings and aspirates are unacceptable for Xpert Xpress SARS-CoV-2/FLU/RSV testing.  Fact Sheet for Patients: EntrepreneurPulse.com.au  Fact  Sheet for Healthcare Providers: IncredibleEmployment.be  This test is not yet approved or cleared by the [place] FDA and has been authorized for detection and/or diagnosis of SARS-CoV-2 by FDA under an Emergency Use Authorization (EUA). This EUA will remain in effect (meaning this test can be used) for the duration of the COVID-19 declaration under Section 564(b)(1) of the Act, 21 U.S.C. section 360bbb-3(b)(1), unless the authorization is terminated or revoked.  Performed at [facility], [place] [address] [place], [place] [address]      Time coordinating discharge: Over 30 minutes  SIGNED:   Charlynne Cousins, MD  [facility] 08/03/2020, 9:42 AM Pager   If 7PM-7AM, please contact night-coverage [email or web address] Password [email or web address]

## 2020-08-04 ENCOUNTER — Telehealth: Payer: Self-pay

## 2020-08-04 DIAGNOSIS — T847XXD Infection and inflammatory reaction due to other internal orthopedic prosthetic devices, implants and grafts, subsequent encounter: Secondary | ICD-10-CM | POA: Diagnosis not present

## 2020-08-04 DIAGNOSIS — L03115 Cellulitis of right lower limb: Secondary | ICD-10-CM | POA: Diagnosis not present

## 2020-08-04 NOTE — Progress Notes (Signed)
Stephanie Donovan to be D/C'd  per MD order.  Discussed with the patient and all questions fully answered. Gave report to Admissions RN at [facility].  VSS, Skin clean, dry and intact without evidence of skin break down, no evidence of skin tears noted.   An After Visit Summary was printed and given to PTAR. PTAR received prescription.  D/c education completed with patient/family including follow up instructions, medication list, d/c activities limitations if indicated, with other d/c instructions as indicated by MD - patient able to verbalize understanding, all questions fully answered.   Patient instructed to return to ED, call 911, or call MD for any changes in condition.   Patient to be escorted via PTAR tp [facility].

## 2020-08-04 NOTE — Discharge Summary (Signed)
Physician Discharge Summary  Stephanie Donovan WTU:[id number] DOB: July 31, 1927 DOA: 07/27/2020  PCP: System, Provider Not In  Admit date: 07/27/2020 Discharge date: 08/04/2020  Admitted From: ALF Disposition:  SNF  Recommendations for Outpatient Follow-up:  Follow up with Ortho in 1-2 weeks Please obtain BMP/CBC in one week Palliative care to meet with family at facility to discuss goals of care, patient has an extremely poor prognosis.  Home Health:no Equipment/Devices:None  Discharge Condition:Guarded CODE STATUS:DNR Diet recommendation: Heart Healthy   Brief/Interim Summary: 87 y.o. female past medical history of dementia, hypothyroidism, essential pretension recently discharged from the hospital on 07/25/2020 for syncope and E. coli UTI comes in for right-sided distal femur abnormality concern about hardware failure.  It was noted that she had some metal protruding from her knee at skilled as well as purulent discharge and x-ray in the ED revealed worsening surgical hardware failure with at least 2 mm laceration.  Surgery was consulted they recommended an above-the-knee amputation however family decided against it so she was treated with oral antibiotics and palliation.  Discharge Diagnoses:  Active Problems:   Acute lower UTI   Cellulitis   Hardware complicating wound infection ([place])  Cellulitis/ Hardware complicating wound infection [facility]) Orthopedic surgery was consulted and there was a plan for an above the knee amputation on 07/29/2020 however the patient does not want surgical intervention despite daughter's initial wishes. Currently on IV Rocephin. Orthopedic surgery recommended to continue doxycycline orally as an outpatient. Speaking to the daughter and patient they have unrealistic expectation about the outcome.  They relate that she is on antibiotics but that hardware which is protruding through the skin is not infected and the patient will not want her leg amputated. I was  unable to approach a palliative care/hospice perspective due to the extreme denial. Continue wound care dressing per instructions.   History of a recent E. coli UTI: She has completed her course of antibiotics in house  NoneSVT: Continue beta-blockers.  Very mild hyponatremia Appears euvolemic.  Hypothyroidism Continue Synthroid.  Essential hypertension Continue current home regimen no changes made.   Discharge Instructions  Discharge Instructions     Diet - low sodium heart healthy   Complete by: As directed    Discharge wound care:   Complete by: As directed    As per wound care instructions   Increase activity slowly   Complete by: As directed       Allergies as of 08/04/2020       Reactions   Aspirin Palpitations, Other (See Comments)   Unspecified Mixed reactions   Epinephrine Other (See Comments)   Rapped heart beat   Penicillins Other (See Comments)   Lost hearing temporarily DID THE REACTION INVOLVE: Swelling of the face/tongue/throat, SOB, or low BP? No Sudden or severe rash/hives, skin peeling, or the inside of the mouth or nose? No Did it require medical treatment? No When did it last happen?       If all above answers are "NO", may proceed with cephalosporin use.   Codeine Palpitations        Medication List     STOP taking these medications    cefdinir 300 MG capsule Commonly known as: OMNICEF   cyanocobalamin 100 MCG tablet   vitamin B-12 1000 MCG tablet Commonly known as: CYANOCOBALAMIN       TAKE these medications    acetaminophen 500 MG tablet Commonly known as: TYLENOL Take 500 mg by mouth in the morning and at bedtime.  aspirin EC 81 MG tablet Take 81 mg by mouth daily.   brimonidine 0.1 % Soln Commonly known as: ALPHAGAN P Place 1 drop into the left eye in the morning and at bedtime.   brinzolamide 1 % ophthalmic suspension Commonly known as: AZOPT Place 1 drop into both eyes in the morning and at bedtime.    docusate sodium 100 MG capsule Commonly known as: COLACE Take 100 mg by mouth daily.   doxycycline 100 MG tablet Commonly known as: VIBRA-TABS Take 1 tablet (100 mg total) by mouth every 12 (twelve) hours.   furosemide 20 MG tablet Commonly known as: LASIX Take 20 mg by mouth daily.   levothyroxine 50 MCG tablet Commonly known as: SYNTHROID Take 50 mcg by mouth daily before breakfast.   magnesium oxide 400 (241.3 Mg) MG tablet Commonly known as: MAG-OX Take 1 tablet (400 mg total) by mouth 2 (two) times daily.   metoprolol succinate 25 MG 24 hr tablet Commonly known as: TOPROL-XL Take 12.5 mg by mouth daily. What changed: Another medication with the same name was added. Make sure you understand how and when to take each.   metoprolol succinate 25 MG 24 hr tablet Commonly known as: TOPROL-XL Take 1 tablet (25 mg total) by mouth daily. What changed: You were already taking a medication with the same name, and this prescription was added. Make sure you understand how and when to take each.   oxyCODONE 5 MG immediate release tablet Commonly known as: Oxy IR/ROXICODONE Take 1 tablet (5 mg total) by mouth every 6 (six) hours as needed for moderate pain or breakthrough pain.   timolol 0.25 % ophthalmic solution Commonly known as: TIMOPTIC Place 1 drop into both eyes 2 (two) times daily.               Discharge Care Instructions  (From admission, onward)           Start     Ordered   08/03/20 0000  Discharge wound care:       Comments: As per wound care instructions   08/03/20 0936            Follow-up Information     Newt Minion, MD Follow up in 1 week(s).   Specialty: Orthopedic Surgery Contact information: [address] [place] [address] [phone]                Allergies  Allergen Reactions   Aspirin Palpitations and Other (See Comments)    Unspecified Mixed reactions   Epinephrine Other (See Comments)    Rapped heart beat    Penicillins Other (See Comments)    Lost hearing temporarily DID THE REACTION INVOLVE: Swelling of the face/tongue/throat, SOB, or low BP? No Sudden or severe rash/hives, skin peeling, or the inside of the mouth or nose? No Did it require medical treatment? No When did it last happen?       If all above answers are "NO", may proceed with cephalosporin use.    Codeine Palpitations    Consultations: Orthopedic surgery   Procedures/Studies: DG Chest 1 View  Result Date: 07/22/2020 CLINICAL DATA:  Syncope. EXAM: CHEST  1 VIEW COMPARISON:  January 20, 2020. FINDINGS: The heart size and mediastinal contours are within normal limits. Both lungs are clear. The visualized skeletal structures are unremarkable. IMPRESSION: No active disease. Aortic Atherosclerosis (ICD10-I70.0). Electronically Signed   By: Marijo Conception M.D.   On: 07/22/2020 11:33   DG Pelvis 1-2 Views  Result Date: 07/22/2020 CLINICAL DATA:  Status post fall. Right femur deformity. Initial encounter. EXAM: PELVIS - 1-2 VIEW COMPARISON:  Plain films right hip 09/22/2017. FINDINGS: Bones are osteopenic. No acute bony or joint abnormality is identified. Bipolar right hip hemiarthroplasty is unchanged. Calcifications projecting in the upper pelvis are also unchanged and may be calcified lymph nodes. IMPRESSION: No acute abnormality. Osteopenia. Electronically Signed   By: Inge Rise M.D.   On: 07/22/2020 11:33   CT Head Wo Contrast  Result Date: 07/22/2020 CLINICAL DATA:  Syncopal episode altered mental status EXAM: CT HEAD WITHOUT CONTRAST TECHNIQUE: Contiguous axial images were obtained from the base of the skull through the vertex without intravenous contrast. COMPARISON:  CT brain 11/28/2019 FINDINGS: Brain: No acute territorial infarction, hemorrhage, or intracranial mass. Atrophy and chronic small vessel ischemic changes of the white matter. Stable ventricle size. Vascular: No hyperdense vessels.  Carotid vascular  calcification Skull: Normal. Negative for fracture or focal lesion. Sinuses/Orbits: No acute finding. Other: None IMPRESSION: 1. No CT evidence for acute intracranial abnormality. 2. Atrophy and chronic small vessel ischemic change of the white matter Electronically Signed   By: Donavan Foil M.D.   On: 07/22/2020 15:16   DG Knee Complete 4 Views Right  Result Date: 07/27/2020 CLINICAL DATA:  Pain of right knee few days ago. No trauma. Head surgery of right femur 12/20/2017 EXAM: RIGHT KNEE - COMPLETE 4+ VIEW COMPARISON:  X-ray right knee 06/23/2020, x-ray right knee 12/20/2017 FINDINGS: Interval worsening of surgical hardware failure with at least 2 cm lateral migration of plate and screw fixation along the distal femur. Redemonstration of at least 1 fractured screw. Re-fracture through a distal femoral shaft fracture and bone graft. Tricompartmental severe degenerative changes of the right knee. IMPRESSION: 1. Interval worsening of surgical hardware failure with at least 2 cm lateral migration of plate and screw fixation along the distal femur. Redemonstration of at least 1 fractured screw. 2. Re-fracture through a distal femoral shaft fracture and bone graft. Electronically Signed   By: Iven Finn M.D.   On: 07/27/2020 18:39   ECHOCARDIOGRAM COMPLETE  Result Date: 07/23/2020    ECHOCARDIOGRAM REPORT   Patient Name:   DNYA HICKLE Date of Exam: 07/23/2020 Medical Rec #:  [id number]           Height:       64.0 in Accession #:    [id number]          Weight:       133.2 lb Date of Birth:  December 20, 1927           BSA:          1.646 m Patient Age:    7 years            BP:           138/89 mmHg Patient Gender: F                   HR:           107 bpm. Exam Location:  Inpatient Procedure: 2D Echo, Cardiac Doppler and Color Doppler Indications:    CHF  History:        Patient has prior history of Echocardiogram examinations, most                 recent 11/29/2019. TIA, Arrythmias:Atrial Fibrillation;                  Signs/Symptoms:Altered Mental Status and Syncope.  Sonographer:    Dustin Flock Referring Phys: Graylin Shiver [facility]  Sonographer Comments: Image acquisition challenging due to respiratory motion and Patient is confused. IMPRESSIONS  1. Left ventricular ejection fraction, by estimation, is 60 to 65%. The left ventricle has normal function. The left ventricle has no regional wall motion abnormalities. Left ventricular diastolic parameters are consistent with Grade I diastolic dysfunction (impaired relaxation).  2. Right ventricular systolic function is normal. The right ventricular size is normal. There is mildly elevated pulmonary artery systolic pressure.  3. The mitral valve is normal in structure. Mild mitral valve regurgitation. No evidence of mitral stenosis.  4. The aortic valve is tricuspid. Aortic valve regurgitation is not visualized. Mild aortic valve sclerosis is present, with no evidence of aortic valve stenosis.  5. The inferior vena cava is normal in size with greater than 50% respiratory variability, suggesting right atrial pressure of 3 mmHg. FINDINGS  Left Ventricle: Left ventricular ejection fraction, by estimation, is 60 to 65%. The left ventricle has normal function. The left ventricle has no regional wall motion abnormalities. The left ventricular internal cavity size was normal in size. There is  no left ventricular hypertrophy. Left ventricular diastolic parameters are consistent with Grade I diastolic dysfunction (impaired relaxation). Right Ventricle: The right ventricular size is normal. Right ventricular systolic function is normal. There is mildly elevated pulmonary artery systolic pressure. The tricuspid regurgitant velocity is 3.01 m/s, and with an assumed right atrial pressure of 3 mmHg, the estimated right ventricular systolic pressure is 23.5 mmHg. Left Atrium: Left atrial size was normal in size. Right Atrium: Right atrial size was normal in size. Pericardium: There  is no evidence of pericardial effusion. Mitral Valve: The mitral valve is normal in structure. Mild mitral annular calcification. Mild mitral valve regurgitation. No evidence of mitral valve stenosis. Tricuspid Valve: The tricuspid valve is normal in structure. Tricuspid valve regurgitation is trivial. No evidence of tricuspid stenosis. Aortic Valve: The aortic valve is tricuspid. Aortic valve regurgitation is not visualized. Mild aortic valve sclerosis is present, with no evidence of aortic valve stenosis. Pulmonic Valve: The pulmonic valve was normal in structure. Pulmonic valve regurgitation is not visualized. No evidence of pulmonic stenosis. Aorta: The aortic root is normal in size and structure. Venous: The inferior vena cava is normal in size with greater than 50% respiratory variability, suggesting right atrial pressure of 3 mmHg. IAS/Shunts: The interatrial septum was not well visualized.  LEFT VENTRICLE PLAX 2D LVIDd:         4.00 cm  Diastology LVIDs:         3.00 cm  LV e' medial:    6.20 cm/s LV PW:         0.80 cm  LV E/e' medial:  10.3 LV IVS:        0.90 cm  LV e' lateral:   5.66 cm/s LVOT diam:     2.00 cm  LV E/e' lateral: 11.3 LV SV:         48 LV SV Index:   29 LVOT Area:     3.14 cm  RIGHT VENTRICLE RV Basal diam:  3.50 cm RV S prime:     13.60 cm/s TAPSE (M-mode): 1.7 cm LEFT ATRIUM             Index       RIGHT ATRIUM           Index LA diam:        3.00 cm 1.82 cm/m  RA Area:     12.00 cm LA Vol (A2C):   20.1 ml 12.21 ml/m RA Volume:   29.40 ml  17.86 ml/m LA Vol (A4C):   39.5 ml 24.00 ml/m LA Biplane Vol: 30.2 ml 18.35 ml/m  AORTIC VALVE LVOT Vmax:   115.00 cm/s LVOT Vmean:  71.900 cm/s LVOT VTI:    0.153 m  AORTA Ao Root diam: 2.90 cm MITRAL VALVE                TRICUSPID VALVE MV Area (PHT): 3.95 cm     TR Peak grad:   36.2 mmHg MV Decel Time: 192 msec     TR Vmax:        301.00 cm/s MV E velocity: 63.70 cm/s MV A velocity: 100.00 cm/s  SHUNTS MV E/A ratio:  0.64         Systemic  VTI:  0.15 m                             Systemic Diam: 2.00 cm Kirk Ruths MD Electronically signed by Kirk Ruths MD Signature Date/Time: 07/23/2020/12:11:05 PM    Final    DG FEMUR, MIN 2 VIEWS RIGHT  Result Date: 07/22/2020 CLINICAL DATA:  Status post fall. Right femur deformity. Initial encounter. EXAM: RIGHT FEMUR 2 VIEWS COMPARISON:  Plain films right femur 06/23/2020 and 02/26/2018. FINDINGS: Plate and screws for fixation of a distal femur fracture are again seen. Has seen on the most recent examination, there is no bridging bone across the metaphysis of the femur. Two of the most distal screws are broken and the distal end of the plate is laterally and inferiorly displaced, new since the 2020 exam but unchanged since the most recent exam. Position and alignment are unchanged since the most recent study. No new abnormality. Osteopenia noted. IMPRESSION: No acute finding. Fracture of the distal femur seen on the most recent examination is unchanged. Fixation hardware is in place. As seen on the most recent comparison, two of the distal most screws are broken and the distal aspect of the plate is laterally and inferiorly displaced. Electronically Signed   By: Inge Rise M.D.   On: 07/22/2020 11:38   (Echo, Carotid, EGD, Colonoscopy, ERCP)    Subjective: No complaints  Discharge Exam: Vitals:   08/04/20 0400 08/04/20 0751  BP: 106/66 116/68  Pulse: 80 93  Resp: 17 17  Temp: (!) 97.4 F (36.3 C) 97.7 F (36.5 C)  SpO2: 98% 100%   Vitals:   08/03/20 2136 08/04/20 0036 08/04/20 0400 08/04/20 0751  BP: 121/72 114/87 106/66 116/68  Pulse: 87 89 80 93  Resp: 18 17 17 17   Temp: 98 F (36.7 C) 97.7 F (36.5 C) (!) 97.4 F (36.3 C) 97.7 F (36.5 C)  TempSrc: Oral Oral Oral Oral  SpO2: 96% 96% 98% 100%  Weight:      Height:        General: Pt is alert, awake, not in acute distress Cardiovascular: RRR, S1/S2 +, no rubs, no gallops Respiratory: CTA bilaterally, no wheezing,  no rhonchi Abdominal: Soft, NT, ND, bowel sounds + Extremities: no edema, no cyanosis    The results of significant diagnostics from this hospitalization (including imaging, microbiology, ancillary and laboratory) are listed below for reference.     Microbiology: Recent Results (from the past 240 hour(s))  Resp Panel by RT-PCR (Flu A&B, Covid) Nasopharyngeal Swab     Status: None  Collection Time: 07/25/20 11:22 AM   Specimen: Nasopharyngeal Swab; Nasopharyngeal(NP) swabs in vial transport medium  Result Value Ref Range Status   SARS Coronavirus 2 by RT PCR NEGATIVE NEGATIVE Final    Comment: (NOTE) SARS-CoV-2 target nucleic acids are NOT DETECTED.  The SARS-CoV-2 RNA is generally detectable in upper respiratory specimens during the acute phase of infection. The lowest concentration of SARS-CoV-2 viral copies this assay can detect is 138 copies/mL. A negative result does not preclude SARS-Cov-2 infection and should not be used as the sole basis for treatment or other patient management decisions. A negative result may occur with  improper specimen collection/handling, submission of specimen other than nasopharyngeal swab, presence of viral mutation(s) within the areas targeted by this assay, and inadequate number of viral copies(<138 copies/mL). A negative result must be combined with clinical observations, patient history, and epidemiological information. The expected result is Negative.  Fact Sheet for Patients:  EntrepreneurPulse.com.au  Fact Sheet for Healthcare Providers:  IncredibleEmployment.be  This test is no t yet approved or cleared by the [place] FDA and  has been authorized for detection and/or diagnosis of SARS-CoV-2 by FDA under an Emergency Use Authorization (EUA). This EUA will remain  in effect (meaning this test can be used) for the duration of the COVID-19 declaration under Section 564(b)(1) of the Act,  21 U.S.C.section 360bbb-3(b)(1), unless the authorization is terminated  or revoked sooner.       Influenza A by PCR NEGATIVE NEGATIVE Final   Influenza B by PCR NEGATIVE NEGATIVE Final    Comment: (NOTE) The Xpert Xpress SARS-CoV-2/FLU/RSV plus assay is intended as an aid in the diagnosis of influenza from Nasopharyngeal swab specimens and should not be used as a sole basis for treatment. Nasal washings and aspirates are unacceptable for Xpert Xpress SARS-CoV-2/FLU/RSV testing.  Fact Sheet for Patients: EntrepreneurPulse.com.au  Fact Sheet for Healthcare Providers: IncredibleEmployment.be  This test is not yet approved or cleared by the [place] FDA and has been authorized for detection and/or diagnosis of SARS-CoV-2 by FDA under an Emergency Use Authorization (EUA). This EUA will remain in effect (meaning this test can be used) for the duration of the COVID-19 declaration under Section 564(b)(1) of the Act, 21 U.S.C. section 360bbb-3(b)(1), unless the authorization is terminated or revoked.  Performed at [facility], [place] [address], [place], [place] [address]   Urine culture     Status: Abnormal   Collection Time: 07/26/20  1:44 PM   Specimen: Urine, Clean Catch  Result Value Ref Range Status   Specimen Description URINE, CLEAN CATCH  Final   Special Requests   Final    NONE Performed at [facility], [place] [address] [place], [place] [address]    Culture MULTIPLE SPECIES PRESENT, SUGGEST RECOLLECTION (A)  Final   Report Status 07/27/2020 FINAL  Final  Resp Panel by RT-PCR (Flu A&B, Covid) Nasopharyngeal Swab     Status: None   Collection Time: 07/27/20  7:36 PM   Specimen: Nasopharyngeal Swab; Nasopharyngeal(NP) swabs in vial transport medium  Result Value Ref Range Status   SARS Coronavirus 2 by RT PCR NEGATIVE NEGATIVE Final    Comment: (NOTE) SARS-CoV-2 target nucleic acids are NOT DETECTED.  The SARS-CoV-2  RNA is generally detectable in upper respiratory specimens during the acute phase of infection. The lowest concentration of SARS-CoV-2 viral copies this assay can detect is 138 copies/mL. A negative result does not preclude SARS-Cov-2 infection and should not be used as the  sole basis for treatment or other patient management decisions. A negative result may occur with  improper specimen collection/handling, submission of specimen other than nasopharyngeal swab, presence of viral mutation(s) within the areas targeted by this assay, and inadequate number of viral copies(<138 copies/mL). A negative result must be combined with clinical observations, patient history, and epidemiological information. The expected result is Negative.  Fact Sheet for Patients:  EntrepreneurPulse.com.au  Fact Sheet for Healthcare Providers:  IncredibleEmployment.be  This test is no t yet approved or cleared by the [place] FDA and  has been authorized for detection and/or diagnosis of SARS-CoV-2 by FDA under an Emergency Use Authorization (EUA). This EUA will remain  in effect (meaning this test can be used) for the duration of the COVID-19 declaration under Section 564(b)(1) of the Act, 21 U.S.C.section 360bbb-3(b)(1), unless the authorization is terminated  or revoked sooner.       Influenza A by PCR NEGATIVE NEGATIVE Final   Influenza B by PCR NEGATIVE NEGATIVE Final    Comment: (NOTE) The Xpert Xpress SARS-CoV-2/FLU/RSV plus assay is intended as an aid in the diagnosis of influenza from Nasopharyngeal swab specimens and should not be used as a sole basis for treatment. Nasal washings and aspirates are unacceptable for Xpert Xpress SARS-CoV-2/FLU/RSV testing.  Fact Sheet for Patients: EntrepreneurPulse.com.au  Fact Sheet for Healthcare Providers: IncredibleEmployment.be  This test is not yet approved or cleared by the  [place] FDA and has been authorized for detection and/or diagnosis of SARS-CoV-2 by FDA under an Emergency Use Authorization (EUA). This EUA will remain in effect (meaning this test can be used) for the duration of the COVID-19 declaration under Section 564(b)(1) of the Act, 21 U.S.C. section 360bbb-3(b)(1), unless the authorization is terminated or revoked.  Performed at [facility], [place] [address] [place], [place] [address]   Culture, blood (routine x 2)     Status: None   Collection Time: 07/27/20  7:58 PM   Specimen: BLOOD  Result Value Ref Range Status   Specimen Description BLOOD RIGHT ANTECUBITAL  Final   Special Requests   Final    BOTTLES DRAWN AEROBIC AND ANAEROBIC Blood Culture results may not be optimal due to an inadequate volume of blood received in culture bottles   Culture   Final    NO GROWTH 5 DAYS Performed at [facility], [place] [address], [place], [place] [address]    Report Status 08/01/2020 FINAL  Final  Culture, blood (routine x 2)     Status: None   Collection Time: 07/27/20  8:42 PM   Specimen: BLOOD  Result Value Ref Range Status   Specimen Description BLOOD SITE NOT SPECIFIED  Final   Special Requests   Final    BOTTLES DRAWN AEROBIC AND ANAEROBIC Blood Culture results may not be optimal due to an inadequate volume of blood received in culture bottles   Culture   Final    NO GROWTH 5 DAYS Performed at [facility], [place] [address], [place], [place] [address]    Report Status 08/01/2020 FINAL  Final  Resp Panel by RT-PCR (Flu A&B, Covid) Nasopharyngeal Swab     Status: None   Collection Time: 08/02/20  8:49 AM   Specimen: Nasopharyngeal Swab; Nasopharyngeal(NP) swabs in vial transport medium  Result Value Ref Range Status   SARS Coronavirus 2 by RT PCR NEGATIVE NEGATIVE Final    Comment: (NOTE) SARS-CoV-2 target nucleic acids are NOT DETECTED.  The SARS-CoV-2 RNA is  generally detectable in upper respiratory specimens  during the acute phase of infection. The lowest concentration of SARS-CoV-2 viral copies this assay can detect is 138 copies/mL. A negative result does not preclude SARS-Cov-2 infection and should not be used as the sole basis for treatment or other patient management decisions. A negative result may occur with  improper specimen collection/handling, submission of specimen other than nasopharyngeal swab, presence of viral mutation(s) within the areas targeted by this assay, and inadequate number of viral copies(<138 copies/mL). A negative result must be combined with clinical observations, patient history, and epidemiological information. The expected result is Negative.  Fact Sheet for Patients:  EntrepreneurPulse.com.au  Fact Sheet for Healthcare Providers:  IncredibleEmployment.be  This test is no t yet approved or cleared by the [place] FDA and  has been authorized for detection and/or diagnosis of SARS-CoV-2 by FDA under an Emergency Use Authorization (EUA). This EUA will remain  in effect (meaning this test can be used) for the duration of the COVID-19 declaration under Section 564(b)(1) of the Act, 21 U.S.C.section 360bbb-3(b)(1), unless the authorization is terminated  or revoked sooner.       Influenza A by PCR NEGATIVE NEGATIVE Final   Influenza B by PCR NEGATIVE NEGATIVE Final    Comment: (NOTE) The Xpert Xpress SARS-CoV-2/FLU/RSV plus assay is intended as an aid in the diagnosis of influenza from Nasopharyngeal swab specimens and should not be used as a sole basis for treatment. Nasal washings and aspirates are unacceptable for Xpert Xpress SARS-CoV-2/FLU/RSV testing.  Fact Sheet for Patients: EntrepreneurPulse.com.au  Fact Sheet for Healthcare Providers: IncredibleEmployment.be  This test is not yet approved or cleared by the [place] FDA and has been authorized for detection  and/or diagnosis of SARS-CoV-2 by FDA under an Emergency Use Authorization (EUA). This EUA will remain in effect (meaning this test can be used) for the duration of the COVID-19 declaration under Section 564(b)(1) of the Act, 21 U.S.C. section 360bbb-3(b)(1), unless the authorization is terminated or revoked.  Performed at [facility], [place] [address] [place], [place] [address]      Labs: BNP (last 3 results) Recent Labs    01/12/20 0955 07/22/20 1502 07/23/20 0409  BNP 114.3* 129.2* 422.0*    Basic Metabolic Panel: Recent Labs  Lab 07/29/20 0116 07/30/20 0101 08/03/20 0556  NA 134* 134*  --   K 4.4 4.5  --   CL 101 102  --   CO2 27 28  --   GLUCOSE 101* 103*  --   BUN 6* 8  --   CREATININE 0.66 0.55 0.63  CALCIUM 8.3* 8.6*  --   MG  --  2.0  --     Liver Function Tests: No results for input(s): AST, ALT, ALKPHOS, BILITOT, PROT, ALBUMIN in the last 168 hours.  No results for input(s): LIPASE, AMYLASE in the last 168 hours. No results for input(s): AMMONIA in the last 168 hours. CBC: Recent Labs  Lab 07/29/20 0116 07/30/20 0101  WBC 7.3 8.5  HGB 9.5* 10.8*  HCT 29.7* 33.1*  MCV 90.8 90.9  PLT 265 329    Cardiac Enzymes: No results for input(s): CKTOTAL, CKMB, CKMBINDEX, TROPONINI in the last 168 hours. BNP: Invalid input(s): POCBNP CBG: No results for input(s): GLUCAP in the last 168 hours. D-Dimer No results for input(s): DDIMER in the last 72 hours. Hgb A1c No results for input(s): HGBA1C in the last 72 hours. Lipid Profile No results for input(s): CHOL, HDL, LDLCALC, TRIG,  CHOLHDL, LDLDIRECT in the last 72 hours. Thyroid function studies No results for input(s): TSH, T4TOTAL, T3FREE, THYROIDAB in the last 72 hours.  Invalid input(s): FREET3 Anemia work up No results for input(s): VITAMINB12, FOLATE, FERRITIN, TIBC, IRON, RETICCTPCT in the last 72 hours. Urinalysis    Component Value Date/Time   COLORURINE YELLOW 07/29/2020 1736    APPEARANCEUR HAZY (A) 07/29/2020 1736   LABSPEC 1.010 07/29/2020 1736   PHURINE 8.0 07/29/2020 1736   GLUCOSEU NEGATIVE 07/29/2020 1736   HGBUR SMALL (A) 07/29/2020 1736   BILIRUBINUR NEGATIVE 07/29/2020 1736   KETONESUR NEGATIVE 07/29/2020 1736   PROTEINUR NEGATIVE 07/29/2020 1736   UROBILINOGEN 0.2 08/25/2014 0454   NITRITE NEGATIVE 07/29/2020 1736   LEUKOCYTESUR NEGATIVE 07/29/2020 1736   Sepsis Labs Invalid input(s): PROCALCITONIN,  WBC,  LACTICIDVEN Microbiology Recent Results (from the past 240 hour(s))  Resp Panel by RT-PCR (Flu A&B, Covid) Nasopharyngeal Swab     Status: None   Collection Time: 07/25/20 11:22 AM   Specimen: Nasopharyngeal Swab; Nasopharyngeal(NP) swabs in vial transport medium  Result Value Ref Range Status   SARS Coronavirus 2 by RT PCR NEGATIVE NEGATIVE Final    Comment: (NOTE) SARS-CoV-2 target nucleic acids are NOT DETECTED.  The SARS-CoV-2 RNA is generally detectable in upper respiratory specimens during the acute phase of infection. The lowest concentration of SARS-CoV-2 viral copies this assay can detect is 138 copies/mL. A negative result does not preclude SARS-Cov-2 infection and should not be used as the sole basis for treatment or other patient management decisions. A negative result may occur with  improper specimen collection/handling, submission of specimen other than nasopharyngeal swab, presence of viral mutation(s) within the areas targeted by this assay, and inadequate number of viral copies(<138 copies/mL). A negative result must be combined with clinical observations, patient history, and epidemiological information. The expected result is Negative.  Fact Sheet for Patients:  EntrepreneurPulse.com.au  Fact Sheet for Healthcare Providers:  IncredibleEmployment.be  This test is no t yet approved or cleared by the [place] FDA and  has been authorized for detection and/or diagnosis of  SARS-CoV-2 by FDA under an Emergency Use Authorization (EUA). This EUA will remain  in effect (meaning this test can be used) for the duration of the COVID-19 declaration under Section 564(b)(1) of the Act, 21 U.S.C.section 360bbb-3(b)(1), unless the authorization is terminated  or revoked sooner.       Influenza A by PCR NEGATIVE NEGATIVE Final   Influenza B by PCR NEGATIVE NEGATIVE Final    Comment: (NOTE) The Xpert Xpress SARS-CoV-2/FLU/RSV plus assay is intended as an aid in the diagnosis of influenza from Nasopharyngeal swab specimens and should not be used as a sole basis for treatment. Nasal washings and aspirates are unacceptable for Xpert Xpress SARS-CoV-2/FLU/RSV testing.  Fact Sheet for Patients: EntrepreneurPulse.com.au  Fact Sheet for Healthcare Providers: IncredibleEmployment.be  This test is not yet approved or cleared by the [place] FDA and has been authorized for detection and/or diagnosis of SARS-CoV-2 by FDA under an Emergency Use Authorization (EUA). This EUA will remain in effect (meaning this test can be used) for the duration of the COVID-19 declaration under Section 564(b)(1) of the Act, 21 U.S.C. section 360bbb-3(b)(1), unless the authorization is terminated or revoked.  Performed at [facility], [place] [address], [place],  [address]   Urine culture     Status: Abnormal   Collection Time: 07/26/20  1:44 PM   Specimen: Urine, Clean Catch  Result Value Ref Range Status  Specimen Description URINE, CLEAN CATCH  Final   Special Requests   Final    NONE Performed at [facility], [place] [address], [place], [place] [address]    Culture MULTIPLE SPECIES PRESENT, SUGGEST RECOLLECTION (A)  Final   Report Status 07/27/2020 FINAL  Final  Resp Panel by RT-PCR (Flu A&B, Covid) Nasopharyngeal Swab     Status: None   Collection Time: 07/27/20  7:36 PM   Specimen: Nasopharyngeal Swab; Nasopharyngeal(NP)  swabs in vial transport medium  Result Value Ref Range Status   SARS Coronavirus 2 by RT PCR NEGATIVE NEGATIVE Final    Comment: (NOTE) SARS-CoV-2 target nucleic acids are NOT DETECTED.  The SARS-CoV-2 RNA is generally detectable in upper respiratory specimens during the acute phase of infection. The lowest concentration of SARS-CoV-2 viral copies this assay can detect is 138 copies/mL. A negative result does not preclude SARS-Cov-2 infection and should not be used as the sole basis for treatment or other patient management decisions. A negative result may occur with  improper specimen collection/handling, submission of specimen other than nasopharyngeal swab, presence of viral mutation(s) within the areas targeted by this assay, and inadequate number of viral copies(<138 copies/mL). A negative result must be combined with clinical observations, patient history, and epidemiological information. The expected result is Negative.  Fact Sheet for Patients:  EntrepreneurPulse.com.au  Fact Sheet for Healthcare Providers:  IncredibleEmployment.be  This test is no t yet approved or cleared by the [place] FDA and  has been authorized for detection and/or diagnosis of SARS-CoV-2 by FDA under an Emergency Use Authorization (EUA). This EUA will remain  in effect (meaning this test can be used) for the duration of the COVID-19 declaration under Section 564(b)(1) of the Act, 21 U.S.C.section 360bbb-3(b)(1), unless the authorization is terminated  or revoked sooner.       Influenza A by PCR NEGATIVE NEGATIVE Final   Influenza B by PCR NEGATIVE NEGATIVE Final    Comment: (NOTE) The Xpert Xpress SARS-CoV-2/FLU/RSV plus assay is intended as an aid in the diagnosis of influenza from Nasopharyngeal swab specimens and should not be used as a sole basis for treatment. Nasal washings and aspirates are unacceptable for Xpert Xpress  SARS-CoV-2/FLU/RSV testing.  Fact Sheet for Patients: EntrepreneurPulse.com.au  Fact Sheet for Healthcare Providers: IncredibleEmployment.be  This test is not yet approved or cleared by the [place] FDA and has been authorized for detection and/or diagnosis of SARS-CoV-2 by FDA under an Emergency Use Authorization (EUA). This EUA will remain in effect (meaning this test can be used) for the duration of the COVID-19 declaration under Section 564(b)(1) of the Act, 21 U.S.C. section 360bbb-3(b)(1), unless the authorization is terminated or revoked.  Performed at [facility], [place] [address], [place], [place] [address]   Culture, blood (routine x 2)     Status: None   Collection Time: 07/27/20  7:58 PM   Specimen: BLOOD  Result Value Ref Range Status   Specimen Description BLOOD RIGHT ANTECUBITAL  Final   Special Requests   Final    BOTTLES DRAWN AEROBIC AND ANAEROBIC Blood Culture results may not be optimal due to an inadequate volume of blood received in culture bottles   Culture   Final    NO GROWTH 5 DAYS Performed at [facility], [place] [address], [place], [place] [address]    Report Status 08/01/2020 FINAL  Final  Culture, blood (routine x 2)     Status: None   Collection  Time: 07/27/20  8:42 PM   Specimen: BLOOD  Result Value Ref Range Status   Specimen Description BLOOD SITE NOT SPECIFIED  Final   Special Requests   Final    BOTTLES DRAWN AEROBIC AND ANAEROBIC Blood Culture results may not be optimal due to an inadequate volume of blood received in culture bottles   Culture   Final    NO GROWTH 5 DAYS Performed at [facility], [place] [address] [place], [place] [address]    Report Status 08/01/2020 FINAL  Final  Resp Panel by RT-PCR (Flu A&B, Covid) Nasopharyngeal Swab     Status: None   Collection Time: 08/02/20  8:49 AM   Specimen: Nasopharyngeal Swab; Nasopharyngeal(NP) swabs in vial transport medium  Result  Value Ref Range Status   SARS Coronavirus 2 by RT PCR NEGATIVE NEGATIVE Final    Comment: (NOTE) SARS-CoV-2 target nucleic acids are NOT DETECTED.  The SARS-CoV-2 RNA is generally detectable in upper respiratory specimens during the acute phase of infection. The lowest concentration of SARS-CoV-2 viral copies this assay can detect is 138 copies/mL. A negative result does not preclude SARS-Cov-2 infection and should not be used as the sole basis for treatment or other patient management decisions. A negative result may occur with  improper specimen collection/handling, submission of specimen other than nasopharyngeal swab, presence of viral mutation(s) within the areas targeted by this assay, and inadequate number of viral copies(<138 copies/mL). A negative result must be combined with clinical observations, patient history, and epidemiological information. The expected result is Negative.  Fact Sheet for Patients:  EntrepreneurPulse.com.au  Fact Sheet for Healthcare Providers:  IncredibleEmployment.be  This test is no t yet approved or cleared by the [place] FDA and  has been authorized for detection and/or diagnosis of SARS-CoV-2 by FDA under an Emergency Use Authorization (EUA). This EUA will remain  in effect (meaning this test can be used) for the duration of the COVID-19 declaration under Section 564(b)(1) of the Act, 21 U.S.C.section 360bbb-3(b)(1), unless the authorization is terminated  or revoked sooner.       Influenza A by PCR NEGATIVE NEGATIVE Final   Influenza B by PCR NEGATIVE NEGATIVE Final    Comment: (NOTE) The Xpert Xpress SARS-CoV-2/FLU/RSV plus assay is intended as an aid in the diagnosis of influenza from Nasopharyngeal swab specimens and should not be used as a sole basis for treatment. Nasal washings and aspirates are unacceptable for Xpert Xpress SARS-CoV-2/FLU/RSV testing.  Fact Sheet for  Patients: EntrepreneurPulse.com.au  Fact Sheet for Healthcare Providers: IncredibleEmployment.be  This test is not yet approved or cleared by the [place] FDA and has been authorized for detection and/or diagnosis of SARS-CoV-2 by FDA under an Emergency Use Authorization (EUA). This EUA will remain in effect (meaning this test can be used) for the duration of the COVID-19 declaration under Section 564(b)(1) of the Act, 21 U.S.C. section 360bbb-3(b)(1), unless the authorization is terminated or revoked.  Performed at [facility], [place] [address] [place], [place] [address]      Time coordinating discharge: Over 30 minutes  SIGNED:   Charlynne Cousins, MD  [facility] 08/04/2020, 8:20 AM Pager   If 7PM-7AM, please contact night-coverage [email or web address] Password [email or web address]

## 2020-08-04 NOTE — Progress Notes (Signed)
Called and gave report to RN at [facility].

## 2020-08-04 NOTE — Telephone Encounter (Signed)
Patients daughter Thersa Salt called she stated she is returning a phone call from [place] call back:[phone]

## 2020-08-04 NOTE — TOC Transition Note (Signed)
Transition of Care [facility]) - CM/SW Discharge Note   Patient Details  Name: Stephanie Donovan MRN: [id number] Date of Birth: [birth year]  Transition of Care [facility]) CM/SW Contact:  Emeterio Reeve, LCSW Phone Number: 08/04/2020, 2:31 PM   Clinical Narrative:     Patient will DC to: [place] Anticipated DC date: 08/04/20  Family notified: [place] Transport by: Corey Harold     Per MD patient ready for DC to [facility]. RN, patient, patient's family, and facility notified of DC. Discharge Summary and FL2 sent to facilityDC packet on chart. Ambulance transport requested for patient.    RN to call report to [phone].  CSW will sign off for now as social work intervention is no longer needed. Please consult [place] again if new needs arise.   Final next level of care: Skilled Nursing Facility Barriers to Discharge: Continued Medical Work up   Patient Goals and CMS Choice Patient states their goals for this hospitalization and ongoing recovery are:: Pt is unable to participate in goal setting due to disorientation. CMS Medicare.gov Compare Post Acute Care list provided to:: Patient Represenative (must comment) (Daughter, [place]) Choice offered to / list presented to : Patient, Adult Children  Discharge Placement              Patient chooses bed at: [facility] Patient to be transferred to facility by: ptar Name of family member notified: daughter Patient and family notified of of transfer: 08/04/20  Discharge Plan and Services     Post Acute Care Choice: [facility]                               Social Determinants of Health (SDOH) Interventions     Readmission Risk Interventions No flowsheet data found.   Emeterio Reeve, Latanya Presser, [place] Social Worker [phone]

## 2020-08-09 ENCOUNTER — Encounter: Payer: Self-pay | Admitting: Orthopaedic Surgery

## 2020-08-09 ENCOUNTER — Ambulatory Visit (INDEPENDENT_AMBULATORY_CARE_PROVIDER_SITE_OTHER): Payer: Medicare Other | Admitting: Orthopaedic Surgery

## 2020-08-09 ENCOUNTER — Telehealth: Payer: Self-pay

## 2020-08-09 DIAGNOSIS — T84498A Other mechanical complication of other internal orthopedic devices, implants and grafts, initial encounter: Secondary | ICD-10-CM

## 2020-08-09 DIAGNOSIS — S72401K Unspecified fracture of lower end of right femur, subsequent encounter for closed fracture with nonunion: Secondary | ICD-10-CM

## 2020-08-09 NOTE — Telephone Encounter (Signed)
No weight bearing and no ROM

## 2020-08-09 NOTE — Telephone Encounter (Signed)
Stephanie Donovan from [facility] called she is requesting clarification regarding range of motion and clarification regarding weight that can be put on leg Fax:[phone] call back:[phone]

## 2020-08-09 NOTE — Progress Notes (Signed)
Office Visit Note   Patient: Stephanie Donovan           Date of Birth: 1927/06/10           MRN: [id number] Visit Date: 08/09/2020              Requested by: No referring provider defined for this encounter. PCP: System, Provider Not In   Assessment & Plan: Visit Diagnoses:  1. 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 Exposed orthopaedic hardware [facility])     Plan: I had a very long discussion with the patient and her daughter about their wishes.  This is a very complex problem that they are presented with.  Unfortunately with the exposed hardware and presumed chronic infection she is not a candidate for distal femur replacement.  Given that their wishes are for improved quality of life I feel that the best option is for an above-the-knee amputation so that she can be more comfortable and more easily transferred and cared for at her facility.  I do not believe that just removing the hardware would add much benefit to her quality of life and independence.  The daughter is very helpful that the patient will be able to attend the daughter's wedding in a few weeks after which time they will likely make an appointment to see Dr. Sharol Given to talk about an AKA.  Total face to face encounter time was greater than 25 minutes and over half of this time was spent in counseling and/or coordination of care.  Follow-Up Instructions: Return if symptoms worsen or fail to improve.   Orders:  No orders of the defined types were placed in this encounter.  No orders of the defined types were placed in this encounter.     Procedures: No procedures performed   Clinical Data: No additional findings.   Subjective: Chief Complaint  Patient presents with   Right Knee - Pain    Stephanie Donovan is a 87 year old female who is a permanent resident at [facility] brought in by her daughter today for evaluation of chronic exposed  hardware from distal femur fracture that was surgically repaired about 2 years ago by Dr. Ninfa Linden.  Unfortunately the fracture went on to a nonunion and she had catastrophic hardware failure and the distal end of the plate has now eroded through the anterior skin.  She reports no pain unless the leg is moved.  She does not ambulate without a wheelchair.  She did see Dr. Sharol Given in consultation at the hospital who recommended an above-the-knee amputation but they decided against it.  She is currently on doxycycline for suppression.   Review of Systems  Constitutional: Negative.   HENT: Negative.    Eyes: Negative.   Respiratory: Negative.    Cardiovascular: Negative.   Endocrine: Negative.   Musculoskeletal: Negative.   Neurological: Negative.   Hematological: Negative.   Psychiatric/Behavioral: Negative.    All other systems reviewed and are negative.   Objective: Vital Signs: There were no vitals taken for this visit.  Physical Exam Vitals and nursing note reviewed.  Constitutional:      Appearance: She is well-developed.  Pulmonary:     Effort: Pulmonary effort is normal.  Skin:    General: Skin is warm.     Capillary Refill: Capillary refill takes less than 2 seconds.  Neurological:     Mental Status: She is alert and oriented to person, place, and time.  Psychiatric:        Behavior: Behavior normal.        Thought Content: Thought content normal.        Judgment: Judgment normal.    Ortho Exam Right lower extremity shows exposed hardware through the anterior skin of the knee.  There is serous drainage through this open wound which is approximately 2 cm in diameter.  There is gross motion through the fracture site with pain.  No neurovascular compromise to the extremity. Specialty Comments:  No specialty comments available.  Imaging: No results found.   PMFS History: Patient Active Problem List   Diagnosis Date Noted   Hardware complicating wound infection ([place])     Cellulitis 07/27/2020   Supracondylar fracture of right femur, closed, initial encounter ([place]) 01/01/2018   Closed fracture of right distal femur ([place]) 12/24/2017   Acute blood loss anemia 12/19/2017   Malnutrition of moderate degree 12/18/2017   Femur fracture ([place]) 12/17/2017   Fall 12/17/2017   HLD (hyperlipidemia) 12/17/2017   Chronic anemia 12/17/2017   Closed 4-part fracture of proximal humerus, right, initial encounter 10/09/2017   Paroxysmal A-fib ([place]) 09/21/2017   Closed displaced fracture of right femoral neck ([place]) 09/21/2017   Closed fracture of right proximal humerus 09/21/2017   Dehydration 09/21/2017   Hyponatremia 09/21/2017   Elevated CK 09/21/2017   Thyroid nodule 09/21/2017   Closed right hip fracture ([place]) 09/21/2017   Chronic diastolic CHF (congestive heart failure) ([place]) 09/21/2017   Sinus tachycardia 09/21/2017   Acute lower UTI 09/21/2017   TIA (transient ischemic attack) 08/25/2014   Aphasia 08/25/2014   Chronic constipation 05/19/2014   Fracture of multiple pubic rami ([place]) 04/11/2014   Pubic bone fracture ([place]) 04/11/2014   Left hip pain 04/11/2014   Atrial fibrillation with RVR ([place]) 03/28/2013   Diarrhea 03/28/2013   Hypothyroidism 03/24/2013   Syncope 03/24/2013   Glaucoma 03/24/2013   Hypokalemia 03/24/2013   Acute gastroenteritis 03/24/2013   Past Medical History:  Diagnosis Date   Blood transfusion without reported diagnosis    Chronic back pain    Chronic constipation    Dysrhythmia    Afib with RVR - 2015, Oaroxyomal Afib   GI bleed    Glaucoma    Syncope 2015   Thyroid disease    TIA (transient ischemic attack) 2015    Family History  Problem Relation Age of Onset   Osteoporosis Mother    Cancer - Colon Father    Schizophrenia Son     Past Surgical History:  Procedure Laterality Date   CATARACT EXTRACTION, BILATERAL     COLONOSCOPY     HIP ARTHROPLASTY Right 09/22/2017   Procedure: ARTHROPLASTY BIPOLAR HIP  (HEMIARTHROPLASTY);  Surgeon: [name], MD;  Location: [facility];  Service: Orthopedics;  Laterality: Right;   JOINT REPLACEMENT  11/20/2011   LTKR   ORIF FEMUR FRACTURE Right 12/20/2017   Procedure: OPEN REDUCTION INTERNAL FIXATION (ORIF) DISTAL FEMUR FRACTURE;  Surgeon: [name], MD;  Location: [facility];  Service: Orthopedics;  Laterality: Right;   REVERSE SHOULDER ARTHROPLASTY Right 10/09/2017   REVERSE SHOULDER ARTHROPLASTY Right 10/09/2017   Procedure: REVERSE SHOULDER ARTHROPLASTY;  Surgeon: [name], MD;  Location: [facility];  Service: Orthopedics;  Laterality: Right;   Social History   Occupational History   Not on file  Tobacco Use   Smoking status: Never   Smokeless tobacco: Never  Vaping Use   Vaping Use: Never used  Substance and Sexual Activity  Alcohol use: No   Drug use: No   Sexual activity: Not Currently    Birth control/protection: Post-menopausal

## 2020-08-10 NOTE — Telephone Encounter (Signed)
FAXED ORDER TO THEM. [phone]

## 2020-08-10 NOTE — Telephone Encounter (Signed)
Dr. Erlinda Hong saw this pt for second opinion yesterday and per note the pt will wait until after her daughters wedding to make appt with Dr. Sharol Given to discuss amputation

## 2020-09-20 ENCOUNTER — Ambulatory Visit: Payer: Medicare Other | Admitting: Orthopedic Surgery

## 2020-10-03 ENCOUNTER — Other Ambulatory Visit: Payer: Self-pay

## 2020-10-03 ENCOUNTER — Encounter: Payer: Self-pay | Admitting: Orthopedic Surgery

## 2020-10-03 ENCOUNTER — Other Ambulatory Visit: Payer: Self-pay | Admitting: Physician Assistant

## 2020-10-03 ENCOUNTER — Ambulatory Visit (INDEPENDENT_AMBULATORY_CARE_PROVIDER_SITE_OTHER): Payer: Medicare Other | Admitting: Orthopedic Surgery

## 2020-10-03 DIAGNOSIS — S72401K Unspecified fracture of lower end of right femur, subsequent encounter for closed fracture with nonunion: Secondary | ICD-10-CM | POA: Diagnosis not present

## 2020-10-03 DIAGNOSIS — T84498A Other mechanical complication of other internal orthopedic devices, implants and grafts, initial encounter: Secondary | ICD-10-CM | POA: Diagnosis not present

## 2020-10-04 ENCOUNTER — Encounter: Payer: Self-pay | Admitting: Orthopedic Surgery

## 2020-10-04 NOTE — Progress Notes (Signed)
Office Visit Note   Patient: Stephanie Donovan           Date of Birth: [birth year]           MRN: [id number] Visit Date: 10/03/2020              Requested by: No referring provider defined for this encounter. PCP: System, Provider Not In  Chief Complaint  Patient presents with   Right Leg - Pain      HPI: Patient is a 87 year old woman who presents in follow-up for open reduction internal fixation distal femur fracture on the right.  Patient states she has had increasing pain and increasing exposure of the hardware at the knee.  Patient is seen with her daughter present for the exam.  Assessment & Plan: Visit Diagnoses:  1. 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 Exposed orthopaedic hardware [facility])     Plan: With the patient's increased pain and increased exposure of the hardware I have recommended proceeding with an above-the-knee amputation.  Risks and benefits were discussed including risk of the wound not healing.  I feel this should be the best option to minimize risk of infection and to decrease pain.  Patient and daughter state they understand and wish to proceed at this time.  Follow-Up Instructions: Return in about 2 weeks (around 10/17/2020).   Ortho Exam  Patient is alert, oriented, no adenopathy, well-dressed, normal affect, normal respiratory effort. Examination the entire distal aspect of the supracondylar femoral plate is exposed.  The skin is thin and atrophic there is deformity at the knee.  There is no ascending cellulitis no purulent drainage.  Imaging: No results found. No images are attached to the encounter.  Labs: Lab Results  Component Value Date   HGBA1C 5.5 08/26/2014   ESRSEDRATE 34 (H) 12/23/2011   CRP 0.7 (H) 04/14/2014   REPTSTATUS 08/01/2020 FINAL 07/27/2020   CULT  07/27/2020    NO GROWTH 5 DAYS Performed at [facility], [place] [address]  [place], [place] [address]    LABORGA ESCHERICHIA COLI (A) 07/22/2020     Lab Results  Component Value Date   ALBUMIN 2.6 (L) 07/28/2020   ALBUMIN 2.5 (L) 07/26/2020   ALBUMIN 2.8 (L) 07/23/2020    Lab Results  Component Value Date   MG 2.0 07/30/2020   MG 1.9 07/28/2020   MG 1.8 07/25/2020   Lab Results  Component Value Date   VD25OH 47.0 09/22/2017    No results found for: PREALBUMIN CBC EXTENDED Latest Ref Rng & Units 07/30/2020 07/29/2020 07/28/2020  WBC 4.0 - 10.5 K/uL 8.5 7.3 7.3  RBC 3.87 - 5.11 MIL/uL 3.64(L) 3.27(L) 3.65(L)  HGB 12.0 - 15.0 g/dL 10.8(L) 9.5(L) 10.6(L)  HCT 36.0 - 46.0 % 33.1(L) 29.7(L) 33.1(L)  PLT 150 - 400 K/uL 329 265 259  NEUTROABS 1.7 - 7.7 K/uL - - -  LYMPHSABS 0.7 - 4.0 K/uL - - -     There is no height or weight on file to calculate BMI.  Orders:  No orders of the defined types were placed in this encounter.  No orders of the defined types were placed in this encounter.    Procedures: No procedures performed  Clinical Data: No additional findings.  ROS:  All other systems negative, except as noted in the HPI. Review of Systems  Objective: Vital Signs: There were no vitals taken for this visit.  Specialty  Comments:  No specialty comments available.  PMFS History: Patient Active Problem List   Diagnosis Date Noted   Hardware complicating wound infection ([place])    Cellulitis 07/27/2020   Supracondylar fracture of right femur, closed, initial encounter ([place]) 01/01/2018   Closed fracture of right distal femur ([place]) 12/24/2017   Acute blood loss anemia 12/19/2017   Malnutrition of moderate degree 12/18/2017   Femur fracture ([place]) 12/17/2017   Fall 12/17/2017   HLD (hyperlipidemia) 12/17/2017   Chronic anemia 12/17/2017   Closed 4-part fracture of proximal humerus, right, initial encounter 10/09/2017   Paroxysmal A-fib ([place]) 09/21/2017   Closed displaced fracture of right femoral neck ([place]) 09/21/2017   Closed fracture of  right proximal humerus 09/21/2017   Dehydration 09/21/2017   Hyponatremia 09/21/2017   Elevated CK 09/21/2017   Thyroid nodule 09/21/2017   Closed right hip fracture ([place]) 09/21/2017   Chronic diastolic CHF (congestive heart failure) ([place]) 09/21/2017   Sinus tachycardia 09/21/2017   Acute lower UTI 09/21/2017   TIA (transient ischemic attack) 08/25/2014   Aphasia 08/25/2014   Chronic constipation 05/19/2014   Fracture of multiple pubic rami ([place]) 04/11/2014   Pubic bone fracture ([place]) 04/11/2014   Left hip pain 04/11/2014   Atrial fibrillation with RVR ([place]) 03/28/2013   Diarrhea 03/28/2013   Hypothyroidism 03/24/2013   Syncope 03/24/2013   Glaucoma 03/24/2013   Hypokalemia 03/24/2013   Acute gastroenteritis 03/24/2013   Past Medical History:  Diagnosis Date   Blood transfusion without reported diagnosis    Chronic back pain    Chronic constipation    Dysrhythmia    Afib with RVR - 2015, Oaroxyomal Afib   GI bleed    Glaucoma    Syncope 2015   Thyroid disease    TIA (transient ischemic attack) 2015    Family History  Problem Relation Age of Onset   Osteoporosis Mother    Cancer - Colon Father    Schizophrenia Son     Past Surgical History:  Procedure Laterality Date   CATARACT EXTRACTION, BILATERAL     COLONOSCOPY     HIP ARTHROPLASTY Right 09/22/2017   Procedure: ARTHROPLASTY BIPOLAR HIP (HEMIARTHROPLASTY);  Surgeon: [name], MD;  Location: [place];  Service: Orthopedics;  Laterality: Right;   JOINT REPLACEMENT  11/20/2011   LTKR   ORIF FEMUR FRACTURE Right 12/20/2017   Procedure: OPEN REDUCTION INTERNAL FIXATION (ORIF) DISTAL FEMUR FRACTURE;  Surgeon: [name], MD;  Location: [place];  Service: Orthopedics;  Laterality: Right;   REVERSE SHOULDER ARTHROPLASTY Right 10/09/2017   REVERSE SHOULDER ARTHROPLASTY Right 10/09/2017   Procedure: REVERSE SHOULDER ARTHROPLASTY;  Surgeon: [name], MD;  Location: [place];  Service: Orthopedics;  Laterality:  Right;   Social History   Occupational History   Not on file  Tobacco Use   Smoking status: Never   Smokeless tobacco: Never  Vaping Use   Vaping Use: Never used  Substance and Sexual Activity   Alcohol use: No   Drug use: No   Sexual activity: Not Currently    Birth control/protection: Post-menopausal

## 2020-10-05 ENCOUNTER — Other Ambulatory Visit: Payer: Self-pay

## 2020-10-05 ENCOUNTER — Encounter (HOSPITAL_COMMUNITY): Payer: Self-pay | Admitting: Orthopedic Surgery

## 2020-10-05 NOTE — Anesthesia Preprocedure Evaluation (Addendum)
Anesthesia Evaluation  Patient identified by MRN, date of birth, ID band Patient awake    Reviewed: Allergy & Precautions, NPO status , Patient's Chart, lab work & pertinent test results  Airway Mallampati: II  TM Distance: >3 FB Neck ROM: Full    Dental no notable dental hx. (+) Partial Upper, Partial Lower, Poor Dentition, Missing, Dental Advisory Given   Pulmonary neg pulmonary ROS,    Pulmonary exam normal breath sounds clear to auscultation       Cardiovascular hypertension, Pt. on medications +CHF  Normal cardiovascular exam+ dysrhythmias  Rhythm:Regular Rate:Normal  EKG: 07/26/20: Sinus rhythm Atrial premature complexes Low voltage, precordial leads RSR' in V1 or V2, probably normal variant Borderline prolonged QT interval (QT 398, QTc 492 ms)  Echo 07/23/20: IMPRESSIONS  1. Left ventricular ejection fraction, by estimation, is 60 to 65%. The  left ventricle has normal function. The left ventricle has no regional  wall motion abnormalities. Left ventricular diastolic parameters are  consistent with Grade I diastolic  dysfunction (impaired relaxation).  2. Right ventricular systolic function is normal. The right ventricular  size is normal. There is mildly elevated pulmonary artery systolic  pressure.  3. The mitral valve is normal in structure. Mild mitral valve  regurgitation. No evidence of mitral stenosis.  4. The aortic valve is tricuspid. Aortic valve regurgitation is not  visualized. Mild aortic valve sclerosis is present, with no evidence of  aortic valve stenosis.  5. The inferior vena cava is normal in size with greater than 50%  respiratory variability, suggesting right atrial pressure of 3 mmHg.    Neuro/Psych US Carotid 08/26/14: Summary:  - Right - No evidence of ICA stenosis. Vertebral artery flow is  antegrade.  - Left 1% to 39% ICA stenosis lower end of range. Vertebral artery  flow is  antegrade.  TIACVA negative neurological ROS  negative psych ROS   GI/Hepatic negative GI ROS, Neg liver ROS,   Endo/Other  Hypothyroidism   Renal/GU negative Renal ROS  negative genitourinary   Musculoskeletal negative musculoskeletal ROS (+)   Abdominal   Peds negative pediatric ROS (+)  Hematology negative hematology ROS (+) anemia ,   Anesthesia Other Findings   Reproductive/Obstetrics negative OB ROS                           Anesthesia Physical Anesthesia Plan  ASA: 4  Anesthesia Plan: General   Post-op Pain Management:    Induction: Intravenous  PONV Risk Score and Plan: 3 and Ondansetron and Treatment may vary due to age or medical condition  Airway Management Planned: LMA and Oral ETT  Additional Equipment: None  Intra-op Plan:   Post-operative Plan: Extubation in OR  Informed Consent:   Plan Discussed with: CRNA, Surgeon and Anesthesiologist  Anesthesia Plan Comments: (PAT note written 10/05/2020 by Myra Gianotti, PA-C. History includes never smoker, HTN, hypothyroidism, PAF (2015, no anticoag due to GI bleed and anemia), chronic diastolic CHF, CVA (123XX123), cognitive communication deficit, syncope (08/2014; 11/2019 & 12/2019 likely due to orthostatic hypotension with evaluations at [facility]; 07/2020 in setting hypotension SBP 70's, + UTI, admitted [place]), chronic back pain, glaucoma.  Last cardiology visit with Dr. Geanie Berlin [facility]) was on 04/26/20 for PAF and recurrent syncope. [place] records reviewed. Syncope felt likely due to hypotension. B-blocker dose previously decreased. Advised increase water intake and continue low dose Lasix. 30 day event monitor ordered and placed 06/14/20. Results not up in  Care Everywhere, will request report. Echo on 07/23/20 showed LVEF 60-65%, no regional wall motion abnormalities, grade 1 DD, normal RVSF, mildly elevated RASP, mild MR, mild aortic valve sclerosis without  stenosis. 07/22/20 head CT showed atrophy and chronic small vessel ischemic changes but no acute intracranial abnormalities.)      Anesthesia Quick Evaluation

## 2020-10-05 NOTE — Progress Notes (Signed)
Patient lives at [facility] [phone]).  Spoke with  Diane, LPN for PAT information and instructions for DOS.    DUE TO COVID-19 ONLY ONE VISITOR IS ALLOWED TO COME WITH YOU AND STAY IN THE WAITING ROOM ONLY DURING PRE OP AND PROCEDURE DAY OF SURGERY.   Two  VISITORS  MAY VISIT WITH YOU AFTER SURGERY IN YOUR PRIVATE ROOM DURING VISITING HOURS ONLY!  PCP - Dr Jules Husbands Cardiologist - Dr Marina Goodell   Chest x-ray - 07/22/20 EKG - 07/26/20 Stress Test - n/a ECHO - 07/23/20 Cardiac Cath - n/a  ICD Pacemaker/Loop - n/a  Sleep Study -  n/a CPAP - none  Aspirin Instructions: Per Malachy Mood at Dr Jess Barters office, patient is to continue ASA even on DOS.   ERAS: Clear liquids til 6:30 am on DOS.  Anesthesia review: Yes  STOP now taking any Aspirin (unless otherwise instructed by your surgeon), Aleve, Naproxen, Ibuprofen, Motrin, Advil, Goody's, BC's, all herbal medications, fish oil, and all vitamins.   Coronavirus Screening Covid test is scheduled on DOS Do you have any of the following symptoms:  Cough yes/no: No Fever (>100.51F)  yes/no: No Runny nose yes/no: No Sore throat yes/no: No Difficulty breathing/shortness of breath  yes/no: No  Have you traveled in the last 14 days and where? yes/no: No  Diane, LPN verbalized understanding of instructions that were given via phone.  Instructions were faxed to [phone].

## 2020-10-05 NOTE — Progress Notes (Addendum)
Surgical Instructions for Stephanie Donovan.  Day of Surgery is Friday, 10/07/20 at 9:45 AM.   Report to [facility] Main Entrance "A" at 6:45 A.M., then check in with the Admitting office.  Call this number if you have problems the morning of surgery:  [phone]   If you have any questions prior to your surgery date call [phone]: Open Monday-Friday 8am-4pm    Remember:  Do not eat after midnight the night before your surgery  You may drink clear liquids until 6:30 AM on the morning of your surgery.   Clear liquids allowed are: Water, Non-Citrus Juices (without pulp), Carbonated Beverages, Clear Tea, Black Coffee ONLY (NO MILK, CREAM OR POWDERED CREAMER of any kind), and Gatorade    Take these medicines the morning of surgery with A SIP OF WATER: acetaminophen (TYLENOL) brimonidine (ALPHAGAN P) eye drops docusate sodium (COLACE) levothyroxine (SYNTHROID) metoprolol succinate (TOPROL-XL) oxyCODONE (OXY IR/ROXICODONE) timolol (TIMOPTIC) eye drops traMADol-acetaminophen   Aspirin (per MD)  As of today, STOP taking any Aspirin (unless otherwise instructed by your surgeon) Aleve, Naproxen, Ibuprofen, Motrin, Advil, Goody's, BC's, all herbal medications, fish oil, and all vitamins.          Do not wear jewelry or makeup Do not wear lotions, powders, perfumes, or deodorant. Do not shave 48 hours prior to surgery.   Do not bring valuables to the hospital. DO Not wear nail polish, gel polish, artificial nails, or any other type of covering on natural nails including finger and toenails. If patients have artificial nails, gel coating, etc. that need to be removed by a nail salon please have this removed prior to surgery or surgery may need to be canceled/delayed if the surgeon/ anesthesia feels like the patient is unable to be adequately monitored.             [place] is not responsible for any belongings or valuables.  Do NOT Smoke (Tobacco/Vaping)  24 hours prior to your  procedure.  If you use a CPAP at night, you may bring your mask for your overnight stay.   Contacts, glasses, dentures or bridgework may not be worn into surgery, please bring cases for these belongings   For patients admitted to the hospital, discharge time will be determined by your treatment team.    ONLY 1 SUPPORT PERSON MAY BE PRESENT WHILE YOU ARE IN SURGERY. IF YOU ARE TO BE ADMITTED ONCE YOU ARE IN YOUR ROOM YOU WILL BE ALLOWED TWO (2) VISITORS.     Oral Hygiene is also important to reduce your risk of infection.  Remember - BRUSH YOUR TEETH THE MORNING OF SURGERY WITH YOUR REGULAR TOOTHPASTE.

## 2020-10-05 NOTE — Progress Notes (Addendum)
Anesthesia Chart Review: SAME DAY WORK-UP]  Case: [id number] Date/Time: 10/07/20 0930   Procedure: RIGHT ABOVE KNEE AMPUTATION (Right: Knee)   Anesthesia type: Choice   Pre-op diagnosis: Infected Hardware Right Femur   Location: [facility] OR ROOM 06 / [place] OR   Surgeons: Newt Minion, MD       DISCUSSION: Patient is a 87 year old female scheduled for the above procedure. She is s/p ORIF right distal femur fracture 12/20/17. Imaging on 06/23/20 showed interval hardware failure with displacement of the lateral fixating plate from the cortex of the femur in addition to screw fracture and dislodgement of multiple fixating screws. The fixating plate was approaching the skin surface. She initially declined any type of surgery citing her age and fragility. This was despite surgeon concerns about eventual skin breakdown and exposure of hardware. She was placed in a knee immobilizer in attempts to pad and protect the area. Unfortunately, she went on to develop exposure of hardware. She again decline surgery and wished to try antibiotic and local wound care. She had another orthopedic follow-up with Dr. Sharol Given on 10/03/20 and was having increasing pain and increasing exposure of hardware at the knee. Patient and daughter agreeable for her to proceed with right AKA.  History includes never smoker, HTN, hypothyroidism, PAF (2015, no anticoag due to GI bleed and anemia), chronic diastolic CHF, CVA (123XX123), cognitive communication deficit, syncope (08/2014; 11/2019 & 12/2019 likely due to orthostatic hypotension with evaluations at [facility]; 07/2020 in setting hypotension SBP 70's, + UTI, admitted [place]), chronic back pain, glaucoma.  Last cardiology visit with Dr. Geanie [facility]) was on 04/26/20 for PAF and recurrent syncope. [place] records reviewed. Syncope felt likely due to hypotension. B-blocker dose previously decreased. Advised increase water intake and continue low dose Lasix. 30 day event  monitor ordered and placed 06/14/20. Results not up in [place], will request report. Echo on 07/23/20 showed LVEF 60-65%, no regional wall motion abnormalities, grade 1 DD, normal RVSF, mildly elevated RASP, mild MR, mild aortic valve sclerosis without stenosis. 07/22/20 head CT showed atrophy and chronic small vessel ischemic changes but no acute intracranial abnormalities.  She resides at [facility]. She is for labs and anesthesia team evaluation on the day of surgery.    VS: Ht '5\' 4"'$  (1.626 m)   Wt 62.9 kg   BMI 23.80 kg/m  BP Readings from Last 3 Encounters:  08/04/20 116/70  07/26/20 (!) 148/88  07/25/20 96/64   Pulse Readings from Last 3 Encounters:  08/04/20 72  07/26/20 88  07/25/20 95     PROVIDERS: PCP - Dr. Jules Husbands Cardiologist - Dr Marina Goodell    LABS: Labs on the day of surgery as indicated. Currently most recent results include: Lab Results  Component Value Date   WBC 8.5 07/30/2020   HGB 10.8 (L) 07/30/2020   HCT 33.1 (L) 07/30/2020   PLT 329 07/30/2020   GLUCOSE 103 (H) 07/30/2020   ALT 15 07/28/2020   AST 11 (L) 07/28/2020   NA 134 (L) 07/30/2020   K 4.5 07/30/2020   CL 102 07/30/2020   CREATININE 0.63 08/03/2020   BUN 8 07/30/2020   CO2 28 07/30/2020   TSH 1.512 07/22/2020   INR 1.1 07/28/2020    IMAGES: CT Head 07/22/20: IMPRESSION: 1. No CT evidence for acute intracranial abnormality. 2. Atrophy and chronic small vessel ischemic change of the white matter  1V CXR 07/22/20: FINDINGS: The heart size and mediastinal contours are within normal  limits. Both lungs are clear. The visualized skeletal structures are unremarkable. IMPRESSION: No active disease. Aortic Atherosclerosis (ICD10-I70.0).   EKG: 07/26/20: Sinus rhythm Atrial premature complexes Low voltage, precordial leads RSR' in V1 or V2, probably normal variant Borderline prolonged QT interval [QT 398, QTc 492 ms] Confirmed by Nanda Quinton [phone]) on 07/26/2020  12:55:01 PM   CV: Echo 07/23/20: IMPRESSIONS   1. Left ventricular ejection fraction, by estimation, is 60 to 65%. The  left ventricle has normal function. The left ventricle has no regional  wall motion abnormalities. Left ventricular diastolic parameters are  consistent with Grade I diastolic  dysfunction (impaired relaxation).   2. Right ventricular systolic function is normal. The right ventricular  size is normal. There is mildly elevated pulmonary artery systolic  pressure.   3. The mitral valve is normal in structure. Mild mitral valve  regurgitation. No evidence of mitral stenosis.   4. The aortic valve is tricuspid. Aortic valve regurgitation is not  visualized. Mild aortic valve sclerosis is present, with no evidence of  aortic valve stenosis.   5. The inferior vena cava is normal in size with greater than 50%  respiratory variability, suggesting right atrial pressure of 3 mmHg.    30 day cardiac monitor ([place]): Placed 06/14/20. Copy of results requested, but pending.   US Carotid 08/26/14: Summary:  - Right - No evidence of ICA stenosis. Vertebral artery flow is    antegrade.  - Left 1% to 39% ICA stenosis lower end of range. Vertebral artery    flow is antegrade.    Past Medical History:  Diagnosis Date   Anemia    Blood transfusion without reported diagnosis    CHF (congestive heart failure) ([place])    Chronic back pain    Chronic constipation    Cognitive communication deficit    Dysrhythmia 2015   Afib with RVR - 2015, Oaroxyomal Afib, no current meds due do GI Bleed   GI bleed    Glaucoma    HLD (hyperlipidemia)    Hypertension    Hypothyroidism    Stroke ([place]) 08/2014   mini stroke   Syncope 2015   Thyroid disease    TIA (transient ischemic attack) 2015    Past Surgical History:  Procedure Laterality Date   CATARACT EXTRACTION, BILATERAL     COLONOSCOPY     HIP ARTHROPLASTY Right 09/22/2017   Procedure: ARTHROPLASTY BIPOLAR HIP  (HEMIARTHROPLASTY);  Surgeon: [name], MD;  Location: [place];  Service: Orthopedics;  Laterality: Right;   JOINT REPLACEMENT  11/20/2011   LTKR   ORIF FEMUR FRACTURE Right 12/20/2017   Procedure: OPEN REDUCTION INTERNAL FIXATION (ORIF) DISTAL FEMUR FRACTURE;  Surgeon: [name], MD;  Location: [place];  Service: Orthopedics;  Laterality: Right;   REVERSE SHOULDER ARTHROPLASTY Right 10/09/2017   REVERSE SHOULDER ARTHROPLASTY Right 10/09/2017   Procedure: REVERSE SHOULDER ARTHROPLASTY;  Surgeon: [name], MD;  Location: [place];  Service: Orthopedics;  Laterality: Right;    MEDICATIONS: No current facility-administered medications for this encounter.    traMADol-acetaminophen (ULTRACET) 37.5-325 MG tablet   acetaminophen (TYLENOL) 500 MG tablet   aspirin EC 81 MG tablet   brimonidine (ALPHAGAN P) 0.1 % SOLN   brinzolamide (AZOPT) 1 % ophthalmic suspension   docusate sodium (COLACE) 100 MG capsule   doxycycline (VIBRA-TABS) 100 MG tablet   furosemide (LASIX) 20 MG tablet   levothyroxine (SYNTHROID) 50 MCG tablet   magnesium oxide (MAG-OX) 400 (241.3 Mg) MG tablet  metoprolol succinate (TOPROL-XL) 25 MG 24 hr tablet   metoprolol succinate (TOPROL-XL) 25 MG 24 hr tablet   oxyCODONE (OXY IR/ROXICODONE) 5 MG immediate release tablet   timolol (TIMOPTIC) 0.25 % ophthalmic solution    Myra Gianotti, PA-C Surgical Short Stay/Anesthesiology [facility] Phone [phone] [facility] Phone [phone] 10/05/2020 11:47 AM

## 2020-10-07 ENCOUNTER — Encounter (HOSPITAL_COMMUNITY)
Admission: RE | Disposition: A | Discharge status: Skilled Nursing Facility | Payer: Self-pay | Source: Home / Self Care | Attending: Orthopedic Surgery

## 2020-10-07 ENCOUNTER — Inpatient Hospital Stay (HOSPITAL_COMMUNITY): Payer: Medicare Other | Admitting: Vascular Surgery

## 2020-10-07 ENCOUNTER — Inpatient Hospital Stay (HOSPITAL_COMMUNITY)
Admission: RE | Admit: 2020-10-07 | Discharge: 2020-10-10 | DRG: 475 | Disposition: A | Discharge status: Skilled Nursing Facility | Payer: Medicare Other | Attending: Orthopedic Surgery | Admitting: Orthopedic Surgery

## 2020-10-07 ENCOUNTER — Other Ambulatory Visit: Payer: Self-pay

## 2020-10-07 ENCOUNTER — Encounter (HOSPITAL_COMMUNITY): Payer: Self-pay | Admitting: Orthopedic Surgery

## 2020-10-07 DIAGNOSIS — Z96641 Presence of right artificial hip joint: Secondary | ICD-10-CM | POA: Diagnosis present

## 2020-10-07 DIAGNOSIS — E871 Hypo-osmolality and hyponatremia: Secondary | ICD-10-CM | POA: Diagnosis present

## 2020-10-07 DIAGNOSIS — Z20822 Contact with and (suspected) exposure to covid-19: Secondary | ICD-10-CM | POA: Diagnosis present

## 2020-10-07 DIAGNOSIS — Z886 Allergy status to analgesic agent status: Secondary | ICD-10-CM

## 2020-10-07 DIAGNOSIS — Z885 Allergy status to narcotic agent status: Secondary | ICD-10-CM | POA: Diagnosis not present

## 2020-10-07 DIAGNOSIS — Z888 Allergy status to other drugs, medicaments and biological substances status: Secondary | ICD-10-CM | POA: Diagnosis not present

## 2020-10-07 DIAGNOSIS — G8929 Other chronic pain: Secondary | ICD-10-CM | POA: Diagnosis present

## 2020-10-07 DIAGNOSIS — Z96652 Presence of left artificial knee joint: Secondary | ICD-10-CM | POA: Diagnosis present

## 2020-10-07 DIAGNOSIS — I110 Hypertensive heart disease with heart failure: Secondary | ICD-10-CM | POA: Diagnosis present

## 2020-10-07 DIAGNOSIS — Z96611 Presence of right artificial shoulder joint: Secondary | ICD-10-CM | POA: Diagnosis present

## 2020-10-07 DIAGNOSIS — E785 Hyperlipidemia, unspecified: Secondary | ICD-10-CM | POA: Diagnosis present

## 2020-10-07 DIAGNOSIS — I480 Paroxysmal atrial fibrillation: Secondary | ICD-10-CM | POA: Diagnosis present

## 2020-10-07 DIAGNOSIS — Y792 Prosthetic and other implants, materials and accessory orthopedic devices associated with adverse incidents: Secondary | ICD-10-CM | POA: Diagnosis present

## 2020-10-07 DIAGNOSIS — Z880 Allergy status to penicillin: Secondary | ICD-10-CM | POA: Diagnosis not present

## 2020-10-07 DIAGNOSIS — I5032 Chronic diastolic (congestive) heart failure: Secondary | ICD-10-CM | POA: Diagnosis present

## 2020-10-07 DIAGNOSIS — T84620A Infection and inflammatory reaction due to internal fixation device of right femur, initial encounter: Secondary | ICD-10-CM | POA: Diagnosis present

## 2020-10-07 DIAGNOSIS — Z8262 Family history of osteoporosis: Secondary | ICD-10-CM

## 2020-10-07 DIAGNOSIS — E039 Hypothyroidism, unspecified: Secondary | ICD-10-CM | POA: Diagnosis present

## 2020-10-07 DIAGNOSIS — K5909 Other constipation: Secondary | ICD-10-CM | POA: Diagnosis present

## 2020-10-07 DIAGNOSIS — Z79890 Hormone replacement therapy (postmenopausal): Secondary | ICD-10-CM

## 2020-10-07 DIAGNOSIS — D62 Acute posthemorrhagic anemia: Secondary | ICD-10-CM | POA: Diagnosis not present

## 2020-10-07 DIAGNOSIS — S72451K Displaced supracondylar fracture without intracondylar extension of lower end of right femur, subsequent encounter for closed fracture with nonunion: Secondary | ICD-10-CM | POA: Diagnosis present

## 2020-10-07 DIAGNOSIS — M86251 Subacute osteomyelitis, right femur: Secondary | ICD-10-CM | POA: Diagnosis not present

## 2020-10-07 DIAGNOSIS — Z8673 Personal history of transient ischemic attack (TIA), and cerebral infarction without residual deficits: Secondary | ICD-10-CM | POA: Diagnosis not present

## 2020-10-07 DIAGNOSIS — M549 Dorsalgia, unspecified: Secondary | ICD-10-CM | POA: Diagnosis present

## 2020-10-07 DIAGNOSIS — H409 Unspecified glaucoma: Secondary | ICD-10-CM | POA: Diagnosis present

## 2020-10-07 DIAGNOSIS — T847XXS Infection and inflammatory reaction due to other internal orthopedic prosthetic devices, implants and grafts, sequela: Secondary | ICD-10-CM | POA: Diagnosis not present

## 2020-10-07 DIAGNOSIS — S78111A Complete traumatic amputation at level between right hip and knee, initial encounter: Secondary | ICD-10-CM

## 2020-10-07 DIAGNOSIS — Z79899 Other long term (current) drug therapy: Secondary | ICD-10-CM

## 2020-10-07 HISTORY — DX: Anemia, unspecified: D64.9

## 2020-10-07 HISTORY — DX: Hyperlipidemia, unspecified: E78.5

## 2020-10-07 HISTORY — DX: Essential (primary) hypertension: I10

## 2020-10-07 HISTORY — PX: AMPUTATION: SHX166

## 2020-10-07 HISTORY — DX: Cognitive communication deficit: R41.841

## 2020-10-07 HISTORY — DX: Hypothyroidism, unspecified: E03.9

## 2020-10-07 HISTORY — DX: Heart failure, unspecified: I50.9

## 2020-10-07 LAB — POCT I-STAT, CHEM 8
BUN: 27 mg/dL — ABNORMAL HIGH (ref 8–23)
BUN: 21 mg/dL (ref 8–23)
Calcium, Ion: 1.04 mmol/L — ABNORMAL LOW (ref 1.15–1.40)
Calcium, Ion: 0.91 mmol/L — ABNORMAL LOW (ref 1.15–1.40)
Chloride: 97 mmol/L — ABNORMAL LOW (ref 98–111)
Chloride: 100 mmol/L (ref 98–111)
Creatinine, Ser: 0.5 mg/dL (ref 0.44–1.00)
Creatinine, Ser: 0.5 mg/dL (ref 0.44–1.00)
Glucose, Bld: 106 mg/dL — ABNORMAL HIGH (ref 70–99)
Glucose, Bld: 103 mg/dL — ABNORMAL HIGH (ref 70–99)
HCT: 37 % (ref 36.0–46.0)
HCT: 33 % — ABNORMAL LOW (ref 36.0–46.0)
Hemoglobin: 12.6 g/dL (ref 12.0–15.0)
Hemoglobin: 11.2 g/dL — ABNORMAL LOW (ref 12.0–15.0)
Potassium: 6 mmol/L — ABNORMAL HIGH (ref 3.5–5.1)
Potassium: 4.3 mmol/L (ref 3.5–5.1)
Sodium: 134 mmol/L — ABNORMAL LOW (ref 135–145)
Sodium: 132 mmol/L — ABNORMAL LOW (ref 135–145)
TCO2: 31 mmol/L (ref 22–32)
TCO2: 31 mmol/L (ref 22–32)

## 2020-10-07 LAB — SARS CORONAVIRUS 2 BY RT PCR (HOSPITAL ORDER, PERFORMED IN ~~LOC~~ HOSPITAL LAB): SARS Coronavirus 2: NEGATIVE

## 2020-10-07 SURGERY — AMPUTATION, ABOVE KNEE
Anesthesia: General | Site: Knee | Laterality: Right

## 2020-10-07 MED ORDER — CLINDAMYCIN PHOSPHATE 900 MG/50ML IV SOLN: 900.0000 mg | INTRAVENOUS | Status: DC

## 2020-10-07 MED ORDER — PRO-STAT PO LIQD: 30.0000 mL | Freq: Three times a day (TID) | ORAL | Status: DC

## 2020-10-07 MED ORDER — DOCUSATE SODIUM 100 MG PO CAPS
100.0000 mg | ORAL_CAPSULE | Freq: Every day | ORAL | Status: DC
  Administered 2020-10-08 – 2020-10-10 (×3): 100 mg via ORAL
  Filled 2020-10-07 (×3): qty 1

## 2020-10-07 MED ORDER — POTASSIUM CHLORIDE CRYS ER 20 MEQ PO TBCR: 20.0000 meq | EXTENDED_RELEASE_TABLET | Freq: Every day | ORAL | Status: DC | PRN

## 2020-10-07 MED ORDER — ONDANSETRON HCL 4 MG/2ML IJ SOLN
INTRAMUSCULAR | Status: DC | PRN
  Administered 2020-10-07: 4 mg via INTRAVENOUS

## 2020-10-07 MED ORDER — EPHEDRINE SULFATE-NACL 50-0.9 MG/10ML-% IV SOSY
PREFILLED_SYRINGE | INTRAVENOUS | Status: DC | PRN
  Administered 2020-10-07: 5 mg via INTRAVENOUS

## 2020-10-07 MED ORDER — HYDROMORPHONE HCL 1 MG/ML IJ SOLN
0.5000 mg | INTRAMUSCULAR | Status: DC | PRN
  Administered 2020-10-07: 0.5 mg via INTRAVENOUS

## 2020-10-07 MED ORDER — PHENYLEPHRINE 40 MCG/ML (10ML) SYRINGE FOR IV PUSH (FOR BLOOD PRESSURE SUPPORT)
PREFILLED_SYRINGE | INTRAVENOUS | Status: AC
  Filled 2020-10-07: qty 40

## 2020-10-07 MED ORDER — FENTANYL CITRATE (PF) 100 MCG/2ML IJ SOLN
INTRAMUSCULAR | Status: AC
  Filled 2020-10-07: qty 2

## 2020-10-07 MED ORDER — MAGNESIUM CITRATE PO SOLN
1.0000 | Freq: Once | ORAL | Status: DC | PRN
  Filled 2020-10-07: qty 296

## 2020-10-07 MED ORDER — ACETAMINOPHEN 500 MG PO TABS
1000.0000 mg | ORAL_TABLET | Freq: Two times a day (BID) | ORAL | Status: DC
  Administered 2020-10-07 – 2020-10-10 (×6): 1000 mg via ORAL
  Filled 2020-10-07 (×6): qty 2

## 2020-10-07 MED ORDER — 0.9 % SODIUM CHLORIDE (POUR BTL) OPTIME
TOPICAL | Status: DC | PRN
  Administered 2020-10-07: 1000 mL

## 2020-10-07 MED ORDER — FENTANYL CITRATE (PF) 100 MCG/2ML IJ SOLN
INTRAMUSCULAR | Status: DC | PRN
  Administered 2020-10-07 (×2): 25 ug via INTRAVENOUS

## 2020-10-07 MED ORDER — ONDANSETRON HCL 4 MG/2ML IJ SOLN
INTRAMUSCULAR | Status: AC
  Filled 2020-10-07: qty 2

## 2020-10-07 MED ORDER — PROPOFOL 10 MG/ML IV BOLUS
INTRAVENOUS | Status: DC | PRN
Start: 2020-10-07 — End: 2020-10-07
  Administered 2020-10-07: 80 mg via INTRAVENOUS

## 2020-10-07 MED ORDER — CEFAZOLIN SODIUM-DEXTROSE 2-3 GM-%(50ML) IV SOLR
INTRAVENOUS | Status: DC | PRN
  Administered 2020-10-07: 2 g via INTRAVENOUS

## 2020-10-07 MED ORDER — ASCORBIC ACID 500 MG PO TABS
1000.0000 mg | ORAL_TABLET | Freq: Every day | ORAL | Status: DC
  Administered 2020-10-07 – 2020-10-10 (×4): 1000 mg via ORAL
  Filled 2020-10-07 (×4): qty 2

## 2020-10-07 MED ORDER — ONDANSETRON HCL 4 MG/2ML IJ SOLN
4.0000 mg | Freq: Once | INTRAMUSCULAR | Status: AC | PRN
  Administered 2020-10-07: 4 mg via INTRAVENOUS

## 2020-10-07 MED ORDER — LEVOTHYROXINE SODIUM 50 MCG PO TABS
50.0000 ug | ORAL_TABLET | Freq: Every day | ORAL | Status: DC
  Administered 2020-10-08 – 2020-10-10 (×3): 50 ug via ORAL
  Filled 2020-10-07 (×3): qty 1

## 2020-10-07 MED ORDER — CLINDAMYCIN PHOSPHATE 900 MG/50ML IV SOLN
900.0000 mg | INTRAVENOUS | Status: DC
  Filled 2020-10-07: qty 50

## 2020-10-07 MED ORDER — BISACODYL 5 MG PO TBEC: 5.0000 mg | DELAYED_RELEASE_TABLET | Freq: Every day | ORAL | Status: DC | PRN

## 2020-10-07 MED ORDER — OXYCODONE HCL 5 MG PO TABS
5.0000 mg | ORAL_TABLET | ORAL | Status: DC | PRN
  Administered 2020-10-07: 5 mg via ORAL
  Filled 2020-10-07: qty 1

## 2020-10-07 MED ORDER — SODIUM CHLORIDE 0.9 % IV SOLN: INTRAVENOUS | Status: DC

## 2020-10-07 MED ORDER — CHLORHEXIDINE GLUCONATE 0.12 % MT SOLN
15.0000 mL | Freq: Once | OROMUCOSAL | Status: AC
  Administered 2020-10-07: 15 mL via OROMUCOSAL
  Filled 2020-10-07: qty 15

## 2020-10-07 MED ORDER — FUROSEMIDE 20 MG PO TABS
20.0000 mg | ORAL_TABLET | Freq: Every day | ORAL | Status: DC
  Administered 2020-10-07 – 2020-10-09 (×3): 20 mg via ORAL
  Filled 2020-10-07 (×3): qty 1

## 2020-10-07 MED ORDER — POLYETHYLENE GLYCOL 3350 17 G PO PACK: 17.0000 g | PACK | Freq: Every day | ORAL | Status: DC | PRN

## 2020-10-07 MED ORDER — PHENYLEPHRINE 40 MCG/ML (10ML) SYRINGE FOR IV PUSH (FOR BLOOD PRESSURE SUPPORT)
PREFILLED_SYRINGE | INTRAVENOUS | Status: DC | PRN
  Administered 2020-10-07 (×4): 80 ug via INTRAVENOUS
  Administered 2020-10-07: 40 ug via INTRAVENOUS
  Administered 2020-10-07: 80 ug via INTRAVENOUS

## 2020-10-07 MED ORDER — MEPERIDINE HCL 25 MG/ML IJ SOLN: 6.2500 mg | INTRAMUSCULAR | Status: DC | PRN

## 2020-10-07 MED ORDER — ACETAMINOPHEN 160 MG/5ML PO SOLN: 325.0000 mg | ORAL | Status: DC | PRN

## 2020-10-07 MED ORDER — ORAL CARE MOUTH RINSE: 15.0000 mL | Freq: Once | OROMUCOSAL | Status: AC

## 2020-10-07 MED ORDER — BRIMONIDINE TARTRATE 0.15 % OP SOLN
1.0000 [drp] | Freq: Two times a day (BID) | OPHTHALMIC | Status: DC
  Administered 2020-10-08 – 2020-10-10 (×6): 1 [drp] via OPHTHALMIC
  Filled 2020-10-07 (×2): qty 5

## 2020-10-07 MED ORDER — LIDOCAINE 2% (20 MG/ML) 5 ML SYRINGE
INTRAMUSCULAR | Status: DC | PRN
  Administered 2020-10-07: 60 mg via INTRAVENOUS

## 2020-10-07 MED ORDER — OXYCODONE HCL 5 MG/5ML PO SOLN: 5.0000 mg | Freq: Once | ORAL | Status: DC | PRN

## 2020-10-07 MED ORDER — NUTRITIONAL SUPPLEMENT PO LIQD: Freq: Three times a day (TID) | ORAL | Status: DC

## 2020-10-07 MED ORDER — ONDANSETRON HCL 4 MG/2ML IJ SOLN: 4.0000 mg | Freq: Once | INTRAMUSCULAR | Status: DC | PRN

## 2020-10-07 MED ORDER — DEXAMETHASONE SODIUM PHOSPHATE 10 MG/ML IJ SOLN
INTRAMUSCULAR | Status: AC
  Filled 2020-10-07: qty 1

## 2020-10-07 MED ORDER — FENTANYL CITRATE (PF) 100 MCG/2ML IJ SOLN
25.0000 ug | INTRAMUSCULAR | Status: DC | PRN
  Administered 2020-10-07 (×4): 25 ug via INTRAVENOUS

## 2020-10-07 MED ORDER — ACETAMINOPHEN 325 MG PO TABS: 325.0000 mg | ORAL_TABLET | ORAL | Status: DC | PRN

## 2020-10-07 MED ORDER — CEFAZOLIN SODIUM-DEXTROSE 2-4 GM/100ML-% IV SOLN
INTRAVENOUS | Status: AC
  Administered 2020-10-07: 2000 mg
  Filled 2020-10-07: qty 100

## 2020-10-07 MED ORDER — LACTATED RINGERS IV SOLN: INTRAVENOUS | Status: DC

## 2020-10-07 MED ORDER — FENTANYL CITRATE (PF) 100 MCG/2ML IJ SOLN: 25.0000 ug | INTRAMUSCULAR | Status: DC | PRN

## 2020-10-07 MED ORDER — JUVEN PO PACK
1.0000 | PACK | Freq: Two times a day (BID) | ORAL | Status: DC
  Administered 2020-10-08 (×2): 1 via ORAL
  Filled 2020-10-07 (×2): qty 1

## 2020-10-07 MED ORDER — CEFAZOLIN SODIUM-DEXTROSE 2-4 GM/100ML-% IV SOLN
2.0000 g | Freq: Three times a day (TID) | INTRAVENOUS | Status: AC
  Administered 2020-10-07 – 2020-10-08 (×2): 2 g via INTRAVENOUS
  Filled 2020-10-07 (×2): qty 100

## 2020-10-07 MED ORDER — ZINC SULFATE 220 (50 ZN) MG PO CAPS
220.0000 mg | ORAL_CAPSULE | Freq: Every day | ORAL | Status: DC
  Administered 2020-10-07 – 2020-10-10 (×4): 220 mg via ORAL
  Filled 2020-10-07 (×4): qty 1

## 2020-10-07 MED ORDER — MAGNESIUM SULFATE 2 GM/50ML IV SOLN
2.0000 g | Freq: Every day | INTRAVENOUS | Status: DC | PRN
  Filled 2020-10-07: qty 50

## 2020-10-07 MED ORDER — GUAIFENESIN-DM 100-10 MG/5ML PO SYRP: 15.0000 mL | ORAL_SOLUTION | ORAL | Status: DC | PRN

## 2020-10-07 MED ORDER — ONDANSETRON HCL 4 MG/2ML IJ SOLN: 4.0000 mg | Freq: Four times a day (QID) | INTRAMUSCULAR | Status: DC | PRN

## 2020-10-07 MED ORDER — METOPROLOL SUCCINATE ER 25 MG PO TB24
37.5000 mg | ORAL_TABLET | Freq: Every day | ORAL | Status: DC
  Administered 2020-10-07 – 2020-10-10 (×4): 37.5 mg via ORAL
  Filled 2020-10-07 (×4): qty 2

## 2020-10-07 MED ORDER — HYDROMORPHONE HCL 1 MG/ML IJ SOLN
INTRAMUSCULAR | Status: AC
  Filled 2020-10-07: qty 1

## 2020-10-07 MED ORDER — ALBUMIN HUMAN 5 % IV SOLN: INTRAVENOUS | Status: DC | PRN

## 2020-10-07 MED ORDER — OXYCODONE HCL 5 MG PO TABS: 5.0000 mg | ORAL_TABLET | Freq: Once | ORAL | Status: DC | PRN

## 2020-10-07 MED ORDER — METOPROLOL TARTRATE 25 MG PO TABS
37.5000 mg | ORAL_TABLET | Freq: Once | ORAL | Status: AC
  Administered 2020-10-07: 37.5 mg via ORAL
  Filled 2020-10-07 (×2): qty 1

## 2020-10-07 MED ORDER — PANTOPRAZOLE SODIUM 40 MG PO TBEC
40.0000 mg | DELAYED_RELEASE_TABLET | Freq: Every day | ORAL | Status: DC
  Administered 2020-10-07 – 2020-10-10 (×4): 40 mg via ORAL
  Filled 2020-10-07 (×4): qty 1

## 2020-10-07 MED ORDER — ADULT MULTIVITAMIN W/MINERALS CH
1.0000 | ORAL_TABLET | Freq: Every day | ORAL | Status: DC
  Administered 2020-10-08 – 2020-10-10 (×3): 1 via ORAL
  Filled 2020-10-07 (×5): qty 1

## 2020-10-07 MED ORDER — DIPHENHYDRAMINE HCL 50 MG/ML IJ SOLN
INTRAMUSCULAR | Status: AC
  Filled 2020-10-07: qty 1

## 2020-10-07 MED ORDER — LIDOCAINE 2% (20 MG/ML) 5 ML SYRINGE
INTRAMUSCULAR | Status: AC
  Filled 2020-10-07: qty 5

## 2020-10-07 MED ORDER — TIMOLOL MALEATE 0.25 % OP SOLN
1.0000 [drp] | Freq: Two times a day (BID) | OPHTHALMIC | Status: DC
  Administered 2020-10-08 – 2020-10-10 (×6): 1 [drp] via OPHTHALMIC
  Filled 2020-10-07 (×2): qty 5

## 2020-10-07 MED ORDER — MAGNESIUM OXIDE -MG SUPPLEMENT 400 (240 MG) MG PO TABS
400.0000 mg | ORAL_TABLET | Freq: Two times a day (BID) | ORAL | Status: DC
  Administered 2020-10-07 – 2020-10-10 (×6): 400 mg via ORAL
  Filled 2020-10-07 (×10): qty 1

## 2020-10-07 MED ORDER — ALUM & MAG HYDROXIDE-SIMETH 200-200-20 MG/5ML PO SUSP: 15.0000 mL | ORAL | Status: DC | PRN

## 2020-10-07 MED ORDER — PHENOL 1.4 % MT LIQD: 1.0000 | OROMUCOSAL | Status: DC | PRN

## 2020-10-07 SURGICAL SUPPLY — 38 items
BAG COUNTER SPONGE SURGICOUNT (BAG) IMPLANT
BLADE SAW RECIP 87.9 MT (BLADE) ×2 IMPLANT
BLADE SURG 21 STRL SS (BLADE) ×2 IMPLANT
BNDG COHESIVE 6X5 TAN STRL LF (GAUZE/BANDAGES/DRESSINGS) ×2 IMPLANT
CANISTER WOUND CARE 500ML ATS (WOUND CARE) IMPLANT
COVER SURGICAL LIGHT HANDLE (MISCELLANEOUS) ×2 IMPLANT
CUFF TOURN SGL QUICK 34 (TOURNIQUET CUFF)
CUFF TRNQT CYL 34X4.125X (TOURNIQUET CUFF) IMPLANT
DRAPE INCISE IOBAN 66X45 STRL (DRAPES) ×4 IMPLANT
DRAPE U-SHAPE 47X51 STRL (DRAPES) ×2 IMPLANT
DRESSING PREVENA PLUS CUSTOM (GAUZE/BANDAGES/DRESSINGS) ×1 IMPLANT
DRSG PREVENA PLUS CUSTOM (GAUZE/BANDAGES/DRESSINGS) ×2
DURAPREP 26ML APPLICATOR (WOUND CARE) ×2 IMPLANT
ELECT REM PT RETURN 9FT ADLT (ELECTROSURGICAL) ×2
ELECTRODE REM PT RTRN 9FT ADLT (ELECTROSURGICAL) ×1 IMPLANT
GLOVE SURG ORTHO LTX SZ9 (GLOVE) ×2 IMPLANT
GLOVE SURG POLYISO LF SZ6.5 (GLOVE) ×2 IMPLANT
GLOVE SURG UNDER POLY LF SZ7.5 (GLOVE) ×2 IMPLANT
GLOVE SURG UNDER POLY LF SZ9 (GLOVE) ×2 IMPLANT
GOWN STRL REUS W/ TWL LRG LVL3 (GOWN DISPOSABLE) ×1 IMPLANT
GOWN STRL REUS W/ TWL XL LVL3 (GOWN DISPOSABLE) ×2 IMPLANT
GOWN STRL REUS W/TWL LRG LVL3 (GOWN DISPOSABLE) ×2
GOWN STRL REUS W/TWL XL LVL3 (GOWN DISPOSABLE) ×4
KIT BASIN OR (CUSTOM PROCEDURE TRAY) ×2 IMPLANT
KIT TURNOVER KIT B (KITS) ×2 IMPLANT
MANIFOLD NEPTUNE II (INSTRUMENTS) ×2 IMPLANT
NS IRRIG 1000ML POUR BTL (IV SOLUTION) ×2 IMPLANT
PACK ORTHO EXTREMITY (CUSTOM PROCEDURE TRAY) ×2 IMPLANT
PAD ARMBOARD 7.5X6 YLW CONV (MISCELLANEOUS) ×2 IMPLANT
PREVENA RESTOR ARTHOFORM 46X30 (CANNISTER) ×2 IMPLANT
STAPLER VISISTAT 35W (STAPLE) IMPLANT
STOCKINETTE IMPERVIOUS LG (DRAPES) IMPLANT
SUT ETHILON 2 0 PSLX (SUTURE) ×4 IMPLANT
SUT SILK 2 0 (SUTURE) ×2
SUT SILK 2-0 18XBRD TIE 12 (SUTURE) ×1 IMPLANT
TOWEL GREEN STERILE FF (TOWEL DISPOSABLE) ×2 IMPLANT
TUBE CONNECTING 20X1/4 (TUBING) ×2 IMPLANT
YANKAUER SUCT BULB TIP NO VENT (SUCTIONS) ×2 IMPLANT

## 2020-10-07 NOTE — Progress Notes (Signed)
Orthopedic Tech Progress Note Patient Details:  Stephanie Donovan 09-13-1927 [id number] Called order into [facility] Patient ID: [name], female   DOB: 1927-10-08, 87 y.o.   MRN: [id number]  Chip Boer 10/07/2020, 5:56 PM

## 2020-10-07 NOTE — Plan of Care (Signed)
  Problem: Activity: Goal: Risk for activity intolerance will decrease Outcome: Progressing   Problem: Pain Managment: Goal: General experience of comfort will improve Outcome: Progressing   Problem: Safety: Goal: Ability to remain free from injury will improve Outcome: Progressing   

## 2020-10-07 NOTE — Transfer of Care (Signed)
Immediate Anesthesia Transfer of Care Note  Patient: Stephanie Donovan  Procedure(s) Performed: RIGHT ABOVE KNEE AMPUTATION (Right: Knee)  Patient Location: PACU  Anesthesia Type:General  Level of Consciousness: drowsy and patient cooperative  Airway & Oxygen Therapy: Patient Spontanous Breathing and Patient connected to face mask oxygen  Post-op Assessment: Report given to RN and Post -op Vital signs reviewed and stable  Post vital signs: Reviewed and stable  Last Vitals:  Vitals Value Taken Time  BP 150/79 10/07/20 1121  Temp    Pulse 73 10/07/20 1122  Resp 21 10/07/20 1122  SpO2 100 % 10/07/20 1122  Vitals shown include unvalidated device data.  Last Pain:  Vitals:   10/07/20 0851  TempSrc:   PainSc: 0-No pain         Complications: No notable events documented.

## 2020-10-07 NOTE — Anesthesia Postprocedure Evaluation (Signed)
Anesthesia Post Note  Patient: Stephanie Donovan  Procedure(s) Performed: RIGHT ABOVE KNEE AMPUTATION (Right: Knee)     Patient location during evaluation: PACU Anesthesia Type: General Level of consciousness: awake and alert Pain management: pain level controlled Vital Signs Assessment: post-procedure vital signs reviewed and stable Respiratory status: spontaneous breathing, nonlabored ventilation, respiratory function stable and patient connected to nasal cannula oxygen Cardiovascular status: blood pressure returned to baseline and stable Postop Assessment: no apparent nausea or vomiting Anesthetic complications: no   No notable events documented.  Last Vitals:  Vitals:   10/07/20 1305 10/07/20 1335  BP: 122/66 (!) 97/55  Pulse: 71 68  Resp: 18 18  Temp:    SpO2: 99% 95%    Last Pain:  Vitals:   10/07/20 1305  TempSrc:   PainSc: Asleep                 Darren Caldron

## 2020-10-07 NOTE — Op Note (Signed)
10/07/2020  11:25 AM  PATIENT:  Stephanie Donovan    PRE-OPERATIVE DIAGNOSIS:  Infected Hardware Right Femur  POST-OPERATIVE DIAGNOSIS:  Same  PROCEDURE:  RIGHT ABOVE KNEE AMPUTATION Remove deep hardware Apply VAC  SURGEON:  Newt Minion, MD  PHYSICIAN ASSISTANT:None ANESTHESIA:   General  PREOPERATIVE INDICATIONS:  [name] is a  87 y.o. female with a diagnosis of Infected Hardware Right Femur who failed conservative measures and elected for surgical management.    The risks benefits and alternatives were discussed with the patient preoperatively including but not limited to the risks of infection, bleeding, nerve injury, cardiopulmonary complications, the need for revision surgery, among others, and the patient was willing to proceed.  OPERATIVE IMPLANTS: Praveena customizable and Arthur form wound VAC  '@ENCIMAGES'$ @  OPERATIVE FINDINGS: Hardware removed without complications no abscess at the level of amputation muscle had good color and contractility.  OPERATIVE PROCEDURE: Patient was brought the operating room and underwent a general anesthetic.  After adequate levels anesthesia were obtained patient's right lower extremity was prepped using DuraPrep draped into a sterile field a timeout was called.  A fishmouth incision was made through the distal thigh this was carried down to the neurovascular bundle medially.  The vessels were clamped and suture ligated with 2-0 silk.  The incision was carried proximally on the lateral side and the interlocking plate was removed proximally and the distal femur cut was made with a reciprocating saw.  The leg and femoral plate were removed in 1 block of tissue.  Electrocardio was used for further hemostasis wound was irrigated with normal saline the deep and superficial fascial layers were closed using #1 Vicryl skin was closed with 2-0 nylon and staples.  The Praveena customizable and Arnell Sieving form wound VAC was applied this had a good  suction fit patient was extubated taken the PACU in stable condition   DISCHARGE PLANNING:  Antibiotic duration: 23 hours antibiotics  Weightbearing: Nonweightbearing on the right no gait training  Pain medication: Opioid pathway low-dose  Dressing care/ Wound VAC: Continue wound VAC for 1 week  Ambulatory devices: No gait training  Discharge to: Discharge back to skilled nursing.  Follow-up: In the office 1 week post operative.

## 2020-10-07 NOTE — Anesthesia Procedure Notes (Signed)
Procedure Name: LMA Insertion Date/Time: 10/07/2020 10:21 AM Performed by: Gwyndolyn Saxon, CRNA Pre-anesthesia Checklist: Patient identified, Emergency Drugs available, Suction available and Patient being monitored Patient Re-evaluated:Patient Re-evaluated prior to induction Oxygen Delivery Method: Circle system utilized Preoxygenation: Pre-oxygenation with 100% oxygen Induction Type: IV induction Ventilation: Mask ventilation without difficulty LMA: LMA inserted LMA Size: 4.0 Number of attempts: 1 Placement Confirmation: positive ETCO2 and breath sounds checked- equal and bilateral Tube secured with: Tape Dental Injury: Teeth and Oropharynx as per pre-operative assessment

## 2020-10-07 NOTE — H&P (Signed)
Stephanie Donovan is an 87 y.o. female.   Chief Complaint: Painful right distal femur with exposed hardware. HPI: Patient is a 87 year old woman who presents in follow-up for open reduction internal fixation distal femur fracture on the right.  Patient states she has had increasing pain and increasing exposure of the hardware at the knee.  Patient is seen with her daughter present for the exam.  Past Medical History:  Diagnosis Date   Anemia    Blood transfusion without reported diagnosis    CHF (congestive heart failure) ([place])    Chronic back pain    Chronic constipation    Cognitive communication deficit    Dysrhythmia 2015   Afib with RVR - 2015, Oaroxyomal Afib, no current meds due do GI Bleed   GI bleed    Glaucoma    HLD (hyperlipidemia)    Hypertension    Hypothyroidism    Stroke ([place]) 08/2014   mini stroke   Syncope 2015   Thyroid disease    TIA (transient ischemic attack) 2015    Past Surgical History:  Procedure Laterality Date   CATARACT EXTRACTION, BILATERAL     COLONOSCOPY     HIP ARTHROPLASTY Right 09/22/2017   Procedure: ARTHROPLASTY BIPOLAR HIP (HEMIARTHROPLASTY);  Surgeon: [name], MD;  Location: [place] [facility] [facility];  Service: Orthopedics;  Laterality: Right;   JOINT REPLACEMENT  11/20/2011   LTKR   ORIF FEMUR FRACTURE Right 12/20/2017   Procedure: OPEN REDUCTION INTERNAL FIXATION (ORIF) DISTAL FEMUR FRACTURE;  Surgeon: [name], MD;  Location: [place];  Service: Orthopedics;  Laterality: Right;   REVERSE SHOULDER ARTHROPLASTY Right 10/09/2017   REVERSE SHOULDER ARTHROPLASTY Right 10/09/2017   Procedure: REVERSE SHOULDER ARTHROPLASTY;  Surgeon: [name], MD;  Location: [place];  Service: Orthopedics;  Laterality: Right;    Family History  Problem Relation Age of Onset   Osteoporosis Mother    Cancer - Colon Father    Schizophrenia Son    Social History:  reports that she has never smoked. She has never used smokeless tobacco. She reports that she  does not drink alcohol and does not use drugs.  Allergies:  Allergies  Allergen Reactions   Aspirin Palpitations and Other (See Comments)    Unspecified Mixed reactions   Epinephrine Other (See Comments)    Rapped heart beat   Penicillins Other (See Comments)    Lost hearing temporarily DID THE REACTION INVOLVE: Swelling of the face/tongue/throat, SOB, or low BP? No Sudden or severe rash/hives, skin peeling, or the inside of the mouth or nose? No Did it require medical treatment? No When did it last happen?       If all above answers are "NO", may proceed with cephalosporin use.    Codeine Palpitations    Medications Prior to Admission  Medication Sig Dispense Refill   acetaminophen (TYLENOL) 500 MG tablet Take 1,000 mg by mouth in the morning and at bedtime.     Amino Acids-Protein Hydrolys (PRO-STAT) LIQD Take 30 mLs by mouth 3 (three) times daily.     brimonidine (ALPHAGAN P) 0.1 % SOLN Place 1 drop into the left eye in the morning and at bedtime.     doxycycline (VIBRA-TABS) 100 MG tablet Take 1 tablet (100 mg total) by mouth every 12 (twelve) hours.     furosemide (LASIX) 20 MG tablet Take 20 mg by mouth daily.     levothyroxine (SYNTHROID) 50 MCG tablet Take 50 mcg by mouth daily before breakfast.  magnesium oxide (MAG-OX) 400 (241.3 Mg) MG tablet Take 1 tablet (400 mg total) by mouth 2 (two) times daily. 30 tablet 0   metoprolol succinate (TOPROL-XL) 25 MG 24 hr tablet Take 1 tablet (25 mg total) by mouth daily. (Patient taking differently: Take 37.5 mg by mouth daily.)     Multiple Vitamin-Folic Acid TABS Take 1 tablet by mouth daily. 0.4 mg     Nutritional Supplements (NUTRITIONAL SUPPLEMENT PO) Take 180 mLs by mouth 3 (three) times daily.     oxyCODONE (OXY IR/ROXICODONE) 5 MG immediate release tablet Take 1 tablet (5 mg total) by mouth every 6 (six) hours as needed for moderate pain or breakthrough pain. 10 tablet 0   timolol (TIMOPTIC) 0.25 % ophthalmic solution Place  1 drop into both eyes 2 (two) times daily.     traMADol-acetaminophen (ULTRACET) 37.5-325 MG tablet Take 1 tablet by mouth 2 (two) times daily.     metoprolol succinate (TOPROL-XL) 25 MG 24 hr tablet Take 12.5 mg by mouth daily as needed (Give 12.5 mg if HR > 99 bpm).      No results found for this or any previous visit (from the past 48 hour(s)). No results found.  Review of Systems  All other systems reviewed and are negative.  Height '5\' 4"'$  (1.626 m), weight 62.9 kg. Physical Exam  Patient is alert, oriented, no adenopathy, well-dressed, normal affect, normal respiratory effort. Examination the entire distal aspect of the supracondylar femoral plate is exposed.  The skin is thin and atrophic there is deformity at the knee.  There is no ascending cellulitis no purulent drainage. Assessment/Plan 1. 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 Exposed orthopaedic hardware [facility])       Plan: With the patient's increased pain and increased exposure of the hardware I have recommended proceeding with an above-the-knee amputation.  Risks and benefits were discussed including risk of the wound not healing.  I feel this should be the best option to minimize risk of infection and to decrease pain.  Patient and daughter state they understand and wish to proceed at this time.  Newt Minion, MD 10/07/2020, 6:48 AM

## 2020-10-07 NOTE — Progress Notes (Signed)
Medication list reviewed with nurse at [facility].

## 2020-10-08 ENCOUNTER — Encounter (HOSPITAL_COMMUNITY): Payer: Self-pay | Admitting: Orthopedic Surgery

## 2020-10-08 LAB — CBC
HCT: 23.3 % — ABNORMAL LOW (ref 36.0–46.0)
Hemoglobin: 7.4 g/dL — ABNORMAL LOW (ref 12.0–15.0)
MCH: 28.8 pg (ref 26.0–34.0)
MCHC: 31.8 g/dL (ref 30.0–36.0)
MCV: 90.7 fL (ref 80.0–100.0)
Platelets: 314 10*3/uL (ref 150–400)
RBC: 2.57 MIL/uL — ABNORMAL LOW (ref 3.87–5.11)
RDW: 14.8 % (ref 11.5–15.5)
WBC: 10.3 10*3/uL (ref 4.0–10.5)
nRBC: 0 % (ref 0.0–0.2)

## 2020-10-08 LAB — BASIC METABOLIC PANEL
Anion gap: 8 (ref 5–15)
BUN: 19 mg/dL (ref 8–23)
CO2: 29 mmol/L (ref 22–32)
Calcium: 8.1 mg/dL — ABNORMAL LOW (ref 8.9–10.3)
Chloride: 98 mmol/L (ref 98–111)
Creatinine, Ser: 0.62 mg/dL (ref 0.44–1.00)
GFR, Estimated: >60 mL/min (ref >60)
Glucose, Bld: 113 mg/dL — ABNORMAL HIGH (ref 70–99)
Potassium: 3.7 mmol/L (ref 3.5–5.1)
Sodium: 135 mmol/L (ref 135–145)

## 2020-10-08 NOTE — Evaluation (Signed)
hysical Therapy Evaluation Patient Details Name: Stephanie Donovan MRN: [id number] DOB: [birth year] Today's Date: 10/08/2020  History of Present Illness  87 year old female with history of dementia, hypothyroidism, HTN, who was recently hospitalized with abnormality of the right distal femur associated with hardware failure and infection.  She has a history of knee replacement, at baseline she is nonambulatory and uses a wheelchair.  Patient here now with R AKA.  Clinical Impression  Patient received sleeping in bed, she is easily roused. Alert, confused. Unable to provide accurate history. She required mod +2 assist for bed mobility, mod +2 assist for brief stand with RW. Patient is pain limited and will continue to benefit from skilled PT while here to improve functional independence, strength and safety.        Recommendations for follow up therapy are one component of a multi-disciplinary discharge planning process, led by the attending physician.  Recommendations may be updated based on patient status, additional functional criteria and insurance authorization.  Follow Up Recommendations SNF;Supervision/Assistance - 24 hour    Equipment Recommendations  None recommended by PT;Other (comment) (TBD)    Recommendations for Other Services       Precautions / Restrictions Precautions Precautions: Fall Restrictions Weight Bearing Restrictions: Yes RLE Weight Bearing: Non weight bearing      Mobility  Bed Mobility Overal bed mobility: Needs Assistance Bed Mobility: Sit to Supine;Supine to Sit     Supine to sit: Mod assist;+2 for physical assistance Sit to supine: Mod assist;+2 for physical assistance   General bed mobility comments: requires assist to raise trunk and to scoot to edge of bed    Transfers Overall transfer level: Needs assistance Equipment used: Rolling walker (2 wheeled) Transfers: Sit to/from Stand Sit to Stand: Mod assist;+2 physical assistance;From  elevated surface         General transfer comment: able to get standing, but painful and stood briefly  Ambulation/Gait             General Gait Details: unable  Stairs            Wheelchair Mobility    Modified Rankin (Stroke Patients Only)       Balance Overall balance assessment: Needs assistance Sitting-balance support: Feet supported Sitting balance-Leahy Scale: Fair Sitting balance - Comments: supervision   Standing balance support: Bilateral upper extremity supported;During functional activity Standing balance-Leahy Scale: Poor Standing balance comment: requires B UE support and external assist to maintain balance                             Pertinent Vitals/Pain Pain Assessment: Faces Faces Pain Scale: Hurts whole lot Pain Location: R leg/amputation site Pain Descriptors / Indicators: Discomfort;Grimacing;Guarding Pain Intervention(s): Monitored during session;Limited activity within patient's tolerance;Repositioned    Home Living Family/patient expects to be discharged to:: Skilled nursing facility                      Prior Function Level of Independence: Needs assistance   Gait / Transfers Assistance Needed: from prior admission, patient was wheelchair bound prior to admission  ADL's / Homemaking Assistance Needed: lives at facility        Hand Dominance   Dominant Hand: Right    Extremity/Trunk Assessment   Upper Extremity Assessment Upper Extremity Assessment: Defer to OT evaluation    Lower Extremity Assessment Lower Extremity Assessment: RLE deficits/detail RLE: Unable to fully assess due to pain RLE Coordination: decreased  gross motor    Cervical / Trunk Assessment Cervical / Trunk Assessment: Normal  Communication   Communication: No difficulties  Cognition Arousal/Alertness: Awake/alert Behavior During Therapy: WFL for tasks assessed/performed Overall Cognitive Status: No family/caregiver present  to determine baseline cognitive functioning                                 General Comments: patient is not oriented to place time or situation, poor historian      General Comments      Exercises     Assessment/Plan    PT Assessment Patient needs continued PT services  PT Problem List Decreased strength;Decreased mobility;Decreased cognition;Decreased activity tolerance;Decreased balance;Pain;Decreased knowledge of precautions;Decreased knowledge of use of DME;Decreased safety awareness       PT Treatment Interventions DME instruction;Therapeutic activities;Functional mobility training;Balance training;Therapeutic exercise;Patient/family education    PT Goals (Current goals can be found in the Care Plan section)  Acute Rehab PT Goals Patient Stated Goal: none stated PT Goal Formulation: Patient unable to participate in goal setting Time For Goal Achievement: 10/21/20    Frequency Min 3X/week   Barriers to discharge        Co-evaluation               AM-PAC PT "6 Clicks" Mobility  Outcome Measure Help needed turning from your back to your side while in a flat bed without using bedrails?: A Lot Help needed moving from lying on your back to sitting on the side of a flat bed without using bedrails?: A Lot Help needed moving to and from a bed to a chair (including a wheelchair)?: A Lot Help needed standing up from a chair using your arms (e.g., wheelchair or bedside chair)?: A Lot Help needed to walk in hospital room?: Total Help needed climbing 3-5 steps with a railing? : Total 6 Click Score: 10    End of Session Equipment Utilized During Treatment: Gait belt Activity Tolerance: Patient limited by pain Patient left: in bed;with call bell/phone within reach;with bed alarm set;with SCD's reapplied Nurse Communication: Mobility status PT Visit Diagnosis: Other abnormalities of gait and mobility (R26.89);Muscle weakness (generalized) (M62.81);Pain Pain  - Right/Left: Right Pain - part of body: Leg    Time: 1435-1456 PT Time Calculation (min) (ACUTE ONLY): 21 min   Charges:   PT Evaluation $PT Eval Moderate Complexity: 1 Mod          Leyton Brownlee, PT, GCS 10/08/20,3:40 PM

## 2020-10-08 NOTE — Progress Notes (Signed)
Patient ID: Stephanie Donovan, female   DOB: 04-Apr-1927, 87 y.o.   MRN: [id number] Is postoperative day 1 right above-the-knee amputation with removal of deep retained hardware.  Patient is resting comfortably this morning there is no drainage in the wound VAC canister.  Plan for discharge to skilled nursing.

## 2020-10-08 NOTE — Evaluation (Signed)
Occupational Therapy Evaluation Patient Details Name: Stephanie Donovan MRN: [id number] DOB: Mar 14, 1927 Today's Date: 10/08/2020   History of Present Illness 87 year old female with history of dementia, hypothyroidism, HTN, who was recently hospitalized with abnormality of the right distal femur associated with hardware failure and infection.  She has a history of knee replacement, at baseline she is nonambulatory and uses a wheelchair.  Patient here now with R AKA.   Clinical Impression   Pt is a poor historian 2/2 cognitive deficits, no family present to confirm PLOF/home setup. Pt reports recently residing in a facility and primarily using a wheelchair for functional mobility. Pt likely required increased S/A with all ADLs/ADL mobility.   Today, pt received semi-reclined in bed, pt agreeable to OT eval. Pt presents with RLE residual limb wound vac, confusion, pain with mobility, imbalance, and deconditioning/weakness. Pt required mod assist x2 for all bed mobility, mod-max assist x2 for sit>stand from EOB using RW, max-total assist for LB self-care, and min assist-mod I for UB self-care. Educated pt on cognitive re-orientation, NWB RLE precautions, joint contracture prevention (no pillow underneath RLE residual limb), RW safety management, and PLB. Amount of S/A unknown, need to confirm with family. See recs below. Will follow pt acutely for OT.      Recommendations for follow up therapy are one component of a multi-disciplinary discharge planning process, led by the attending physician.  Recommendations may be updated based on patient status, additional functional criteria and insurance authorization.   Follow Up Recommendations  SNF;Supervision/Assistance - 24 hour    Equipment Recommendations  Other (comment) (defer to next venue of care)    Recommendations for Other Services Speech consult (speech for cognition)     Precautions / Restrictions Precautions Precautions:  Fall Restrictions Weight Bearing Restrictions: Yes RLE Weight Bearing: Non weight bearing      Mobility Bed Mobility Overal bed mobility: Needs Assistance Bed Mobility: Sit to Supine;Supine to Sit     Supine to sit: Mod assist;+2 for physical assistance Sit to supine: Mod assist;+2 for physical assistance   General bed mobility comments: requires assist to raise trunk and to scoot to edge of bed    Transfers Overall transfer level: Needs assistance Equipment used: Rolling walker (2 wheeled) Transfers: Sit to/from Stand Sit to Stand: Mod assist;+2 physical assistance;From elevated surface;Max assist         General transfer comment: able to get standing, but painful and stood briefly, not able to take steps    Balance Overall balance assessment: Needs assistance Sitting-balance support: Feet supported Sitting balance-Leahy Scale: Fair Sitting balance - Comments: supervision   Standing balance support: Bilateral upper extremity supported;During functional activity Standing balance-Leahy Scale: Poor Standing balance comment: requires B UE support and external assist to maintain balance         ADL either performed or assessed with clinical judgement   ADL Overall ADL's : Needs assistance/impaired Eating/Feeding: Set up;Modified independent;Bed level   Grooming: Wash/dry face;Min guard;Sitting   Upper Body Bathing: Minimal assistance;Sitting   Lower Body Bathing: Maximal assistance;Total assistance;Sitting/lateral leans;Sit to/from stand   Upper Body Dressing : Minimal assistance;Sitting   Lower Body Dressing: Maximal assistance;Total assistance;Sitting/lateral leans;Sit to/from stand   Toilet Transfer: Moderate assistance;+2 for physical assistance;+2 for safety/equipment;RW Toilet Transfer Details (indicate cue type and reason): mod assist x2 for sit>stand from EOB, did not attempt stand pivot transfer to [facility] because pt exhibiting too much pain Toileting-  Clothing Manipulation and Hygiene: Total assistance;Bed level;Maximal assistance (currently with purewick)  Tub/ Shower Transfer:  (NA)   Functional mobility during ADLs: Moderate assistance;Maximal assistance;+2 for physical assistance;+2 for safety/equipment;Rolling walker General ADL Comments: mod-max assist x2 for sit>stand at EOB using RW, not able to take steps today     Vision Baseline Vision/History: 0 No visual deficits Ability to See in Adequate Light: 0 Adequate Patient Visual Report: No change from baseline Vision Assessment?: No apparent visual deficits     Perception Perception Perception Tested?: No   Praxis Praxis Praxis tested?: Not tested    Pertinent Vitals/Pain Pain Assessment: Faces Faces Pain Scale: Hurts whole lot Pain Location: R leg/amputation site Pain Descriptors / Indicators: Discomfort;Grimacing;Guarding Pain Intervention(s): Monitored during session;Limited activity within patient's tolerance;Repositioned     Hand Dominance Right   Extremity/Trunk Assessment Upper Extremity Assessment Upper Extremity Assessment: Overall WFL for tasks assessed   Lower Extremity Assessment Lower Extremity Assessment: RLE deficits/detail RLE: Unable to fully assess due to pain RLE Coordination: decreased gross motor   Cervical / Trunk Assessment Cervical / Trunk Assessment: Normal   Communication Communication Communication: No difficulties   Cognition Arousal/Alertness: Awake/alert Behavior During Therapy: WFL for tasks assessed/performed Overall Cognitive Status: No family/caregiver present to determine baseline cognitive functioning       General Comments: patient is not oriented to place time or situation, poor historian   General Comments  wound vac to RLE residual limb               Home Living Family/patient expects to be discharged to:: Skilled nursing facility       Additional Comments: need to confirm PLOF/home setup but currently  recommending SNF      Prior Functioning/Environment Level of Independence: Needs assistance  Gait / Transfers Assistance Needed: from previous admission, pt was mobilizing using a wheelchair and residing in a facility ADL's / Homemaking Assistance Needed: pt was recently residing in a facility and requiring S/A for all ADLs/ADL mobility, primarily using a wheelchair for functional mobility   Comments: pt reports performing stand pivot transfers from one surface to the next        OT Problem List: Decreased strength;Decreased activity tolerance;Impaired balance (sitting and/or standing);Decreased cognition;Decreased safety awareness;Decreased knowledge of use of DME or AE;Decreased knowledge of precautions;Pain;Increased edema      OT Treatment/Interventions: Self-care/ADL training;Therapeutic exercise;Neuromuscular education;DME and/or AE instruction;Therapeutic activities;Cognitive remediation/compensation;Patient/family education    OT Goals(Current goals can be found in the care plan section) Acute Rehab OT Goals Patient Stated Goal: none stated OT Goal Formulation: With patient Time For Goal Achievement: 10/22/20 Potential to Achieve Goals: Fair  OT Frequency: Min 2X/week       Co-evaluation PT/OT/SLP Co-Evaluation/Treatment: Yes Reason for Co-Treatment: Complexity of the patient's impairments (multi-system involvement);Necessary to address cognition/behavior during functional activity;To address functional/ADL transfers;For patient/therapist safety   OT goals addressed during session: ADL's and self-care;Proper use of Adaptive equipment and DME;Other (comment) (cognition)      AM-PAC OT "6 Clicks" Daily Activity     Outcome Measure Help from another person eating meals?: None Help from another person taking care of personal grooming?: A Little Help from another person toileting, which includes using toliet, bedpan, or urinal?: Total Help from another person bathing  (including washing, rinsing, drying)?: A Lot Help from another person to put on and taking off regular upper body clothing?: A Little Help from another person to put on and taking off regular lower body clothing?: Total 6 Click Score: 14   End of Session Equipment Utilized During Treatment: Gait belt;Rolling walker  Nurse Communication: Mobility status  Activity Tolerance: Patient limited by fatigue;Patient limited by pain Patient left: in bed;with call bell/phone within reach;with bed alarm set  OT Visit Diagnosis: Unsteadiness on feet (R26.81);Muscle weakness (generalized) (M62.81);Pain (cognitive deficits) Pain - Right/Left: Right Pain - part of body: Leg                Time: 1430-1456 OT Time Calculation (min): 26 min Charges:  OT General Charges $OT Visit: 1 Visit OT Evaluation $OT Eval Moderate Complexity: 1 Mod  Michel Bickers, OTR/L [facility] [phone]   Francesca Jewett 10/08/2020, 4:56 PM

## 2020-10-09 LAB — BASIC METABOLIC PANEL
Anion gap: 5 (ref 5–15)
BUN: 22 mg/dL (ref 8–23)
CO2: 30 mmol/L (ref 22–32)
Calcium: 7.9 mg/dL — ABNORMAL LOW (ref 8.9–10.3)
Chloride: 100 mmol/L (ref 98–111)
Creatinine, Ser: 0.45 mg/dL (ref 0.44–1.00)
GFR, Estimated: >60 mL/min (ref >60)
Glucose, Bld: 102 mg/dL — ABNORMAL HIGH (ref 70–99)
Potassium: 3.5 mmol/L (ref 3.5–5.1)
Sodium: 135 mmol/L (ref 135–145)

## 2020-10-09 LAB — CBC
HCT: 20.4 % — ABNORMAL LOW (ref 36.0–46.0)
Hemoglobin: 6.6 g/dL — CL (ref 12.0–15.0)
MCH: 28.2 pg (ref 26.0–34.0)
MCHC: 32.4 g/dL (ref 30.0–36.0)
MCV: 87.2 fL (ref 80.0–100.0)
Platelets: 275 10*3/uL (ref 150–400)
RBC: 2.34 MIL/uL — ABNORMAL LOW (ref 3.87–5.11)
RDW: 15.2 % (ref 11.5–15.5)
WBC: 10 10*3/uL (ref 4.0–10.5)
nRBC: 0 % (ref 0.0–0.2)

## 2020-10-09 LAB — HEMOGLOBIN AND HEMATOCRIT, BLOOD
HCT: 27.4 % — ABNORMAL LOW (ref 36.0–46.0)
Hemoglobin: 9 g/dL — ABNORMAL LOW (ref 12.0–15.0)

## 2020-10-09 LAB — PREPARE RBC (CROSSMATCH)

## 2020-10-09 MED ORDER — ENSURE ENLIVE PO LIQD
237.0000 mL | Freq: Two times a day (BID) | ORAL | Status: DC
  Administered 2020-10-09 (×2): 237 mL via ORAL

## 2020-10-09 MED ORDER — ENSURE ENLIVE PO LIQD
237.0000 mL | Freq: Three times a day (TID) | ORAL | Status: DC
  Administered 2020-10-09 – 2020-10-10 (×2): 237 mL via ORAL

## 2020-10-09 NOTE — [place] FL2 (Signed)
[place] MEDICAID FL2 LEVEL OF CARE SCREENING TOOL     IDENTIFICATION  Patient Name: Stephanie Donovan Birthdate: [birth year] Sex: female Admission Date (Current Location): 10/07/2020  [facility] and [place] Number:  Herbalist and Address:  [facility] [place], [place] [address] [place], [place] [address]      Provider Number: [id number]  Attending Physician Name and Address:  Newt Minion, MD  Relative Name and Phone Number:  [place], [place] Daughter [phone]    Current Level of Care: Hospital Recommended Level of Care: [place] Prior Approval Number:    Date Approved/Denied:   PASRR Number: [id number]  Discharge Plan: SNF    Current Diagnoses: Patient Active Problem List   Diagnosis Date Noted   Above knee amputation of right lower extremity ([place]) 10/07/2020   Subacute osteomyelitis of right femur ([place])    Infected hardware in right leg, sequela    Hardware complicating wound infection ([place])    Cellulitis 07/27/2020   Supracondylar fracture of right femur, closed, initial encounter ([place]) 01/01/2018   Closed fracture of right distal femur ([place]) 12/24/2017   Acute blood loss anemia 12/19/2017   Malnutrition of moderate degree 12/18/2017   Femur fracture ([place]) 12/17/2017   Fall 12/17/2017   HLD (hyperlipidemia) 12/17/2017   Chronic anemia 12/17/2017   Closed 4-part fracture of proximal humerus, right, initial encounter 10/09/2017   Paroxysmal A-fib ([place]) 09/21/2017   Closed displaced fracture of right femoral neck ([place]) 09/21/2017   Closed fracture of right proximal humerus 09/21/2017   Dehydration 09/21/2017   Hyponatremia 09/21/2017   Elevated CK 09/21/2017   Thyroid nodule 09/21/2017   Closed right hip fracture ([place]) 09/21/2017   Chronic diastolic CHF (congestive heart failure) ([place]) 09/21/2017   Sinus tachycardia 09/21/2017   Acute lower UTI 09/21/2017   TIA (transient ischemic attack) 08/25/2014   Aphasia  08/25/2014   Chronic constipation 05/19/2014   Fracture of multiple pubic rami ([place]) 04/11/2014   Pubic bone fracture ([place]) 04/11/2014   Left hip pain 04/11/2014   Atrial fibrillation with RVR ([place]) 03/28/2013   Diarrhea 03/28/2013   Hypothyroidism 03/24/2013   Syncope 03/24/2013   Glaucoma 03/24/2013   Hypokalemia 03/24/2013   Acute gastroenteritis 03/24/2013    Orientation RESPIRATION BLADDER Height & Weight     Self  Normal External catheter Weight: 138 lb 10.7 oz (62.9 kg) Height:  '5\' 4"'$  (162.6 cm)  BEHAVIORAL SYMPTOMS/MOOD NEUROLOGICAL BOWEL NUTRITION STATUS      Incontinent Diet (see discharge summary)  AMBULATORY STATUS COMMUNICATION OF NEEDS Skin   Total Care Verbally Normal, Other (Comment) (ecchymosis)                       Personal Care Assistance Level of Assistance  Bathing, Feeding, Dressing Bathing Assistance: Maximum assistance Feeding assistance: Limited assistance Dressing Assistance: Maximum assistance     Functional Limitations Info  Sight, Hearing, Speech Sight Info: Adequate Hearing Info: Adequate Speech Info: Adequate    SPECIAL CARE FACTORS FREQUENCY  PT (By licensed PT), OT (By licensed OT)     PT Frequency: 5x week OT Frequency: 5x week            Contractures Contractures Info: Not present    Additional Factors Info  Code Status, Allergies Code Status Info: full Allergies Info: Aspirin, Penicillins, Codeine           Current Medications (10/09/2020):  This is the current hospital active medication list Current Facility-Administered Medications  Medication Dose Route Frequency Provider Last Rate Last Admin   0.9 %  sodium chloride infusion   Intravenous Continuous Persons, Bevely Palmer, [place]   Stopped at 10/08/20 1916   acetaminophen (TYLENOL) tablet 1,000 mg  1,000 mg Oral BID Persons, Bevely Palmer, PA   1,000 mg at 10/09/20 1015   alum & mag hydroxide-simeth (MAALOX/MYLANTA) 200-200-20 MG/5ML suspension 15-30 mL  15-30 mL Oral  Q2H PRN Persons, Bevely Palmer, PA       ascorbic acid (VITAMIN C) tablet 1,000 mg  1,000 mg Oral Daily Persons, Bevely Palmer, PA   1,000 mg at 10/09/20 1014   bisacodyl (DULCOLAX) EC tablet 5 mg  5 mg Oral Daily PRN Persons, Bevely Palmer, PA       brimonidine (ALPHAGAN) 0.15 % ophthalmic solution 1 drop  1 drop Left Eye BID Persons, Bevely Palmer, PA   1 drop at 10/09/20 1017   docusate sodium (COLACE) capsule 100 mg  100 mg Oral Daily Persons, Bevely Palmer, PA   100 mg at 10/09/20 1015   feeding supplement (ENSURE ENLIVE / ENSURE PLUS) liquid 237 mL  237 mL Oral BID BM Newt Minion, MD   237 mL at 10/09/20 1209   guaiFENesin-dextromethorphan (ROBITUSSIN DM) 100-10 MG/5ML syrup 15 mL  15 mL Oral Q4H PRN Persons, Bevely Palmer, PA       HYDROmorphone (DILAUDID) injection 0.5 mg  0.5 mg Intravenous Q4H PRN Persons, Bevely Palmer, PA   0.5 mg at 10/07/20 1500   levothyroxine (SYNTHROID) tablet 50 mcg  50 mcg Oral Q0600 Persons, Bevely Palmer, [place]   50 mcg at 10/09/20 [id number]   magnesium oxide (MAG-OX) tablet 400 mg  400 mg Oral BID Persons, Bevely Palmer, PA   400 mg at 10/09/20 1015   magnesium sulfate IVPB 2 g 50 mL  2 g Intravenous Daily PRN Persons, Bevely Palmer, PA       metoprolol succinate (TOPROL-XL) 24 hr tablet 37.5 mg  37.5 mg Oral Daily Persons, Bevely Palmer, PA   37.5 mg at 10/08/20 [id number]   multivitamin with minerals tablet 1 tablet  1 tablet Oral Daily Persons, Bevely Palmer, PA   1 tablet at 10/09/20 1015   ondansetron (ZOFRAN) injection 4 mg  4 mg Intravenous Q6H PRN Persons, Bevely Palmer, PA       oxyCODONE (Oxy IR/ROXICODONE) immediate release tablet 5 mg  5 mg Oral Q4H PRN Persons, Bevely Palmer, PA   5 mg at 10/07/20 1638   pantoprazole (PROTONIX) EC tablet 40 mg  40 mg Oral Daily Persons, Bevely Palmer, PA   40 mg at 10/09/20 1015   phenol (CHLORASEPTIC) mouth spray 1 spray  1 spray Mouth/Throat PRN Persons, Bevely Palmer, PA       polyethylene glycol (MIRALAX / GLYCOLAX) packet 17 g  17 g Oral Daily PRN Persons, Bevely Palmer, PA        potassium chloride SA (KLOR-CON) CR tablet 20-40 mEq  20-40 mEq Oral Daily PRN Persons, Bevely Palmer, PA       timolol (TIMOPTIC) 0.25 % ophthalmic solution 1 drop  1 drop Both Eyes BID Persons, Bevely Palmer, PA   1 drop at 10/09/20 1016   zinc sulfate capsule 220 mg  220 mg Oral Daily Persons, Bevely Palmer, PA   220 mg at 10/09/20 1015     Discharge Medications: Please see discharge summary for a list of discharge medications.  Relevant Imaging Results:  Relevant Lab Results:   Additional Information SS# [id number]. Pt is  vaccinated for covid, no booster known.  Joanne Chars, LCSW

## 2020-10-09 NOTE — TOC Initial Note (Signed)
Transition of Care [facility]) - Initial/Assessment Note    Patient Details  Name: Stephanie Donovan MRN: [id number] Date of Birth: 1927-06-06  [facility]) CM/SW Contact:    Joanne Chars, LCSW Phone Number: 10/09/2020, 1:01 PM  Clinical Narrative:  CSW attempted to speak with pt in room, pt unable to provide information but did give permission for pt to speak with daughter.  Choice document for SNF left in room.  CSW spoke with daughter [place] by phone.  Pt has been LTC resident at [facility] for several months and daughter would like her to return to rehab there.  Pt is vaccinated for covid but no booster, per daughter.                  Expected Discharge Plan: Skilled Nursing Facility Barriers to Discharge: Continued Medical Work up   Patient Goals and CMS Choice   CMS Medicare.gov Compare Post Acute Care list provided to:: Patient (left in pt room) Choice offered to / list presented to : Patient  Expected Discharge Plan and Services Expected Discharge Plan: [place] In-house Referral: Clinical Social Work   Post Acute Care Choice: [place] Living arrangements for the past 2 months: [place]                                      Prior Living Arrangements/Services Living arrangements for the past 2 months: [place] Lives with:: Facility Resident Patient language and need for interpreter reviewed:: Yes        Need for Family Participation in Patient Care: Yes (Comment) Care giver support system in place?: Yes (comment) Current home services: Other (comment) (na) Criminal Activity/Legal Involvement Pertinent to Current Situation/Hospitalization: No - Comment as needed  Activities of Daily Living Home Assistive Devices/Equipment: Dentures (specify type), Wheelchair ADL Screening (condition at time of admission) Patient's cognitive ability adequate to safely complete daily activities?:  No Is the patient deaf or have difficulty hearing?: No Does the patient have difficulty seeing, even when wearing glasses/contacts?: Yes Does the patient have difficulty concentrating, remembering, or making decisions?: Yes Patient able to express need for assistance with ADLs?: No Does the patient have difficulty dressing or bathing?: Yes Independently performs ADLs?: No Dressing (OT): Needs assistance Is this a change from baseline?: Pre-admission baseline Grooming: Needs assistance Is this a change from baseline?: Pre-admission baseline Feeding: Needs assistance Is this a change from baseline?: Pre-admission baseline Bathing: Needs assistance Is this a change from baseline?: Pre-admission baseline Toileting: Needs assistance Is this a change from baseline?: Pre-admission baseline In/Out Bed: Needs assistance Is this a change from baseline?: Pre-admission baseline Walks in Home: Needs assistance Is this a change from baseline?: Pre-admission baseline Does the patient have difficulty walking or climbing stairs?: Yes Weakness of Legs: Both Weakness of Arms/Hands: Both  Permission Sought/Granted                  Emotional Assessment Appearance:: Appears stated age Attitude/Demeanor/Rapport: Unable to Assess Affect (typically observed): Unable to Assess Orientation: : Oriented to Self Alcohol / Substance Use: Not Applicable Psych Involvement: No (comment)  Admission diagnosis:  Above knee amputation of right lower extremity [facility]) [id number] Patient Active Problem List   Diagnosis Date Noted   Above knee amputation of right lower extremity ([place]) 10/07/2020   Subacute osteomyelitis of right femur ([place])    Infected hardware  in right leg, sequela    Hardware complicating wound infection ([place])    Cellulitis 07/27/2020   Supracondylar fracture of right femur, closed, initial encounter ([place]) 01/01/2018   Closed fracture of right distal femur ([place]) 12/24/2017   Acute blood  loss anemia 12/19/2017   Malnutrition of moderate degree 12/18/2017   Femur fracture ([place]) 12/17/2017   Fall 12/17/2017   HLD (hyperlipidemia) 12/17/2017   Chronic anemia 12/17/2017   Closed 4-part fracture of proximal humerus, right, initial encounter 10/09/2017   Paroxysmal A-fib ([place]) 09/21/2017   Closed displaced fracture of right femoral neck ([place]) 09/21/2017   Closed fracture of right proximal humerus 09/21/2017   Dehydration 09/21/2017   Hyponatremia 09/21/2017   Elevated CK 09/21/2017   Thyroid nodule 09/21/2017   Closed right hip fracture ([place]) 09/21/2017   Chronic diastolic CHF (congestive heart failure) ([place]) 09/21/2017   Sinus tachycardia 09/21/2017   Acute lower UTI 09/21/2017   TIA (transient ischemic attack) 08/25/2014   Aphasia 08/25/2014   Chronic constipation 05/19/2014   Fracture of multiple pubic rami ([place]) 04/11/2014   Pubic bone fracture ([place]) 04/11/2014   Left hip pain 04/11/2014   Atrial fibrillation with RVR ([place]) 03/28/2013   Diarrhea 03/28/2013   Hypothyroidism 03/24/2013   Syncope 03/24/2013   Glaucoma 03/24/2013   Hypokalemia 03/24/2013   Acute gastroenteritis 03/24/2013   PCP:  System, Provider Not In Pharmacy:  No Pharmacies Listed    Social Determinants of Health (SDOH) Interventions    Readmission Risk Interventions No flowsheet data found.

## 2020-10-09 NOTE — Progress Notes (Signed)
Patient ID: Stephanie Donovan, female   DOB: 06-22-1927, 87 y.o.   MRN: [id number] Patient is status post above-the-knee amputation.  There is no drainage in the wound VAC canister.  Patient's hemoglobin dropped to 6.6 secondary to acute blood loss anemia.  She is receiving 2 units of packed red blood cells transfusion in progress at this time.  Anticipate discharge back to skilled nursing on Monday.

## 2020-10-09 NOTE — Progress Notes (Signed)
Initial Nutrition Assessment  DOCUMENTATION CODES:   Not applicable  INTERVENTION:  Provide Ensure Enlive po TID, each supplement provides 350 kcal and 20 grams of protein.  Encourage adequate PO intake.   NUTRITION DIAGNOSIS:   Increased nutrient needs related to post-op healing as evidenced by estimated needs.  GOAL:   Patient will meet greater than or equal to 90% of their needs  MONITOR:   PO intake, Supplement acceptance, Skin, Weight trends, Labs, I & O's  REASON FOR ASSESSMENT:   Consult Assessment of nutrition requirement/status  ASSESSMENT:   87 year old female with history of CHF, dementia who presents for right open reduction internal fixation distal femur fracture who failed conservative measures and elected for surgical management. Pt with subacute osteomyelitis of R femur.  9/16 - s/p  right AKA, removal of deep hardware, application of VAC  Pt unavailable during attempted time of contact. Meal completion has been 25%. RD to order nutritional supplements to aid in caloric and protein needs. Per MD, likely discharge back to SNF tomorrow. Unable to complete Nutrition-Focused physical exam at this time.   Labs and medications reviewed.   Diet Order:   Diet Order             Diet Carb Modified Fluid consistency: Thin; Room service appropriate? Yes with Assist  Diet effective now                   EDUCATION NEEDS:   Not appropriate for education at this time  Skin:  Skin Assessment: Skin Integrity Issues: Skin Integrity Issues:: Incisions, Wound VAC Wound Vac: R thigh Incisions: R leg  Last BM:  9/17  Height:   Ht Readings from Last 1 Encounters:  10/07/20 '5\' 4"'$  (1.626 m)    Weight:   Wt Readings from Last 1 Encounters:  10/07/20 62.9 kg   BMI:  Body mass index is 23.8 kg/m.  Estimated Nutritional Needs:   Kcal:  1650-1850  Protein:  75-85 grams  Fluid:  >/= 1.6 L/day  Corrin Parker, MS, RD, LDN RD pager number/after  hours weekend pager number on Amion.

## 2020-10-10 LAB — BASIC METABOLIC PANEL
Anion gap: 7 (ref 5–15)
BUN: 15 mg/dL (ref 8–23)
CO2: 28 mmol/L (ref 22–32)
Calcium: 7.9 mg/dL — ABNORMAL LOW (ref 8.9–10.3)
Chloride: 98 mmol/L (ref 98–111)
Creatinine, Ser: 0.38 mg/dL — ABNORMAL LOW (ref 0.44–1.00)
GFR, Estimated: >60 mL/min (ref >60)
Glucose, Bld: 97 mg/dL (ref 70–99)
Potassium: 3.6 mmol/L (ref 3.5–5.1)
Sodium: 133 mmol/L — ABNORMAL LOW (ref 135–145)

## 2020-10-10 LAB — CBC
HCT: 24.2 % — ABNORMAL LOW (ref 36.0–46.0)
Hemoglobin: 7.8 g/dL — ABNORMAL LOW (ref 12.0–15.0)
MCH: 28.5 pg (ref 26.0–34.0)
MCHC: 32.2 g/dL (ref 30.0–36.0)
MCV: 88.3 fL (ref 80.0–100.0)
Platelets: 272 10*3/uL (ref 150–400)
RBC: 2.74 MIL/uL — ABNORMAL LOW (ref 3.87–5.11)
RDW: 15.7 % — ABNORMAL HIGH (ref 11.5–15.5)
WBC: 10.1 10*3/uL (ref 4.0–10.5)
nRBC: 0 % (ref 0.0–0.2)

## 2020-10-10 LAB — SARS CORONAVIRUS 2 (TAT 6-24 HRS): SARS Coronavirus 2: NEGATIVE

## 2020-10-10 LAB — MAGNESIUM: Magnesium: 1.8 mg/dL (ref 1.7–2.4)

## 2020-10-10 MED ORDER — ZINC SULFATE 220 (50 ZN) MG PO CAPS: 220.0000 mg | ORAL_CAPSULE | Freq: Every day | ORAL | Status: DC

## 2020-10-10 MED ORDER — OXYCODONE HCL 5 MG PO TABS: 5.0000 mg | ORAL_TABLET | Freq: Four times a day (QID) | ORAL | 0 refills | Status: DC | PRN

## 2020-10-10 NOTE — Progress Notes (Signed)
Physical Therapy Treatment Patient Details Name: Stephanie Donovan MRN: [id number] DOB: 03-14-1927 Today's Date: 10/10/2020   History of Present Illness 87 year old female with history of dementia, hypothyroidism, HTN, who was recently hospitalized with abnormality of the right distal femur associated with hardware failure and infection.  She has a history of knee replacement, at baseline she is nonambulatory and uses a wheelchair.  Patient here now with R AKA.    PT Comments    Patient received in bed, confused and repeatedly stating "I don't feel good" and "why did they take my leg". Soiled and required totalA for pericare, as well as ModAx2 to get to/from EOB. Deferred transfers/gait today due to loose BMs and nausea. Left in bed with all needs met, bed alarm active. Will continue efforts.     Recommendations for follow up therapy are one component of a multi-disciplinary discharge planning process, led by the attending physician.  Recommendations may be updated based on patient status, additional functional criteria and insurance authorization.  Follow Up Recommendations  SNF;Supervision/Assistance - 24 hour     Equipment Recommendations  None recommended by PT;Other (comment) (TBD)    Recommendations for Other Services       Precautions / Restrictions Precautions Precautions: Fall Restrictions Weight Bearing Restrictions: Yes RLE Weight Bearing: Non weight bearing     Mobility  Bed Mobility Overal bed mobility: Needs Assistance Bed Mobility: Sit to Supine;Supine to Sit Rolling: Mod assist   Supine to sit: Mod assist;+2 for physical assistance Sit to supine: Mod assist;+2 for physical assistance   General bed mobility comments: requires assist to raise trunk and to scoot to edge of bed    Transfers                 General transfer comment: deferred- active nausea and loose BMs  Ambulation/Gait             General Gait Details: unable   Stairs              Wheelchair Mobility    Modified Rankin (Stroke Patients Only)       Balance Overall balance assessment: Needs assistance Sitting-balance support: Feet supported Sitting balance-Leahy Scale: Fair Sitting balance - Comments: supervision                                    Cognition Arousal/Alertness: Awake/alert Behavior During Therapy: WFL for tasks assessed/performed Overall Cognitive Status: No family/caregiver present to determine baseline cognitive functioning                                 General Comments: patient is not oriented to place time or situation, poor historian, repeatedly asks why her leg was taken      Exercises      General Comments        Pertinent Vitals/Pain Pain Assessment: Faces Faces Pain Scale: Hurts even more Pain Location: R leg/amputation site Pain Descriptors / Indicators: Discomfort;Grimacing;Guarding Pain Intervention(s): Limited activity within patient's tolerance;Monitored during session    Home Living                      Prior Function            PT Goals (current goals can now be found in the care plan section) Acute Rehab PT Goals Patient Stated Goal: none stated  PT Goal Formulation: Patient unable to participate in goal setting Time For Goal Achievement: 10/21/20 Progress towards PT goals: Progressing toward goals    Frequency    Min 3X/week      PT Plan Current plan remains appropriate    Co-evaluation              AM-PAC PT "6 Clicks" Mobility   Outcome Measure  Help needed turning from your back to your side while in a flat bed without using bedrails?: A Lot Help needed moving from lying on your back to sitting on the side of a flat bed without using bedrails?: Total Help needed moving to and from a bed to a chair (including a wheelchair)?: Total Help needed standing up from a chair using your arms (e.g., wheelchair or bedside chair)?: Total Help  needed to walk in hospital room?: Total Help needed climbing 3-5 steps with a railing? : Total 6 Click Score: 7    End of Session   Activity Tolerance: Other (comment) (limited by nausea and loose BM) Patient left: in bed;with call bell/phone within reach;with bed alarm set Nurse Communication: Mobility status PT Visit Diagnosis: Other abnormalities of gait and mobility (R26.89);Muscle weakness (generalized) (M62.81);Pain Pain - Right/Left: Right Pain - part of body: Leg     Time: 4098-1191 PT Time Calculation (min) (ACUTE ONLY): 18 min  Charges:  $Therapeutic Activity: 8-22 mins                    Windell Norfolk, DPT, PN2   Supplemental Physical Therapist [place]    Pager [phone] Acute Rehab Office [phone]

## 2020-10-10 NOTE — TOC Transition Note (Signed)
Transition of Care [facility]) - CM/SW Discharge Note   Patient Details  Name: Stephanie Donovan MRN: [id number] Date of Birth: Apr 22, 1927  Transition of Care [facility]) CM/SW Contact:  Milinda Antis, [place] Phone Number: 10/10/2020, 3:44 PM   Clinical Narrative:    Patient will DC to: [facility]  Anticipated DC date:  10/10/2020 Family notified: YES Transport by: Corey Harold   Per MD patient ready for DC to SNF. RN to call report prior to discharge [phone] room 302A. RN, patient, patient's family, and facility notified of DC. Discharge Summary and FL2 sent to facility. DC packet on chart. Ambulance transport requested for patient.   CSW will sign off for now as social work intervention is no longer needed. Please consult [place] again if new needs arise.     Final next level of care: Skilled Nursing Facility Barriers to Discharge: Barriers Resolved   Patient Goals and CMS Choice   CMS Medicare.gov Compare Post Acute Care list provided to:: Patient (left in pt room) Choice offered to / list presented to : Patient  Discharge Placement              Patient chooses bed at: [facility] Patient to be transferred to facility by: [place] Name of family member notified: Omayra, [place] (Daughter)   [phone] Patient and family notified of of transfer: 10/10/20  Discharge Plan and Services In-house Referral: Clinical Social Work   Post Acute Care Choice: [place]                               Social Determinants of Health (SDOH) Interventions     Readmission Risk Interventions No flowsheet data found.

## 2020-10-10 NOTE — Progress Notes (Signed)
Report called to [facility]. All questions answered. Pt switched to prevena wound vac. Waiting on PTAR for transport.

## 2020-10-10 NOTE — Discharge Summary (Signed)
Discharge Diagnoses:  Active Problems:   Subacute osteomyelitis of right femur (HCC)   Infected hardware in right leg, sequela   Above knee amputation of right lower extremity (HCC)   Surgeries: Procedure(s): RIGHT ABOVE KNEE AMPUTATION on 10/07/2020    Consultants:   Discharged Condition: Improved  Hospital Course: Stephanie Donovan is an 87 y.o. female who was admitted 10/07/2020 with a chief complaint of infected hardware right leg, with a final diagnosis of Infected Hardware Right Femur.  Patient was brought to the operating room on 10/07/2020 and underwent Procedure(s): RIGHT ABOVE KNEE AMPUTATION.    Patient was given perioperative antibiotics:  Anti-infectives (From admission, onward)    Start     Dose/Rate Route Frequency Ordered Stop   10/07/20 1800  ceFAZolin (ANCEF) IVPB 2g/100 mL premix        2 g 200 mL/hr over 30 Minutes Intravenous Every 8 hours 10/07/20 1159 10/08/20 0319   10/07/20 1004  ceFAZolin (ANCEF) 2-4 GM/100ML-% IVPB       Note to Pharmacy: Nicholes Calamity  : cabinet override      10/07/20 1004 10/07/20 1726   10/07/20 0730  clindamycin (CLEOCIN) IVPB 900 mg  Status:  Discontinued        900 mg 100 mL/hr over 30 Minutes Intravenous On call to O.R. 10/07/20 0718 10/07/20 1551   10/07/20 0730  clindamycin (CLEOCIN) IVPB 900 mg  Status:  Discontinued        900 mg 100 mL/hr over 30 Minutes Intravenous On call to O.R. 10/07/20 [id number] 10/07/20 0723     .  Patient was given sequential compression devices, early ambulation, and aspirin for DVT prophylaxis.  Recent vital signs: Patient Vitals for the past 24 hrs:  BP Temp Temp src Pulse Resp SpO2  10/10/20 0727 140/73 98 F (36.7 C) Oral 92 14 95 %  10/10/20 0501 107/65 97.7 F (36.5 C) Oral 91 15 97 %  10/09/20 2306 140/71 98.6 F (37 C) Oral 94 16 98 %  10/09/20 1509 111/61 97.9 F (36.6 C) Oral 88 18 97 %  10/09/20 1318 (!) 103/42 -- -- 90 14 98 %  10/09/20 1121 (!) 109/55 98 F (36.7 C) Oral  90 16 98 %  10/09/20 0900 (!) 109/56 97.8 F (36.6 C) Oral -- 18 98 %  10/09/20 0850 (!) 109/55 98 F (36.7 C) -- 90 16 --  10/09/20 0815 119/65 98.2 F (36.8 C) Oral 95 16 100 %  .  Recent laboratory studies: No results found.  Discharge Medications:   Allergies as of 10/10/2020       Reactions   Aspirin Palpitations, Other (See Comments)   Unspecified Mixed reactions   Penicillins Other (See Comments)   Lost hearing temporarily DID THE REACTION INVOLVE: Swelling of the face/tongue/throat, SOB, or low BP? No Sudden or severe rash/hives, skin peeling, or the inside of the mouth or nose? No Did it require medical treatment? No When did it last happen?       If all above answers are "NO", may proceed with cephalosporin use.   Codeine Palpitations        Medication List     STOP taking these medications    doxycycline 100 MG tablet Commonly known as: VIBRA-TABS   traMADol-acetaminophen 37.5-325 MG tablet Commonly known as: ULTRACET       TAKE these medications    acetaminophen 500 MG tablet Commonly known as: TYLENOL Take 1,000 mg by mouth in the morning and at bedtime.  brimonidine 0.1 % Soln Commonly known as: ALPHAGAN P Place 1 drop into the left eye in the morning and at bedtime.   furosemide 20 MG tablet Commonly known as: LASIX Take 20 mg by mouth daily.   levothyroxine 50 MCG tablet Commonly known as: SYNTHROID Take 50 mcg by mouth daily before breakfast.   magnesium oxide 400 (241.3 Mg) MG tablet Commonly known as: MAG-OX Take 1 tablet (400 mg total) by mouth 2 (two) times daily.   metoprolol succinate 25 MG 24 hr tablet Commonly known as: TOPROL-XL Take 12.5 mg by mouth daily as needed (Give 12.5 mg if HR > 99 bpm). What changed: Another medication with the same name was changed. Make sure you understand how and when to take each.   metoprolol succinate 25 MG 24 hr tablet Commonly known as: TOPROL-XL Take 1 tablet (25 mg total) by mouth  daily. What changed: how much to take   Multiple Vitamin-Folic Acid Tabs Take 1 tablet by mouth daily. 0.4 mg   NUTRITIONAL SUPPLEMENT PO Take 180 mLs by mouth 3 (three) times daily.   oxyCODONE 5 MG immediate release tablet Commonly known as: Oxy IR/ROXICODONE Take 1 tablet (5 mg total) by mouth every 6 (six) hours as needed for severe pain. What changed: reasons to take this   Pro-Stat Liqd Take 30 mLs by mouth 3 (three) times daily.   timolol 0.25 % ophthalmic solution Commonly known as: TIMOPTIC Place 1 drop into both eyes 2 (two) times daily.   zinc sulfate 220 (50 Zn) MG capsule Take 1 capsule (220 mg total) by mouth daily.        Diagnostic Studies: No results found.  Patient benefited maximally from their hospital stay and there were no complications.     Disposition: Discharge disposition: 03-Skilled Nursing Facility      Discharge Instructions     Call MD / Call 911   Complete by: As directed    If you experience chest pain or shortness of breath, CALL 911 and be transported to the hospital emergency room.  If you develope a fever above 101 F, pus (white drainage) or increased drainage or redness at the wound, or calf pain, call your surgeon's office.   Constipation Prevention   Complete by: As directed    Drink plenty of fluids.  Prune juice may be helpful.  You may use a stool softener, such as Colace (over the counter) 100 mg twice a day.  Use MiraLax (over the counter) for constipation as needed.   Diet - low sodium heart healthy   Complete by: As directed    Increase activity slowly as tolerated   Complete by: As directed    Negative Pressure Wound Therapy - Incisional   Complete by: As directed    Change to charged preveena vac on discharge   Negative Pressure Wound Therapy - Incisional   Complete by: As directed    Discharge on preveena vac. Should call office if alarms   Post-operative opioid taper instructions:   Complete by: As directed     POST-OPERATIVE OPIOID TAPER INSTRUCTIONS: It is important to wean off of your opioid medication as soon as possible. If you do not need pain medication after your surgery it is ok to stop day one. Opioids include: Codeine, Hydrocodone(Norco, Vicodin), Oxycodone(Percocet, oxycontin) and hydromorphone amongst others.  Long term and even short term use of opiods can cause: Increased pain response Dependence Constipation Depression Respiratory depression And more.  Withdrawal symptoms can include  Flu like symptoms Nausea, vomiting And more Techniques to manage these symptoms Hydrate well Eat regular healthy meals Stay active Use relaxation techniques(deep breathing, meditating, yoga) Do Not substitute Alcohol to help with tapering If you have been on opioids for less than two weeks and do not have pain than it is ok to stop all together.  Plan to wean off of opioids This plan should start within one week post op of your joint replacement. Maintain the same interval or time between taking each dose and first decrease the dose.  Cut the total daily intake of opioids by one tablet each day Next start to increase the time between doses. The last dose that should be eliminated is the evening dose.          Follow-up Information     Malaijah Houchen, [place]evely [place], [place] Follow up in 1 week(s).   Specialty: Orthopedic Surgery Contact information: [address] [place] [address] [phone]                  Signed: Bevely Palmer Dare Spillman 10/10/2020, 7:38 AM

## 2020-10-10 NOTE — Care Management Important Message (Signed)
Important Message  Patient Details  Name: Stephanie Donovan MRN: [id number] Date of Birth: [birth year]   Medicare Important Message Given:  Yes     Yoshua [facility] [address] [address] 10/10/2020, 3:41 PM

## 2020-10-10 NOTE — Progress Notes (Signed)
Patient is status post above-knee amputation.  She is lying in bed comfortable.  Wound VAC is functioning though the does not have any green checks.   Vital signs stable we will plan for discharge back to nursing facility today

## 2020-10-10 NOTE — Care Management Important Message (Signed)
   Important Message  Patient Details  Name: Stephanie Donovan MRN: [id number] Date of Birth: Jun 17, 1927   Medicare Important Message Given:  Yes     Memory Argue 10/10/2020, 12:57 PM

## 2020-10-11 LAB — TYPE AND SCREEN
ABO/RH(D): O POS
Antibody Screen: POSITIVE
Donor AG Type: NEGATIVE
Donor AG Type: NEGATIVE
Unit division: 0
Unit division: 0

## 2020-10-11 LAB — BPAM RBC
Blood Product Expiration Date: 202210012359
Blood Product Expiration Date: 202210122359
ISSUE DATE / TIME: 202209180830
Unit Type and Rh: 5100
Unit Type and Rh: 5100

## 2020-10-11 LAB — SURGICAL PATHOLOGY

## 2020-10-17 ENCOUNTER — Ambulatory Visit (INDEPENDENT_AMBULATORY_CARE_PROVIDER_SITE_OTHER): Payer: Medicare Other | Admitting: Orthopedic Surgery

## 2020-10-17 ENCOUNTER — Encounter: Payer: Medicare Other | Admitting: Orthopedic Surgery

## 2020-10-17 ENCOUNTER — Other Ambulatory Visit: Payer: Self-pay

## 2020-10-17 DIAGNOSIS — S78111A Complete traumatic amputation at level between right hip and knee, initial encounter: Secondary | ICD-10-CM

## 2020-11-03 ENCOUNTER — Encounter: Payer: Self-pay | Admitting: Orthopedic Surgery

## 2020-11-03 ENCOUNTER — Ambulatory Visit (INDEPENDENT_AMBULATORY_CARE_PROVIDER_SITE_OTHER): Payer: Medicare Other | Admitting: Orthopedic Surgery

## 2020-11-03 DIAGNOSIS — S78111A Complete traumatic amputation at level between right hip and knee, initial encounter: Secondary | ICD-10-CM

## 2020-11-03 NOTE — Progress Notes (Signed)
Office Visit Note   Patient: Stephanie Donovan           Date of Birth: 07-28-1927           MRN: [id number] Visit Date: 11/03/2020              Requested by: No referring provider defined for this encounter. PCP: System, Provider Not In  Chief Complaint  Patient presents with   Right Leg - Routine Post Op    10/07/20 right AKA      HPI: Patient is a 87 year old woman who is status post right above-the-knee amputation for failed internal fixation for supracondylar femur fracture.  Patient has slight dehiscence of the wound medially.  Patient is currently a resident at skilled nursing.  Assessment & Plan: Visit Diagnoses:  1. Unilateral AKA, right (HCC)     Plan: The sutures and staples are removed today.  Began Dial soap cleansing daily plus dry dressing change the eschar medially should resolve without surgical intervention.  Discussed that patient will still need skilled nursing care for both wound care and therapy.  Follow-Up Instructions: Return in about 3 weeks (around 11/24/2020).   Ortho Exam  Patient is alert, oriented, no adenopathy, well-dressed, normal affect, normal respiratory effort. Examination the majority of the wound has healed nicely there is an area medially of a 2 cm and 3 cm area of eschar with incisional breakdown.  There is no exposed bone or tendon there is no cellulitis no drainage.  There is good epithelization over the remainder of the surgical incision.  Imaging: No results found. No images are attached to the encounter.  Labs: Lab Results  Component Value Date   HGBA1C 5.5 08/26/2014   ESRSEDRATE 34 (H) 12/23/2011   CRP 0.7 (H) 04/14/2014   REPTSTATUS 08/01/2020 FINAL 07/27/2020   CULT  07/27/2020    NO GROWTH 5 DAYS Performed at [facility], [place] [address], [place], [place] [address]    LABORGA ESCHERICHIA COLI (A) 07/22/2020     Lab Results  Component Value Date   ALBUMIN 2.6 (L) 07/28/2020   ALBUMIN 2.5 (L)  07/26/2020   ALBUMIN 2.8 (L) 07/23/2020    Lab Results  Component Value Date   MG 1.8 10/10/2020   MG 2.0 07/30/2020   MG 1.9 07/28/2020   Lab Results  Component Value Date   VD25OH 47.0 09/22/2017    No results found for: PREALBUMIN CBC EXTENDED Latest Ref Rng & Units 10/10/2020 10/09/2020 10/09/2020  WBC 4.0 - 10.5 K/uL 10.1 - 10.0  RBC 3.87 - 5.11 MIL/uL 2.74(L) - 2.34(L)  HGB 12.0 - 15.0 g/dL 7.8(L) 9.0(L) 6.6(LL)  HCT 36.0 - 46.0 % 24.2(L) 27.4(L) 20.4(L)  PLT 150 - 400 K/uL 272 - 275  NEUTROABS 1.7 - 7.7 K/uL - - -  LYMPHSABS 0.7 - 4.0 K/uL - - -     There is no height or weight on file to calculate BMI.  Orders:  No orders of the defined types were placed in this encounter.  No orders of the defined types were placed in this encounter.    Procedures: No procedures performed  Clinical Data: No additional findings.  ROS:  All other systems negative, except as noted in the HPI. Review of Systems  Objective: Vital Signs: There were no vitals taken for this visit.  Specialty Comments:  No specialty comments available.  PMFS History: Patient Active Problem List   Diagnosis Date Noted   Above knee amputation of  right lower extremity ([place]) 10/07/2020   Subacute osteomyelitis of right femur ([place])    Infected hardware in right leg, sequela    Hardware complicating wound infection ([place])    Cellulitis 07/27/2020   Supracondylar fracture of right femur, closed, initial encounter ([place]) 01/01/2018   Closed fracture of right distal femur ([place]) 12/24/2017   Acute blood loss anemia 12/19/2017   Malnutrition of moderate degree 12/18/2017   Femur fracture ([place]) 12/17/2017   Fall 12/17/2017   HLD (hyperlipidemia) 12/17/2017   Chronic anemia 12/17/2017   Closed 4-part fracture of proximal humerus, right, initial encounter 10/09/2017   Paroxysmal A-fib ([place]) 09/21/2017   Closed displaced fracture of right femoral neck ([place]) 09/21/2017   Closed fracture of right  proximal humerus 09/21/2017   Dehydration 09/21/2017   Hyponatremia 09/21/2017   Elevated CK 09/21/2017   Thyroid nodule 09/21/2017   Closed right hip fracture ([place]) 09/21/2017   Chronic diastolic CHF (congestive heart failure) ([place]) 09/21/2017   Sinus tachycardia 09/21/2017   Acute lower UTI 09/21/2017   TIA (transient ischemic attack) 08/25/2014   Aphasia 08/25/2014   Chronic constipation 05/19/2014   Fracture of multiple pubic rami ([place]) 04/11/2014   Pubic bone fracture ([place]) 04/11/2014   Left hip pain 04/11/2014   Atrial fibrillation with RVR ([place]) 03/28/2013   Diarrhea 03/28/2013   Hypothyroidism 03/24/2013   Syncope 03/24/2013   Glaucoma 03/24/2013   Hypokalemia 03/24/2013   Acute gastroenteritis 03/24/2013   Past Medical History:  Diagnosis Date   Anemia    Blood transfusion without reported diagnosis    CHF (congestive heart failure) ([place])    Chronic back pain    Chronic constipation    Cognitive communication deficit    Dysrhythmia 2015   Afib with RVR - 2015, Oaroxyomal Afib, no current meds due do GI Bleed   GI bleed    Glaucoma    HLD (hyperlipidemia)    Hypertension    Hypothyroidism    Stroke ([place]) 08/2014   mini stroke   Syncope 2015   Thyroid disease    TIA (transient ischemic attack) 2015    Family History  Problem Relation Age of Onset   Osteoporosis Mother    Cancer - Colon Father    Schizophrenia Son     Past Surgical History:  Procedure Laterality Date   AMPUTATION Right 10/07/2020   Procedure: RIGHT ABOVE KNEE AMPUTATION;  Surgeon: Newt Minion, MD;  Location: [facility];  Service: Orthopedics;  Laterality: Right;   CATARACT EXTRACTION, BILATERAL     COLONOSCOPY     HIP ARTHROPLASTY Right 09/22/2017   Procedure: ARTHROPLASTY BIPOLAR HIP (HEMIARTHROPLASTY);  Surgeon: [name], MD;  Location: [facility];  Service: Orthopedics;  Laterality: Right;   JOINT REPLACEMENT  11/20/2011   LTKR   ORIF FEMUR FRACTURE Right 12/20/2017   Procedure: OPEN  REDUCTION INTERNAL FIXATION (ORIF) DISTAL FEMUR FRACTURE;  Surgeon: [name], MD;  Location: [facility];  Service: Orthopedics;  Laterality: Right;   REVERSE SHOULDER ARTHROPLASTY Right 10/09/2017   REVERSE SHOULDER ARTHROPLASTY Right 10/09/2017   Procedure: REVERSE SHOULDER ARTHROPLASTY;  Surgeon: [name], MD;  Location: [facility];  Service: Orthopedics;  Laterality: Right;   Social History   Occupational History   Not on file  Tobacco Use   Smoking status: Never   Smokeless tobacco: Never  Vaping Use   Vaping Use: Never used  Substance and Sexual Activity   Alcohol use: No   Drug use: No   Sexual  activity: Not Currently    Birth control/protection: Post-menopausal

## 2020-11-07 ENCOUNTER — Encounter: Payer: Medicare Other | Admitting: Orthopedic Surgery

## 2020-11-15 ENCOUNTER — Encounter: Payer: Self-pay | Admitting: Orthopedic Surgery

## 2020-11-15 NOTE — Progress Notes (Signed)
Office Visit Note   Patient: Stephanie Donovan           Date of Birth: [birth year]           MRN: [id number] Visit Date: 10/17/2020              Requested by: No referring provider defined for this encounter. PCP: System, Provider Not In  Chief Complaint  Patient presents with   Right Leg - Routine Post Op    10/07/20 right AKA      HPI: Patient is 10 days status post right above-the-knee amputation.  Assessment & Plan: Visit Diagnoses:  1. Unilateral AKA, right (HCC)     Plan: Start dry dressing changes daily with Dial soap cleansing plan to follow-up in 3 weeks to remove the sutures.  Follow-Up Instructions: Return in about 3 weeks (around 11/07/2020).   Ortho Exam  Patient is alert, oriented, no adenopathy, well-dressed, normal affect, normal respiratory effort. Examination the incision is well approximated there is no drainage there is bruising medially.  No cellulitis no signs of infection.  Imaging: No results found. No images are attached to the encounter.  Labs: Lab Results  Component Value Date   HGBA1C 5.5 08/26/2014   ESRSEDRATE 34 (H) 12/23/2011   CRP 0.7 (H) 04/14/2014   REPTSTATUS 08/01/2020 FINAL 07/27/2020   CULT  07/27/2020    NO GROWTH 5 DAYS Performed at [facility], [place] [address], [place], [place] [address]    LABORGA ESCHERICHIA COLI (A) 07/22/2020     Lab Results  Component Value Date   ALBUMIN 2.6 (L) 07/28/2020   ALBUMIN 2.5 (L) 07/26/2020   ALBUMIN 2.8 (L) 07/23/2020    Lab Results  Component Value Date   MG 1.8 10/10/2020   MG 2.0 07/30/2020   MG 1.9 07/28/2020   Lab Results  Component Value Date   VD25OH 47.0 09/22/2017    No results found for: PREALBUMIN CBC EXTENDED Latest Ref Rng & Units 10/10/2020 10/09/2020 10/09/2020  WBC 4.0 - 10.5 K/uL 10.1 - 10.0  RBC 3.87 - 5.11 MIL/uL 2.74(L) - 2.34(L)  HGB 12.0 - 15.0 g/dL 7.8(L) 9.0(L) 6.6(LL)  HCT 36.0 - 46.0 % 24.2(L) 27.4(L) 20.4(L)  PLT 150 - 400 K/uL 272  - 275  NEUTROABS 1.7 - 7.7 K/uL - - -  LYMPHSABS 0.7 - 4.0 K/uL - - -     There is no height or weight on file to calculate BMI.  Orders:  No orders of the defined types were placed in this encounter.  No orders of the defined types were placed in this encounter.    Procedures: No procedures performed  Clinical Data: No additional findings.  ROS:  All other systems negative, except as noted in the HPI. Review of Systems  Objective: Vital Signs: There were no vitals taken for this visit.  Specialty Comments:  No specialty comments available.  PMFS History: Patient Active Problem List   Diagnosis Date Noted   Above knee amputation of right lower extremity ([place]) 10/07/2020   Subacute osteomyelitis of right femur ([place])    Infected hardware in right leg, sequela    Hardware complicating wound infection ([place])    Cellulitis 07/27/2020   Supracondylar fracture of right femur, closed, initial encounter ([place]) 01/01/2018   Closed fracture of right distal femur ([place]) 12/24/2017   Acute blood loss anemia 12/19/2017   Malnutrition of moderate degree 12/18/2017   Femur fracture ([place]) 12/17/2017   Fall 12/17/2017  HLD (hyperlipidemia) 12/17/2017   Chronic anemia 12/17/2017   Closed 4-part fracture of proximal humerus, right, initial encounter 10/09/2017   Paroxysmal A-fib ([place]) 09/21/2017   Closed displaced fracture of right femoral neck ([place]) 09/21/2017   Closed fracture of right proximal humerus 09/21/2017   Dehydration 09/21/2017   Hyponatremia 09/21/2017   Elevated CK 09/21/2017   Thyroid nodule 09/21/2017   Closed right hip fracture ([place]) 09/21/2017   Chronic diastolic CHF (congestive heart failure) ([place]) 09/21/2017   Sinus tachycardia 09/21/2017   Acute lower UTI 09/21/2017   TIA (transient ischemic attack) 08/25/2014   Aphasia 08/25/2014   Chronic constipation 05/19/2014   Fracture of multiple pubic rami ([place]) 04/11/2014   Pubic bone fracture ([place]) 04/11/2014    Left hip pain 04/11/2014   Atrial fibrillation with RVR ([place]) 03/28/2013   Diarrhea 03/28/2013   Hypothyroidism 03/24/2013   Syncope 03/24/2013   Glaucoma 03/24/2013   Hypokalemia 03/24/2013   Acute gastroenteritis 03/24/2013   Past Medical History:  Diagnosis Date   Anemia    Blood transfusion without reported diagnosis    CHF (congestive heart failure) ([place])    Chronic back pain    Chronic constipation    Cognitive communication deficit    Dysrhythmia 2015   Afib with RVR - 2015, Oaroxyomal Afib, no current meds due do GI Bleed   GI bleed    Glaucoma    HLD (hyperlipidemia)    Hypertension    Hypothyroidism    Stroke ([place]) 08/2014   mini stroke   Syncope 2015   Thyroid disease    TIA (transient ischemic attack) 2015    Family History  Problem Relation Age of Onset   Osteoporosis Mother    Cancer - Colon Father    Schizophrenia Son     Past Surgical History:  Procedure Laterality Date   AMPUTATION Right 10/07/2020   Procedure: RIGHT ABOVE KNEE AMPUTATION;  Surgeon: Newt Minion, MD;  Location: [facility];  Service: Orthopedics;  Laterality: Right;   CATARACT EXTRACTION, BILATERAL     COLONOSCOPY     HIP ARTHROPLASTY Right 09/22/2017   Procedure: ARTHROPLASTY BIPOLAR HIP (HEMIARTHROPLASTY);  Surgeon: [name], MD;  Location: [facility];  Service: Orthopedics;  Laterality: Right;   JOINT REPLACEMENT  11/20/2011   LTKR   ORIF FEMUR FRACTURE Right 12/20/2017   Procedure: OPEN REDUCTION INTERNAL FIXATION (ORIF) DISTAL FEMUR FRACTURE;  Surgeon: [name], MD;  Location: [facility];  Service: Orthopedics;  Laterality: Right;   REVERSE SHOULDER ARTHROPLASTY Right 10/09/2017   REVERSE SHOULDER ARTHROPLASTY Right 10/09/2017   Procedure: REVERSE SHOULDER ARTHROPLASTY;  Surgeon: [name], MD;  Location: [facility];  Service: Orthopedics;  Laterality: Right;   Social History   Occupational History   Not on file  Tobacco Use   Smoking status: Never   Smokeless tobacco:  Never  Vaping Use   Vaping Use: Never used  Substance and Sexual Activity   Alcohol use: No   Drug use: No   Sexual activity: Not Currently    Birth control/protection: Post-menopausal

## 2020-11-28 ENCOUNTER — Other Ambulatory Visit: Payer: Self-pay

## 2020-11-28 ENCOUNTER — Ambulatory Visit (INDEPENDENT_AMBULATORY_CARE_PROVIDER_SITE_OTHER): Payer: Medicare Other | Admitting: Orthopedic Surgery

## 2020-11-28 DIAGNOSIS — S78111A Complete traumatic amputation at level between right hip and knee, initial encounter: Secondary | ICD-10-CM

## 2020-11-29 ENCOUNTER — Encounter: Payer: Self-pay | Admitting: Orthopedic Surgery

## 2020-11-29 NOTE — Progress Notes (Signed)
Office Visit Note   Patient: Stephanie Donovan           Date of Birth: 12/12/1927           MRN: [id number] Visit Date: 11/28/2020              Requested by: No referring provider defined for this encounter. PCP: System, Provider Not In  Chief Complaint  Patient presents with   Right Leg - Follow-up    S/p Right AKA      HPI: Patient is a 87 year old woman who is status post right above-the-knee amputation for failure of internal fixation right femur fracture.  Patient states she is doing well feels more alert and oriented.  Assessment & Plan: Visit Diagnoses:  1. Unilateral AKA, right (HCC)     Plan: Continue with dry dressing change to the incision with cleansing with soap and water.  Patient's family anticipates taking her home from skilled nursing soon.  Follow-Up Instructions: Return in about 4 weeks (around 12/26/2020).   Ortho Exam  Patient is alert, oriented, no adenopathy, well-dressed, normal affect, normal respiratory effort. Examination patient has a small wound medially that is 2 cm in diameter with 100% granulation tissue there is no swelling no cellulitis no signs of infection.  Imaging: No results found.   Labs: Lab Results  Component Value Date   HGBA1C 5.5 08/26/2014   ESRSEDRATE 34 (H) 12/23/2011   CRP 0.7 (H) 04/14/2014   REPTSTATUS 08/01/2020 FINAL 07/27/2020   CULT  07/27/2020    NO GROWTH 5 DAYS Performed at [facility], [place] [address], [place], [place] [address]    LABORGA ESCHERICHIA COLI (A) 07/22/2020     Lab Results  Component Value Date   ALBUMIN 2.6 (L) 07/28/2020   ALBUMIN 2.5 (L) 07/26/2020   ALBUMIN 2.8 (L) 07/23/2020    Lab Results  Component Value Date   MG 1.8 10/10/2020   MG 2.0 07/30/2020   MG 1.9 07/28/2020   Lab Results  Component Value Date   VD25OH 47.0 09/22/2017    No results found for: PREALBUMIN CBC EXTENDED Latest Ref Rng & Units 10/10/2020 10/09/2020 10/09/2020  WBC 4.0 - 10.5 K/uL 10.1 -  10.0  RBC 3.87 - 5.11 MIL/uL 2.74(L) - 2.34(L)  HGB 12.0 - 15.0 g/dL 7.8(L) 9.0(L) 6.6(LL)  HCT 36.0 - 46.0 % 24.2(L) 27.4(L) 20.4(L)  PLT 150 - 400 K/uL 272 - 275  NEUTROABS 1.7 - 7.7 K/uL - - -  LYMPHSABS 0.7 - 4.0 K/uL - - -     There is no height or weight on file to calculate BMI.  Orders:  No orders of the defined types were placed in this encounter.  No orders of the defined types were placed in this encounter.    Procedures: No procedures performed  Clinical Data: No additional findings.  ROS:  All other systems negative, except as noted in the HPI. Review of Systems  Objective: Vital Signs: There were no vitals taken for this visit.  Specialty Comments:  No specialty comments available.  PMFS History: Patient Active Problem List   Diagnosis Date Noted   Above knee amputation of right lower extremity ([place]) 10/07/2020   Subacute osteomyelitis of right femur (HCC)    Infected hardware in right leg, sequela    Hardware complicating wound infection ([place])    Cellulitis 07/27/2020   Supracondylar fracture of right femur, closed, initial encounter ([place]) 01/01/2018   Closed fracture of right distal femur ([place])  12/24/2017   Acute blood loss anemia 12/19/2017   Malnutrition of moderate degree 12/18/2017   Femur fracture ([place]) 12/17/2017   Fall 12/17/2017   HLD (hyperlipidemia) 12/17/2017   Chronic anemia 12/17/2017   Closed 4-part fracture of proximal humerus, right, initial encounter 10/09/2017   Paroxysmal A-fib ([place]) 09/21/2017   Closed displaced fracture of right femoral neck ([place]) 09/21/2017   Closed fracture of right proximal humerus 09/21/2017   Dehydration 09/21/2017   Hyponatremia 09/21/2017   Elevated CK 09/21/2017   Thyroid nodule 09/21/2017   Closed right hip fracture ([place]) 09/21/2017   Chronic diastolic CHF (congestive heart failure) ([place]) 09/21/2017   Sinus tachycardia 09/21/2017   Acute lower UTI 09/21/2017   TIA (transient ischemic attack)  08/25/2014   Aphasia 08/25/2014   Chronic constipation 05/19/2014   Fracture of multiple pubic rami ([place]) 04/11/2014   Pubic bone fracture ([place]) 04/11/2014   Left hip pain 04/11/2014   Atrial fibrillation with RVR ([place]) 03/28/2013   Diarrhea 03/28/2013   Hypothyroidism 03/24/2013   Syncope 03/24/2013   Glaucoma 03/24/2013   Hypokalemia 03/24/2013   Acute gastroenteritis 03/24/2013   Past Medical History:  Diagnosis Date   Anemia    Blood transfusion without reported diagnosis    CHF (congestive heart failure) ([place])    Chronic back pain    Chronic constipation    Cognitive communication deficit    Dysrhythmia 2015   Afib with RVR - 2015, Oaroxyomal Afib, no current meds due do GI Bleed   GI bleed    Glaucoma    HLD (hyperlipidemia)    Hypertension    Hypothyroidism    Stroke ([place]) 08/2014   mini stroke   Syncope 2015   Thyroid disease    TIA (transient ischemic attack) 2015    Family History  Problem Relation Age of Onset   Osteoporosis Mother    Cancer - Colon Father    Schizophrenia Son     Past Surgical History:  Procedure Laterality Date   AMPUTATION Right 10/07/2020   Procedure: RIGHT ABOVE KNEE AMPUTATION;  Surgeon: Newt Minion, MD;  Location: [facility];  Service: Orthopedics;  Laterality: Right;   CATARACT EXTRACTION, BILATERAL     COLONOSCOPY     HIP ARTHROPLASTY Right 09/22/2017   Procedure: ARTHROPLASTY BIPOLAR HIP (HEMIARTHROPLASTY);  Surgeon: [name], MD;  Location: [facility];  Service: Orthopedics;  Laterality: Right;   JOINT REPLACEMENT  11/20/2011   LTKR   ORIF FEMUR FRACTURE Right 12/20/2017   Procedure: OPEN REDUCTION INTERNAL FIXATION (ORIF) DISTAL FEMUR FRACTURE;  Surgeon: [name], MD;  Location: [facility];  Service: Orthopedics;  Laterality: Right;   REVERSE SHOULDER ARTHROPLASTY Right 10/09/2017   REVERSE SHOULDER ARTHROPLASTY Right 10/09/2017   Procedure: REVERSE SHOULDER ARTHROPLASTY;  Surgeon: [name], MD;  Location: [facility];   Service: Orthopedics;  Laterality: Right;   Social History   Occupational History   Not on file  Tobacco Use   Smoking status: Never   Smokeless tobacco: Never  Vaping Use   Vaping Use: Never used  Substance and Sexual Activity   Alcohol use: No   Drug use: No   Sexual activity: Not Currently    Birth control/protection: Post-menopausal

## 2020-12-07 ENCOUNTER — Telehealth: Payer: Self-pay | Admitting: Orthopedic Surgery

## 2020-12-07 NOTE — Telephone Encounter (Signed)
PT calling requesting verbal orders for physical therapy script of   2 weeks (1) 1 week (1) 2 week (1) 1 week (3)  She would like a call back confirming the orders @ [phone]

## 2020-12-07 NOTE — Telephone Encounter (Signed)
Called and sw PT to advise verbal ok for orders as requested below.

## 2020-12-10 ENCOUNTER — Emergency Department (HOSPITAL_COMMUNITY)
Admission: EM | Admit: 2020-12-10 | Discharge: 2020-12-10 | Disposition: A | Discharge status: Home / Self Care | Payer: Medicare Other | Attending: Emergency Medicine | Admitting: Emergency Medicine

## 2020-12-10 ENCOUNTER — Emergency Department (HOSPITAL_COMMUNITY): Payer: Medicare Other

## 2020-12-10 ENCOUNTER — Other Ambulatory Visit: Payer: Self-pay

## 2020-12-10 ENCOUNTER — Encounter (HOSPITAL_COMMUNITY): Payer: Self-pay | Admitting: *Deleted

## 2020-12-10 DIAGNOSIS — E039 Hypothyroidism, unspecified: Secondary | ICD-10-CM | POA: Diagnosis not present

## 2020-12-10 DIAGNOSIS — I5032 Chronic diastolic (congestive) heart failure: Secondary | ICD-10-CM | POA: Diagnosis not present

## 2020-12-10 DIAGNOSIS — N3000 Acute cystitis without hematuria: Secondary | ICD-10-CM | POA: Diagnosis not present

## 2020-12-10 DIAGNOSIS — Z79899 Other long term (current) drug therapy: Secondary | ICD-10-CM | POA: Diagnosis not present

## 2020-12-10 DIAGNOSIS — D72829 Elevated white blood cell count, unspecified: Secondary | ICD-10-CM | POA: Diagnosis not present

## 2020-12-10 DIAGNOSIS — R531 Weakness: Secondary | ICD-10-CM | POA: Diagnosis present

## 2020-12-10 DIAGNOSIS — Z96652 Presence of left artificial knee joint: Secondary | ICD-10-CM | POA: Diagnosis not present

## 2020-12-10 DIAGNOSIS — I110 Hypertensive heart disease with heart failure: Secondary | ICD-10-CM | POA: Diagnosis not present

## 2020-12-10 LAB — CBC WITH DIFFERENTIAL/PLATELET
Abs Immature Granulocytes: 0.02 10*3/uL (ref 0.00–0.07)
Basophils Absolute: 0.1 10*3/uL (ref 0.0–0.1)
Basophils Relative: 1 %
Eosinophils Absolute: 0.1 10*3/uL (ref 0.0–0.5)
Eosinophils Relative: 1 %
HCT: 40.6 % (ref 36.0–46.0)
Hemoglobin: 12.7 g/dL (ref 12.0–15.0)
Immature Granulocytes: 0 %
Lymphocytes Relative: 14 %
Lymphs Abs: 1.5 10*3/uL (ref 0.7–4.0)
MCH: 27 pg (ref 26.0–34.0)
MCHC: 31.3 g/dL (ref 30.0–36.0)
MCV: 86.2 fL (ref 80.0–100.0)
Monocytes Absolute: 1.1 10*3/uL — ABNORMAL HIGH (ref 0.1–1.0)
Monocytes Relative: 10 %
Neutro Abs: 8.5 10*3/uL — ABNORMAL HIGH (ref 1.7–7.7)
Neutrophils Relative %: 74 %
Platelets: 284 10*3/uL (ref 150–400)
RBC: 4.71 MIL/uL (ref 3.87–5.11)
RDW: 14.5 % (ref 11.5–15.5)
WBC: 11.4 10*3/uL — ABNORMAL HIGH (ref 4.0–10.5)
nRBC: 0 % (ref 0.0–0.2)

## 2020-12-10 LAB — COMPREHENSIVE METABOLIC PANEL
ALT: 13 U/L (ref 0–44)
AST: 15 U/L (ref 15–41)
Albumin: 2.9 g/dL — ABNORMAL LOW (ref 3.5–5.0)
Alkaline Phosphatase: 122 U/L (ref 38–126)
Anion gap: 7 (ref 5–15)
BUN: 8 mg/dL (ref 8–23)
CO2: 29 mmol/L (ref 22–32)
Calcium: 8.6 mg/dL — ABNORMAL LOW (ref 8.9–10.3)
Chloride: 96 mmol/L — ABNORMAL LOW (ref 98–111)
Creatinine, Ser: 0.4 mg/dL — ABNORMAL LOW (ref 0.44–1.00)
GFR, Estimated: >60 mL/min (ref >60)
Glucose, Bld: 100 mg/dL — ABNORMAL HIGH (ref 70–99)
Potassium: 4 mmol/L (ref 3.5–5.1)
Sodium: 132 mmol/L — ABNORMAL LOW (ref 135–145)
Total Bilirubin: 0.5 mg/dL (ref 0.3–1.2)
Total Protein: 6.9 g/dL (ref 6.5–8.1)

## 2020-12-10 LAB — LACTIC ACID, PLASMA: Lactic Acid, Venous: 0.9 mmol/L (ref 0.5–1.9)

## 2020-12-10 LAB — URINALYSIS, ROUTINE W REFLEX MICROSCOPIC
Bilirubin Urine: NEGATIVE
Glucose, UA: NEGATIVE mg/dL
Ketones, ur: NEGATIVE mg/dL
Nitrite: NEGATIVE
Protein, ur: NEGATIVE mg/dL
Specific Gravity, Urine: 1.013 (ref 1.005–1.030)
WBC, UA: >50 WBC/hpf — ABNORMAL HIGH (ref 0–5)
pH: 8 (ref 5.0–8.0)

## 2020-12-10 LAB — PROTIME-INR
INR: 1 (ref 0.8–1.2)
Prothrombin Time: 13.6 seconds (ref 11.4–15.2)

## 2020-12-10 MED ORDER — SODIUM CHLORIDE 0.9 % IV SOLN: 1.0000 g | Freq: Once | INTRAVENOUS | Status: DC

## 2020-12-10 MED ORDER — CEFUROXIME AXETIL 250 MG PO TABS: 250.0000 mg | ORAL_TABLET | Freq: Two times a day (BID) | ORAL | 0 refills | Status: AC

## 2020-12-10 MED ORDER — CEFDINIR 300 MG PO CAPS
300.0000 mg | ORAL_CAPSULE | Freq: Once | ORAL | Status: AC
  Administered 2020-12-10: 300 mg via ORAL
  Filled 2020-12-10: qty 1

## 2020-12-10 NOTE — ED Triage Notes (Signed)
EMS reports: BIB [facility] from home for weakness/fatigue, pale, "not feeling well". Also dark stool and increased urination in last 40 minutes. Daughter has been taking care of her since R AKA 6-8 weeks ago. No obvious signs of infection at incision site. Verbalizes odor of UTI. HR 100, RR 24. BS 125. IV 20g R AC (positional). Alert, NAD, calm, interactive, able to sign for herself.

## 2020-12-10 NOTE — Discharge Instructions (Signed)
The antibiotics as prescribed.  Follow-up with your doctor to be rechecked.

## 2020-12-10 NOTE — ED Notes (Signed)
[facility] called, No ETA

## 2020-12-10 NOTE — ED Provider Notes (Signed)
[facility] EMERGENCY DEPARTMENT Provider Note   CSN: [id number] Arrival date & time: 12/10/20  1603     History Chief Complaint  Patient presents with   Weakness    Stephanie Donovan is a 87 y.o. female.   Weakness  Patient presents to the ED for evaluation of of malaise and weakness.  Patient herself is not sure why she is here.  She states she feels fine and she wants to go home.  Patient is not sure who called the ambulance or why the ambulance was called but she does live with her daughter.  According to the nursing report EMS was called because the patient has not been feeling well.  Family noted that her stools have been dark and she has had increased urination.  Patient had an above-the-knee amputation in September of this year.  Patient has been at home with her daughter since then.  Patient is not having complaints of chest pain.  No fevers or chills.  No diarrhea.  Past Medical History:  Diagnosis Date   Anemia    Blood transfusion without reported diagnosis    CHF (congestive heart failure) (HCC)    Chronic back pain    Chronic constipation    Cognitive communication deficit    Dysrhythmia 2015   Afib with RVR - 2015, Oaroxyomal Afib, no current meds due do GI Bleed   GI bleed    Glaucoma    HLD (hyperlipidemia)    Hypertension    Hypothyroidism    Stroke ([place]) 08/2014   mini stroke   Syncope 2015   Thyroid disease    TIA (transient ischemic attack) 2015    Patient Active Problem List   Diagnosis Date Noted   Above knee amputation of right lower extremity ([place]) 10/07/2020   Subacute osteomyelitis of right femur ([place])    Infected hardware in right leg, sequela    Hardware complicating wound infection ([place])    Cellulitis 07/27/2020   Supracondylar fracture of right femur, closed, initial encounter ([place]) 01/01/2018   Closed fracture of right distal femur ([place]) 12/24/2017   Acute blood loss anemia 12/19/2017   Malnutrition of moderate  degree 12/18/2017   Femur fracture ([place]) 12/17/2017   Fall 12/17/2017   HLD (hyperlipidemia) 12/17/2017   Chronic anemia 12/17/2017   Closed 4-part fracture of proximal humerus, right, initial encounter 10/09/2017   Paroxysmal A-fib ([place]) 09/21/2017   Closed displaced fracture of right femoral neck ([place]) 09/21/2017   Closed fracture of right proximal humerus 09/21/2017   Dehydration 09/21/2017   Hyponatremia 09/21/2017   Elevated CK 09/21/2017   Thyroid nodule 09/21/2017   Closed right hip fracture ([place]) 09/21/2017   Chronic diastolic CHF (congestive heart failure) ([place]) 09/21/2017   Sinus tachycardia 09/21/2017   Acute lower UTI 09/21/2017   TIA (transient ischemic attack) 08/25/2014   Aphasia 08/25/2014   Chronic constipation 05/19/2014   Fracture of multiple pubic rami ([place]) 04/11/2014   Pubic bone fracture ([place]) 04/11/2014   Left hip pain 04/11/2014   Atrial fibrillation with RVR ([place]) 03/28/2013   Diarrhea 03/28/2013   Hypothyroidism 03/24/2013   Syncope 03/24/2013   Glaucoma 03/24/2013   Hypokalemia 03/24/2013   Acute gastroenteritis 03/24/2013    Past Surgical History:  Procedure Laterality Date   AMPUTATION Right 10/07/2020   Procedure: RIGHT ABOVE KNEE AMPUTATION;  Surgeon: Newt Minion, MD;  Location: [facility];  Service: Orthopedics;  Laterality: Right;   CATARACT EXTRACTION, BILATERAL     COLONOSCOPY  HIP ARTHROPLASTY Right 09/22/2017   Procedure: ARTHROPLASTY BIPOLAR HIP (HEMIARTHROPLASTY);  Surgeon: [name], MD;  Location: [facility];  Service: Orthopedics;  Laterality: Right;   JOINT REPLACEMENT  11/20/2011   LTKR   ORIF FEMUR FRACTURE Right 12/20/2017   Procedure: OPEN REDUCTION INTERNAL FIXATION (ORIF) DISTAL FEMUR FRACTURE;  Surgeon: [name], MD;  Location: [facility];  Service: Orthopedics;  Laterality: Right;   REVERSE SHOULDER ARTHROPLASTY Right 10/09/2017   REVERSE SHOULDER ARTHROPLASTY Right 10/09/2017   Procedure: REVERSE SHOULDER  ARTHROPLASTY;  Surgeon: [name], MD;  Location: [facility];  Service: Orthopedics;  Laterality: Right;     OB History   No obstetric history on file.     Family History  Problem Relation Age of Onset   Osteoporosis Mother    Cancer - Colon Father    Schizophrenia Son     Social History   Tobacco Use   Smoking status: Never   Smokeless tobacco: Never  Vaping Use   Vaping Use: Never used  Substance Use Topics   Alcohol use: No   Drug use: No    Home Medications Prior to Admission medications   Medication Sig Start Date End Date Taking? Authorizing Provider  Amino Acids-Protein Hydrolys (PRO-STAT) LIQD Take 30 mLs by mouth 3 (three) times daily.   Yes [provider]  brimonidine (ALPHAGAN P) 0.1 % SOLN Place 1 drop into the left eye in the morning and at bedtime.   Yes [provider]  cefUROXime (CEFTIN) 250 MG tablet Take 1 tablet (250 mg total) by mouth 2 (two) times daily with a meal for 7 days. 12/10/20 12/17/20 Yes Dorie Rank, MD  ferrous sulfate 325 (65 FE) MG EC tablet Take 325 mg by mouth daily.   Yes [provider]  furosemide (LASIX) 20 MG tablet Take 20 mg by mouth daily.   Yes [provider]  levothyroxine (SYNTHROID) 50 MCG tablet Take 50 mcg by mouth daily before breakfast.   Yes [provider]  magnesium oxide (MAG-OX) 400 (241.3 Mg) MG tablet Take 1 tablet (400 mg total) by mouth 2 (two) times daily. Patient taking differently: Take 400 mg by mouth daily. 11/30/19  Yes Regalado, Belkys A, MD  metoprolol succinate (TOPROL-XL) 25 MG 24 hr tablet Take 25 mg by mouth daily as needed (Give 12.5 mg if HR > 99 bpm).   Yes [provider]  Multiple Vitamin-Folic Acid TABS Take 1 tablet by mouth daily. 0.4 mg   Yes [provider]  timolol (TIMOPTIC) 0.25 % ophthalmic solution Place 1 drop into both eyes 2 (two) times daily. 12/22/19  Yes [provider]  metoprolol succinate (TOPROL-XL) 25 MG 24  hr tablet Take 1 tablet (25 mg total) by mouth daily. Patient not taking: Reported on 12/10/2020 08/03/20   Charlynne Cousins, MD  oxyCODONE (OXY IR/ROXICODONE) 5 MG immediate release tablet Take 1 tablet (5 mg total) by mouth every 6 (six) hours as needed for severe pain. Patient not taking: Reported on 12/10/2020 10/10/20   Persons, Bevely Palmer, PA  zinc sulfate 220 (50 Zn) MG capsule Take 1 capsule (220 mg total) by mouth daily. Patient not taking: Reported on 12/10/2020 10/10/20   Persons, Bevely Palmer, [place]    Allergies    Aspirin, Penicillins, and Codeine  Review of Systems   Review of Systems  Neurological:  Positive for weakness.  All other systems reviewed and are negative.  Physical Exam Updated Vital Signs BP 117/84  Pulse (!) 102   Temp 98.2 F (36.8 C)   Resp 16   Ht 1.524 m (5')   Wt 68 kg   SpO2 95%   BMI 29.29 kg/m   Physical Exam Vitals and nursing note reviewed.  Constitutional:      Appearance: She is well-developed. She is not ill-appearing or diaphoretic.  HENT:     Head: Normocephalic and atraumatic.     Right Ear: External ear normal.     Left Ear: External ear normal.  Eyes:     General: No scleral icterus.       Right eye: No discharge.        Left eye: No discharge.     Conjunctiva/sclera: Conjunctivae normal.  Neck:     Trachea: No tracheal deviation.  Cardiovascular:     Rate and Rhythm: Normal rate and regular rhythm.  Pulmonary:     Effort: Pulmonary effort is normal. No respiratory distress.     Breath sounds: Normal breath sounds. No stridor. No wheezing or rales.  Abdominal:     General: Bowel sounds are normal. There is no distension.     Palpations: Abdomen is soft.     Tenderness: There is no abdominal tenderness. There is no guarding or rebound.  Musculoskeletal:        General: No tenderness or deformity.     Cervical back: Neck supple.     Comments: Above-the-knee amputation right leg, wound is well-healed, no erythema or  drainage  Skin:    General: Skin is warm and dry.     Coloration: Skin is not pale.     Findings: No rash.  Neurological:     General: No focal deficit present.     Mental Status: She is alert.     Cranial Nerves: No cranial nerve deficit (no facial droop, extraocular movements intact, no slurred speech).     Sensory: No sensory deficit.     Motor: No abnormal muscle tone or seizure activity.     Coordination: Coordination normal.     Comments: Memory is poor, answers questions appropriately  Psychiatric:        Mood and Affect: Mood normal.    ED Results / Procedures / Treatments   Labs (all labs ordered are listed, but only abnormal results are displayed) Labs Reviewed  COMPREHENSIVE METABOLIC PANEL - Abnormal; Notable for the following components:      Result Value   Sodium 132 (*)    Chloride 96 (*)    Glucose, Bld 100 (*)    Creatinine, Ser 0.40 (*)    Calcium 8.6 (*)    Albumin 2.9 (*)    All other components within normal limits  CBC WITH DIFFERENTIAL/PLATELET - Abnormal; Notable for the following components:   WBC 11.4 (*)    Neutro Abs 8.5 (*)    Monocytes Absolute 1.1 (*)    All other components within normal limits  URINALYSIS, ROUTINE W REFLEX MICROSCOPIC - Abnormal; Notable for the following components:   APPearance TURBID (*)    Hgb urine dipstick SMALL (*)    Leukocytes,Ua LARGE (*)    WBC, UA >50 (*)    Bacteria, UA RARE (*)    All other components within normal limits  CULTURE, BLOOD (ROUTINE X 2)  CULTURE, BLOOD (ROUTINE X 2)  URINE CULTURE  LACTIC ACID, PLASMA  PROTIME-INR    EKG EKG Interpretation  Date/Time:  Saturday December 10 2020 16:20:17 EST Ventricular Rate:  94 PR Interval:  151 QRS Duration: 86 QT Interval:  354 QTC Calculation: 443 R Axis:   26 Text Interpretation: Sinus rhythm Low voltage, precordial leads RSR' in V1 or V2, probably normal variant No significant change since last tracing ) Confirmed by Dorie Rank [phone]) on  12/10/2020 4:47:01 PM  Radiology DG Chest 2 View  Result Date: 12/10/2020 CLINICAL DATA:  Suspected sepsis.  Increased urination weakness. EXAM: CHEST - 2 VIEW COMPARISON:  July 22, 2020 FINDINGS: The heart size and mediastinal contours are within normal limits. Both lungs are clear. The visualized skeletal structures are unremarkable. IMPRESSION: No active cardiopulmonary disease. Electronically Signed   By: Dorise Bullion III M.D.   On: 12/10/2020 17:16    Procedures Procedures   Medications Ordered in ED Medications  cefTRIAXone (ROCEPHIN) 1 g in sodium chloride 0.9 % 100 mL IVPB (has no administration in time range)    ED Course  I have reviewed the triage vital signs and the nursing notes.  Pertinent labs & imaging results that were available during my care of the patient were reviewed by me and considered in my medical decision making (see chart for details).  Clinical Course as of 12/10/20 2150  Sat Dec 11, 8655  8469 Metabolic panel unremarkable.  CBC shows a slight elevation of white blood cell count [JK]  1933 Chest x-ray without pneumonia. [JK]  2141 Urinalysis suggests infection with large leukocyte esterase, greater than 50 white blood cells and rare bacteria.  Family has noticed change in the patient's urine [JK]    Clinical Course User Index [JK] Dorie Rank, MD   MDM Rules/Calculators/A&P                           Patient presented with complaints of weakness according to the family and change in her urine odor.  Patient herself did not have any complaints.  Patient's ED work-up is reassuring.  No signs of severe dehydration.  No anemia.  Chest x-ray does not show pneumonia.  Lactic acid is normal and her vital signs have been reassuring.  No signs of sepsis.  Patient however does have evidence of urinary tract infection.  We will give her a dose of antibiotics here.  We will discharge her home on antibiotics.  I did speak with the patient's daughter.  Explained to  her that she does not need to be in the hospital.  Daughter preferred that we monitor her overnight since she cannot come and pick her up tonight and will only be able to pick her up in the morning.  I explained to the daughter that unfortunately cannot hold her in the emergency room overnight but we will not make her come and pick her off if she does not feel safe to do so.  We will see if we can arrange for transportation back home.  Daughter is comfortable with this plan Final Clinical Impression(s) / ED Diagnoses Final diagnoses:  Acute cystitis without hematuria    Rx / DC Orders ED Discharge Orders          Ordered    cefUROXime (CEFTIN) 250 MG tablet  2 times daily with meals        12/10/20 2149             Dorie Rank, MD 12/10/20 2154

## 2020-12-10 NOTE — ED Notes (Signed)
Patient transported to CT 

## 2020-12-13 LAB — URINE CULTURE: Culture: 70000 — AB

## 2020-12-15 LAB — CULTURE, BLOOD (ROUTINE X 2)
Culture: NO GROWTH
Culture: NO GROWTH

## 2020-12-20 ENCOUNTER — Emergency Department (HOSPITAL_COMMUNITY)
Admission: EM | Admit: 2020-12-20 | Discharge: 2020-12-20 | Disposition: A | Discharge status: Home / Self Care | Payer: Medicare Other | Attending: Emergency Medicine | Admitting: Emergency Medicine

## 2020-12-20 ENCOUNTER — Other Ambulatory Visit: Payer: Self-pay

## 2020-12-20 ENCOUNTER — Emergency Department (HOSPITAL_COMMUNITY): Payer: Medicare Other

## 2020-12-20 ENCOUNTER — Encounter (HOSPITAL_COMMUNITY): Payer: Self-pay | Admitting: Emergency Medicine

## 2020-12-20 DIAGNOSIS — Z8673 Personal history of transient ischemic attack (TIA), and cerebral infarction without residual deficits: Secondary | ICD-10-CM | POA: Diagnosis not present

## 2020-12-20 DIAGNOSIS — I110 Hypertensive heart disease with heart failure: Secondary | ICD-10-CM | POA: Diagnosis not present

## 2020-12-20 DIAGNOSIS — Z96641 Presence of right artificial hip joint: Secondary | ICD-10-CM | POA: Insufficient documentation

## 2020-12-20 DIAGNOSIS — E039 Hypothyroidism, unspecified: Secondary | ICD-10-CM | POA: Insufficient documentation

## 2020-12-20 DIAGNOSIS — I5032 Chronic diastolic (congestive) heart failure: Secondary | ICD-10-CM | POA: Diagnosis not present

## 2020-12-20 DIAGNOSIS — R531 Weakness: Secondary | ICD-10-CM | POA: Insufficient documentation

## 2020-12-20 DIAGNOSIS — R5383 Other fatigue: Secondary | ICD-10-CM | POA: Diagnosis not present

## 2020-12-20 DIAGNOSIS — Z79899 Other long term (current) drug therapy: Secondary | ICD-10-CM | POA: Insufficient documentation

## 2020-12-20 DIAGNOSIS — Z96611 Presence of right artificial shoulder joint: Secondary | ICD-10-CM | POA: Insufficient documentation

## 2020-12-20 LAB — CBC
HCT: 41.6 % (ref 36.0–46.0)
Hemoglobin: 12.7 g/dL (ref 12.0–15.0)
MCH: 27.7 pg (ref 26.0–34.0)
MCHC: 30.5 g/dL (ref 30.0–36.0)
MCV: 90.8 fL (ref 80.0–100.0)
Platelets: 289 10*3/uL (ref 150–400)
RBC: 4.58 MIL/uL (ref 3.87–5.11)
RDW: 14.4 % (ref 11.5–15.5)
WBC: 7.1 10*3/uL (ref 4.0–10.5)
nRBC: 0 % (ref 0.0–0.2)

## 2020-12-20 LAB — COMPREHENSIVE METABOLIC PANEL
ALT: 12 U/L (ref 0–44)
AST: 19 U/L (ref 15–41)
Albumin: 2.9 g/dL — ABNORMAL LOW (ref 3.5–5.0)
Alkaline Phosphatase: 104 U/L (ref 38–126)
Anion gap: 10 (ref 5–15)
BUN: 11 mg/dL (ref 8–23)
CO2: 22 mmol/L (ref 22–32)
Calcium: 8.6 mg/dL — ABNORMAL LOW (ref 8.9–10.3)
Chloride: 101 mmol/L (ref 98–111)
Creatinine, Ser: 0.45 mg/dL (ref 0.44–1.00)
GFR, Estimated: >60 mL/min (ref >60)
Glucose, Bld: 106 mg/dL — ABNORMAL HIGH (ref 70–99)
Potassium: 4 mmol/L (ref 3.5–5.1)
Sodium: 133 mmol/L — ABNORMAL LOW (ref 135–145)
Total Bilirubin: 0.6 mg/dL (ref 0.3–1.2)
Total Protein: 6.6 g/dL (ref 6.5–8.1)

## 2020-12-20 LAB — URINALYSIS, ROUTINE W REFLEX MICROSCOPIC
Bilirubin Urine: NEGATIVE
Glucose, UA: NEGATIVE mg/dL
Ketones, ur: NEGATIVE mg/dL
Leukocytes,Ua: NEGATIVE
Nitrite: NEGATIVE
Protein, ur: NEGATIVE mg/dL
Specific Gravity, Urine: 1.015 (ref 1.005–1.030)
pH: 7.5 (ref 5.0–8.0)

## 2020-12-20 LAB — POC OCCULT BLOOD, ED: Fecal Occult Bld: NEGATIVE

## 2020-12-20 LAB — URINALYSIS, MICROSCOPIC (REFLEX)
Bacteria, UA: NONE SEEN
WBC, UA: NONE SEEN WBC/hpf (ref 0–5)

## 2020-12-20 NOTE — ED Notes (Signed)
Spoke w/ pt's daughter to let her know her mother was asking where she is. Pt's daughter reported she is running errands and that she assumed that we would be keeping the pt at least overnight d/t diarrhea and weakness. Explained to pt's daughter that if once we have done our work-up and everything looks fine, we can't just admit someone to the hospital if we don't have a reason to. Had to educated pt's daughter x 3 regarding this. Pt's daughter reported that if we discharge her, she will have to go home via transport because she can't come get her. Pt's daughter reports pt is mentally at baseline w/ orientation to self and intermittently to time and place. Pt's daughter reported she just took pt in from being in a nursing home 3 weeks ago. Assured pt's daughter that I would pass information along to the provider. Notified EDP.

## 2020-12-20 NOTE — ED Notes (Signed)
Ptar called pt is number 20

## 2020-12-20 NOTE — Discharge Instructions (Addendum)
No evidence of urinary tract infection is noted today. Chest x-Rache Klimaszewski is clear Labs are essentially normal except for mild low sodium but stable from prior. Please follow-up with your doctor this week. Return to the emergency department if you are having worsening symptoms especially high fever or other new symptoms.

## 2020-12-20 NOTE — ED Notes (Signed)
Patient aware that she is waiting on transport and it might take some time. Reassured we will be keeping an eye on her and taking care of her until she goes home.

## 2020-12-20 NOTE — ED Notes (Signed)
Patient provided with dinner.

## 2020-12-20 NOTE — ED Provider Notes (Signed)
[facility] EMERGENCY DEPARTMENT Provider Note   CSN: [id number] Arrival date & time: 12/20/20  0946     History Chief Complaint  Patient presents with   Weakness    Stephanie Donovan is a 87 y.o. female.  HPI 87 year old female presents today via EMS with reports that she is weak.  She is reported to have recent treatment for urinary tract infection but continues to have been weak and fatigued.  Patient reports that her daughter is unable to care for her due to this.  She denies any headache, head injury, chest pain, dyspnea, abdominal pain, nausea, vomiting.  She has had some loose stools.  She denies any pain or increased frequency of urination.    Past Medical History:  Diagnosis Date   Anemia    Blood transfusion without reported diagnosis    CHF (congestive heart failure) (HCC)    Chronic back pain    Chronic constipation    Cognitive communication deficit    Dysrhythmia 2015   Afib with RVR - 2015, Oaroxyomal Afib, no current meds due do GI Bleed   GI bleed    Glaucoma    HLD (hyperlipidemia)    Hypertension    Hypothyroidism    Stroke ([place]) 08/2014   mini stroke   Syncope 2015   Thyroid disease    TIA (transient ischemic attack) 2015    Patient Active Problem List   Diagnosis Date Noted   Above knee amputation of right lower extremity ([place]) 10/07/2020   Subacute osteomyelitis of right femur ([place])    Infected hardware in right leg, sequela    Hardware complicating wound infection ([place])    Cellulitis 07/27/2020   Supracondylar fracture of right femur, closed, initial encounter ([place]) 01/01/2018   Closed fracture of right distal femur ([place]) 12/24/2017   Acute blood loss anemia 12/19/2017   Malnutrition of moderate degree 12/18/2017   Femur fracture ([place]) 12/17/2017   Fall 12/17/2017   HLD (hyperlipidemia) 12/17/2017   Chronic anemia 12/17/2017   Closed 4-part fracture of proximal humerus, right, initial encounter 10/09/2017    Paroxysmal A-fib ([place]) 09/21/2017   Closed displaced fracture of right femoral neck ([place]) 09/21/2017   Closed fracture of right proximal humerus 09/21/2017   Dehydration 09/21/2017   Hyponatremia 09/21/2017   Elevated CK 09/21/2017   Thyroid nodule 09/21/2017   Closed right hip fracture ([place]) 09/21/2017   Chronic diastolic CHF (congestive heart failure) ([place]) 09/21/2017   Sinus tachycardia 09/21/2017   Acute lower UTI 09/21/2017   TIA (transient ischemic attack) 08/25/2014   Aphasia 08/25/2014   Chronic constipation 05/19/2014   Fracture of multiple pubic rami ([place]) 04/11/2014   Pubic bone fracture ([place]) 04/11/2014   Left hip pain 04/11/2014   Atrial fibrillation with RVR ([place]) 03/28/2013   Diarrhea 03/28/2013   Hypothyroidism 03/24/2013   Syncope 03/24/2013   Glaucoma 03/24/2013   Hypokalemia 03/24/2013   Acute gastroenteritis 03/24/2013    Past Surgical History:  Procedure Laterality Date   AMPUTATION Right 10/07/2020   Procedure: RIGHT ABOVE KNEE AMPUTATION;  Surgeon: Newt Minion, MD;  Location: [place];  Service: Orthopedics;  Laterality: Right;   CATARACT EXTRACTION, BILATERAL     COLONOSCOPY     HIP ARTHROPLASTY Right 09/22/2017   Procedure: ARTHROPLASTY BIPOLAR HIP (HEMIARTHROPLASTY);  Surgeon: [name], MD;  Location: [place];  Service: Orthopedics;  Laterality: Right;   JOINT REPLACEMENT  11/20/2011   LTKR   ORIF FEMUR FRACTURE Right 12/20/2017  Procedure: OPEN REDUCTION INTERNAL FIXATION (ORIF) DISTAL FEMUR FRACTURE;  Surgeon: [name], MD;  Location: [place];  Service: Orthopedics;  Laterality: Right;   REVERSE SHOULDER ARTHROPLASTY Right 10/09/2017   REVERSE SHOULDER ARTHROPLASTY Right 10/09/2017   Procedure: REVERSE SHOULDER ARTHROPLASTY;  Surgeon: [name], MD;  Location: [place];  Service: Orthopedics;  Laterality: Right;     OB History   No obstetric history on file.     Family History  Problem Relation Age of Onset   Osteoporosis  Mother    Cancer - Colon Father    Schizophrenia Son     Social History   Tobacco Use   Smoking status: Never   Smokeless tobacco: Never  Vaping Use   Vaping Use: Never used  Substance Use Topics   Alcohol use: No   Drug use: No    Home Medications Prior to Admission medications   Medication Sig Start Date End Date Taking? Authorizing Provider  Amino Acids-Protein Hydrolys (PRO-STAT) LIQD Take 30 mLs by mouth 3 (three) times daily.    [provider]  brimonidine (ALPHAGAN P) 0.1 % SOLN Place 1 drop into the left eye in the morning and at bedtime.    [provider]  ferrous sulfate 325 (65 FE) MG EC tablet Take 325 mg by mouth daily.    [provider]  furosemide (LASIX) 20 MG tablet Take 20 mg by mouth daily.    [provider]  levothyroxine (SYNTHROID) 50 MCG tablet Take 50 mcg by mouth daily before breakfast.    [provider]  magnesium oxide (MAG-OX) 400 (241.3 Mg) MG tablet Take 1 tablet (400 mg total) by mouth 2 (two) times daily. Patient taking differently: Take 400 mg by mouth daily. 11/30/19   Regalado, Belkys A, MD  metoprolol succinate (TOPROL-XL) 25 MG 24 hr tablet Take 25 mg by mouth daily as needed (Give 12.5 mg if HR > 99 bpm).    [provider]  metoprolol succinate (TOPROL-XL) 25 MG 24 hr tablet Take 1 tablet (25 mg total) by mouth daily. Patient not taking: Reported on 12/10/2020 08/03/20   Charlynne Cousins, MD  Multiple Vitamin-Folic Acid TABS Take 1 tablet by mouth daily. 0.4 mg    [provider]  oxyCODONE (OXY IR/ROXICODONE) 5 MG immediate release tablet Take 1 tablet (5 mg total) by mouth every 6 (six) hours as needed for severe pain. Patient not taking: Reported on 12/10/2020 10/10/20   Persons, Bevely Palmer, PA  timolol (TIMOPTIC) 0.25 % ophthalmic solution Place 1 drop into both eyes 2 (two) times daily. 12/22/19   [provider]  zinc sulfate 220 (50 Zn) MG capsule Take 1 capsule  (220 mg total) by mouth daily. Patient not taking: Reported on 12/10/2020 10/10/20   Persons, Bevely Palmer, [place]    Allergies    Aspirin, Penicillins, and Codeine  Review of Systems   Review of Systems  All other systems reviewed and are negative.  Physical Exam Updated Vital Signs BP 133/75   Pulse 84   Temp (!) 96.4 F (35.8 C) (Rectal) Comment: Ambreen Tufte MD aware  Resp 16   SpO2 96%   Physical Exam Vitals and nursing note reviewed.  Constitutional:      Appearance: Normal appearance.  HENT:     Head: Normocephalic.     Right Ear: External ear normal.     Left Ear: External ear normal.     Nose: Nose normal.     Mouth/Throat:  Pharynx: Oropharynx is clear.  Eyes:     Extraocular Movements: Extraocular movements intact.  Cardiovascular:     Rate and Rhythm: Normal rate and regular rhythm.     Pulses: Normal pulses.  Pulmonary:     Effort: Pulmonary effort is normal.  Abdominal:     General: Abdomen is flat.     Palpations: Abdomen is soft.  Musculoskeletal:     Cervical back: Normal range of motion.     Comments: Right lower remedy with AKA Stump appears intact with no redness or tenderness Lower extremity appears normal pulses intact Bilateral upper extremities appear normal with full active range of motion and pulses intact  Skin:    General: Skin is warm and dry.     Capillary Refill: Capillary refill takes less than 2 seconds.  Neurological:     General: No focal deficit present.     Mental Status: She is alert.     Cranial Nerves: No cranial nerve deficit.     Motor: No weakness.     Comments: Patient oriented to person and place but not to date  Psychiatric:        Mood and Affect: Mood normal.        Behavior: Behavior normal.    ED Results / Procedures / Treatments   Labs (all labs ordered are listed, but only abnormal results are displayed) Labs Reviewed  COMPREHENSIVE METABOLIC PANEL - Abnormal; Notable for the following components:      Result  Value   Sodium 133 (*)    Glucose, Bld 106 (*)    Calcium 8.6 (*)    Albumin 2.9 (*)    All other components within normal limits  URINALYSIS, ROUTINE W REFLEX MICROSCOPIC - Abnormal; Notable for the following components:   APPearance HAZY (*)    Hgb urine dipstick SMALL (*)    All other components within normal limits  URINALYSIS, MICROSCOPIC (REFLEX) - Abnormal; Notable for the following components:   Non Squamous Epithelial PRESENT (*)    All other components within normal limits  CBC  POC OCCULT BLOOD, ED  CBG MONITORING, ED    EKG EKG Interpretation  Date/Time:  Tuesday December 20 2020 10:07:45 EST Ventricular Rate:  85 PR Interval:  160 QRS Duration: 74 QT Interval:  382 QTC Calculation: 454 R Axis:   27 Text Interpretation: Normal sinus rhythm Low voltage QRS Borderline ECG Confirmed by Pattricia Boss [phone]) on 12/20/2020 2:43:43 PM  Radiology CT Head Wo Contrast  Result Date: 12/20/2020 CLINICAL DATA:  Altered mental status. EXAM: CT HEAD WITHOUT CONTRAST TECHNIQUE: Contiguous axial images were obtained from the base of the skull through the vertex without intravenous contrast. COMPARISON:  None. FINDINGS: Brain: Mild chronic ischemic white matter disease is noted. Mild diffuse cortical atrophy is noted. No mass effect or midline shift is noted. Ventricular size is within normal limits. There is no evidence of mass lesion, hemorrhage or acute infarction. Vascular: No hyperdense vessel or unexpected calcification. Skull: Normal. Negative for fracture or focal lesion. Sinuses/Orbits: No acute finding. Other: None. IMPRESSION: No acute intracranial abnormality seen. Electronically Signed   By: Marijo Conception M.D.   On: 12/20/2020 13:50   DG Chest Port 1 View  Result Date: 12/20/2020 CLINICAL DATA:  Altered mental status EXAM: PORTABLE CHEST 1 VIEW COMPARISON:  Radiograph 12/09/2020 FINDINGS: Unchanged cardiomediastinal silhouette. There is no focal airspace  consolidation. There is no large pleural effusion. No visible pneumothorax. There are skin folds overlying the chest  bilaterally. Prior reverse right shoulder arthroplasty. Mild left shoulder degenerative changes. Thoracic spondylosis. No acute osseous abnormality. There is a chronic L1 compression fracture and multiple thoracic compression deformities which are chronic. IMPRESSION: No evidence of acute cardiopulmonary disease. Electronically Signed   By: Maurine Simmering M.D.   On: 12/20/2020 13:32    Procedures Procedures   Medications Ordered in ED Medications - No data to display  ED Course  I have reviewed the triage vital signs and the nursing notes.  Pertinent labs & imaging results that were available during my care of the patient were reviewed by me and considered in my medical decision making (see chart for details).  Clinical Course as of 12/20/20 1445  Tue Dec 20, 2020  1443 Mild hyponatremia but stable from first prior CBC normal Catheterized urine does not appear to be infected Occult blood is negative  [DR]    Clinical Course User Index [DR] Pattricia Boss, MD   MDM Rules/Calculators/A&P                         Patient presents today with report of generalized weakness.  On exam here she has no focal deficits and appears to be at baseline. Labs reviewed per work-up section and no acute abnormalities noted. Patient is hemodynamically stable and appears to be stable for discharge. Final Clinical Impression(s) / ED Diagnoses Final diagnoses:  Weakness    Rx / DC Orders ED Discharge Orders     None        Pattricia Boss, MD 12/20/20 1445

## 2020-12-20 NOTE — ED Triage Notes (Signed)
Pt to triage via [facility] from home.  Daughter reports generalized weakness this morning.  Diagnosed with UTI last week.  Finished antibiotic yesterday.  Unable to stay awake to eat breakfast.  Pt alert to person and place.  CBG 157

## 2020-12-20 NOTE — ED Provider Notes (Signed)
Emergency Medicine Provider Triage Evaluation Note  Stephanie Donovan , a 87 y.o. female  was evaluated in triage. Sent in via EMS. Patient is oriented to person and place. Recently treated for a UTI but is still weak and fatigued per family. Unknown baseline orientation.   Review of Systems  Positive:  Negative:  Unable to obtain ROS due to patient's orientation.  Physical Exam  BP 125/76   Pulse 85   Temp 98.8 F (37.1 C) (Oral)   Resp 14   SpO2 94%  Gen:   Awake, no distress   Resp:  Normal effort  MSK:   AKA on right Other:  Abdomen non tender  Medical Decision Making  Medically screening exam initiated at 10:45 AM.  Appropriate orders placed.  KOBI ALLER was informed that the remainder of the evaluation will be completed by another provider, this initial triage assessment does not replace that evaluation, and the importance of remaining in the ED until their evaluation is complete.  AMS order set.    Sherrell Puller, PA-C 12/20/20 [place]    Carmin Muskrat, MD 12/24/20 [phone]

## 2020-12-20 NOTE — ED Notes (Signed)
Patient resting quietly in the bed awaiting transport home. Has asked several people that have walked by when she is going home. Patient reassured that they will be coming to pick her up soon.

## 2020-12-20 NOTE — ED Notes (Signed)
Patient resting with eyes closed in NAD. Continues to await transport home.

## 2020-12-26 ENCOUNTER — Ambulatory Visit (INDEPENDENT_AMBULATORY_CARE_PROVIDER_SITE_OTHER): Payer: Medicare Other | Admitting: Orthopedic Surgery

## 2020-12-26 DIAGNOSIS — S78111A Complete traumatic amputation at level between right hip and knee, initial encounter: Secondary | ICD-10-CM

## 2020-12-26 DIAGNOSIS — Z89612 Acquired absence of left leg above knee: Secondary | ICD-10-CM

## 2020-12-27 ENCOUNTER — Encounter: Payer: Self-pay | Admitting: Orthopedic Surgery

## 2020-12-27 NOTE — Progress Notes (Signed)
Office Visit Note   Patient: Stephanie Donovan           Date of Birth: October 10, 1927           MRN: [id number] Visit Date: 12/26/2020              Requested by: No referring provider defined for this encounter. PCP: System, Provider Not In  Chief Complaint  Patient presents with   Right Leg - Routine Post Op    Right AKA 10/07/20      HPI: Patient is a 87 year old woman who is 2-1/2 months status post right above-the-knee amputation she has no complaints.  Assessment & Plan: Visit Diagnoses:  1. Unilateral AKA, right (HCC)     Plan: Continue with current activities without restrictions no special wound care or dressing required.  Follow-Up Instructions: Return if symptoms worsen or fail to improve.   Ortho Exam  Patient is alert, oriented, no adenopathy, well-dressed, normal affect, normal respiratory effort. Examination the above-knee amputation on the right is completely healed there is no redness no cellulitis no drainage no open ulcer.  Imaging: No results found. No images are attached to the encounter.  Labs: Lab Results  Component Value Date   HGBA1C 5.5 08/26/2014   ESRSEDRATE 34 (H) 12/23/2011   CRP 0.7 (H) 04/14/2014   REPTSTATUS 12/13/2020 FINAL 12/10/2020   CULT 70,000 COLONIES/mL ESCHERICHIA COLI (A) 12/10/2020   LABORGA ESCHERICHIA COLI (A) 12/10/2020     Lab Results  Component Value Date   ALBUMIN 2.9 (L) 12/20/2020   ALBUMIN 2.9 (L) 12/10/2020   ALBUMIN 2.6 (L) 07/28/2020    Lab Results  Component Value Date   MG 1.8 10/10/2020   MG 2.0 07/30/2020   MG 1.9 07/28/2020   Lab Results  Component Value Date   VD25OH 47.0 09/22/2017    No results found for: PREALBUMIN CBC EXTENDED Latest Ref Rng & Units 12/20/2020 12/10/2020 10/10/2020  WBC 4.0 - 10.5 K/uL 7.1 11.4(H) 10.1  RBC 3.87 - 5.11 MIL/uL 4.58 4.71 2.74(L)  HGB 12.0 - 15.0 g/dL 12.7 12.7 7.8(L)  HCT 36.0 - 46.0 % 41.6 40.6 24.2(L)  PLT 150 - 400 K/uL 289 284 272  NEUTROABS  1.7 - 7.7 K/uL - 8.5(H) -  LYMPHSABS 0.7 - 4.0 K/uL - 1.5 -     There is no height or weight on file to calculate BMI.  Orders:  No orders of the defined types were placed in this encounter.  No orders of the defined types were placed in this encounter.    Procedures: No procedures performed  Clinical Data: No additional findings.  ROS:  All other systems negative, except as noted in the HPI. Review of Systems  Objective: Vital Signs: There were no vitals taken for this visit.  Specialty Comments:  No specialty comments available.  PMFS History: Patient Active Problem List   Diagnosis Date Noted   Above knee amputation of right lower extremity ([place]) 10/07/2020   Subacute osteomyelitis of right femur ([place])    Infected hardware in right leg, sequela    Hardware complicating wound infection ([place])    Cellulitis 07/27/2020   Supracondylar fracture of right femur, closed, initial encounter ([place]) 01/01/2018   Closed fracture of right distal femur ([place]) 12/24/2017   Acute blood loss anemia 12/19/2017   Malnutrition of moderate degree 12/18/2017   Femur fracture ([place]) 12/17/2017   Fall 12/17/2017   HLD (hyperlipidemia) 12/17/2017   Chronic anemia 12/17/2017   Closed 4-part fracture  of proximal humerus, right, initial encounter 10/09/2017   Paroxysmal A-fib ([place]) 09/21/2017   Closed displaced fracture of right femoral neck ([place]) 09/21/2017   Closed fracture of right proximal humerus 09/21/2017   Dehydration 09/21/2017   Hyponatremia 09/21/2017   Elevated CK 09/21/2017   Thyroid nodule 09/21/2017   Closed right hip fracture ([place]) 09/21/2017   Chronic diastolic CHF (congestive heart failure) ([place]) 09/21/2017   Sinus tachycardia 09/21/2017   Acute lower UTI 09/21/2017   TIA (transient ischemic attack) 08/25/2014   Aphasia 08/25/2014   Chronic constipation 05/19/2014   Fracture of multiple pubic rami ([place]) 04/11/2014   Pubic bone fracture ([place]) 04/11/2014   Left hip  pain 04/11/2014   Atrial fibrillation with RVR ([place]) 03/28/2013   Diarrhea 03/28/2013   Hypothyroidism 03/24/2013   Syncope 03/24/2013   Glaucoma 03/24/2013   Hypokalemia 03/24/2013   Acute gastroenteritis 03/24/2013   Past Medical History:  Diagnosis Date   Anemia    Blood transfusion without reported diagnosis    CHF (congestive heart failure) ([place])    Chronic back pain    Chronic constipation    Cognitive communication deficit    Dysrhythmia 2015   Afib with RVR - 2015, Oaroxyomal Afib, no current meds due do GI Bleed   GI bleed    Glaucoma    HLD (hyperlipidemia)    Hypertension    Hypothyroidism    Stroke ([place]) 08/2014   mini stroke   Syncope 2015   Thyroid disease    TIA (transient ischemic attack) 2015    Family History  Problem Relation Age of Onset   Osteoporosis Mother    Cancer - Colon Father    Schizophrenia Son     Past Surgical History:  Procedure Laterality Date   AMPUTATION Right 10/07/2020   Procedure: RIGHT ABOVE KNEE AMPUTATION;  Surgeon: Newt Minion, MD;  Location: [place];  Service: Orthopedics;  Laterality: Right;   CATARACT EXTRACTION, BILATERAL     COLONOSCOPY     HIP ARTHROPLASTY Right 09/22/2017   Procedure: ARTHROPLASTY BIPOLAR HIP (HEMIARTHROPLASTY);  Surgeon: [name], MD;  Location: [place];  Service: Orthopedics;  Laterality: Right;   JOINT REPLACEMENT  11/20/2011   LTKR   ORIF FEMUR FRACTURE Right 12/20/2017   Procedure: OPEN REDUCTION INTERNAL FIXATION (ORIF) DISTAL FEMUR FRACTURE;  Surgeon: [name], MD;  Location: [place];  Service: Orthopedics;  Laterality: Right;   REVERSE SHOULDER ARTHROPLASTY Right 10/09/2017   REVERSE SHOULDER ARTHROPLASTY Right 10/09/2017   Procedure: REVERSE SHOULDER ARTHROPLASTY;  Surgeon: [name], MD;  Location: [place];  Service: Orthopedics;  Laterality: Right;   Social History   Occupational History   Not on file  Tobacco Use   Smoking status: Never   Smokeless tobacco: Never   Vaping Use   Vaping Use: Never used  Substance and Sexual Activity   Alcohol use: No   Drug use: No   Sexual activity: Not Currently    Birth control/protection: Post-menopausal

## 2021-03-24 ENCOUNTER — Other Ambulatory Visit: Payer: Self-pay

## 2021-03-24 ENCOUNTER — Encounter (HOSPITAL_COMMUNITY): Payer: Self-pay

## 2021-03-24 ENCOUNTER — Emergency Department (HOSPITAL_COMMUNITY): Payer: Medicare Other

## 2021-03-24 ENCOUNTER — Emergency Department (HOSPITAL_COMMUNITY)
Admission: EM | Admit: 2021-03-24 | Discharge: 2021-03-25 | Disposition: A | Discharge status: Home / Self Care | Payer: Medicare Other | Attending: Emergency Medicine | Admitting: Emergency Medicine

## 2021-03-24 DIAGNOSIS — Z8673 Personal history of transient ischemic attack (TIA), and cerebral infarction without residual deficits: Secondary | ICD-10-CM | POA: Diagnosis not present

## 2021-03-24 DIAGNOSIS — Z79899 Other long term (current) drug therapy: Secondary | ICD-10-CM | POA: Insufficient documentation

## 2021-03-24 DIAGNOSIS — I110 Hypertensive heart disease with heart failure: Secondary | ICD-10-CM | POA: Insufficient documentation

## 2021-03-24 DIAGNOSIS — Z89611 Acquired absence of right leg above knee: Secondary | ICD-10-CM | POA: Insufficient documentation

## 2021-03-24 DIAGNOSIS — N3000 Acute cystitis without hematuria: Secondary | ICD-10-CM

## 2021-03-24 DIAGNOSIS — I509 Heart failure, unspecified: Secondary | ICD-10-CM | POA: Diagnosis not present

## 2021-03-24 DIAGNOSIS — F0390 Unspecified dementia without behavioral disturbance: Secondary | ICD-10-CM | POA: Diagnosis not present

## 2021-03-24 DIAGNOSIS — N3090 Cystitis, unspecified without hematuria: Secondary | ICD-10-CM | POA: Diagnosis not present

## 2021-03-24 DIAGNOSIS — R600 Localized edema: Secondary | ICD-10-CM | POA: Insufficient documentation

## 2021-03-24 DIAGNOSIS — R350 Frequency of micturition: Secondary | ICD-10-CM | POA: Diagnosis present

## 2021-03-24 LAB — CBC WITH DIFFERENTIAL/PLATELET
Abs Immature Granulocytes: 0.02 10*3/uL (ref 0.00–0.07)
Basophils Absolute: 0.1 10*3/uL (ref 0.0–0.1)
Basophils Relative: 2 %
Eosinophils Absolute: 0.2 10*3/uL (ref 0.0–0.5)
Eosinophils Relative: 2 %
HCT: 38.7 % (ref 36.0–46.0)
Hemoglobin: 12.3 g/dL (ref 12.0–15.0)
Immature Granulocytes: 0 %
Lymphocytes Relative: 19 %
Lymphs Abs: 1.6 10*3/uL (ref 0.7–4.0)
MCH: 27.5 pg (ref 26.0–34.0)
MCHC: 31.8 g/dL (ref 30.0–36.0)
MCV: 86.4 fL (ref 80.0–100.0)
Monocytes Absolute: 0.8 10*3/uL (ref 0.1–1.0)
Monocytes Relative: 9 %
Neutro Abs: 6 10*3/uL (ref 1.7–7.7)
Neutrophils Relative %: 68 %
Platelets: 286 10*3/uL (ref 150–400)
RBC: 4.48 MIL/uL (ref 3.87–5.11)
RDW: 14.6 % (ref 11.5–15.5)
WBC: 8.8 10*3/uL (ref 4.0–10.5)
nRBC: 0 % (ref 0.0–0.2)

## 2021-03-24 LAB — URINALYSIS, ROUTINE W REFLEX MICROSCOPIC
Bilirubin Urine: NEGATIVE
Glucose, UA: NEGATIVE mg/dL
Ketones, ur: NEGATIVE mg/dL
Nitrite: NEGATIVE
Specific Gravity, Urine: 1.02 (ref 1.005–1.030)
pH: 6 (ref 5.0–8.0)

## 2021-03-24 LAB — URINALYSIS, MICROSCOPIC (REFLEX): WBC, UA: >50 WBC/hpf (ref 0–5)

## 2021-03-24 LAB — COMPREHENSIVE METABOLIC PANEL
ALT: 18 U/L (ref 0–44)
AST: 15 U/L (ref 15–41)
Albumin: 3.3 g/dL — ABNORMAL LOW (ref 3.5–5.0)
Alkaline Phosphatase: 99 U/L (ref 38–126)
Anion gap: 7 (ref 5–15)
BUN: 19 mg/dL (ref 8–23)
CO2: 30 mmol/L (ref 22–32)
Calcium: 8.6 mg/dL — ABNORMAL LOW (ref 8.9–10.3)
Chloride: 95 mmol/L — ABNORMAL LOW (ref 98–111)
Creatinine, Ser: 0.46 mg/dL (ref 0.44–1.00)
GFR, Estimated: >60 mL/min (ref >60)
Glucose, Bld: 102 mg/dL — ABNORMAL HIGH (ref 70–99)
Potassium: 3.8 mmol/L (ref 3.5–5.1)
Sodium: 132 mmol/L — ABNORMAL LOW (ref 135–145)
Total Bilirubin: 0.1 mg/dL — ABNORMAL LOW (ref 0.3–1.2)
Total Protein: 6.8 g/dL (ref 6.5–8.1)

## 2021-03-24 MED ORDER — SODIUM CHLORIDE 0.9 % IV SOLN: INTRAVENOUS | Status: DC

## 2021-03-24 MED ORDER — CEFTRIAXONE SODIUM 1 G IJ SOLR: 1.0000 g | Freq: Once | INTRAMUSCULAR | 0 refills | Status: AC

## 2021-03-24 MED ORDER — CEPHALEXIN 500 MG PO CAPS: 500.0000 mg | ORAL_CAPSULE | Freq: Four times a day (QID) | ORAL | 0 refills | Status: DC

## 2021-03-24 NOTE — Discharge Instructions (Addendum)
Take the antibiotic Keflex as directed for the next 7 days.  Prescription provided.  You can take it to any pharmacy to get it filled.  He will start taking this medication on Sunday.  Would expect improvement over 2 days.  Return if not better or for any new or worse symptoms. ?

## 2021-03-24 NOTE — ED Provider Notes (Addendum)
?[place] DEPT ?Provider Note ? ? ?CSN: [id number] ?Arrival date & time: 03/24/21  1618 ? ?  ? ?History ? ?Chief Complaint  ?Patient presents with  ? Urinary Frequency  ? Weakness  ? ? ?Stephanie Donovan is a 88 y.o. female. ? ?Patient brought in by EMS from home.  Lives with her daughter.  Daughter reported frequent urination foul-smelling urine over the past few days.  Family also states that patient is more sluggish than normal.  Does have a history of dementia.  Temp here is 97.6.  Patient not able to relay any of these complaints or concerns. ? ?Past medical history significant for thyroid disease chronic back pain TIAs in 2015 dysrhythmia in 2015 A-fib with RVR.  Chronic constipation mini stroke in 2016 hypertension congestive heart failure in the history of hyperlipidemia and dementia.  In addition patient has had a right above-the-knee amputation.  Patient is not on a blood thinner. ? ? ?  ? ?Home Medications ?Prior to Admission medications   ?Medication Sig Start Date End Date Taking? Authorizing Provider  ?cefTRIAXone (ROCEPHIN) 1 g injection Inject 1 g into the muscle once for 1 dose. 03/24/21 03/24/21 Yes Fredia Sorrow, MD  ?Amino Acids-Protein Hydrolys (PRO-STAT) LIQD Take 30 mLs by mouth 3 (three) times daily.    [provider]  ?brimonidine (ALPHAGAN P) 0.1 % SOLN Place 1 drop into the left eye in the morning and at bedtime.    [provider]  ?ferrous sulfate 325 (65 FE) MG EC tablet Take 325 mg by mouth daily.    [provider]  ?furosemide (LASIX) 20 MG tablet Take 20 mg by mouth daily.    [provider]  ?levothyroxine (SYNTHROID) 50 MCG tablet Take 50 mcg by mouth daily before breakfast.    [provider]  ?magnesium oxide (MAG-OX) 400 (241.3 Mg) MG tablet Take 1 tablet (400 mg total) by mouth 2 (two) times daily. ?Patient taking differently: Take 400 mg by mouth daily. 11/30/19   Regalado, Belkys A, MD  ?metoprolol  succinate (TOPROL-XL) 25 MG 24 hr tablet Take 25 mg by mouth daily as needed (Give 12.5 mg if HR > 99 bpm).    [provider]  ?metoprolol succinate (TOPROL-XL) 25 MG 24 hr tablet Take 1 tablet (25 mg total) by mouth daily. ?Patient not taking: Reported on 12/10/2020 08/03/20   Charlynne Cousins, MD  ?Multiple Vitamin-Folic Acid TABS Take 1 tablet by mouth daily. 0.4 mg    [provider]  ?oxyCODONE (OXY IR/ROXICODONE) 5 MG immediate release tablet Take 1 tablet (5 mg total) by mouth every 6 (six) hours as needed for severe pain. ?Patient not taking: Reported on 12/10/2020 10/10/20   Persons, Bevely Palmer, [place]  ?timolol (TIMOPTIC) 0.25 % ophthalmic solution Place 1 drop into both eyes 2 (two) times daily. 12/22/19   [provider]  ?zinc sulfate 220 (50 Zn) MG capsule Take 1 capsule (220 mg total) by mouth daily. ?Patient not taking: Reported on 12/10/2020 10/10/20   Persons, Bevely Palmer, [place]  ?   ? ?Allergies    ?Aspirin, Penicillins, and Codeine   ? ?Review of Systems   ?Review of Systems  ?Unable to perform ROS: Dementia  ? ?Physical Exam ?Updated Vital Signs ?BP 113/73 (BP Location: Left Arm)   Pulse 80   Temp 97.6 ?F (36.4 ?C) (Oral)   Resp 16   SpO2 94%  ?Physical Exam ?Vitals and nursing note reviewed.  ?Constitutional:   ?  General: She is not in acute distress. ?   Appearance: She is well-developed.  ?HENT:  ?   Head: Normocephalic and atraumatic.  ?   Mouth/Throat:  ?   Mouth: Mucous membranes are dry.  ?Eyes:  ?   Conjunctiva/sclera: Conjunctivae normal.  ?Cardiovascular:  ?   Rate and Rhythm: Normal rate and regular rhythm.  ?   Heart sounds: No murmur heard. ?Pulmonary:  ?   Effort: Pulmonary effort is normal. No respiratory distress.  ?   Breath sounds: Normal breath sounds.  ?Abdominal:  ?   Palpations: Abdomen is soft.  ?   Tenderness: There is no abdominal tenderness.  ?Musculoskeletal:     ?   General: No swelling.  ?   Cervical back: Normal range of motion and neck  supple.  ?   Left lower leg: Edema present.  ?   Comments: Right leg with above-the-knee amputation  ?Skin: ?   General: Skin is warm and dry.  ?   Capillary Refill: Capillary refill takes less than 2 seconds.  ?Neurological:  ?   Mental Status: She is alert. Mental status is at baseline.  ?Psychiatric:     ?   Mood and Affect: Mood normal.  ? ? ?ED Results / Procedures / Treatments   ?Labs ?(all labs ordered are listed, but only abnormal results are displayed) ?Labs Reviewed  ?URINALYSIS, ROUTINE W REFLEX MICROSCOPIC - Abnormal; Notable for the following components:  ?    Result Value  ? APPearance TURBID (*)   ? Hgb urine dipstick MODERATE (*)   ? Protein, ur TRACE (*)   ? Leukocytes,Ua LARGE (*)   ? All other components within normal limits  ?COMPREHENSIVE METABOLIC PANEL - Abnormal; Notable for the following components:  ? Sodium 132 (*)   ? Chloride 95 (*)   ? Glucose, Bld 102 (*)   ? Calcium 8.6 (*)   ? Albumin 3.3 (*)   ? Total Bilirubin 0.1 (*)   ? All other components within normal limits  ?URINALYSIS, MICROSCOPIC (REFLEX) - Abnormal; Notable for the following components:  ? Bacteria, UA MANY (*)   ? All other components within normal limits  ?URINE CULTURE  ?CBC WITH DIFFERENTIAL/PLATELET  ? ? ?EKG ?EKG Interpretation ? ?Date/Time:  Friday March 24 2021 17:13:08 EST ?Ventricular Rate:  81 ?PR Interval:  166 ?QRS Duration: 88 ?QT Interval:  398 ?QTC Calculation: 462 ?R Axis:   15 ?Text Interpretation: Sinus rhythm Low voltage, precordial leads RSR' in V1 or V2, right VCD or RVH No significant change since last tracing Confirmed by Fredia Sorrow [phone]) on 03/24/2021 5:45:38 PM ? ?Radiology ?DG Chest Port 1 View ? ?Result Date: 03/24/2021 ?CLINICAL DATA:  Weakness. EXAM: PORTABLE CHEST 1 VIEW COMPARISON:  One-view chest x-ray 12/20/2020 FINDINGS: The heart is enlarged. Atherosclerotic calcifications are present at the aortic arch. Changes of COPD are noted. No edema or effusion is present. No focal airspace  disease is present. Right shoulder arthroplasty noted. IMPRESSION: 1. Cardiomegaly without failure. 2. Changes of COPD. Electronically Signed   By: San Morelle M.D.   On: 03/24/2021 17:16   ? ?Procedures ?Procedures  ? ? ?Medications Ordered in ED ?Medications  ?0.9 %  sodium chloride infusion ( Intravenous New Bag/Given 03/24/21 1750)  ? ? ?ED Course/ Medical Decision Making/ A&P ?  ?                        ?Medical Decision Making ?Amount  and/or Complexity of Data Reviewed ?Labs: ordered. ?Radiology: ordered. ? ?Risk ?Prescription drug management. ? ? ?Chest x-ray shows cardiomegaly without evidence of failure changes of COPD. ? ?EKG does not show evidence of atrial fibrillation. ? ?Patient's urinalysis consistent with urinary tract infection.  Sent for urine culture.  Will start patient on cephalosporin.  Will give first dose of Rocephin here to jumpstart things for her and then continue her on Keflex.  Patient does have a penicillin allergy.  But the reaction is unknown and it was not serious.  So patient is cleared based on the checkless to receive cephalosporins. ? ?CBC no leukocytosis hemoglobin 12.3.  Complete metabolic panel significant for sodium being at 132 potassium good at 3.8.  Renal function GFR is greater than 60.  Chest x-ray as mentioned above. ? ?Patient did receive some IV fluids here.  Because she did appear to be dehydrated.  Although renal functions shows no significant dehydration.  Sodium was down a little bit.  But the normal saline she received here should help that. ? ?Patient will be cleared for discharge home. ? ?Spoke with patient's daughter she is clear on the plan and the findings.  She is unable to drive so she will need to have patient transported home. ? ? ?Final Clinical Impression(s) / ED Diagnoses ?Final diagnoses:  ?Acute cystitis without hematuria  ? ? ?Rx / DC Orders ?ED Discharge Orders   ? ?      Ordered  ?  cefTRIAXone (ROCEPHIN) 1 g injection   Once       ?  03/24/21 1853  ? ?  ?  ? ?  ? ? ?  ?Fredia Sorrow, MD ?03/24/21 1856 ? ?  ?Fredia Sorrow, MD ?03/24/21 1924 ? ?

## 2021-03-24 NOTE — ED Triage Notes (Signed)
R AKA

## 2021-03-24 NOTE — ED Notes (Signed)
Pt found sitting on floor trying to get pants on.  Pt assisted back to bed and  bed linens changed; pt encouraged to use call bell for assistance; pt verbalized understanding ?

## 2021-03-24 NOTE — ED Notes (Signed)
IV attempted 2x by this nurse. Additional nurse called in to attempt. ?

## 2021-03-24 NOTE — ED Triage Notes (Signed)
Pt BIB EMS from home. Pts daughter reported frequent urination and foul smelling urine over the past few days. Pts family reports more sluggish than normal. Hx of dementia.  ? ?BP 121/67 ?HR 73 ?95% RA ? ?

## 2021-03-24 NOTE — ED Notes (Signed)
Pt requesting to speak with her daughter; this RN called daughter and pt spoke with her; pt encouraged to stay in bed and call out if she needs anything; lights cut down for pt comfort ?

## 2021-03-25 NOTE — ED Notes (Signed)
[facility] here for transport. 

## 2021-03-25 NOTE — ED Notes (Signed)
Pt changed into a gown due to incontinence of urine ?

## 2021-03-25 NOTE — ED Notes (Signed)
Pt given sandwich 

## 2021-06-01 ENCOUNTER — Emergency Department (HOSPITAL_BASED_OUTPATIENT_CLINIC_OR_DEPARTMENT_OTHER): Payer: Medicare Other

## 2021-06-01 ENCOUNTER — Other Ambulatory Visit: Payer: Self-pay

## 2021-06-01 ENCOUNTER — Encounter (HOSPITAL_COMMUNITY): Payer: Self-pay | Admitting: Internal Medicine

## 2021-06-01 ENCOUNTER — Observation Stay (HOSPITAL_COMMUNITY): Payer: Medicare Other

## 2021-06-01 ENCOUNTER — Inpatient Hospital Stay (HOSPITAL_BASED_OUTPATIENT_CLINIC_OR_DEPARTMENT_OTHER)
Admission: EM | Admit: 2021-06-01 | Discharge: 2021-06-11 | DRG: 178 | Disposition: A | Discharge status: Home Health | Payer: Medicare Other | Attending: Internal Medicine | Admitting: Internal Medicine

## 2021-06-01 DIAGNOSIS — I5032 Chronic diastolic (congestive) heart failure: Secondary | ICD-10-CM | POA: Diagnosis not present

## 2021-06-01 DIAGNOSIS — H409 Unspecified glaucoma: Secondary | ICD-10-CM | POA: Diagnosis not present

## 2021-06-01 DIAGNOSIS — Z79899 Other long term (current) drug therapy: Secondary | ICD-10-CM

## 2021-06-01 DIAGNOSIS — E041 Nontoxic single thyroid nodule: Secondary | ICD-10-CM | POA: Diagnosis present

## 2021-06-01 DIAGNOSIS — N390 Urinary tract infection, site not specified: Secondary | ICD-10-CM | POA: Diagnosis not present

## 2021-06-01 DIAGNOSIS — Z66 Do not resuscitate: Secondary | ICD-10-CM | POA: Diagnosis not present

## 2021-06-01 DIAGNOSIS — Z9181 History of falling: Secondary | ICD-10-CM | POA: Diagnosis not present

## 2021-06-01 DIAGNOSIS — D649 Anemia, unspecified: Secondary | ICD-10-CM | POA: Diagnosis present

## 2021-06-01 DIAGNOSIS — S78111A Complete traumatic amputation at level between right hip and knee, initial encounter: Secondary | ICD-10-CM | POA: Diagnosis not present

## 2021-06-01 DIAGNOSIS — Z885 Allergy status to narcotic agent status: Secondary | ICD-10-CM

## 2021-06-01 DIAGNOSIS — Z993 Dependence on wheelchair: Secondary | ICD-10-CM

## 2021-06-01 DIAGNOSIS — R41841 Cognitive communication deficit: Secondary | ICD-10-CM | POA: Diagnosis not present

## 2021-06-01 DIAGNOSIS — R32 Unspecified urinary incontinence: Secondary | ICD-10-CM | POA: Diagnosis not present

## 2021-06-01 DIAGNOSIS — E860 Dehydration: Secondary | ICD-10-CM

## 2021-06-01 DIAGNOSIS — D2272 Melanocytic nevi of left lower limb, including hip: Secondary | ICD-10-CM

## 2021-06-01 DIAGNOSIS — K5909 Other constipation: Secondary | ICD-10-CM | POA: Diagnosis not present

## 2021-06-01 DIAGNOSIS — U071 COVID-19: Principal | ICD-10-CM

## 2021-06-01 DIAGNOSIS — E872 Acidosis, unspecified: Secondary | ICD-10-CM | POA: Diagnosis present

## 2021-06-01 DIAGNOSIS — Z87440 Personal history of urinary (tract) infections: Secondary | ICD-10-CM

## 2021-06-01 DIAGNOSIS — Z880 Allergy status to penicillin: Secondary | ICD-10-CM

## 2021-06-01 DIAGNOSIS — I110 Hypertensive heart disease with heart failure: Secondary | ICD-10-CM | POA: Diagnosis not present

## 2021-06-01 DIAGNOSIS — I480 Paroxysmal atrial fibrillation: Secondary | ICD-10-CM | POA: Diagnosis not present

## 2021-06-01 DIAGNOSIS — F0390 Unspecified dementia without behavioral disturbance: Secondary | ICD-10-CM | POA: Diagnosis present

## 2021-06-01 DIAGNOSIS — E039 Hypothyroidism, unspecified: Secondary | ICD-10-CM

## 2021-06-01 DIAGNOSIS — E785 Hyperlipidemia, unspecified: Secondary | ICD-10-CM | POA: Diagnosis not present

## 2021-06-01 DIAGNOSIS — R55 Syncope and collapse: Secondary | ICD-10-CM | POA: Diagnosis present

## 2021-06-01 DIAGNOSIS — Z79890 Hormone replacement therapy (postmenopausal): Secondary | ICD-10-CM | POA: Diagnosis not present

## 2021-06-01 DIAGNOSIS — E871 Hypo-osmolality and hyponatremia: Secondary | ICD-10-CM

## 2021-06-01 DIAGNOSIS — Z8673 Personal history of transient ischemic attack (TIA), and cerebral infarction without residual deficits: Secondary | ICD-10-CM

## 2021-06-01 DIAGNOSIS — B9620 Unspecified Escherichia coli [E. coli] as the cause of diseases classified elsewhere: Secondary | ICD-10-CM | POA: Diagnosis not present

## 2021-06-01 DIAGNOSIS — Z886 Allergy status to analgesic agent status: Secondary | ICD-10-CM

## 2021-06-01 DIAGNOSIS — Z89611 Acquired absence of right leg above knee: Secondary | ICD-10-CM

## 2021-06-01 DIAGNOSIS — J189 Pneumonia, unspecified organism: Secondary | ICD-10-CM

## 2021-06-01 LAB — BASIC METABOLIC PANEL
Anion gap: 7 (ref 5–15)
BUN: 13 mg/dL (ref 8–23)
CO2: 29 mmol/L (ref 22–32)
Calcium: 8.5 mg/dL — ABNORMAL LOW (ref 8.9–10.3)
Chloride: 98 mmol/L (ref 98–111)
Creatinine, Ser: 0.41 mg/dL — ABNORMAL LOW (ref 0.44–1.00)
GFR, Estimated: >60 mL/min (ref >60)
Glucose, Bld: 105 mg/dL — ABNORMAL HIGH (ref 70–99)
Potassium: 4 mmol/L (ref 3.5–5.1)
Sodium: 134 mmol/L — ABNORMAL LOW (ref 135–145)

## 2021-06-01 LAB — TSH: TSH: 0.679 u[IU]/mL (ref 0.350–4.500)

## 2021-06-01 LAB — URINALYSIS, ROUTINE W REFLEX MICROSCOPIC
Bilirubin Urine: NEGATIVE
Glucose, UA: NEGATIVE mg/dL
Ketones, ur: NEGATIVE mg/dL
Nitrite: POSITIVE — AB
Protein, ur: NEGATIVE mg/dL
Specific Gravity, Urine: 1.015 (ref 1.005–1.030)
pH: 5 (ref 5.0–8.0)

## 2021-06-01 LAB — COMPREHENSIVE METABOLIC PANEL
ALT: 15 U/L (ref 0–44)
AST: 16 U/L (ref 15–41)
Albumin: 3.7 g/dL (ref 3.5–5.0)
Alkaline Phosphatase: 113 U/L (ref 38–126)
Anion gap: 10 (ref 5–15)
BUN: 15 mg/dL (ref 8–23)
CO2: 25 mmol/L (ref 22–32)
Calcium: 9.2 mg/dL (ref 8.9–10.3)
Chloride: 97 mmol/L — ABNORMAL LOW (ref 98–111)
Creatinine, Ser: 0.49 mg/dL (ref 0.44–1.00)
GFR, Estimated: >60 mL/min (ref >60)
Glucose, Bld: 126 mg/dL — ABNORMAL HIGH (ref 70–99)
Potassium: 3.9 mmol/L (ref 3.5–5.1)
Sodium: 132 mmol/L — ABNORMAL LOW (ref 135–145)
Total Bilirubin: 0.4 mg/dL (ref 0.3–1.2)
Total Protein: 7 g/dL (ref 6.5–8.1)

## 2021-06-01 LAB — CBC WITH DIFFERENTIAL/PLATELET
Abs Immature Granulocytes: 0.03 10*3/uL (ref 0.00–0.07)
Basophils Absolute: 0.1 10*3/uL (ref 0.0–0.1)
Basophils Relative: 2 %
Eosinophils Absolute: 0.1 10*3/uL (ref 0.0–0.5)
Eosinophils Relative: 1 %
HCT: 40.8 % (ref 36.0–46.0)
Hemoglobin: 13.4 g/dL (ref 12.0–15.0)
Immature Granulocytes: 0 %
Lymphocytes Relative: 19 %
Lymphs Abs: 1.6 10*3/uL (ref 0.7–4.0)
MCH: 28.2 pg (ref 26.0–34.0)
MCHC: 32.8 g/dL (ref 30.0–36.0)
MCV: 85.7 fL (ref 80.0–100.0)
Monocytes Absolute: 0.9 10*3/uL (ref 0.1–1.0)
Monocytes Relative: 10 %
Neutro Abs: 5.7 10*3/uL (ref 1.7–7.7)
Neutrophils Relative %: 68 %
Platelets: 258 10*3/uL (ref 150–400)
RBC: 4.76 MIL/uL (ref 3.87–5.11)
RDW: 14 % (ref 11.5–15.5)
WBC: 8.4 10*3/uL (ref 4.0–10.5)
nRBC: 0 % (ref 0.0–0.2)

## 2021-06-01 LAB — CK: Total CK: 17 U/L — ABNORMAL LOW (ref 38–234)

## 2021-06-01 LAB — D-DIMER, QUANTITATIVE: D-Dimer, Quant: 2.03 ug/mL-FEU — ABNORMAL HIGH (ref 0.00–0.50)

## 2021-06-01 LAB — LACTATE DEHYDROGENASE: LDH: 100 U/L (ref 98–192)

## 2021-06-01 LAB — OSMOLALITY: Osmolality: 281 mOsm/kg (ref 275–295)

## 2021-06-01 LAB — MAGNESIUM
Magnesium: 1.9 mg/dL (ref 1.7–2.4)
Magnesium: 1.8 mg/dL (ref 1.7–2.4)

## 2021-06-01 LAB — TROPONIN I (HIGH SENSITIVITY)
Troponin I (High Sensitivity): 4 ng/L (ref <18)
Troponin I (High Sensitivity): 3 ng/L (ref <18)

## 2021-06-01 LAB — FERRITIN: Ferritin: 99 ng/mL (ref 11–307)

## 2021-06-01 LAB — LACTIC ACID, PLASMA
Lactic Acid, Venous: 2 mmol/L (ref 0.5–1.9)
Lactic Acid, Venous: 1.1 mmol/L (ref 0.5–1.9)

## 2021-06-01 LAB — RESP PANEL BY RT-PCR (FLU A&B, COVID) ARPGX2
Influenza A by PCR: NEGATIVE
Influenza B by PCR: NEGATIVE
SARS Coronavirus 2 by RT PCR: POSITIVE — AB

## 2021-06-01 LAB — PHOSPHORUS: Phosphorus: 3.3 mg/dL (ref 2.5–4.6)

## 2021-06-01 LAB — CBG MONITORING, ED: Glucose-Capillary: 125 mg/dL — ABNORMAL HIGH (ref 70–99)

## 2021-06-01 LAB — PROCALCITONIN: Procalcitonin: <0.1 ng/mL

## 2021-06-01 LAB — C-REACTIVE PROTEIN: CRP: 1.1 mg/dL — ABNORMAL HIGH (ref <1.0)

## 2021-06-01 MED ORDER — ACETAMINOPHEN 325 MG PO TABS
650.0000 mg | ORAL_TABLET | Freq: Four times a day (QID) | ORAL | Status: DC | PRN
  Administered 2021-06-09: 650 mg via ORAL
  Filled 2021-06-01: qty 2

## 2021-06-01 MED ORDER — ONDANSETRON HCL 4 MG/2ML IJ SOLN
4.0000 mg | Freq: Once | INTRAMUSCULAR | Status: AC
  Administered 2021-06-01: 4 mg via INTRAVENOUS
  Filled 2021-06-01: qty 2

## 2021-06-01 MED ORDER — ACETAMINOPHEN 650 MG RE SUPP: 650.0000 mg | Freq: Four times a day (QID) | RECTAL | Status: DC | PRN

## 2021-06-01 MED ORDER — NIRMATRELVIR/RITONAVIR (PAXLOVID)TABLET
3.0000 | ORAL_TABLET | Freq: Two times a day (BID) | ORAL | Status: DC
Start: 2021-06-01 — End: 2021-06-01

## 2021-06-01 MED ORDER — SODIUM CHLORIDE 0.9 % IV SOLN
75.0000 mL/h | INTRAVENOUS | Status: AC
  Administered 2021-06-01: 75 mL/h via INTRAVENOUS

## 2021-06-01 MED ORDER — SODIUM CHLORIDE 0.9 % IV SOLN
1.0000 g | INTRAVENOUS | Status: AC
  Administered 2021-06-02 – 2021-06-05 (×4): 1 g via INTRAVENOUS
  Filled 2021-06-01 (×4): qty 10

## 2021-06-01 MED ORDER — LEVOTHYROXINE SODIUM 50 MCG PO TABS
50.0000 ug | ORAL_TABLET | Freq: Every day | ORAL | Status: DC
  Administered 2021-06-02 – 2021-06-10 (×9): 50 ug via ORAL
  Filled 2021-06-01 (×9): qty 1

## 2021-06-01 MED ORDER — ENOXAPARIN SODIUM 40 MG/0.4ML IJ SOSY
40.0000 mg | PREFILLED_SYRINGE | INTRAMUSCULAR | Status: DC
  Administered 2021-06-01 – 2021-06-10 (×10): 40 mg via SUBCUTANEOUS
  Filled 2021-06-01 (×11): qty 0.4

## 2021-06-01 MED ORDER — GUAIFENESIN-DM 100-10 MG/5ML PO SYRP
10.0000 mL | ORAL_SOLUTION | ORAL | Status: DC | PRN
  Administered 2021-06-06: 10 mL via ORAL
  Filled 2021-06-01: qty 10

## 2021-06-01 MED ORDER — BRIMONIDINE TARTRATE 0.15 % OP SOLN
1.0000 [drp] | Freq: Two times a day (BID) | OPHTHALMIC | Status: DC
  Administered 2021-06-01 – 2021-06-11 (×20): 1 [drp] via OPHTHALMIC
  Filled 2021-06-01: qty 5

## 2021-06-01 MED ORDER — HYDROCODONE-ACETAMINOPHEN 5-325 MG PO TABS: 1.0000 | ORAL_TABLET | ORAL | Status: DC | PRN

## 2021-06-01 MED ORDER — TIMOLOL MALEATE 0.25 % OP SOLN
1.0000 [drp] | Freq: Two times a day (BID) | OPHTHALMIC | Status: DC
  Administered 2021-06-01 – 2021-06-11 (×20): 1 [drp] via OPHTHALMIC
  Filled 2021-06-01: qty 5

## 2021-06-01 MED ORDER — ALBUTEROL SULFATE HFA 108 (90 BASE) MCG/ACT IN AERS
2.0000 | INHALATION_SPRAY | Freq: Four times a day (QID) | RESPIRATORY_TRACT | Status: DC
  Administered 2021-06-01: 2 via RESPIRATORY_TRACT
  Filled 2021-06-01: qty 6.7

## 2021-06-01 MED ORDER — LACTATED RINGERS IV BOLUS
1000.0000 mL | Freq: Once | INTRAVENOUS | Status: AC
  Administered 2021-06-01: 1000 mL via INTRAVENOUS

## 2021-06-01 MED ORDER — ONDANSETRON HCL 4 MG/2ML IJ SOLN
4.0000 mg | Freq: Four times a day (QID) | INTRAMUSCULAR | Status: DC | PRN
  Administered 2021-06-02 – 2021-06-10 (×6): 4 mg via INTRAVENOUS
  Filled 2021-06-01 (×7): qty 2

## 2021-06-01 MED ORDER — CEFTRIAXONE SODIUM 1 G IJ SOLR
1.0000 g | Freq: Once | INTRAMUSCULAR | Status: AC
  Administered 2021-06-01: 1 g via INTRAVENOUS
  Filled 2021-06-01: qty 10

## 2021-06-01 MED ORDER — NIRMATRELVIR/RITONAVIR (PAXLOVID) TABLET (RENAL DOSING)
2.0000 | ORAL_TABLET | Freq: Two times a day (BID) | ORAL | Status: AC
  Administered 2021-06-01 – 2021-06-06 (×10): 2 via ORAL
  Filled 2021-06-01: qty 20

## 2021-06-01 MED ORDER — SODIUM CHLORIDE 0.9 % IV SOLN
500.0000 mg | INTRAVENOUS | Status: DC
  Administered 2021-06-01: 500 mg via INTRAVENOUS
  Filled 2021-06-01: qty 5

## 2021-06-01 MED ORDER — ONDANSETRON HCL 4 MG PO TABS
4.0000 mg | ORAL_TABLET | Freq: Four times a day (QID) | ORAL | Status: DC | PRN
  Administered 2021-06-05 – 2021-06-06 (×2): 4 mg via ORAL
  Filled 2021-06-01 (×2): qty 1

## 2021-06-01 MED ORDER — METOPROLOL SUCCINATE 12.5 MG HALF TABLET
12.5000 mg | ORAL_TABLET | Freq: Every day | ORAL | Status: DC | PRN
  Filled 2021-06-01: qty 1

## 2021-06-01 NOTE — Assessment & Plan Note (Addendum)
Unclear if true infiltrate or repeat chest x-ray check procalcitonin.  For tonight, antibiotics check Legionella and strep antigen ?If no evidence of typical pneumonia on chest x-ray/CT and normal procalcitonin consider discontinuing azithromycin ?  ?

## 2021-06-01 NOTE — Assessment & Plan Note (Signed)
Chronic, family states needs help at home  ?Will have PT OT eval for needs may need placement ? ?

## 2021-06-01 NOTE — Assessment & Plan Note (Signed)
COVID infection -  ?So far mild URI symptoms no evidence of hypoxia ? CXR with ? Infiltrate could be also typical pneumonia. ?Given recent exposure and only beginning of symptoms presume active infection ?Testing done at drop bridge cycling threshold not readily available ?A drawbridge already started on paxlovid will continue ? ?Hold off on steroids  ?No indication for immunomodulators ?Obtain inflammatory markers ?Pronate as needed ?  ?Supportive measures ?Avoid over aggressive fluid resuscitation ?Airborne precautions ? ?

## 2021-06-01 NOTE — Assessment & Plan Note (Signed)
-   currently appears to be slightly on the dry side, hold home diuretics for tonight and restart when appears euvolemic, carefuly follow fluid status and Cr  

## 2021-06-01 NOTE — Assessment & Plan Note (Signed)
Rehydrate, hold lasix ?

## 2021-06-01 NOTE — Subjective & Objective (Signed)
Presented to ER for cough lives with her daughter who tested positive for COVID today.  Patient at baseline wheelchair-bound status post AKA ?Patient was brought into transient and she noted to have a syncopal episode while sitting in the chair had weak carotid pulse and agonal respirations.  Patient was moved to resuscitation room and rapidly improved without any intervention. ?Patient states that she have not had much to eat today she has had some episodes of syncope while in assisted living.  She has been feeling fairly weak and congested.  Soft stool but no diarrhea.  No falls.  No chest pain or shortness of breath ?

## 2021-06-01 NOTE — Assessment & Plan Note (Signed)
-   order urine electrolytes, ?  Check TSH ?rehydrate ?  ?

## 2021-06-01 NOTE — ED Provider Notes (Signed)
?[facility] EMERGENCY DEPT ?Provider Note ? ? ?CSN: [id number] ?Arrival date & time: 06/01/21  1144 ? ?  ? ?History ? ?Chief Complaint  ?Patient presents with  ? Covid Exposure  ? Cough  ? ? ?Stephanie Donovan is a 88 y.o. female. ? ?HPI ? ?  ? ?88 year old female with a history of CHF, prior atrial fibrillation, hypertension, hyperlipidemia, TIA, hypothyroidism, right AKA in September, prior admission due to syncope secondary to hypotension at her facility in setting of UTI 07/2020, who presents with concern for cough, congestion, generalized weakness with known COVID exposure, and developed an episode of unresponsiveness on arrival to the emergency department. ? ?On my initial evaluation, history is very limited as patient was initially unresponsive, and then was altered. ? ?Per nursing, she came in via EMS with concern for COVID, generalized weakness and cough, but while she was in the triage room suddenly slumped over, had agonal respirations and a weak carotid pulse.  She was moved to the resuscitation room, and after transfer to stretcher she had palpable and strong radial pulses, her breathing improved, was able to answer questions.  No seizure-like activity was was witnessed. ? ?The patient reports cough, congestion, some generalized weakness today and decreased appetite.  Reports yesterday she was feeling better.  Denies any chest pain or shortness of breath. ? ?Hx from daughter; ? ?Coughing, congestion beginning yesterday ?Ate breakfast ?BP was ok when EMS checked it ? ?Sometimes she will have weakness, syncope, occurred in assisted living, was because she didn't have much to eat  ?A few episodes in assisted living had syncope ? ?NOt eating as much. Yesterday had scrambled eggs, toast ?Today was weak, drainage, congestion ? ?Having a lot of soft stool  ? ?Family sick with COVID ? ?No falls, no medication changes ? ?Does have a history of disorientation  ? ? ?Past Medical History:  ?Diagnosis  Date  ? Anemia   ? Blood transfusion without reported diagnosis   ? CHF (congestive heart failure) ([place])   ? Chronic back pain   ? Chronic constipation   ? Cognitive communication deficit   ? Dysrhythmia 2015  ? Afib with RVR - 2015, Oaroxyomal Afib, no current meds due do GI Bleed  ? GI bleed   ? Glaucoma   ? HLD (hyperlipidemia)   ? Hypertension   ? Hypothyroidism   ? Stroke [facility]) 08/2014  ? mini stroke  ? Syncope 2015  ? Thyroid disease   ? TIA (transient ischemic attack) 2015  ?  ? ? ?Home Medications ?Prior to Admission medications   ?Medication Sig Start Date End Date Taking? Authorizing Provider  ?brimonidine (ALPHAGAN P) 0.1 % SOLN Place 1 drop into the left eye in the morning and at bedtime.   Yes [provider]  ?ferrous sulfate 325 (65 FE) MG EC tablet Take 325 mg by mouth daily.   Yes [provider]  ?furosemide (LASIX) 20 MG tablet Take 20 mg by mouth daily.   Yes [provider]  ?levothyroxine (SYNTHROID) 50 MCG tablet Take 50 mcg by mouth daily before breakfast.   Yes [provider]  ?metoprolol succinate (TOPROL-XL) 25 MG 24 hr tablet Take 25 mg by mouth daily as needed (Give 12.5 mg if HR > 99 bpm).   Yes [provider]  ?Multiple Vitamin-Folic Acid TABS Take 1 tablet by mouth daily. 0.4 mg   Yes [provider]  ?timolol (TIMOPTIC) 0.25 % ophthalmic solution Place 1 drop into both eyes  2 (two) times daily. 12/22/19  Yes [provider]  ?cephALEXin (KEFLEX) 500 MG capsule Take 1 capsule (500 mg total) by mouth 4 (four) times daily. ?Patient not taking: Reported on 06/01/2021 03/24/21   Fredia Sorrow, MD  ?magnesium oxide (MAG-OX) 400 (241.3 Mg) MG tablet Take 1 tablet (400 mg total) by mouth 2 (two) times daily. ?Patient taking differently: Take 400 mg by mouth daily. 11/30/19   Regalado, Belkys A, MD  ?metoprolol succinate (TOPROL-XL) 25 MG 24 hr tablet Take 1 tablet (25 mg total) by mouth daily. ?Patient not taking: Reported on  12/10/2020 08/03/20   Charlynne Cousins, MD  ?oxyCODONE (OXY IR/ROXICODONE) 5 MG immediate release tablet Take 1 tablet (5 mg total) by mouth every 6 (six) hours as needed for severe pain. ?Patient not taking: Reported on 12/10/2020 10/10/20   Persons, Bevely Palmer, [place]  ?zinc sulfate 220 (50 Zn) MG capsule Take 1 capsule (220 mg total) by mouth daily. ?Patient not taking: Reported on 12/10/2020 10/10/20   Persons, Bevely Palmer, [place]  ?   ? ?Allergies    ?Aspirin, Penicillins, and Codeine   ? ?Review of Systems   ?Review of Systems ? ?Physical Exam ?Updated Vital Signs ?BP 108/86 (BP Location: Right Arm)   Pulse 100   Temp 98.4 ?F (36.9 ?C) (Oral)   Resp 18   Ht 5' (1.524 m)   Wt 63.5 kg   SpO2 95%   BMI 27.34 kg/m?  ?Physical Exam ?Vitals and nursing note reviewed.  ?Constitutional:   ?   General: She is not in acute distress. ?   Appearance: She is well-developed. She is not diaphoretic.  ?HENT:  ?   Head: Normocephalic and atraumatic.  ?Eyes:  ?   Conjunctiva/sclera: Conjunctivae normal.  ?Cardiovascular:  ?   Rate and Rhythm: Normal rate and regular rhythm.  ?   Heart sounds: Normal heart sounds. No murmur heard. ?  No friction rub. No gallop.  ?Pulmonary:  ?   Effort: Pulmonary effort is normal. No respiratory distress.  ?   Breath sounds: Normal breath sounds. No wheezing or rales.  ?Abdominal:  ?   General: There is no distension.  ?   Palpations: Abdomen is soft.  ?   Tenderness: There is no abdominal tenderness. There is no guarding.  ?Musculoskeletal:     ?   General: No tenderness.  ?   Cervical back: Normal range of motion.  ?Skin: ?   General: Skin is warm and dry.  ?   Findings: No erythema or rash.  ?Neurological:  ?   Mental Status: She is alert.  ?   Comments: Oriented to self, type of building (not location or date)   ? ? ?ED Results / Procedures / Treatments   ?Labs ?(all labs ordered are listed, but only abnormal results are displayed) ?Labs Reviewed  ?RESP PANEL BY RT-PCR (FLU A&B, COVID) ARPGX2 -  Abnormal; Notable for the following components:  ?    Result Value  ? SARS Coronavirus 2 by RT PCR POSITIVE (*)   ? All other components within normal limits  ?COMPREHENSIVE METABOLIC PANEL - Abnormal; Notable for the following components:  ? Sodium 132 (*)   ? Chloride 97 (*)   ? Glucose, Bld 126 (*)   ? All other components within normal limits  ?LACTIC ACID, PLASMA - Abnormal; Notable for the following components:  ? Lactic Acid, Venous 2.0 (*)   ? All other components within normal limits  ?URINALYSIS, ROUTINE  W REFLEX MICROSCOPIC - Abnormal; Notable for the following components:  ? Hgb urine dipstick SMALL (*)   ? Nitrite POSITIVE (*)   ? Leukocytes,Ua SMALL (*)   ? Bacteria, UA MANY (*)   ? All other components within normal limits  ?BASIC METABOLIC PANEL - Abnormal; Notable for the following components:  ? Sodium 134 (*)   ? Glucose, Bld 105 (*)   ? Creatinine, Ser 0.41 (*)   ? Calcium 8.5 (*)   ? All other components within normal limits  ?CK - Abnormal; Notable for the following components:  ? Total CK 17 (*)   ? All other components within normal limits  ?D-DIMER, QUANTITATIVE - Abnormal; Notable for the following components:  ? D-Dimer, Quant 2.03 (*)   ? All other components within normal limits  ?CBG MONITORING, ED - Abnormal; Notable for the following components:  ? Glucose-Capillary 125 (*)   ? All other components within normal limits  ?CULTURE, BLOOD (ROUTINE X 2)  ?CULTURE, BLOOD (ROUTINE X 2)  ?URINE CULTURE  ?EXPECTORATED SPUTUM ASSESSMENT W GRAM STAIN, RFLX TO RESP C  ?CBC WITH DIFFERENTIAL/PLATELET  ?LACTIC ACID, PLASMA  ?MAGNESIUM  ?MAGNESIUM  ?PHOSPHORUS  ?TSH  ?FERRITIN  ?LACTATE DEHYDROGENASE  ?PROCALCITONIN  ?D-DIMER, QUANTITATIVE (NOT AT [facility])  ?CREATININE, URINE, RANDOM  ?OSMOLALITY  ?OSMOLALITY, URINE  ?PREALBUMIN  ?SODIUM, URINE, RANDOM  ?C-REACTIVE PROTEIN  ?D-DIMER, QUANTITATIVE  ?C-REACTIVE PROTEIN  ?COMPREHENSIVE METABOLIC PANEL  ?CBC WITH DIFFERENTIAL/PLATELET  ?MAGNESIUM   ?PHOSPHORUS  ?CBC  ?LEGIONELLA PNEUMOPHILA SEROGP 1 UR AG  ?STREP PNEUMONIAE URINARY ANTIGEN  ?TROPONIN I (HIGH SENSITIVITY)  ?TROPONIN I (HIGH SENSITIVITY)  ? ? ?EKG ?EKG Interpretation ? ?Date/Time:  Thursday May

## 2021-06-01 NOTE — ED Triage Notes (Signed)
Patient reports to the ER for cough. Patient has a mild cough. Patient lives with her daughter that has tested positive for COVID. Patient's daughter ordered for EMS to bring her for evaluation. Patient is wheelchair bound at baseline due to an AKA.  ?

## 2021-06-01 NOTE — Plan of Care (Signed)
  Problem: Activity: Goal: Ability to tolerate increased activity will improve Outcome: Progressing   Problem: Clinical Measurements: Goal: Ability to maintain a body temperature in the normal range will improve Outcome: Progressing   Problem: Respiratory: Goal: Ability to maintain adequate ventilation will improve Outcome: Progressing Goal: Ability to maintain a clear airway will improve Outcome: Progressing   

## 2021-06-01 NOTE — Assessment & Plan Note (Addendum)
Now in sinus rhythm not on anticoagulation due to risk of falls ?Cont BB ?

## 2021-06-01 NOTE — Assessment & Plan Note (Signed)
-   treat with Rocephin         await results of urine culture and adjust antibiotic coverage as needed  

## 2021-06-01 NOTE — Assessment & Plan Note (Signed)
-   Check TSH continue home medications at current dose ° °

## 2021-06-01 NOTE — Assessment & Plan Note (Signed)
Pt will need t be referred to Dermatology as an outpt ?

## 2021-06-01 NOTE — Assessment & Plan Note (Addendum)
Troponin negative, ?Echo in AM ?Monitor on tele ?Rehydrate and hold lasix ?Given elevated d.dimer and covid diagnosis ?Will obtain CTA ?

## 2021-06-01 NOTE — Assessment & Plan Note (Signed)
Aspirin listed as allergy although family states they give it to her most of the days ? ?Restart if tolerates ? ?

## 2021-06-01 NOTE — Assessment & Plan Note (Signed)
stable continue home medications  ? ?

## 2021-06-01 NOTE — ED Notes (Signed)
Pt was in wheelchair in triage room and staff noted pt to suddenly slump over in chair. Pt was noted to have agonal respirations and weak carotid pulse. Pt moved to resuscitation room and EDP to bedside. Pt noted to increase resp and regained strong +2 radial pulses. Pt became more alert. Pt placed on cardiac monitoring and noted NSR.  ?

## 2021-06-01 NOTE — H&P (Signed)
? ? ?Stephanie Donovan CBJ:[id number] DOB: 1927-05-03 DOA: 06/01/2021 ? ? ?  ?PCP: Clovia Cuff, MD   ?Outpatient Specialists:  ?  ?Orthopedics Dr. Sharol Given ?Patient arrived to ER on 06/01/21 at 1144 ?Referred by Attending Toy Baker, MD ? ? ?Patient coming from:   ? home Lives  With family ?  ? ?Chief Complaint:   ?Chief Complaint  ?Patient presents with  ? Covid Exposure  ? Cough  ? ? ?HPI: ?Stephanie Donovan is a 88 y.o. 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  ? ?Presented with   generalized fatigue weakness and syncopal event ?Presented to ER for cough lives with her daughter who tested positive for COVID today.  Patient at baseline wheelchair-bound status post AKA ?Patient was brought into transient and she noted to have a syncopal episode while sitting in the chair had weak carotid pulse and agonal respirations.  Patient was moved to resuscitation room and rapidly improved without any intervention. ?Patient states that she have not had much to eat today she has had some episodes of syncope while in assisted living.  She has been feeling fairly weak and congested.  Soft stool but no diarrhea.  No falls.  No chest pain or shortness of breath ? She is incontinent of urine and stool at baseline ?Dementia she gets worse at night ?She have had a few syncopal episodes over the past few years when her BP would go down  ?Her Covid symptoms only started yesterday ?She never had a fever ?She is fully vaccinated for COVID ?Family has been trying to get her into assisted living ? ? Initial COVID TEST  ?  POSITIVE  ?Lab Results  ?Component Value Date  ? SARSCOV2NAA POSITIVE (A) 06/01/2021  ? [place] NEGATIVE 10/10/2020  ? [place] NEGATIVE 10/07/2020  ? [place]  NEGATIVE 08/02/2020  ? ?  ?Regarding pertinent Chronic problems:   ? Hyperlipidemia -not on on statins ?Lipid Panel  ?   ?Component Value Date/Time  ? CHOL 170 08/26/2014 0453  ? TRIG 71 08/26/2014 0453  ? HDL 57 08/26/2014 0453  ? CHOLHDL 3.0 08/26/2014 0453  ? VLDL 14 08/26/2014 0453  ? [place] 99 08/26/2014 0453  ? ? ? HTN on Toprol ? ? chronic CHF diastolic - last echo July 1607 grade 1 diastolic dysfunction EF 37-10% ? On lasix ?  ? Hypothyroidism:  ?Lab Results  ?Component Value Date  ? TSH 1.512 07/22/2020  ? on synthroid ?   ?  Hx of TIA -  with/out residual deficits on Aspirin 81 mg,   ? ? A. Fib -  - CHA2DS2 vas score  7  Not on anticoagulation secondary to Risk of Falls  ?       -  Rate control:  Currently controlled with  Toprolol,    ? ?Dementia - not on any meds ?  ? ?While in ER: ?  ? ?Had a witnessed syncopal event while in triage in the emergency department ?Work-up was significant for patient being COVID-positive ?And possibly has recurrent UTI she was given a dose of Rocephin and Paxlovid ?Ordered ? ?CT HEAD   NON acute ? ?CXR - Cardiomegaly without congestive failure. ?   ?CTA chest -  Ordered ? ?Following Medications were ordered in ER: ?Medications  ?nirmatrelvir/ritonavir  EUA (renal dosing) (PAXLOVID) 2 tablet (has no administration in time range)  ?ondansetron (ZOFRAN) injection 4 mg (4 mg Intravenous Given 06/01/21 1306)  ?lactated ringers bolus 1,000 mL (1,000 mLs Intravenous New Bag/Given 06/01/21 1556)  ?cefTRIAXone (ROCEPHIN) 1 g in sodium chloride 0.9 % 100 mL IVPB (1 g Intravenous New Bag/Given 06/01/21 1706)  ?  ?____  ?  ?ED Triage Vitals  ?Enc Vitals Group  ?   BP 06/01/21 1220 125/85  ?   Pulse Rate 06/01/21 1220 92  ?   Resp 06/01/21 1220 (!) 22  ?   Temp 06/01/21 1228 99.7 ?F (37.6 ?C)  ?   Temp Source 06/01/21 1228 Rectal  ?   SpO2 06/01/21 1152 97 %  ?   Weight 06/01/21 1228 140 lb (63.5 kg)  ?   Height 06/01/21 1228 5' (1.524 m)  ?   Head Circumference --   ?   Peak Flow --   ?    Pain Score 06/01/21 1808 0  ?   Pain Loc --   ?   Pain Edu? --   ?   Excl. in [place]? --   ?RWER(15)@    ? _________________________________________ ?Significant initial  Findings: ?Abnormal Labs Reviewed  ?RESP PANEL BY RT-PCR (FLU A&B, COVID) ARPGX2 - Abnormal; Notable for the following components:  ?    Result Value  ? SARS Coronavirus 2 by RT PCR POSITIVE (*)   ? All other components within normal limits  ?COMPREHENSIVE METABOLIC PANEL - Abnormal; Notable for the following components:  ? Sodium 132 (*)   ? Chloride 97 (*)   ? Glucose, Bld 126 (*)   ? All other components within normal limits  ?LACTIC ACID, PLASMA - Abnormal; Notable for the following components:  ? Lactic Acid, Venous 2.0 (*)   ? All other components within normal limits  ?URINALYSIS, ROUTINE W REFLEX MICROSCOPIC - Abnormal; Notable for the following components:  ? Hgb urine dipstick SMALL (*)   ? Nitrite POSITIVE (*)   ? Leukocytes,Ua SMALL (*)   ? Bacteria, UA MANY (*)   ? All other components within normal limits  ?CBG MONITORING, ED - Abnormal; Notable for the following components:  ? Glucose-Capillary 125 (*)   ? All other components within normal limits  ? ?  ?_________________________ ?Troponin 4 - 3 ?ECG: Ordered ?Personally reviewed by me showing: ?HR : 91 ?Rhythm: Abnormal R-wave progression, early transition ?No significant change since last tracing ?QTC 463 ?  ? ?The recent clinical data is shown below. ?Vitals:  ? 06/01/21 1630 06/01/21 1700 06/01/21 1710 06/01/21 1825  ?BP: 132/62 136/77  130/78  ?Pulse: 89 92  95  ?Resp: '15 18  18  '$ ?Temp:   98.3 ?F (36.8 ?C) 98.1 ?F (36.7 ?C)  ?TempSrc:   Oral Oral  ?SpO2: 98% 94%  96%  ?Weight:      ?Height:      ?  ?WBC ? ?   ?Component Value Date/Time  ? WBC 8.4 06/01/2021 1226  ? LYMPHSABS 1.6 06/01/2021 1226  ? MONOABS 0.9 06/01/2021 1226  ? EOSABS 0.1 06/01/2021 1226  ? BASOSABS 0.1 06/01/2021 1226  ? ?  ? ?Lactic Acid, Venous ?   ?Component Value Date/Time  ? LATICACIDVEN 1.1 06/01/2021 1500  ?    ?Procalcitonin   Ordered ?   ? UA  evidence of UTI    ?  ?Urine analysis: ?   ?Component Value Date/Time  ? COLORURINE YELLOW 06/01/2021 1500  ?  APPEARANCEUR CLEAR 06/01/2021 1500  ? LABSPEC 1.015 06/01/2021 1500  ? PHURINE 5.0 06/01/2021 1500  ? GLUCOSEU NEGATIVE 06/01/2021 1500  ? HGBUR SMALL (A) 06/01/2021 1500  ? BILIRUBINUR NEGATIVE 06/01/2021 1500  ? KETONESUR NEGATIVE 06/01/2021 1500  ? PROTEINUR NEGATIVE 06/01/2021 1500  ? UROBILINOGEN 0.2 08/25/2014 0454  ? NITRITE POSITIVE (A) 06/01/2021 1500  ? LEUKOCYTESUR SMALL (A) 06/01/2021 1500  ? ? ?Results for orders placed or performed during the hospital encounter of 06/01/21  ?Resp Panel by RT-PCR (Flu A&B, Covid) Nasopharyngeal Swab     Status: Abnormal  ? Collection Time: 06/01/21 12:27 PM  ? Specimen: Nasopharyngeal Swab; Nasopharyngeal(NP) swabs in vial transport medium  ?Result Value Ref Range Status  ? SARS Coronavirus 2 by RT PCR POSITIVE (A) NEGATIVE Final  ?      ? Influenza A by PCR NEGATIVE NEGATIVE Final  ? Influenza B by PCR NEGATIVE NEGATIVE Final  ?      ? ?  ?_______________________________________________ ?Hospitalist was called for admission for syncope and COVID infection ?  ?The following Work up has been ordered so far: ? ?Orders Placed This Encounter  ?Procedures  ? Resp Panel by RT-PCR (Flu A&B, Covid) Nasopharyngeal Swab  ? Blood culture (routine x 2)  ? Urine Culture  ? CT Head Wo Contrast  ? DG Chest Portable 1 View  ? CBC with Differential  ? Comprehensive metabolic panel  ? Lactic acid, plasma  ? Urinalysis, Routine w reflex microscopic  ? Magnesium  ? D-dimer, quantitative  ? Care order/instruction: Please [facility] hospitalist when patient comes to the hospital for further orders  ? Cardiac Monitoring  ? Consult to hospitalist  ? Airborne and Contact precautions  ? CBG monitoring, ED  ? EKG 12-Lead  ? Place in observation (patient's expected length of stay will be less than 2 midnights)  ?  ? ?OTHER Significant initial   Findings: ? ?labs showing: ? ?  ?Recent Labs  ?Lab 06/01/21 ?1226  ?NA 132*  ?K 3.9  ?CO2 25  ?GLUCOSE 126*  ?BUN 15  ?CREATININE 0.49  ?CALCIUM 9.2  ?MG 1.9  ? ? ?Cr   stable,    ?Lab Results  ?Component Value

## 2021-06-02 ENCOUNTER — Observation Stay (HOSPITAL_BASED_OUTPATIENT_CLINIC_OR_DEPARTMENT_OTHER): Payer: Medicare Other

## 2021-06-02 ENCOUNTER — Observation Stay (HOSPITAL_COMMUNITY): Payer: Medicare Other

## 2021-06-02 DIAGNOSIS — R32 Unspecified urinary incontinence: Secondary | ICD-10-CM | POA: Diagnosis present

## 2021-06-02 DIAGNOSIS — I480 Paroxysmal atrial fibrillation: Secondary | ICD-10-CM | POA: Diagnosis present

## 2021-06-02 DIAGNOSIS — N390 Urinary tract infection, site not specified: Secondary | ICD-10-CM | POA: Diagnosis present

## 2021-06-02 DIAGNOSIS — Z79890 Hormone replacement therapy (postmenopausal): Secondary | ICD-10-CM | POA: Diagnosis not present

## 2021-06-02 DIAGNOSIS — E039 Hypothyroidism, unspecified: Secondary | ICD-10-CM | POA: Diagnosis present

## 2021-06-02 DIAGNOSIS — E041 Nontoxic single thyroid nodule: Secondary | ICD-10-CM | POA: Diagnosis present

## 2021-06-02 DIAGNOSIS — R55 Syncope and collapse: Secondary | ICD-10-CM | POA: Diagnosis present

## 2021-06-02 DIAGNOSIS — E785 Hyperlipidemia, unspecified: Secondary | ICD-10-CM | POA: Diagnosis present

## 2021-06-02 DIAGNOSIS — Z7189 Other specified counseling: Secondary | ICD-10-CM | POA: Diagnosis not present

## 2021-06-02 DIAGNOSIS — E860 Dehydration: Secondary | ICD-10-CM | POA: Diagnosis present

## 2021-06-02 DIAGNOSIS — Z9181 History of falling: Secondary | ICD-10-CM | POA: Diagnosis not present

## 2021-06-02 DIAGNOSIS — K5909 Other constipation: Secondary | ICD-10-CM | POA: Diagnosis present

## 2021-06-02 DIAGNOSIS — E871 Hypo-osmolality and hyponatremia: Secondary | ICD-10-CM | POA: Diagnosis present

## 2021-06-02 DIAGNOSIS — Z66 Do not resuscitate: Secondary | ICD-10-CM | POA: Diagnosis present

## 2021-06-02 DIAGNOSIS — R531 Weakness: Secondary | ICD-10-CM | POA: Diagnosis not present

## 2021-06-02 DIAGNOSIS — Z993 Dependence on wheelchair: Secondary | ICD-10-CM | POA: Diagnosis not present

## 2021-06-02 DIAGNOSIS — Z515 Encounter for palliative care: Secondary | ICD-10-CM | POA: Diagnosis not present

## 2021-06-02 DIAGNOSIS — I5032 Chronic diastolic (congestive) heart failure: Secondary | ICD-10-CM | POA: Diagnosis present

## 2021-06-02 DIAGNOSIS — B9620 Unspecified Escherichia coli [E. coli] as the cause of diseases classified elsewhere: Secondary | ICD-10-CM | POA: Diagnosis present

## 2021-06-02 DIAGNOSIS — E872 Acidosis, unspecified: Secondary | ICD-10-CM | POA: Diagnosis present

## 2021-06-02 DIAGNOSIS — R41841 Cognitive communication deficit: Secondary | ICD-10-CM | POA: Diagnosis present

## 2021-06-02 DIAGNOSIS — U071 COVID-19: Secondary | ICD-10-CM | POA: Diagnosis present

## 2021-06-02 DIAGNOSIS — D2272 Melanocytic nevi of left lower limb, including hip: Secondary | ICD-10-CM | POA: Diagnosis present

## 2021-06-02 DIAGNOSIS — F0390 Unspecified dementia without behavioral disturbance: Secondary | ICD-10-CM | POA: Diagnosis present

## 2021-06-02 DIAGNOSIS — E038 Other specified hypothyroidism: Secondary | ICD-10-CM

## 2021-06-02 DIAGNOSIS — S78111A Complete traumatic amputation at level between right hip and knee, initial encounter: Secondary | ICD-10-CM | POA: Diagnosis not present

## 2021-06-02 DIAGNOSIS — I110 Hypertensive heart disease with heart failure: Secondary | ICD-10-CM | POA: Diagnosis present

## 2021-06-02 DIAGNOSIS — H409 Unspecified glaucoma: Secondary | ICD-10-CM | POA: Diagnosis present

## 2021-06-02 DIAGNOSIS — D649 Anemia, unspecified: Secondary | ICD-10-CM | POA: Diagnosis present

## 2021-06-02 LAB — COMPREHENSIVE METABOLIC PANEL
ALT: 15 U/L (ref 0–44)
AST: 17 U/L (ref 15–41)
Albumin: 3 g/dL — ABNORMAL LOW (ref 3.5–5.0)
Alkaline Phosphatase: 89 U/L (ref 38–126)
Anion gap: 8 (ref 5–15)
BUN: 12 mg/dL (ref 8–23)
CO2: 27 mmol/L (ref 22–32)
Calcium: 8.2 mg/dL — ABNORMAL LOW (ref 8.9–10.3)
Chloride: 98 mmol/L (ref 98–111)
Creatinine, Ser: 0.34 mg/dL — ABNORMAL LOW (ref 0.44–1.00)
GFR, Estimated: >60 mL/min (ref >60)
Glucose, Bld: 87 mg/dL (ref 70–99)
Potassium: 3.7 mmol/L (ref 3.5–5.1)
Sodium: 133 mmol/L — ABNORMAL LOW (ref 135–145)
Total Bilirubin: 0.6 mg/dL (ref 0.3–1.2)
Total Protein: 5.9 g/dL — ABNORMAL LOW (ref 6.5–8.1)

## 2021-06-02 LAB — C-REACTIVE PROTEIN: CRP: 1.5 mg/dL — ABNORMAL HIGH (ref <1.0)

## 2021-06-02 LAB — CBC WITH DIFFERENTIAL/PLATELET
Abs Immature Granulocytes: 0.03 10*3/uL (ref 0.00–0.07)
Basophils Absolute: 0.1 10*3/uL (ref 0.0–0.1)
Basophils Relative: 1 %
Eosinophils Absolute: 0 10*3/uL (ref 0.0–0.5)
Eosinophils Relative: 0 %
HCT: 36.4 % (ref 36.0–46.0)
Hemoglobin: 11.7 g/dL — ABNORMAL LOW (ref 12.0–15.0)
Immature Granulocytes: 1 %
Lymphocytes Relative: 10 %
Lymphs Abs: 0.6 10*3/uL — ABNORMAL LOW (ref 0.7–4.0)
MCH: 28.9 pg (ref 26.0–34.0)
MCHC: 32.1 g/dL (ref 30.0–36.0)
MCV: 89.9 fL (ref 80.0–100.0)
Monocytes Absolute: 0.7 10*3/uL (ref 0.1–1.0)
Monocytes Relative: 14 %
Neutro Abs: 4 10*3/uL (ref 1.7–7.7)
Neutrophils Relative %: 74 %
Platelets: 178 10*3/uL (ref 150–400)
RBC: 4.05 MIL/uL (ref 3.87–5.11)
RDW: 13.9 % (ref 11.5–15.5)
WBC: 5.4 10*3/uL (ref 4.0–10.5)
nRBC: 0 % (ref 0.0–0.2)

## 2021-06-02 LAB — ECHOCARDIOGRAM COMPLETE
AR max vel: 2.08 cm2
AV Peak grad: 8.2 mmHg
Ao pk vel: 1.43 m/s
Area-P 1/2: 2.62 cm2
Height: 60 in
S' Lateral: 2.4 cm
Weight: 2240 oz

## 2021-06-02 LAB — D-DIMER, QUANTITATIVE: D-Dimer, Quant: 2.74 ug/mL-FEU — ABNORMAL HIGH (ref 0.00–0.50)

## 2021-06-02 LAB — PHOSPHORUS: Phosphorus: 3.6 mg/dL (ref 2.5–4.6)

## 2021-06-02 LAB — MAGNESIUM: Magnesium: 1.9 mg/dL (ref 1.7–2.4)

## 2021-06-02 LAB — PREALBUMIN: Prealbumin: 11.3 mg/dL — ABNORMAL LOW (ref 18–38)

## 2021-06-02 LAB — STREP PNEUMONIAE URINARY ANTIGEN: Strep Pneumo Urinary Antigen: NEGATIVE

## 2021-06-02 MED ORDER — SODIUM CHLORIDE 0.9 % IV SOLN
12.5000 mg | Freq: Two times a day (BID) | INTRAVENOUS | Status: DC | PRN
  Administered 2021-06-04 – 2021-06-08 (×3): 12.5 mg via INTRAVENOUS
  Filled 2021-06-02 (×8): qty 0.5

## 2021-06-02 MED ORDER — SODIUM CHLORIDE (PF) 0.9 % IJ SOLN
INTRAMUSCULAR | Status: AC
Start: 2021-06-02 — End: 2021-06-03
  Filled 2021-06-02: qty 50

## 2021-06-02 MED ORDER — SODIUM CHLORIDE 0.9 % IV SOLN
12.5000 mg | Freq: Four times a day (QID) | INTRAVENOUS | Status: DC | PRN
  Filled 2021-06-02: qty 0.5

## 2021-06-02 MED ORDER — ENSURE ENLIVE PO LIQD
237.0000 mL | Freq: Two times a day (BID) | ORAL | Status: DC
  Administered 2021-06-03 – 2021-06-08 (×6): 237 mL via ORAL

## 2021-06-02 MED ORDER — SODIUM CHLORIDE 0.9 % IV SOLN: INTRAVENOUS | Status: DC

## 2021-06-02 MED ORDER — ADULT MULTIVITAMIN W/MINERALS CH
1.0000 | ORAL_TABLET | Freq: Every day | ORAL | Status: DC
  Administered 2021-06-02 – 2021-06-11 (×9): 1 via ORAL
  Filled 2021-06-02 (×9): qty 1

## 2021-06-02 MED ORDER — ALBUTEROL SULFATE HFA 108 (90 BASE) MCG/ACT IN AERS: 2.0000 | INHALATION_SPRAY | Freq: Four times a day (QID) | RESPIRATORY_TRACT | Status: DC | PRN

## 2021-06-02 MED ORDER — IOHEXOL 350 MG/ML SOLN
80.0000 mL | Freq: Once | INTRAVENOUS | Status: AC | PRN
  Administered 2021-06-02: 80 mL via INTRAVENOUS

## 2021-06-02 NOTE — Evaluation (Signed)
Physical Therapy Evaluation ?Patient Details ?Name: Stephanie Donovan ?MRN: [id number] ?DOB: 07/11/1927 ?Today's Date: 06/02/2021 ? ?History of Present Illness ? Stephanie Donovan is a 88 y.o. 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 Presented with generalized fatigue weakness and syncopal event  Found to be COVID positive. ?  ?Clinical Impression ? Pt is a 88 y.o. woman with above HPI resulting in the deficits listed below (see PT Problem List). At baseline she lives with her daughter and is at wheelchair level. Patient is confused and not a reliable historian and no family in room. She reports she lives alone [place] She reports she does her ADLs and transfers but suspect she needs assistance. Unsure if she has additional assistance at home. She currently requires MOD A+2 for perform squat pivot transfer to chair. Pt will benefit from continued skilled PT during stay to maximize functional mobility and increase independence. Recommend short term rehab upon discharge to maximize functional mobility and decrease caregiver burden.    ? ?Recommendations for follow up therapy are one component of a multi-disciplinary discharge planning process, led by the attending physician.  Recommendations may be updated based on patient status, additional functional criteria and insurance authorization. ? ?Follow Up Recommendations Skilled nursing-short term rehab (<3 hours/day) ? ?  ?Assistance Recommended at Discharge Frequent or constant Supervision/Assistance  ?Patient can return home with the following ? Two people to help with walking and/or transfers;A lot of help with bathing/dressing/bathroom;Assistance with cooking/housework;Direct supervision/assist  for medications management;Direct supervision/assist for financial management;Assist for transportation;Help with stairs or ramp for entrance ? ?  ?Equipment Recommendations None recommended by PT  ?Recommendations for Other Services ?    ?  ?Functional Status Assessment Patient has had a recent decline in their functional status and demonstrates the ability to make significant improvements in function in a reasonable and predictable amount of time.  ? ?  ?Precautions / Restrictions Precautions ?Precautions: Fall ?Restrictions ?Weight Bearing Restrictions: No  ? ?  ? ?Mobility ? Bed Mobility ?Overal bed mobility: Needs Assistance ?Bed Mobility: Supine to Sit ?  ?  ?Supine to sit: Mod assist, HOB elevated, +2 for safety/equipment ?  ?  ?General bed mobility comments: use of bed rails and increased time ?  ? ?Transfers ?Overall transfer level: Needs assistance ?Equipment used: None ?Transfers: Bed to chair/wheelchair/BSC ?  ?  ?  ?Squat pivot transfers: Mod assist, +2 physical assistance ?  ?  ?General transfer comment: MOD A+2 to pivot to recliner chair. Attempts to simulate home environment set up. Pt unable to tell therapists how exactly she performs on her own at home. ?  ? ?Ambulation/Gait ?  ?  ?  ?  ?  ?  ?  ?  ? ?Stairs ?  ?  ?  ?  ?  ? ?Wheelchair Mobility ?  ? ?Modified Rankin (Stroke Patients Only) ?  ? ?  ? ?Balance Overall balance assessment: Needs assistance ?Sitting-balance support: No upper extremity supported, Feet supported ?Sitting balance-Leahy Scale: Fair ?  ?  ?  ?  ?  ?  ?  ?  ?  ?  ?  ?  ?  ?  ?  ?  ?   ? ? ? ?Pertinent Vitals/Pain Pain Assessment ?Pain Assessment: No/denies  pain  ? ? ?Home Living Family/patient expects to be discharged to:: Private residence ?Living Arrangements: Alone ?Available Help at Discharge: Family ?Type of Home: Apartment (1st floor) ?  ?  ?  ?  ?Home Layout: One level ?Home Equipment: Wheelchair - manual ?Additional Comments: reports she propels w/c on her own. says  "daughter works [place] the [place]" comes by to visit everyday but she is alone during the day.  ?  ?Prior Function Prior Level of Function : Patient poor historian/Family not available ?  ?  ?  ?  ?  ?  ?Mobility Comments: w/c bound. " i guess do" when asked if she gets to w/c on her own. ?ADLs Comments: wears briefs at baseline. states she wheels to bathroom to use toilet, no grab bars. Reports she does her own washing and getting dressed. ?  ? ? ?Hand Dominance  ?   ? ?  ?Extremity/Trunk Assessment  ? Upper Extremity Assessment ?Upper Extremity Assessment: RUE deficits/detail;LUE deficits/detail ?RUE Deficits / Details: WFL ROM, grossly 4-/5 strength throughout ?RUE Sensation: WNL ?RUE Coordination: WNL ?LUE Deficits / Details: WFL ROM, grossly 4-/5 strength throughout ?LUE Sensation: WNL ?LUE Coordination: WNL ?  ? ?Lower Extremity Assessment ?Lower Extremity Assessment: Defer to PT evaluation ?  ? ?Cervical / Trunk Assessment ?Cervical / Trunk Assessment: Normal  ?Communication  ? Communication: No difficulties  ?Cognition Arousal/Alertness: Awake/alert ?Behavior During Therapy: [facility] for tasks assessed/performed ?Overall Cognitive Status: History of cognitive impairments - at baseline ?  ?  ?  ?  ?  ?  ?  ?  ?  ?  ?  ?  ?  ?  ?  ?  ?General Comments: stating " I guess so" multiple times during session when asked about PLOF. able to state current president, she is from [place], states "because of virus" when asked why therapist has to wear mask, knows mother's day is this sunday but unable to recall current month. History of AKA - unable to recall when she had AKA procedure. ?  ?  ? ?  ?General Comments   ? ?  ?Exercises    ? ?Assessment/Plan  ?  ?PT Assessment Patient needs continued PT services  ?PT Problem List Decreased strength;Decreased activity tolerance;Decreased balance;Decreased mobility;Decreased cognition;Decreased safety awareness ? ?   ?  ?PT Treatment Interventions Functional mobility  training;Therapeutic activities;Therapeutic exercise;Balance training;Patient/family education;Wheelchair mobility training   ? ?PT Goals (Current goals can be found in the Care Plan section)  ?Acute Rehab PT Goals ?PT Goal Formulation: Patient unable to participate in goal setting ?Time For Goal Achievement: 06/16/21 ?Potential to Achieve Goals: Good ? ?  ?Frequency Min 2X/week ?  ? ? ?Co-evaluation   ?  ?  ?  ?  ? ? ?  ?AM-PAC PT "6 Clicks" Mobility  ?Outcome Measure Help needed turning from your back to your side while in a flat bed without using bedrails?: A Lot ?Help needed moving from lying on your back to sitting on the side of a flat bed without using bedrails?: Total ?Help needed moving to and from a bed to a chair (including a wheelchair)?: Total ?Help needed standing up from a chair using your arms (e.g., wheelchair or bedside chair)?: Total ?Help needed to walk in hospital room?: Total (w/c bound since AKA) ?Help needed climbing 3-5 steps with a railing? : Total ?6 Click Score: 7 ? ?  ?End of Session   ?Activity Tolerance: Patient tolerated treatment well ?Patient left: in chair;with call bell/phone within  reach;with chair alarm set ?Nurse Communication: Mobility status ?PT Visit Diagnosis: Unsteadiness on feet (R26.81);Other abnormalities of gait and mobility (R26.89);Muscle weakness (generalized) (M62.81) ?  ? ?Time: 4975-3005 ?PT Time Calculation (min) (ACUTE ONLY): 21 min ? ? ?Charges:   PT Evaluation ?$PT Eval Low Complexity: 1 Low ?  ?  ?   ? ? ?Festus Barren., PT, DPT  ?Acute Rehabilitation Services  ?Office [phone] ?06/02/2021, 12:01 PM ? ?

## 2021-06-02 NOTE — Evaluation (Signed)
Clinical/Bedside Swallow Evaluation ?Patient Details  ?Name: Stephanie Donovan ?MRN: [id number] ?Date of Birth: 1927-08-14 ? ?Today's Date: 06/02/2021 ?Time: SLP Start Time (ACUTE ONLY): 1020 SLP Stop Time (ACUTE ONLY): 9449 ?SLP Time Calculation (min) (ACUTE ONLY): 27 min ? ?Past Medical History:  ?Past Medical History:  ?Diagnosis Date  ? Anemia   ? Blood transfusion without reported diagnosis   ? CHF (congestive heart failure) ([place])   ? Chronic back pain   ? Chronic constipation   ? Cognitive communication deficit   ? Dysrhythmia 2015  ? Afib with RVR - 2015, Oaroxyomal Afib, no current meds due do GI Bleed  ? GI bleed   ? Glaucoma   ? HLD (hyperlipidemia)   ? Hypertension   ? Hypothyroidism   ? Stroke [facility]) 08/2014  ? mini stroke  ? Syncope 2015  ? Thyroid disease   ? TIA (transient ischemic attack) 2015  ? ?Past Surgical History:  ?Past Surgical History:  ?Procedure Laterality Date  ? AMPUTATION Right 10/07/2020  ? Procedure: RIGHT ABOVE KNEE AMPUTATION;  Surgeon: Newt Minion, MD;  Location: [place];  Service: Orthopedics;  Laterality: Right;  ? CATARACT EXTRACTION, BILATERAL    ? COLONOSCOPY    ? HIP ARTHROPLASTY Right 09/22/2017  ? Procedure: ARTHROPLASTY BIPOLAR HIP (HEMIARTHROPLASTY);  Surgeon: [name], MD;  Location: [place];  Service: Orthopedics;  Laterality: Right;  ? JOINT REPLACEMENT  11/20/2011  ? LTKR  ? ORIF FEMUR FRACTURE Right 12/20/2017  ? Procedure: OPEN REDUCTION INTERNAL FIXATION (ORIF) DISTAL FEMUR FRACTURE;  Surgeon: [name], MD;  Location: [place];  Service: Orthopedics;  Laterality: Right;  ? REVERSE SHOULDER ARTHROPLASTY Right 10/09/2017  ? REVERSE SHOULDER ARTHROPLASTY Right 10/09/2017  ? Procedure: REVERSE SHOULDER ARTHROPLASTY;  Surgeon: [name], MD;  Location: [place];  Service: Orthopedics;  Laterality: Right;  ? ?HPI:  ?Stephanie Donovan is a 88 y.o. female with medical history significant of  dementia, hypothyroidism, essential pretension status post  right AKA  secondary to infected hardware history of syncope in the past recurrent UTIs history of hypertension, chronic back pain, TIAs, A-fib with RVR in 2015, constipation, HLD. Presented with generalized fatigue weakness and syncopal event  Found to be COVID positive.  Per cxr, cannot rule out left developing airspace disease.  Pt denies dysphagia.  ?  ?Assessment / Plan / Recommendation  ?Clinical Impression ? Limited evaluation completed due to pt's nausea.  Pt does appear with right facial asymmetry- which she does not state if baseline- otherwise no focal CN defictis from tasks pt would complete.  Observed pt with intake including graham cracker, applesauce, and gingerale.  NO indication of airway compromise immediately post-swallow.  Pt denies dysphagia at baseline.   Delayed coughing -= did not appear related to po intake. Recommend continue diet as tolerated. No SLP follow up indicated. ?SLP Visit Diagnosis: Dysphagia, unspecified (R13.10) ?   ?Aspiration Risk ? Mild aspiration risk  ?  ?Diet Recommendation Regular;Thin liquid  ? ?Liquid Administration via: Cup;Straw ?Medication Administration: Whole meds with liquid ?Supervision: Patient able to self feed ?Compensations: Slow rate;Small sips/bites ?Postural Changes: Seated upright at 90 degrees;Remain upright for at least 30 minutes after po intake  ?  ?Other  Recommendations Oral Care Recommendations: Oral care BID   ? ?Recommendations for follow up therapy are one component of a multi-disciplinary discharge planning process, led by the attending physician.  Recommendations may be updated based on patient status, additional functional criteria and insurance authorization. ? ?  Follow up Recommendations No SLP follow up  ? ? ?  ?Assistance Recommended at Discharge  Full assist  ?Functional Status Assessment Patient has not had a recent decline in their functional status  ?Frequency and Duration   N/a ?  ?  ?   ? ?Prognosis   N/a ? ?  ? ?Swallow Study   ?General Date  of Onset: 06/02/21 ?HPI: Stephanie Donovan is a 88 y.o. 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 Presented with generalized fatigue weakness and syncopal event  Found to be COVID positive.  Per cxr, cannot rule out left developing airspace disease.  Pt denies dysphagia. ?Type of Study: Bedside Swallow Evaluation ?Diet Prior to this Study: Regular;Thin liquids ?Respiratory Status: Room air ?History of Recent Intubation: No ?Behavior/Cognition: Alert;Cooperative ?Oral Cavity Assessment: Within Functional Limits ?Oral Care Completed by SLP: No ?Oral Cavity - Dentition: Missing dentition;Other (Comment) (partials, some missing dentition) ?Vision: Functional for self-feeding ?Self-Feeding Abilities: Able to feed self ?Patient Positioning: Upright in bed ?Baseline Vocal Quality: Low vocal intensity ?Volitional Cough: Cognitively unable to elicit ?Volitional Swallow: Unable to elicit  ?  ?Oral/Motor/Sensory Function Overall Oral Motor/Sensory Function: Mild impairment ?Facial ROM: Within Functional Limits ?Facial Symmetry: Abnormal symmetry right ?Facial Strength: Within Functional Limits ?Lingual ROM: Within Functional Limits ?Lingual Symmetry: Within Functional Limits ?Lingual Sensation: Within Functional Limits ?Velum: Within Functional Limits   ?Ice Chips Ice chips: Not tested   ?Thin Liquid Thin Liquid: Within functional limits  ?  ?Nectar Thick Nectar Thick Liquid: Not tested   ?Honey Thick Honey Thick Liquid: Not tested   ?Puree Puree: Within functional limits ?Presentation: Self Fed;Spoon   ?Solid ? ? ?  Solid: Within functional limits ?Presentation: Self Fed  ? ?  ? ?[name] ?06/02/2021,10:58 AM ? ?Kathleen Lime, MS CCC SLP ?Acute Rehab Services ?Office  [phone] ?Pager [phone] ? ? ?

## 2021-06-02 NOTE — Evaluation (Signed)
Occupational Therapy Evaluation ?Patient Details ?Name: Stephanie Donovan ?MRN: [id number] ?DOB: 02-25-1927 ?Today's Date: 06/02/2021 ? ? ?History of Present Illness Stephanie Donovan is a 88 y.o. 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 Presented with generalized fatigue weakness and syncopal event  Found to be COVID positive.  ? ?Clinical Impression ?  ?Stephanie Donovan is a 21 year old woman who presents with generalized weakness and decreased activity tolerance. At baseline she lives with her daughter and is at wheelchair level. Patient is confused and not a reliable historian and no family in room. She reports she lives alone and [place]. She reports she does her ADLs and transfers but suspect she needs assistance. Unsure of she has 24/7 assistance at home. Currently she exhibits weak upper extremities and requires mod x 2 to pivot. Patient will benefit from skilled OT services while in hospital to improve deficits and learn compensatory strategies as needed in order to return to PLOF.  Recommend short term rehab to maximize physical abilities in order to reduce caregiver burden.  ?   ? ?Recommendations for follow up therapy are one component of a multi-disciplinary discharge planning process, led by the attending physician.  Recommendations may be updated based on patient status, additional functional criteria and insurance authorization.  ? ?Follow Up Recommendations ? Skilled nursing-short term rehab (<3 hours/day)  ?  ?Assistance Recommended at Discharge Frequent or constant Supervision/Assistance  ?Patient can return home with the following Two people to help with walking and/or transfers;A lot of help with  bathing/dressing/bathroom;Assistance with cooking/housework;Direct supervision/assist for financial management;Direct supervision/assist for medications management;Help with stairs or ramp for entrance;Assist for transportation ? ?  ?Functional Status Assessment ? Patient has had a recent decline in their functional status and demonstrates the ability to make significant improvements in function in a reasonable and predictable amount of time.  ?Equipment Recommendations ? None recommended by OT  ?  ?Recommendations for Other Services   ? ? ?  ?Precautions / Restrictions Precautions ?Precautions: Fall  ? ?  ? ?Mobility Bed Mobility ?Overal bed mobility: Needs Assistance ?Bed Mobility: Supine to Sit ?  ?  ?Supine to sit: Mod assist, HOB elevated, +2 for safety/equipment ?  ?  ?  ?  ? ?Transfers ?Overall transfer level: Needs assistance ?  ?Transfers: Bed to chair/wheelchair/BSC ?  ?  ?Squat pivot transfers: Mod assist, +2 physical assistance ?  ?  ?  ?General transfer comment: mod x 2 to pivot to recliner. ?  ? ?  ?Balance Overall balance assessment: Needs assistance ?Sitting-balance support: No upper extremity supported, Feet supported ?Sitting balance-Leahy Scale: Fair ?  ?  ?  ?  ?  ?  ?  ?  ?  ?  ?  ?  ?  ?  ?  ?  ?   ? ?ADL either performed or assessed with clinical judgement  ? ?ADL Overall ADL's : Needs assistance/impaired ?Eating/Feeding: Set up;Sitting ?  ?Grooming: Total assistance;Sitting ?  ?Upper Body Bathing: Set up;Sitting ?  ?Lower Body Bathing: Moderate assistance;Sitting/lateral leans ?  ?Upper Body Dressing : Set up;Sitting ?  ?Lower Body Dressing: Maximal assistance;Sitting/lateral leans ?  ?Toilet Transfer: Moderate assistance;+2 for physical assistance;BSC/3in1;Squat-pivot ?  ?Toileting- Clothing Manipulation and Hygiene: Sitting/lateral lean;Maximal assistance ?  ?  ?  ?  Functional mobility during ADLs: Moderate assistance;+2 for physical assistance ?   ? ? ? ?Vision Patient Visual Report: No  change from baseline ?   ?   ?Perception   ?  ?Praxis   ?  ? ?Pertinent Vitals/Pain Pain Assessment ?Pain Assessment: No/denies pain  ? ? ? ?Hand Dominance   ?  ?Extremity/Trunk Assessment Upper Extremity Assessment ?Upper Extremity Assessment: RUE deficits/detail;LUE deficits/detail ?RUE Deficits / Details: WFL ROM, grossly 4-/5 strength throughout ?RUE Sensation: WNL ?RUE Coordination: WNL ?LUE Deficits / Details: WFL ROM, grossly 4-/5 strength throughout ?LUE Sensation: WNL ?LUE Coordination: WNL ?  ?Lower Extremity Assessment ?Lower Extremity Assessment: Defer to PT evaluation ?  ?Cervical / Trunk Assessment ?Cervical / Trunk Assessment: Normal ?  ?Communication   ?  ?Cognition Arousal/Alertness: Awake/alert ?Behavior During Therapy: [facility] for tasks assessed/performed ?Overall Cognitive Status: History of cognitive impairments - at baseline ?  ?  ?  ?  ?  ?  ?  ?  ?  ?  ?  ?  ?  ?  ?  ?  ?General Comments: statsing " I guess so" multiple times during session when asked about PLOF. able to state current president, she is from [place], states "because of virus" when asked why therapist has to wear mask, knows mother's day is this sunday but unable to recall current month. History of AKA, ?  ?  ?General Comments    ? ?  ?Exercises   ?  ?Shoulder Instructions    ? ? ?Home Living Family/patient expects to be discharged to:: Private residence ?Living Arrangements: Alone ?Available Help at Discharge: Family ?Type of Home: Apartment (1st floor) ?  ?  ?  ?  ?  ?  ?  ?  ?  ?  ?  ?Home Equipment: Wheelchair - manual ?  ?Additional Comments: reports she prolpels w/c on her own. says "daughter works [place] the [place]" comes by to visit everyday ?  ? ?  ?Prior Functioning/Environment   ?  ?  ?  ?  ?  ?  ?Mobility Comments: w/c bound. " i guess do" when asked if she gets to w/c on her own. ?ADLs Comments: wears briefs at baseline. states she wheels to bathroom to use toilet, no grab bars. ?  ? ?  ?  ?OT Problem List: Decreased  strength;Decreased activity tolerance;Impaired balance (sitting and/or standing);Decreased cognition;Decreased safety awareness;Decreased knowledge of use of DME or AE;Obesity ?  ?   ?OT Treatment/Interventions: Self-care/ADL training;Therapeutic exercise;DME and/or AE instruction;Therapeutic activities;Balance training;Patient/family education;Cognitive remediation/compensation  ?  ?OT Goals(Current goals can be found in the care plan section) Acute Rehab OT Goals ?OT Goal Formulation: Patient unable to participate in goal setting ?Time For Goal Achievement: 06/16/21 ?Potential to Achieve Goals: Good  ?OT Frequency: Min 2X/week ?  ? ?Co-evaluation PT/OT/SLP Co-Evaluation/Treatment: Yes (coeval) ?  ?  ?  ?  ? ?  ?AM-PAC OT "6 Clicks" Daily Activity     ?Outcome Measure Help from another person eating meals?: A Little ?Help from another person taking care of personal grooming?: A Little ?Help from another person toileting, which includes using toliet, bedpan, or urinal?: Total ?Help from another person bathing (including washing, rinsing, drying)?: A Lot ?Help from another person to put on and taking off regular upper body clothing?: A Little ?Help from another person to put on and taking off regular lower body clothing?: A Lot ?6 Click Score: 14 ?  ?End of Session Nurse Communication: Mobility status ? ?Activity Tolerance: Patient  tolerated treatment well ?Patient left: in chair;with call bell/phone within reach;with chair alarm set ? ?OT Visit Diagnosis: Muscle weakness (generalized) (M62.81)  ?              ?Time: 4174-0814 ?OT Time Calculation (min): 24 min ?Charges:  OT General Charges ?$OT Visit: 1 Visit ?OT Evaluation ?$OT Eval Low Complexity: 1 Low ? ?Keyona Emrich, OTR/L ?Acute Care Rehab Services  ?Office [phone] ?Pager: [phone]  ? ?Keywon Mestre L Lourdez Mcgahan ?06/02/2021, 10:43 AM ?

## 2021-06-02 NOTE — Progress Notes (Signed)
?PROGRESS NOTE ? ? ? ?Stephanie Donovan  YDX:[id number] DOB: October 24, 1927 DOA: 06/01/2021 ?PCP: Clovia Cuff, MD  ? ? ? ?Brief Narrative:  ?88 y.o. 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  ?  ?Presented with   generalized fatigue weakness and syncopal event ?Presented to ER for cough lives with her daughter who tested positive for COVID today.  Patient at baseline wheelchair-bound status post AKA ?Patient was brought into transient and she noted to have a syncopal episode while sitting in the chair had weak carotid pulse and agonal respirations.  Patient was moved to resuscitation room and rapidly improved without any intervention. ?Patient states that she have not had much to eat today she has had some episodes of syncope while in assisted living.  She has been feeling fairly weak and congested.  Soft stool but no diarrhea.  No falls.  No chest pain or shortness of breath ? She is incontinent of urine and stool at baseline ?Dementia she gets worse at night ?She have had a few syncopal episodes over the past few years when her BP would go down  ?Her Covid symptoms only started yesterday ?She never had a fever ?Family has been trying to get her into assisted living ? ? ?Subjective: ?Afebrile overnight, A/O x4.  States does not believe that she can return home to live by herself.  Requests help in obtaining assisted living upon discharge. ? ? ?Assessment & Plan: ?Covid vaccination; VACCINATED ?  ?Principal Problem: ?  Syncope ?Active Problems: ?  CAP (community acquired pneumonia) ?  Paroxysmal A-fib ([place]) ?  COVID-19 virus infection ?  Hypothyroidism ?  Chronic diastolic CHF (congestive heart failure) ([place]) ?  Glaucoma ?  Hx of TIA (transient ischemic attack) and stroke ?  Dehydration ?  Hyponatremia ?  Acute lower UTI ?  Above knee amputation of right  lower extremity ([place]) ?  Atypical nevus of left lower leg ? ?  ?  ?Present on Admission: ? Syncope ? Hypothyroidism ? Glaucoma ? Paroxysmal A-fib ([place]) ? Hyponatremia ? Dehydration ? Chronic diastolic CHF (congestive heart failure) ([place]) ? Acute lower UTI ? CAP (community acquired pneumonia) ? COVID-19 virus infection ?  ?  ?  ?Syncope ?-Troponin negative, ?-5/12 echocardiogram pending  ?-Rehydrate and hold lasix ?-Given elevated d.dimer and covid diagnosis ?-5/12 CTA PE protocol pending  ?  ?Hypothyroidism ?-5/12 TSH pending ?- Synthroid 50 mcg daily ?  ?  ?Glaucoma ?-Stable continue home medications  ?  ?  ?Paroxysmal A-fib ([place]) ?-Now in sinus rhythm not on anticoagulation due to risk of falls ?-Currently NSR ?- PRN Metoprolol 12.5 mg ?  ?Hyponatremia ?-Mild asymptomatic hyponatremia.   ? ?Above knee amputation of right lower extremity ([place]) ?-Chronic, family states needs help at home  ?-Will have PT OT eval for needs may need placement ?-See goals of care ?  ?  ?Hx of TIA (transient ischemic attack) and stroke ?-Aspirin listed as allergy although family states they give it to her most of the days ?- ?  ?  ?Dehydration ?-Normal saline 41m/hr ?  ?Chronic diastolic CHF (congestive heart failure) [place] ?-Strict in and out ?- Daily weight ? ?Acute lower UTI/positive E. coli ?-Empiric Rocephin ?  ?  ?CAP (community acquired pneumonia) ?-Unclear if true infiltrate or repeat chest x-ray check procalcitonin.  For tonight, antibiotics check Legionella and  strep antigen ?If no evidence of typical pneumonia on chest x-ray/CT and normal procalcitonin consider discontinuing azithromycin ?-5/11 Procalcitonin<0.10, discontinue Azithromycin ? ?COVID-19 virus infection ?COVID-19 Labs ? ?Recent Labs  ?  06/01/21 ?1933 06/02/21 ?0335  ?DDIMER 2.03* 2.74*  ?FERRITIN 99  --   ?LDH 100  --   ?CRP 1.1* 1.5*  ? ? ?Lab Results  ?Component Value Date  ? SARSCOV2NAA POSITIVE (A) 06/01/2021  ? [place] NEGATIVE 10/10/2020  ? [place]  NEGATIVE 10/07/2020  ? [place] NEGATIVE 08/02/2020  ?   ?-Currently negative hypoxia however COVID inflammatory markers increasing.   ?-Continue Paxlovid  ?-Continuous pulse ox ?- Titrate O2 to maintain SPO2> 92% ? ? ?Atypical nevus of left lower leg ?-Pt will need t be referred to Dermatology as an outpt ? ?Refractory nausea ?- Zofran 4 mg PRN ?- Thorazine IV 12.5 mg PRN when Zofran ineffective ? ?Goals of care ?- 5/12 TOC consult placed:Requests help in obtaining assisted living upon discharge.  Given her disabilities does not believe she can live on her own. ? ? ?  ?Mobility Assessment (last 72 hours)   ? ? Mobility Assessment   ? ? [place] Name 06/01/21 1900  ?  ?  ?  ?  ? Does patient have an order for bedrest or is patient medically unstable No - Continue assessment      ? What is the highest level of mobility based on the progressive mobility assessment? Level 3 (Stands with assist) - Balance while standing  and cannot march in place      ? Is the above level different from baseline mobility prior to current illness? No - Consider discontinuing PT/OT      ? ?  ?  ? ?  ? ? ? ?'@IPAL'$ @ ? ?   ?DVT prophylaxis: Lovenox ?Code Status: DNR ?Family Communication:  ?Status is: Inpatient ? ? ? ?Dispo: The patient is from: Home ?             Anticipated d/c is to: ALF ?             Anticipated d/c date is: 3 days ?             Patient currently is not medically stable to d/c. ? ? ? ? ? ?Consultants:  ? ? ?Procedures/Significant Events:  ? ? ?I have personally reviewed and interpreted all radiology studies and my findings are as above. ? ?VENTILATOR SETTINGS: ?Room air 5/12 ?SPO2 97% ? ? ?Cultures ?5/11 influenza A/B negative ?5/12 positive SARS coronavirus ? ? ?Antimicrobials: ?Anti-infectives (From admission, onward)  ? ? Start     Ordered Stop  ? 06/02/21 1800  cefTRIAXone (ROCEPHIN) 1 g in sodium chloride 0.9 % 100 mL IVPB       ? 06/01/21 1934 06/07/21 1759  ? 06/01/21 2200  nirmatrelvir/ritonavir EUA (PAXLOVID) 3  tablet  Status:  Discontinued       ? 06/01/21 1605 06/01/21 1814  ? 06/01/21 2200  nirmatrelvir/ritonavir EUA (renal dosing) (PAXLOVID) 2 tablet       ? 06/01/21 1814 06/06/21 2159  ? 06/01/21 2100  azithromycin (ZITHROMAX) 500 mg in sodium chloride 0.9 % 250 mL IVPB  Status:  Discontinued       ? 06/01/21 1934 06/01/21 2131  ? 06/01/21 1615  cefTRIAXone (ROCEPHIN) 1 g in sodium chloride 0.9 % 100 mL IVPB       ? 06/01/21 1606 06/01/21 1900  ? ?  ?  ? ?Devices ?  ? ?LINES /  TUBES:  ? ? ? ? ?Continuous Infusions: ? cefTRIAXone (ROCEPHIN)  IV    ? ? ? ?Objective: ?Vitals:  ? 06/01/21 1825 06/01/21 2144 06/02/21 0207 06/02/21 0700  ?BP: 130/78 108/86 129/68 132/73  ?Pulse: 95 100 80 78  ?Resp: '18 18 19 18  '$ ?Temp: 98.1 ?F (36.7 ?C) 98.4 ?F (36.9 ?C) 98.7 ?F (37.1 ?C) 98.3 ?F (36.8 ?C)  ?TempSrc: Oral Oral Oral Oral  ?SpO2: 96% 95% 95% 98%  ?Weight:      ?Height:      ? ? ?Intake/Output Summary (Last 24 hours) at 06/02/2021 0758 ?Last data filed at 06/02/2021 0748 ?Gross per 24 hour  ?Intake --  ?Output 450 ml  ?Net -450 ml  ? ?Filed Weights  ? 06/01/21 1228  ?Weight: 63.5 kg  ? ? ?Examination: ? ?General: A/O x4 No acute respiratory distress ?Eyes: negative scleral hemorrhage, negative anisocoria, negative icterus ?ENT: Negative Runny nose, negative gingival bleeding, ?Neck:  Negative scars, masses, torticollis, lymphadenopathy, JVD ?Lungs: Clear to auscultation bilaterally without wheezes or crackles ?Cardiovascular: Regular rate and rhythm without murmur gallop or rub normal S1 and S2 ?Abdomen: negative abdominal pain, nondistended, positive soft, bowel sounds, no rebound, no ascites, no appreciable mass ?Extremities: RIGHT AKA well-healed stump ?Skin: Negative rashes, lesions, ulcers ?Psychiatric:  Negative depression, negative anxiety, negative fatigue, negative mania  ?Central nervous system:  Cranial nerves II through XII intact, tongue/uvula midline, all extremities muscle strength 5/5, sensation intact  throughout,  negative dysarthria, negative xpressive aphasia, negative receptive aphasia. ? ?.  ? ? ? ?Data Reviewed: Care during the described time interval was provided by me .  I have reviewed this patient's av

## 2021-06-02 NOTE — Progress Notes (Signed)
Initial Nutrition Assessment ? ?DOCUMENTATION CODES:  ? ?Not applicable ? ?INTERVENTION:  ?- Liberalize diet from a heart healthy to a 2g sodium diet to provide widest variety of menu options to enhance nutritional adequacy ? ?- Ensure Enlive po BID, each supplement provides 350 kcal and 20 grams of protein. ? ?- MVI with minerals daily ? ?NUTRITION DIAGNOSIS:  ? ?Increased nutrient needs related to acute illness as evidenced by estimated needs. ? ?GOAL:  ? ?Patient will meet greater than or equal to 90% of their needs ? ?MONITOR:  ? ?PO intake, Supplement acceptance, Diet advancement, Labs, Weight trends ? ?REASON FOR ASSESSMENT:  ? ?Consult ?Assessment of nutrition requirement/status ? ?ASSESSMENT:  ? ?Pt admitted with generalized fatigue weakness and syncopal event, as well as COVID exposure. PMH significant for dementia, hypothyroidism, essential pretension, s/p R AKA d/t infected hardware, h/o syncope, recurrent UTI's, HTN, chronic back pain, TIAs, afib with RVR, constipation and HLD. ? ?SLP performed BSE. Recommend regular diet with thin liquids. Pt noted to have nausea. ? ?Unsuccessful attempts to reach pt via phone call to room.  ? ?Meal completion: ?05/12: 100%-breakfast ? ?Reviewed weight history. Current admit weight 63.5 kg. Her weight appears to be stable within the past year. Will continue to monitor throughout admission. ? ?Medications: synthroid, IV rocephin ? ?Labs: sodium 133, Cr 0.34 ? ?NUTRITION - FOCUSED PHYSICAL EXAM: ?RD working remotely. Deferred to follow up.  ? ?Diet Order:   ?Diet Order   ? ?       ?  Diet Heart Room service appropriate? Yes; Fluid consistency: Thin  Diet effective now       ?  ? ?  ?  ? ?  ? ? ?EDUCATION NEEDS:  ? ?No education needs have been identified at this time ? ?Skin:  Skin Assessment: Reviewed RN Assessment ? ?Last BM:  5/11 ? ?Height:  ? ?Ht Readings from Last 1 Encounters:  ?06/01/21 5' (1.524 m)  ? ? ?Weight:  ? ?Wt Readings from Last 1 Encounters:  ?06/01/21  63.5 kg  ? ?BMI:  Body mass index is 21.40 kg/m?. (BMI adj for R AKA) ? ?Estimated Nutritional Needs:  ? ?Kcal:  1400-1600 ? ?Protein:  75-90g ? ?Fluid:  >/=1.5L ? ?[place] Dana, RDN, LDN ?Clinical Nutrition ?

## 2021-06-02 NOTE — Progress Notes (Signed)
? ? ?  OVERNIGHT PROGRESS REPORT ? ?Imaging follow up : ? ? ? ?"FINDINGS: ?Brain: No evidence of acute infarction, hemorrhage, hydrocephalus, ?extra-axial collection or mass lesion/mass effect. Patchy white ?matter hypoattenuation, nonspecific compatible with chronic ?microvascular ischemic disease. ?  ?Vascular: No hyperdense vessel identified. Calcific intracranial ?atherosclerosis. ?  ?Skull: No acute fracture. ?  ?Sinuses/Orbits: Clear visualized sinuses. No acute orbital findings. ?  ?Other: No mastoid effusions. ?  ?IMPRESSION: ?No evidence of acute intracranial abnormality. ?  ?  ?Electronically Signed ?  By: Margaretha Sheffield M.D. ?  On: 06/01/2021 13:09 " ?  ? ? ? ? ? ? ?Gershon Cull MSNA MSN ACNPC-AG ?Acute Care Nurse Practitioner ?[facility] ?[address] ? ? ? ?

## 2021-06-02 NOTE — Consult Note (Signed)
? ?                                                                                ?Consultation Note ?Date: 06/02/2021  ? ?Patient Name: Stephanie Donovan  ?DOB: 06/08/1927  MRN: [id number]  Age / Sex: 88 y.o., female  ?PCP: Clovia Cuff, MD ?Referring Physician: Allie Bossier, MD ? ?Reason for Consultation: Establishing goals of care ? ?HPI/Patient Profile: 88 y.o. 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  ? ?Patient found to be positive for COVID in the ED.  Possibly with recurrent UTI.  Also had witnessed syncopal event in the ED.  PMT has been consulted to assist with goals of care conversation. ? ?Clinical Assessment and Goals of Care: ? ?I have reviewed medical records including EPIC notes, labs and imaging, assessed the patient and then called patient's daughter [place] to discuss diagnosis prognosis, [place], EOL wishes, disposition and options. ? ?I introduced Palliative Medicine as specialized medical care for people living with serious illness. It focuses on providing relief from the symptoms and stress of a serious illness. The goal is to improve quality of life for both the patient and the family. ? ?We discussed a brief life review of the patient and then focused on their current illness. The natural disease trajectory and expectations at EOL were discussed. ? ?I attempted to elicit values and goals of care important to the patient.   ? ?Medical History Review and Understanding: ?Discussed with patient's daughter [place] her concern regarding patient's recurrent UTIs over the past 2 to 3 months.  She feels patient has been stable otherwise.  We discussed dementia and typical expectations, signs for progression to  end-stage. ? ?Social History: ?Patient lives at home with her daughter.  She tells me she also has a son but with his own issues. ? ?Functional and Nutritional State: ?Patient is wheelchair-bound at baseline.  Tells me her appetite has been good.  Daughter tells me cognitively she is gradually declining. ? ?Advance Directives: ?A detailed discussion regarding advanced directives was had.  Patient's daughter [place] tells me she is HCPOA. ? ? ?Code Status: ?Concepts specific to code status, artifical feeding and hydration, and rehospitalization were considered and discussed.  Patient's daughter [place] agrees with appropriateness of DNR, limited medical interventions with no extreme measures/no ICU, and trials of IV fluids and IV antibiotics.  She would not want a feeding tube for her mother but wants to confirm with the patient before finalizing a MOST form. ? ?Discussion: ?Patient's daughter [place] is very hopeful that a physician who is working on a letter for ALF will be able to help with approval.  She expects to find out about this in the coming days.  She has her own health issues and it is getting increasingly more difficult to care for the patient.  She is worried that ALF may not be able to change her diaper 3 times a day, which is how she  cares for her mother at home.  She also tells me she would be able to care for the patient at home for a little while longer if this does not work out.  Overall things are going well with "hiccups" when UTIs occur.  She feels patient's quality of life will improve being around more people at ALF with activities to participate in.  She does not want to prolong the dying process and only wishes for limited interventions as long as patient's quality of life is acceptable.  She feels she will be open to hospice later on, preferring outpatient palliative care at this time.  She tells me patient still has the willpower to live and if this changes she will readdress goals of  care. ? ? ? ?The difference between aggressive medical intervention and comfort care was considered in light of the patient's goals of care. Hospice and Palliative Care services outpatient were explained and offered.  ? ?Discussed the importance of continued conversation with family and the medical providers regarding overall plan of care and treatment options, ensuring decisions are within the context of the patient?s values and GOCs.  ? ?Questions and concerns were addressed. The family was encouraged to call with questions or concerns.  PMT will continue to support holistically.  ?  ? ?SUMMARY OF RECOMMENDATIONS   ?-DNR confirmed ?-Goal is to move patient to ALF for support, socialization, activities ?-Outpatient palliative care referral at discharge ?-A MOST form was introduced today, will follow-up for completion ?-Psychosocial and emotional support provided ?-PMT will continue to follow ? ?Prognosis:  ?Unable to determine ? ?Discharge Planning: To Be Determined  ? ?  ? ?Primary Diagnoses: ?Present on Admission: ? Syncope ? Hypothyroidism ? Glaucoma ? Paroxysmal A-fib ([place]) ? Hyponatremia ? Dehydration ? Chronic diastolic CHF (congestive heart failure) ([place]) ? Acute lower UTI ? CAP (community acquired pneumonia) ? COVID-19 virus infection ? Atypical nevus of left lower leg ? ? ?I have reviewed the medical record, interviewed the patient and family, and examined the patient. The following aspects are pertinent. ? ?Past Medical History:  ?Diagnosis Date  ? Anemia   ? Blood transfusion without reported diagnosis   ? CHF (congestive heart failure) ([place])   ? Chronic back pain   ? Chronic constipation   ? Cognitive communication deficit   ? Dysrhythmia 2015  ? Afib with RVR - 2015, Oaroxyomal Afib, no current meds due do GI Bleed  ? GI bleed   ? Glaucoma   ? HLD (hyperlipidemia)   ? Hypertension   ? Hypothyroidism   ? Stroke [facility]) 08/2014  ? mini stroke  ? Syncope 2015  ? Thyroid disease   ? TIA (transient ischemic  attack) 2015  ? ?Social History  ? ?Socioeconomic History  ? Marital status: Widowed  ?  Spouse name: Not on file  ? Number of children: Not on file  ? Years of education: Not on file  ? Highest education level: Not on file  ?Occupational History  ? Not on file  ?Tobacco Use  ? Smoking status: Never  ? Smokeless tobacco: Never  ?Vaping Use  ? Vaping Use: Never used  ?Substance and Sexual Activity  ? Alcohol use: No  ? Drug use: No  ? Sexual activity: Not Currently  ?  Birth control/protection: Post-menopausal  ?Other Topics Concern  ? Not on file  ?Social History Narrative  ? Not on file  ? ?Social Determinants of Health  ? ?Financial Resource Strain: Not on file  ?  Food Insecurity: Not on file  ?Transportation Needs: Not on file  ?Physical Activity: Not on file  ?Stress: Not on file  ?Social Connections: Not on file  ? ?Family History  ?Problem Relation Age of Onset  ? Osteoporosis Mother   ? Cancer - Colon Father   ? Schizophrenia Son   ? ?Scheduled Meds: ? brimonidine  1 drop Left Eye BID  ? enoxaparin (LOVENOX) injection  40 mg Subcutaneous Q24H  ? levothyroxine  50 mcg Oral QAC breakfast  ? nirmatrelvir/ritonavir EUA (renal dosing)  2 tablet Oral BID  ? sodium chloride (PF)      ? timolol  1 drop Both Eyes BID  ? ?Continuous Infusions: ? cefTRIAXone (ROCEPHIN)  IV    ? chlorproMAZINE (THORAZINE) IV    ? ?PRN Meds:.acetaminophen **OR** acetaminophen, albuterol, chlorproMAZINE (THORAZINE) IV, guaiFENesin-dextromethorphan, HYDROcodone-acetaminophen, metoprolol succinate, ondansetron **OR** ondansetron (ZOFRAN) IV ?Medications Prior to Admission:  ?Prior to Admission medications   ?Medication Sig Start Date End Date Taking? Authorizing Provider  ?brimonidine (ALPHAGAN P) 0.1 % SOLN Place 1 drop into the left eye in the morning and at bedtime.   Yes [provider]  ?ferrous sulfate 325 (65 FE) MG EC tablet Take 325 mg by mouth daily.   Yes [provider]  ?furosemide (LASIX) 20 MG tablet Take 20  mg by mouth daily.   Yes [provider]  ?levothyroxine (SYNTHROID) 50 MCG tablet Take 50 mcg by mouth daily before breakfast.   Yes [provider]  ?metoprolol succinate (TOPROL-XL) 25 MG 24 hr table

## 2021-06-03 DIAGNOSIS — N390 Urinary tract infection, site not specified: Secondary | ICD-10-CM | POA: Diagnosis not present

## 2021-06-03 DIAGNOSIS — R55 Syncope and collapse: Secondary | ICD-10-CM | POA: Diagnosis not present

## 2021-06-03 DIAGNOSIS — S78111A Complete traumatic amputation at level between right hip and knee, initial encounter: Secondary | ICD-10-CM | POA: Diagnosis not present

## 2021-06-03 DIAGNOSIS — U071 COVID-19: Secondary | ICD-10-CM | POA: Diagnosis not present

## 2021-06-03 LAB — CBC WITH DIFFERENTIAL/PLATELET
Abs Immature Granulocytes: 0.03 10*3/uL (ref 0.00–0.07)
Basophils Absolute: 0 10*3/uL (ref 0.0–0.1)
Basophils Relative: 1 %
Eosinophils Absolute: 0 10*3/uL (ref 0.0–0.5)
Eosinophils Relative: 1 %
HCT: 36.7 % (ref 36.0–46.0)
Hemoglobin: 11.6 g/dL — ABNORMAL LOW (ref 12.0–15.0)
Immature Granulocytes: 1 %
Lymphocytes Relative: 21 %
Lymphs Abs: 0.7 10*3/uL (ref 0.7–4.0)
MCH: 28 pg (ref 26.0–34.0)
MCHC: 31.6 g/dL (ref 30.0–36.0)
MCV: 88.6 fL (ref 80.0–100.0)
Monocytes Absolute: 0.6 10*3/uL (ref 0.1–1.0)
Monocytes Relative: 16 %
Neutro Abs: 2.1 10*3/uL (ref 1.7–7.7)
Neutrophils Relative %: 60 %
Platelets: UNDETERMINED 10*3/uL (ref 150–400)
RBC: 4.14 MIL/uL (ref 3.87–5.11)
RDW: 14 % (ref 11.5–15.5)
WBC: 3.5 10*3/uL — ABNORMAL LOW (ref 4.0–10.5)
nRBC: 0 % (ref 0.0–0.2)

## 2021-06-03 LAB — COMPREHENSIVE METABOLIC PANEL
ALT: 19 U/L (ref 0–44)
AST: 23 U/L (ref 15–41)
Albumin: 2.7 g/dL — ABNORMAL LOW (ref 3.5–5.0)
Alkaline Phosphatase: 81 U/L (ref 38–126)
Anion gap: 8 (ref 5–15)
BUN: 10 mg/dL (ref 8–23)
CO2: 26 mmol/L (ref 22–32)
Calcium: 8.2 mg/dL — ABNORMAL LOW (ref 8.9–10.3)
Chloride: 96 mmol/L — ABNORMAL LOW (ref 98–111)
Creatinine, Ser: 0.36 mg/dL — ABNORMAL LOW (ref 0.44–1.00)
GFR, Estimated: >60 mL/min (ref >60)
Glucose, Bld: 92 mg/dL (ref 70–99)
Potassium: 3.6 mmol/L (ref 3.5–5.1)
Sodium: 130 mmol/L — ABNORMAL LOW (ref 135–145)
Total Bilirubin: 0.6 mg/dL (ref 0.3–1.2)
Total Protein: 5.8 g/dL — ABNORMAL LOW (ref 6.5–8.1)

## 2021-06-03 LAB — C-REACTIVE PROTEIN: CRP: 2.7 mg/dL — ABNORMAL HIGH (ref <1.0)

## 2021-06-03 LAB — URINE CULTURE: Culture: >=100000 — AB

## 2021-06-03 LAB — D-DIMER, QUANTITATIVE: D-Dimer, Quant: 1.63 ug/mL-FEU — ABNORMAL HIGH (ref 0.00–0.50)

## 2021-06-03 LAB — LACTATE DEHYDROGENASE: LDH: 129 U/L (ref 98–192)

## 2021-06-03 LAB — MAGNESIUM: Magnesium: 1.8 mg/dL (ref 1.7–2.4)

## 2021-06-03 LAB — PHOSPHORUS: Phosphorus: 3.2 mg/dL (ref 2.5–4.6)

## 2021-06-03 LAB — FERRITIN: Ferritin: 116 ng/mL (ref 11–307)

## 2021-06-03 NOTE — Progress Notes (Signed)
?PROGRESS NOTE ? ? ? ?Stephanie Donovan  ZJQ:[id number] DOB: 06/14/1927 DOA: 06/01/2021 ?PCP: Clovia Cuff, MD  ? ? ? ?Brief Narrative:  ?88 y.o. 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  ?  ?Presented with   generalized fatigue weakness and syncopal event ?Presented to ER for cough lives with her daughter who tested positive for COVID today.  Patient at baseline wheelchair-bound status post AKA ?Patient was brought into transient and she noted to have a syncopal episode while sitting in the chair had weak carotid pulse and agonal respirations.  Patient was moved to resuscitation room and rapidly improved without any intervention. ?Patient states that she have not had much to eat today she has had some episodes of syncope while in assisted living.  She has been feeling fairly weak and congested.  Soft stool but no diarrhea.  No falls.  No chest pain or shortness of breath ? She is incontinent of urine and stool at baseline ?Dementia she gets worse at night ?She have had a few syncopal episodes over the past few years when her BP would go down  ?Her Covid symptoms only started yesterday ?She never had a fever ?Family has been trying to get her into assisted living ? ? ?Subjective: ?5/13 afebrile overnight A/O x4.  Again restates does not believe that she can return home to live by herself.  Requests help in obtaining assisted living upon discharge. ? ? ?Assessment & Plan: ?Covid vaccination; VACCINATED ?  ?Principal Problem: ?  Syncope ?Active Problems: ?  CAP (community acquired pneumonia) ?  Paroxysmal A-fib ([place]) ?  COVID-19 virus infection ?  Hypothyroidism ?  Chronic diastolic CHF (congestive heart failure) ([place]) ?  Glaucoma ?  Hx of TIA (transient ischemic attack) and stroke ?  Dehydration ?  Hyponatremia ?  Acute lower UTI ?  Above knee  amputation of right lower extremity ([place]) ?  Atypical nevus of left lower leg ?  E. coli UTI (urinary tract infection) ? ?  ?  ?Present on Admission: ? Syncope ? Hypothyroidism ? Glaucoma ? Paroxysmal A-fib ([place]) ? Hyponatremia ? Dehydration ? Chronic diastolic CHF (congestive heart failure) ([place]) ? Acute lower UTI ? CAP (community acquired pneumonia) ? COVID-19 virus infection ?  ?  ?  ?Syncope ?-Troponin negative, ?-5/12 echocardiogram pending  ?-Rehydrate and hold lasix ?-Given elevated d.dimer and covid diagnosis ?-5/12 CTA PE protocol negative for PE see results below ?  ?Hypothyroidism/Thyroid Nodule ?-5/12 TSH pending ?- Synthroid 50 mcg daily ?  ?  ?Glaucoma ?-Stable continue home medications  ? ?Chronic diastolic CHF (congestive heart failure) ([place]) ?-Strict in and out +1.4 L ?- Daily weight ?  ?  ?Paroxysmal A-fib ([place]) ?-Now in sinus rhythm not on anticoagulation due to risk of falls ?-Currently NSR ?- PRN Metoprolol 12.5 mg ?  ?Hyponatremia ?-Mild asymptomatic hyponatremia.   ? ?Above knee amputation of right lower extremity ([place]) ?-Chronic, family states needs help at home  ?-Will have PT OT eval for needs may need placement ?-See goals of care ?  ?  ?Hx of TIA (transient ischemic attack) and stroke ?-Aspirin listed as allergy although family states they give it to her most of the days ?  ?Dehydration ?-Normal saline 68m/hr ?  ? ?Acute lower UTI/positive E. coli ?- Complete 5-day course Rocephin ?  ?CAP (community acquired pneumonia) ?-Unclear  if true infiltrate or repeat chest x-ray check procalcitonin.  For tonight, antibiotics check Legionella and strep antigen ?If no evidence of typical pneumonia on chest x-ray/CT and normal procalcitonin consider discontinuing azithromycin ?-5/11 Procalcitonin<0.10, discontinue Azithromycin ? ?COVID-19 virus infection ?COVID-19 Labs ? ?Recent Labs  ?  06/01/21 ?1933 06/02/21 ?7169 06/03/21 ?0416  ?DDIMER 2.03* 2.74* 1.63*  ?FERRITIN 99  --  116  ?LDH 100  --  129   ?CRP 1.1* 1.5* 2.7*  ? ? ? ?Lab Results  ?Component Value Date  ? SARSCOV2NAA POSITIVE (A) 06/01/2021  ? [place] NEGATIVE 10/10/2020  ? [place] NEGATIVE 10/07/2020  ? [place] NEGATIVE 08/02/2020  ? ?-Patient not hypoxic, and inflammatory markers trending down ?- Complete course Paxlovid per pharmacy protocol ?-Continuous pulse ox ?- Titrate O2 to maintain SPO2> 92% ? ?Atypical nevus of left lower leg ?-Pt will need t be referred to Dermatology as an outpt ? ?Refractory nausea ?- Zofran 4 mg PRN ?- Thorazine IV 12.5 mg PRN when Zofran ineffective ? ?Goals of care ?- 5/12 TOC consult placed:Requests help in obtaining assisted living upon discharge.  Given her disabilities does not believe she can live on her own. ? ? ?  ?Mobility Assessment (last 72 hours)   ? ? Mobility Assessment   ? ? [place] Name 06/03/21 1100 06/03/21 0821 06/02/21 2000 06/02/21 1041 06/02/21 1014  ? Does patient have an order for bedrest or is patient medically unstable -- No - Continue assessment No - Continue assessment -- No - Continue assessment  ? What is the highest level of mobility based on the progressive mobility assessment? Level 2 ([place]) - Balance while sitting on edge of bed and cannot stand Level 2 ([place]) - Balance while sitting on edge of bed and cannot stand Level 2 (Chairfast) - Balance while sitting on edge of bed and cannot stand Level 2 (Chairfast) - Balance while sitting on edge of bed and cannot stand Level 2 (Chairfast) - Balance while sitting on edge of bed and cannot stand  ? Is the above level different from baseline mobility prior to current illness? -- No - Consider discontinuing PT/OT No - Consider discontinuing PT/OT -- No - Consider discontinuing PT/OT  ? ? [place] Name 06/02/21 0900 06/01/21 1900  ?  ?  ?  ? Does patient have an order for bedrest or is patient medically unstable -- No - Continue assessment     ? What is the highest level of mobility based on the progressive mobility assessment?  Level 2 ([place]) - Balance while sitting on edge of bed and cannot stand Level 3 (Stands with assist) - Balance while standing  and cannot march in place     ? Is the above level different from baseline mobility prior to current illness? -- No - Consider discontinuing PT/OT     ? ?  ?  ? ?  ? ? ? ?'@IPAL'$ @ ? ?   ?DVT prophylaxis: Lovenox ?Code Status: DNR ?Family Communication:  ?Status is: Inpatient ? ? ? ?Dispo: The patient is from: Home ?             Anticipated d/c is to: ALF ?             Anticipated d/c date is: 3 days ?             Patient currently is not medically stable to d/c. ? ? ? ? ? ?Consultants:  ? ? ?Procedures/Significant Events:  ?5/12 CTA PE protocol ?No evidence of  pulmonary embolism or other acute cardiopulmonary ?pathology. ?2. Multiple chronic appearing vertebral body compression fractures ?with no definite acute fracture. ?3. Cholelithiasis without evidence of acute cholecystitis. ?4. Mild bilateral hydronephrosis, incompletely evaluated. Consider ?CT abdomen/pelvis or renal ultrasound as indicated. ?5. Enlarged left thyroid lobe with a 3.7 cm nodule. Thyroid ?ultrasound may be considered as indicated given patient age. ? ?I have personally reviewed and interpreted all radiology studies and my findings are as above. ? ?VENTILATOR SETTINGS: ?Room air 5/13 ?SPO2 97% ? ? ?Cultures ?5/11 influenza A/B negative ?5/12 positive SARS coronavirus ? ? ?Antimicrobials: ?Anti-infectives (From admission, onward)  ? ? Start     Ordered Stop  ? 06/02/21 1800  cefTRIAXone (ROCEPHIN) 1 g in sodium chloride 0.9 % 100 mL IVPB       ? 06/01/21 1934 06/07/21 1759  ? 06/01/21 2200  nirmatrelvir/ritonavir EUA (PAXLOVID) 3 tablet  Status:  Discontinued       ? 06/01/21 1605 06/01/21 1814  ? 06/01/21 2200  nirmatrelvir/ritonavir EUA (renal dosing) (PAXLOVID) 2 tablet       ? 06/01/21 1814 06/06/21 2159  ? 06/01/21 2100  azithromycin (ZITHROMAX) 500 mg in sodium chloride 0.9 % 250 mL IVPB  Status:  Discontinued        ? 06/01/21 1934 06/01/21 2131  ? 06/01/21 1615  cefTRIAXone (ROCEPHIN) 1 g in sodium chloride 0.9 % 100 mL IVPB       ? 06/01/21 1606 06/01/21 1900  ? ?  ?  ? ?Devices ?  ? ?LINES / TUBES:  ? ? ? ? ?Continuous I

## 2021-06-03 NOTE — TOC Progression Note (Signed)
Transition of Care (TOC) - Progression Note  ? ? ?Patient Details  ?Name: Stephanie Donovan ?MRN: [id number] ?Date of Birth: 08-06-1927 ? ?Transition of Care (TOC) CM/SW Contact  ?Purcell Mouton, RN ?Phone Number: ?06/03/2021, 2:08 PM ? ?Clinical Narrative:    ? ?Spoke with patient's daughter [place] concerning discharge plan and SNF. Daughter states, "I am working on getting my mother in ALF. I will know on Monday.The doctor is filling out her FL2." TOC will continue to follow.  ? ? ?Expected Discharge Plan: [place] ?Barriers to Discharge: No Barriers Identified ? ?Expected Discharge Plan and Services ?Expected Discharge Plan: [place] ?  ?  ?Post Acute Care Choice: [place] ?Living arrangements for the past 2 months: [place] ?                ?  ?  ?  ?  ?  ?  ?  ?  ?  ?  ? ? ?Social Determinants of Health (SDOH) Interventions ?  ? ?Readmission Risk Interventions ?   ? View : No data to display.  ?  ?  ?  ? ? ?

## 2021-06-03 NOTE — Progress Notes (Signed)
Physical Therapy Treatment ?Patient Details ?Name: Stephanie Donovan ?MRN: [id number] ?DOB: 02/22/1927 ?Today's Date: 06/03/2021 ? ? ?History of Present Illness Stephanie Donovan is a 88 y.o. 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 Presented with generalized fatigue weakness and syncopal event  Found to be COVID positive. ? ?  ?PT Comments  ? ? Today's physical therapy treatment focused on bed mobility and transfers. Pt with improved ability to come to sitting EOB, decreased assist required (HOB elevated and pt with use of bed rails).  Pt performed STS transfers x3 with MOD A+2 regressing to increased MAX A+2 to maintain standing when fatigued. Continue to recommend short term rehab stay upon d/c. Pt will benefit from continued skilled PT to increase her independence and maximize safety with mobility.  ?   ?Recommendations for follow up therapy are one component of a multi-disciplinary discharge planning process, led by the attending physician.  Recommendations may be updated based on patient status, additional functional criteria and insurance authorization. ? ?Follow Up Recommendations ? Skilled nursing-short term rehab (<3 hours/day) ?  ?  ?Assistance Recommended at Discharge Frequent or constant Supervision/Assistance  ?Patient can return home with the following Two people to help with walking and/or transfers;A lot of help with bathing/dressing/bathroom;Assistance with cooking/housework;Direct supervision/assist for medications management;Direct supervision/assist for financial management;Assist for transportation;Help with stairs or ramp for entrance ?  ?Equipment Recommendations ? None recommended by PT  ?  ?Recommendations for Other Services   ? ? ?  ?Precautions /  Restrictions Precautions ?Precautions: Fall ?Restrictions ?Weight Bearing Restrictions: No  ?  ? ?Mobility ? Bed Mobility ?Overal bed mobility: Needs Assistance ?Bed Mobility: Supine to Sit ?  ?  ?Supine to sit: HOB elevated, Min assist ?  ?  ?General bed mobility comments: use of bed rails and increased time. Difficulty with performign lateral scoots along EOB with use of B UEs and intermittent assist of L LE on floor. ?  ? ?Transfers ?Overall transfer level: Needs assistance ?Equipment used: Rolling walker (2 wheels) ?Transfers: Bed to chair/wheelchair/BSC, Sit to/from Stand ?Sit to Stand: Mod assist, Max assist, +2 physical assistance, +2 safety/equipment ?  ?  ?Squat pivot transfers: Mod assist, +2 physical assistance, +2 safety/equipment ?  ?  ?General transfer comment: MOD A+2 to pivot to recliner chair with cues for UE placement for pt to assist. STS x3 during session to assist with pericare. MOD-MAX A+2 to maintain standing when fatigued. RW provided to allow pt to assist with UEs in standing for pericare. No AD use for squat pivot transfers. ?  ? ?Ambulation/Gait ?  ?  ?  ?  ?  ?  ?  ?  ? ? ?Stairs ?  ?  ?  ?  ?  ? ? ?Wheelchair Mobility ?  ? ?Modified Rankin (Stroke Patients Only) ?  ? ? ?  ?Balance Overall balance assessment: Needs assistance ?Sitting-balance support: No upper extremity supported, Feet supported ?Sitting balance-Leahy Scale: Fair ?  ?  ?Standing balance support: Bilateral upper extremity supported ?Standing balance-Leahy Scale: Zero ?  ?  ?  ?  ?  ?  ?  ?  ?  ?  ?  ?  ?  ? ?  ?Cognition Arousal/Alertness: Awake/alert ?Behavior During Therapy: [facility] for tasks assessed/performed ?Overall Cognitive Status: History of  cognitive impairments - at baseline ?  ?  ?  ?  ?  ?  ?  ?  ?  ?  ?  ?  ?  ?  ?  ?  ?General Comments: stating " I don't klnow" to questinos about PLOF. Today pt reporting that she lives with her daughter (stated she lived alone during eval yesterday). Did reiterate that her  [place] and states no additional assist at home while daughter is working- pt is a questionable historian ?  ?  ? ?  ?Exercises   ? ?  ?General Comments   ?  ?  ? ?Pertinent Vitals/Pain Pain Assessment ?Pain Assessment: No/denies pain  ? ? ?Home Living   ?  ?  ?  ?  ?  ?  ?  ?  ?  ?   ?  ?Prior Function    ?  ?  ?   ? ?PT Goals (current goals can now be found in the care plan section) Acute Rehab PT Goals ?Patient Stated Goal: none stated at this time ?PT Goal Formulation: Patient unable to participate in goal setting ?Time For Goal Achievement: 06/16/21 ?Potential to Achieve Goals: Good ?Progress towards PT goals: Progressing toward goals ? ?  ?Frequency ? ? ? Min 2X/week ? ? ? ?  ?PT Plan Current plan remains appropriate  ? ? ?Co-evaluation   ?  ?  ?  ?  ? ?  ?AM-PAC PT "6 Clicks" Mobility   ?Outcome Measure ? Help needed turning from your back to your side while in a flat bed without using bedrails?: A Lot ?Help needed moving from lying on your back to sitting on the side of a flat bed without using bedrails?: Total ?Help needed moving to and from a bed to a chair (including a wheelchair)?: Total ?Help needed standing up from a chair using your arms (e.g., wheelchair or bedside chair)?: Total ?Help needed to walk in hospital room?: Total (w/c bound since AKA) ?Help needed climbing 3-5 steps with a railing? : Total ?6 Click Score: 7 ? ?  ?End of Session Equipment Utilized During Treatment: Gait belt ?Activity Tolerance: Patient tolerated treatment well ?Patient left: in chair;with call bell/phone within reach;with chair alarm set;with nursing/sitter in room ?Nurse Communication: Mobility status ?PT Visit Diagnosis: Unsteadiness on feet (R26.81);Other abnormalities of gait and mobility (R26.89);Muscle weakness (generalized) (M62.81) ?  ? ? ?Time: 7353-2992 ?PT Time Calculation (min) (ACUTE ONLY): 16 min ? ?Charges:  $Therapeutic Activity: 8-22 mins          ?          ?Festus Barren., PT, DPT  ?Acute Rehabilitation Services  ?Office [phone] ? ?06/03/2021, 11:54 AM ? ?

## 2021-06-04 DIAGNOSIS — S78111A Complete traumatic amputation at level between right hip and knee, initial encounter: Secondary | ICD-10-CM | POA: Diagnosis not present

## 2021-06-04 DIAGNOSIS — U071 COVID-19: Secondary | ICD-10-CM | POA: Diagnosis not present

## 2021-06-04 DIAGNOSIS — R55 Syncope and collapse: Secondary | ICD-10-CM | POA: Diagnosis not present

## 2021-06-04 DIAGNOSIS — N390 Urinary tract infection, site not specified: Secondary | ICD-10-CM | POA: Diagnosis not present

## 2021-06-04 LAB — COMPREHENSIVE METABOLIC PANEL
ALT: 19 U/L (ref 0–44)
AST: 19 U/L (ref 15–41)
Albumin: 2.9 g/dL — ABNORMAL LOW (ref 3.5–5.0)
Alkaline Phosphatase: 83 U/L (ref 38–126)
Anion gap: 5 (ref 5–15)
BUN: 11 mg/dL (ref 8–23)
CO2: 30 mmol/L (ref 22–32)
Calcium: 7.9 mg/dL — ABNORMAL LOW (ref 8.9–10.3)
Chloride: 100 mmol/L (ref 98–111)
Creatinine, Ser: 0.32 mg/dL — ABNORMAL LOW (ref 0.44–1.00)
GFR, Estimated: >60 mL/min (ref >60)
Glucose, Bld: 91 mg/dL (ref 70–99)
Potassium: 3.7 mmol/L (ref 3.5–5.1)
Sodium: 135 mmol/L (ref 135–145)
Total Bilirubin: 0.4 mg/dL (ref 0.3–1.2)
Total Protein: 5.9 g/dL — ABNORMAL LOW (ref 6.5–8.1)

## 2021-06-04 LAB — CBC WITH DIFFERENTIAL/PLATELET
Abs Immature Granulocytes: 0.02 10*3/uL (ref 0.00–0.07)
Basophils Absolute: 0 10*3/uL (ref 0.0–0.1)
Basophils Relative: 1 %
Eosinophils Absolute: 0 10*3/uL (ref 0.0–0.5)
Eosinophils Relative: 1 %
HCT: 37 % (ref 36.0–46.0)
Hemoglobin: 11.9 g/dL — ABNORMAL LOW (ref 12.0–15.0)
Immature Granulocytes: 1 %
Lymphocytes Relative: 27 %
Lymphs Abs: 1.2 10*3/uL (ref 0.7–4.0)
MCH: 27.9 pg (ref 26.0–34.0)
MCHC: 32.2 g/dL (ref 30.0–36.0)
MCV: 86.9 fL (ref 80.0–100.0)
Monocytes Absolute: 0.7 10*3/uL (ref 0.1–1.0)
Monocytes Relative: 15 %
Neutro Abs: 2.5 10*3/uL (ref 1.7–7.7)
Neutrophils Relative %: 55 %
Platelets: 182 10*3/uL (ref 150–400)
RBC: 4.26 MIL/uL (ref 3.87–5.11)
RDW: 13.8 % (ref 11.5–15.5)
WBC: 4.4 10*3/uL (ref 4.0–10.5)
nRBC: 0 % (ref 0.0–0.2)

## 2021-06-04 LAB — MAGNESIUM: Magnesium: 1.8 mg/dL (ref 1.7–2.4)

## 2021-06-04 LAB — D-DIMER, QUANTITATIVE: D-Dimer, Quant: 1.57 ug/mL-FEU — ABNORMAL HIGH (ref 0.00–0.50)

## 2021-06-04 LAB — LACTATE DEHYDROGENASE: LDH: 117 U/L (ref 98–192)

## 2021-06-04 LAB — C-REACTIVE PROTEIN: CRP: 1.8 mg/dL — ABNORMAL HIGH (ref <1.0)

## 2021-06-04 LAB — FERRITIN: Ferritin: 135 ng/mL (ref 11–307)

## 2021-06-04 LAB — PHOSPHORUS: Phosphorus: 2.6 mg/dL (ref 2.5–4.6)

## 2021-06-04 NOTE — Progress Notes (Signed)
?PROGRESS NOTE ? ? ? ?Stephanie Donovan  OEV:[id number] DOB: 01-24-1927 DOA: 06/01/2021 ?PCP: Clovia Cuff, MD  ? ? ? ?Brief Narrative:  ?88 y.o. 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  ?  ?Presented with   generalized fatigue weakness and syncopal event ?Presented to ER for cough lives with her daughter who tested positive for COVID today.  Patient at baseline wheelchair-bound status post AKA ?Patient was brought into transient and she noted to have a syncopal episode while sitting in the chair had weak carotid pulse and agonal respirations.  Patient was moved to resuscitation room and rapidly improved without any intervention. ?Patient states that she have not had much to eat today she has had some episodes of syncope while in assisted living.  She has been feeling fairly weak and congested.  Soft stool but no diarrhea.  No falls.  No chest pain or shortness of breath ? She is incontinent of urine and stool at baseline ?Dementia she gets worse at night ?She have had a few syncopal episodes over the past few years when her BP would go down  ?Her Covid symptoms only started yesterday ?She never had a fever ?Family has been trying to get her into assisted living ? ? ?Subjective: ?5/14 afebrile overnight A/O x0.  (Just received Thorazine for intractable nausea).  Patient pleasant follows all commands.  Does remember that she has requested we look into assisted living for her upon discharge. ? ? ?Assessment & Plan: ?Covid vaccination; VACCINATED ?  ?Principal Problem: ?  Syncope ?Active Problems: ?  CAP (community acquired pneumonia) ?  Paroxysmal A-fib ([place]) ?  COVID-19 virus infection ?  Hypothyroidism ?  Chronic diastolic CHF (congestive heart failure) ([place]) ?  Glaucoma ?  Hx of TIA (transient ischemic attack) and stroke ?  Dehydration ?   Hyponatremia ?  Acute lower UTI ?  Above knee amputation of right lower extremity ([place]) ?  Atypical nevus of left lower leg ?  E. coli UTI (urinary tract infection) ? ?  ?  ?Present on Admission: ? Syncope ? Hypothyroidism ? Glaucoma ? Paroxysmal A-fib ([place]) ? Hyponatremia ? Dehydration ? Chronic diastolic CHF (congestive heart failure) ([place]) ? Acute lower UTI ? CAP (community acquired pneumonia) ? COVID-19 virus infection ?  ?  ?  ?Syncope ?-Troponin negative, ?-5/12 echocardiogram pending  ?-Rehydrate and hold lasix ?-Given elevated d.dimer and covid diagnosis ?-5/12 CTA PE protocol negative for PE see results below ?  ?Hypothyroidism/Thyroid Nodule ?-5/11 TSH= 0.67 ?- Synthroid 50 mcg daily ?  ?Glaucoma ?-Stable continue home medications  ? ?Chronic diastolic CHF (congestive heart failure) ([place]) ?-Strict in and out -578.31m ?- Daily weight ?Filed Weights  ? 06/01/21 1228 06/03/21 0500 06/03/21 1007  ?Weight: 63.5 kg 57.5 kg 54.9 kg  ?  ?  ?  ?Paroxysmal A-fib ([place] ?-Now in sinus rhythm not on anticoagulation due to risk of falls ?- PRN Metoprolol 12.5 mg ?-5/14 currently NSR ?  ?Hyponatremia ?- 5/14 resolved ? ?Above knee amputation of right lower extremity ([place] ?-Chronic, family states needs help at home  ?-Will have PT OT eval for needs may need placement ?-See goals of care ?  ?  ?Hx of TIA (transient ischemic attack) and stroke ?-Aspirin listed as allergy although family states they give it to her most of the days ?  ?Dehydration ?-Normal  saline 49m/hr ?  ? ?Acute lower UTI/positive E. coli ?- Complete 5-day course Rocephin ?  ?CAP (community acquired pneumonia) ?-Unclear if true infiltrate or repeat chest x-ray check procalcitonin.  For tonight, antibiotics check Legionella and strep antigen ?If no evidence of typical pneumonia on chest x-ray/CT and normal procalcitonin consider discontinuing azithromycin ?-5/11 Procalcitonin<0.10, discontinue Azithromycin ? ?COVID-19 virus infection ?COVID-19  Labs ? ?Recent Labs  ?  06/01/21 ?1933 06/02/21 ?0240905/13/23 ?0735305/14/23 ?0342  ?DDIMER 2.03* 2.74* 1.63* 1.57*  ?FERRITIN 99  --  116 135  ?LDH 100  --  129 117  ?CRP 1.1* 1.5* 2.7* 1.8*  ? ? ? ?Lab Results  ?Component Value Date  ? SARSCOV2NAA POSITIVE (A) 06/01/2021  ? [place]NEGATIVE 10/10/2020  ? SO[place]aNEGATIVE 10/07/2020  ? [place]GATIVE 08/02/2020  ? ?-Patient not hypoxic, and inflammatory markers trending down ?- Complete course Paxlovid per pharmacy protocol ?-Continuous pulse ox ?- Titrate O2 to maintain SPO2> 92% ? ?Atypical nevus of left lower leg ?-Pt will need t be referred to Dermatology as an outpt ? ?Refractory nausea ?- Zofran 4 mg PRN ?- Thorazine IV 12.5 mg PRN when Zofran ineffective ? ?Goals of care ?- 5/12 TOC consult placed:Requests help in obtaining assisted living upon discharge.  Given her disabilities does not believe she can live on her own. ? ? ?  ?Mobility Assessment (last 72 hours)   ? ? Mobility Assessment   ? ? RGoldfieldName 06/04/21 0735 06/03/21 1100 06/03/21 0821 06/02/21 2000 06/02/21 1041  ? Does patient have an order for bedrest or is patient medically unstable No - Continue assessment -- No - Continue assessment No - Continue assessment --  ? What is the highest level of mobility based on the progressive mobility assessment? Level 2 (Chairfast) - Balance while sitting on edge of bed and cannot stand Level 2 (Chairfast) - Balance while sitting on edge of bed and cannot stand Level 2 (Chairfast) - Balance while sitting on edge of bed and cannot stand Level 2 (Chairfast) - Balance while sitting on edge of bed and cannot stand Level 2 (Chairfast) - Balance while sitting on edge of bed and cannot stand  ? Is the above level different from baseline mobility prior to current illness? No - Consider discontinuing PT/OT -- No - Consider discontinuing PT/OT No - Consider discontinuing PT/OT --  ? ? RTatitlekName 06/02/21 1014 06/02/21 0900 06/01/21 1900  ?  ?  ? Does  patient have an order for bedrest or is patient medically unstable No - Continue assessment -- No - Continue assessment    ? What is the highest level of mobility based on the progressive mobility assessment? Level 2 (Chairfast) - Balance while sitting on edge of bed and cannot stand Level 2 (Chairfast) - Balance while sitting on edge of bed and cannot stand Level 3 (Stands with assist) - Balance while standing  and cannot march in place    ? Is the above level different from baseline mobility prior to current illness? No - Consider discontinuing PT/OT -- No - Consider discontinuing PT/OT    ? ?  ?  ? ?  ? ? ? ?'@IPAL'$ @ ? ?   ?DVT prophylaxis: Lovenox ?Code Status: DNR ?Family Communication:  ?Status is: Inpatient ? ? ? ?Dispo: The patient is from: Home ?             Anticipated d/c is to: ALF ?  Anticipated d/c date is: 3 days ?             Patient currently is not medically stable to d/c. ? ? ? ? ? ?Consultants:  ? ? ?Procedures/Significant Events:  ?5/12 CTA PE protocol ?No evidence of pulmonary embolism or other acute cardiopulmonary ?pathology. ?2. Multiple chronic appearing vertebral body compression fractures ?with no definite acute fracture. ?3. Cholelithiasis without evidence of acute cholecystitis. ?4. Mild bilateral hydronephrosis, incompletely evaluated. Consider ?CT abdomen/pelvis or renal ultrasound as indicated. ?5. Enlarged left thyroid lobe with a 3.7 cm nodule. Thyroid ?ultrasound may be considered as indicated given patient age. ? ?I have personally reviewed and interpreted all radiology studies and my findings are as above. ? ?VENTILATOR SETTINGS: ?Room air 5/14 ?SPO2 96%1 ? ? ?Cultures ?5/11 influenza A/B negative ?5/12 positive SARS coronavirus ? ? ?Antimicrobials: ?Anti-infectives (From admission, onward)  ? ? Start     Ordered Stop  ? 06/02/21 1800  cefTRIAXone (ROCEPHIN) 1 g in sodium chloride 0.9 % 100 mL IVPB       ? 06/01/21 1934 06/07/21 1759  ? 06/01/21 2200   nirmatrelvir/ritonavir EUA (PAXLOVID) 3 tablet  Status:  Discontinued       ? 06/01/21 1605 06/01/21 1814  ? 06/01/21 2200  nirmatrelvir/ritonavir EUA (renal dosing) (PAXLOVID) 2 tablet       ? 06/01/21 1814 06/06/21 2159  ? 06/01/21 2100  azithr

## 2021-06-05 DIAGNOSIS — R55 Syncope and collapse: Secondary | ICD-10-CM | POA: Diagnosis not present

## 2021-06-05 DIAGNOSIS — R531 Weakness: Secondary | ICD-10-CM

## 2021-06-05 DIAGNOSIS — Z515 Encounter for palliative care: Secondary | ICD-10-CM

## 2021-06-05 DIAGNOSIS — Z7189 Other specified counseling: Secondary | ICD-10-CM | POA: Diagnosis not present

## 2021-06-05 DIAGNOSIS — N390 Urinary tract infection, site not specified: Secondary | ICD-10-CM | POA: Diagnosis not present

## 2021-06-05 DIAGNOSIS — U071 COVID-19: Secondary | ICD-10-CM | POA: Diagnosis not present

## 2021-06-05 DIAGNOSIS — S78111A Complete traumatic amputation at level between right hip and knee, initial encounter: Secondary | ICD-10-CM | POA: Diagnosis not present

## 2021-06-05 LAB — COMPREHENSIVE METABOLIC PANEL
ALT: 16 U/L (ref 0–44)
AST: 15 U/L (ref 15–41)
Albumin: 2.5 g/dL — ABNORMAL LOW (ref 3.5–5.0)
Alkaline Phosphatase: 72 U/L (ref 38–126)
Anion gap: 5 (ref 5–15)
BUN: 13 mg/dL (ref 8–23)
CO2: 30 mmol/L (ref 22–32)
Calcium: 7.8 mg/dL — ABNORMAL LOW (ref 8.9–10.3)
Chloride: 102 mmol/L (ref 98–111)
Creatinine, Ser: 0.37 mg/dL — ABNORMAL LOW (ref 0.44–1.00)
GFR, Estimated: >60 mL/min (ref >60)
Glucose, Bld: 89 mg/dL (ref 70–99)
Potassium: 4.1 mmol/L (ref 3.5–5.1)
Sodium: 137 mmol/L (ref 135–145)
Total Bilirubin: 0.3 mg/dL (ref 0.3–1.2)
Total Protein: 5.4 g/dL — ABNORMAL LOW (ref 6.5–8.1)

## 2021-06-05 LAB — CBC WITH DIFFERENTIAL/PLATELET
Abs Immature Granulocytes: 0 10*3/uL (ref 0.00–0.07)
Basophils Absolute: 0 10*3/uL (ref 0.0–0.1)
Basophils Relative: 1 %
Eosinophils Absolute: 0.1 10*3/uL (ref 0.0–0.5)
Eosinophils Relative: 2 %
HCT: 34.1 % — ABNORMAL LOW (ref 36.0–46.0)
Hemoglobin: 11.1 g/dL — ABNORMAL LOW (ref 12.0–15.0)
Immature Granulocytes: 0 %
Lymphocytes Relative: 42 %
Lymphs Abs: 1.4 10*3/uL (ref 0.7–4.0)
MCH: 28.9 pg (ref 26.0–34.0)
MCHC: 32.6 g/dL (ref 30.0–36.0)
MCV: 88.8 fL (ref 80.0–100.0)
Monocytes Absolute: 0.5 10*3/uL (ref 0.1–1.0)
Monocytes Relative: 15 %
Neutro Abs: 1.3 10*3/uL — ABNORMAL LOW (ref 1.7–7.7)
Neutrophils Relative %: 40 %
Platelets: 175 10*3/uL (ref 150–400)
RBC: 3.84 MIL/uL — ABNORMAL LOW (ref 3.87–5.11)
RDW: 14 % (ref 11.5–15.5)
WBC: 3.3 10*3/uL — ABNORMAL LOW (ref 4.0–10.5)
nRBC: 0 % (ref 0.0–0.2)

## 2021-06-05 LAB — D-DIMER, QUANTITATIVE: D-Dimer, Quant: 1.24 ug/mL-FEU — ABNORMAL HIGH (ref 0.00–0.50)

## 2021-06-05 LAB — PHOSPHORUS: Phosphorus: 3.4 mg/dL (ref 2.5–4.6)

## 2021-06-05 LAB — FERRITIN: Ferritin: 111 ng/mL (ref 11–307)

## 2021-06-05 LAB — LEGIONELLA PNEUMOPHILA SEROGP 1 UR AG: L. pneumophila Serogp 1 Ur Ag: NEGATIVE

## 2021-06-05 LAB — LACTATE DEHYDROGENASE: LDH: 100 U/L (ref 98–192)

## 2021-06-05 LAB — C-REACTIVE PROTEIN: CRP: 1.9 mg/dL — ABNORMAL HIGH (ref <1.0)

## 2021-06-05 LAB — MAGNESIUM: Magnesium: 1.9 mg/dL (ref 1.7–2.4)

## 2021-06-05 NOTE — TOC Progression Note (Signed)
Transition of Care (TOC) - Progression Note  ? ? ?Patient Details  ?Name: Stephanie Donovan ?MRN: [id number] ?Date of Birth: 05-Jan-1928 ? ?Transition of Care (TOC) CM/SW Contact  ?Purcell Mouton, RN ?Phone Number: ?06/05/2021, 2:52 PM ? ?Clinical Narrative:    ?A call was made to pt's daughter [place] to follow up on discharge plan. Daughter agreed with pt going to SNF [facility].  Will start FL2.  ? ? ?Expected Discharge Plan: [place] ?Barriers to Discharge: No Barriers Identified ? ?Expected Discharge Plan and Services ?Expected Discharge Plan: [place] ?  ?  ?Post Acute Care Choice: [place] ?Living arrangements for the past 2 months: [place] ?                ?  ?  ?  ?  ?  ?  ?  ?  ?  ?  ? ? ?Social Determinants of Health (SDOH) Interventions ?  ? ?Readmission Risk Interventions ?   ? View : No data to display.  ?  ?  ?  ? ? ?

## 2021-06-05 NOTE — NC FL2 (Signed)
?[place] MEDICAID FL2 LEVEL OF CARE SCREENING TOOL  ?  ? ?IDENTIFICATION  ?Patient Name: ?Stephanie Donovan Birthdate: 11/26/1927 Sex: female Admission Date (Current Location): ?06/01/2021  ?[place] and [place] Number: ? [place] ?  Facility and Address:  ?[facility],  [address], [place] ?     Provider Number: ?[id number]  ?Attending Physician Name and Address:  ?Allie Bossier, MD ? Relative Name and Phone Number:  ?Jermika, Olden Daughter [phone] ?   ?Current Level of Care: ?Hospital Recommended Level of Care: ?[place] Prior Approval Number: ?  ? ?Date Approved/Denied: ?  PASRR Number: ?[id number] ? ?Discharge Plan: ?SNF ?  ? ?Current Diagnoses: ?Patient Active Problem List  ? Diagnosis Date Noted  ? E. coli UTI (urinary tract infection) 06/02/2021  ? CAP (community acquired pneumonia) 06/01/2021  ? COVID-19 virus infection 06/01/2021  ? Atypical nevus of left lower leg 06/01/2021  ? Above knee amputation of right lower extremity ([place]) 10/07/2020  ? Subacute osteomyelitis of right femur ([place])   ? Infected hardware in right leg, sequela   ? Hardware complicating wound infection ([place])   ? Cellulitis 07/27/2020  ? Supracondylar fracture of right femur, closed, initial encounter ([place]) 01/01/2018  ? Closed fracture of right distal femur ([place]) 12/24/2017  ? Acute blood loss anemia 12/19/2017  ? Malnutrition of moderate degree 12/18/2017  ? Femur fracture ([place]) 12/17/2017  ? Fall 12/17/2017  ? HLD (hyperlipidemia) 12/17/2017  ? Chronic anemia 12/17/2017  ? Closed 4-part fracture of proximal humerus, right, initial encounter 10/09/2017  ? Paroxysmal A-fib ([place]) 09/21/2017  ? Closed displaced fracture of right femoral neck ([place]) 09/21/2017  ? Closed fracture of right proximal humerus 09/21/2017  ? Dehydration 09/21/2017  ? Hyponatremia 09/21/2017  ? Elevated CK 09/21/2017  ? Thyroid nodule 09/21/2017  ? Closed right hip fracture ([place]) 09/21/2017  ? Chronic  diastolic CHF (congestive heart failure) ([place]) 09/21/2017  ? Sinus tachycardia 09/21/2017  ? Acute lower UTI 09/21/2017  ? Hx of TIA (transient ischemic attack) and stroke 08/25/2014  ? Aphasia 08/25/2014  ? Chronic constipation 05/19/2014  ? Fracture of multiple pubic rami ([place]) 04/11/2014  ? Pubic bone fracture ([place]) 04/11/2014  ? Left hip pain 04/11/2014  ? Atrial fibrillation with RVR ([place]) 03/28/2013  ? Diarrhea 03/28/2013  ? Hypothyroidism 03/24/2013  ? Syncope 03/24/2013  ? Glaucoma 03/24/2013  ? Hypokalemia 03/24/2013  ? Acute gastroenteritis 03/24/2013  ? ? ?Orientation RESPIRATION BLADDER Height & Weight   ?  ?Self, Place ? Normal Incontinent Weight: 57.8 kg ?Height:  5' (152.4 cm)  ?BEHAVIORAL SYMPTOMS/MOOD NEUROLOGICAL BOWEL NUTRITION STATUS  ?    Incontinent Diet (heart healthy)  ?AMBULATORY STATUS COMMUNICATION OF NEEDS Skin   ?Extensive Assist Verbally Other (Comment) (Ecchymosis Arm, Back, HAnd, Bilateral Sacrum Redness, Right AKA) ?  ?  ?  ?    ?     ?     ? ? ?Personal Care Assistance Level of Assistance  ?Bathing, Feeding, Dressing Bathing Assistance: Maximum assistance ?Feeding assistance: Independent ?Dressing Assistance: Maximum assistance ?   ? ?Functional Limitations Info  ?Sight, Hearing, Speech Sight Info: Impaired (glasses) ?Hearing Info: Adequate ?Speech Info: Adequate  ? ? ?SPECIAL CARE FACTORS FREQUENCY  ?PT (By licensed PT), OT (By licensed OT)   ?  ?PT Frequency: x5 week ?OT Frequency: x5 week ?  ?  ?  ?   ? ? ?Contractures Contractures Info: Not present  ? ? ?Additional Factors Info  ?Code Status, Allergies Code  Status Info: DNR ?Allergies Info: Aspirin, Penicillins, Codeine ?  ?  ?  ?   ? ?Current Medications (06/05/2021):  This is the current hospital active medication list ?Current Facility-Administered Medications  ?Medication Dose Route Frequency Provider Last Rate Last Admin  ? 0.9 %  sodium chloride infusion   Intravenous Continuous Allie Bossier, MD 75 mL/hr at 06/05/21 0511  New Bag at 06/05/21 0511  ? acetaminophen (TYLENOL) tablet 650 mg  650 mg Oral Q6H PRN Toy Baker, MD      ? Or  ? acetaminophen (TYLENOL) suppository 650 mg  650 mg Rectal Q6H PRN Doutova, Anastassia, MD      ? albuterol (VENTOLIN HFA) 108 (90 Base) MCG/ACT inhaler 2 puff  2 puff Inhalation Q6H PRN Doutova, Anastassia, MD      ? brimonidine (ALPHAGAN) 0.15 % ophthalmic solution 1 drop  1 drop Left Eye BID Toy Baker, MD   1 drop at 06/05/21 0942  ? cefTRIAXone (ROCEPHIN) 1 g in sodium chloride 0.9 % 100 mL IVPB  1 g Intravenous Q24H Allie Bossier, MD 200 mL/hr at 06/04/21 1714 1 g at 06/04/21 1714  ? chlorproMAZINE (THORAZINE) 12.5 mg in sodium chloride 0.9 % 25 mL IVPB  12.5 mg Intravenous BID PRN Allie Bossier, MD 50 mL/hr at 06/04/21 1410 12.5 mg at 06/04/21 1410  ? enoxaparin (LOVENOX) injection 40 mg  40 mg Subcutaneous Q24H Toy Baker, MD   40 mg at 06/04/21 2149  ? feeding supplement (ENSURE ENLIVE / ENSURE PLUS) liquid 237 mL  237 mL Oral BID BM Allie Bossier, MD   237 mL at 06/05/21 3419  ? guaiFENesin-dextromethorphan (ROBITUSSIN DM) 100-10 MG/5ML syrup 10 mL  10 mL Oral Q4H PRN Doutova, Anastassia, MD      ? HYDROcodone-acetaminophen (NORCO/VICODIN) 5-325 MG per tablet 1-2 tablet  1-2 tablet Oral Q4H PRN Doutova, Anastassia, MD      ? levothyroxine (SYNTHROID) tablet 50 mcg  50 mcg Oral QAC breakfast Toy Baker, MD   50 mcg at 06/05/21 0512  ? metoprolol succinate (TOPROL-XL) 24 hr tablet 12.5 mg  12.5 mg Oral Daily PRN Toy Baker, MD      ? multivitamin with minerals tablet 1 tablet  1 tablet Oral Daily Allie Bossier, MD   1 tablet at 06/05/21 0941  ? nirmatrelvir/ritonavir EUA (renal dosing) (PAXLOVID) 2 tablet  2 tablet Oral BID Toy Baker, MD   2 tablet at 06/05/21 0941  ? ondansetron (ZOFRAN) tablet 4 mg  4 mg Oral Q6H PRN Toy Baker, MD   4 mg at 06/05/21 1159  ? Or  ? ondansetron (ZOFRAN) injection 4 mg  4 mg Intravenous Q6H  PRN Doutova, Anastassia, MD   4 mg at 06/04/21 1216  ? timolol (TIMOPTIC) 0.25 % ophthalmic solution 1 drop  1 drop Both Eyes BID Toy Baker, MD   1 drop at 06/05/21 0942  ? ? ? ?Discharge Medications: ?Please see discharge summary for a list of discharge medications. ? ?Relevant Imaging Results: ? ?Relevant Lab Results: ? ? ?Additional Information ?[phone] ? ?Favian Kittleson, RN ? ? ? ? ?

## 2021-06-05 NOTE — Plan of Care (Signed)
  Problem: Clinical Measurements: Goal: Ability to maintain a body temperature in the normal range will improve Outcome: Progressing   Problem: Respiratory: Goal: Ability to maintain adequate ventilation will improve Outcome: Progressing Goal: Ability to maintain a clear airway will improve Outcome: Progressing   

## 2021-06-05 NOTE — Progress Notes (Signed)
?PROGRESS NOTE ? ? ? ?Stephanie Donovan  IRC:[id number] DOB: 12-05-1927 DOA: 06/01/2021 ?PCP: Clovia Cuff, MD  ? ? ? ?Brief Narrative:  ?87 y.o. 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  ?  ?Presented with   generalized fatigue weakness and syncopal event ?Presented to ER for cough lives with her daughter who tested positive for COVID today.  Patient at baseline wheelchair-bound status post AKA ?Patient was brought into transient and she noted to have a syncopal episode while sitting in the chair had weak carotid pulse and agonal respirations.  Patient was moved to resuscitation room and rapidly improved without any intervention. ?Patient states that she have not had much to eat today she has had some episodes of syncope while in assisted living.  She has been feeling fairly weak and congested.  Soft stool but no diarrhea.  No falls.  No chest pain or shortness of breath ? She is incontinent of urine and stool at baseline ?Dementia she gets worse at night ?She have had a few syncopal episodes over the past few years when her BP would go down  ?Her Covid symptoms only started yesterday ?She never had a fever ?Family has been trying to get her into assisted living ? ? ?Subjective: ?5/15 afebrile overnight A/1 x0.  (Does not know where, when, why).Patient pleasant follows all commands. ? ? ?Assessment & Plan: ?Covid vaccination; VACCINATED ?  ?Principal Problem: ?  Syncope ?Active Problems: ?  CAP (community acquired pneumonia) ?  Paroxysmal A-fib ([place]) ?  COVID-19 virus infection ?  Hypothyroidism ?  Chronic diastolic CHF (congestive heart failure) ([place]) ?  Glaucoma ?  Hx of TIA (transient ischemic attack) and stroke ?  Dehydration ?  Hyponatremia ?  Acute lower UTI ?  Above knee amputation of right lower extremity ([place]) ?  Atypical nevus of left lower  leg ?  E. coli UTI (urinary tract infection) ? ?  ?  ?Present on Admission: ? Syncope ? Hypothyroidism ? Glaucoma ? Paroxysmal A-fib ([place]) ? Hyponatremia ? Dehydration ? Chronic diastolic CHF (congestive heart failure) ([place]) ? Acute lower UTI ? CAP (community acquired pneumonia) ? COVID-19 virus infection ?  ?  ?  ?Syncope ?-Troponin negative, ?-5/12 echocardiogram pending  ?-Rehydrate and hold lasix ?-Given elevated d.dimer and covid diagnosis ?-5/12 CTA PE protocol negative for PE see results below ?  ?Hypothyroidism/Thyroid Nodule ?-5/11 TSH= 0.67 ?- Synthroid 50 mcg daily ?  ?Glaucoma ?-Stable continue home medications  ? ?Chronic diastolic CHF (congestive heart failure) ([place]) ?-Strict in and out +843.14m ?- Daily weight ?Filed Weights  ? 06/03/21 0500 06/03/21 1007 06/05/21 0500  ?Weight: 57.5 kg 54.9 kg 57.8 kg  ? ?  ?  ?Paroxysmal A-fib ([place] ?-Now in sinus rhythm not on anticoagulation due to risk of falls ?- PRN Metoprolol 12.5 mg ?-5/15 currently NSR ?  ?Hyponatremia ?- 5/14 resolved ? ?Above knee amputation of right lower extremity ([place] ?-Chronic, family states needs help at home  ?-5/12 OT recommends SNF ?- 5/13 PT recommends SNF ?-See goals of care ?  ?  ?Hx of TIA (transient ischemic attack) and stroke ?-Aspirin listed as allergy although family states they give it to her most of the days ?  ?Dehydration ?-Normal saline 733mhr ?  ? ?Acute lower UTI/positive E. coli ?- Complete 5-day course Rocephin ?  ?CAP (community acquired  pneumonia) ?-Unclear if true infiltrate or repeat chest x-ray check procalcitonin.  For tonight, antibiotics check Legionella and strep antigen ?If no evidence of typical pneumonia on chest x-ray/CT and normal procalcitonin consider discontinuing azithromycin ?-5/11 Procalcitonin<0.10, discontinue Azithromycin ? ?COVID-19 virus infection ?COVID-19 Labs ? ?Recent Labs  ?  06/03/21 ?0416 06/04/21 ?0342 06/05/21 ?3846  ?DDIMER 1.63* 1.57* 1.24*  ?FERRITIN 116 135 111  ?LDH 129 117  100  ?CRP 2.7* 1.8* 1.9*  ? ? ? ?Lab Results  ?Component Value Date  ? SARSCOV2NAA POSITIVE (A) 06/01/2021  ? [place] NEGATIVE 10/10/2020  ? [place] NEGATIVE 10/07/2020  ? [place] NEGATIVE 08/02/2020  ? ?-Patient not hypoxic, and inflammatory markers trending down ?- Complete course Paxlovid per pharmacy protocol ?-Continuous pulse ox ?- Titrate O2 to maintain SPO2> 92% ? ?Atypical nevus of left lower leg ?-Pt will need referral to Dermatology as an outpt ? ?Refractory nausea ?- Zofran 4 mg PRN ?- Thorazine IV 12.5 mg PRN when Zofran ineffective ? ?Goals of care ?- 5/12 TOC consult placed:Requests help in obtaining assisted living upon discharge.  Given her disabilities does not believe she can live on her own. ? ? ?  ?Mobility Assessment (last 72 hours)   ? ? Mobility Assessment   ? ? [place] Name 06/04/21 2150 06/04/21 0735 06/03/21 1100 06/03/21 0821 06/02/21 2000  ? Does patient have an order for bedrest or is patient medically unstable No - Continue assessment No - Continue assessment -- No - Continue assessment No - Continue assessment  ? What is the highest level of mobility based on the progressive mobility assessment? Level 2 (Chair[place]) - Balance while sitting on edge of bed and cannot stand Level 2 (Chairfast) - Balance while sitting on edge of bed and cannot stand Level 2 (Chairfast) - Balance while sitting on edge of bed and cannot stand Level 2 (Chairfast) - Balance while sitting on edge of bed and cannot stand Level 2 (Chairfast) - Balance while sitting on edge of bed and cannot stand  ? Is the above level different from baseline mobility prior to current illness? -- No - Consider discontinuing PT/OT -- No - Consider discontinuing PT/OT No - Consider discontinuing PT/OT  ? ?  ?  ? ?  ? ? ? ?'@IPAL'$ @ ? ?   ?DVT prophylaxis: Lovenox ?Code Status: DNR ?Family Communication:  ?Status is: Inpatient ? ? ? ?Dispo: The patient is from: Home ?             Anticipated d/c is to: ALF ?              Anticipated d/c date is: 3 days ?             Patient currently patient is medically stable ? ? ? ? ? ?Consultants:  ? ? ?Procedures/Significant Events:  ?5/12 CTA PE protocol ?No evidence of pulmonary embolism or other acute cardiopulmonary ?pathology. ?2. Multiple chronic appearing vertebral body compression fractures ?with no definite acute fracture. ?3. Cholelithiasis without evidence of acute cholecystitis. ?4. Mild bilateral hydronephrosis, incompletely evaluated. Consider ?CT abdomen/pelvis or renal ultrasound as indicated. ?5. Enlarged left thyroid lobe with a 3.7 cm nodule. Thyroid ?ultrasound may be considered as indicated given patient age. ? ?I have personally reviewed and interpreted all radiology studies and my findings are as above. ? ?VENTILATOR SETTINGS: ?Room air 5/15 ?SPO2 96% ? ? ?Cultures ?5/11 influenza A/B negative ?5/12 positive SARS coronavirus ? ? ?Antimicrobials: ?Anti-infectives (From admission, onward)  ? ? Start  Ordered Stop  ? 06/02/21 1800  cefTRIAXone (ROCEPHIN) 1 g in sodium chloride 0.9 % 100 mL IVPB       ? 06/01/21 1934 06/07/21 1759  ? 06/01/21 2200  nirmatrelvir/ritonavir EUA (PAXLOVID) 3 tablet  Status:  Discontinued       ? 06/01/21 1605 06/01/21 1814  ? 06/01/21 2200  nirmatrelvir/ritonavir EUA (renal dosing) (PAXLOVID) 2 tablet       ? 06/01/21 1814 06/06/21 2159  ? 06/01/21 2100  azithromycin (ZITHROMAX) 500 mg in sodium chloride 0.9 % 250 mL IVPB  Status:  Discontinued       ? 06/01/21 1934 06/01/21 2131  ? 06/01/21 1615  cefTRIAXone (ROCEPHIN) 1 g in sodium chloride 0.9 % 100 mL IVPB       ? 06/01/21 1606 06/01/21 1900  ? ?  ?  ? ?Devices ?  ? ?LINES / TUBES:  ? ? ? ? ?Continuous Infusions: ? sodium chloride 75 mL/hr at 06/05/21 0511  ? cefTRIAXone (ROCEPHIN)  IV 1 g (06/04/21 1714)  ? chlorproMAZINE (THORAZINE) IV 12.5 mg (06/04/21 1410)  ? ? ? ?Objective: ?Vitals:  ? 06/04/21 2152 06/05/21 0500 06/05/21 0646 06/05/21 1354  ?BP: 110/63  114/69 119/70  ?Pulse: 93  80  86  ?Resp: '17  16 16  '$ ?Temp: 98.1 ?F (36.7 ?C)  97.8 ?F (36.6 ?C) 97.9 ?F (36.6 ?C)  ?TempSrc: Oral  Oral Oral  ?SpO2: 95%  96% 97%  ?Weight:  57.8 kg    ?Height:      ? ? ?Intake/Output Summary (Last 24 hou

## 2021-06-05 NOTE — Progress Notes (Signed)
? ?                                                                                                                                                     ?                                                   ?Daily Progress Note  ? ?Patient Name: Stephanie Donovan       Date: 06/05/2021 ?DOB: [birth year]  Age: 88 y.o. MRN#: [id number] ?Attending Physician: Allie Bossier, MD ?Primary Care Physician: Clovia Cuff, MD ?Admit Date: 06/01/2021 ? ?Reason for Consultation/Follow-up: Establishing goals of care ? ?Subjective: ?Awake alert resting in bed, no distress, call placed and reviewed goals of care with daughter [place], see below.  ? ?Length of Stay: 3 ? ?Current Medications: ?Scheduled Meds:  ? brimonidine  1 drop Left Eye BID  ? enoxaparin (LOVENOX) injection  40 mg Subcutaneous Q24H  ? feeding supplement  237 mL Oral BID BM  ? levothyroxine  50 mcg Oral QAC breakfast  ? multivitamin with minerals  1 tablet Oral Daily  ? nirmatrelvir/ritonavir EUA (renal dosing)  2 tablet Oral BID  ? timolol  1 drop Both Eyes BID  ? ? ?Continuous Infusions: ? sodium chloride 75 mL/hr at 06/05/21 0511  ? cefTRIAXone (ROCEPHIN)  IV 1 g (06/04/21 1714)  ? chlorproMAZINE (THORAZINE) IV 12.5 mg (06/04/21 1410)  ? ? ?PRN Meds: ?acetaminophen **OR** acetaminophen, albuterol, chlorproMAZINE (THORAZINE) IV, guaiFENesin-dextromethorphan, HYDROcodone-acetaminophen, metoprolol succinate, ondansetron **OR** ondansetron (ZOFRAN) IV ? ?Physical Exam         ?Awake alert ?Tracks in the room ?Regular work of breathing ?S 1 S 2  ?R AKA ?No distress ? ?Vital Signs: BP 119/70 (BP Location: Left Arm)   Pulse 86   Temp 97.9 ?F (36.6 ?C) (Oral)   Resp 16   Ht 5' (1.524 m)   Wt 57.8 kg   SpO2 97%   BMI 24.89 kg/m?  ?SpO2: SpO2: 97 % ?O2 Device: O2 Device: Room Air ?O2 Flow Rate:   ? ?Intake/output summary:  ?Intake/Output Summary (Last 24 hours) at 06/05/2021 1359 ?Last data filed at 06/05/2021 1300 ?Gross per 24 hour  ?Intake 1205.02 ml  ?Output 1500  ml  ?Net -294.98 ml  ? ?LBM: Last BM Date : 06/04/21 ?Baseline Weight: Weight: 63.5 kg ?Most recent weight: Weight: 57.8 kg ? ?     ?Palliative Assessment/Data: ? ? ? ? ? ?Patient Active Problem List  ? Diagnosis Date Noted  ? E. coli UTI (urinary tract infection) 06/02/2021  ? CAP (community acquired pneumonia) 06/01/2021  ? COVID-19 virus infection 06/01/2021  ? Atypical nevus of left lower leg  06/01/2021  ? Above knee amputation of right lower extremity ([place]) 10/07/2020  ? Subacute osteomyelitis of right femur ([place])   ? Infected hardware in right leg, sequela   ? Hardware complicating wound infection ([place])   ? Cellulitis 07/27/2020  ? Supracondylar fracture of right femur, closed, initial encounter ([place]) 01/01/2018  ? Closed fracture of right distal femur ([place]) 12/24/2017  ? Acute blood loss anemia 12/19/2017  ? Malnutrition of moderate degree 12/18/2017  ? Femur fracture ([place]) 12/17/2017  ? Fall 12/17/2017  ? HLD (hyperlipidemia) 12/17/2017  ? Chronic anemia 12/17/2017  ? Closed 4-part fracture of proximal humerus, right, initial encounter 10/09/2017  ? Paroxysmal A-fib ([place]) 09/21/2017  ? Closed displaced fracture of right femoral neck ([place]) 09/21/2017  ? Closed fracture of right proximal humerus 09/21/2017  ? Dehydration 09/21/2017  ? Hyponatremia 09/21/2017  ? Elevated CK 09/21/2017  ? Thyroid nodule 09/21/2017  ? Closed right hip fracture ([place]) 09/21/2017  ? Chronic diastolic CHF (congestive heart failure) ([place]) 09/21/2017  ? Sinus tachycardia 09/21/2017  ? Acute lower UTI 09/21/2017  ? Hx of TIA (transient ischemic attack) and stroke 08/25/2014  ? Aphasia 08/25/2014  ? Chronic constipation 05/19/2014  ? Fracture of multiple pubic rami ([place]) 04/11/2014  ? Pubic bone fracture ([place]) 04/11/2014  ? Left hip pain 04/11/2014  ? Atrial fibrillation with RVR ([place]) 03/28/2013  ? Diarrhea 03/28/2013  ? Hypothyroidism 03/24/2013  ? Syncope 03/24/2013  ? Glaucoma 03/24/2013  ? Hypokalemia 03/24/2013  ? Acute  gastroenteritis 03/24/2013  ? ? ?Palliative Care Assessment & Plan  ? ?Patient Profile: ?  ? ?Assessment: ? 88 y.o. 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 Presented with generalized fatigue weakness and syncopal event, patient at baseline wheelchair-bound status post AKA. She is admitted with CAP, Paroxysmal A fib, chronic diastolic CHF.  ?  ? ?Recommendations/Plan: ? PMT following for goals of care discussions. Call placed and discussed with daughter [place]. She hopes the patient will be able to get into [facility] on [address] in [place]. She is accepting of palliative care follow up at patient's facility.  ?DNR ?Continue current mode of care.  ?  ? ?Code Status: ? ?  ?Code Status Orders  ?(From admission, onward)  ?  ? ? ?  ? ?  Start     Ordered  ? 06/01/21 1932  Do not attempt resuscitation (DNR)  Continuous       ?Question Answer Comment  ?In the event of cardiac or respiratory ARREST Do not call a ?code blue?   ?In the event of cardiac or respiratory ARREST Do not perform Intubation, CPR, defibrillation or ACLS   ?In the event of cardiac or respiratory ARREST Use medication by any route, position, wound care, and other measures to relive pain and suffering. May use oxygen, suction and manual treatment of airway obstruction as needed for comfort.   ?  ? 06/01/21 1934  ? ?  ?  ? ?  ? ?Code Status History   ? ? Date Active Date Inactive Code Status Order ID Comments User Context  ? 10/07/2020 1556 10/11/2020 0201 Full Code [id number]  Persons, Bevely Palmer, [place] Inpatient  ? 07/29/2020 1144 08/04/2020 2215 DNR [id number]  Caren Griffins, MD Inpatient  ? 07/27/2020 2117 07/29/2020 1144 Full Code [id number]  Kayleen Memos, DO ED  ? 07/27/2020  2110 07/27/2020 2116 Full Code [id number]  Kayleen Memos, DO ED  ? 07/22/2020 1532 07/25/2020 2201 DNR [id number]   Thurnell Lose, MD ED  ? 11/28/2019 1604 11/30/2019 2342 Full Code [id number]  Lequita Halt, MD ED  ? 12/17/2017 0629 12/24/2017 1837 Full Code [id number]  Shela Leff, MD Inpatient  ? 10/09/2017 1515 10/10/2017 1958 Full Code [id number]  [name], MD Inpatient  ? 09/21/2017 2022 09/25/2017 0156 Full Code [id number]  Toy Baker, MD ED  ? 08/25/2014 0951 08/26/2014 1917 Full Code [id number]  Florencia Reasons, MD Inpatient  ? 04/11/2014 2041 04/14/2014 1850 Full Code [id number]  Theressa Millard, MD Inpatient  ? 03/28/2013 1441 03/29/2013 1948 Full Code [id number]  Orson Eva, MD Inpatient  ? 03/25/2013 0025 03/27/2013 1759 Full Code [id number]  Louellen Molder, MD Inpatient  ? ?  ? ?Advance Directive Documentation   ? ?Flowsheet Row Most Recent Value  ?Type of Advance Directive Living will  ?Pre-existing out of facility DNR order (yellow form or pink MOST form) --  ?"MOST" Form in Place? --  ? ?  ? ? ?Prognosis: ? Unable to determine ? ?Discharge Planning: ?Likely for ALF with palliative.  ? ?Care plan was discussed with daughter [place] on the phone. ? ?Thank you for allowing the Palliative Medicine Team to assist in the care of this patient. ? ? ?Time In: 1300 Time Out: 13.25 Total Time 25 Prolonged Time Billed  no   ? ?   ?Greater than 50%  of this time was spent counseling and coordinating care related to the above assessment and plan. ? ?Loistine Chance, MD ? ?Please contact Palliative Medicine Team phone at [phone] for questions and concerns.  ? ? ? ? ? ?

## 2021-06-06 DIAGNOSIS — N390 Urinary tract infection, site not specified: Secondary | ICD-10-CM | POA: Diagnosis not present

## 2021-06-06 DIAGNOSIS — S78111A Complete traumatic amputation at level between right hip and knee, initial encounter: Secondary | ICD-10-CM | POA: Diagnosis not present

## 2021-06-06 DIAGNOSIS — U071 COVID-19: Secondary | ICD-10-CM | POA: Diagnosis not present

## 2021-06-06 DIAGNOSIS — R55 Syncope and collapse: Secondary | ICD-10-CM | POA: Diagnosis not present

## 2021-06-06 LAB — CBC WITH DIFFERENTIAL/PLATELET
Abs Immature Granulocytes: 0.01 10*3/uL (ref 0.00–0.07)
Basophils Absolute: 0 10*3/uL (ref 0.0–0.1)
Basophils Relative: 1 %
Eosinophils Absolute: 0.1 10*3/uL (ref 0.0–0.5)
Eosinophils Relative: 2 %
HCT: 34.1 % — ABNORMAL LOW (ref 36.0–46.0)
Hemoglobin: 11 g/dL — ABNORMAL LOW (ref 12.0–15.0)
Immature Granulocytes: 0 %
Lymphocytes Relative: 31 %
Lymphs Abs: 1.5 10*3/uL (ref 0.7–4.0)
MCH: 28.5 pg (ref 26.0–34.0)
MCHC: 32.3 g/dL (ref 30.0–36.0)
MCV: 88.3 fL (ref 80.0–100.0)
Monocytes Absolute: 0.6 10*3/uL (ref 0.1–1.0)
Monocytes Relative: 12 %
Neutro Abs: 2.6 10*3/uL (ref 1.7–7.7)
Neutrophils Relative %: 54 %
Platelets: 181 10*3/uL (ref 150–400)
RBC: 3.86 MIL/uL — ABNORMAL LOW (ref 3.87–5.11)
RDW: 13.9 % (ref 11.5–15.5)
WBC: 4.7 10*3/uL (ref 4.0–10.5)
nRBC: 0 % (ref 0.0–0.2)

## 2021-06-06 LAB — FERRITIN: Ferritin: 181 ng/mL (ref 11–307)

## 2021-06-06 LAB — COMPREHENSIVE METABOLIC PANEL
ALT: 15 U/L (ref 0–44)
AST: 15 U/L (ref 15–41)
Albumin: 2.6 g/dL — ABNORMAL LOW (ref 3.5–5.0)
Alkaline Phosphatase: 73 U/L (ref 38–126)
Anion gap: 5 (ref 5–15)
BUN: 9 mg/dL (ref 8–23)
CO2: 28 mmol/L (ref 22–32)
Calcium: 7.9 mg/dL — ABNORMAL LOW (ref 8.9–10.3)
Chloride: 102 mmol/L (ref 98–111)
Creatinine, Ser: 0.32 mg/dL — ABNORMAL LOW (ref 0.44–1.00)
GFR, Estimated: >60 mL/min (ref >60)
Glucose, Bld: 91 mg/dL (ref 70–99)
Potassium: 3.9 mmol/L (ref 3.5–5.1)
Sodium: 135 mmol/L (ref 135–145)
Total Bilirubin: 0.5 mg/dL (ref 0.3–1.2)
Total Protein: 5.6 g/dL — ABNORMAL LOW (ref 6.5–8.1)

## 2021-06-06 LAB — PHOSPHORUS: Phosphorus: 2.4 mg/dL — ABNORMAL LOW (ref 2.5–4.6)

## 2021-06-06 LAB — CULTURE, BLOOD (ROUTINE X 2)
Culture: NO GROWTH
Culture: NO GROWTH
Special Requests: ADEQUATE

## 2021-06-06 LAB — LACTATE DEHYDROGENASE: LDH: 108 U/L (ref 98–192)

## 2021-06-06 LAB — MAGNESIUM: Magnesium: 2 mg/dL (ref 1.7–2.4)

## 2021-06-06 LAB — D-DIMER, QUANTITATIVE: D-Dimer, Quant: 1.28 ug/mL-FEU — ABNORMAL HIGH (ref 0.00–0.50)

## 2021-06-06 LAB — C-REACTIVE PROTEIN: CRP: 1.2 mg/dL — ABNORMAL HIGH (ref <1.0)

## 2021-06-06 LAB — OSMOLALITY, URINE: Osmolality, Ur: 286 mOsm/kg — ABNORMAL LOW (ref 300–900)

## 2021-06-06 LAB — SODIUM, URINE, RANDOM: Sodium, Ur: 114 mmol/L

## 2021-06-06 NOTE — Progress Notes (Signed)
Occupational Therapy Treatment ?Patient Details ?Name: Stephanie Donovan ?MRN: [id number] ?DOB: 1927-05-23 ?Today's Date: 06/06/2021 ? ? ?History of present illness Stephanie Donovan is a 88 y.o. 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 Presented with generalized fatigue weakness and syncopal event  Found to be COVID positive. ?  ?OT comments ? Patient was noted to be confused with orientation of time during session with multiple attempts to orient patient. Nurse made aware. Patient was able to engage in grooming tasks sitting EOB with min guard and cues to attend to task. Patient would continue to benefit from skilled OT services at this time while admitted and after d/c to address noted deficits in order to improve overall safety and independence in ADLs.  ?  ? ?Recommendations for follow up therapy are one component of a multi-disciplinary discharge planning process, led by the attending physician.  Recommendations may be updated based on patient status, additional functional criteria and insurance authorization. ?   ?Follow Up Recommendations ? Skilled nursing-short term rehab (<3 hours/day)  ?  ?Assistance Recommended at Discharge Frequent or constant Supervision/Assistance  ?Patient can return home with the following ? Two people to help with walking and/or transfers;A lot of help with bathing/dressing/bathroom;Assistance with cooking/housework;Direct supervision/assist for financial management;Direct supervision/assist for medications management;Help with stairs or ramp for entrance;Assist for transportation ?  ?Equipment Recommendations ? None recommended by OT  ?  ?Recommendations for Other Services   ? ?  ?Precautions / Restrictions Precautions ?Precautions:  Fall ?Restrictions ?Weight Bearing Restrictions: No  ? ? ?  ? ?Mobility Bed Mobility ?Overal bed mobility: Needs Assistance ?  ?  ?  ?Supine to sit: HOB elevated, Min assist ?  ?  ?General bed mobility comments: with increased time. patient decliend to attempt to get out of bed on this date. patient was max A for repositioning in bed. ?  ? ?Transfers ?  ?  ?  ?  ?  ?  ?  ?  ?  ?  ?  ?  ?Balance Overall balance assessment: Needs assistance ?Sitting-balance support: No upper extremity supported, Feet supported ?Sitting balance-Leahy Scale: Fair ?  ?  ?  ?  ?  ?  ?  ?  ?  ?  ?  ?  ?  ?  ?  ?  ?   ? ?ADL either performed or assessed with clinical judgement  ? ?ADL Overall ADL's : Needs assistance/impaired ?  ?  ?Grooming: Sitting;Minimal assistance;Oral care ?Grooming Details (indicate cue type and reason): sitting on edge of bed with increased time. patient was noted to need cues for safety and to stay oriented to task. ?  ?  ?  ?  ?  ?  ?  ?  ?  ?  ?  ?  ?  ?  ?  ?General ADL Comments: patient was able to endage in sititng on edge of bed for about 8 mins with good sitting balance during grooming tasks. patient was able to maintain O2 saturation on RA during task as well 96% or higher. patient was confused durign session but able to follow one step commands. ?  ? ?Extremity/Trunk Assessment   ?  ?  ?  ?  ?  ? ?Vision   ?  ?  ?Perception   ?  ?  Praxis   ?  ? ?Cognition Arousal/Alertness: Awake/alert ?Behavior During Therapy: [facility] for tasks assessed/performed ?Overall Cognitive Status: History of cognitive impairments - at baseline ?  ?  ?  ?  ?  ?  ?  ?  ?  ?  ?  ?  ?  ?  ?  ?  ?General Comments: patient inquired about dinner multiple times during session. pattempted to reorient patient to it being 3 pm but unable to make it last past about 1 min to 1.5 mins. ?  ?  ?   ?Exercises   ? ?  ?Shoulder Instructions   ? ? ?  ?General Comments    ? ? ?Pertinent Vitals/ Pain       Pain Assessment ?Pain Assessment: No/denies  pain ? ?Home Living   ?  ?  ?  ?  ?  ?  ?  ?  ?  ?  ?  ?  ?  ?  ?  ?  ?  ?  ? ?  ?Prior Functioning/Environment    ?  ?  ?  ?   ? ?Frequency ? Min 2X/week  ? ? ? ? ?  ?Progress Toward Goals ? ?OT Goals(current goals can now be found in the care plan section) ? Progress towards OT goals: Progressing toward goals ? ?   ?Plan Discharge plan remains appropriate   ? ?Co-evaluation ? ? ?   ?  ?  ?  ?  ? ?  ?AM-PAC OT "6 Clicks" Daily Activity     ?Outcome Measure ? ? Help from another person eating meals?: A Little ?Help from another person taking care of personal grooming?: A Little ?Help from another person toileting, which includes using toliet, bedpan, or urinal?: Total ?Help from another person bathing (including washing, rinsing, drying)?: A Lot ?Help from another person to put on and taking off regular upper body clothing?: A Little ?Help from another person to put on and taking off regular lower body clothing?: A Lot ?6 Click Score: 14 ? ?  ?End of Session   ? ?OT Visit Diagnosis: Muscle weakness (generalized) (M62.81) ?  ?Activity Tolerance Patient tolerated treatment well ?  ?Patient Left in bed;with call bell/phone within reach;with bed alarm set ?  ?Nurse Communication Mobility status ?  ? ?   ? ?Time: 2423-5361 ?OT Time Calculation (min): 24 min ? ?Charges: OT General Charges ?$OT Visit: 1 Visit ?OT Treatments ?$Self Care/Home Management : 23-37 mins ? ?Stephanie Donovan OTR/L, MS ?Acute Rehabilitation Department ?Office# [phone] ?Pager# [phone] ? ? ?[place] ?06/06/2021, 3:45 PM ?

## 2021-06-06 NOTE — Plan of Care (Signed)
?  Problem: Respiratory: ?Goal: Ability to maintain a clear airway will improve ?Outcome: Progressing ?  ?Problem: Respiratory: ?Goal: Will maintain a patent airway ?Outcome: Progressing ?Goal: Complications related to the disease process, condition or treatment will be avoided or minimized ?Outcome: Progressing ?  ?

## 2021-06-06 NOTE — Progress Notes (Signed)
PT Cancellation Note ? ?Patient Details ?Name: Stephanie Donovan ?MRN: [id number] ?DOB: [birth year] ? ? ?Cancelled Treatment:    Reason Eval/Treat Not Completed: Other (comment).  Complaining of nausea and fatigue, declining PT this AM.  Retry at another time. ? ? ?Ramond Dial ?06/06/2021, 12:22 PM ? ?Mee Hives, PT PhD ?Acute Rehab Dept. Number: [facility] [phone] and [place] [phone] ? ?

## 2021-06-06 NOTE — Plan of Care (Signed)
?  Problem: Activity: ?Goal: Ability to tolerate increased activity will improve ?06/06/2021 1709 by Zadie Rhine, RN ?Outcome: Progressing ?06/06/2021 1448 by Zadie Rhine, RN ?Outcome: Progressing ?  ?Problem: Respiratory: ?Goal: Ability to maintain a clear airway will improve ?06/06/2021 1709 by Zadie Rhine, RN ?Outcome: Progressing ?06/06/2021 1448 by Zadie Rhine, RN ?Outcome: Progressing ?  ?

## 2021-06-06 NOTE — Progress Notes (Signed)
?PROGRESS NOTE ? ? ? ?Stephanie Donovan  ZTI:[id number] DOB: 1927/03/17 DOA: 06/01/2021 ?PCP: Clovia Cuff, MD  ? ? ? ?Brief Narrative:  ?88 y.o. 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  ?  ?Presented with   generalized fatigue weakness and syncopal event ?Presented to ER for cough lives with her daughter who tested positive for COVID today.  Patient at baseline wheelchair-bound status post AKA ?Patient was brought into transient and she noted to have a syncopal episode while sitting in the chair had weak carotid pulse and agonal respirations.  Patient was moved to resuscitation room and rapidly improved without any intervention. ?Patient states that she have not had much to eat today she has had some episodes of syncope while in assisted living.  She has been feeling fairly weak and congested.  Soft stool but no diarrhea.  No falls.  No chest pain or shortness of breath ? She is incontinent of urine and stool at baseline ?Dementia she gets worse at night ?She have had a few syncopal episodes over the past few years when her BP would go down  ?Her Covid symptoms only started yesterday ?She never had a fever ?Family has been trying to get her into assisted living ? ? ?Subjective: ?5/16 afebrile overnight A/O x1 (does not know where, when, why).  Patient does know that we are attempting to find ALS/SNF facility for her. ? ? ?Assessment & Plan: ?Covid vaccination; VACCINATED ?  ?Principal Problem: ?  Syncope ?Active Problems: ?  CAP (community acquired pneumonia) ?  Paroxysmal A-fib ([place]) ?  COVID-19 virus infection ?  Hypothyroidism ?  Chronic diastolic CHF (congestive heart failure) ([place]) ?  Glaucoma ?  Hx of TIA (transient ischemic attack) and stroke ?  Dehydration ?  Hyponatremia ?  Acute lower UTI ?  Above knee amputation of right lower extremity  ([place]) ?  Atypical nevus of left lower leg ?  E. coli UTI (urinary tract infection) ? ?  ?  ?Present on Admission: ? Syncope ? Hypothyroidism ? Glaucoma ? Paroxysmal A-fib ([place]) ? Hyponatremia ? Dehydration ? Chronic diastolic CHF (congestive heart failure) ([place]) ? Acute lower UTI ? CAP (community acquired pneumonia) ? COVID-19 virus infection ?  ?  ?  ?Syncope ?-Troponin negative, ?-5/12 echocardiogram pending  ?-Rehydrate and hold lasix ?-Given elevated d.dimer and covid diagnosis ?-5/12 CTA PE protocol negative for PE see results below ?  ?Hypothyroidism/Thyroid Nodule ?-5/11 TSH= 0.67 ?- Synthroid 50 mcg daily ?  ?Glaucoma ?-Stable continue home medications  ? ?Chronic diastolic CHF (congestive heart failure) ([place]) ?-Strict in and out -1.9 L ?- Daily weight ?Filed Weights  ? 06/03/21 1007 06/05/21 0500 06/06/21 0522  ?Weight: 54.9 kg 57.8 kg 56.1 kg  ? ?  ?Paroxysmal A-fib ([place]) ?-Now in sinus rhythm not on anticoagulation due to risk of falls ?- PRN Metoprolol 12.5 mg ?-5/15 currently NSR ?  ?Hyponatremia ?- 5/14 resolved ? ?Above knee amputation of right lower extremity ([place]) ?-Chronic, family states needs help at home  ?-5/12 OT recommends SNF ?- 5/13 PT recommends SNF ?-See goals of care ?  ?  ?Hx of TIA (transient ischemic attack) and stroke ?-Aspirin listed as allergy although family states they give it to her most of the days ?  ?Dehydration ?-Normal saline 56m/hr ? ?Acute lower UTI/positive E. coli ?- Complete 5-day course  Rocephin ?  ?CAP (community acquired pneumonia) ?-Unclear if true infiltrate or repeat chest x-ray check procalcitonin.  For tonight, antibiotics check Legionella and strep antigen ?If no evidence of typical pneumonia on chest x-ray/CT and normal procalcitonin consider discontinuing azithromycin ?-5/11 Procalcitonin<0.10, discontinue Azithromycin ? ?COVID-19 virus infection ?COVID-19 Labs ? ?Recent Labs  ?  06/04/21 ?0342 06/05/21 ?3009 06/06/21 ?0402  ?DDIMER 1.57* 1.24* 1.28*   ?FERRITIN 135 111 181  ?LDH 117 100 108  ?CRP 1.8* 1.9* 1.2*  ? ? ? ?Lab Results  ?Component Value Date  ? SARSCOV2NAA POSITIVE (A) 06/01/2021  ? [place] NEGATIVE 10/10/2020  ? [place] NEGATIVE 10/07/2020  ? [place] NEGATIVE 08/02/2020  ? ?-Patient not hypoxic, and inflammatory markers trending down ?- Complete course Paxlovid per pharmacy protocol ?-Continuous pulse ox ?- Titrate O2 to maintain SPO2> 92% ? ?Atypical nevus of left lower leg ?-Pt will need referral to Dermatology as an outpt ? ?Refractory nausea ?- Zofran 4 mg PRN ?- Thorazine IV 12.5 mg PRN when Zofran ineffective ? ?Goals of care ?- 5/12 TOC consult placed:Requests help in obtaining assisted living upon discharge.  Given her disabilities does not believe she can live on her own. ?-5/16 patient #3 bed offers, not available until 5/21.  See TOC note ? ? ?  ?Mobility Assessment (last 72 hours)   ? ? Mobility Assessment   ? ? [place] Name 06/04/21 2150 06/04/21 0735  ?  ?  ?  ? Does patient have an order for bedrest or is patient medically unstable No - Continue assessment No - Continue assessment     ? What is the highest level of mobility based on the progressive mobility assessment? Level 2 ([place]) - Balance while sitting on edge of bed and cannot stand Level 2 ([place]) - Balance while sitting on edge of bed and cannot stand     ? Is the above level different from baseline mobility prior to current illness? -- No - Consider discontinuing PT/OT     ? ?  ?  ? ?  ? ? ? ?'@IPAL'$ @ ? ?   ?DVT prophylaxis: Lovenox ?Code Status: DNR ?Family Communication:  ?Status is: Inpatient ? ? ? ?Dispo: The patient is from: Home ?             Anticipated d/c is to: ALF ?             Anticipated d/c date is: 3 days ?             Patient currently patient is medically stable ? ? ? ? ? ?Consultants:  ? ? ?Procedures/Significant Events:  ?5/12 CTA PE protocol ?No evidence of pulmonary embolism or other acute cardiopulmonary ?pathology. ?2. Multiple chronic  appearing vertebral body compression fractures ?with no definite acute fracture. ?3. Cholelithiasis without evidence of acute cholecystitis. ?4. Mild bilateral hydronephrosis, incompletely evaluated. Consider ?CT abdomen/pelvis or renal ultrasound as indicated. ?5. Enlarged left thyroid lobe with a 3.7 cm nodule. Thyroid ?ultrasound may be considered as indicated given patient age. ? ?I have personally reviewed and interpreted all radiology studies and my findings are as above. ? ?VENTILATOR SETTINGS: ?Room air 5/15 ?SPO2 96% ? ? ?Cultures ?5/11 influenza A/B negative ?5/12 positive SARS coronavirus ? ? ?Antimicrobials: ?Anti-infectives (From admission, onward)  ? ? Start     Ordered Stop  ? 06/02/21 1800  cefTRIAXone (ROCEPHIN) 1 g in sodium chloride 0.9 % 100 mL IVPB       ? 06/01/21 1934 06/07/21 1759  ? 06/01/21  2200  nirmatrelvir/ritonavir EUA (PAXLOVID) 3 tablet  Status:  Discontinued       ? 06/01/21 1605 06/01/21 1814  ? 06/01/21 2200  nirmatrelvir/ritonavir EUA (renal dosing) (PAXLOVID) 2 tablet       ? 06/01/21 1814 06/06/21 2159  ? 06/01/21 2100  azithromycin (ZITHROMAX) 500 mg in sodium chloride 0.9 % 250 mL IVPB  Status:  Discontinued       ? 06/01/21 1934 06/01/21 2131  ? 06/01/21 1615  cefTRIAXone (ROCEPHIN) 1 g in sodium chloride 0.9 % 100 mL IVPB       ? 06/01/21 1606 06/01/21 1900  ? ?  ?  ? ?Devices ?  ? ?LINES / TUBES:  ? ? ? ? ?Continuous Infusions: ? sodium chloride 75 mL/hr at 06/06/21 0514  ? chlorproMAZINE (THORAZINE) IV 12.5 mg (06/04/21 1410)  ? ? ? ?Objective: ?Vitals:  ? 06/05/21 1354 06/05/21 2011 06/06/21 0522 06/06/21 0526  ?BP: 119/70 132/83  126/68  ?Pulse: 86 94  91  ?Resp: '16 18  18  '$ ?Temp: 97.9 ?F (36.6 ?C) 98 ?F (36.7 ?C)  98.2 ?F (36.8 ?C)  ?TempSrc: Oral Oral  Oral  ?SpO2: 97% 96%  92%  ?Weight:   56.1 kg   ?Height:      ? ? ?Intake/Output Summary (Last 24 hours) at 06/06/2021 1120 ?Last data filed at 06/06/2021 [phone] ?Gross per 24 hour  ?Intake 1408.32 ml  ?Output 3200 ml  ?Net  -1791.68 ml  ? ? ?Filed Weights  ? 06/03/21 1007 06/05/21 0500 06/06/21 0522  ?Weight: 54.9 kg 57.8 kg 56.1 kg  ? ? ?Examination: ? ?General: A/O x1 (does not know where, when, why)., cooperative pleasant, No ac

## 2021-06-06 NOTE — Plan of Care (Signed)
  Problem: Activity: Goal: Ability to tolerate increased activity will improve Outcome: Progressing   Problem: Respiratory: Goal: Ability to maintain a clear airway will improve Outcome: Progressing   

## 2021-06-06 NOTE — TOC Progression Note (Signed)
Transition of Care (TOC) - Progression Note  ? ? ?Patient Details  ?Name: Stephanie Donovan ?MRN: [id number] ?Date of Birth: [birth year] ? ?Transition of Care (TOC) CM/SW Contact  ?Purcell Mouton, RN ?Phone Number: ?06/06/2021, 3:48 PM ? ?Clinical Narrative:    ?Pt is COVID+, SNF will not take pt until 5/21 on day 11 after COVID. Pt's daughter is between ALF and SNF. There are only 3 bed offers for this pt.  ? ? ?Expected Discharge Plan: [place] ?Barriers to Discharge: No Barriers Identified ? ?Expected Discharge Plan and Services ?Expected Discharge Plan: [place] ?  ?  ?Post Acute Care Choice: [place] ?Living arrangements for the past 2 months: [place] ?                ?  ?  ?  ?  ?  ?  ?  ?  ?  ?  ? ? ?Social Determinants of Health (SDOH) Interventions ?  ? ?Readmission Risk Interventions ?   ? View : No data to display.  ?  ?  ?  ? ? ?

## 2021-06-07 DIAGNOSIS — R55 Syncope and collapse: Secondary | ICD-10-CM | POA: Diagnosis not present

## 2021-06-07 LAB — COMPREHENSIVE METABOLIC PANEL
ALT: 13 U/L (ref 0–44)
AST: 21 U/L (ref 15–41)
Albumin: 2.7 g/dL — ABNORMAL LOW (ref 3.5–5.0)
Alkaline Phosphatase: 68 U/L (ref 38–126)
Anion gap: 6 (ref 5–15)
BUN: 7 mg/dL — ABNORMAL LOW (ref 8–23)
CO2: 27 mmol/L (ref 22–32)
Calcium: 8.2 mg/dL — ABNORMAL LOW (ref 8.9–10.3)
Chloride: 102 mmol/L (ref 98–111)
Creatinine, Ser: 0.32 mg/dL — ABNORMAL LOW (ref 0.44–1.00)
GFR, Estimated: >60 mL/min (ref >60)
Glucose, Bld: 85 mg/dL (ref 70–99)
Potassium: 4.1 mmol/L (ref 3.5–5.1)
Sodium: 135 mmol/L (ref 135–145)
Total Bilirubin: 0.6 mg/dL (ref 0.3–1.2)
Total Protein: 5.8 g/dL — ABNORMAL LOW (ref 6.5–8.1)

## 2021-06-07 LAB — CBC WITH DIFFERENTIAL/PLATELET
Abs Immature Granulocytes: 0.01 10*3/uL (ref 0.00–0.07)
Basophils Absolute: 0 10*3/uL (ref 0.0–0.1)
Basophils Relative: 1 %
Eosinophils Absolute: 0.1 10*3/uL (ref 0.0–0.5)
Eosinophils Relative: 2 %
HCT: 35.7 % — ABNORMAL LOW (ref 36.0–46.0)
Hemoglobin: 11.6 g/dL — ABNORMAL LOW (ref 12.0–15.0)
Immature Granulocytes: 0 %
Lymphocytes Relative: 33 %
Lymphs Abs: 1.4 10*3/uL (ref 0.7–4.0)
MCH: 28.4 pg (ref 26.0–34.0)
MCHC: 32.5 g/dL (ref 30.0–36.0)
MCV: 87.3 fL (ref 80.0–100.0)
Monocytes Absolute: 0.5 10*3/uL (ref 0.1–1.0)
Monocytes Relative: 12 %
Neutro Abs: 2.2 10*3/uL (ref 1.7–7.7)
Neutrophils Relative %: 52 %
Platelets: 148 10*3/uL — ABNORMAL LOW (ref 150–400)
RBC: 4.09 MIL/uL (ref 3.87–5.11)
RDW: 13.8 % (ref 11.5–15.5)
WBC: 4.3 10*3/uL (ref 4.0–10.5)
nRBC: 0 % (ref 0.0–0.2)

## 2021-06-07 LAB — LACTATE DEHYDROGENASE: LDH: 189 U/L (ref 98–192)

## 2021-06-07 LAB — D-DIMER, QUANTITATIVE: D-Dimer, Quant: 2.75 ug/mL-FEU — ABNORMAL HIGH (ref 0.00–0.50)

## 2021-06-07 LAB — MAGNESIUM: Magnesium: 2.2 mg/dL (ref 1.7–2.4)

## 2021-06-07 LAB — C-REACTIVE PROTEIN: CRP: 1.2 mg/dL — ABNORMAL HIGH (ref <1.0)

## 2021-06-07 LAB — PHOSPHORUS: Phosphorus: 2.7 mg/dL (ref 2.5–4.6)

## 2021-06-07 LAB — FERRITIN: Ferritin: 521 ng/mL — ABNORMAL HIGH (ref 11–307)

## 2021-06-07 NOTE — Progress Notes (Signed)
Physical Therapy Treatment ?Patient Details ?Name: Stephanie Donovan ?MRN: [id number] ?DOB: [birth year] ?Today's Date: 06/07/2021 ? ? ?History of Present Illness TAYELOR OSBORNE is a 88 y.o. 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 Presented with generalized fatigue weakness and syncopal event  Found to be COVID positive. ? ?  ?PT Comments  ? ? General Comments: AxO x 1.5 able to follow cammands and express basic needs but poor ST memory and incont B/B.  Assisted OOB to chair was difficult.  General bed mobility comments: pt required Mod Asisst to roll side to side several times for peri care and complete bed change.  Pt required Max Asssit to transition from supine to EOB.  Poor dynamic sitting balnce and fair static.  HIGH FALL RISK. General transfer comment: pt was UNABLE  to perform a traditional sit to stand (as performed at last session) due to weakness, so performed a lateral scoot from elevated bed to straight back chair (simulating wheelchair) with Max/Total Assist to complete but then required greater assist to laterally scoot upward from straight back chair to bed. Then performed side to side rolling several times to perform peri care from B/B incont and a full bed change. ?Per chart review pt was living home alone.  Per chart review Daughter is considering ALF level.  Based on today's session and LPT eval, pt will need SNF first in hopes she can regain her prior level of transfers and wc mobility.  I feel she needs too much care for ALF. ? ? ?  ?Recommendations for follow up therapy are one component of a multi-disciplinary discharge planning process, led by the attending physician.  Recommendations may be updated based on patient status, additional functional  criteria and insurance authorization. ? ?Follow Up Recommendations ? Skilled nursing-short term rehab (<3 hours/day) ?  ?  ?Assistance Recommended at Discharge Frequent or constant Supervision/Assistance  ?Patient can return home with the following Two people to help with walking and/or transfers;A lot of help with bathing/dressing/bathroom;Assistance with cooking/housework;Direct supervision/assist for medications management;Direct supervision/assist for financial management;Assist for transportation;Help with stairs or ramp for entrance ?  ?Equipment Recommendations ? None recommended by PT  ?  ?Recommendations for Other Services   ? ? ?  ?Precautions / Restrictions Precautions ?Precautions: Fall ?Precaution Comments: s/p R AKA no prothesis/incont B/B ?Restrictions ?Weight Bearing Restrictions: No ?Other Position/Activity Restrictions: NWB R LE S/P AKA  ?  ? ?Mobility ? Bed Mobility ?Overal bed mobility: Needs Assistance ?Bed Mobility: Rolling ?Rolling: Mod assist ?  ?Supine to sit: Max assist, Total assist ?  ?  ?General bed mobility comments: pt required Mod Asisst to roll side to side several times for peri care and complete bed change.  Pt required Max Asssit to transition from supine to EOB.  Poor dynamic sitting balnce and fair static.  HIGH FALL RISK. ?  ? ?Transfers ?Overall transfer level: Needs assistance ?Equipment used: None ?Transfers: Bed to chair/wheelchair/BSC ?Sit to Stand: Mod assist, Max assist ?  ?  ?  ?  ? Lateral/Scoot Transfers: Max assist, Total assist ?General transfer comment: pt was UNABLE  to perform a traditional sit to stand (as performed at last session) due to weakness, so performed a lateral scoot from elevated bed to straight back chair (simulating wheelchair) with Max/Total Assist to  complete but then required greater assist to laterally scoot upward from straight back chair to bed. ?  ? ?Ambulation/Gait ?  ?  ?  ?  ?  ?  ?  ?General Gait Details: non amb ? ? ?Stairs ?  ?  ?  ?   ?  ? ? ?Wheelchair Mobility ?  ? ?Modified Rankin (Stroke Patients Only) ?  ? ? ?  ?Balance   ?  ?  ?  ?  ?  ?  ?  ?  ?  ?  ?  ?  ?  ?  ?  ?  ?  ?  ?  ? ?  ?Cognition Arousal/Alertness: Awake/alert ?Behavior During Therapy: [facility] for tasks assessed/performed ?Overall Cognitive Status: History of cognitive impairments - at baseline ?  ?  ?  ?  ?  ?  ?  ?  ?  ?  ?  ?  ?  ?  ?  ?  ?General Comments: AxO x 1.5 able to follow cammands and express basic needs but poor ST memory and incont B/B ?  ?  ? ?  ?Exercises   ? ?  ?General Comments   ?  ?  ? ?Pertinent Vitals/Pain Pain Assessment ?Pain Assessment: No/denies pain  ? ? ?Home Living   ?  ?  ?  ?  ?  ?  ?  ?  ?  ?   ?  ?Prior Function    ?  ?  ?   ? ?PT Goals (current goals can now be found in the care plan section) Progress towards PT goals: Progressing toward goals ? ?  ?Frequency ? ? ? Min 2X/week ? ? ? ?  ?PT Plan Current plan remains appropriate  ? ? ?Co-evaluation   ?  ?  ?  ?  ? ?  ?AM-PAC PT "6 Clicks" Mobility   ?Outcome Measure ? Help needed turning from your back to your side while in a flat bed without using bedrails?: A Lot ?Help needed moving from lying on your back to sitting on the side of a flat bed without using bedrails?: A Lot ?Help needed moving to and from a bed to a chair (including a wheelchair)?: A Lot ?Help needed standing up from a chair using your arms (e.g., wheelchair or bedside chair)?: A Lot ?Help needed to walk in hospital room?: Total ?Help needed climbing 3-5 steps with a railing? : Total ?6 Click Score: 10 ? ?  ?End of Session Equipment Utilized During Treatment: Gait belt ?Activity Tolerance: Patient limited by fatigue ?Patient left: in bed;with call bell/phone within reach;with bed alarm set ?Nurse Communication: Mobility status ?PT Visit Diagnosis: Unsteadiness on feet (R26.81);Other abnormalities of gait and mobility (R26.89);Muscle weakness (generalized) (M62.81) ?  ? ? ?Time: 5277-8242 ?PT Time Calculation (min) (ACUTE ONLY): 33  min ? ?Charges:  $Therapeutic Activity: 23-37 mins          ?          ?Rica Koyanagi  PTA ?Acute  Rehabilitation Services ?Pager      [phone] ?Office      [phone] ? ?

## 2021-06-07 NOTE — Care Management Important Message (Signed)
Important Message ? ?Patient Details IM Letter placed in Patients room. ?Name: Stephanie Donovan ?MRN: [id number] ?Date of Birth: 01-Jan-1928 ? ? ?Medicare Important Message Given:  Yes ? ? ? ? ?Kerin Salen ?06/07/2021, 9:29 AM ?

## 2021-06-07 NOTE — Progress Notes (Signed)
?PROGRESS NOTE ? ? ? ?Stephanie Donovan  LZJ:[id number] DOB: 1927/04/10 DOA: 06/01/2021 ?PCP: Clovia Cuff, MD  ? ?Brief Narrative: ?88 year old with past medical history significant for dementia, hypothyroidism, hypertension, A-fib, status post right knee AKA secondary to infected hardware, history of syncope, recurring UTI, chronic back pain, TIA, constipation presenting with generalized weakness and fatigue and after a syncopal episode.  Patient presented to the ED complaining of cough, she lives with her daughter who tested positive for COVID the day of admission.  Patient at baseline is wheelchair-bound status post AKA.  Patient was noted to have a syncopal episode while sitting in her chair.  Resuscitation and rapidly improved without any intervention.  Patient reported poor oral intake. ? ?Patient was found to have COVID, admitted for syncope episode. ? ? ?Assessment & Plan: ?  ?Principal Problem: ?  Syncope ?Active Problems: ?  CAP (community acquired pneumonia) ?  Paroxysmal A-fib ([place]) ?  COVID-19 virus infection ?  Hypothyroidism ?  Chronic diastolic CHF (congestive heart failure) ([place]) ?  Glaucoma ?  Hx of TIA (transient ischemic attack) and stroke ?  Dehydration ?  Hyponatremia ?  Acute lower UTI ?  Above knee amputation of right lower extremity ([place]) ?  Atypical nevus of left lower leg ?  E. coli UTI (urinary tract infection) ? ?1-COVID-19 virus infection: ?Completed a course of Paxlovid ?Continue to be on room air ? ?2-CAP:ruled out.  ?CTA  negative for consolidation or pulmonary edema. ?She was initially treated with azithromycin and subsequently discontinue after low procalcitonin level. ? ?3-Syncope: ?Troponin negative ?CTA negative for PE. ?Echo: Ejection fraction 60 to 65%.  Normal left ventricular function.  No regional wall motion abnormality.  Grade 1 diastolic dysfunction. ?Receive hydration. ? ?4-chronic diastolic heart failure: ?Hold IV fluids.  ?Might need to resume lasix tomorrow.   ? ?5-paroxysmal A-fib: ?On PRN metoprolol/ ? ?Hyponatremia: Resolved ? ?Left knee amputation of right lower extremity: ?Will need safe disposition ? ?UTI, urine culture positive for E. coli: Completed 5 days of Rocephin ? ?History of TIA, ?Presume allergie to aspirin.  ? ?Atypical nevus of the left lower leg: Need dermatology referral ? ?Disposition: Awaiting safe disposition. ? ?Nutrition Problem: Increased nutrient needs ?Etiology: acute illness ? ? ? ?Signs/Symptoms: estimated needs ? ? ? ?Interventions: Ensure Enlive (each supplement provides 350kcal and 20 grams of protein), MVI ? ?Estimated body mass index is 23.34 kg/m? as calculated from the following: ?  Height as of this encounter: 5' (1.524 m). ?  Weight as of this encounter: 54.2 kg. ? ? ?DVT prophylaxis: Lovenox ?Code Status: DNR ?Family Communication:no family at bediside ?Disposition Plan:  ?Status is: Inpatient ?Remains inpatient appropriate because: admitted with covid, syncope.  ? ? ? ?Consultants:  ?None ? ?Procedures:  ?ECHO ? ?Antimicrobials:  ? ? ?Subjective: ?She is alert, denies pain, denies dyspnea.  ? ?Objective: ?Vitals:  ? 06/06/21 1946 06/07/21 0458 06/07/21 0500 06/07/21 1150  ?BP: (!) 147/83 123/79  123/75  ?Pulse: 80 82  94  ?Resp: '16 16  16  '$ ?Temp: 97.8 ?F (36.6 ?C) 98.4 ?F (36.9 ?C)  98.2 ?F (36.8 ?C)  ?TempSrc: Oral   Oral  ?SpO2: 96% 96%  96%  ?Weight:   54.2 kg   ?Height:      ? ? ?Intake/Output Summary (Last 24 hours) at 06/07/2021 1324 ?Last data filed at 06/07/2021 1151 ?Gross per 24 hour  ?Intake 1417.36 ml  ?Output 1200 ml  ?Net 217.36 ml  ? ?Filed Weights  ?  06/05/21 0500 06/06/21 0522 06/07/21 0500  ?Weight: 57.8 kg 56.1 kg 54.2 kg  ? ? ?Examination: ? ?General exam: Appears calm and comfortable  ?Respiratory system: Clear to auscultation. Respiratory effort normal. ?Cardiovascular system: S1 & S2 heard, RRR.  ?Gastrointestinal system: Abdomen is nondistended, soft and nontender. No organomegaly or masses felt. Normal bowel  sounds heard. ?Central nervous system: Alert and oriented.  ?Extremities: S/P Right AKA ? ?Data Reviewed: I have personally reviewed following labs and imaging studies ? ?CBC: ?Recent Labs  ?Lab 06/03/21 ?0416 06/04/21 ?0342 06/05/21 ?8315 06/06/21 ?0402 06/07/21 ?0409  ?WBC 3.5* 4.4 3.3* 4.7 4.3  ?NEUTROABS 2.1 2.5 1.3* 2.6 2.2  ?HGB 11.6* 11.9* 11.1* 11.0* 11.6*  ?HCT 36.7 37.0 34.1* 34.1* 35.7*  ?MCV 88.6 86.9 88.8 88.3 87.3  ?PLT PLATELET CLUMPS NOTED ON SMEAR, UNABLE TO ESTIMATE 182 175 181 148*  ? ?Basic Metabolic Panel: ?Recent Labs  ?Lab 06/03/21 ?0416 06/04/21 ?0342 06/05/21 ?1761 06/06/21 ?0402 06/07/21 ?0409  ?NA 130* 135 137 135 135  ?K 3.6 3.7 4.1 3.9 4.1  ?CL 96* 100 102 102 102  ?CO2 '26 30 30 28 27  '$ ?GLUCOSE 92 91 89 91 85  ?BUN '10 11 13 9 '$ 7*  ?CREATININE 0.36* 0.32* 0.37* 0.32* 0.32*  ?CALCIUM 8.2* 7.9* 7.8* 7.9* 8.2*  ?MG 1.8 1.8 1.9 2.0 2.2  ?PHOS 3.2 2.6 3.4 2.4* 2.7  ? ?GFR: ?Estimated Creatinine Clearance: 32.9 mL/min (A) (by C-G formula based on SCr of 0.32 mg/dL (L)). ?Liver Function Tests: ?Recent Labs  ?Lab 06/03/21 ?0416 06/04/21 ?0342 06/05/21 ?6073 06/06/21 ?0402 06/07/21 ?0409  ?AST '23 19 15 15 21  '$ ?ALT '19 19 16 15 13  '$ ?ALKPHOS 81 83 72 73 68  ?BILITOT 0.6 0.4 0.3 0.5 0.6  ?PROT 5.8* 5.9* 5.4* 5.6* 5.8*  ?ALBUMIN 2.7* 2.9* 2.5* 2.6* 2.7*  ? ?No results for input(s): LIPASE, AMYLASE in the last 168 hours. ?No results for input(s): AMMONIA in the last 168 hours. ?Coagulation Profile: ?No results for input(s): INR, PROTIME in the last 168 hours. ?Cardiac Enzymes: ?Recent Labs  ?Lab 06/01/21 ?1933  ?CKTOTAL 17*  ? ?BNP (last 3 results) ?No results for input(s): PROBNP in the last 8760 hours. ?HbA1C: ?No results for input(s): HGBA1C in the last 72 hours. ?CBG: ?Recent Labs  ?Lab 06/01/21 ?1221  ?GLUCAP 125*  ? ?Lipid Profile: ?No results for input(s): CHOL, HDL, LDLCALC, TRIG, CHOLHDL, LDLDIRECT in the last 72 hours. ?Thyroid Function Tests: ?No results for input(s): TSH, T4TOTAL, FREET4,  T3FREE, THYROIDAB in the last 72 hours. ?Anemia Panel: ?Recent Labs  ?  06/06/21 ?0402 06/07/21 ?0409  ?FERRITIN 181 521*  ? ?Sepsis Labs: ?Recent Labs  ?Lab 06/01/21 ?1226 06/01/21 ?1500 06/01/21 ?1933  ?PROCALCITON  --   --  <0.10  ?LATICACIDVEN 2.0* 1.1  --   ? ? ?Recent Results (from the past 240 hour(s))  ?Blood culture (routine x 2)     Status: None  ? Collection Time: 06/01/21 12:20 PM  ? Specimen: BLOOD  ?Result Value Ref Range Status  ? Specimen Description   Final  ?  BLOOD RIGHT ANTECUBITAL ?Performed at [facility], [address], [place], [place] [address] ?  ? Special Requests   Final  ?  BOTTLES DRAWN AEROBIC AND ANAEROBIC Blood Culture adequate volume ?Performed at [facility], [address] [place], [place] [address] ?  ? Culture   Final  ?  NO GROWTH 5 DAYS ?Performed at [facility], [place]  [address] [place], [place] [address] ?  ? Report Status 06/06/2021 FINAL  Final  ?Resp Panel by RT-PCR (Flu A&B, Covid) Nasopharyngeal Swab     Status: Abnormal  ? Collection Time: 06/01/21 12:27 PM  ? Specimen: Nasopharyngeal Swab; Nasopharyngeal(NP) swabs in vial transport medium  ?Result Value Ref Range Status  ? SARS Coronavirus 2 by RT PCR POSITIVE (A) NEGATIVE Final  ?  Comment: (NOTE) ?SARS-CoV-2 target nucleic acids are DETECTED. ? ?The SARS-CoV-2 RNA is generally detectable in upper respiratory ?specimens during the acute phase of infection. Positive results are ?indicative of the presence of the identified virus, but do not rule ?out bacterial infection or co-infection with other pathogens not ?detected by the test. Clinical correlation with patient history and ?other diagnostic information is necessary to determine patient ?infection status. The expected result is Negative. ? ?Fact Sheet for Patients: ?EntrepreneurPulse.com.au ? ?Fact Sheet for Healthcare Providers: ?IncredibleEmployment.be ? ?This test is not yet  approved or cleared by the [place] FDA and  ?has been authorized for detection and/or diagnosis of SARS-CoV-2 by ?FDA under an Emergency Use Authorization (EUA).  This EUA will ?remain in effect (meaning

## 2021-06-07 NOTE — Plan of Care (Signed)
  Problem: Activity: Goal: Ability to tolerate increased activity will improve Outcome: Progressing   Problem: Respiratory: Goal: Ability to maintain a clear airway will improve Outcome: Progressing   

## 2021-06-08 DIAGNOSIS — R55 Syncope and collapse: Secondary | ICD-10-CM | POA: Diagnosis not present

## 2021-06-08 MED ORDER — CHLORPROMAZINE HCL 10 MG PO TABS
10.0000 mg | ORAL_TABLET | Freq: Three times a day (TID) | ORAL | Status: DC | PRN
  Administered 2021-06-09 – 2021-06-10 (×2): 10 mg via ORAL
  Filled 2021-06-08 (×3): qty 1

## 2021-06-08 MED ORDER — PANTOPRAZOLE SODIUM 40 MG PO TBEC
40.0000 mg | DELAYED_RELEASE_TABLET | Freq: Every day | ORAL | Status: DC
  Administered 2021-06-08 – 2021-06-11 (×4): 40 mg via ORAL
  Filled 2021-06-08 (×4): qty 1

## 2021-06-08 NOTE — TOC Progression Note (Signed)
Transition of Care [facility]) - Progression Note    Patient Details  Name: Stephanie Donovan MRN: [id number] Date of Birth: [birth year]  Transition of [facility]) CM/SW Contact  Purcell Mouton, RN Phone Number: 06/08/2021, 10:12 AM  Clinical Narrative:    CM has called daughter Tuesday, Wednesday and Today with no answer. May need to send police for a well check.    Expected Discharge Plan: [place] Barriers to Discharge: No Barriers Identified  Expected Discharge Plan and Services Expected Discharge Plan: [place]: [place] arrangements for the past 2 months: Single Family Home                                       Social Determinants of Health (SDOH) Interventions    Readmission Risk Interventions     View : No data to display.

## 2021-06-08 NOTE — Progress Notes (Signed)
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  Patient presented to the ED complaining of cough, she lives with her daughter who tested positive for COVID the day of admission.  Patient at baseline is wheelchair-bound status post AKA.  Patient was noted to have a syncopal episode while sitting in her chair.  Resuscitation and rapidly improved without any intervention.  Patient reported poor oral intake.  Patient was found to have COVID, admitted for syncope episode.   Assessment & Plan:   Principal Problem:   Syncope Active Problems:   CAP (community acquired pneumonia)   Paroxysmal A-fib ([place])   COVID-19 virus infection   Hypothyroidism   Chronic diastolic CHF (congestive heart failure) (HCC)   Glaucoma   Hx of TIA (transient ischemic attack) and stroke   Dehydration   Hyponatremia   Acute lower UTI   Above knee amputation of right lower extremity (HCC)   Atypical nevus of left lower leg   E. coli UTI (urinary tract infection)  1-COVID-19 virus infection: Completed a course of Paxlovid Continue to be on room air  2-CAP:ruled out.  CTA  negative for consolidation or pulmonary edema. She was initially treated with azithromycin and subsequently discontinue after low procalcitonin level.  3-Syncope: Troponin negative CTA negative for PE. Echo: Ejection fraction 60 to 65%.  Normal left ventricular function.  No regional wall motion abnormality.  Grade 1 diastolic dysfunction. Receive hydration.  4-chronic diastolic heart failure: Hold IV fluids.  Might need to resume lasix tomorrow.    5-paroxysmal A-fib: On PRN metoprolol/  Hyponatremia: Resolved  Left knee amputation of right lower extremity: Will need safe disposition  UTI, urine culture positive for E. coli: Completed 5 days of Rocephin  History of TIA, Presume allergie to aspirin.   Atypical nevus of the left lower leg: Need dermatology referral Nausea;  Change thorazine to oral.  Having Bowel movement.   Disposition: Awaiting safe disposition.  Nutrition Problem: Increased nutrient needs Etiology: acute illness    Signs/Symptoms: estimated needs    Interventions: Ensure Enlive (each supplement provides 350kcal and 20 grams of protein), MVI  Estimated body mass index is 23.25 kg/m as calculated from the following:   Height as of this encounter: 5' (1.524 m).   Weight as of this encounter: 54 kg.   DVT prophylaxis: Lovenox Code Status: DNR Family Communication:no family at bediside Disposition Plan:  Status is: Inpatient Remains inpatient appropriate because: admitted with covid, syncope.     Consultants:  None  Procedures:  ECHO  Antimicrobials:    Subjective: She report nausea today. She has had BM per nursing staff.   Objective: Vitals:   06/07/21 2004 06/08/21 0500 06/08/21 0517 06/08/21 1209  BP: (!) 155/89  132/72 131/71  Pulse: (!) 105  83 86  Resp: '18  16 16  '$ Temp: 98 F (36.7 C)  98.2 F (36.8 C) 98.4 F (36.9 C)  TempSrc: Oral  Oral Oral  SpO2: 97%  98% 98%  Weight:  54 kg    Height:        Intake/Output Summary (Last 24 hours) at 06/08/2021 1445 Last data filed at 06/08/2021 0518 Gross per 24 hour  Intake --  Output 650 ml  Net -650 ml    Filed Weights   06/06/21 0522 06/07/21 0500 06/08/21 0500  Weight: 56.1 kg 54.2 kg 54 kg    Examination:  General exam: NAD Respiratory system: CTA Cardiovascular system: S 1, S 2 RRR Gastrointestinal system: BS present, soft nt Central nervous system: alert Extremities: S/P Right AKA  Data Reviewed: I  have personally reviewed following labs and imaging studies  CBC: Recent Labs  Lab 06/03/21 0416 06/04/21 0342 06/05/21 0349 06/06/21 0402 06/07/21 0409  WBC 3.5* 4.4 3.3* 4.7 4.3  NEUTROABS 2.1 2.5 1.3* 2.6 2.2  HGB 11.6* 11.9* 11.1* 11.0* 11.6*  HCT 36.7 37.0 34.1* 34.1* 35.7*  MCV 88.6 86.9 88.8 88.3 87.3  PLT PLATELET CLUMPS NOTED ON SMEAR, UNABLE TO ESTIMATE 182 175 181 148*    Basic Metabolic Panel: Recent Labs  Lab 06/03/21 0416 06/04/21 0342 06/05/21 0349 06/06/21 0402 06/07/21 0409  NA 130* 135 137 135 135  K 3.6 3.7 4.1 3.9 4.1  CL 96* 100 102 102 102  CO2 '26 30 30 28 27  '$ GLUCOSE 92 91 89 91 85  BUN '10 11 13 9 '$ 7*  CREATININE 0.36* 0.32* 0.37* 0.32* 0.32*  CALCIUM 8.2* 7.9* 7.8* 7.9* 8.2*  MG 1.8 1.8 1.9 2.0 2.2  PHOS 3.2 2.6 3.4 2.4* 2.7    GFR: Estimated Creatinine Clearance: 32.9 mL/min (A) (by C-G formula based on SCr of 0.32 mg/dL (L)). Liver Function Tests: Recent Labs  Lab 06/03/21 0416 06/04/21 0342 06/05/21 0349 06/06/21 0402 06/07/21 0409  AST '23 19 15 15 21  '$ ALT '19 19 16 15 13  '$ ALKPHOS 81 83 72 73 68  BILITOT 0.6 0.4 0.3 0.5 0.6  PROT 5.8* 5.9* 5.4* 5.6* 5.8*  ALBUMIN 2.7* 2.9* 2.5* 2.6* 2.7*    No results for input(s): LIPASE, AMYLASE in the last 168 hours. No results for input(s): AMMONIA in the last 168 hours. Coagulation Profile: No results for input(s): INR, PROTIME in the last 168 hours. Cardiac Enzymes: Recent Labs  Lab 06/01/21 1933  CKTOTAL 17*    BNP (last 3 results) No results for input(s): PROBNP in the last 8760 hours. HbA1C: No results for input(s): HGBA1C in the last 72 hours. CBG: No results for input(s): GLUCAP in the last 168 hours.  Lipid Profile: No results for input(s): CHOL, HDL, LDLCALC, TRIG, CHOLHDL, LDLDIRECT in the last 72 hours. Thyroid Function Tests: No results for input(s): TSH, T4TOTAL, FREET4, T3FREE, THYROIDAB in the last 72 hours. Anemia Panel: Recent Labs    06/06/21 0402  06/07/21 0409  FERRITIN 181 521*    Sepsis Labs: Recent Labs  Lab 06/01/21 1500 06/01/21 1933  PROCALCITON  --  <0.10  LATICACIDVEN 1.1  --      Recent Results (from the past 240 hour(s))  Blood culture (routine x 2)     Status: None   Collection Time: 06/01/21 12:20 PM   Specimen: BLOOD  Result Value Ref Range Status   Specimen Description   Final    BLOOD RIGHT ANTECUBITAL Performed at [facility], [address], [place], [place] [address]    Special Requests   Final    BOTTLES DRAWN AEROBIC AND ANAEROBIC Blood Culture adequate volume Performed at [facility], [address], [place], [place] [address]    Culture   Final    NO GROWTH 5 DAYS Performed at [facility], [place] [address], [place], [place] [address]    Report Status 06/06/2021  FINAL  Final  Resp Panel by RT-PCR (Flu A&B, Covid) Nasopharyngeal Swab     Status: Abnormal   Collection Time: 06/01/21 12:27 PM   Specimen: Nasopharyngeal Swab; Nasopharyngeal(NP) swabs in vial transport medium  Result Value Ref Range Status   SARS Coronavirus 2 by RT PCR POSITIVE (A) NEGATIVE Final    Comment: (NOTE) SARS-CoV-2 target nucleic acids are DETECTED.  The SARS-CoV-2 RNA is generally detectable in upper respiratory specimens during the acute phase of infection. Positive results are indicative of the presence of the identified virus, but do not rule out bacterial infection or co-infection with other pathogens not detected by the test. Clinical correlation with patient history and other diagnostic information is necessary to determine patient infection status. The expected result is Negative.  Fact Sheet for Patients: EntrepreneurPulse.com.au  Fact Sheet for Healthcare Providers: IncredibleEmployment.be  This test is not yet approved or cleared by the [place] FDA and  has been authorized for detection and/or diagnosis of  SARS-CoV-2 by FDA under an Emergency Use Authorization (EUA).  This EUA will remain in effect (meaning this test can be used) for the duration of  the COVID-19 declaration under Section 564(b)(1) of the A ct, 21 U.S.C. section 360bbb-3(b)(1), unless the authorization is terminated or revoked sooner.     Influenza A by PCR NEGATIVE NEGATIVE Final   Influenza B by PCR NEGATIVE NEGATIVE Final    Comment: (NOTE) The Xpert Xpress SARS-CoV-2/FLU/RSV plus assay is intended as an aid in the diagnosis of influenza from Nasopharyngeal swab specimens and should not be used as a sole basis for treatment. Nasal washings and aspirates are unacceptable for Xpert Xpress SARS-CoV-2/FLU/RSV testing.  Fact Sheet for Patients: EntrepreneurPulse.com.au  Fact Sheet for Healthcare Providers: IncredibleEmployment.be  This test is not yet approved or cleared by the [place] FDA and has been authorized for detection and/or diagnosis of SARS-CoV-2 by FDA under an Emergency Use Authorization (EUA). This EUA will remain in effect (meaning this test can be used) for the duration of the COVID-19 declaration under Section 564(b)(1) of the Act, 21 U.S.C. section 360bbb-3(b)(1), unless the authorization is terminated or revoked.  Performed at [facility], [address] [place], [place] [address]   Blood culture (routine x 2)     Status: None   Collection Time: 06/01/21 12:49 PM   Specimen: BLOOD RIGHT HAND  Result Value Ref Range Status   Specimen Description   Final    BLOOD RIGHT HAND Performed at [facility], [address], [place], [place] [address]    Special Requests   Final    IN PEDIATRIC BOTTLE Blood Culture results may not be optimal due to an inadequate volume of blood received in culture bottles Performed at [facility], [address] [place], [place] [address]    Culture   Final    NO  GROWTH 5 DAYS Performed at [facility], [place] [address] [place], [place] [address]    Report Status 06/06/2021 FINAL  Final  Urine Culture     Status: Abnormal   Collection Time: 06/01/21  3:59 PM   Specimen: In/Out Cath Urine  Result Value Ref Range Status   Specimen Description   Final    IN/OUT CATH URINE Performed at [facility], [address] [place], [place] [address]    Special Requests   Final    NONE Performed at [facility], [address], [place], [place] [address]    Culture >=  100,000 COLONIES/mL ESCHERICHIA COLI (A)  Final   Report Status 06/03/2021 FINAL  Final   Organism ID, Bacteria ESCHERICHIA COLI (A)  Final      Susceptibility   Escherichia coli - MIC*    AMPICILLIN <=2 SENSITIVE Sensitive     CEFAZOLIN <=4 SENSITIVE Sensitive     CEFEPIME <=0.12 SENSITIVE Sensitive     CEFTRIAXONE <=0.25 SENSITIVE Sensitive     CIPROFLOXACIN <=0.25 SENSITIVE Sensitive     GENTAMICIN <=1 SENSITIVE Sensitive     IMIPENEM <=0.25 SENSITIVE Sensitive     NITROFURANTOIN <=16 SENSITIVE Sensitive     TRIMETH/SULFA <=20 SENSITIVE Sensitive     AMPICILLIN/SULBACTAM <=2 SENSITIVE Sensitive     PIP/TAZO <=4 SENSITIVE Sensitive     * >=100,000 COLONIES/mL ESCHERICHIA COLI          Radiology Studies: No results found.      Scheduled Meds:  brimonidine  1 drop Left Eye BID   enoxaparin (LOVENOX) injection  40 mg Subcutaneous Q24H   feeding supplement  237 mL Oral BID BM   levothyroxine  50 mcg Oral QAC breakfast   multivitamin with minerals  1 tablet Oral Daily   pantoprazole  40 mg Oral Daily   timolol  1 drop Both Eyes BID   Continuous Infusions:     LOS: 6 days    Time spent: 35 minutes.     Elmarie Shiley, MD [facility]   If 7PM-7AM, please contact night-coverage [email or web address]  06/08/2021, 2:45 PM

## 2021-06-08 NOTE — Progress Notes (Signed)
Occupational Therapy Treatment Patient Details Name: Stephanie Donovan MRN: [id number] DOB: 10/25/1927 Today's Date: 06/08/2021   History of present illness Stephanie Donovan is a 88 y.o. 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 Presented with generalized fatigue weakness and syncopal event  Found to be COVID positive.   OT comments  Unfortunately, pt unable to show any progress towards OT goals with refusal for EOB or OOB activity and need of hygiene after bed level bowel incontinence. Pt able to roll with Mod-Max As but with difficulty staying in sidelying position and need of additional assistance to stay on side. Pt with poor participation in [place] with need of P/AAROM and pt quitting midway through active hand exercises due to somnolence.  Pt required Total Assist for all hygiene today may need long term custodial care when she completes her SNF stay.  Pt may still benefit from acute skilled OT services and will continue to follow and encourage increased participation in pt's own self care.    Recommendations for follow up therapy are one component of a multi-disciplinary discharge planning process, led by the attending physician.  Recommendations may be updated based on patient status, additional functional criteria and insurance authorization.    Follow Up Recommendations  Skilled nursing-short term rehab (<3 hours/day)    Assistance Recommended at Discharge Frequent or constant Supervision/Assistance  Patient can return home with the following  Two people to help with walking and/or transfers;A lot of help with bathing/dressing/bathroom;Assistance with cooking/housework;Direct supervision/assist for financial management;Direct supervision/assist  for medications management;Help with stairs or ramp for entrance;Assist for transportation   Equipment Recommendations  None recommended by OT    Recommendations for Other Services      Precautions / Restrictions Precautions Precautions: Fall Precaution Comments: s/p R AKA no prothesis/incont B/B Restrictions Weight Bearing Restrictions: No LLE Weight Bearing: Non weight bearing Other Position/Activity Restrictions: NWB R LE S/P AKA       Mobility Bed Mobility Overal bed mobility: Needs Assistance Bed Mobility: Rolling Rolling: Mod assist         General bed mobility comments: pt required Mod Asisst to roll to RT side with Mod-Max As and rolled LT with Min-Mod As.  Rolled to each side x 2 to allow for hygiene and chuck pad change.    Transfers Overall transfer level:  (Pt declined to try due to nausea)                       Balance                                           ADL either performed or assessed with clinical judgement   ADL Overall ADL's : Needs assistance/impaired                             Toileting- Clothing Manipulation and Hygiene: Total assistance;Bed level Toileting - Clothing Manipulation Details (indicate cue type and reason): Pt found to be incontinent of bowel at bed level with soiled pure wick. Pt required Total assist for all hygiene with difficulty maitnaining sidelying position and need of additional assist  for positioning.     Functional mobility during ADLs: Moderate assistance;Minimal assistance (Rolling only)      Extremity/Trunk Assessment Upper Extremity Assessment Upper Extremity Assessment: Generalized weakness            Vision Patient Visual Report: No change from baseline Additional Comments: Absent eye contact   Perception     Praxis      Cognition Arousal/Alertness: Awake/alert Behavior During Therapy: WFL for tasks assessed/performed Overall Cognitive Status: History of  cognitive impairments - at baseline                                 General Comments: AxO x 1.5 able to follow cammands and express basic needs but poor ST memory and incont B/B        Exercises Other Exercises Other Exercises: P/AAROM Bil shoulders x 10 reps with frequent cues for pt to increase participation and help with arm lifts. Minimal pt involvement.  AAROM: Bicep curls x10, AROM hand open close with pt performing 6 then stopping and closing eyes.    Shoulder Instructions       General Comments      Pertinent Vitals/ Pain       Pain Assessment Pain Assessment: No/denies pain (Denied pain, c/o nausea. RN notified.)  Home Living                                          Prior Functioning/Environment              Frequency  Min 2X/week        Progress Toward Goals  OT Goals(current goals can now be found in the care plan section)  Progress towards OT goals: Not progressing toward goals - comment  Acute Rehab OT Goals OT Goal Formulation: Patient unable to participate in goal setting Time For Goal Achievement: 06/16/21 Potential to Achieve Goals: [place] Discharge plan remains appropriate    Co-evaluation                 AM-PAC OT "6 Clicks" Daily Activity     Outcome Measure   Help from another person eating meals?: A Lot Help from another person taking care of personal grooming?: A Little Help from another person toileting, which includes using toliet, bedpan, or urinal?: Total Help from another person bathing (including washing, rinsing, drying)?: A Lot Help from another person to put on and taking off regular upper body clothing?: A Lot Help from another person to put on and taking off regular lower body clothing?: A Lot 6 Click Score: 12    End of Session    OT Visit Diagnosis: Muscle weakness (generalized) (M62.81)   Activity Tolerance Patient limited by fatigue;Patient limited by lethargy    Patient Left in bed;with call bell/phone within reach;with bed alarm set   Nurse Communication Other (comment) Burnard Leigh and need for purewick)        Time: 4259-5638 OT Time Calculation (min): 25 min  Charges: OT General Charges $OT Visit: 1 Visit OT Treatments $Self Care/Home Management : 8-22 mins $Therapeutic Activity: 8-22 mins  Anderson [place], [place] Office: [phone] 06/08/2021  Julien Girt 06/08/2021, 1:22 PM

## 2021-06-09 DIAGNOSIS — R55 Syncope and collapse: Secondary | ICD-10-CM | POA: Diagnosis not present

## 2021-06-09 LAB — BASIC METABOLIC PANEL
Anion gap: 7 (ref 5–15)
BUN: 11 mg/dL (ref 8–23)
CO2: 28 mmol/L (ref 22–32)
Calcium: 8.5 mg/dL — ABNORMAL LOW (ref 8.9–10.3)
Chloride: 98 mmol/L (ref 98–111)
Creatinine, Ser: 0.35 mg/dL — ABNORMAL LOW (ref 0.44–1.00)
GFR, Estimated: >60 mL/min (ref >60)
Glucose, Bld: 101 mg/dL — ABNORMAL HIGH (ref 70–99)
Potassium: 4.1 mmol/L (ref 3.5–5.1)
Sodium: 133 mmol/L — ABNORMAL LOW (ref 135–145)

## 2021-06-09 LAB — CBC
HCT: 36.3 % (ref 36.0–46.0)
Hemoglobin: 11.8 g/dL — ABNORMAL LOW (ref 12.0–15.0)
MCH: 28.2 pg (ref 26.0–34.0)
MCHC: 32.5 g/dL (ref 30.0–36.0)
MCV: 86.8 fL (ref 80.0–100.0)
Platelets: 254 10*3/uL (ref 150–400)
RBC: 4.18 MIL/uL (ref 3.87–5.11)
RDW: 13.6 % (ref 11.5–15.5)
WBC: 5.5 10*3/uL (ref 4.0–10.5)
nRBC: 0 % (ref 0.0–0.2)

## 2021-06-09 NOTE — TOC Progression Note (Signed)
Transition of Care [facility]) - Progression Note    Patient Details  Name: EMMALINA ESPERICUETA MRN: [id number] Date of Birth: [birth year]  Transition of [facility]) CM/SW Contact  Purcell Mouton, RN Phone Number: 06/09/2021, 10:20 AM  Clinical Narrative:    Daughter has not returned calls since Monday.   Expected Discharge Plan: [place] Barriers to Discharge: No Barriers Identified  Expected Discharge Plan and Services Expected Discharge Plan: [place] Choice: [place] arrangements for the past 2 months: Single Family Home                                       Social Determinants of Health (SDOH) Interventions    Readmission Risk Interventions     View : No data to display.

## 2021-06-09 NOTE — Progress Notes (Signed)
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  Patient presented to the ED complaining of cough, she lives with her daughter who tested positive for COVID the day of admission.  Patient at baseline is wheelchair-bound status post AKA.  Patient was noted to have a syncopal episode while sitting in her chair.  Resuscitation and rapidly improved without any intervention.  Patient reported poor oral intake.  Patient was found to have COVID, admitted for syncope episode.   Assessment & Plan:   Principal Problem:   Syncope Active Problems:   CAP (community acquired pneumonia)   Paroxysmal A-fib ([place])   COVID-19 virus infection   Hypothyroidism   Chronic diastolic CHF (congestive heart failure) (HCC)   Glaucoma   Hx of TIA (transient ischemic attack) and stroke   Dehydration   Hyponatremia   Acute lower UTI   Above knee amputation of right lower extremity (HCC)   Atypical nevus of left lower leg   E. coli UTI (urinary tract infection)  1-COVID-19 virus infection: Completed a course of Paxlovid Continue to be on room air  2-CAP:ruled out.  CTA  negative for consolidation or pulmonary edema. She was initially treated with azithromycin and subsequently discontinue after low procalcitonin level.  3-Syncope: Troponin negative CTA negative for PE. Echo: Ejection fraction 60 to 65%.  Normal left ventricular function.  No regional wall motion abnormality.  Grade 1 diastolic dysfunction. Received hydration.  4-chronic diastolic heart failure: Hold IV fluids.  Hold lasix. Monitor volume status.    5-paroxysmal A-fib: On PRN metoprolol/  Hyponatremia: Resolved  Left knee amputation of right lower extremity: Will need safe disposition  UTI, urine culture positive for E. coli: Completed 5 days of Rocephin  History of TIA, Presume allergie to aspirin.   Atypical nevus of the left lower leg: Need dermatology referral Nausea;  Change thorazine to oral.  Having Bowel movement.   Disposition: Awaiting safe disposition.  Nutrition Problem: Increased nutrient needs Etiology: acute illness    Signs/Symptoms: estimated needs    Interventions: Ensure Enlive (each supplement provides 350kcal and 20 grams of protein), MVI  Estimated body mass index is 23.68 kg/m as calculated from the following:   Height as of this encounter: 5' (1.524 m).   Weight as of this encounter: 55 kg.   DVT prophylaxis: Lovenox Code Status: DNR Family Communication:no family at bediside Disposition Plan:  Status is: Inpatient Remains inpatient appropriate because: admitted with covid, syncope.     Consultants:  None  Procedures:  ECHO  Antimicrobials:    Subjective: She is feeling better today. Denies nausea.   Objective: Vitals:   06/08/21 2210 06/09/21 0440 06/09/21 0441 06/09/21 1219  BP: 120/86 112/80  112/65  Pulse: 96 84  79  Resp: '16 16  18  '$ Temp: 97.7 F (36.5 C) 97.9 F (36.6 C)  97.7 F (36.5 C)  TempSrc: Oral Oral  Oral  SpO2: 100% 96%  98%  Weight:   55 kg   Height:        Intake/Output Summary (Last 24 hours) at 06/09/2021 1455 Last data filed at 06/09/2021 1222 Gross per 24 hour  Intake 360 ml  Output 1500 ml  Net -1140 ml    Filed Weights   06/07/21 0500 06/08/21 0500 06/09/21 0441  Weight: 54.2 kg 54 kg 55 kg    Examination:  General exam: NAD Respiratory system: CTA Cardiovascular system: S 1, S 2 RRR Gastrointestinal system: BS present, soft, nt Central nervous system: Alert Extremities: S/P Right AKA  Data Reviewed: I have personally  reviewed following labs and imaging studies  CBC: Recent Labs  Lab 06/03/21 0416 06/04/21 0342 06/05/21 0349 06/06/21 0402 06/07/21 0409 06/09/21 0816  WBC 3.5* 4.4 3.3* 4.7 4.3 5.5  NEUTROABS 2.1 2.5 1.3* 2.6 2.2  --   HGB 11.6* 11.9* 11.1* 11.0* 11.6* 11.8*  HCT 36.7 37.0 34.1* 34.1* 35.7* 36.3  MCV 88.6 86.9 88.8 88.3 87.3 86.8  PLT PLATELET CLUMPS NOTED ON SMEAR, UNABLE TO ESTIMATE 182 175 181 148* 419    Basic Metabolic Panel: Recent Labs  Lab 06/03/21 0416 06/04/21 0342 06/05/21 0349 06/06/21 0402 06/07/21 0409 06/09/21 0816  NA 130* 135 137 135 135 133*  K 3.6 3.7 4.1 3.9 4.1 4.1  CL 96* 100 102 102 102 98  CO2 '26 30 30 28 27 28  '$ GLUCOSE 92 91 89 91 85 101*  BUN '10 11 13 9 '$ 7* 11  CREATININE 0.36* 0.32* 0.37* 0.32* 0.32* 0.35*  CALCIUM 8.2* 7.9* 7.8* 7.9* 8.2* 8.5*  MG 1.8 1.8 1.9 2.0 2.2  --   PHOS 3.2 2.6 3.4 2.4* 2.7  --     GFR: Estimated Creatinine Clearance: 35.6 mL/min (A) (by C-G formula based on SCr of 0.35 mg/dL (L)). Liver Function Tests: Recent Labs  Lab 06/03/21 0416 06/04/21 0342 06/05/21 0349 06/06/21 0402 06/07/21 0409  AST '23 19 15 15 21  '$ ALT '19 19 16 15 13  '$ ALKPHOS 81 83 72 73 68  BILITOT 0.6 0.4 0.3 0.5 0.6  PROT 5.8* 5.9* 5.4* 5.6* 5.8*  ALBUMIN 2.7* 2.9* 2.5* 2.6* 2.7*    No results for input(s): LIPASE, AMYLASE in the last 168 hours. No results for input(s): AMMONIA in the last 168 hours. Coagulation Profile: No results for input(s): INR, PROTIME in the last 168 hours. Cardiac Enzymes: No results for input(s): CKTOTAL, CKMB, CKMBINDEX, TROPONINI in the last 168 hours.  BNP (last 3 results) No results for input(s): PROBNP in the last 8760 hours. HbA1C: No results for input(s): HGBA1C in the last 72 hours. CBG: No results for input(s): GLUCAP in the last 168 hours.  Lipid Profile: No results for input(s): CHOL, HDL, LDLCALC, TRIG, CHOLHDL, LDLDIRECT in the last 72 hours. Thyroid Function Tests: No results for  input(s): TSH, T4TOTAL, FREET4, T3FREE, THYROIDAB in the last 72 hours. Anemia Panel: Recent Labs    06/07/21 0409  FERRITIN 521*    Sepsis Labs: No results for input(s): PROCALCITON, LATICACIDVEN in the last 168 hours.   Recent Results (from the past 240 hour(s))  Blood culture (routine x 2)     Status: None   Collection Time: 06/01/21 12:20 PM   Specimen: BLOOD  Result Value Ref Range Status   Specimen Description   Final    BLOOD RIGHT ANTECUBITAL Performed at [facility], [address], [place], [place] [address]    Special Requests   Final    BOTTLES DRAWN AEROBIC AND ANAEROBIC Blood Culture adequate volume Performed at [facility], [address], [place], [place] [address]    Culture   Final    NO GROWTH 5 DAYS Performed at [facility], [place]  [address], [place], [place] [address]    Report Status 06/06/2021 FINAL  Final  Resp Panel by RT-PCR (Flu A&B, Covid) Nasopharyngeal Swab     Status: Abnormal   Collection Time: 06/01/21 12:27 PM   Specimen: Nasopharyngeal Swab; Nasopharyngeal(NP) swabs in vial transport medium  Result Value Ref Range Status   SARS Coronavirus 2 by RT PCR POSITIVE (A) NEGATIVE Final    Comment: (NOTE) SARS-CoV-2 target nucleic acids are DETECTED.  The SARS-CoV-2 RNA is generally detectable in upper respiratory specimens during the acute phase of infection. Positive results are indicative of the presence of the identified virus, but do not rule out bacterial infection or co-infection with other pathogens not detected by the test. Clinical correlation with patient history and other diagnostic information is necessary to determine patient infection status. The expected result is Negative.  Fact Sheet for Patients: EntrepreneurPulse.com.au  Fact Sheet for Healthcare Providers: IncredibleEmployment.be  This test is not yet approved or cleared by the [place] FDA and  has been authorized for detection and/or diagnosis of SARS-CoV-2 by FDA under an Emergency Use Authorization (EUA).  This EUA will remain in effect (meaning this test can be used) for the duration of  the COVID-19 declaration under Section 564(b)(1) of the A ct, 21 U.S.C. section 360bbb-3(b)(1), unless the authorization is terminated or revoked sooner.     Influenza A by PCR NEGATIVE NEGATIVE Final   Influenza B by PCR NEGATIVE NEGATIVE Final    Comment: (NOTE) The Xpert Xpress SARS-CoV-2/FLU/RSV plus assay is intended as an aid in the diagnosis of influenza from Nasopharyngeal swab specimens and should not be used as a sole basis for treatment. Nasal washings and aspirates are unacceptable for Xpert Xpress SARS-CoV-2/FLU/RSV testing.  Fact Sheet for Patients: EntrepreneurPulse.com.au  Fact Sheet for Healthcare Providers: IncredibleEmployment.be  This test is not yet approved or cleared by the [place] FDA and has been authorized for detection and/or diagnosis of SARS-CoV-2 by FDA under an Emergency Use Authorization (EUA). This EUA will remain in effect (meaning this test can be used) for the duration of the COVID-19 declaration under Section 564(b)(1) of the Act, 21 U.S.C. section 360bbb-3(b)(1), unless the authorization is terminated or revoked.  Performed at [facility], [address], [place], [place] [address]   Blood culture (routine x 2)     Status: None   Collection Time: 06/01/21 12:49 PM   Specimen: BLOOD RIGHT HAND  Result Value Ref Range Status   Specimen Description   Final    BLOOD RIGHT HAND Performed at [facility], [address] [place], [place] [address]    Special Requests   Final    IN PEDIATRIC BOTTLE Blood Culture results may not be optimal due to an inadequate volume of blood received in culture bottles Performed at [facility],  [address], [place], [place] [address]    Culture   Final    NO GROWTH 5 DAYS Performed at [facility], [place] [address], [place], [place] [address]    Report Status 06/06/2021 FINAL  Final  Urine Culture     Status: Abnormal   Collection Time: 06/01/21  3:59 PM   Specimen: In/Out Cath Urine  Result Value Ref Range Status   Specimen Description   Final    IN/OUT CATH URINE Performed at [facility], [address], [place], [place] [address]    Special Requests   Final    NONE Performed at [facility],  [place], [place] [address]    Culture >=100,000 COLONIES/mL ESCHERICHIA COLI (A)  Final   Report Status 06/03/2021 FINAL  Final   Organism ID, Bacteria ESCHERICHIA COLI (A)  Final      Susceptibility   Escherichia coli - MIC*    AMPICILLIN <=2 SENSITIVE Sensitive     CEFAZOLIN <=4 SENSITIVE Sensitive     CEFEPIME <=0.12 SENSITIVE Sensitive     CEFTRIAXONE <=0.25 SENSITIVE Sensitive     CIPROFLOXACIN <=0.25 SENSITIVE Sensitive     GENTAMICIN <=1 SENSITIVE Sensitive     IMIPENEM <=0.25 SENSITIVE Sensitive     NITROFURANTOIN <=16 SENSITIVE Sensitive     TRIMETH/SULFA <=20 SENSITIVE Sensitive     AMPICILLIN/SULBACTAM <=2 SENSITIVE Sensitive     PIP/TAZO <=4 SENSITIVE Sensitive     * >=100,000 COLONIES/mL ESCHERICHIA COLI          Radiology Studies: No results found.      Scheduled Meds:  brimonidine  1 drop Left Eye BID   enoxaparin (LOVENOX) injection  40 mg Subcutaneous Q24H   feeding supplement  237 mL Oral BID BM   levothyroxine  50 mcg Oral QAC breakfast   multivitamin with minerals  1 tablet Oral Daily   pantoprazole  40 mg Oral Daily   timolol  1 drop Both Eyes BID   Continuous Infusions:     LOS: 7 days    Time spent: 35 minutes.     Elmarie Shiley, MD [facility]   If 7PM-7AM, please contact night-coverage [email or web address]  06/09/2021, 2:55 PM

## 2021-06-10 DIAGNOSIS — R55 Syncope and collapse: Secondary | ICD-10-CM | POA: Diagnosis not present

## 2021-06-10 NOTE — NC FL2 (Signed)
[place] LEVEL OF CARE SCREENING TOOL     IDENTIFICATION  Patient Name: Stephanie Donovan Birthdate: 1927/12/16 Sex: female Admission Date (Current Location): 06/01/2021  [facility] and [place] Number:  Herbalist and Address:  [facility],  [address] [place], [place]      Provider Number: [id number]  Attending Physician Name and Address:  Elmarie Shiley, MD  Relative Name and Phone Number:  [place], Charrier Daughter [phone]    Current Level of Care: Hospital Recommended Level of Care: [place] Prior Approval Number:    Date Approved/Denied:   PASRR Number: [id number]  Discharge Plan: SNF    Current Diagnoses: Patient Active Problem List   Diagnosis Date Noted   E. coli UTI (urinary tract infection) 06/02/2021   CAP (community acquired pneumonia) 06/01/2021   COVID-19 virus infection 06/01/2021   Atypical nevus of left lower leg 06/01/2021   Above knee amputation of right lower extremity ([place]) 10/07/2020   Subacute osteomyelitis of right femur ([place])    Infected hardware in right leg, sequela    Hardware complicating wound infection ([place])    Cellulitis 07/27/2020   Supracondylar fracture of right femur, closed, initial encounter ([place]) 01/01/2018   Closed fracture of right distal femur ([place]) 12/24/2017   Acute blood loss anemia 12/19/2017   Malnutrition of moderate degree 12/18/2017   Femur fracture ([place]) 12/17/2017   Fall 12/17/2017   HLD (hyperlipidemia) 12/17/2017   Chronic anemia 12/17/2017   Closed 4-part fracture of proximal humerus, right, initial encounter 10/09/2017   Paroxysmal A-fib ([place]) 09/21/2017   Closed displaced fracture of right femoral neck ([place]) 09/21/2017   Closed fracture of right proximal humerus 09/21/2017   Dehydration 09/21/2017   Hyponatremia 09/21/2017   Elevated CK 09/21/2017   Thyroid nodule 09/21/2017   Closed right hip fracture ([place]) 09/21/2017   Chronic  diastolic CHF (congestive heart failure) ([place]) 09/21/2017   Sinus tachycardia 09/21/2017   Acute lower UTI 09/21/2017   Hx of TIA (transient ischemic attack) and stroke 08/25/2014   Aphasia 08/25/2014   Chronic constipation 05/19/2014   Fracture of multiple pubic rami ([place]) 04/11/2014   Pubic bone fracture ([place]) 04/11/2014   Left hip pain 04/11/2014   Atrial fibrillation with RVR ([place]) 03/28/2013   Diarrhea 03/28/2013   Hypothyroidism 03/24/2013   Syncope 03/24/2013   Glaucoma 03/24/2013   Hypokalemia 03/24/2013   Acute gastroenteritis 03/24/2013    Orientation RESPIRATION BLADDER Height & Weight     Self, Place  Normal Incontinent Weight: 119 lb 14.9 oz (54.4 kg) Height:  5' (152.4 cm)  BEHAVIORAL SYMPTOMS/MOOD NEUROLOGICAL BOWEL NUTRITION STATUS      Incontinent Diet (heart healthy)  AMBULATORY STATUS COMMUNICATION OF NEEDS Skin   Limited Assist Verbally Other (Comment) (Ecchymosis Arm, Back, HAnd, Bilateral Sacrum Redness, Right AKA)                       Personal Care Assistance Level of Assistance  Bathing, Feeding, Dressing Bathing Assistance: Limited assistance Feeding assistance: Independent Dressing Assistance: Limited assistance     Functional Limitations Info  Sight, Hearing, Speech Sight Info: Impaired (glasses) Hearing Info: Adequate Speech Info: Adequate    SPECIAL CARE FACTORS FREQUENCY  PT (By licensed PT), OT (By licensed OT)     PT Frequency: Minimum 5x week OT Frequency: Minimum 5x week            Contractures Contractures Info: Not present    Additional Factors  Info  Code Status, Allergies Code Status Info: DNR Allergies Info: Aspirin, Penicillins, Codeine           Current Medications (06/10/2021):  This is the current hospital active medication list Current Facility-Administered Medications  Medication Dose Route Frequency Provider Last Rate Last Admin   acetaminophen (TYLENOL) tablet 650 mg  650 mg Oral Q6H PRN Toy Baker, MD   650 mg at 06/09/21 0525   Or   acetaminophen (TYLENOL) suppository 650 mg  650 mg Rectal Q6H PRN Doutova, Anastassia, MD       albuterol (VENTOLIN HFA) 108 (90 Base) MCG/ACT inhaler 2 puff  2 puff Inhalation Q6H PRN Doutova, Anastassia, MD       brimonidine (ALPHAGAN) 0.15 % ophthalmic solution 1 drop  1 drop Left Eye BID Doutova, Anastassia, MD   1 drop at 06/10/21 1008   chlorproMAZINE (THORAZINE) tablet 10 mg  10 mg Oral TID PRN Regalado, Belkys A, MD   10 mg at 06/09/21 1549   enoxaparin (LOVENOX) injection 40 mg  40 mg Subcutaneous Q24H Doutova, Anastassia, MD   40 mg at 06/09/21 2152   feeding supplement (ENSURE ENLIVE / ENSURE PLUS) liquid 237 mL  237 mL Oral BID BM Allie Bossier, MD   237 mL at 06/08/21 1505   guaiFENesin-dextromethorphan (ROBITUSSIN DM) 100-10 MG/5ML syrup 10 mL  10 mL Oral Q4H PRN Doutova, Anastassia, MD   10 mL at 06/06/21 0915   HYDROcodone-acetaminophen (NORCO/VICODIN) 5-325 MG per tablet 1-2 tablet  1-2 tablet Oral Q4H PRN Toy Baker, MD       levothyroxine (SYNTHROID) tablet 50 mcg  50 mcg Oral QAC breakfast Doutova, Anastassia, MD   50 mcg at 06/10/21 0540   metoprolol succinate (TOPROL-XL) 24 hr tablet 12.5 mg  12.5 mg Oral Daily PRN Toy Baker, MD       multivitamin with minerals tablet 1 tablet  1 tablet Oral Daily Allie [place], MD   1 tablet at 06/10/21 1008   ondansetron (ZOFRAN) tablet 4 mg  4 mg Oral Q6H PRN Toy Baker, MD   4 mg at 06/06/21 1038   Or   ondansetron (ZOFRAN) injection 4 mg  4 mg Intravenous Q6H PRN Doutova, Anastassia, MD   4 mg at 06/09/21 1435   pantoprazole (PROTONIX) EC tablet 40 mg  40 mg Oral Daily Regalado, Belkys A, MD   40 mg at 06/10/21 1008   timolol (TIMOPTIC) 0.25 % ophthalmic solution 1 drop  1 drop Both Eyes BID Toy Baker, MD   1 drop at 06/10/21 1008     Discharge Medications: Please see discharge summary for a list of discharge medications.  Relevant Imaging  Results:  Relevant Lab Results:   Additional Information SS# [id number]  Ross Ludwig, LCSW

## 2021-06-10 NOTE — Plan of Care (Signed)
  Problem: Respiratory: Goal: Ability to maintain a clear airway will improve Outcome: Progressing   Problem: Coping: Goal: Psychosocial and spiritual needs will be supported Outcome: Progressing   Problem: Respiratory: Goal: Will maintain a patent airway Outcome: Progressing

## 2021-06-10 NOTE — Progress Notes (Signed)
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  Patient presented to the ED complaining of cough, she lives with her daughter who tested positive for COVID the day of admission.  Patient at baseline is wheelchair-bound status post AKA.  Patient was noted to have a syncopal episode while sitting in her chair.  Resuscitation and rapidly improved without any intervention.  Patient reported poor oral intake.  Patient was found to have COVID, admitted for syncope episode.   Assessment & Plan:   Principal Problem:   Syncope Active Problems:   CAP (community acquired pneumonia)   Paroxysmal A-fib ([place])   COVID-19 virus infection   Hypothyroidism   Chronic diastolic CHF (congestive heart failure) (HCC)   Glaucoma   Hx of TIA (transient ischemic attack) and stroke   Dehydration   Hyponatremia   Acute lower UTI   Above knee amputation of right lower extremity (HCC)   Atypical nevus of left lower leg   E. coli UTI (urinary tract infection)  1-COVID-19 virus infection: Completed a course of Paxlovid Continue to be on room air  2-CAP:ruled out.  CTA  negative for consolidation or pulmonary edema. She was initially treated with azithromycin and subsequently discontinue after low procalcitonin level.  3-Syncope: Troponin negative CTA negative for PE. Echo: Ejection fraction 60 to 65%.  Normal left ventricular function.  No regional wall motion abnormality.  Grade 1 diastolic dysfunction. Received hydration.  4-chronic diastolic heart failure: Hold IV fluids.  Hold lasix. Monitor volume status.    5-paroxysmal A-fib: On PRN metoprolol/  Hyponatremia: Resolved  Left knee amputation of right lower extremity: Will need safe disposition  UTI, urine culture positive for E. coli: Completed 5 days of Rocephin  History of TIA, Presume allergie to aspirin.   Atypical nevus of the left lower leg: Need dermatology referral Nausea;  Change thorazine to oral.  Having Bowel movement.   Disposition: Awaiting safe disposition. Per SW, daughter wants to take patient home, she is planning on admitting patient into ALF next week.   Nutrition Problem: Increased nutrient needs Etiology: acute illness    Signs/Symptoms: estimated needs    Interventions: Ensure Enlive (each supplement provides 350kcal and 20 grams of protein), MVI  Estimated body mass index is 23.42 kg/m as calculated from the following:   Height as of this encounter: 5' (1.524 m).   Weight as of this encounter: 54.4 kg.   DVT prophylaxis: Lovenox Code Status: DNR Family Communication:no family at bediside Disposition Plan:  Status is: Inpatient Remains inpatient appropriate because: admitted with covid, syncope.     Consultants:  None  Procedures:  ECHO  Antimicrobials:    Subjective: She is eating feeling well.   Objective: Vitals:   06/09/21 1219 06/09/21 2138 06/10/21 0500 06/10/21 0544  BP: 112/65 128/70 121/72   Pulse: 79 77 83   Resp: '18 18  17  '$ Temp: 97.7 F (36.5 C) 97.8 F (36.6 C) 97.9 F (36.6 C)   TempSrc: Oral Oral Oral   SpO2: 98% 98% 96%   Weight:   54.4 kg   Height:        Intake/Output Summary (Last 24 hours) at 06/10/2021 1442 Last data  filed at 06/10/2021 0631 Gross per 24 hour  Intake 480 ml  Output 1700 ml  Net -1220 ml    Filed Weights   06/08/21 0500 06/09/21 0441 06/10/21 0500  Weight: 54 kg 55 kg 54.4 kg    Examination:  General exam: NAD Respiratory system: CTA Cardiovascular system: S 1, S 2 RRR Gastrointestinal system: BS present  Central  nervous system: Alert, answer questions.  Extremities: S/P Right AKA  Data Reviewed: I have personally reviewed following labs and imaging studies  CBC: Recent Labs  Lab 06/04/21 0342 06/05/21 0349 06/06/21 0402 06/07/21 0409 06/09/21 0816  WBC 4.4 3.3* 4.7 4.3 5.5  NEUTROABS 2.5 1.3* 2.6 2.2  --   HGB 11.9* 11.1* 11.0* 11.6* 11.8*  HCT 37.0 34.1* 34.1* 35.7* 36.3  MCV 86.9 88.8 88.3 87.3 86.8  PLT 182 175 181 148* 846    Basic Metabolic Panel: Recent Labs  Lab 06/04/21 0342 06/05/21 0349 06/06/21 0402 06/07/21 0409 06/09/21 0816  NA 135 137 135 135 133*  K 3.7 4.1 3.9 4.1 4.1  CL 100 102 102 102 98  CO2 '30 30 28 27 28  '$ GLUCOSE 91 89 91 85 101*  BUN '11 13 9 '$ 7* 11  CREATININE 0.32* 0.37* 0.32* 0.32* 0.35*  CALCIUM 7.9* 7.8* 7.9* 8.2* 8.5*  MG 1.8 1.9 2.0 2.2  --   PHOS 2.6 3.4 2.4* 2.7  --     GFR: Estimated Creatinine Clearance: 32.9 mL/min (A) (by C-G formula based on SCr of 0.35 mg/dL (L)). Liver Function Tests: Recent Labs  Lab 06/04/21 0342 06/05/21 0349 06/06/21 0402 06/07/21 0409  AST '19 15 15 21  '$ ALT '19 16 15 13  '$ ALKPHOS 83 72 73 68  BILITOT 0.4 0.3 0.5 0.6  PROT 5.9* 5.4* 5.6* 5.8*  ALBUMIN 2.9* 2.5* 2.6* 2.7*    No results for input(s): LIPASE, AMYLASE in the last 168 hours. No results for input(s): AMMONIA in the last 168 hours. Coagulation Profile: No results for input(s): INR, PROTIME in the last 168 hours. Cardiac Enzymes: No results for input(s): CKTOTAL, CKMB, CKMBINDEX, TROPONINI in the last 168 hours.  BNP (last 3 results) No results for input(s): PROBNP in the last 8760 hours. HbA1C: No results for input(s): HGBA1C in the last 72 hours. CBG: No results for input(s): GLUCAP in the last 168 hours.  Lipid Profile: No results for input(s): CHOL, HDL, LDLCALC, TRIG, CHOLHDL, LDLDIRECT in the last 72 hours. Thyroid Function Tests: No results for input(s): TSH, T4TOTAL, FREET4, T3FREE, THYROIDAB in the last 72 hours. Anemia  Panel: No results for input(s): VITAMINB12, FOLATE, FERRITIN, TIBC, IRON, RETICCTPCT in the last 72 hours.  Sepsis Labs: No results for input(s): PROCALCITON, LATICACIDVEN in the last 168 hours.   Recent Results (from the past 240 hour(s))  Blood culture (routine x 2)     Status: None   Collection Time: 06/01/21 12:20 PM   Specimen: BLOOD  Result Value Ref Range Status   Specimen Description   Final    BLOOD RIGHT ANTECUBITAL Performed at [facility], [address], [place], [place] [address]    Special Requests   Final    BOTTLES DRAWN AEROBIC AND ANAEROBIC Blood Culture adequate volume Performed at [facility], [address] [place], [place] [address]    Culture   Final    NO GROWTH 5 DAYS Performed at [facility], [place] [address], [place],  [address]    Report Status 06/06/2021 FINAL  Final  Resp Panel by RT-PCR (Flu A&B, Covid) Nasopharyngeal Swab     Status: Abnormal   Collection Time: 06/01/21 12:27 PM   Specimen: Nasopharyngeal Swab; Nasopharyngeal(NP) swabs in vial transport medium  Result Value Ref Range Status   SARS Coronavirus 2 by RT PCR POSITIVE (A) NEGATIVE Final    Comment: (NOTE) SARS-CoV-2 target nucleic acids are DETECTED.  The SARS-CoV-2 RNA is generally detectable in upper respiratory specimens during the acute phase of infection. Positive results are indicative of the presence of the identified virus, but do not rule out bacterial infection or co-infection with other pathogens not detected by the test. Clinical correlation with patient history and other diagnostic information is necessary to determine patient infection status. The expected result is Negative.  Fact Sheet for Patients: EntrepreneurPulse.com.au  Fact Sheet for Healthcare Providers: IncredibleEmployment.be  This test is not yet approved or cleared by the [place] FDA and  has been  authorized for detection and/or diagnosis of SARS-CoV-2 by FDA under an Emergency Use Authorization (EUA).  This EUA will remain in effect (meaning this test can be used) for the duration of  the COVID-19 declaration under Section 564(b)(1) of the A ct, 21 U.S.C. section 360bbb-3(b)(1), unless the authorization is terminated or revoked sooner.     Influenza A by PCR NEGATIVE NEGATIVE Final   Influenza B by PCR NEGATIVE NEGATIVE Final    Comment: (NOTE) The Xpert Xpress SARS-CoV-2/FLU/RSV plus assay is intended as an aid in the diagnosis of influenza from Nasopharyngeal swab specimens and should not be used as a sole basis for treatment. Nasal washings and aspirates are unacceptable for Xpert Xpress SARS-CoV-2/FLU/RSV testing.  Fact Sheet for Patients: EntrepreneurPulse.com.au  Fact Sheet for Healthcare Providers: IncredibleEmployment.be  This test is not yet approved or cleared by the [place] FDA and has been authorized for detection and/or diagnosis of SARS-CoV-2 by FDA under an Emergency Use Authorization (EUA). This EUA will remain in effect (meaning this test can be used) for the duration of the COVID-19 declaration under Section 564(b)(1) of the Act, 21 U.S.C. section 360bbb-3(b)(1), unless the authorization is terminated or revoked.  Performed at [facility], [address], [place], [place] [address]   Blood culture (routine x 2)     Status: None   Collection Time: 06/01/21 12:49 PM   Specimen: BLOOD RIGHT HAND  Result Value Ref Range Status   Specimen Description   Final    BLOOD RIGHT HAND Performed at [facility], [address] [place], [place] [address]    Special Requests   Final    IN PEDIATRIC BOTTLE Blood Culture results may not be optimal due to an inadequate volume of blood received in culture bottles Performed at [facility], [address],  [place], [place] [address]    Culture   Final    NO GROWTH 5 DAYS Performed at [facility], [place] [address], [place], [place] [address]    Report Status 06/06/2021 FINAL  Final  Urine Culture     Status: Abnormal   Collection Time: 06/01/21  3:59 PM   Specimen: In/Out Cath Urine  Result Value Ref Range Status   Specimen Description   Final    IN/OUT CATH URINE Performed at [facility], [address] [place], [place] [address]    Special Requests   Final    NONE Performed at [facility], [address] [place], [place] [address]    Culture >=100,000 COLONIES/mL ESCHERICHIA COLI (  A)  Final   Report Status 06/03/2021 FINAL  Final   Organism ID, Bacteria ESCHERICHIA COLI (A)  Final      Susceptibility   Escherichia coli - MIC*    AMPICILLIN <=2 SENSITIVE Sensitive     CEFAZOLIN <=4 SENSITIVE Sensitive     CEFEPIME <=0.12 SENSITIVE Sensitive     CEFTRIAXONE <=0.25 SENSITIVE Sensitive     CIPROFLOXACIN <=0.25 SENSITIVE Sensitive     GENTAMICIN <=1 SENSITIVE Sensitive     IMIPENEM <=0.25 SENSITIVE Sensitive     NITROFURANTOIN <=16 SENSITIVE Sensitive     TRIMETH/SULFA <=20 SENSITIVE Sensitive     AMPICILLIN/SULBACTAM <=2 SENSITIVE Sensitive     PIP/TAZO <=4 SENSITIVE Sensitive     * >=100,000 COLONIES/mL ESCHERICHIA COLI          Radiology Studies: No results found.      Scheduled Meds:  brimonidine  1 drop Left Eye BID   enoxaparin (LOVENOX) injection  40 mg Subcutaneous Q24H   feeding supplement  237 mL Oral BID BM   levothyroxine  50 mcg Oral QAC breakfast   multivitamin with minerals  1 tablet Oral Daily   pantoprazole  40 mg Oral Daily   timolol  1 drop Both Eyes BID   Continuous Infusions:     LOS: 8 days    Time spent: 35 minutes.     Elmarie Shiley, MD [facility]   If 7PM-7AM, please contact night-coverage [email or web address]  06/10/2021, 2:42 PM

## 2021-06-10 NOTE — TOC Progression Note (Addendum)
Transition of Care [facility]) - Progression Note    Patient Details  Name: Stephanie Donovan MRN: [id number] Date of Birth: 1927-10-25  [facility]) CM/SW Contact  Ross Ludwig, [place] Phone Number: 06/10/2021, 1:34 PM  Clinical Narrative:     Updated clinicals and FL2 sent to SNFs awaiting bed offers.  2:15pm  CSW spoke to patient's daughter Stephanie Donovan [phone].  Per patient's daughter she has spoken to [facility] on [place] [address] and ALF has completed their assessment on her two weeks ago.  Per patient's daughter patient can go to ALF later in the week.  Daughter is requesting patient to discharge back home tomorrow if she is medically ready for discharge.  CSW updated attending physician and bedside nurse.  Per patient's daughter, she is not interested in [facility].  Patient's daughter also stated that patient's PCP already completed FL2.  Per patient's daughter she will need EMS transport back home.   Expected Discharge Plan: [place] Barriers to Discharge: No Barriers Identified  Expected Discharge Plan and Services Expected Discharge Plan: [place] Choice: [place] arrangements for the past 2 months: Single Family Home                                       Social Determinants of Health (SDOH) Interventions    Readmission Risk Interventions     View : No data to display.

## 2021-06-11 DIAGNOSIS — R55 Syncope and collapse: Secondary | ICD-10-CM | POA: Diagnosis not present

## 2021-06-11 MED ORDER — METOPROLOL SUCCINATE ER 25 MG PO TB24
12.5000 mg | ORAL_TABLET | Freq: Every day | ORAL | 0 refills | Status: AC | PRN
Start: 2021-06-11 — End: ?

## 2021-06-11 MED ORDER — ZINC SULFATE 220 (50 ZN) MG PO CAPS: 220.0000 mg | ORAL_CAPSULE | Freq: Every day | ORAL | Status: AC

## 2021-06-11 MED ORDER — ALBUTEROL SULFATE HFA 108 (90 BASE) MCG/ACT IN AERS: 2.0000 | INHALATION_SPRAY | Freq: Four times a day (QID) | RESPIRATORY_TRACT | 0 refills | Status: AC | PRN

## 2021-06-11 MED ORDER — PANTOPRAZOLE SODIUM 40 MG PO TBEC: 40.0000 mg | DELAYED_RELEASE_TABLET | Freq: Every day | ORAL | 0 refills | Status: AC

## 2021-06-11 MED ORDER — CHLORPROMAZINE HCL 10 MG PO TABS: 10.0000 mg | ORAL_TABLET | Freq: Three times a day (TID) | ORAL | 0 refills | Status: AC | PRN

## 2021-06-11 NOTE — Progress Notes (Signed)
AVS given to PTAR for transport and explained over the phone . Medications and follow up appointments have been explained with the pt's daughter verbalizing understanding.

## 2021-06-11 NOTE — TOC Transition Note (Signed)
Transition of Care [facility]) - CM/SW Discharge Note   Patient Details  Name: Stephanie Donovan MRN: [id number] Date of Birth: 1927/06/23  [facility]) CM/SW Contact:  Illene Regulus, LCSW Phone Number: 06/11/2021, 10:24 AM   Clinical Narrative:     PTAR called for pt to be transportation back home with daughter.   Final next level of care: [place] Barriers to Discharge: No Barriers Identified   Patient Goals and CMS Choice Patient states their goals for this hospitalization and ongoing recovery are:: To get better CMS Medicare.gov Compare Post Acute Care list provided to:: Patient Choice offered to / list presented to : Patient, Adult Children  Discharge Placement                       Discharge Plan and Services     Post Acute Care Choice: [place]                               Social Determinants of Health (SDOH) Interventions     Readmission Risk Interventions    06/11/2021   10:23 AM  Readmission Risk Prevention Plan  Transportation Screening Complete  Medication Review ([place]) Complete  PCP or Specialist appointment within 3-5 days of discharge Complete  HRI or [place] Complete  SW Recovery Care/Counseling Consult Complete  [place] Not Applicable

## 2021-06-11 NOTE — Discharge Summary (Signed)
Physician Discharge Summary   Patient: Stephanie Donovan MRN: [id number] DOB: 1927-09-11  Admit date:     06/01/2021  Discharge date: 06/11/21  Discharge Physician: Elmarie Shiley   PCP: Clovia Cuff, MD   Recommendations at discharge:    Monitor electrolytes.   Discharge Diagnoses: Principal Problem:   Syncope Active Problems:   CAP (community acquired pneumonia)   Paroxysmal A-fib ([place])   COVID-19 virus infection   Hypothyroidism   Chronic diastolic CHF (congestive heart failure) (HCC)   Glaucoma   Hx of TIA (transient ischemic attack) and stroke   Dehydration   Hyponatremia   Acute lower UTI   Above knee amputation of right lower extremity (HCC)   Atypical nevus of left lower leg   E. coli UTI (urinary tract infection)  Resolved Problems:   * No resolved hospital problems. *  Hospital Course: 88 year old with past medical history significant for dementia, hypothyroidism, hypertension, A-fib, status post right knee AKA secondary to infected hardware, history of syncope, recurring UTI, chronic back pain, TIA, constipation presenting with generalized weakness and fatigue and after a syncopal episode.  Patient presented to the ED complaining of cough, she lives with her daughter who tested positive for COVID the day of admission.  Patient at baseline is wheelchair-bound status post AKA.  Patient was noted to have a syncopal episode while sitting in her chair.  Resuscitation and rapidly improved without any intervention.  Patient reported poor oral intake.   Patient was found to have COVID, admitted for syncope episode.     Assessment and Plan:   1-COVID-19 virus infection: Completed a course of Paxlovid Continue to be on room air   2-CAP:ruled out.  CTA  negative for consolidation or pulmonary edema. She was initially treated with azithromycin and subsequently discontinue after low procalcitonin level.   3-Syncope: Troponin negative CTA negative for PE. Echo:  Ejection fraction 60 to 65%.  Normal left ventricular function.  No regional wall motion abnormality.  Grade 1 diastolic dysfunction. Received hydration.   4-chronic diastolic heart failure: Hold IV fluids.  Hold lasix. Monitor volume status.    5-paroxysmal A-fib: On PRN metoprolol/   Hyponatremia: Resolved   Left knee amputation of right lower extremity: Will need safe disposition   UTI, urine culture positive for E. coli: Completed 5 days of Rocephin   History of TIA, Presume allergie to aspirin.    Atypical nevus of the left lower leg: Need dermatology referral Nausea;  Tolerating diet.  Change thorazine to oral.  Having Bowel movement.    Disposition: Awaiting safe disposition. Per SW, daughter wants to take patient home, she is planning on admitting patient into ALF next week.    Nutrition Problem: Increased nutrient needs Etiology: acute illness           Consultants: None Procedures performed: None Disposition: Home Diet recommendation: [place] healthy  Discharge Diet Orders (From admission, onward)     Start     Ordered   06/11/21 0000  Diet - low sodium heart healthy        06/11/21 0952           Cardiac diet DISCHARGE MEDICATION: Allergies as of 06/11/2021       Reactions   Aspirin Palpitations, Other (See Comments)   Unspecified Mixed reactions   Penicillins Other (See Comments)   Lost hearing temporarily DID THE REACTION INVOLVE: Swelling of the face/tongue/throat, SOB, or low BP? No Sudden or severe rash/hives, skin peeling, or the inside of  the mouth or nose? No Did it require medical treatment? No When did it last happen?       If all above answers are "NO", may proceed with cephalosporin use.   Codeine Palpitations        Medication List     STOP taking these medications    cephALEXin 500 MG capsule Commonly known as: KEFLEX   oxyCODONE 5 MG immediate release tablet Commonly known as: Oxy IR/ROXICODONE       TAKE  these medications    albuterol 108 (90 Base) MCG/ACT inhaler Commonly known as: VENTOLIN HFA Inhale 2 puffs into the lungs every 6 (six) hours as needed for wheezing or shortness of breath.   brimonidine 0.1 % Soln Commonly known as: ALPHAGAN P Place 1 drop into the left eye in the morning and at bedtime.   chlorproMAZINE 10 MG tablet Commonly known as: THORAZINE Take 1 tablet (10 mg total) by mouth 3 (three) times daily as needed for vomiting or nausea.   ferrous sulfate 325 (65 FE) MG EC tablet Take 325 mg by mouth daily.   furosemide 20 MG tablet Commonly known as: LASIX Take 20 mg by mouth daily.   levothyroxine 50 MCG tablet Commonly known as: SYNTHROID Take 50 mcg by mouth daily before breakfast.   magnesium oxide 400 (241.3 Mg) MG tablet Commonly known as: MAG-OX Take 1 tablet (400 mg total) by mouth 2 (two) times daily. What changed: when to take this   metoprolol succinate 25 MG 24 hr tablet Commonly known as: TOPROL-XL Take 0.5 tablets (12.5 mg total) by mouth daily as needed (Give 12.5 mg if HR > 99 bpm). What changed:  how much to take Another medication with the same name was removed. Continue taking this medication, and follow the directions you see here.   Multiple Vitamin-Folic Acid Tabs Take 1 tablet by mouth daily. 0.4 mg   pantoprazole 40 MG tablet Commonly known as: PROTONIX Take 1 tablet (40 mg total) by mouth daily. Start taking on: Jun 12, 2021   timolol 0.25 % ophthalmic solution Commonly known as: TIMOPTIC Place 1 drop into both eyes 2 (two) times daily.   zinc sulfate 220 (50 Zn) MG capsule Take 1 capsule (220 mg total) by mouth daily.        Discharge Exam: Filed Weights   06/09/21 0441 06/10/21 0500 06/11/21 0500  Weight: 55 kg 54.4 kg 51 kg   General NAD Alert conversant.   Condition at discharge: stable  The results of significant diagnostics from this hospitalization (including imaging, microbiology, ancillary and  laboratory) are listed below for reference.   Imaging Studies: X-ray chest PA and lateral  Result Date: 06/01/2021 CLINICAL DATA:  Cough EXAM: CHEST - 2 VIEW COMPARISON:  06/01/2021 FINDINGS: Heart is normal size. Aortic atherosclerosis. No confluent opacities or effusions. No acute bony abnormality. IMPRESSION: No active cardiopulmonary disease. Electronically Signed   By: Rolm Baptise M.D.   On: 06/01/2021 20:34   CT Head Wo Contrast  Result Date: 06/01/2021 CLINICAL DATA:  Altered mental status, nontraumatic (Ped 0-17y) EXAM: CT HEAD WITHOUT CONTRAST TECHNIQUE: Contiguous axial images were obtained from the base of the skull through the vertex without intravenous contrast. RADIATION DOSE REDUCTION: This exam was performed according to the departmental dose-optimization program which includes automated exposure control, adjustment of the mA and/or kV according to patient size and/or use of iterative reconstruction technique. COMPARISON:  December 20, 2020. FINDINGS: Brain: No evidence of acute infarction, hemorrhage, hydrocephalus, extra-axial  collection or mass lesion/mass effect. Patchy white matter hypoattenuation, nonspecific compatible with chronic microvascular ischemic disease. Vascular: No hyperdense vessel identified. Calcific intracranial atherosclerosis. Skull: No acute fracture. Sinuses/Orbits: Clear visualized sinuses. No acute orbital findings. Other: No mastoid effusions. IMPRESSION: No evidence of acute intracranial abnormality. Electronically Signed   By: Margaretha Sheffield M.D.   On: 06/01/2021 13:09   CT Angio Chest Pulmonary Embolism (PE) W or WO Contrast  Result Date: 06/02/2021 CLINICAL DATA:  Generalized weakness, syncope, cough EXAM: CT ANGIOGRAPHY CHEST WITH CONTRAST TECHNIQUE: Multidetector CT imaging of the chest was performed using the standard protocol during bolus administration of intravenous contrast. Multiplanar CT image reconstructions and MIPs were obtained to  evaluate the vascular anatomy. RADIATION DOSE REDUCTION: This exam was performed according to the departmental dose-optimization program which includes automated exposure control, adjustment of the mA and/or kV according to patient size and/or use of iterative reconstruction technique. CONTRAST:  91m OMNIPAQUE IOHEXOL 350 MG/ML SOLN COMPARISON:  Multiple prior chest radiographs most recently dated 1 day prior. No prior CT of the chest available. CT abdomen/pelvis 03/26/2013 FINDINGS: Cardiovascular: There is adequate opacification of the pulmonary arteries to the segmental level. There is no evidence of pulmonary embolism. The heart is not enlarged. There is no pericardial effusion. There is calcified atherosclerotic plaque throughout the thoracic aorta. There are extensive coronary artery calcifications and mild mitral annular calcifications. Mediastinum/Nodes: Thyroid is enlarged with a heterogeneous nodule in the left measuring up to 3.7 cm. There is no mediastinal, hilar, or axillary lymphadenopathy. Lungs/Pleura: Trachea and central airways are patent. There is mild dependent subsegmental atelectasis in the lung bases. There is no focal consolidation or pulmonary edema. There is no pleural effusion or pneumothorax. There are no suspicious nodules. Upper Abdomen: A few small hypodense lesions in the liver are too small to definitively characterize but likely reflect benign cysts, for which no specific imaging follow-up is required. There is calcification along the posterior margin of 1 of the cysts in the right hepatic lobe. There is cholelithiasis without evidence of acute cholecystitis. There is mild bilateral hydronephrosis, incompletely imaged. Musculoskeletal: There are compression deformities of the T4, T5, T8, T10, T11, T12, and L1 vertebral bodies with mild bony retropulsion at T12 and L1. Fractures appear chronic, with no definite evidence of acute injury. There is no suspicious osseous lesion. Review  of the MIP images confirms the above findings. IMPRESSION: 1. No evidence of pulmonary embolism or other acute cardiopulmonary pathology. 2. Multiple chronic appearing vertebral body compression fractures with no definite acute fracture. 3. Cholelithiasis without evidence of acute cholecystitis. 4. Mild bilateral hydronephrosis, incompletely evaluated. Consider CT abdomen/pelvis or renal ultrasound as indicated. 5. Enlarged left thyroid lobe with a 3.7 cm nodule. Thyroid ultrasound may be considered as indicated given patient age. Aortic Atherosclerosis (ICD10-I70.0). Electronically Signed   By: PValetta MoleM.D.   On: 06/02/2021 13:04   DG Chest Portable 1 View  Result Date: 06/01/2021 CLINICAL DATA:  Shortness of breath.  COVID exposure cough. EXAM: PORTABLE CHEST 1 VIEW COMPARISON:  03/24/2021 FINDINGS: Degradation secondary to overlying wires, leads, and a external pacer/defibrillator. Right shoulder arthroplasty. Osteopenia. Cardiomegaly accentuated by AP portable technique. No pleural effusion or pneumothorax. Mild right infrahilar scarring and volume loss, similar. Equivocal increased density along the left hemidiaphragm laterally. No congestive failure. IMPRESSION: Limited artifact degraded AP portable radiograph. Cannot exclude developing left base airspace disease. Consider repeat radiograph after removal of wires and leads, ideally PA and lateral technique. Cardiomegaly without congestive failure.  Electronically Signed   By: Abigail Miyamoto M.D.   On: 06/01/2021 13:09   ECHOCARDIOGRAM COMPLETE  Result Date: 06/02/2021    ECHOCARDIOGRAM REPORT   Patient Name:   ANAGHA LOSEKE Date of Exam: 06/02/2021 Medical Rec #:  [id number]           Height:       60.0 in Accession #:    [id number]          Weight:       140.0 lb Date of Birth:  1927-12-19           BSA:          1.604 m Patient Age:    23 years            BP:           119/67 mmHg Patient Gender: F                   HR:           88 bpm. Exam  Location:  Inpatient Procedure: 2D Echo, Cardiac Doppler and Color Doppler Indications:    Techinically difficult due to patients position and respiratory                 interference.  History:        Patient has prior history of Echocardiogram examinations, most                 recent 07/23/2020. CHF.  Sonographer:    Jefferey Pica Referring Phys: [place]  1. Left ventricular ejection fraction, by estimation, is 60 to 65%. The left ventricle has normal function. The left ventricle has no regional wall motion abnormalities. Left ventricular diastolic parameters are consistent with Grade I diastolic dysfunction (impaired relaxation).  2. Right ventricular systolic function is normal. The right ventricular size is normal.  3. The mitral valve is grossly normal. No evidence of mitral valve regurgitation.  4. The aortic valve was not well visualized. Aortic valve regurgitation is not visualized.  5. Aortic no significant aortic aneurysm.  6. Not well visualized. Comparison(s): No significant change from prior study. FINDINGS  Left Ventricle: Left ventricular ejection fraction, by estimation, is 60 to 65%. The left ventricle has normal function. The left ventricle has no regional wall motion abnormalities. The left ventricular internal cavity size was normal in size. There is  no left ventricular hypertrophy. Left ventricular diastolic parameters are consistent with Grade I diastolic dysfunction (impaired relaxation). Right Ventricle: The right ventricular size is normal. Right ventricular systolic function is normal. Left Atrium: Left atrial size was normal in size. Right Atrium: Right atrial size was normal in size. Pericardium: There is no evidence of pericardial effusion. Presence of epicardial fat layer. Mitral Valve: The mitral valve is grossly normal. No evidence of mitral valve regurgitation. Tricuspid Valve: The tricuspid valve is grossly normal. Tricuspid valve regurgitation is not  demonstrated. Aortic Valve: The aortic valve was not well visualized. Aortic valve regurgitation is not visualized. Aortic valve peak gradient measures 8.2 mmHg. Pulmonic Valve: The pulmonic valve was not well visualized. Pulmonic valve regurgitation is not visualized. Aorta: No significant aortic aneurysm. Venous: Not well visualized. IAS/Shunts: No atrial level shunt detected by color flow Doppler.  LEFT VENTRICLE PLAX 2D LVIDd:         4.10 cm   Diastology LVIDs:         2.40 cm   LV e' lateral:   7.76  cm/s LV PW:         1.10 cm   LV E/e' lateral: 9.9 LV IVS:        1.00 cm LVOT diam:     1.80 cm LV SV:         54 LV SV Index:   34 LVOT Area:     2.54 cm  LEFT ATRIUM             Index LA diam:        3.00 cm 1.87 cm/m LA Vol (A2C):   42.8 ml 26.68 ml/m LA Vol (A4C):   28.6 ml 17.83 ml/m LA Biplane Vol: 35.1 ml 21.88 ml/m  AORTIC VALVE                 PULMONIC VALVE AV Area (Vmax): 2.08 cm     PV Vmax:       0.62 m/s AV Vmax:        143.00 cm/s  PV Peak grad:  1.5 mmHg AV Peak Grad:   8.2 mmHg LVOT Vmax:      117.00 cm/s LVOT Vmean:     71.100 cm/s LVOT VTI:       0.212 m  AORTA Ao Root diam: 3.30 cm Ao Asc diam:  3.30 cm MITRAL VALVE MV Area (PHT): 2.62 cm     SHUNTS MV Decel Time: 290 msec     Systemic VTI:  0.21 m MV E velocity: 76.70 cm/s   Systemic Diam: 1.80 cm MV A velocity: 102.00 cm/s MV E/A ratio:  0.75 Landscape architect signed by Phineas Inches Signature Date/Time: 06/02/2021/12:35:47 PM    Final     Microbiology: Results for orders placed or performed during the hospital encounter of 06/01/21  Blood culture (routine x 2)     Status: None   Collection Time: 06/01/21 12:20 PM   Specimen: BLOOD  Result Value Ref Range Status   Specimen Description   Final    BLOOD RIGHT ANTECUBITAL Performed at [facility], [address], [place], [place] [address]    Special Requests   Final    BOTTLES DRAWN AEROBIC AND ANAEROBIC Blood Culture adequate volume Performed at  [facility], [address], [place], [place] [address]    Culture   Final    NO GROWTH 5 DAYS Performed at [facility], [place] [address], [place], [place] [address]    Report Status 06/06/2021 FINAL  Final  Resp Panel by RT-PCR (Flu A&B, Covid) Nasopharyngeal Swab     Status: Abnormal   Collection Time: 06/01/21 12:27 PM   Specimen: Nasopharyngeal Swab; Nasopharyngeal(NP) swabs in vial transport medium  Result Value Ref Range Status   SARS Coronavirus 2 by RT PCR POSITIVE (A) NEGATIVE Final    Comment: (NOTE) SARS-CoV-2 target nucleic acids are DETECTED.  The SARS-CoV-2 RNA is generally detectable in upper respiratory specimens during the acute phase of infection. Positive results are indicative of the presence of the identified virus, but do not rule out bacterial infection or co-infection with other pathogens not detected by the test. Clinical correlation with patient history and other diagnostic information is necessary to determine patient infection status. The expected result is Negative.  Fact Sheet for Patients: EntrepreneurPulse.com.au  Fact Sheet for Healthcare Providers: IncredibleEmployment.be  This test is not yet approved or cleared by the [place] FDA and  has been authorized for detection and/or diagnosis of SARS-CoV-2 by FDA under an Emergency Use Authorization (EUA).  This EUA will remain in effect (meaning this test can be used) for the duration of  the COVID-19 declaration under Section 564(b)(1) of the A ct, 21 U.S.C. section 360bbb-3(b)(1), unless the authorization is terminated or revoked sooner.     Influenza A by PCR NEGATIVE NEGATIVE Final   Influenza B by PCR NEGATIVE NEGATIVE Final    Comment: (NOTE) The Xpert Xpress SARS-CoV-2/FLU/RSV plus assay is intended as an aid in the diagnosis of influenza from Nasopharyngeal swab specimens and should not be used as a sole basis for  treatment. Nasal washings and aspirates are unacceptable for Xpert Xpress SARS-CoV-2/FLU/RSV testing.  Fact Sheet for Patients: EntrepreneurPulse.com.au  Fact Sheet for Healthcare Providers: IncredibleEmployment.be  This test is not yet approved or cleared by the [place] FDA and has been authorized for detection and/or diagnosis of SARS-CoV-2 by FDA under an Emergency Use Authorization (EUA). This EUA will remain in effect (meaning this test can be used) for the duration of the COVID-19 declaration under Section 564(b)(1) of the Act, 21 U.S.C. section 360bbb-3(b)(1), unless the authorization is terminated or revoked.  Performed at [facility], [address] [place], [place] [address]   Blood culture (routine x 2)     Status: None   Collection Time: 06/01/21 12:49 PM   Specimen: BLOOD RIGHT HAND  Result Value Ref Range Status   Specimen Description   Final    BLOOD RIGHT HAND Performed at [facility], [address] [place], [place] [address]    Special Requests   Final    IN PEDIATRIC BOTTLE Blood Culture results may not be optimal due to an inadequate volume of blood received in culture bottles Performed at [facility], [address], [place], [place] [address]    Culture   Final    NO GROWTH 5 DAYS Performed at [facility], [place] [address], [place], [place] [address]    Report Status 06/06/2021 FINAL  Final  Urine Culture     Status: Abnormal   Collection Time: 06/01/21  3:59 PM   Specimen: In/Out Cath Urine  Result Value Ref Range Status   Specimen Description   Final    IN/OUT CATH URINE Performed at [facility], [address], [place], [place] [address]    Special Requests   Final    NONE Performed at [facility], [address], [place], [place] [address]    Culture >=100,000 COLONIES/mL ESCHERICHIA COLI  (A)  Final   Report Status 06/03/2021 FINAL  Final   Organism ID, Bacteria ESCHERICHIA COLI (A)  Final      Susceptibility   Escherichia coli - MIC*    AMPICILLIN <=2 SENSITIVE Sensitive     CEFAZOLIN <=4 SENSITIVE Sensitive     CEFEPIME <=0.12 SENSITIVE Sensitive     CEFTRIAXONE <=0.25 SENSITIVE Sensitive     CIPROFLOXACIN <=0.25 SENSITIVE Sensitive     GENTAMICIN <=1 SENSITIVE Sensitive     IMIPENEM <=0.25 SENSITIVE Sensitive     NITROFURANTOIN <=16 SENSITIVE Sensitive     TRIMETH/SULFA <=20 SENSITIVE Sensitive     AMPICILLIN/SULBACTAM <=2 SENSITIVE Sensitive     PIP/TAZO <=4 SENSITIVE Sensitive     * >=100,000 COLONIES/mL ESCHERICHIA COLI    Labs: CBC: Recent Labs  Lab 06/05/21 0349 06/06/21 0402 06/07/21 0409 06/09/21 0816  WBC 3.3* 4.7 4.3 5.5  NEUTROABS 1.3* 2.6 2.2  --   HGB 11.1* 11.0* 11.6* 11.8*  HCT 34.1* 34.1* 35.7* 36.3  MCV 88.8  88.3 87.3 86.8  PLT 175 181 148* 615   Basic Metabolic Panel: Recent Labs  Lab 06/05/21 0349 06/06/21 0402 06/07/21 0409 06/09/21 0816  NA 137 135 135 133*  K 4.1 3.9 4.1 4.1  CL 102 102 102 98  CO2 '30 28 27 28  '$ GLUCOSE 89 91 85 101*  BUN 13 9 7* 11  CREATININE 0.37* 0.32* 0.32* 0.35*  CALCIUM 7.8* 7.9* 8.2* 8.5*  MG 1.9 2.0 2.2  --   PHOS 3.4 2.4* 2.7  --    Liver Function Tests: Recent Labs  Lab 06/05/21 0349 06/06/21 0402 06/07/21 0409  AST '15 15 21  '$ ALT '16 15 13  '$ ALKPHOS 72 73 68  BILITOT 0.3 0.5 0.6  PROT 5.4* 5.6* 5.8*  ALBUMIN 2.5* 2.6* 2.7*   CBG: No results for input(s): GLUCAP in the last 168 hours.  Discharge time spent: greater than 30 minutes.  Signed: Elmarie Shiley, MD [facility] 06/11/2021

## 2021-06-12 ENCOUNTER — Encounter (HOSPITAL_COMMUNITY): Payer: Self-pay

## 2021-06-12 ENCOUNTER — Other Ambulatory Visit: Payer: Self-pay

## 2021-06-12 ENCOUNTER — Emergency Department (HOSPITAL_COMMUNITY)
Admission: EM | Admit: 2021-06-12 | Discharge: 2021-06-13 | Disposition: A | Discharge status: Home / Self Care | Payer: Medicare Other | Attending: Emergency Medicine | Admitting: Emergency Medicine

## 2021-06-12 ENCOUNTER — Emergency Department (HOSPITAL_COMMUNITY): Payer: Medicare Other

## 2021-06-12 DIAGNOSIS — I10 Essential (primary) hypertension: Secondary | ICD-10-CM | POA: Diagnosis not present

## 2021-06-12 DIAGNOSIS — E039 Hypothyroidism, unspecified: Secondary | ICD-10-CM | POA: Insufficient documentation

## 2021-06-12 DIAGNOSIS — R051 Acute cough: Secondary | ICD-10-CM | POA: Diagnosis not present

## 2021-06-12 DIAGNOSIS — I4891 Unspecified atrial fibrillation: Secondary | ICD-10-CM | POA: Insufficient documentation

## 2021-06-12 DIAGNOSIS — Z79899 Other long term (current) drug therapy: Secondary | ICD-10-CM | POA: Insufficient documentation

## 2021-06-12 DIAGNOSIS — F0390 Unspecified dementia without behavioral disturbance: Secondary | ICD-10-CM | POA: Diagnosis not present

## 2021-06-12 DIAGNOSIS — R059 Cough, unspecified: Secondary | ICD-10-CM | POA: Diagnosis present

## 2021-06-12 LAB — CBC WITH DIFFERENTIAL/PLATELET
Abs Immature Granulocytes: 0.08 10*3/uL — ABNORMAL HIGH (ref 0.00–0.07)
Basophils Absolute: 0.1 10*3/uL (ref 0.0–0.1)
Basophils Relative: 1 %
Eosinophils Absolute: 0.1 10*3/uL (ref 0.0–0.5)
Eosinophils Relative: 1 %
HCT: 36.5 % (ref 36.0–46.0)
Hemoglobin: 12 g/dL (ref 12.0–15.0)
Immature Granulocytes: 1 %
Lymphocytes Relative: 8 %
Lymphs Abs: 0.7 10*3/uL (ref 0.7–4.0)
MCH: 28.4 pg (ref 26.0–34.0)
MCHC: 32.9 g/dL (ref 30.0–36.0)
MCV: 86.3 fL (ref 80.0–100.0)
Monocytes Absolute: 1.1 10*3/uL — ABNORMAL HIGH (ref 0.1–1.0)
Monocytes Relative: 12 %
Neutro Abs: 7.1 10*3/uL (ref 1.7–7.7)
Neutrophils Relative %: 77 %
Platelets: 294 10*3/uL (ref 150–400)
RBC: 4.23 MIL/uL (ref 3.87–5.11)
RDW: 13.7 % (ref 11.5–15.5)
WBC: 9.1 10*3/uL (ref 4.0–10.5)
nRBC: 0 % (ref 0.0–0.2)

## 2021-06-12 LAB — MAGNESIUM: Magnesium: 1.8 mg/dL (ref 1.7–2.4)

## 2021-06-12 LAB — BASIC METABOLIC PANEL
Anion gap: 8 (ref 5–15)
BUN: 11 mg/dL (ref 8–23)
CO2: 27 mmol/L (ref 22–32)
Calcium: 8.6 mg/dL — ABNORMAL LOW (ref 8.9–10.3)
Chloride: 93 mmol/L — ABNORMAL LOW (ref 98–111)
Creatinine, Ser: 0.41 mg/dL — ABNORMAL LOW (ref 0.44–1.00)
GFR, Estimated: >60 mL/min (ref >60)
Glucose, Bld: 130 mg/dL — ABNORMAL HIGH (ref 70–99)
Potassium: 4.2 mmol/L (ref 3.5–5.1)
Sodium: 128 mmol/L — ABNORMAL LOW (ref 135–145)

## 2021-06-12 MED ORDER — DOXYCYCLINE HYCLATE 100 MG PO TABS
100.0000 mg | ORAL_TABLET | Freq: Once | ORAL | Status: AC
  Administered 2021-06-12: 100 mg via ORAL
  Filled 2021-06-12: qty 1

## 2021-06-12 NOTE — ED Provider Notes (Signed)
[facility] DEPT Provider Note   CSN: [id number] Arrival date & time: 06/12/21  2117     History {Add pertinent medical, surgical, social history, OB history to HPI:1} Chief Complaint  Patient presents with   Covid Positive    Stephanie Donovan is a 88 y.o. female.  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  Patient is a poor historian but states that she has no complaints this time.  She states she does not know why she is here.  She is not endorsing chest pain shortness of breath stomach pains nausea vomiting diarrhea has no other complaints.  I spoke to patient's daughter who informs me that over the last 2 days patient has been coughing more and she feels that she seems more lethargic, she states that she still eating and drinking, she notes that patient tested positive for COVID while she has been home and that the patient should not have been discharged from the hospital yesterday as she needs to be further evaluated due to her [place].  She states that patient is at her normal baseline.  Reviewed patient's was admitted on 05/11 for COVID and was discharged on the 21st, patient was treated with Paxlovid, she was recently started on azithromycin but was discontinued due to low prolactin, there is concern for possible PE CT of chest was unremarkable.  Patient's COVID test was positive on 05/11, she had a Legionella pneumonia as well as Streptococcus pneumonia.  Home Medications Prior to Admission medications   Medication Sig Start Date End Date Taking? Authorizing Provider  albuterol (VENTOLIN HFA) 108 (90 Base) MCG/ACT inhaler Inhale 2 puffs into the lungs every 6 (six) hours as needed for wheezing or shortness of breath. 06/11/21    Regalado, Belkys A, MD  brimonidine (ALPHAGAN P) 0.1 % SOLN Place 1 drop into the left eye in the morning and at bedtime.    [provider]  chlorproMAZINE (THORAZINE) 10 MG tablet Take 1 tablet (10 mg total) by mouth 3 (three) times daily as needed for vomiting or nausea. 06/11/21   Regalado, Belkys A, MD  ferrous sulfate 325 (65 FE) MG EC tablet Take 325 mg by mouth daily.    [provider]  furosemide (LASIX) 20 MG tablet Take 20 mg by mouth daily.    [provider]  levothyroxine (SYNTHROID) 50 MCG tablet Take 50 mcg by mouth daily before breakfast.    [provider]  magnesium oxide (MAG-OX) 400 (241.3 Mg) MG tablet Take 1 tablet (400 mg total) by mouth 2 (two) times daily. Patient taking differently: Take 400 mg by mouth daily. 11/30/19   Regalado, Belkys A, MD  metoprolol succinate (TOPROL-XL) 25 MG 24 hr tablet Take 0.5 tablets (12.5 mg total) by mouth daily as needed (Give 12.5 mg if HR > 99 bpm). 06/11/21   Regalado, Belkys A, MD  Multiple Vitamin-Folic Acid TABS Take 1 tablet by mouth daily. 0.4 mg    [provider]  pantoprazole (PROTONIX) 40 MG tablet Take 1 tablet (40 mg total) by mouth daily. 06/12/21   Regalado, Belkys A, MD  timolol (TIMOPTIC) 0.25 % ophthalmic solution Place 1 drop into both eyes 2 (two) times daily. 12/22/19   [provider]  zinc sulfate 220 (50 Zn) MG capsule Take 1 capsule (220  mg total) by mouth daily. 06/11/21   Regalado, Belkys A, MD      Allergies    Aspirin, Penicillins, and Codeine    Review of Systems   Review of Systems  Unable to perform ROS: Mental status change   Physical Exam Updated Vital Signs BP (!) 132/103   Pulse 81   Temp 98.2 F (36.8 C) (Oral)   Resp 17   SpO2 94%  Physical Exam Vitals and nursing note reviewed.  Constitutional:      General: She is not in acute distress.    Appearance: She is not ill-appearing.  HENT:     Head: Normocephalic and atraumatic.     Nose:  No congestion.  Eyes:     Conjunctiva/sclera: Conjunctivae normal.  Cardiovascular:     Rate and Rhythm: Normal rate and regular rhythm.     Pulses: Normal pulses.     Heart sounds: No murmur heard.   No friction rub. No gallop.  Pulmonary:     Effort: No respiratory distress.     Breath sounds: No wheezing, rhonchi or rales.     Comments: No evidence of respiratory distress nontachypneic nonhypoxic, no assessor muscle usage, able speak in full sentences, she has some noted Rales in the left lower lung base, no wheezing stridor or rhonchi present. Abdominal:     Palpations: Abdomen is soft.     Tenderness: There is no abdominal tenderness. There is no right CVA tenderness or left CVA tenderness.  Musculoskeletal:     Right lower leg: No edema.     Left lower leg: No edema.  Skin:    General: Skin is warm and dry.  Neurological:     Mental Status: She is alert.  Psychiatric:        Mood and Affect: Mood normal.    ED Results / Procedures / Treatments   Labs (all labs ordered are listed, but only abnormal results are displayed) Labs Reviewed  CBC WITH DIFFERENTIAL/PLATELET - Abnormal; Notable for the following components:      Result Value   Monocytes Absolute 1.1 (*)    Abs Immature Granulocytes 0.08 (*)    All other components within normal limits  BASIC METABOLIC PANEL - Abnormal; Notable for the following components:   Sodium 128 (*)    Chloride 93 (*)    Glucose, Bld 130 (*)    Creatinine, Ser 0.41 (*)    Calcium 8.6 (*)    All other components within normal limits  MAGNESIUM  URINALYSIS, ROUTINE W REFLEX MICROSCOPIC    EKG None  Radiology DG Chest Portable 1 View  Result Date: 06/12/2021 CLINICAL DATA:  COVID positive EXAM: PORTABLE CHEST 1 VIEW COMPARISON:  06/01/2021 FINDINGS: Right shoulder replacement. Low lung volumes with patchy atelectasis or possible mild infiltrate left base. Stable cardiomediastinal silhouette with aortic atherosclerosis. No  pneumothorax IMPRESSION: Patchy atelectasis or minimal infiltrate left lung base Electronically Signed   By: Donavan Foil M.D.   On: 06/12/2021 22:48    Procedures Procedures  {Document cardiac monitor, telemetry assessment procedure when appropriate:1}  Medications Ordered in ED Medications - No data to display  ED Course/ Medical Decision Making/ A&P                           Medical Decision Making Amount and/or Complexity of Data Reviewed Labs: ordered. Radiology: ordered.   This patient presents to the ED for concern of COVID, this involves an  extensive number of treatment options, and is a complaint that carries with it a high risk of complications and morbidity.  The differential diagnosis includes pneumonia, sepsis, metabolic derailments    Additional history obtained:  Additional history obtained from daughter External records from outside source obtained and reviewed including previous discharge summary   Co morbidities that complicate the patient evaluation  CVA, CHF, hypertension  Social Determinants of Health:  Dementia, geriatric nonambulatory due to below the knee amputation.    Lab Tests:  I Ordered, and personally interpreted labs.  The pertinent results include: CBC unremarkable, BMP shows sodium 128, chloride 93, glucose 130 creatinine 0.41 calcium 8.6, magnesium 1.8,   Imaging Studies ordered:  I ordered imaging studies including chest x-ray I independently visualized and interpreted imaging which showed possible infiltrates on the left lower lung base I agree with the radiologist interpretation   Cardiac Monitoring:  The patient was maintained on a cardiac monitor.  I personally viewed and interpreted the cardiac monitored which showed an underlying rhythm of: ***   Medicines ordered and prescription drug management:  I ordered medication including *** I have reviewed the patients home medicines and have made adjustments as  needed  Critical Interventions:  ***   Reevaluation:  Presents with concerns of a cough and testing positive for COVID, she appears to be resting calmly showing no acute signs distress, she is nontachypneic nonhypoxic, she had noted Rales in left lower lung base, vital signs remained stable.  Triage obtain basic lab work-up and chest x-ray we will continue to monitor.    Consultations Obtained:  I requested consultation with the ***,  and discussed lab and imaging findings as well as pertinent plan - they recommend: ***    Test Considered:  ***    Rule out ****    Dispostion and problem list  After consideration of the diagnostic results and the patients response to treatment, I feel that the patent would benefit from ***.        {Document critical care time when appropriate:1} {Document review of labs and clinical decision tools ie heart score, Chads2Vasc2 etc:1}  {Document your independent review of radiology images, and any outside records:1} {Document your discussion with family members, caretakers, and with consultants:1} {Document social determinants of health affecting pt's care:1} {Document your decision making why or why not admission, treatments were needed:1} Final Clinical Impression(s) / ED Diagnoses Final diagnoses:  None    Rx / DC Orders ED Discharge Orders     None

## 2021-06-12 NOTE — ED Notes (Addendum)
Pt BIB EMS from home, seen yesterday for same. Pt's daughter thought pt should be reassessed in ED today due to spiking a fever at home. Pt afebrile with EMS and upon triage, respirations even and unlabored.

## 2021-06-13 LAB — URINALYSIS, ROUTINE W REFLEX MICROSCOPIC
Bacteria, UA: NONE SEEN
Bilirubin Urine: NEGATIVE
Glucose, UA: NEGATIVE mg/dL
Hgb urine dipstick: NEGATIVE
Ketones, ur: NEGATIVE mg/dL
Nitrite: NEGATIVE
Protein, ur: NEGATIVE mg/dL
Specific Gravity, Urine: 1.018 (ref 1.005–1.030)
pH: 6 (ref 5.0–8.0)

## 2021-06-13 MED ORDER — DOXYCYCLINE HYCLATE 100 MG PO CAPS: 100.0000 mg | ORAL_CAPSULE | Freq: Two times a day (BID) | ORAL | 0 refills | Status: AC

## 2021-06-13 NOTE — Discharge Instructions (Signed)
Lab work was all reassuring, her x-ray shows possible pneumonia in the left lower lung, I have started her on antibiotics please take as prescribed.  She was diagnosed with COVID on her admission at 5/11, and she was treated with Paxlovid the antiviral treatment.  It is possible that her COVID test will remain positive up to 90 days.  Since has been over 5 days of her positive COVID test she does not need to be quarantine.  Please follow-up with her PCP as needed.  Come back to the emergency department if you develop chest pain, shortness of breath, severe abdominal pain, uncontrolled nausea, vomiting, diarrhea.

## 2021-06-13 NOTE — ED Notes (Addendum)
[facility] contacted, pt placed on transport list.

## 2021-07-26 ENCOUNTER — Emergency Department (HOSPITAL_COMMUNITY)
Admission: EM | Admit: 2021-07-26 | Discharge: 2021-07-26 | Disposition: A | Discharge status: Other Acute Inpatient Hospital | Payer: Medicare Other | Attending: Emergency Medicine | Admitting: Emergency Medicine

## 2021-07-26 DIAGNOSIS — X58XXXA Exposure to other specified factors, initial encounter: Secondary | ICD-10-CM | POA: Diagnosis not present

## 2021-07-26 DIAGNOSIS — F0390 Unspecified dementia without behavioral disturbance: Secondary | ICD-10-CM | POA: Diagnosis not present

## 2021-07-26 DIAGNOSIS — Z89611 Acquired absence of right leg above knee: Secondary | ICD-10-CM | POA: Insufficient documentation

## 2021-07-26 DIAGNOSIS — S8992XA Unspecified injury of left lower leg, initial encounter: Secondary | ICD-10-CM | POA: Diagnosis present

## 2021-07-26 DIAGNOSIS — S81812A Laceration without foreign body, left lower leg, initial encounter: Secondary | ICD-10-CM | POA: Diagnosis not present

## 2021-07-26 MED ORDER — LIDOCAINE-EPINEPHRINE 2 %-1:100000 IJ SOLN
20.0000 mL | Freq: Once | INTRAMUSCULAR | Status: AC
  Administered 2021-07-26: 20 mL
  Filled 2021-07-26: qty 1

## 2021-07-26 MED ORDER — TETANUS-DIPHTH-ACELL PERTUSSIS 5-2.5-18.5 LF-MCG/0.5 IM SUSY: 0.5000 mL | PREFILLED_SYRINGE | Freq: Once | INTRAMUSCULAR | Status: DC

## 2021-07-26 NOTE — ED Triage Notes (Signed)
Ems brings pt in from [facility] for left leg laceration. Unknown cause, unknown time. Bleeding controlled.

## 2021-07-26 NOTE — ED Provider Notes (Signed)
[facility] DEPT Provider Note   CSN: [id number] Arrival date & time: 07/26/21  1508     History  Chief Complaint  Patient presents with   Laceration    Stephanie Donovan is a 88 y.o. female history of dementia here presenting with laceration back of the calf.  Patient has right above-the-knee amputation is bedbound at baseline.  Patient resides at [facility] currently and she was brought in because they noticed a laceration behind the left leg.  She cannot tell me how she got the laceration and she is not ambulatory at baseline.  The history is provided by the patient.       Home Medications Prior to Admission medications   Medication Sig Start Date End Date Taking? Authorizing Provider  albuterol (VENTOLIN HFA) 108 (90 Base) MCG/ACT inhaler Inhale 2 puffs into the lungs every 6 (six) hours as needed for wheezing or shortness of breath. 06/11/21   Regalado, Belkys A, MD  brimonidine (ALPHAGAN P) 0.1 % SOLN Place 1 drop into the left eye in the morning and at bedtime.    [provider]  chlorproMAZINE (THORAZINE) 10 MG tablet Take 1 tablet (10 mg total) by mouth 3 (three) times daily as needed for vomiting or nausea. 06/11/21   Regalado, Belkys A, MD  ferrous sulfate 325 (65 FE) MG EC tablet Take 325 mg by mouth daily.    [provider]  furosemide (LASIX) 20 MG tablet Take 20 mg by mouth daily.    [provider]  levothyroxine (SYNTHROID) 50 MCG tablet Take 50 mcg by mouth daily before breakfast.    [provider]  magnesium oxide (MAG-OX) 400 (241.3 Mg) MG tablet Take 1 tablet (400 mg total) by mouth 2 (two) times daily. Patient taking differently: Take 400 mg by mouth daily. 11/30/19   Regalado, Belkys A, MD  metoprolol succinate (TOPROL-XL) 25 MG 24 hr tablet Take 0.5 tablets (12.5 mg total) by mouth daily as needed (Give 12.5 mg if HR > 99 bpm). 06/11/21   Regalado, Belkys A, MD  Multiple Vitamin-Folic Acid TABS  Take 1 tablet by mouth daily. 0.4 mg    [provider]  pantoprazole (PROTONIX) 40 MG tablet Take 1 tablet (40 mg total) by mouth daily. 06/12/21   Regalado, Belkys A, MD  timolol (TIMOPTIC) 0.25 % ophthalmic solution Place 1 drop into both eyes 2 (two) times daily. 12/22/19   [provider]  zinc sulfate 220 (50 Zn) MG capsule Take 1 capsule (220 mg total) by mouth daily. 06/11/21   Regalado, Belkys A, MD      Allergies    Aspirin, Penicillins, and Codeine    Review of Systems   Review of Systems  Skin:  Positive for wound.  All other systems reviewed and are negative.   Physical Exam Updated Vital Signs BP (!) 154/93   Pulse 84   Temp 97.9 F (36.6 C) (Oral)   Resp 18   SpO2 97%  Physical Exam Vitals and nursing note reviewed.  Constitutional:      Comments: Demented  HENT:     Head: Normocephalic.     Nose: Nose normal.     Mouth/Throat:     Mouth: Mucous membranes are moist.  Eyes:     Pupils: Pupils are equal, round, and reactive to light.  Cardiovascular:     Rate and Rhythm: Normal rate.     Pulses: Normal pulses.  Pulmonary:     Effort: Pulmonary effort  is normal.  Abdominal:     General: Abdomen is flat.  Musculoskeletal:     Cervical back: Normal range of motion.     Comments: Patient has 8 cm laceration behind the left calf and there is fat exposed.  I do not see any tendon or muscle exposed.  Patient is able to flex and extend the toes on the left leg.  Patient has a right AKA  Neurological:     General: No focal deficit present.     Comments: Demented but moving all extremities  Psychiatric:        Mood and Affect: Mood normal.        Behavior: Behavior normal.     ED Results / Procedures / Treatments   Labs (all labs ordered are listed, but only abnormal results are displayed) Labs Reviewed - No data to display  EKG None  Radiology No results found.  Procedures Procedures    LACERATION REPAIR Performed by: Wandra Arthurs Authorized by: Wandra Arthurs Consent: Verbal consent obtained. Risks and benefits: risks, benefits and alternatives were discussed Consent given by: patient Patient identity confirmed: provided demographic data Prepped and Draped in normal sterile fashion Wound explored  Laceration Location: L calf   Laceration Length: 8 cm  No Foreign Bodies seen or palpated  Anesthesia: local infiltration  Local anesthetic: lidocaine 2 % with epinephrine  Anesthetic total: 10 ml  Irrigation method: syringe Amount of cleaning: standard  Skin closure: 4-0 ethilon  Number of sutures: 5  Technique: simple interrupted   Patient tolerance: Patient tolerated the procedure well with no immediate complications.   Medications Ordered in ED Medications  lidocaine-EPINEPHrine (XYLOCAINE W/EPI) 2 %-1:100000 (with pres) injection 20 mL (has no administration in time range)    ED Course/ Medical Decision Making/ A&P                           Medical Decision Making Stephanie Donovan is a 88 y.o. female here presenting with left leg laceration.  Patient is residing at [facility].  Unclear how she got a laceration to the left leg.  Patient is demented and nonambulatory at baseline.  Since there is fat exposed I was able to irrigate the wound and there was no foreign body in it and I was able to close the wound with simple interrupted sutures.  Patient will need suture removal in a week.  I updated her tetanus shot as well    Risk Prescription drug management.    Final Clinical Impression(s) / ED Diagnoses Final diagnoses:  None    Rx / DC Orders ED Discharge Orders     None         [name], MD 07/26/21 1553

## 2021-07-26 NOTE — Discharge Instructions (Signed)
You have a laceration that was sutured.  You need to have suture removal in a week.  You may remove the dressing in 2 days please keep the wound clean and dry.  See your doctor for follow-up  Return to ER if you have purulent drainage, uncontrolled bleeding, fever

## 2021-09-10 ENCOUNTER — Emergency Department (HOSPITAL_COMMUNITY)
Admission: EM | Admit: 2021-09-10 | Discharge: 2021-09-10 | Disposition: A | Discharge status: Home / Self Care | Payer: Medicare Other | Attending: Emergency Medicine | Admitting: Emergency Medicine

## 2021-09-10 ENCOUNTER — Other Ambulatory Visit: Payer: Self-pay

## 2021-09-10 ENCOUNTER — Emergency Department (HOSPITAL_COMMUNITY): Payer: Medicare Other

## 2021-09-10 ENCOUNTER — Encounter (HOSPITAL_COMMUNITY): Payer: Self-pay | Admitting: Emergency Medicine

## 2021-09-10 DIAGNOSIS — F0390 Unspecified dementia without behavioral disturbance: Secondary | ICD-10-CM | POA: Diagnosis not present

## 2021-09-10 DIAGNOSIS — R1013 Epigastric pain: Secondary | ICD-10-CM | POA: Insufficient documentation

## 2021-09-10 DIAGNOSIS — Z79899 Other long term (current) drug therapy: Secondary | ICD-10-CM | POA: Insufficient documentation

## 2021-09-10 DIAGNOSIS — K8080 Other cholelithiasis without obstruction: Secondary | ICD-10-CM

## 2021-09-10 DIAGNOSIS — S32010S Wedge compression fracture of first lumbar vertebra, sequela: Secondary | ICD-10-CM

## 2021-09-10 DIAGNOSIS — R112 Nausea with vomiting, unspecified: Secondary | ICD-10-CM

## 2021-09-10 DIAGNOSIS — N390 Urinary tract infection, site not specified: Secondary | ICD-10-CM

## 2021-09-10 LAB — URINALYSIS, ROUTINE W REFLEX MICROSCOPIC
Bilirubin Urine: NEGATIVE
Glucose, UA: NEGATIVE mg/dL
Hgb urine dipstick: NEGATIVE
Ketones, ur: NEGATIVE mg/dL
Nitrite: NEGATIVE
Protein, ur: NEGATIVE mg/dL
Specific Gravity, Urine: 1.005 (ref 1.005–1.030)
pH: 6 (ref 5.0–8.0)

## 2021-09-10 LAB — COMPREHENSIVE METABOLIC PANEL
ALT: <5 U/L (ref 0–44)
AST: 25 U/L (ref 15–41)
Albumin: 2.8 g/dL — ABNORMAL LOW (ref 3.5–5.0)
Alkaline Phosphatase: 155 U/L — ABNORMAL HIGH (ref 38–126)
Anion gap: 8 (ref 5–15)
BUN: 9 mg/dL (ref 8–23)
CO2: 29 mmol/L (ref 22–32)
Calcium: 8.1 mg/dL — ABNORMAL LOW (ref 8.9–10.3)
Chloride: 98 mmol/L (ref 98–111)
Creatinine, Ser: 0.5 mg/dL (ref 0.44–1.00)
GFR, Estimated: >60 mL/min (ref >60)
Glucose, Bld: 82 mg/dL (ref 70–99)
Potassium: 3.9 mmol/L (ref 3.5–5.1)
Sodium: 135 mmol/L (ref 135–145)
Total Bilirubin: 1 mg/dL (ref 0.3–1.2)
Total Protein: 6.5 g/dL (ref 6.5–8.1)

## 2021-09-10 LAB — CBC WITH DIFFERENTIAL/PLATELET
Abs Immature Granulocytes: 0.03 10*3/uL (ref 0.00–0.07)
Basophils Absolute: 0.1 10*3/uL (ref 0.0–0.1)
Basophils Relative: 1 %
Eosinophils Absolute: 0.1 10*3/uL (ref 0.0–0.5)
Eosinophils Relative: 1 %
HCT: 33.1 % — ABNORMAL LOW (ref 36.0–46.0)
Hemoglobin: 10.7 g/dL — ABNORMAL LOW (ref 12.0–15.0)
Immature Granulocytes: 0 %
Lymphocytes Relative: 17 %
Lymphs Abs: 1.3 10*3/uL (ref 0.7–4.0)
MCH: 30.1 pg (ref 26.0–34.0)
MCHC: 32.3 g/dL (ref 30.0–36.0)
MCV: 93.2 fL (ref 80.0–100.0)
Monocytes Absolute: 1 10*3/uL (ref 0.1–1.0)
Monocytes Relative: 13 %
Neutro Abs: 5.5 10*3/uL (ref 1.7–7.7)
Neutrophils Relative %: 68 %
Platelets: 297 10*3/uL (ref 150–400)
RBC: 3.55 MIL/uL — ABNORMAL LOW (ref 3.87–5.11)
RDW: 15.1 % (ref 11.5–15.5)
WBC: 8 10*3/uL (ref 4.0–10.5)
nRBC: 0 % (ref 0.0–0.2)

## 2021-09-10 LAB — LIPASE, BLOOD: Lipase: 26 U/L (ref 11–51)

## 2021-09-10 LAB — CBG MONITORING, ED: Glucose-Capillary: 93 mg/dL (ref 70–99)

## 2021-09-10 MED ORDER — SODIUM CHLORIDE (PF) 0.9 % IJ SOLN
INTRAMUSCULAR | Status: AC
  Filled 2021-09-10: qty 50

## 2021-09-10 MED ORDER — SODIUM CHLORIDE 0.9 % IV BOLUS
500.0000 mL | Freq: Once | INTRAVENOUS | Status: AC
  Administered 2021-09-10: 500 mL via INTRAVENOUS

## 2021-09-10 MED ORDER — ONDANSETRON HCL 4 MG/2ML IJ SOLN
4.0000 mg | Freq: Once | INTRAMUSCULAR | Status: AC
  Administered 2021-09-10: 4 mg via INTRAVENOUS
  Filled 2021-09-10: qty 2

## 2021-09-10 MED ORDER — IOHEXOL 300 MG/ML  SOLN
100.0000 mL | Freq: Once | INTRAMUSCULAR | Status: AC | PRN
  Administered 2021-09-10: 100 mL via INTRAVENOUS

## 2021-09-10 MED ORDER — CEPHALEXIN 500 MG PO CAPS
500.0000 mg | ORAL_CAPSULE | Freq: Two times a day (BID) | ORAL | 0 refills | Status: DC
Start: 2021-09-10 — End: 2022-01-05

## 2021-09-10 MED ORDER — ONDANSETRON 4 MG PO TBDP: ORAL_TABLET | ORAL | 0 refills | Status: AC

## 2021-09-10 MED ORDER — SODIUM CHLORIDE 0.9 % IV SOLN
1.0000 g | Freq: Once | INTRAVENOUS | Status: AC
  Administered 2021-09-10: 1 g via INTRAVENOUS
  Filled 2021-09-10: qty 10

## 2021-09-10 NOTE — ED Triage Notes (Signed)
BIBA Per EMS: Pt coming from [facility] [place] on [address] w/ c/o Nausea after eating around noon today. Pt also c/o back pain. Weakness & lethargic.  A&O x 2 at baseline VSS 124/69 94 HR 18 RR 119 CBG 100% RA 20G LAC  '4mg'$  zofran given en route

## 2021-09-10 NOTE — ED Notes (Signed)
Contacted [id number] to arrange transport by [facility] back to SNF

## 2021-09-10 NOTE — ED Notes (Signed)
I provided reinforced discharge education based off of after visit summary/care provided. Pt acknowledged and understood my education. Pt had no further questions/concerns for provider/myself. After visit summary provided to pt. 

## 2021-09-10 NOTE — ED Notes (Signed)
In and out cath attempted x 2 - unsuccessful

## 2021-09-10 NOTE — ED Notes (Signed)
Patient transported to CT 

## 2021-09-10 NOTE — Discharge Instructions (Addendum)
You have a mild UTI.  Take Keflex 500 mg twice daily for 5 days.  Your CT scan showed chronic compression fracture but no bowel obstruction  You also have some gallstones.  You can take some Zofran for nausea.  See your doctor for follow-up   Return to ER if you have worse vomiting, back pain, fever

## 2021-09-10 NOTE — ED Provider Notes (Signed)
[facility] DEPT Provider Note   CSN: [id number] Arrival date & time: 09/10/21  1504     History  No chief complaint on file.   Stephanie Donovan is a 88 y.o. female hx of dementia, previous right above-the-knee amputation, here presenting with vomiting.  Patient vomited after eating lunch today.  Patient is demented unable to give me much history.  Patient is nonambulatory at baseline.    The history is provided by the patient.       Home Medications Prior to Admission medications   Medication Sig Start Date End Date Taking? Authorizing Provider  albuterol (VENTOLIN HFA) 108 (90 Base) MCG/ACT inhaler Inhale 2 puffs into the lungs every 6 (six) hours as needed for wheezing or shortness of breath. 06/11/21   Regalado, Belkys A, MD  brimonidine (ALPHAGAN P) 0.1 % SOLN Place 1 drop into the left eye in the morning and at bedtime.    [provider]  chlorproMAZINE (THORAZINE) 10 MG tablet Take 1 tablet (10 mg total) by mouth 3 (three) times daily as needed for vomiting or nausea. 06/11/21   Regalado, Belkys A, MD  ferrous sulfate 325 (65 FE) MG EC tablet Take 325 mg by mouth daily.    [provider]  furosemide (LASIX) 20 MG tablet Take 20 mg by mouth daily.    [provider]  levothyroxine (SYNTHROID) 50 MCG tablet Take 50 mcg by mouth daily before breakfast.    [provider]  magnesium oxide (MAG-OX) 400 (241.3 Mg) MG tablet Take 1 tablet (400 mg total) by mouth 2 (two) times daily. Patient taking differently: Take 400 mg by mouth daily. 11/30/19   Regalado, Belkys A, MD  metoprolol succinate (TOPROL-XL) 25 MG 24 hr tablet Take 0.5 tablets (12.5 mg total) by mouth daily as needed (Give 12.5 mg if HR > 99 bpm). 06/11/21   Regalado, Belkys A, MD  Multiple Vitamin-Folic Acid TABS Take 1 tablet by mouth daily. 0.4 mg    [provider]  pantoprazole (PROTONIX) 40 MG tablet Take 1 tablet (40 mg total) by mouth  daily. 06/12/21   Regalado, Belkys A, MD  timolol (TIMOPTIC) 0.25 % ophthalmic solution Place 1 drop into both eyes 2 (two) times daily. 12/22/19   [provider]  zinc sulfate 220 (50 Zn) MG capsule Take 1 capsule (220 mg total) by mouth daily. 06/11/21   Regalado, Belkys A, MD      Allergies    Aspirin, Penicillins, and Codeine    Review of Systems   Review of Systems  Gastrointestinal:  Positive for abdominal pain and vomiting.  All other systems reviewed and are negative.   Physical Exam Updated Vital Signs There were no vitals taken for this visit. Physical Exam Vitals and nursing note reviewed.  Constitutional:      Comments: Chronically ill  HENT:     Head: Normocephalic.     Nose: Nose normal.     Mouth/Throat:     Mouth: Mucous membranes are dry.  Eyes:     Extraocular Movements: Extraocular movements intact.     Pupils: Pupils are equal, round, and reactive to light.  Cardiovascular:     Rate and Rhythm: Normal rate and regular rhythm.     Pulses: Normal pulses.     Heart sounds: Normal heart sounds.  Pulmonary:     Effort: Pulmonary effort is normal.     Breath sounds: Normal breath sounds.  Abdominal:     Comments: +  Mild epigastric tenderness  Musculoskeletal:     Cervical back: Normal range of motion and neck supple.     Comments: Right above-the-knee amputation which is chronic  Skin:    General: Skin is warm.     Capillary Refill: Capillary refill takes less than 2 seconds.  Neurological:     Mental Status: She is alert.     Comments: Demented, moving all extremities.  No obvious facial droop  Psychiatric:        Mood and Affect: Mood normal.        Behavior: Behavior normal.     ED Results / Procedures / Treatments   Labs (all labs ordered are listed, but only abnormal results are displayed) Labs Reviewed  URINALYSIS, ROUTINE W REFLEX MICROSCOPIC  CBC WITH DIFFERENTIAL/PLATELET  COMPREHENSIVE METABOLIC PANEL  LIPASE, BLOOD  CBG  MONITORING, ED    EKG None  Radiology No results found.  Procedures Procedures    Medications Ordered in ED Medications  sodium chloride 0.9 % bolus 500 mL (has no administration in time range)  ondansetron (ZOFRAN) injection 4 mg (has no administration in time range)    ED Course/ Medical Decision Making/ A&P                           Medical Decision Making Stephanie Donovan is a 88 y.o. female here presenting with vomiting and abdominal pain.  I think likely gastritis.  Consider bowel obstruction as well.  Plan to get CBC and CMP and UA and CT abdomen pelvis.  Will hydrate patient and reassess  6:59 PM I reviewed patient's labs and independently interpreted CT scan.  Labs were unremarkable.  UA showed UTI.  Patient had UA and culture previously that showed E. coli that is pansensitive.  CT was unremarkable except for cholelithiasis and compression fractures which is likely old.  Patient has no vomiting in the ED.  Patient was given Rocephin for UTI.  Patient will be discharged back to facility with Keflex, Zofran  Problems Addressed: Compression fracture of L1 lumbar vertebra, sequela: chronic illness or injury Nausea and vomiting, unspecified vomiting type: acute illness or injury Urinary tract infection without hematuria, site unspecified: acute illness or injury  Amount and/or Complexity of Data Reviewed Labs: ordered. Decision-making details documented in ED Course. Radiology: ordered and independent interpretation performed. Decision-making details documented in ED Course. ECG/medicine tests: ordered and independent interpretation performed. Decision-making details documented in ED Course.  Risk Prescription drug management.    Final Clinical Impression(s) / ED Diagnoses Final diagnoses:  None    Rx / DC Orders ED Discharge Orders     None         [name], MD 09/10/21 1901

## 2021-09-10 NOTE — ED Notes (Signed)
Contacted [facility] to provide report on pt, line did not go through.

## 2021-09-10 NOTE — ED Notes (Signed)
Pt was provided perineal care, new brief applied, repositioned to comfort.

## 2021-09-13 LAB — URINE CULTURE: Culture: >=100000 — AB

## 2021-09-14 ENCOUNTER — Telehealth: Payer: Self-pay

## 2021-09-14 NOTE — Progress Notes (Signed)
ED Antimicrobial Stewardship Positive Culture Follow Up   Stephanie Donovan is an 88 y.o. female who presented to [facility] on 09/10/2021 with a chief complaint of  Chief Complaint  Patient presents with   Nausea    Recent Results (from the past 720 hour(s))  Urine Culture     Status: Abnormal   Collection Time: 09/10/21  3:48 PM   Specimen: In/Out Cath Urine  Result Value Ref Range Status   Specimen Description   Final    IN/OUT CATH URINE Performed at [facility], [place] [address], [place], [place] [address]    Special Requests   Final    NONE Performed at [facility], [place] [address], [place], [place] [address]    Culture >=100,000 COLONIES/mL PROVIDENCIA STUARTII (A)  Final   Report Status 09/13/2021 FINAL  Final   Organism ID, Bacteria PROVIDENCIA STUARTII (A)  Final      Susceptibility   Providencia stuartii - MIC*    AMPICILLIN RESISTANT Resistant     CEFAZOLIN >=64 RESISTANT Resistant     CEFEPIME <=0.12 SENSITIVE Sensitive     CEFTRIAXONE <=0.25 SENSITIVE Sensitive     CIPROFLOXACIN >=4 RESISTANT Resistant     GENTAMICIN RESISTANT Resistant     IMIPENEM 1 SENSITIVE Sensitive     NITROFURANTOIN 128 RESISTANT Resistant     TRIMETH/SULFA <=20 SENSITIVE Sensitive     AMPICILLIN/SULBACTAM 4 SENSITIVE Sensitive     PIP/TAZO <=4 SENSITIVE Sensitive     * >=100,000 COLONIES/mL PROVIDENCIA STUARTII     Appeared in ED on 8/20 w/ c/o vomiting/abdominal discomfort. Lives in a facility. No vomiting was observed in ED. Patient was ultimately given a dose of CTX and d/c'ed w/ Keflex 500 mg BID x 5 days.    Urinalysis yielded: nitrite neg, leuko large, WBC 21-50, and epithelial 0-5. Mildly suspicious for UTI. CBC yielded WBC WNL and a CT scan revealed that the patient has fecal impaction and cholelithiasis; the impaction could be the source of her abdominal pain. Patient's Ucx grew P. Stuartii that is resistant to the Keflex she was  discharged on.    Spoke to provider and it was agreed that d/t Ucx, UA, and possible UTI symptoms along w/ organism resistance to Keflex that Keflex will be d/c'ed and replaced w/ a course of Bactrim SS to treat.  Plan - D/c Keflex 500 mg BID x 5 days - Initiate Bactrim SS BID x 3 days  ED Provider: Edwinna Areola 09/14/2021, 9:46 AM PharmD Candidate Monday - Friday phone -  [phone] Saturday - Sunday phone - [phone]

## 2021-09-14 NOTE — Telephone Encounter (Signed)
Post ED Visit - Positive Culture Follow-up: Successful Patient Follow-Up  Culture assessed and recommendations reviewed by:  '[x]'$  Jimmy Footman, Pharm.D. '[]'$  Heide Guile, Pharm.D., BCPS AQ-ID '[]'$  Parks Neptune, Pharm.D., BCPS '[]'$  Alycia Rossetti, Pharm.D., BCPS '[]'$  Flasher, Pharm.D., BCPS, AAHIVP '[]'$  Legrand Como, Pharm.D., BCPS, AAHIVP '[]'$  Salome Arnt, PharmD, BCPS '[]'$  Johnnette Gourd, PharmD, BCPS '[]'$  Hughes Better, PharmD, BCPS '[]'$  Leeroy Cha, PharmD  Positive urine culture  '[]'$  Patient discharged without antimicrobial prescription and treatment is now indicated '[x]'$  Organism is resistant to prescribed ED discharge antimicrobial '[]'$  Patient with positive blood cultures  Changes discussed with ED provider: Astrid Drafts, PA-C New antibiotic prescription Bactrim SS BID x 3 days Called and faxed to [facility] [place] [facility] [place], date 09/14/2021, time 12:45 pm   Glennon Hamilton 09/14/2021, 12:55 PM

## 2021-12-05 ENCOUNTER — Emergency Department (HOSPITAL_COMMUNITY)
Admission: EM | Admit: 2021-12-05 | Discharge: 2021-12-05 | Disposition: A | Discharge status: Skilled Nursing Facility | Payer: Medicare Other | Attending: Emergency Medicine | Admitting: Emergency Medicine

## 2021-12-05 ENCOUNTER — Emergency Department (HOSPITAL_COMMUNITY): Payer: Medicare Other

## 2021-12-05 ENCOUNTER — Encounter (HOSPITAL_COMMUNITY): Payer: Self-pay | Admitting: Emergency Medicine

## 2021-12-05 ENCOUNTER — Other Ambulatory Visit: Payer: Self-pay

## 2021-12-05 DIAGNOSIS — I509 Heart failure, unspecified: Secondary | ICD-10-CM | POA: Insufficient documentation

## 2021-12-05 DIAGNOSIS — E039 Hypothyroidism, unspecified: Secondary | ICD-10-CM | POA: Insufficient documentation

## 2021-12-05 DIAGNOSIS — I110 Hypertensive heart disease with heart failure: Secondary | ICD-10-CM | POA: Insufficient documentation

## 2021-12-05 DIAGNOSIS — F0390 Unspecified dementia without behavioral disturbance: Secondary | ICD-10-CM | POA: Diagnosis not present

## 2021-12-05 DIAGNOSIS — R42 Dizziness and giddiness: Secondary | ICD-10-CM | POA: Diagnosis not present

## 2021-12-05 DIAGNOSIS — Z79899 Other long term (current) drug therapy: Secondary | ICD-10-CM | POA: Diagnosis not present

## 2021-12-05 DIAGNOSIS — R531 Weakness: Secondary | ICD-10-CM | POA: Insufficient documentation

## 2021-12-05 LAB — BASIC METABOLIC PANEL
Anion gap: 7 (ref 5–15)
BUN: 10 mg/dL (ref 8–23)
CO2: 31 mmol/L (ref 22–32)
Calcium: 8.6 mg/dL — ABNORMAL LOW (ref 8.9–10.3)
Chloride: 99 mmol/L (ref 98–111)
Creatinine, Ser: 0.34 mg/dL — ABNORMAL LOW (ref 0.44–1.00)
GFR, Estimated: >60 mL/min (ref >60)
Glucose, Bld: 102 mg/dL — ABNORMAL HIGH (ref 70–99)
Potassium: 3.5 mmol/L (ref 3.5–5.1)
Sodium: 137 mmol/L (ref 135–145)

## 2021-12-05 LAB — CBC
HCT: 36.3 % (ref 36.0–46.0)
Hemoglobin: 11.6 g/dL — ABNORMAL LOW (ref 12.0–15.0)
MCH: 27.6 pg (ref 26.0–34.0)
MCHC: 32 g/dL (ref 30.0–36.0)
MCV: 86.4 fL (ref 80.0–100.0)
Platelets: 295 10*3/uL (ref 150–400)
RBC: 4.2 MIL/uL (ref 3.87–5.11)
RDW: 13.4 % (ref 11.5–15.5)
WBC: 7.8 10*3/uL (ref 4.0–10.5)
nRBC: 0 % (ref 0.0–0.2)

## 2021-12-05 LAB — URINALYSIS, ROUTINE W REFLEX MICROSCOPIC
Bilirubin Urine: NEGATIVE
Glucose, UA: NEGATIVE mg/dL
Ketones, ur: NEGATIVE mg/dL
Nitrite: NEGATIVE
Protein, ur: NEGATIVE mg/dL
Specific Gravity, Urine: 1.006 (ref 1.005–1.030)
pH: 8 (ref 5.0–8.0)

## 2021-12-05 LAB — CBG MONITORING, ED: Glucose-Capillary: 84 mg/dL (ref 70–99)

## 2021-12-05 LAB — TROPONIN I (HIGH SENSITIVITY): Troponin I (High Sensitivity): 4 ng/L (ref <18)

## 2021-12-05 NOTE — ED Triage Notes (Signed)
Patient presents for [facility] at [facility] with complaints of weakness and lightheadedness this morning. She was nauseous, but that has subsided.    EMS vitals: 125/63 BP 86 HR 98% SPO2 on room air 114 CBG

## 2021-12-05 NOTE — ED Provider Notes (Signed)
Transferred to me.  Chest x-ray and CT images personally viewed/interpreted by myself and no evidence of pneumonia or head bleed respectively.  UA does not seem consistent with UTI but given her age and previous UTIs urine culture will be sent.  I discussed her work-up and negative results with daughter over the phone.  Will DC back to facility.   Sherwood Gambler, MD 12/05/21 [phone]

## 2021-12-05 NOTE — Discharge Instructions (Addendum)
If you develop fever, confusion, chest pain, vomiting, or any other new/concerning symptoms, then return to the ER or call 911.

## 2021-12-05 NOTE — ED Provider Notes (Signed)
[name] DEPT Provider Note   CSN: [id number] Arrival date & time: 12/05/21  1321     History  Chief Complaint  Patient presents with   Weakness   Dizziness    Stephanie Donovan is a 88 y.o. female.   Weakness Dizziness Associated symptoms: weakness      88 year old female with medical history significant for glaucoma, chronic back pain, TIA, atrial fibrillation with RVR, chronic constipation, anemia, HTN, CHF, hypothyroidism, HLD who presents to the emergency department from her facility [facility] with a chief complaint of generalized weakness and fatigue and some lightheadedness this morning.  Symptoms have since subsided.  The patient denies any complaints or symptoms at this time.  I attempted to contact the patient's facility for additional collateral information however was unable to reach them after 2 attempts.  I spoke with the patient's daughter, Stephanie Donovan at [phone] who has not seen her mother today but states that she frequently gets urinary tract infections.  The patient is currently asymptomatic and well-appearing.  Note no recent falls or trauma.  She had been feeling nauseous earlier today.  No syncope.  No fevers or chills.  Vitals stable with EMS and a CBG of 114 was noted. Per the patient's daughter, she does have dementia at baseline with sundowning and forgetfulness noted more later in the afternoon and towards the evenings.  Home Medications Prior to Admission medications   Medication Sig Start Date End Date Taking? Authorizing Provider  albuterol (VENTOLIN HFA) 108 (90 Base) MCG/ACT inhaler Inhale 2 puffs into the lungs every 6 (six) hours as needed for wheezing or shortness of breath. 06/11/21   Regalado, Belkys A, MD  brimonidine (ALPHAGAN P) 0.1 % SOLN Place 1 drop into the left eye in the morning and at bedtime.    [provider]  cephALEXin (KEFLEX) 500 MG capsule Take 1 capsule (500 mg total) by  mouth 2 (two) times daily. 09/10/21   [name], MD  chlorproMAZINE (THORAZINE) 10 MG tablet Take 1 tablet (10 mg total) by mouth 3 (three) times daily as needed for vomiting or nausea. 06/11/21   Regalado, Belkys A, MD  ferrous sulfate 325 (65 FE) MG EC tablet Take 325 mg by mouth daily.    [provider]  furosemide (LASIX) 20 MG tablet Take 20 mg by mouth daily.    [provider]  levothyroxine (SYNTHROID) 50 MCG tablet Take 50 mcg by mouth daily before breakfast.    [provider]  magnesium oxide (MAG-OX) 400 (241.3 Mg) MG tablet Take 1 tablet (400 mg total) by mouth 2 (two) times daily. Patient taking differently: Take 400 mg by mouth daily. 11/30/19   Regalado, Belkys A, MD  metoprolol succinate (TOPROL-XL) 25 MG 24 hr tablet Take 0.5 tablets (12.5 mg total) by mouth daily as needed (Give 12.5 mg if HR > 99 bpm). 06/11/21   Regalado, Belkys A, MD  Multiple Vitamin-Folic Acid TABS Take 1 tablet by mouth daily. 0.4 mg    [provider]  ondansetron (ZOFRAN-ODT) 4 MG disintegrating tablet '4mg'$  ODT q4 hours prn nausea/vomit 09/10/21   [name], MD  pantoprazole (PROTONIX) 40 MG tablet Take 1 tablet (40 mg total) by mouth daily. 06/12/21   Regalado, Belkys A, MD  timolol (TIMOPTIC) 0.25 % ophthalmic solution Place 1 drop into both eyes 2 (two) times daily. 12/22/19   [provider]  zinc sulfate 220 (50 Zn) MG capsule Take 1 capsule (220  mg total) by mouth daily. 06/11/21   Regalado, Belkys A, MD      Allergies    Aspirin, Penicillins, and Codeine    Review of Systems   Review of Systems  Unable to perform ROS: Dementia  Neurological:  Positive for weakness and light-headedness.    Physical Exam Updated Vital Signs BP (!) 141/93   Pulse 88   Temp 97.8 F (36.6 C) (Oral)   Resp 20   SpO2 99%  Physical Exam Vitals and nursing note reviewed.  Constitutional:      General: She is not in acute distress.    Appearance: She  is well-developed.  HENT:     Head: Normocephalic and atraumatic.  Eyes:     Conjunctiva/sclera: Conjunctivae normal.  Cardiovascular:     Rate and Rhythm: Normal rate and regular rhythm.     Pulses: Normal pulses.  Pulmonary:     Effort: Pulmonary effort is normal. No respiratory distress.     Breath sounds: Normal breath sounds.  Abdominal:     Palpations: Abdomen is soft.     Tenderness: There is no abdominal tenderness.  Musculoskeletal:        General: No swelling.     Cervical back: Neck supple.  Skin:    General: Skin is warm and dry.     Capillary Refill: Capillary refill takes less than 2 seconds.  Neurological:     General: No focal deficit present.     Mental Status: She is alert. Mental status is at baseline.     Cranial Nerves: No cranial nerve deficit.     Sensory: No sensory deficit.     Motor: No weakness.     Comments: AAOx2  Psychiatric:        Mood and Affect: Mood normal.     ED Results / Procedures / Treatments   Labs (all labs ordered are listed, but only abnormal results are displayed) Labs Reviewed  BASIC METABOLIC PANEL - Abnormal; Notable for the following components:      Result Value   Glucose, Bld 102 (*)    Creatinine, Ser 0.34 (*)    Calcium 8.6 (*)    All other components within normal limits  CBC - Abnormal; Notable for the following components:   Hemoglobin 11.6 (*)    All other components within normal limits  URINALYSIS, ROUTINE W REFLEX MICROSCOPIC - Abnormal; Notable for the following components:   Color, Urine STRAW (*)    Hgb urine dipstick SMALL (*)    Leukocytes,Ua TRACE (*)    Bacteria, UA RARE (*)    All other components within normal limits  CBG MONITORING, ED  TROPONIN I (HIGH SENSITIVITY)    EKG EKG Interpretation  Date/Time:  Tuesday December 05 2021 14:04:23 EST Ventricular Rate:  82 PR Interval:  154 QRS Duration: 87 QT Interval:  396 QTC Calculation: 463 R Axis:   44 Text Interpretation: Sinus rhythm  Atrial premature complex Borderline T abnormalities, anterior leads Confirmed by Regan Lemming ([id number]) on 12/05/2021 3:47:41 PM  Radiology DG Chest Portable 1 View  Result Date: 12/05/2021 CLINICAL DATA:  Near syncope EXAM: PORTABLE CHEST 1 VIEW COMPARISON:  09/10/2021 FINDINGS: Cardiac size is within normal limits. Lung fields are clear of any infiltrates or pulmonary edema. There is no pleural effusion or pneumothorax. There is previous reverse arthroplasty in right shoulder. IMPRESSION: No active disease. Electronically Signed   By: Elmer Picker M.D.   On: 12/05/2021 17:15    Procedures Procedures  Medications Ordered in ED Medications - No data to display  ED Course/ Medical Decision Making/ A&P                           Medical Decision Making Amount and/or Complexity of Data Reviewed Labs: ordered. Radiology: ordered.    88 year old female with medical history significant for glaucoma, chronic back pain, TIA, atrial fibrillation with RVR, chronic constipation, anemia, HTN, CHF, hypothyroidism, HLD who presents to the emergency department from her facility [facility] with a chief complaint of generalized weakness and fatigue and some lightheadedness this morning.  Symptoms have since subsided.  The patient denies any complaints or symptoms at this time.  I attempted to contact the patient's facility for additional collateral information however was unable to reach them after 2 attempts.  I spoke with the patient's daughter, Stephanie Donovan at [phone] who has not seen her mother today but states that she frequently gets urinary tract infections.  The patient is currently asymptomatic and well-appearing.  Note no recent falls or trauma.  She had been feeling nauseous earlier today.  No syncope.  No fevers or chills.  Vitals stable with EMS and a CBG of 114 was noted. Per the patient's daughter, she does have dementia at baseline with sundowning and forgetfulness noted more  later in the afternoon and towards the evenings.  On arrival, the patient was vitally stable, presenting with an episode of fatigue, lightheadedness, generalized weakness earlier this morning.  Symptoms have since resolved.  The patient peers to be at her baseline mental status after discussion with the patient's daughter.  She did state that she frequently gets urinary tract infections which can cause worsening confusion and fatigue and weakness.  Logically intact.  No infectious symptoms.  No clear episode of syncope.  The patient's initial work-up revealed an EKG, ventricular rate 82, sinus rhythm, borderline T wave changes, patient denies any chest pain at this time and initial troponin was found to be 4.  Low concern for ACS, PE, electrolyte abnormality, toxic metabolic encephalopathy, pneumonia, intra-abdominal emergency, CVA.  Patient appears to be at her baseline and is asymptomatic at this time.  Considered UTI as etiology of the patient's symptoms this morning.   Laboratory work-up significant for CBC without a leukocytosis, mild anemia to 11.6 noted, improvement from prior hemoglobin measurements, BMP without significant electrolyte abnormality, normal renal function, urinalysis pending.  Chest x-ray performed revealed no active disease.  Plan at time of signout to Dr. Verner Chol at 1500 was to follow-up patient urinalysis, follow-up CT head, plan for likely discharge back to the patient's facility tonight.  The ultimate plan of care was conveyed to the patient's daughter over the phone.    Final Clinical Impression(s) / ED Diagnoses Final diagnoses:  Generalized weakness  Lightheadedness    Rx / DC Orders ED Discharge Orders     None         Regan Lemming, MD 12/05/21 1759

## 2021-12-05 NOTE — ED Notes (Signed)
Patient removes equipment and pulled out her IV.

## 2021-12-08 LAB — URINE CULTURE: Culture: >=100000 — AB

## 2021-12-09 ENCOUNTER — Telehealth (HOSPITAL_BASED_OUTPATIENT_CLINIC_OR_DEPARTMENT_OTHER): Payer: Self-pay | Admitting: Emergency Medicine

## 2021-12-09 NOTE — Telephone Encounter (Signed)
Post ED Visit - Positive Culture Follow-up: Successful Patient Follow-Up  Culture assessed and recommendations reviewed by:  '[]'$  Elenor Quinones, Pharm.D. '[]'$  Heide Guile, Pharm.D., BCPS AQ-ID '[]'$  Parks Neptune, Pharm.D., BCPS '[]'$  Alycia Rossetti, Pharm.D., BCPS '[]'$  [place]cliffe, Pharm.D., BCPS, AAHIVP '[]'$  Legrand Como, Pharm.D., BCPS, AAHIVP '[]'$  Salome Arnt, PharmD, BCPS '[]'$  Johnnette Gourd, PharmD, BCPS '[]'$  Hughes Better, PharmD, BCPS '[]'$  Leeroy Cha, PharmD  Positive urine culture  '[x]'$  Patient discharged without antimicrobial prescription and treatment is now indicated '[]'$  Organism is resistant to prescribed ED discharge antimicrobial '[]'$  Patient with positive blood cultures  Changes discussed with ED provider: Sherrell Puller PA New antibiotic prescription start fosfomycin 1 pack q 72 hours x 2 doses Attempting to contact SNF   Hazle Nordmann 12/09/2021, 2:39 PM

## 2021-12-09 NOTE — Progress Notes (Signed)
ED Antimicrobial Stewardship Positive Culture Follow Up   Stephanie Donovan is an 88 y.o. female who presented to [facility] on 12/05/2021 with a chief complaint of  Chief Complaint  Patient presents with   Weakness   Dizziness    Recent Results (from the past 720 hour(s))  Urine Culture     Status: Abnormal   Collection Time: 12/05/21  5:30 PM   Specimen: Urine, Clean Catch  Result Value Ref Range Status   Specimen Description   Final    URINE, CLEAN CATCH Performed at [facility], [place] [address], [place], [place] [address]    Special Requests   Final    NONE Performed at [facility], [place] [address] [place], [place] [address]    Culture (A)  Final    >=100,000 COLONIES/mL PROVIDENCIA STUARTII Susceptibility Pattern Suggests Possibility of an Extended Spectrum Beta Lactamase Producer. Contact Laboratory Within 7 Days if Confirmation Warranted. Performed at [facility], [place] [address], [place], [place] [address]    Report Status 12/08/2021 FINAL  Final   Organism ID, Bacteria PROVIDENCIA STUARTII (A)  Final      Susceptibility   Providencia stuartii - MIC*    AMPICILLIN >=32 RESISTANT Resistant     CEFAZOLIN >=64 RESISTANT Resistant     CEFEPIME 4 INTERMEDIATE Intermediate     CEFTRIAXONE >=64 RESISTANT Resistant     CIPROFLOXACIN >=4 RESISTANT Resistant     GENTAMICIN RESISTANT Resistant     IMIPENEM 2 SENSITIVE Sensitive     NITROFURANTOIN 128 RESISTANT Resistant     TRIMETH/SULFA <=20 SENSITIVE Sensitive     AMPICILLIN/SULBACTAM >=32 RESISTANT Resistant     PIP/TAZO 8 SENSITIVE Sensitive     * >=100,000 COLONIES/mL PROVIDENCIA STUARTII    '[]'$  Treated with , organism resistant to prescribed antimicrobial '[x]'$  Patient discharged originally without antimicrobial agent and treatment is now indicated  New antibiotic prescription:  - Fosfomycin 1 pack q72 hours x 2 doses   ED Provider:  - Sherrell Puller [facility]     Royetta Asal, PharmD, BCPS 12/09/2021 11:54 AM

## 2021-12-12 ENCOUNTER — Telehealth (HOSPITAL_BASED_OUTPATIENT_CLINIC_OR_DEPARTMENT_OTHER): Payer: Self-pay | Admitting: Emergency Medicine

## 2022-01-05 ENCOUNTER — Other Ambulatory Visit: Payer: Self-pay

## 2022-01-05 ENCOUNTER — Encounter (HOSPITAL_BASED_OUTPATIENT_CLINIC_OR_DEPARTMENT_OTHER): Payer: Self-pay

## 2022-01-05 ENCOUNTER — Emergency Department (HOSPITAL_BASED_OUTPATIENT_CLINIC_OR_DEPARTMENT_OTHER)
Admission: EM | Admit: 2022-01-05 | Discharge: 2022-01-05 | Disposition: A | Discharge status: Skilled Nursing Facility | Payer: Medicare Other | Attending: Emergency Medicine | Admitting: Emergency Medicine

## 2022-01-05 DIAGNOSIS — R5383 Other fatigue: Secondary | ICD-10-CM | POA: Diagnosis not present

## 2022-01-05 DIAGNOSIS — I110 Hypertensive heart disease with heart failure: Secondary | ICD-10-CM | POA: Insufficient documentation

## 2022-01-05 DIAGNOSIS — Z79899 Other long term (current) drug therapy: Secondary | ICD-10-CM | POA: Diagnosis not present

## 2022-01-05 DIAGNOSIS — F0390 Unspecified dementia without behavioral disturbance: Secondary | ICD-10-CM | POA: Diagnosis not present

## 2022-01-05 DIAGNOSIS — R110 Nausea: Secondary | ICD-10-CM | POA: Diagnosis present

## 2022-01-05 DIAGNOSIS — Z8673 Personal history of transient ischemic attack (TIA), and cerebral infarction without residual deficits: Secondary | ICD-10-CM | POA: Diagnosis not present

## 2022-01-05 DIAGNOSIS — D72829 Elevated white blood cell count, unspecified: Secondary | ICD-10-CM | POA: Insufficient documentation

## 2022-01-05 DIAGNOSIS — E039 Hypothyroidism, unspecified: Secondary | ICD-10-CM | POA: Insufficient documentation

## 2022-01-05 DIAGNOSIS — Z89511 Acquired absence of right leg below knee: Secondary | ICD-10-CM | POA: Insufficient documentation

## 2022-01-05 DIAGNOSIS — N3000 Acute cystitis without hematuria: Secondary | ICD-10-CM | POA: Diagnosis not present

## 2022-01-05 DIAGNOSIS — E871 Hypo-osmolality and hyponatremia: Secondary | ICD-10-CM | POA: Diagnosis not present

## 2022-01-05 DIAGNOSIS — Z1152 Encounter for screening for COVID-19: Secondary | ICD-10-CM | POA: Insufficient documentation

## 2022-01-05 DIAGNOSIS — I509 Heart failure, unspecified: Secondary | ICD-10-CM | POA: Insufficient documentation

## 2022-01-05 LAB — CBC WITH DIFFERENTIAL/PLATELET
Abs Immature Granulocytes: 0.03 10*3/uL (ref 0.00–0.07)
Basophils Absolute: 0.1 10*3/uL (ref 0.0–0.1)
Basophils Relative: 1 %
Eosinophils Absolute: 0.1 10*3/uL (ref 0.0–0.5)
Eosinophils Relative: 1 %
HCT: 36.4 % (ref 36.0–46.0)
Hemoglobin: 11.7 g/dL — ABNORMAL LOW (ref 12.0–15.0)
Immature Granulocytes: 0 %
Lymphocytes Relative: 17 %
Lymphs Abs: 1.5 10*3/uL (ref 0.7–4.0)
MCH: 27.5 pg (ref 26.0–34.0)
MCHC: 32.1 g/dL (ref 30.0–36.0)
MCV: 85.4 fL (ref 80.0–100.0)
Monocytes Absolute: 0.8 10*3/uL (ref 0.1–1.0)
Monocytes Relative: 9 %
Neutro Abs: 6.7 10*3/uL (ref 1.7–7.7)
Neutrophils Relative %: 72 %
Platelets: 295 10*3/uL (ref 150–400)
RBC: 4.26 MIL/uL (ref 3.87–5.11)
RDW: 14.2 % (ref 11.5–15.5)
WBC: 9.3 10*3/uL (ref 4.0–10.5)
nRBC: 0 % (ref 0.0–0.2)

## 2022-01-05 LAB — URINALYSIS, ROUTINE W REFLEX MICROSCOPIC
Bilirubin Urine: NEGATIVE
Glucose, UA: NEGATIVE mg/dL
Hgb urine dipstick: NEGATIVE
Ketones, ur: NEGATIVE mg/dL
Nitrite: NEGATIVE
Protein, ur: NEGATIVE mg/dL
Specific Gravity, Urine: 1.005 (ref 1.005–1.030)
pH: 7 (ref 5.0–8.0)

## 2022-01-05 LAB — RESP PANEL BY RT-PCR (RSV, FLU A&B, COVID)  RVPGX2
Influenza A by PCR: NEGATIVE
Influenza B by PCR: NEGATIVE
Resp Syncytial Virus by PCR: NEGATIVE
SARS Coronavirus 2 by RT PCR: NEGATIVE

## 2022-01-05 LAB — BASIC METABOLIC PANEL
Anion gap: 8 (ref 5–15)
BUN: 7 mg/dL — ABNORMAL LOW (ref 8–23)
CO2: 31 mmol/L (ref 22–32)
Calcium: 8.7 mg/dL — ABNORMAL LOW (ref 8.9–10.3)
Chloride: 95 mmol/L — ABNORMAL LOW (ref 98–111)
Creatinine, Ser: 0.42 mg/dL — ABNORMAL LOW (ref 0.44–1.00)
GFR, Estimated: >60 mL/min (ref >60)
Glucose, Bld: 99 mg/dL (ref 70–99)
Potassium: 3.7 mmol/L (ref 3.5–5.1)
Sodium: 134 mmol/L — ABNORMAL LOW (ref 135–145)

## 2022-01-05 MED ORDER — CEPHALEXIN 250 MG PO CAPS
500.0000 mg | ORAL_CAPSULE | Freq: Once | ORAL | Status: AC
  Administered 2022-01-05: 500 mg via ORAL
  Filled 2022-01-05: qty 2

## 2022-01-05 MED ORDER — CEPHALEXIN 500 MG PO CAPS: 500.0000 mg | ORAL_CAPSULE | Freq: Two times a day (BID) | ORAL | 0 refills | Status: AC

## 2022-01-05 MED ORDER — SODIUM CHLORIDE 0.9 % IV BOLUS: 500.0000 mL | Freq: Once | INTRAVENOUS | Status: DC

## 2022-01-05 NOTE — ED Notes (Signed)
Report attempted x5. No answer. Family given update.

## 2022-01-05 NOTE — ED Provider Notes (Cosign Needed Addendum)
[facility] EMERGENCY DEPT Provider Note   CSN: [id number] Arrival date & time: 01/05/22  1313     History  Chief Complaint  Patient presents with   Nausea    Stephanie Donovan is a 88 y.o. female.  With a history of dementia, previous stroke, right-sided above-the-knee amputation, hypertension, hypothyroidism, CHF who presents to the ED via EMS from nursing home for evaluation of 2 days of malaise and 1 episode of nausea without emesis.  At the time of my evaluation patient is alert and oriented to self which appears to be her baseline.  She is not complaining of any symptoms at this time.  Specifically denies nausea, vomiting, abdominal pain, diarrhea, dysuria, chest pain, shortness of breath, pain anywhere else, headaches, congestion, rhinorrhea  HPI     Home Medications Prior to Admission medications   Medication Sig Start Date End Date Taking? Authorizing Provider  cephALEXin (KEFLEX) 500 MG capsule Take 1 capsule (500 mg total) by mouth 2 (two) times daily for 7 days. 01/05/22 01/12/22 Yes Pierrette Scheu, Grafton Folk, PA-C  albuterol (VENTOLIN HFA) 108 (90 Base) MCG/ACT inhaler Inhale 2 puffs into the lungs every 6 (six) hours as needed for wheezing or shortness of breath. 06/11/21   Regalado, Belkys A, MD  brimonidine (ALPHAGAN P) 0.1 % SOLN Place 1 drop into the left eye in the morning and at bedtime.    [provider]  chlorproMAZINE (THORAZINE) 10 MG tablet Take 1 tablet (10 mg total) by mouth 3 (three) times daily as needed for vomiting or nausea. 06/11/21   Regalado, Belkys A, MD  ferrous sulfate 325 (65 FE) MG EC tablet Take 325 mg by mouth daily.    [provider]  furosemide (LASIX) 20 MG tablet Take 20 mg by mouth daily.    [provider]  levothyroxine (SYNTHROID) 50 MCG tablet Take 50 mcg by mouth daily before breakfast.    [provider]  magnesium oxide (MAG-OX) 400 (241.3 Mg) MG tablet Take 1 tablet (400 mg total) by  mouth 2 (two) times daily. Patient taking differently: Take 400 mg by mouth daily. 11/30/19   Regalado, Belkys A, MD  metoprolol succinate (TOPROL-XL) 25 MG 24 hr tablet Take 0.5 tablets (12.5 mg total) by mouth daily as needed (Give 12.5 mg if HR > 99 bpm). 06/11/21   Regalado, Belkys A, MD  Multiple Vitamin-Folic Acid TABS Take 1 tablet by mouth daily. 0.4 mg    [provider]  ondansetron (ZOFRAN-ODT) 4 MG disintegrating tablet '4mg'$  ODT q4 hours prn nausea/vomit 09/10/21   [name], MD  pantoprazole (PROTONIX) 40 MG tablet Take 1 tablet (40 mg total) by mouth daily. 06/12/21   Regalado, Belkys A, MD  timolol (TIMOPTIC) 0.25 % ophthalmic solution Place 1 drop into both eyes 2 (two) times daily. 12/22/19   [provider]  zinc sulfate 220 (50 Zn) MG capsule Take 1 capsule (220 mg total) by mouth daily. 06/11/21   Regalado, Belkys A, MD      Allergies    Aspirin, Penicillins, and Codeine    Review of Systems   Review of Systems  Constitutional:  Positive for fatigue.  All other systems reviewed and are negative.   Physical Exam Updated Vital Signs BP (!) 140/83   Pulse 75   Temp (!) 97.5 F (36.4 C) (Oral)   Resp 17   Ht 5' (1.524 m)   Wt 51.7 kg   SpO2 98%   BMI 22.26 kg/m  Physical Exam Vitals and nursing note reviewed.  Constitutional:      General: She is not in acute distress.    Appearance: She is well-developed.  HENT:     Head: Normocephalic and atraumatic.     Nose: No congestion or rhinorrhea.  Eyes:     Extraocular Movements: Extraocular movements intact.     Conjunctiva/sclera: Conjunctivae normal.     Pupils: Pupils are equal, round, and reactive to light.  Cardiovascular:     Rate and Rhythm: Normal rate and regular rhythm.     Heart sounds: No murmur heard. Pulmonary:     Effort: Pulmonary effort is normal. No respiratory distress.     Breath sounds: Normal breath sounds.  Abdominal:     Palpations: Abdomen is soft.      Tenderness: There is no abdominal tenderness.  Musculoskeletal:        General: No swelling.     Cervical back: Neck supple.  Skin:    General: Skin is warm and dry.     Capillary Refill: Capillary refill takes less than 2 seconds.  Neurological:     General: No focal deficit present.     Mental Status: She is alert. Mental status is at baseline.     Comments: Alert and oriented to self. no facial asymmetry, slurred speech, unilateral or global weakness, pronator drift.  Normal finger-to-nose.  Unable to perform heel-to-shin due to amputation.  Psychiatric:        Mood and Affect: Mood normal.     ED Results / Procedures / Treatments   Labs (all labs ordered are listed, but only abnormal results are displayed) Labs Reviewed  BASIC METABOLIC PANEL - Abnormal; Notable for the following components:      Result Value   Sodium 134 (*)    Chloride 95 (*)    BUN 7 (*)    Creatinine, Ser 0.42 (*)    Calcium 8.7 (*)    All other components within normal limits  CBC WITH DIFFERENTIAL/PLATELET - Abnormal; Notable for the following components:   Hemoglobin 11.7 (*)    All other components within normal limits  URINALYSIS, ROUTINE W REFLEX MICROSCOPIC - Abnormal; Notable for the following components:   Leukocytes,Ua SMALL (*)    Bacteria, UA RARE (*)    All other components within normal limits  RESP PANEL BY RT-PCR (RSV, FLU A&B, COVID)  RVPGX2  URINE CULTURE    EKG None  Radiology No results found.  Procedures Procedures    Medications Ordered in ED Medications  cephALEXin (KEFLEX) capsule 500 mg (has no administration in time range)    ED Course/ Medical Decision Making/ A&P Clinical Course as of 01/05/22 1548  Fri Jan 05, 2022  1357 Nurse Adam spoke with daughter who reiterates that patient has been complaining of malaise for the past 2 days, had an episode of nausea without emesis [AS]  1410 Spoke with Shanique at [facility] who states that  the patient was complaining of some sudden onset nausea without emesis approximately 2 hours prior to arrival.  Was dropping her head while she was sitting in her chair and acting more tired than usual.  This is only been since today. [AS]  34 Spoke with daughter [place] who states that she told the nursing home that she send her if it was not necessary.  Would still like the patient checked for UTI to make sure that Christmas goes well and they do not have to  come back. [AS]    Clinical Course User Index [AS] Teneka Malmberg, Grafton Folk, PA-C                           Medical Decision Making Amount and/or Complexity of Data Reviewed Labs: ordered.  This patient presents to the ED for concern of malaise, this involves an extensive number of treatment options, and is a complaint that carries with it a high risk of complications and morbidity. The differential diagnosis of weakness includes but is not limited to neurologic causes (GBS, myasthenia gravis, CVA, MS, ALS, transverse myelitis, spinal cord injury, CVA, botulism ) and other causes: ACS, Arrhythmia, syncope, orthostatic hypotension, sepsis, hypoglycemia, electrolyte disturbance, hypothyroidism, respiratory failure, symptomatic anemia, dehydration, heat injury, polypharmacy, malignancy.    Co morbidities that complicate the patient evaluation  dementia, previous stroke, right-sided above-the-knee amputation, hypertension, hypothyroidism, CHF  My initial workup includes basic labs, urinalysis, EKG, respiratory panel  Additional history obtained from: Nursing notes from this visit. Previous records within EMR system ED visit for same complaints on 12/05/2021.  I ordered, reviewed and interpreted labs which include: BMP, CBC, urinalysis, respiratory panel.  Stable anemia of 11.7.  Very slight hyponatremia of 134.  Respiratory panel negative.  Afebrile, hemodynamically stable.  88 year old female presenting to the ED for evaluation of what the  nursing home describes as malaise.  She was also complaining of a short episode of nausea without emesis.  Per nursing home, patient has medications for nausea but none were given.  She is not complaining of nausea on my evaluation.  Patient does have dementia and is not a good historian, however denies all symptoms.  Her physical exam is largely unremarkable.  There are no adventitious breath sounds or focal neurologic findings.  I have low suspicion for intracranial abnormalities or pneumonia as cause of her " malaise." Urinalysis shows small leukocytes and rare bacteria.  Low threshold for UTI.  Will initiate antibiotics. Has taken keflex in the past.  Dr. Thersa Salt was called and updated.  She is in agreement with the plan.  Patient will be transported back to her nursing home.  Stable at discharge.  At this time there does not appear to be any evidence of an acute emergency medical condition and the patient appears stable for discharge with appropriate outpatient follow up. Diagnosis was discussed with patient who verbalizes understanding of care plan and is agreeable to discharge. I have discussed return precautions with patient and daughter via telephone who verbalizes understanding. Patient encouraged to follow-up with their PCP within 1 week. All questions answered.  Patient's case discussed with Dr. Regenia Skeeter who agrees with plan to discharge with follow-up.   Note: Portions of this report may have been transcribed using voice recognition software. Every effort was made to ensure accuracy; however, inadvertent computerized transcription errors may still be present.    Final Clinical Impression(s) / ED Diagnoses Final diagnoses:  Acute cystitis without hematuria    Rx / DC Orders ED Discharge Orders          Ordered    cephALEXin (KEFLEX) 500 MG capsule  2 times daily        01/05/22 1545              Arlind Klingerman, Grafton Folk, PA-C 01/05/22 1512    Debar Plate, Grafton Folk,  PA-C 01/05/22 1548    Sherwood Gambler, MD 01/08/22 1454

## 2022-01-05 NOTE — ED Notes (Signed)
Returns to [facility] via [facility] transport.

## 2022-01-05 NOTE — ED Notes (Signed)
UA and culture sent.

## 2022-01-05 NOTE — Discharge Instructions (Addendum)
You have been seen today for your complaint of weakness, nausea. Your discharge medications include Keflex.This is an antibiotic. You should take it as prescribed. You should take it for the entire duration of the prescription. This may cause an upset stomach. This is normal. You may take this with food. You may also eat yogurt to prevent diarrhea.  . Follow up with: your PCP in 7 days Please seek immediate medical care if you develop any of the following symptoms: You have very bad back pain. You have very bad pain in your lower belly. You have a fever. You have chills. You feeling like you will vomit or you vomit. At this time there does not appear to be the presence of an emergent medical condition, however there is always the potential for conditions to change. Please read and follow the below instructions.  Do not take your medicine if  develop an itchy rash, swelling in your mouth or lips, or difficulty breathing; call 911 and seek immediate emergency medical attention if this occurs.  You may review your lab tests and imaging results in their entirety on your MyChart account.  Please discuss all results of fully with your primary care provider and other specialist at your follow-up visit.  Note: Portions of this text may have been transcribed using voice recognition software. Every effort was made to ensure accuracy; however, inadvertent computerized transcription errors may still be present.

## 2022-01-05 NOTE — ED Notes (Signed)
Provided dinner meal

## 2022-01-05 NOTE — ED Triage Notes (Signed)
Pt denies feeling nausea or abnormal today. Pt with hx of dementia is oriented to self.

## 2022-01-05 NOTE — ED Triage Notes (Signed)
Pt BIB [facility] from [facility]. EMS report pt has had 2 "a few" days of generalized malaise followed by nausea that started today. No vomiting.   EMS Vitals:  130/90 HR 80 SpO2 98%

## 2022-01-05 NOTE — ED Notes (Signed)
Pt's daughter ([place]) notified via phone that pt was brought to the ED. Family did not have any additional information to add to pt complaints.

## 2022-01-08 LAB — URINE CULTURE: Culture: 10000 — AB

## 2022-01-09 ENCOUNTER — Telehealth (HOSPITAL_BASED_OUTPATIENT_CLINIC_OR_DEPARTMENT_OTHER): Payer: Self-pay | Admitting: *Deleted

## 2022-01-09 NOTE — Telephone Encounter (Signed)
Post ED Visit - Positive Culture Follow-up  Culture report reviewed by antimicrobial stewardship pharmacist: [place] Team '[]'$  Elenor Quinones, Pharm.D. '[]'$  Heide Guile, Pharm.D., BCPS AQ-ID '[]'$  Parks Neptune, Pharm.D., BCPS '[]'$  Alycia Rossetti, Pharm.D., BCPS '[]'$  [place], Pharm.D., BCPS, AAHIVP '[]'$  Legrand Como, Pharm.D., BCPS, AAHIVP '[]'$  Salome Arnt, PharmD, BCPS '[]'$  Johnnette Gourd, PharmD, BCPS '[]'$  Hughes Better, PharmD, BCPS '[]'$  Leeroy Cha, PharmD '[]'$  Laqueta Linden, PharmD, BCPS '[]'$  Albertina Parr, PharmD  [place] Team '[]'$  Leodis Sias, PharmD '[]'$  Lindell Spar, PharmD '[]'$  Royetta Asal, PharmD '[]'$  Graylin Shiver, Rph '[]'$  Rema Fendt) Glennon Mac, PharmD '[]'$  Arlyn Dunning, PharmD '[]'$  Netta Cedars, PharmD '[]'$  Dia Sitter, PharmD '[]'$  Leone Haven, PharmD '[]'$  Gretta Arab, PharmD '[]'$  Theodis Shove, PharmD '[]'$  Peggyann Juba, PharmD '[]'$  Reuel Boom, PharmD   Positive urine culture Do not treat and no further patient follow-up is required at this time. Charmaine Downs, PA-C  [place] 01/09/2022, 10:09 AM

## 2023-02-06 ENCOUNTER — Emergency Department (HOSPITAL_BASED_OUTPATIENT_CLINIC_OR_DEPARTMENT_OTHER): Payer: Medicare Other

## 2023-02-06 ENCOUNTER — Emergency Department (HOSPITAL_BASED_OUTPATIENT_CLINIC_OR_DEPARTMENT_OTHER)
Admission: EM | Admit: 2023-02-06 | Discharge: 2023-02-07 | Disposition: A | Discharge status: Home / Self Care | Payer: Medicare Other | Attending: Emergency Medicine | Admitting: Emergency Medicine

## 2023-02-06 ENCOUNTER — Encounter (HOSPITAL_BASED_OUTPATIENT_CLINIC_OR_DEPARTMENT_OTHER): Payer: Self-pay | Admitting: Emergency Medicine

## 2023-02-06 ENCOUNTER — Other Ambulatory Visit: Payer: Self-pay

## 2023-02-06 DIAGNOSIS — R0981 Nasal congestion: Secondary | ICD-10-CM | POA: Insufficient documentation

## 2023-02-06 DIAGNOSIS — F0390 Unspecified dementia without behavioral disturbance: Secondary | ICD-10-CM | POA: Insufficient documentation

## 2023-02-06 DIAGNOSIS — R051 Acute cough: Secondary | ICD-10-CM | POA: Insufficient documentation

## 2023-02-06 DIAGNOSIS — Z20822 Contact with and (suspected) exposure to covid-19: Secondary | ICD-10-CM | POA: Diagnosis not present

## 2023-02-06 DIAGNOSIS — R059 Cough, unspecified: Secondary | ICD-10-CM | POA: Diagnosis present

## 2023-02-06 DIAGNOSIS — I509 Heart failure, unspecified: Secondary | ICD-10-CM | POA: Diagnosis not present

## 2023-02-06 LAB — CBC WITH DIFFERENTIAL/PLATELET
Abs Immature Granulocytes: 0.03 10*3/uL (ref 0.00–0.07)
Basophils Absolute: 0.1 10*3/uL (ref 0.0–0.1)
Basophils Relative: 1 %
Eosinophils Absolute: 0.1 10*3/uL (ref 0.0–0.5)
Eosinophils Relative: 1 %
HCT: 31.4 % — ABNORMAL LOW (ref 36.0–46.0)
Hemoglobin: 10.4 g/dL — ABNORMAL LOW (ref 12.0–15.0)
Immature Granulocytes: 0 %
Lymphocytes Relative: 17 %
Lymphs Abs: 1.4 10*3/uL (ref 0.7–4.0)
MCH: 27.7 pg (ref 26.0–34.0)
MCHC: 33.1 g/dL (ref 30.0–36.0)
MCV: 83.7 fL (ref 80.0–100.0)
Monocytes Absolute: 0.9 10*3/uL (ref 0.1–1.0)
Monocytes Relative: 10 %
Neutro Abs: 5.9 10*3/uL (ref 1.7–7.7)
Neutrophils Relative %: 71 %
Platelets: 288 10*3/uL (ref 150–400)
RBC: 3.75 MIL/uL — ABNORMAL LOW (ref 3.87–5.11)
RDW: 14.1 % (ref 11.5–15.5)
WBC: 8.5 10*3/uL (ref 4.0–10.5)
nRBC: 0 % (ref 0.0–0.2)

## 2023-02-06 LAB — COMPREHENSIVE METABOLIC PANEL
ALT: 5 U/L (ref 0–44)
AST: 9 U/L — ABNORMAL LOW (ref 15–41)
Albumin: 3 g/dL — ABNORMAL LOW (ref 3.5–5.0)
Alkaline Phosphatase: 85 U/L (ref 38–126)
Anion gap: 6 (ref 5–15)
BUN: 11 mg/dL (ref 8–23)
CO2: 29 mmol/L (ref 22–32)
Calcium: 8.5 mg/dL — ABNORMAL LOW (ref 8.9–10.3)
Chloride: 98 mmol/L (ref 98–111)
Creatinine, Ser: 0.46 mg/dL (ref 0.44–1.00)
GFR, Estimated: >60 mL/min (ref >60)
Glucose, Bld: 96 mg/dL (ref 70–99)
Potassium: 3.7 mmol/L (ref 3.5–5.1)
Sodium: 133 mmol/L — ABNORMAL LOW (ref 135–145)
Total Bilirubin: 0.3 mg/dL (ref 0.0–1.2)
Total Protein: 6.2 g/dL — ABNORMAL LOW (ref 6.5–8.1)

## 2023-02-06 LAB — TROPONIN I (HIGH SENSITIVITY)
Troponin I (High Sensitivity): 4 ng/L (ref <18)
Troponin I (High Sensitivity): 4 ng/L (ref <18)

## 2023-02-06 MED ORDER — IPRATROPIUM-ALBUTEROL 0.5-2.5 (3) MG/3ML IN SOLN
3.0000 mL | Freq: Once | RESPIRATORY_TRACT | Status: AC
  Administered 2023-02-06: 3 mL via RESPIRATORY_TRACT
  Filled 2023-02-06: qty 3

## 2023-02-06 NOTE — ED Provider Notes (Signed)
EMERGENCY DEPARTMENT AT [facility] Provider Note   CSN: [id number] Arrival date & time: 02/06/23  2041     History Chief Complaint  Patient presents with   Cough   Nasal Congestion    Stephanie Donovan is a 89 y.o. female with history of paroxysmal A-fib, CHF presents emerged from today for evaluation of cough.  Patient was brought in via EMS from SNF.  Patient currently has no complaints.  She reports she does not know why she is here.  She did have some coughing on exam which she reports she has had for the past few days.  Denies any runny nose, nasal congestion, chest pain, shortness of breath.  Per the nursing note from EMS, the patient requested to be here for her cough that she has had that she is already been treated with at that facility.  She has been on Mucinex for this cough.  She has listed allergies of aspirin, penicillins, and codeine.   Cough      Home Medications Prior to Admission medications   Medication Sig Start Date End Date Taking? Authorizing Provider  albuterol (VENTOLIN HFA) 108 (90 Base) MCG/ACT inhaler Inhale 2 puffs into the lungs every 6 (six) hours as needed for wheezing or shortness of breath. 06/11/21   Regalado, Belkys A, MD  brimonidine (ALPHAGAN P) 0.1 % SOLN Place 1 drop into the left eye in the morning and at bedtime.    [provider]  chlorproMAZINE (THORAZINE) 10 MG tablet Take 1 tablet (10 mg total) by mouth 3 (three) times daily as needed for vomiting or nausea. 06/11/21   Regalado, Belkys A, MD  ferrous sulfate 325 (65 FE) MG EC tablet Take 325 mg by mouth daily.    [provider]  furosemide (LASIX) 20 MG tablet Take 20 mg by mouth daily.    [provider]  levothyroxine (SYNTHROID) 50 MCG tablet Take 50 mcg by mouth daily before breakfast.    [provider]  magnesium oxide (MAG-OX) 400 (241.3 Mg) MG tablet Take 1 tablet (400 mg total) by mouth 2 (two) times daily. Patient taking  differently: Take 400 mg by mouth daily. 11/30/19   Regalado, Belkys A, MD  metoprolol succinate (TOPROL-XL) 25 MG 24 hr tablet Take 0.5 tablets (12.5 mg total) by mouth daily as needed (Give 12.5 mg if HR > 99 bpm). 06/11/21   Regalado, Belkys A, MD  Multiple Vitamin-Folic Acid TABS Take 1 tablet by mouth daily. 0.4 mg    [provider]  ondansetron (ZOFRAN-ODT) 4 MG disintegrating tablet 4mg  ODT q4 hours prn nausea/vomit 09/10/21   Charlynne Pander, MD  pantoprazole (PROTONIX) 40 MG tablet Take 1 tablet (40 mg total) by mouth daily. 06/12/21   Regalado, Belkys A, MD  timolol (TIMOPTIC) 0.25 % ophthalmic solution Place 1 drop into both eyes 2 (two) times daily. 12/22/19   [provider]  zinc sulfate 220 (50 Zn) MG capsule Take 1 capsule (220 mg total) by mouth daily. 06/11/21   Regalado, Belkys A, MD      Allergies    Aspirin, Penicillins, and Codeine    Review of Systems   Review of Systems  Unable to perform ROS: Dementia  Respiratory:  Positive for cough.     Physical Exam Updated Vital Signs BP (!) 147/85   Pulse 79   Temp 98.5 F (36.9 C) (Oral)   Resp 20   Ht 5' (1.524 m)   Wt 58.1 kg  SpO2 100%   BMI 25.00 kg/m  Physical Exam Vitals and nursing note reviewed.  Constitutional:      General: She is not in acute distress.    Appearance: She is not toxic-appearing.  HENT:     Mouth/Throat:     Mouth: Mucous membranes are moist.     Comments: Multiple missing teeth Eyes:     General: No scleral icterus. Cardiovascular:     Rate and Rhythm: Normal rate.  Pulmonary:     Effort: Pulmonary effort is normal. No respiratory distress.     Comments: Rales/rhonchi auscultated in the right lower lung.  Speaking in full sentences.  No accessory muscle use. Abdominal:     Palpations: Abdomen is soft.     Tenderness: There is no abdominal tenderness. There is no guarding or rebound.  Musculoskeletal:     Comments: AKA on the right  Skin:    General: Skin  is warm and dry.  Neurological:     Mental Status: She is alert.     ED Results / Procedures / Treatments   Labs (all labs ordered are listed, but only abnormal results are displayed) Labs Reviewed  CBC WITH DIFFERENTIAL/PLATELET - Abnormal; Notable for the following components:      Result Value   RBC 3.75 (*)    Hemoglobin 10.4 (*)    HCT 31.4 (*)    All other components within normal limits  COMPREHENSIVE METABOLIC PANEL - Abnormal; Notable for the following components:   Sodium 133 (*)    Calcium 8.5 (*)    Total Protein 6.2 (*)    Albumin 3.0 (*)    AST 9 (*)    All other components within normal limits  RESP PANEL BY RT-PCR (RSV, FLU A&B, COVID)  RVPGX2  PROCALCITONIN  TROPONIN I (HIGH SENSITIVITY)  TROPONIN I (HIGH SENSITIVITY)    EKG EKG Interpretation Date/Time:  Wednesday February 06 2023 21:06:15 EST Ventricular Rate:  81 PR Interval:  157 QRS Duration:  88 QT Interval:  386 QTC Calculation: 448 R Axis:   22  Text Interpretation: Sinus rhythm Low voltage, precordial leads Confirmed by Vonita Moss [phone]) on 02/06/2023 10:17:28 PM  Radiology DG Chest Port 1 View Result Date: 02/06/2023 CLINICAL DATA:  Cough EXAM: PORTABLE CHEST 1 VIEW COMPARISON:  12/05/2021 FINDINGS: Mild left basilar atelectasis.  No pleural effusion or pneumothorax. The heart is normal in size.  Thoracic aortic atherosclerosis. Right shoulder arthroplasty. Degenerative changes of the left shoulder. IMPRESSION: Mild left basilar atelectasis. Electronically Signed   By: Charline Bills M.D.   On: 02/06/2023 22:16    Procedures Procedures   Medications Ordered in ED Medications  ipratropium-albuterol (DUONEB) 0.5-2.5 (3) MG/3ML nebulizer solution 3 mL (has no administration in time range)  ipratropium-albuterol (DUONEB) 0.5-2.5 (3) MG/3ML nebulizer solution 3 mL (3 mLs Nebulization Given 02/06/23 2202)    ED Course/ Medical Decision Making/ A&P   Medical Decision Making Amount  and/or Complexity of Data Reviewed Labs: ordered. Radiology: ordered.  Risk Prescription drug management.   89 y.o. female presents to the ER for evaluation of cough. Differential diagnosis includes but is not limited to Upper respiratory infection, lower respiratory infection, allergies/irritants, asthma, foreign body, medications (ACE inhibitors), reflux, CHF, interstitial lung disease. Vital signs mildly elevated blood pressure, otherwise unremarkable. Physical exam as noted above.   I independently reviewed and interpreted the patient's labs.  Rester panel negative for COVID, flu, RSV.  Troponin within normal limits.  CMP shows mildly hyponatremia at  133.  Decrease in calcium, total protein, albumin, and AST.  Otherwise unremarkable.  CBC does show some anemia, appears to be around patient's baseline.  No leukocytosis.  EKG reviewed and interpreted by my attending and read as inus rhythm Low voltage, precordial leads .  Chest x-ray shows Mild left basilar atelectasis. Per radiologist's interpretation.    Patient currently has no complaints.  She does have dementia at baseline.  She does not appear in any acute distress.  She has no complaints lying on stretcher comfortably.  My attending assessed at bedside and agrees to discharge with antibiotics. She does have a cough with some questionable rhonchi/Rales.  Because of this, I have prescribed her some doxycycline.  Her coughing has improved with the DuoNebs.  Recall recommend that she follow-up with a primary care doctor in the next few days for reevaluation.  We discussed the results of the labs/imaging. The plan is take medications prescribed, follow-up with PCP. We discussed strict return precautions and red flag symptoms.  Return precautions were also listed in the discharge paperwork.  The patient is stable and being discharged home in good condition.  Portions of this report may have been transcribed using voice recognition software.  Every effort was made to ensure accuracy; however, inadvertent computerized transcription errors may be present.   I discussed this case with my attending physician who cosigned this note including patient's presenting symptoms, physical exam, and planned diagnostics and interventions. Attending physician stated agreement with plan or made changes to plan which were implemented.   Attending physician assessed patient at bedside.  Final Clinical Impression(s) / ED Diagnoses Final diagnoses:  Acute cough    Rx / DC Orders ED Discharge Orders          Ordered    doxycycline (VIBRAMYCIN) 100 MG capsule  2 times daily        02/07/23 0009              Achille Rich, PA-C 02/07/23 0157    Rondel Baton, MD 02/08/23 [phone]

## 2023-02-06 NOTE — ED Triage Notes (Signed)
 Pt to ED from [facility] via [facility] c/o cough and congestion.  Per EMS patient currently being treated for same with mucinex  and quifenison.  Pt requested to be sent out. VSS per EMS.   Pt denies pain or SOB, alert and oriented to self and town, unsure of year.

## 2023-02-07 LAB — PROCALCITONIN: Procalcitonin: <0.1 ng/mL

## 2023-02-07 LAB — RESP PANEL BY RT-PCR (RSV, FLU A&B, COVID)  RVPGX2
Influenza A by PCR: NEGATIVE
Influenza B by PCR: NEGATIVE
Resp Syncytial Virus by PCR: NEGATIVE
SARS Coronavirus 2 by RT PCR: NEGATIVE

## 2023-02-07 MED ORDER — DOXYCYCLINE HYCLATE 100 MG PO CAPS: 100.0000 mg | ORAL_CAPSULE | Freq: Two times a day (BID) | ORAL | 0 refills | Status: AC

## 2023-02-07 NOTE — Discharge Instructions (Addendum)
You were seen in the ER today for evaluation of your cough. Your Xrays are unchanged. Given your symptoms, I will place you on doxycycline to help with any infection. Please make sure you are followed closely by the PCP. If you have any concerns, new or worsening symptoms, please return to the nearest ER for re-evaluation.   Contact a doctor if: You have new symptoms. Your symptoms get worse. You cough up pus. You have a fever that does not go away. Your cough does not get better after 2-3 weeks. Cough medicine does not help, and you are not sleeping well. You have pain that gets worse or is not helped with medicine. You are losing weight and do not know why. You have night sweats. Get help right away if: You cough up blood. You have trouble breathing. Your heart is beating very fast. These symptoms may be an emergency. Get help right away. Call 911. Do not wait to see if the symptoms will go away. Do not drive yourself to the hospital.

## 2023-02-07 NOTE — ED Notes (Signed)
Pt removed IV from right AC.  IV catheter intact, bleeding controlled.

## 2023-02-14 ENCOUNTER — Emergency Department (HOSPITAL_BASED_OUTPATIENT_CLINIC_OR_DEPARTMENT_OTHER): Payer: Medicare Other | Admitting: Radiology

## 2023-02-14 ENCOUNTER — Other Ambulatory Visit: Payer: Self-pay

## 2023-02-14 ENCOUNTER — Emergency Department (HOSPITAL_BASED_OUTPATIENT_CLINIC_OR_DEPARTMENT_OTHER)
Admission: EM | Admit: 2023-02-14 | Discharge: 2023-02-14 | Disposition: A | Discharge status: Home / Self Care | Payer: Medicare Other | Attending: Emergency Medicine | Admitting: Emergency Medicine

## 2023-02-14 DIAGNOSIS — R051 Acute cough: Secondary | ICD-10-CM | POA: Diagnosis not present

## 2023-02-14 DIAGNOSIS — R059 Cough, unspecified: Secondary | ICD-10-CM | POA: Diagnosis present

## 2023-02-14 DIAGNOSIS — F0390 Unspecified dementia without behavioral disturbance: Secondary | ICD-10-CM | POA: Diagnosis not present

## 2023-02-14 DIAGNOSIS — Z20822 Contact with and (suspected) exposure to covid-19: Secondary | ICD-10-CM | POA: Diagnosis not present

## 2023-02-14 LAB — CBC WITH DIFFERENTIAL/PLATELET
Abs Immature Granulocytes: 0.03 10*3/uL (ref 0.00–0.07)
Basophils Absolute: 0.1 10*3/uL (ref 0.0–0.1)
Basophils Relative: 1 %
Eosinophils Absolute: 0.1 10*3/uL (ref 0.0–0.5)
Eosinophils Relative: 1 %
HCT: 36 % (ref 36.0–46.0)
Hemoglobin: 11.8 g/dL — ABNORMAL LOW (ref 12.0–15.0)
Immature Granulocytes: 0 %
Lymphocytes Relative: 13 %
Lymphs Abs: 1.1 10*3/uL (ref 0.7–4.0)
MCH: 27.6 pg (ref 26.0–34.0)
MCHC: 32.8 g/dL (ref 30.0–36.0)
MCV: 84.1 fL (ref 80.0–100.0)
Monocytes Absolute: 0.6 10*3/uL (ref 0.1–1.0)
Monocytes Relative: 7 %
Neutro Abs: 6.6 10*3/uL (ref 1.7–7.7)
Neutrophils Relative %: 78 %
Platelets: 300 10*3/uL (ref 150–400)
RBC: 4.28 MIL/uL (ref 3.87–5.11)
RDW: 14.4 % (ref 11.5–15.5)
WBC: 8.5 10*3/uL (ref 4.0–10.5)
nRBC: 0 % (ref 0.0–0.2)

## 2023-02-14 LAB — BASIC METABOLIC PANEL
Anion gap: 7 (ref 5–15)
BUN: 9 mg/dL (ref 8–23)
CO2: 32 mmol/L (ref 22–32)
Calcium: 8.5 mg/dL — ABNORMAL LOW (ref 8.9–10.3)
Chloride: 93 mmol/L — ABNORMAL LOW (ref 98–111)
Creatinine, Ser: 0.51 mg/dL (ref 0.44–1.00)
GFR, Estimated: >60 mL/min (ref >60)
Glucose, Bld: 94 mg/dL (ref 70–99)
Potassium: 3.5 mmol/L (ref 3.5–5.1)
Sodium: 132 mmol/L — ABNORMAL LOW (ref 135–145)

## 2023-02-14 LAB — RESP PANEL BY RT-PCR (RSV, FLU A&B, COVID)  RVPGX2
Influenza A by PCR: NEGATIVE
Influenza B by PCR: NEGATIVE
Resp Syncytial Virus by PCR: NEGATIVE
SARS Coronavirus 2 by RT PCR: NEGATIVE

## 2023-02-14 NOTE — ED Notes (Signed)
 Pt eating macaroni and cheese.

## 2023-02-14 NOTE — ED Notes (Signed)
Called [facility] for transport: 17:02

## 2023-02-14 NOTE — Discharge Instructions (Addendum)
Would like for you to follow-up with your primary care doctor for further evaluation.  You may return to the emergency department for any worsening symptoms.

## 2023-02-14 NOTE — ED Provider Notes (Signed)
[place] EMERGENCY DEPARTMENT AT [facility] Provider Note   CSN: [id number] Arrival date & time: 02/14/23  1242     History Chief Complaint  Patient presents with   Cough    Stephanie Donovan is a 89 y.o. female with history of dementia who presents to the emergency department for cough.  Patient currently has no complaints and is wondering why she is here.  She was sent from SNF.  I reviewed the medical record and she was seen in the emergency room on 02/06/2023 for similar symptoms.  She was placed on doxycycline and ultimately discharged.  Patient returns today.   Cough      Home Medications Prior to Admission medications   Medication Sig Start Date End Date Taking? Authorizing Provider  albuterol (VENTOLIN HFA) 108 (90 Base) MCG/ACT inhaler Inhale 2 puffs into the lungs every 6 (six) hours as needed for wheezing or shortness of breath. 06/11/21   Regalado, Belkys A, MD  brimonidine (ALPHAGAN P) 0.1 % SOLN Place 1 drop into the left eye in the morning and at bedtime.    [provider]  chlorproMAZINE (THORAZINE) 10 MG tablet Take 1 tablet (10 mg total) by mouth 3 (three) times daily as needed for vomiting or nausea. 06/11/21   Regalado, Belkys A, MD  doxycycline (VIBRAMYCIN) 100 MG capsule Take 1 capsule (100 mg total) by mouth 2 (two) times daily. 02/07/23   Achille Rich, PA-C  ferrous sulfate 325 (65 FE) MG EC tablet Take 325 mg by mouth daily.    [provider]  furosemide (LASIX) 20 MG tablet Take 20 mg by mouth daily.    [provider]  levothyroxine (SYNTHROID) 50 MCG tablet Take 50 mcg by mouth daily before breakfast.    [provider]  magnesium oxide (MAG-OX) 400 (241.3 Mg) MG tablet Take 1 tablet (400 mg total) by mouth 2 (two) times daily. Patient taking differently: Take 400 mg by mouth daily. 11/30/19   Regalado, Belkys A, MD  metoprolol succinate (TOPROL-XL) 25 MG 24 hr tablet Take 0.5 tablets (12.5 mg total) by  mouth daily as needed (Give 12.5 mg if HR > 99 bpm). 06/11/21   Regalado, Belkys A, MD  Multiple Vitamin-Folic Acid TABS Take 1 tablet by mouth daily. 0.4 mg    [provider]  ondansetron (ZOFRAN-ODT) 4 MG disintegrating tablet 4mg  ODT q4 hours prn nausea/vomit 09/10/21   Charlynne Pander, MD  pantoprazole (PROTONIX) 40 MG tablet Take 1 tablet (40 mg total) by mouth daily. 06/12/21   Regalado, Belkys A, MD  timolol (TIMOPTIC) 0.25 % ophthalmic solution Place 1 drop into both eyes 2 (two) times daily. 12/22/19   [provider]  zinc sulfate 220 (50 Zn) MG capsule Take 1 capsule (220 mg total) by mouth daily. 06/11/21   Regalado, Belkys A, MD      Allergies    Aspirin, Penicillins, and Codeine    Review of Systems   Review of Systems  Respiratory:  Positive for cough.   All other systems reviewed and are negative.   Physical Exam Updated Vital Signs BP (!) 145/74 (BP Location: Right Arm)   Pulse 87   Temp 97.8 F (36.6 C)   Resp 18   SpO2 97%  Physical Exam Vitals and nursing note reviewed.  Constitutional:      General: She is not in acute distress.    Appearance: Normal appearance.  HENT:     Head: Normocephalic and atraumatic.  Eyes:  General:        Right eye: No discharge.        Left eye: No discharge.  Cardiovascular:     Comments: Regular rate and rhythm.  S1/S2 are distinct without any evidence of murmur, rubs, or gallops.  Radial pulses are 2+ bilaterally.  Dorsalis pedis pulses are 2+ bilaterally.  No evidence of pedal edema. Pulmonary:     Comments: Rhonchi heard with expiratory breathing throughout lung fields. Abdominal:     General: Abdomen is flat. Bowel sounds are normal. There is no distension.     Tenderness: There is no abdominal tenderness. There is no guarding or rebound.  Musculoskeletal:        General: Normal range of motion.     Cervical back: Neck supple.  Skin:    General: Skin is warm and dry.     Findings: No rash.   Neurological:     General: No focal deficit present.     Mental Status: She is alert.  Psychiatric:        Mood and Affect: Mood normal.        Behavior: Behavior normal.     ED Results / Procedures / Treatments   Labs (all labs ordered are listed, but only abnormal results are displayed) Labs Reviewed  CBC WITH DIFFERENTIAL/PLATELET - Abnormal; Notable for the following components:      Result Value   Hemoglobin 11.8 (*)    All other components within normal limits  BASIC METABOLIC PANEL - Abnormal; Notable for the following components:   Sodium 132 (*)    Chloride 93 (*)    Calcium 8.5 (*)    All other components within normal limits  RESP PANEL BY RT-PCR (RSV, FLU A&B, COVID)  [id number]    EKG None  Radiology DG Chest 2 View Result Date: 02/14/2023 CLINICAL DATA:  Cough. EXAM: CHEST - 2 VIEW COMPARISON:  02/06/2023. FINDINGS: Low lung volume. Redemonstration of left retrocardiac opacity partially obscuring the left middle third hemidiaphragm. Bilateral lung fields are otherwise clear. Bilateral costophrenic angles are clear. Stable cardio-mediastinal silhouette. No acute osseous abnormalities. Right reverse shoulder arthroplasty noted. The soft tissues are within normal limits. IMPRESSION: *Persistent left retrocardiac opacity, which may represent atelectasis and/or consolidation. Correlate clinically. Electronically Signed   By: Jules Schick M.D.   On: 02/14/2023 14:12    Procedures Procedures    Medications Ordered in ED Medications - No data to display  ED Course/ Medical Decision Making/ A&P Clinical Course as of 02/14/23 1659  Thu Feb 14, 2023  1530 Resp panel by RT-PCR (RSV, Flu A&B, Covid) Anterior Nasal Swab Negative. [CF]  1625 CBC with Differential(!) Negative.  [CF]  1625 Basic metabolic panel(!) Mild hyponatremia and mild hypochloremia.  [CF]  1627 DG Chest 2 View I ordered interpreted the study.  I do not see any evidence of pneumonia.  I do agree  with radiologist interpretation. [CF]    Clinical Course User Index [CF] Teressa Lower, PA-C   {   Click here for ABCD2, HEART and other calculators  Medical Decision Making Stephanie Donovan is a 89 y.o. female patient who presents to the emergency department today for further evaluation of cough.  Patient currently has no complaints.  Will add on some basic labs.  Respiratory panel was negative and chest x-ray did not reveal any signs of pulmonary edema but there is questionable atelectasis versus infiltrate.  This seems to be consistent with the previous x-ray that was  done about a week ago.  Chest x-ray is equivocal.  Patient currently still on doxycycline I believe.  No need to change antibiotics at this point.  She has had no fever.  No leukocytosis.  Will plan to discharge home and have her follow-up with primary care doctor.  Strict return precautions were discussed. She is safe for discharge.  Amount and/or Complexity of Data Reviewed Labs: ordered. Decision-making details documented in ED Course. Radiology: ordered. Decision-making details documented in ED Course.    Final Clinical Impression(s) / ED Diagnoses Final diagnoses:  Acute cough    Rx / DC Orders ED Discharge Orders     None         Jolyn Lent 02/14/23 1659    Benjiman Core, MD 02/14/23 2105

## 2023-02-14 NOTE — ED Notes (Signed)
Care coordinator from [facility]) called and RN updated her. Chenelle states she has contacted patients daughter.

## 2023-02-14 NOTE — ED Notes (Signed)
Assisted to stretcher, peri/incontinence performed.

## 2023-02-14 NOTE — ED Triage Notes (Signed)
Pt bib [facility], reports cough, congestion with concern for covid or RSV

## 2023-11-29 ENCOUNTER — Other Ambulatory Visit: Payer: Self-pay

## 2023-11-29 ENCOUNTER — Emergency Department (HOSPITAL_COMMUNITY)
Admission: EM | Admit: 2023-11-29 | Discharge: 2023-11-29 | Disposition: A | Discharge status: Home / Self Care | Source: Skilled Nursing Facility | Attending: Emergency Medicine | Admitting: Emergency Medicine

## 2023-11-29 DIAGNOSIS — S61411A Laceration without foreign body of right hand, initial encounter: Secondary | ICD-10-CM | POA: Insufficient documentation

## 2023-11-29 DIAGNOSIS — X58XXXA Exposure to other specified factors, initial encounter: Secondary | ICD-10-CM | POA: Insufficient documentation

## 2023-11-29 DIAGNOSIS — F0390 Unspecified dementia without behavioral disturbance: Secondary | ICD-10-CM | POA: Diagnosis not present

## 2023-11-29 DIAGNOSIS — S6991XA Unspecified injury of right wrist, hand and finger(s), initial encounter: Secondary | ICD-10-CM | POA: Diagnosis present

## 2023-11-29 MED ORDER — CEPHALEXIN 500 MG PO CAPS
500.0000 mg | ORAL_CAPSULE | Freq: Once | ORAL | Status: AC
  Administered 2023-11-29: 500 mg via ORAL
  Filled 2023-11-29: qty 1

## 2023-11-29 MED ORDER — CEPHALEXIN 500 MG PO CAPS: 500.0000 mg | ORAL_CAPSULE | Freq: Two times a day (BID) | ORAL | 0 refills | Status: AC

## 2023-11-29 NOTE — ED Provider Notes (Signed)
 [place] EMERGENCY DEPARTMENT AT [facility] Provider Note   CSN: [id number] Arrival date & time: 11/29/23  1123     Patient presents with: Hand Injury   Stephanie Donovan is a 89 y.o. female.   HPI Pt bib EMS from [facility]. Skin tear on back of R hand. Pt reporting no pain from skin tear. No other trauma reported A&Ox1. Bleeding controlled. R Leg amputee wheelchair. Hx of Dementia BP 128/  HR 88  RR 20  99% RA    Prior to Admission medications   Medication Sig Start Date End Date Taking? Authorizing Provider  cephALEXin  (KEFLEX ) 500 MG capsule Take 1 capsule (500 mg total) by mouth 2 (two) times daily for 5 days. 11/29/23 12/04/23 Yes Garrick Charleston, MD  albuterol  (VENTOLIN  HFA) 108 [phone] Base) MCG/ACT inhaler Inhale 2 puffs into the lungs every 6 (six) hours as needed for wheezing or shortness of breath. 06/11/21   Regalado, Belkys A, MD  brimonidine  (ALPHAGAN  P) 0.1 % SOLN Place 1 drop into the left eye in the morning and at bedtime.    [provider]  chlorproMAZINE  (THORAZINE ) 10 MG tablet Take 1 tablet (10 mg total) by mouth 3 (three) times daily as needed for vomiting or nausea. 06/11/21   Regalado, Belkys A, MD  doxycycline  (VIBRAMYCIN ) 100 MG capsule Take 1 capsule (100 mg total) by mouth 2 (two) times daily. 02/07/23   Bernis Ernst, PA-C  ferrous sulfate  325 (65 FE) MG EC tablet Take 325 mg by mouth daily.    [provider]  furosemide  (LASIX ) 20 MG tablet Take 20 mg by mouth daily.    [provider]  levothyroxine  (SYNTHROID ) 50 MCG tablet Take 50 mcg by mouth daily before breakfast.    [provider]  magnesium  oxide (MAG-OX) 400 (241.3 Mg) MG tablet Take 1 tablet (400 mg total) by mouth 2 (two) times daily. Patient taking differently: Take 400 mg by mouth daily. 11/30/19   Regalado, Belkys A, MD  metoprolol  succinate (TOPROL -XL) 25 MG 24 hr tablet Take 0.5 tablets (12.5 mg total) by mouth daily as needed  (Give 12.5 mg if HR > 99 bpm). 06/11/21   Regalado, Belkys A, MD  Multiple Vitamin-Folic Acid TABS Take 1 tablet by mouth daily. 0.4 mg    [provider]  ondansetron  (ZOFRAN -ODT) 4 MG disintegrating tablet 4mg  ODT q4 hours prn nausea/vomit 09/10/21   Patt Alm Macho, MD  pantoprazole  (PROTONIX ) 40 MG tablet Take 1 tablet (40 mg total) by mouth daily. 06/12/21   Regalado, Belkys A, MD  timolol  (TIMOPTIC ) 0.25 % ophthalmic solution Place 1 drop into both eyes 2 (two) times daily. 12/22/19   [provider]  zinc  sulfate 220 (50 Zn) MG capsule Take 1 capsule (220 mg total) by mouth daily. 06/11/21   Regalado, Belkys A, MD    Allergies: Aspirin , Penicillins, and Codeine    Review of Systems  Updated Vital Signs Ht 1.524 m (5')   Wt 58.1 kg   BMI 25.02 kg/m   Physical Exam Vitals and nursing note reviewed.  Constitutional:      General: She is not in acute distress.    Appearance: She is well-developed.  HENT:     Head: Normocephalic and atraumatic.  Eyes:     Conjunctiva/sclera: Conjunctivae normal.  Cardiovascular:     Rate and Rhythm: Normal rate and regular rhythm.     Pulses: Normal pulses.  Pulmonary:     Effort: Pulmonary effort  is normal. No respiratory distress.     Breath sounds: Normal breath sounds. No stridor.  Abdominal:     General: There is no distension.  Musculoskeletal:     Comments: Status post right leg amputation. Dorsum of right hand with skin tear with subsequent visibility of tendon sheath which is not disrupted, has no visible abnormality.  Patient moves the digits appropriately, hand, wrist appropriately including using a spoon to feed herself.   Skin:    General: Skin is warm and dry.  Neurological:     Mental Status: She is alert.     Cranial Nerves: No cranial nerve deficit.  Psychiatric:        Cognition and Memory: Cognition is impaired. Memory is impaired.     (all labs ordered are listed, but only abnormal results are  displayed) Labs Reviewed - No data to display  EKG: None  Radiology: No results found.   Procedures   Medications Ordered in the ED  cephALEXin  (KEFLEX ) capsule 500 mg (has no administration in time range)                                    Medical Decision Making Elderly female presents after sustaining a skin tear at her facility.  Patient is awake, alert, has dementia which is prohibitive for history gathering. However, at her initial physical labs reassuring, consistent with prior aside from an injury. Patient with skin tear sufficient for visualization of deep structures, requiring cleaning, closure.  Amount and/or Complexity of Data Reviewed Independent Historian: EMS  Risk Decision regarding hospitalization. Diagnosis or treatment significantly limited by social determinants of health.   LACERATION REPAIR Performed by: Lamar Salen Authorized by: Lamar Salen Consent: Verbal consent obtained. Risks and benefits: risks, benefits and alternatives were discussed Consent given by: patient Patient identity confirmed: provided demographic data Prepped and Draped in normal sterile fashion Wound explored  Laceration Location: R dorsum of hand  Laceration Length: 12cm  No Foreign Bodies seen or palpated    Irrigation method: syringe after soaking with iodine Amount of cleaning: standard  Skin closure: Derma clip Steri-Strip/  Number of 6 devices  Technique: As close as possible to approximate skin tear.  Skin is very tenuous.  Bulky dressing placed subsequently.  Patient tolerance: Patient tolerated the procedure well with no immediate complications.      Final diagnoses:  Injury of right hand, initial encounter    ED Discharge Orders          Ordered    cephALEXin  (KEFLEX ) 500 MG capsule  2 times daily        11/29/23 1423               Salen Lamar, MD 11/29/23 1424

## 2023-11-29 NOTE — ED Notes (Signed)
 Placed iodine soaked gauze on wound to soak. Pt was non-compliant with placing hand into basin

## 2023-11-29 NOTE — ED Notes (Signed)
 Pt given sandwich

## 2023-11-29 NOTE — ED Notes (Signed)
 [facility] called

## 2023-11-29 NOTE — Discharge Instructions (Signed)
 You are dressing should stay in place on your hand wound for 2 days.  You may then remove it, and replace it with a similar bulky dressing.  Return here for concerning changes in your condition.  The wound closure devices will fall off when the time is appropriate typically in 7 to 10 days.

## 2023-11-29 NOTE — ED Triage Notes (Signed)
 Pt bib EMS from [facility]. Skin tear on back of R hand. Pt reporting no pain from skin tear. No other trauma reported A&Ox1. Bleeding controlled. R Leg amputee wheelchair. Hx of Dementia BP 128/  HR 88  RR 20  99% RA

## 2023-11-29 NOTE — ED Notes (Signed)
Called report to [facility]
